# Patient Record
Sex: Male | Born: 1955 | Race: White | Hispanic: No | Marital: Single
Health system: Southern US, Academic
[De-identification: ages and names within clinical notes are randomized; demographics above are authoritative.]

## PROBLEM LIST (undated history)

## (undated) ENCOUNTER — Encounter
Attending: Student in an Organized Health Care Education/Training Program | Primary: Student in an Organized Health Care Education/Training Program

## (undated) ENCOUNTER — Telehealth

## (undated) ENCOUNTER — Ambulatory Visit: Payer: MEDICARE

## (undated) ENCOUNTER — Encounter: Attending: Hematology & Oncology | Primary: Hematology & Oncology

## (undated) ENCOUNTER — Ambulatory Visit

## (undated) ENCOUNTER — Encounter

## (undated) ENCOUNTER — Encounter: Attending: Adult Health | Primary: Adult Health

## (undated) ENCOUNTER — Ambulatory Visit: Payer: MEDICARE | Attending: Pharmacist | Primary: Pharmacist

## (undated) ENCOUNTER — Inpatient Hospital Stay

## (undated) ENCOUNTER — Telehealth: Attending: Dermatology | Primary: Dermatology

## (undated) ENCOUNTER — Telehealth: Attending: Internal Medicine | Primary: Internal Medicine

## (undated) ENCOUNTER — Telehealth: Attending: Registered" | Primary: Registered"

## (undated) ENCOUNTER — Telehealth: Attending: Pharmacist | Primary: Pharmacist

## (undated) ENCOUNTER — Ambulatory Visit
Attending: Student in an Organized Health Care Education/Training Program | Primary: Student in an Organized Health Care Education/Training Program

## (undated) ENCOUNTER — Encounter: Attending: Pharmacist | Primary: Pharmacist

## (undated) ENCOUNTER — Telehealth
Attending: Student in an Organized Health Care Education/Training Program | Primary: Student in an Organized Health Care Education/Training Program

## (undated) ENCOUNTER — Telehealth: Attending: Hematology & Oncology | Primary: Hematology & Oncology

## (undated) ENCOUNTER — Institutional Professional Consult (permissible substitution): Payer: MEDICARE | Attending: Pharmacist | Primary: Pharmacist

## (undated) ENCOUNTER — Ambulatory Visit: Payer: MEDICARE | Attending: Dermatology | Primary: Dermatology

## (undated) DIAGNOSIS — R0600 Dyspnea, unspecified: Secondary | ICD-10-CM

## (undated) DIAGNOSIS — F32A Depression, unspecified: Secondary | ICD-10-CM

## (undated) DIAGNOSIS — G4730 Sleep apnea, unspecified: Secondary | ICD-10-CM

## (undated) DIAGNOSIS — L409 Psoriasis, unspecified: Secondary | ICD-10-CM

## (undated) DIAGNOSIS — I10 Essential (primary) hypertension: Secondary | ICD-10-CM

## (undated) DIAGNOSIS — I519 Heart disease, unspecified: Secondary | ICD-10-CM

## (undated) DIAGNOSIS — F329 Major depressive disorder, single episode, unspecified: Secondary | ICD-10-CM

## (undated) DIAGNOSIS — M1990 Unspecified osteoarthritis, unspecified site: Secondary | ICD-10-CM

## (undated) DIAGNOSIS — J449 Chronic obstructive pulmonary disease, unspecified: Secondary | ICD-10-CM

## (undated) DIAGNOSIS — I219 Acute myocardial infarction, unspecified: Secondary | ICD-10-CM

## (undated) DIAGNOSIS — I2510 Atherosclerotic heart disease of native coronary artery without angina pectoris: Secondary | ICD-10-CM

## (undated) DIAGNOSIS — K219 Gastro-esophageal reflux disease without esophagitis: Secondary | ICD-10-CM

## (undated) DIAGNOSIS — G43909 Migraine, unspecified, not intractable, without status migrainosus: Secondary | ICD-10-CM

## (undated) DIAGNOSIS — J45909 Unspecified asthma, uncomplicated: Secondary | ICD-10-CM

## (undated) HISTORY — PX: KNEE SURGERY: SHX244

## (undated) HISTORY — PX: HEMORRHOID SURGERY: SHX153

## (undated) HISTORY — DX: Atherosclerotic heart disease of native coronary artery without angina pectoris: I25.10

## (undated) HISTORY — PX: OTHER SURGICAL HISTORY: SHX169

## (undated) HISTORY — PX: CHOLECYSTECTOMY: SHX55

## (undated) HISTORY — DX: Psoriasis, unspecified: L40.9

## (undated) HISTORY — DX: Depression, unspecified: F32.A

## (undated) HISTORY — PX: TONSILLECTOMY: SUR1361

## (undated) HISTORY — PX: STENT PLACEMENT VASCULAR (ARMC HX): HXRAD1737

## (undated) HISTORY — PX: EYE SURGERY: SHX253

## (undated) HISTORY — DX: Unspecified osteoarthritis, unspecified site: M19.90

## (undated) HISTORY — PX: TONSILLECTOMY AND ADENOIDECTOMY: SHX28

## (undated) HISTORY — PX: CORONARY ANGIOPLASTY WITH STENT PLACEMENT: SHX49

## (undated) HISTORY — DX: Chronic obstructive pulmonary disease, unspecified: J44.9

## (undated) HISTORY — DX: Major depressive disorder, single episode, unspecified: F32.9

## (undated) HISTORY — DX: Heart disease, unspecified: I51.9

## (undated) HISTORY — PX: APPENDECTOMY: SHX54

## (undated) HISTORY — PX: CATARACT EXTRACTION: SUR2

---
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
## 2006-06-27 ENCOUNTER — Emergency Department: Payer: Self-pay | Admitting: Emergency Medicine

## 2006-07-02 ENCOUNTER — Emergency Department: Payer: Self-pay | Admitting: Emergency Medicine

## 2006-07-16 ENCOUNTER — Emergency Department: Payer: Self-pay | Admitting: Emergency Medicine

## 2006-11-15 ENCOUNTER — Ambulatory Visit: Payer: Self-pay

## 2006-12-09 ENCOUNTER — Emergency Department: Payer: Self-pay

## 2006-12-09 ENCOUNTER — Other Ambulatory Visit: Payer: Self-pay

## 2007-02-11 ENCOUNTER — Emergency Department: Payer: Self-pay | Admitting: Emergency Medicine

## 2007-05-01 ENCOUNTER — Emergency Department: Payer: Self-pay | Admitting: Emergency Medicine

## 2007-08-11 ENCOUNTER — Emergency Department: Payer: Self-pay | Admitting: Emergency Medicine

## 2008-01-23 ENCOUNTER — Other Ambulatory Visit: Payer: Self-pay

## 2008-01-23 ENCOUNTER — Emergency Department: Payer: Self-pay | Admitting: Emergency Medicine

## 2008-02-12 ENCOUNTER — Emergency Department: Payer: Self-pay | Admitting: Emergency Medicine

## 2008-06-26 ENCOUNTER — Emergency Department: Payer: Self-pay | Admitting: Emergency Medicine

## 2008-12-13 ENCOUNTER — Emergency Department: Payer: Self-pay | Admitting: Emergency Medicine

## 2010-04-09 ENCOUNTER — Emergency Department: Payer: Self-pay | Admitting: Emergency Medicine

## 2010-05-04 ENCOUNTER — Emergency Department: Payer: Self-pay | Admitting: Emergency Medicine

## 2010-06-09 DIAGNOSIS — M1990 Unspecified osteoarthritis, unspecified site: Secondary | ICD-10-CM | POA: Insufficient documentation

## 2010-06-09 DIAGNOSIS — R109 Unspecified abdominal pain: Secondary | ICD-10-CM | POA: Insufficient documentation

## 2010-06-09 DIAGNOSIS — E119 Type 2 diabetes mellitus without complications: Secondary | ICD-10-CM | POA: Insufficient documentation

## 2010-06-09 DIAGNOSIS — J45909 Unspecified asthma, uncomplicated: Secondary | ICD-10-CM | POA: Insufficient documentation

## 2010-06-09 DIAGNOSIS — G44309 Post-traumatic headache, unspecified, not intractable: Secondary | ICD-10-CM | POA: Insufficient documentation

## 2010-06-09 DIAGNOSIS — R197 Diarrhea, unspecified: Secondary | ICD-10-CM | POA: Insufficient documentation

## 2010-09-22 DIAGNOSIS — D126 Benign neoplasm of colon, unspecified: Secondary | ICD-10-CM | POA: Insufficient documentation

## 2010-10-24 ENCOUNTER — Emergency Department (HOSPITAL_COMMUNITY)
Admission: EM | Admit: 2010-10-24 | Discharge: 2010-10-24 | Disposition: A | Discharge status: Home / Self Care | Payer: Medicare Other | Attending: Emergency Medicine | Admitting: Emergency Medicine

## 2010-10-24 ENCOUNTER — Emergency Department (HOSPITAL_COMMUNITY): Payer: Medicare Other

## 2010-10-24 DIAGNOSIS — R109 Unspecified abdominal pain: Secondary | ICD-10-CM | POA: Insufficient documentation

## 2010-10-24 DIAGNOSIS — R143 Flatulence: Secondary | ICD-10-CM | POA: Insufficient documentation

## 2010-10-24 DIAGNOSIS — I10 Essential (primary) hypertension: Secondary | ICD-10-CM | POA: Insufficient documentation

## 2010-10-24 DIAGNOSIS — R142 Eructation: Secondary | ICD-10-CM | POA: Insufficient documentation

## 2010-10-24 DIAGNOSIS — Z79899 Other long term (current) drug therapy: Secondary | ICD-10-CM | POA: Insufficient documentation

## 2010-10-24 DIAGNOSIS — E119 Type 2 diabetes mellitus without complications: Secondary | ICD-10-CM | POA: Insufficient documentation

## 2010-10-24 DIAGNOSIS — R141 Gas pain: Secondary | ICD-10-CM | POA: Insufficient documentation

## 2010-10-24 LAB — CBC
HCT: 45.2 % (ref 39.0–52.0)
MCH: 31.5 pg (ref 26.0–34.0)
MCHC: 33.4 g/dL (ref 30.0–36.0)
MCV: 94.2 fL (ref 78.0–100.0)
Platelets: 234 10*3/uL (ref 150–400)
RDW: 13.4 % (ref 11.5–15.5)

## 2010-10-24 LAB — COMPREHENSIVE METABOLIC PANEL
AST: 15 U/L (ref 0–37)
Albumin: 3.9 g/dL (ref 3.5–5.2)
BUN: 12 mg/dL (ref 6–23)
Calcium: 9.2 mg/dL (ref 8.4–10.5)
Creatinine, Ser: 0.79 mg/dL (ref 0.50–1.35)
Total Bilirubin: 0.3 mg/dL (ref 0.3–1.2)
Total Protein: 7.3 g/dL (ref 6.0–8.3)

## 2010-10-24 LAB — DIFFERENTIAL
Eosinophils Absolute: 0.2 10*3/uL (ref 0.0–0.7)
Eosinophils Relative: 2 % (ref 0–5)
Lymphocytes Relative: 25 % (ref 12–46)
Lymphs Abs: 2.3 10*3/uL (ref 0.7–4.0)
Monocytes Absolute: 0.6 10*3/uL (ref 0.1–1.0)

## 2010-10-24 LAB — URINALYSIS, ROUTINE W REFLEX MICROSCOPIC
Bilirubin Urine: NEGATIVE
Nitrite: NEGATIVE
Protein, ur: NEGATIVE mg/dL
Specific Gravity, Urine: 1.026 (ref 1.005–1.030)
Urobilinogen, UA: 0.2 mg/dL (ref 0.0–1.0)

## 2010-10-24 LAB — LIPASE, BLOOD: Lipase: 33 U/L (ref 11–59)

## 2010-10-24 LAB — URINE MICROSCOPIC-ADD ON

## 2011-06-25 ENCOUNTER — Emergency Department: Payer: Self-pay | Admitting: *Deleted

## 2011-06-25 LAB — URINALYSIS, COMPLETE
Glucose,UR: NEGATIVE mg/dL (ref 0–75)
Ketone: NEGATIVE
Nitrite: NEGATIVE
Ph: 6 (ref 4.5–8.0)
Protein: NEGATIVE
RBC,UR: 10 /HPF (ref 0–5)
WBC UR: 1 /HPF (ref 0–5)

## 2011-06-25 LAB — COMPREHENSIVE METABOLIC PANEL
BUN: 7 mg/dL (ref 7–18)
Bilirubin,Total: 0.4 mg/dL (ref 0.2–1.0)
Creatinine: 0.9 mg/dL (ref 0.60–1.30)
EGFR (African American): >60
EGFR (Non-African Amer.): >60
Osmolality: 275 (ref 275–301)
Total Protein: 7.6 g/dL (ref 6.4–8.2)

## 2011-06-25 LAB — CBC
HCT: 43.6 % (ref 40.0–52.0)
HGB: 14.3 g/dL (ref 13.0–18.0)
MCHC: 32.7 g/dL (ref 32.0–36.0)
MCV: 94 fL (ref 80–100)
Platelet: 197 10*3/uL (ref 150–440)
RDW: 15 % — ABNORMAL HIGH (ref 11.5–14.5)

## 2011-08-18 ENCOUNTER — Encounter (HOSPITAL_COMMUNITY): Payer: Self-pay | Admitting: *Deleted

## 2011-08-18 ENCOUNTER — Emergency Department (HOSPITAL_COMMUNITY)
Admission: EM | Admit: 2011-08-18 | Discharge: 2011-08-18 | Disposition: A | Discharge status: Home / Self Care | Payer: Medicare Other | Attending: Emergency Medicine | Admitting: Emergency Medicine

## 2011-08-18 DIAGNOSIS — E119 Type 2 diabetes mellitus without complications: Secondary | ICD-10-CM | POA: Insufficient documentation

## 2011-08-18 DIAGNOSIS — H9201 Otalgia, right ear: Secondary | ICD-10-CM

## 2011-08-18 DIAGNOSIS — H9209 Otalgia, unspecified ear: Secondary | ICD-10-CM | POA: Insufficient documentation

## 2011-08-18 MED ORDER — HYDROCODONE-ACETAMINOPHEN 5-500 MG PO TABS: 1.0000 | ORAL_TABLET | Freq: Four times a day (QID) | ORAL | Status: AC | PRN

## 2011-08-18 NOTE — ED Notes (Signed)
Reports right ear infection, which he has taken antibiotics x 2 for with no relief and its now causing dizziness. No acute distress noted at triage

## 2011-08-18 NOTE — ED Provider Notes (Signed)
History     CSN: [id number]  Arrival date & time 08/18/11  1314   First MD Initiated Contact with Patient 08/18/11 1528      Chief Complaint  Patient presents with  . Otalgia  . Dizziness    (Consider location/radiation/quality/duration/timing/severity/associated sxs/prior treatment) Patient is a 56 y.o. male presenting with ear pain. The history is provided by the patient.  Otalgia This is a new problem. The current episode started more than 1 week ago. There is pain in the right ear. The problem occurs constantly. The problem has not changed since onset.There has been no fever. The pain is at a severity of 8/10. The pain is moderate. Associated symptoms include headaches. Pertinent negatives include no ear discharge, no rhinorrhea, no sore throat, no cough and no rash.  Pt states he has had right ear pain for 3 wks now. Has finished a course of neopmycin ear drops, orla ceftin and amoxicillin, states no improvement. Pain continues. Denies fever, chills, drainage, states hearing affected but still can hear out of that ear. States started feeling dizzy as well. No injury to the ear. No pain on the outside of the ear.   Past Medical History  Diagnosis Date  . Diabetes mellitus     No past surgical history on file.  No family history on file.  History  Substance Use Topics  . Smoking status: Not on file  . Smokeless tobacco: Not on file  . Alcohol Use:       Review of Systems  Constitutional: Negative for fever and chills.  HENT: Positive for ear pain. Negative for sore throat, rhinorrhea and ear discharge.   Eyes: Positive for pain.  Respiratory: Negative for cough.   Cardiovascular: Negative.   Gastrointestinal: Negative.   Musculoskeletal: Negative.   Skin: Negative for rash.  Neurological: Positive for headaches.    Allergies  Review of patient's allergies indicates no known allergies.  Home Medications   Current Outpatient Rx  Name Route Sig Dispense Refill   . ALBUTEROL SULFATE HFA 108 (90 BASE) MCG/ACT IN AERS Inhalation Inhale 2 puffs into the lungs every 4 (four) hours as needed. For shortness of breath    . ASPIRIN 325 MG PO TABS Oral Take 325 mg by mouth daily.    . ATORVASTATIN CALCIUM 20 MG PO TABS Oral Take 20 mg by mouth daily.    Marland Kitchen BENAZEPRIL HCL 20 MG PO TABS Oral Take 20 mg by mouth daily.    . BUDESONIDE-FORMOTEROL FUMARATE 80-4.5 MCG/ACT IN AERO Inhalation Inhale 2 puffs into the lungs 2 (two) times daily.    Marland Kitchen CITALOPRAM HYDROBROMIDE 20 MG PO TABS Oral Take 20 mg by mouth daily.    Marland Kitchen CLOPIDOGREL BISULFATE 75 MG PO TABS Oral Take 75 mg by mouth daily.    . IBUPROFEN 600 MG PO TABS Oral Take 600 mg by mouth every 6 (six) hours as needed. For pain    . ISOSORBIDE MONONITRATE ER 30 MG PO TB24 Oral Take 30 mg by mouth daily.    Marland Kitchen METOPROLOL TARTRATE 25 MG PO TABS Oral Take 25 mg by mouth 2 (two) times daily.    Marland Kitchen NITROGLYCERIN 0.4 MG SL SUBL Sublingual Place 0.4 mg under the tongue every 5 (five) minutes as needed. For chest pain    . TRIAMCINOLONE ACETONIDE 0.1 % EX CREA Topical Apply 1 application topically 2 (two) times daily.      BP 140/65  Pulse 92  Temp(Src) 98.1 F (36.7 C) (Oral)  Resp 16  SpO2 96%  Physical Exam  Nursing note and vitals reviewed. Constitutional: He is oriented to person, place, and time. He appears well-developed and well-nourished. No distress.  HENT:  Head: Normocephalic and atraumatic.  Right Ear: External ear and ear canal normal. Tympanic membrane is injected and erythematous.  Left Ear: Tympanic membrane, external ear and ear canal normal.  Nose: Nose normal.  Mouth/Throat: Uvula is midline.  Eyes: Conjunctivae are normal. Pupils are equal, round, and reactive to light.  Neck: Neck supple.  Cardiovascular: Normal rate, regular rhythm and normal heart sounds.   Pulmonary/Chest: Effort normal and breath sounds normal. No respiratory distress. He has no wheezes.  Musculoskeletal: Normal range of  motion.  Neurological: He is alert and oriented to person, place, and time. He displays normal reflexes. No cranial nerve deficit. Coordination normal.       Normal coordination. Negative romberg  Skin: Skin is warm and dry.  Psychiatric: He has a normal mood and affect.    ED Course  Procedures (including critical care time)  Pt with chronic right otitis media. Has finished topical and two rounds of antibiotics. Pt otherwise nontoxic. Normal neurological exam. I believe pt needs to be seen by an ENT for further evaluation. No signs of mastoiditis. Will d/c with ent referral   1. Right ear pain       MDM          Lottie Mussel, PA 08/18/11 1555

## 2011-08-18 NOTE — Discharge Instructions (Signed)
Ibuprofen for pain. Vicodin for severe pain as prescribed. Call number given for ENT appointment. Follow up with them. Tell them you were referred from Emergency Department.   Otalgia The most common reason for this in children is an infection of the middle ear. Pain from the middle ear is usually caused by a build-up of fluid and pressure behind the eardrum. Pain from an earache can be sharp, dull, or burning. The pain may be temporary or constant. The middle ear is connected to the nasal passages by a short narrow tube called the Eustachian tube. The Eustachian tube allows fluid to drain out of the middle ear, and helps keep the pressure in your ear equalized. CAUSES  A cold or allergy can block the Eustachian tube with inflammation and the build-up of secretions. This is especially likely in small children, because their Eustachian tube is shorter and more horizontal. When the Eustachian tube closes, the normal flow of fluid from the middle ear is stopped. Fluid can accumulate and cause stuffiness, pain, hearing loss, and an ear infection if germs start growing in this area. SYMPTOMS  The symptoms of an ear infection may include fever, ear pain, fussiness, increased crying, and irritability. Many children will have temporary and minor hearing loss during and right after an ear infection. Permanent hearing loss is rare, but the risk increases the more infections a child has. Other causes of ear pain include retained water in the outer ear canal from swimming and bathing. Ear pain in adults is less likely to be from an ear infection. Ear pain may be referred from other locations. Referred pain may be from the joint between your jaw and the skull. It may also come from a tooth problem or problems in the neck. Other causes of ear pain include:  A foreign body in the ear.   Outer ear infection.   Sinus infections.   Impacted ear wax.   Ear injury.   Arthritis of the jaw or TMJ problems.   Middle  ear infection.   Tooth infections.   Sore throat with pain to the ears.  DIAGNOSIS  Your caregiver can usually make the diagnosis by examining you. Sometimes other special studies, including x-rays and lab work may be necessary. TREATMENT   If antibiotics were prescribed, use them as directed and finish them even if you or your child's symptoms seem to be improved.   Sometimes PE tubes are needed in children. These are little plastic tubes which are put into the eardrum during a simple surgical procedure. They allow fluid to drain easier and allow the pressure in the middle ear to equalize. This helps relieve the ear pain caused by pressure changes.  HOME CARE INSTRUCTIONS   Only take over-the-counter or prescription medicines for pain, discomfort, or fever as directed by your caregiver. DO NOT GIVE CHILDREN ASPIRIN because of the association of Reye's Syndrome in children taking aspirin.   Use a cold pack applied to the outer ear for 15 to 20 minutes, 3 to 4 times per day or as needed may reduce pain. Do not apply ice directly to the skin. You may cause frost bite.   Over-the-counter ear drops used as directed may be effective. Your caregiver may sometimes prescribe ear drops.   Resting in an upright position may help reduce pressure in the middle ear and relieve pain.   Ear pain caused by rapidly descending from high altitudes can be relieved by swallowing or chewing gum. Allowing infants to suck  on a bottle during airplane travel can help.   Do not smoke in the house or near children. If you are unable to quit smoking, smoke outside.   Control allergies.  SEEK IMMEDIATE MEDICAL CARE IF:   You or your child are becoming sicker.   Pain or fever relief is not obtained with medicine.   You or your child's symptoms (pain, fever, or irritability) do not improve within 24 to 48 hours or as instructed.   Severe pain suddenly stops hurting. This may indicate a ruptured eardrum.   You  or your children develop new problems such as severe headaches, stiff neck, difficulty swallowing, or swelling of the face or around the ear.  Document Released: 12/10/2003 Document Revised: 04/13/2011 Document Reviewed: 04/15/2008 [facility] Patient Information 2012 [place], [place].

## 2011-08-18 NOTE — ED Provider Notes (Signed)
Medical screening examination/treatment/procedure(s) were performed by non-physician practitioner and as supervising physician I was immediately available for consultation/collaboration.   Benny Lennert, MD 08/18/11 [phone]

## 2011-09-06 ENCOUNTER — Emergency Department: Payer: Self-pay

## 2011-09-13 ENCOUNTER — Ambulatory Visit: Payer: Self-pay | Admitting: Otolaryngology

## 2011-12-23 ENCOUNTER — Emergency Department: Payer: Self-pay | Admitting: Unknown Physician Specialty

## 2011-12-23 LAB — CBC WITH DIFFERENTIAL/PLATELET
Basophil %: 0.8 %
HCT: 46.2 % (ref 40.0–52.0)
HGB: 15 g/dL (ref 13.0–18.0)
Lymphocyte %: 23.8 %
MCH: 30.3 pg (ref 26.0–34.0)
MCHC: 32.4 g/dL (ref 32.0–36.0)
Monocyte #: 0.8 x10 3/mm (ref 0.2–1.0)
Neutrophil #: 9.7 10*3/uL — ABNORMAL HIGH (ref 1.4–6.5)

## 2012-03-22 ENCOUNTER — Emergency Department: Payer: Self-pay | Admitting: Emergency Medicine

## 2012-07-05 DIAGNOSIS — L408 Other psoriasis: Secondary | ICD-10-CM | POA: Insufficient documentation

## 2012-07-05 DIAGNOSIS — J309 Allergic rhinitis, unspecified: Secondary | ICD-10-CM | POA: Insufficient documentation

## 2012-07-08 DIAGNOSIS — Z8249 Family history of ischemic heart disease and other diseases of the circulatory system: Secondary | ICD-10-CM | POA: Insufficient documentation

## 2012-08-02 DIAGNOSIS — I10 Essential (primary) hypertension: Secondary | ICD-10-CM | POA: Insufficient documentation

## 2012-08-02 DIAGNOSIS — J449 Chronic obstructive pulmonary disease, unspecified: Secondary | ICD-10-CM | POA: Insufficient documentation

## 2012-08-02 DIAGNOSIS — J4489 Other specified chronic obstructive pulmonary disease: Secondary | ICD-10-CM | POA: Insufficient documentation

## 2012-08-02 DIAGNOSIS — F329 Major depressive disorder, single episode, unspecified: Secondary | ICD-10-CM | POA: Insufficient documentation

## 2012-09-02 DIAGNOSIS — I2510 Atherosclerotic heart disease of native coronary artery without angina pectoris: Secondary | ICD-10-CM | POA: Insufficient documentation

## 2012-09-02 DIAGNOSIS — H9209 Otalgia, unspecified ear: Secondary | ICD-10-CM | POA: Insufficient documentation

## 2012-09-25 DIAGNOSIS — H60399 Other infective otitis externa, unspecified ear: Secondary | ICD-10-CM | POA: Insufficient documentation

## 2012-12-12 DIAGNOSIS — R079 Chest pain, unspecified: Secondary | ICD-10-CM | POA: Insufficient documentation

## 2012-12-12 DIAGNOSIS — R0600 Dyspnea, unspecified: Secondary | ICD-10-CM | POA: Insufficient documentation

## 2013-04-02 DIAGNOSIS — E782 Mixed hyperlipidemia: Secondary | ICD-10-CM | POA: Insufficient documentation

## 2013-04-02 DIAGNOSIS — I10 Essential (primary) hypertension: Secondary | ICD-10-CM | POA: Insufficient documentation

## 2014-02-09 DIAGNOSIS — R609 Edema, unspecified: Secondary | ICD-10-CM | POA: Insufficient documentation

## 2014-05-18 DIAGNOSIS — I10 Essential (primary) hypertension: Secondary | ICD-10-CM | POA: Insufficient documentation

## 2014-09-15 LAB — HM HIV SCREENING LAB: HM HIV SCREENING: NEGATIVE

## 2016-01-31 DIAGNOSIS — I208 Other forms of angina pectoris: Secondary | ICD-10-CM | POA: Insufficient documentation

## 2016-11-05 ENCOUNTER — Emergency Department
Admission: EM | Admit: 2016-11-05 | Discharge: 2016-11-05 | Disposition: A | Discharge status: Home / Self Care | Source: Intra-hospital

## 2016-11-05 MED ORDER — BUTALBITAL-ACETAMINOPHEN-CAFFEINE 50 MG-300 MG-40 MG CAPSULE
ORAL_CAPSULE | Freq: Four times a day (QID) | ORAL | 0 refills | 0 days | Status: CP | PRN
Start: 2016-11-05 — End: ?

## 2016-11-09 ENCOUNTER — Emergency Department
Admission: EM | Admit: 2016-11-09 | Discharge: 2016-11-09 | Disposition: A | Discharge status: Home / Self Care | Payer: MEDICARE | Source: Intra-hospital

## 2016-11-09 ENCOUNTER — Emergency Department
Admission: EM | Admit: 2016-11-09 | Discharge: 2016-11-09 | Disposition: A | Discharge status: Home / Self Care | Source: Intra-hospital

## 2016-11-09 DIAGNOSIS — R079 Chest pain, unspecified: Principal | ICD-10-CM

## 2016-11-09 MED ORDER — PREDNISONE 20 MG TABLET
ORAL_TABLET | Freq: Every day | ORAL | 0 refills | 0.00000 days | Status: CP
Start: 2016-11-09 — End: 2016-11-16

## 2016-11-09 MED ORDER — AZITHROMYCIN 250 MG TABLET
PACK | Freq: Every day | ORAL | 0 refills | 0.00000 days | Status: CP
Start: 2016-11-09 — End: 2016-11-14

## 2016-11-17 ENCOUNTER — Emergency Department
Admission: EM | Admit: 2016-11-17 | Discharge: 2016-11-17 | Disposition: A | Discharge status: Home / Self Care | Payer: MEDICARE | Source: Intra-hospital | Attending: Emergency Medicine | Admitting: Emergency Medicine

## 2016-11-17 MED ORDER — KETOROLAC 10 MG TABLET
ORAL_TABLET | Freq: Four times a day (QID) | ORAL | 0 refills | 0 days | Status: CP | PRN
Start: 2016-11-17 — End: 2016-11-22

## 2016-11-18 ENCOUNTER — Emergency Department
Admission: EM | Admit: 2016-11-18 | Discharge: 2016-11-18 | Disposition: A | Discharge status: Home / Self Care | Source: Intra-hospital

## 2016-11-18 ENCOUNTER — Emergency Department
Admission: EM | Admit: 2016-11-18 | Discharge: 2016-11-18 | Disposition: A | Discharge status: Home / Self Care | Payer: MEDICARE | Source: Intra-hospital

## 2016-11-18 DIAGNOSIS — R0789 Other chest pain: Principal | ICD-10-CM

## 2016-11-30 ENCOUNTER — Emergency Department
Admission: EM | Admit: 2016-11-30 | Discharge: 2016-12-01 | Disposition: A | Discharge status: Home / Self Care | Payer: MEDICARE | Source: Intra-hospital

## 2016-11-30 DIAGNOSIS — R109 Unspecified abdominal pain: Principal | ICD-10-CM

## 2016-12-01 MED ORDER — IBUPROFEN 600 MG TABLET
ORAL_TABLET | Freq: Four times a day (QID) | ORAL | 0 refills | 0.00000 days | Status: CP | PRN
Start: 2016-12-01 — End: ?

## 2016-12-12 ENCOUNTER — Encounter: Payer: Self-pay | Admitting: Internal Medicine

## 2016-12-13 ENCOUNTER — Emergency Department
Admission: EM | Admit: 2016-12-13 | Discharge: 2016-12-13 | Disposition: A | Discharge status: Home / Self Care | Source: Intra-hospital

## 2016-12-13 ENCOUNTER — Emergency Department
Admission: EM | Admit: 2016-12-13 | Discharge: 2016-12-13 | Disposition: A | Discharge status: Home / Self Care | Payer: MEDICARE | Source: Intra-hospital

## 2016-12-13 DIAGNOSIS — M25561 Pain in right knee: Principal | ICD-10-CM

## 2017-03-11 DIAGNOSIS — H2511 Age-related nuclear cataract, right eye: Principal | ICD-10-CM

## 2017-03-12 ENCOUNTER — Ambulatory Visit
Admission: RE | Admit: 2017-03-12 | Discharge: 2017-03-12 | Disposition: A | Discharge status: Home / Self Care | Payer: MEDICARE | Attending: Ophthalmology | Admitting: Ophthalmology

## 2017-03-12 ENCOUNTER — Ambulatory Visit
Admission: RE | Admit: 2017-03-12 | Discharge: 2017-03-12 | Disposition: A | Discharge status: Home / Self Care | Payer: MEDICARE | Attending: Anesthesiology | Admitting: Anesthesiology

## 2017-03-12 DIAGNOSIS — H2511 Age-related nuclear cataract, right eye: Principal | ICD-10-CM

## 2017-04-02 MED ORDER — SPIRONOLACTONE 25 MG TABLET
ORAL_TABLET | 1 refills | 0 days | Status: CP
Start: 2017-04-02 — End: 2017-04-28

## 2017-04-02 MED ORDER — FUROSEMIDE 40 MG TABLET
ORAL_TABLET | 2 refills | 0 days | Status: CP
Start: 2017-04-02 — End: 2017-08-13

## 2017-04-02 MED ORDER — AMLODIPINE 5 MG TABLET
ORAL_TABLET | Freq: Every day | ORAL | 2 refills | 0.00000 days | Status: CP
Start: 2017-04-02 — End: 2017-08-13

## 2017-04-07 ENCOUNTER — Emergency Department
Admission: EM | Admit: 2017-04-07 | Discharge: 2017-04-07 | Disposition: A | Discharge status: Home / Self Care | Source: Intra-hospital

## 2017-04-07 MED ORDER — CYCLOBENZAPRINE 5 MG TABLET
ORAL_TABLET | Freq: Every evening | ORAL | 0 refills | 0.00000 days | Status: CP | PRN
Start: 2017-04-07 — End: ?

## 2017-04-14 ENCOUNTER — Other Ambulatory Visit: Payer: Self-pay

## 2017-04-14 ENCOUNTER — Emergency Department
Admission: EM | Admit: 2017-04-14 | Discharge: 2017-04-14 | Disposition: A | Discharge status: Home / Self Care | Payer: Medicare Other | Attending: Emergency Medicine | Admitting: Emergency Medicine

## 2017-04-14 ENCOUNTER — Encounter: Payer: Self-pay | Admitting: Emergency Medicine

## 2017-04-14 ENCOUNTER — Emergency Department: Payer: Medicare Other

## 2017-04-14 DIAGNOSIS — Z79899 Other long term (current) drug therapy: Secondary | ICD-10-CM | POA: Diagnosis not present

## 2017-04-14 DIAGNOSIS — R51 Headache: Secondary | ICD-10-CM | POA: Diagnosis not present

## 2017-04-14 DIAGNOSIS — Z7982 Long term (current) use of aspirin: Secondary | ICD-10-CM | POA: Insufficient documentation

## 2017-04-14 DIAGNOSIS — E119 Type 2 diabetes mellitus without complications: Secondary | ICD-10-CM | POA: Insufficient documentation

## 2017-04-14 DIAGNOSIS — Z7902 Long term (current) use of antithrombotics/antiplatelets: Secondary | ICD-10-CM | POA: Diagnosis not present

## 2017-04-14 DIAGNOSIS — R519 Headache, unspecified: Secondary | ICD-10-CM

## 2017-04-14 DIAGNOSIS — Z87891 Personal history of nicotine dependence: Secondary | ICD-10-CM | POA: Insufficient documentation

## 2017-04-14 HISTORY — DX: Migraine, unspecified, not intractable, without status migrainosus: G43.909

## 2017-04-14 MED ORDER — MELOXICAM 15 MG PO TABS: 15.0000 mg | ORAL_TABLET | Freq: Every day | ORAL | 2 refills | Status: DC

## 2017-04-14 MED ORDER — ONDANSETRON HCL 4 MG/2ML IJ SOLN
4.0000 mg | Freq: Once | INTRAMUSCULAR | Status: AC
  Administered 2017-04-14: 4 mg via INTRAVENOUS
  Filled 2017-04-14: qty 2

## 2017-04-14 MED ORDER — KETOROLAC TROMETHAMINE 30 MG/ML IJ SOLN
30.0000 mg | Freq: Once | INTRAMUSCULAR | Status: AC
  Administered 2017-04-14: 30 mg via INTRAVENOUS
  Filled 2017-04-14: qty 1

## 2017-04-14 MED ORDER — SODIUM CHLORIDE 0.9 % IV BOLUS (SEPSIS)
1000.0000 mL | Freq: Once | INTRAVENOUS | Status: AC
  Administered 2017-04-14: 1000 mL via INTRAVENOUS

## 2017-04-14 NOTE — ED Provider Notes (Signed)
[facility] Emergency Department Provider Note  ____________________________________________   First MD Initiated Contact with Patient 04/14/17 1327     (approximate)  I have reviewed the triage vital signs and the nursing notes.   HISTORY  Chief Complaint Migraine    HPI Nicholas Reyes is a 61 y.o. male complains of a headache, states that his headache is the worst he has ever had, states he took Excedrin without any relief, denies nausea, vomiting, photophobia, or sensitivity to noise  Past Medical History:  Diagnosis Date  . Diabetes mellitus   . Migraines     There are no active problems to display for this patient.   History reviewed. No pertinent surgical history.  Prior to Admission medications   Medication Sig Start Date End Date Taking? Authorizing Provider  albuterol (PROVENTIL HFA;VENTOLIN HFA) 108 (90 BASE) MCG/ACT inhaler Inhale 2 puffs into the lungs every 4 (four) hours as needed. For shortness of breath    [provider]  aspirin 325 MG tablet Take 325 mg by mouth daily.    [provider]  atorvastatin (LIPITOR) 20 MG tablet Take 20 mg by mouth daily.    [provider]  benazepril (LOTENSIN) 20 MG tablet Take 20 mg by mouth daily.    [provider]  budesonide-formoterol (SYMBICORT) 80-4.5 MCG/ACT inhaler Inhale 2 puffs into the lungs 2 (two) times daily.    [provider]  citalopram (CELEXA) 20 MG tablet Take 20 mg by mouth daily.    [provider]  clopidogrel (PLAVIX) 75 MG tablet Take 75 mg by mouth daily.    [provider]  ibuprofen (ADVIL,MOTRIN) 600 MG tablet Take 600 mg by mouth every 6 (six) hours as needed. For pain    [provider]  isosorbide mononitrate (IMDUR) 30 MG 24 hr tablet Take 30 mg by mouth daily.    [provider]  meloxicam (MOBIC) 15 MG tablet Take 1 tablet (15 mg total) by mouth daily. 04/14/17 04/14/18  Versie Starks, PA  metoprolol tartrate (LOPRESSOR) 25 MG tablet Take 25 mg by mouth 2 (two) times daily.    [provider]  nitroGLYCERIN (NITROSTAT) 0.4 MG SL tablet Place 0.4 mg under the tongue every 5 (five) minutes as needed. For chest pain    [provider]  triamcinolone cream (KENALOG) 0.1 % Apply 1 application topically 2 (two) times daily.    [provider]    Allergies Patient has no known allergies.  No family history on file.  Social History Social History   Tobacco Use  . Smoking status: Former Smoker  Substance Use Topics  . Alcohol use: Not on file  . Drug use: Not on file    Review of Systems  Constitutional: No fever/chills positive for headache Eyes: No visual changes. ENT: No sore throat. Respiratory: Denies cough Genitourinary: Negative for dysuria. Musculoskeletal: Negative for back pain. Skin: Negative for rash.    ____________________________________________   PHYSICAL EXAM:  VITAL SIGNS: ED Triage Vitals [04/14/17 1311]  Enc Vitals Group     BP 128/64     Pulse Rate 81     Resp 20     Temp 97.9 F (36.6 C)     Temp Source Oral     SpO2 94 %     Weight      Height      Head Circumference      Peak Flow      Pain  Score 8     Pain Loc      Pain Edu?      Excl. in [place]?     Constitutional: Alert and oriented. Well appearing and in no acute distress.  Is able to answer all questions in a normal manner Eyes: Conjunctivae are normal. perrl Head: Atraumatic. Nose: No congestion/rhinnorhea. Mouth/Throat: Mucous membranes are moist.  Throat is normal Cardiovascular: Normal rate, regular rhythm.  Heart sounds are normal Respiratory: Normal respiratory effort.  No retractions lungs are clear to auscultation GU: deferred Musculoskeletal: FROM all extremities, warm and well perfused Neurologic:  Normal speech and language.  Cranial nerves II through XII are grossly intact CT of the head is negative Skin:  Skin is warm, dry  and intact. No rash noted. Psychiatric: Mood and affect are normal. Speech and behavior are normal.  ____________________________________________   LABS (all labs ordered are listed, but only abnormal results are displayed)  Labs Reviewed - No data to display ____________________________________________   ____________________________________________  RADIOLOGY  CT of the head is negative  ____________________________________________   PROCEDURES  Procedure(s) performed: IV normal saline bolus of 1000 ml, Zofran 4 mg, and Toradol 30 mg after the CT was normal,     ____________________________________________   INITIAL IMPRESSION / ASSESSMENT AND PLAN / ED COURSE  Pertinent labs & imaging results that were available during my care of the patient were reviewed by me and considered in my medical decision making (see chart for details).  Patient is a 61 year old male who does not appear in any distress, he is complaining of the worst headache he has ever had, however the patient is watching TV with the lights fully on and appears in no distress, CT of the head is negative, an IV of normal saline, Zofran, and Toradol 30 mg was given, the patient states he had some pain relief, he was discharged in stable condition with headache information, a prescription for meloxicam 15 mg daily, patient is to return if he is worsening, he is to follow-up with his regular doctor if any other problems, patient states he understands and will comply with treatment plan      ____________________________________________   FINAL CLINICAL IMPRESSION(S) / ED DIAGNOSES  Final diagnoses:  Bad headache      NEW MEDICATIONS STARTED DURING THIS VISIT:  This SmartLink is deprecated. Use AVSMEDLIST instead to display the medication list for a patient.   Note:  This document was prepared using Dragon voice recognition software and may include unintentional dictation errors.    Versie Starks,  PA 04/14/17 3220    Darel Hong, MD 04/15/17 [phone]

## 2017-04-14 NOTE — Discharge Instructions (Signed)
Follow-up with your regular doctor or the acute care if your headache returns, use medication as prescribed, you may also take Tylenol as needed for pain, drink plenty of fluids this will help prevent headaches, return to the emergency department if you are worsening, your CT of the head was negative

## 2017-04-14 NOTE — ED Triage Notes (Signed)
Migraine since 9p last night. States history of migraines. No head injury.

## 2017-04-14 NOTE — ED Notes (Signed)
Patient transported to CT 

## 2017-04-14 NOTE — ED Notes (Signed)
Iv in right ac d/c cath intact, no redness or infiltration noted.  No swelling.  Bleeding controlled at this time with bandage.  pt tolerated well.

## 2017-04-30 ENCOUNTER — Emergency Department
Admission: EM | Admit: 2017-04-30 | Discharge: 2017-04-30 | Disposition: A | Discharge status: Home / Self Care | Payer: Medicare Other | Attending: Emergency Medicine | Admitting: Emergency Medicine

## 2017-04-30 ENCOUNTER — Encounter: Payer: Self-pay | Admitting: Emergency Medicine

## 2017-04-30 ENCOUNTER — Other Ambulatory Visit: Payer: Self-pay

## 2017-04-30 DIAGNOSIS — Z87891 Personal history of nicotine dependence: Secondary | ICD-10-CM | POA: Diagnosis not present

## 2017-04-30 DIAGNOSIS — Z79899 Other long term (current) drug therapy: Secondary | ICD-10-CM | POA: Insufficient documentation

## 2017-04-30 DIAGNOSIS — Z7982 Long term (current) use of aspirin: Secondary | ICD-10-CM | POA: Insufficient documentation

## 2017-04-30 DIAGNOSIS — Z7902 Long term (current) use of antithrombotics/antiplatelets: Secondary | ICD-10-CM | POA: Diagnosis not present

## 2017-04-30 DIAGNOSIS — H9201 Otalgia, right ear: Secondary | ICD-10-CM | POA: Diagnosis present

## 2017-04-30 DIAGNOSIS — I10 Essential (primary) hypertension: Secondary | ICD-10-CM | POA: Diagnosis not present

## 2017-04-30 DIAGNOSIS — E119 Type 2 diabetes mellitus without complications: Secondary | ICD-10-CM | POA: Insufficient documentation

## 2017-04-30 DIAGNOSIS — H6121 Impacted cerumen, right ear: Secondary | ICD-10-CM | POA: Diagnosis not present

## 2017-04-30 HISTORY — DX: Essential (primary) hypertension: I10

## 2017-04-30 MED ORDER — ACETAMINOPHEN 500 MG PO TABS
1000.0000 mg | ORAL_TABLET | Freq: Once | ORAL | Status: AC
  Administered 2017-04-30: 1000 mg via ORAL
  Filled 2017-04-30: qty 2

## 2017-04-30 MED ORDER — DOCUSATE SODIUM 50 MG/5ML PO LIQD
50.0000 mg | Freq: Once | ORAL | Status: AC
  Administered 2017-04-30: 50 mg via ORAL
  Filled 2017-04-30: qty 10

## 2017-04-30 NOTE — ED Notes (Signed)
Right irrigated with 46mls warm water  Tolerated well  2 small amounts of wax removed

## 2017-04-30 NOTE — ED Triage Notes (Signed)
Pt reports right ear pain that began yesterday. Reports headache but denies any other symptoms. Ambulatory to triage. No apparent distress noted.

## 2017-04-30 NOTE — Discharge Instructions (Signed)
Follow-up with [facility]  clinic or your regular doctor if any continued problems.

## 2017-04-30 NOTE — ED Notes (Signed)
See triage note  Presents with pain to right ear  States pain started about 2 am

## 2017-04-30 NOTE — ED Provider Notes (Signed)
[facility] Emergency Department Provider Note  ____________________________________________   First MD Initiated Contact with Patient 04/30/17 1152     (approximate)  I have reviewed the triage vital signs and the nursing notes.   HISTORY  Chief Complaint Otalgia   HPI EMMERICH CRYER is a 61 y.o. male his complaint of right ear pain that began yesterday. Patient reports a headache with it but denies any other symptoms. He denies any fever or chills. There's been no reported upper respiratory symptoms. He also states that his hearing has decreased since his ear began hurting. He rates his pain as 10 over 10.   Past Medical History:  Diagnosis Date  . Diabetes mellitus   . Hypertension   . Migraines     There are no active problems to display for this patient.   No past surgical history on file.  Prior to Admission medications   Medication Sig Start Date End Date Taking? Authorizing Provider  albuterol (PROVENTIL HFA;VENTOLIN HFA) 108 (90 BASE) MCG/ACT inhaler Inhale 2 puffs into the lungs every 4 (four) hours as needed. For shortness of breath    [provider]  aspirin 325 MG tablet Take 325 mg by mouth daily.    [provider]  atorvastatin (LIPITOR) 20 MG tablet Take 20 mg by mouth daily.    [provider]  benazepril (LOTENSIN) 20 MG tablet Take 20 mg by mouth daily.    [provider]  budesonide-formoterol (SYMBICORT) 80-4.5 MCG/ACT inhaler Inhale 2 puffs into the lungs 2 (two) times daily.    [provider]  citalopram (CELEXA) 20 MG tablet Take 20 mg by mouth daily.    [provider]  clopidogrel (PLAVIX) 75 MG tablet Take 75 mg by mouth daily.    [provider]  ibuprofen (ADVIL,MOTRIN) 600 MG tablet Take 600 mg by mouth every 6 (six) hours as needed. For pain    [provider]  isosorbide mononitrate (IMDUR) 30 MG 24 hr tablet Take 30 mg by mouth daily.     [provider]  metoprolol tartrate (LOPRESSOR) 25 MG tablet Take 25 mg by mouth 2 (two) times daily.    [provider]  nitroGLYCERIN (NITROSTAT) 0.4 MG SL tablet Place 0.4 mg under the tongue every 5 (five) minutes as needed. For chest pain    [provider]  triamcinolone cream (KENALOG) 0.1 % Apply 1 application topically 2 (two) times daily.    [provider]    Allergies Patient has no known allergies.  No family history on file.  Social History Social History   Tobacco Use  . Smoking status: Former Smoker  Substance Use Topics  . Alcohol use: Not on file  . Drug use: Not on file    Review of Systems Constitutional: No fever/chills Eyes: No visual changes. ENT: No sore throat. Positive for right ear pain. Cardiovascular: Denies chest pain. Respiratory: Denies shortness of breath. ____________________________________________   PHYSICAL EXAM:  VITAL SIGNS: ED Triage Vitals  Enc Vitals Group     BP 04/30/17 1139 133/79     Pulse Rate 04/30/17 1139 80     Resp 04/30/17 1139 18     Temp 04/30/17 1139 97.6 F (36.4 C)     Temp Source 04/30/17 1139 Oral     SpO2 04/30/17 1139 93 %     Weight 04/30/17 1140 240 lb (108.9 kg)     Height 04/30/17 1140 5\' 7"  (1.702 m)  Head Circumference --      Peak Flow --      Pain Score 04/30/17 1139 10     Pain Loc --      Pain Edu? --      Excl. in [place]? --     Constitutional: Alert and oriented. Well appearing and in no acute distress. Eyes: Conjunctivae are normal.  Head: Atraumatic. Nose: No congestion/rhinnorhea.  Left EAC and TM are normal. Right EAC is occluded with cerumen. Mouth/Throat: Mucous membranes are moist.  Oropharynx non-erythematous. Neck: No stridor.   Hematological/Lymphatic/Immunilogical: No cervical lymphadenopathy. Cardiovascular: Normal rate, regular rhythm. Grossly normal heart sounds.  Good peripheral circulation. Respiratory: Normal respiratory effort.  No  retractions. Lungs CTAB. Neurologic:  Normal speech and language. No gross focal neurologic deficits are appreciated. No gait instability. Skin:  Skin is warm, dry and intact. No rash noted. Psychiatric: Mood and affect are normal. Speech and behavior are normal.  ____________________________________________   LABS (all labs ordered are listed, but only abnormal results are displayed)  Labs Reviewed - No data to display  PROCEDURES  Procedure(s) performed: None  Procedures  Critical Care performed: No  ____________________________________________   INITIAL IMPRESSION / ASSESSMENT AND PLAN / ED COURSE Ear canal was cleaned and patient states that he can't hear much better and the pain has improved. He was given Tylenol prior to his discharge. He'll follow up with his PCP if any continued problems.  ____________________________________________   FINAL CLINICAL IMPRESSION(S) / ED DIAGNOSES  Final diagnoses:  Impacted cerumen of right ear     ED Discharge Orders    None       Note:  This document was prepared using Dragon voice recognition software and may include unintentional dictation errors.    Johnn Hai, PA-C 04/30/17 Ewing, Randall An, MD 04/30/17 [phone]

## 2017-05-02 MED ORDER — CLOPIDOGREL 75 MG TABLET
ORAL_TABLET | Freq: Every day | ORAL | 3 refills | 0.00000 days | Status: CP
Start: 2017-05-02 — End: ?

## 2017-05-02 MED ORDER — SPIRONOLACTONE 25 MG TABLET
ORAL_TABLET | Freq: Every day | ORAL | 3 refills | 0 days | Status: CP
Start: 2017-05-02 — End: ?

## 2017-05-07 ENCOUNTER — Emergency Department
Admission: EM | Admit: 2017-05-07 | Discharge: 2017-05-07 | Disposition: A | Discharge status: Home / Self Care | Payer: Medicare Other | Attending: Emergency Medicine | Admitting: Emergency Medicine

## 2017-05-07 ENCOUNTER — Other Ambulatory Visit: Payer: Self-pay

## 2017-05-07 DIAGNOSIS — I10 Essential (primary) hypertension: Secondary | ICD-10-CM | POA: Diagnosis not present

## 2017-05-07 DIAGNOSIS — Z7902 Long term (current) use of antithrombotics/antiplatelets: Secondary | ICD-10-CM | POA: Insufficient documentation

## 2017-05-07 DIAGNOSIS — Z7982 Long term (current) use of aspirin: Secondary | ICD-10-CM | POA: Insufficient documentation

## 2017-05-07 DIAGNOSIS — H9201 Otalgia, right ear: Secondary | ICD-10-CM | POA: Diagnosis present

## 2017-05-07 DIAGNOSIS — Z87891 Personal history of nicotine dependence: Secondary | ICD-10-CM | POA: Insufficient documentation

## 2017-05-07 DIAGNOSIS — E119 Type 2 diabetes mellitus without complications: Secondary | ICD-10-CM | POA: Insufficient documentation

## 2017-05-07 DIAGNOSIS — Z79899 Other long term (current) drug therapy: Secondary | ICD-10-CM | POA: Diagnosis not present

## 2017-05-07 DIAGNOSIS — H60391 Other infective otitis externa, right ear: Secondary | ICD-10-CM

## 2017-05-07 MED ORDER — ACETAMINOPHEN 325 MG PO TABS
650.0000 mg | ORAL_TABLET | Freq: Once | ORAL | Status: AC
  Administered 2017-05-07: 650 mg via ORAL
  Filled 2017-05-07: qty 2

## 2017-05-07 MED ORDER — AMOXICILLIN-POT CLAVULANATE 875-125 MG PO TABS
1.0000 | ORAL_TABLET | Freq: Once | ORAL | Status: AC
  Administered 2017-05-07: 1 via ORAL
  Filled 2017-05-07: qty 1

## 2017-05-07 MED ORDER — CIPROFLOXACIN-DEXAMETHASONE 0.3-0.1 % OT SUSP
4.0000 [drp] | Freq: Once | OTIC | Status: AC
  Administered 2017-05-07: 4 [drp] via OTIC
  Filled 2017-05-07: qty 7.5

## 2017-05-07 MED ORDER — AMOXICILLIN-POT CLAVULANATE 875-125 MG PO TABS: 1.0000 | ORAL_TABLET | Freq: Two times a day (BID) | ORAL | 0 refills | Status: AC

## 2017-05-07 MED ORDER — IBUPROFEN 800 MG PO TABS
800.0000 mg | ORAL_TABLET | Freq: Once | ORAL | Status: AC
  Administered 2017-05-07: 800 mg via ORAL
  Filled 2017-05-07: qty 1

## 2017-05-07 NOTE — ED Notes (Signed)
Pt reports sharp right ear pain and decreased hearing since Saturday;

## 2017-05-07 NOTE — ED Triage Notes (Signed)
Pt states that he has been unable to hear out of his rt ear since Saturday, pt c/o pain

## 2017-05-07 NOTE — ED Provider Notes (Signed)
[facility] Emergency Department Provider Note __   First MD Initiated Contact with Patient 05/07/17 [phone]     (approximate)  I have reviewed the triage vital signs and the nursing notes.   HISTORY  Chief Complaint Otalgia    HPI Nicholas Reyes is a 61 y.o. male below list of chronic medical conditions presents to the emergency department with right ear pain and decreased hearing times 2 days.  Patient was seen in the emergency department on 04/30/2017 for similar complaint at which time cerumen impaction was noted requiring cleaning.  Patient denies any fever no nausea or vomiting.   Past Medical History:  Diagnosis Date  . Diabetes mellitus   . Hypertension   . Migraines     There are no active problems to display for this patient.   No past surgical history on file.  Prior to Admission medications   Medication Sig Start Date End Date Taking? Authorizing Provider  albuterol (PROVENTIL HFA;VENTOLIN HFA) 108 (90 BASE) MCG/ACT inhaler Inhale 2 puffs into the lungs every 4 (four) hours as needed. For shortness of breath    [provider]  aspirin 325 MG tablet Take 325 mg by mouth daily.    [provider]  atorvastatin (LIPITOR) 20 MG tablet Take 20 mg by mouth daily.    [provider]  benazepril (LOTENSIN) 20 MG tablet Take 20 mg by mouth daily.    [provider]  budesonide-formoterol (SYMBICORT) 80-4.5 MCG/ACT inhaler Inhale 2 puffs into the lungs 2 (two) times daily.    [provider]  citalopram (CELEXA) 20 MG tablet Take 20 mg by mouth daily.    [provider]  clopidogrel (PLAVIX) 75 MG tablet Take 75 mg by mouth daily.    [provider]  ibuprofen (ADVIL,MOTRIN) 600 MG tablet Take 600 mg by mouth every 6 (six) hours as needed. For pain    [provider]  isosorbide mononitrate (IMDUR) 30 MG 24 hr tablet Take 30 mg by mouth daily.    [provider]    metoprolol tartrate (LOPRESSOR) 25 MG tablet Take 25 mg by mouth 2 (two) times daily.    [provider]  nitroGLYCERIN (NITROSTAT) 0.4 MG SL tablet Place 0.4 mg under the tongue every 5 (five) minutes as needed. For chest pain    [provider]  triamcinolone cream (KENALOG) 0.1 % Apply 1 application topically 2 (two) times daily.    [provider]    Allergies Patient has no known allergies.  No family history on file.  Social History Social History   Tobacco Use  . Smoking status: Former Smoker  Substance Use Topics  . Alcohol use: Not on file  . Drug use: Not on file    Review of Systems Constitutional: No fever/chills Eyes: No visual changes. ENT: No sore throat. Cardiovascular: Denies chest pain. Respiratory: Denies shortness of breath. Gastrointestinal: No abdominal pain.  No nausea, no vomiting.  No diarrhea.  No constipation. Genitourinary: Negative for dysuria. Musculoskeletal: Negative for neck pain.  Negative for back pain. Integumentary: Negative for rash. Neurological: Negative for headaches, focal weakness or numbness.  ____________________________________________   PHYSICAL EXAM:  VITAL SIGNS: ED Triage Vitals  Enc Vitals Group     BP 05/07/17 0234 (!) 142/80     Pulse Rate 05/07/17 0234 (!) 105     Resp 05/07/17 0234 20     Temp 05/07/17 0234 98.4 F (36.9 C)  Temp Source 05/07/17 0234 Oral     SpO2 05/07/17 0234 95 %     Weight 05/07/17 0234 108.9 kg (240 lb)     Height 05/07/17 0234 1.702 m (5\' 7" )     Head Circumference --      Peak Flow --      Pain Score 05/07/17 0233 10     Pain Loc --      Pain Edu? --      Excl. in [place]? --     Constitutional: Alert and oriented. Well appearing and in no acute distress. Eyes: Conjunctivae are normal. Head: Atraumatic. Ears: Swelling noted right external auditory canal with exudate.  Visualized tympanic membrane erythema Nose: No congestion/rhinnorhea. Mouth/Throat:  Mucous membranes are moist.  Oropharynx non-erythematous. Neck: No stridor.   Cardiovascular: Normal rate, regular rhythm. Good peripheral circulation. Grossly normal heart sounds. Respiratory: Normal respiratory effort.  No retractions. Lungs CTAB. Neurologic:  Normal speech and language. No gross focal neurologic deficits are appreciated.  Skin:  Skin is warm, dry and intact. No rash noted. Psychiatric: Mood and affect are normal. Speech and behavior are normal.* _____________________________    Procedures   ____________________________________________   INITIAL IMPRESSION / ASSESSMENT AND PLAN / ED COURSE  As part of my medical decision making, I reviewed the following data within the electronic MEDICAL RECORD NUMBER9-year-old male present with history of physical exam consistent with right otitis externa.  Ciprodex was given in the emergency department as well as ibuprofen 800mg .  Patient will be prescribed both for home ____________________________________________  FINAL CLINICAL IMPRESSION(S) / ED DIAGNOSES  Final diagnoses:  Other infective acute otitis externa of right ear     MEDICATIONS GIVEN DURING THIS VISIT:  Medications  acetaminophen (TYLENOL) tablet 650 mg (650 mg Oral Given 05/07/17 0406)  ibuprofen (ADVIL,MOTRIN) tablet 800 mg (800 mg Oral Given 05/07/17 0603)  ciprofloxacin-dexamethasone (CIPRODEX) 0.3-0.1 % OTIC (EAR) suspension 4 drop (4 drops Right EAR Given 05/07/17 0625)  amoxicillin-clavulanate (AUGMENTIN) 875-125 MG per tablet 1 tablet (1 tablet Oral Given 05/07/17 0603)     ED Discharge Orders    None       Note:  This document was prepared using Dragon voice recognition software and may include unintentional dictation errors.    Gregor Hams, MD 05/07/17 [phone]

## 2017-05-14 ENCOUNTER — Encounter: Payer: Self-pay | Admitting: Emergency Medicine

## 2017-05-14 ENCOUNTER — Emergency Department
Admission: EM | Admit: 2017-05-14 | Discharge: 2017-05-14 | Disposition: A | Discharge status: Home / Self Care | Payer: Medicare Other | Attending: Emergency Medicine | Admitting: Emergency Medicine

## 2017-05-14 DIAGNOSIS — Y999 Unspecified external cause status: Secondary | ICD-10-CM | POA: Diagnosis not present

## 2017-05-14 DIAGNOSIS — Z87891 Personal history of nicotine dependence: Secondary | ICD-10-CM | POA: Diagnosis not present

## 2017-05-14 DIAGNOSIS — S161XXA Strain of muscle, fascia and tendon at neck level, initial encounter: Secondary | ICD-10-CM | POA: Insufficient documentation

## 2017-05-14 DIAGNOSIS — Y939 Activity, unspecified: Secondary | ICD-10-CM | POA: Insufficient documentation

## 2017-05-14 DIAGNOSIS — Z7982 Long term (current) use of aspirin: Secondary | ICD-10-CM | POA: Insufficient documentation

## 2017-05-14 DIAGNOSIS — I10 Essential (primary) hypertension: Secondary | ICD-10-CM | POA: Insufficient documentation

## 2017-05-14 DIAGNOSIS — E119 Type 2 diabetes mellitus without complications: Secondary | ICD-10-CM | POA: Diagnosis not present

## 2017-05-14 DIAGNOSIS — Z7902 Long term (current) use of antithrombotics/antiplatelets: Secondary | ICD-10-CM | POA: Diagnosis not present

## 2017-05-14 DIAGNOSIS — S199XXA Unspecified injury of neck, initial encounter: Secondary | ICD-10-CM | POA: Diagnosis present

## 2017-05-14 DIAGNOSIS — Z79899 Other long term (current) drug therapy: Secondary | ICD-10-CM | POA: Diagnosis not present

## 2017-05-14 DIAGNOSIS — Y92410 Unspecified street and highway as the place of occurrence of the external cause: Secondary | ICD-10-CM | POA: Diagnosis not present

## 2017-05-14 MED ORDER — KETOROLAC TROMETHAMINE 30 MG/ML IJ SOLN
INTRAMUSCULAR | Status: AC
  Administered 2017-05-14: 30 mg via INTRAMUSCULAR
  Filled 2017-05-14: qty 1

## 2017-05-14 MED ORDER — KETOROLAC TROMETHAMINE 30 MG/ML IJ SOLN
30.0000 mg | Freq: Once | INTRAMUSCULAR | Status: AC
  Administered 2017-05-14: 30 mg via INTRAMUSCULAR

## 2017-05-14 MED ORDER — NAPROXEN 500 MG PO TABS: 500.0000 mg | ORAL_TABLET | Freq: Two times a day (BID) | ORAL | 2 refills | Status: DC
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
## 2017-05-28 ENCOUNTER — Encounter: Admit: 2017-05-28 | Discharge: 2017-05-28 | Payer: MEDICARE | Attending: Anesthesiology | Primary: Anesthesiology

## 2017-05-28 ENCOUNTER — Ambulatory Visit: Admit: 2017-05-28 | Discharge: 2017-05-28 | Payer: MEDICARE

## 2017-05-28 DIAGNOSIS — H2512 Age-related nuclear cataract, left eye: Principal | ICD-10-CM

## 2017-06-02 ENCOUNTER — Other Ambulatory Visit: Payer: Self-pay

## 2017-06-02 ENCOUNTER — Emergency Department
Admission: EM | Admit: 2017-06-02 | Discharge: 2017-06-02 | Disposition: A | Discharge status: Home / Self Care | Payer: Medicare Other | Source: Home / Self Care | Attending: Emergency Medicine | Admitting: Emergency Medicine

## 2017-06-02 ENCOUNTER — Emergency Department
Admission: EM | Admit: 2017-06-02 | Discharge: 2017-06-02 | Disposition: A | Discharge status: Home / Self Care | Payer: Medicare Other | Attending: Emergency Medicine | Admitting: Emergency Medicine

## 2017-06-02 ENCOUNTER — Encounter: Payer: Self-pay | Admitting: Emergency Medicine

## 2017-06-02 DIAGNOSIS — E119 Type 2 diabetes mellitus without complications: Secondary | ICD-10-CM

## 2017-06-02 DIAGNOSIS — Z79899 Other long term (current) drug therapy: Secondary | ICD-10-CM

## 2017-06-02 DIAGNOSIS — Z87891 Personal history of nicotine dependence: Secondary | ICD-10-CM | POA: Insufficient documentation

## 2017-06-02 DIAGNOSIS — H5712 Ocular pain, left eye: Secondary | ICD-10-CM | POA: Insufficient documentation

## 2017-06-02 DIAGNOSIS — Z7982 Long term (current) use of aspirin: Secondary | ICD-10-CM | POA: Insufficient documentation

## 2017-06-02 DIAGNOSIS — R220 Localized swelling, mass and lump, head: Secondary | ICD-10-CM | POA: Diagnosis not present

## 2017-06-02 DIAGNOSIS — I10 Essential (primary) hypertension: Secondary | ICD-10-CM

## 2017-06-02 DIAGNOSIS — Z87720 Personal history of (corrected) congenital malformations of eye: Secondary | ICD-10-CM | POA: Insufficient documentation

## 2017-06-02 DIAGNOSIS — Z7902 Long term (current) use of antithrombotics/antiplatelets: Secondary | ICD-10-CM

## 2017-06-02 LAB — CBC WITH DIFFERENTIAL/PLATELET
Basophils Absolute: 0.1 10*3/uL (ref 0–0.1)
Basophils Relative: 1 %
EOS ABS: 0.3 10*3/uL (ref 0–0.7)
Eosinophils Relative: 4 %
HCT: 42.4 % (ref 40.0–52.0)
HEMOGLOBIN: 13.9 g/dL (ref 13.0–18.0)
LYMPHS ABS: 2.2 10*3/uL (ref 1.0–3.6)
Lymphocytes Relative: 30 %
MCH: 29.3 pg (ref 26.0–34.0)
MCHC: 32.8 g/dL (ref 32.0–36.0)
MCV: 89.2 fL (ref 80.0–100.0)
Monocytes Absolute: 0.5 10*3/uL (ref 0.2–1.0)
Monocytes Relative: 7 %
NEUTROS PCT: 58 %
Neutro Abs: 4.4 10*3/uL (ref 1.4–6.5)
Platelets: 244 10*3/uL (ref 150–440)
RBC: 4.76 MIL/uL (ref 4.40–5.90)
RDW: 15.5 % — ABNORMAL HIGH (ref 11.5–14.5)
WBC: 7.5 10*3/uL (ref 3.8–10.6)

## 2017-06-02 MED ORDER — OXYCODONE-ACETAMINOPHEN 5-325 MG PO TABS
1.0000 | ORAL_TABLET | Freq: Once | ORAL | Status: AC
  Administered 2017-06-02: 1 via ORAL
  Filled 2017-06-02: qty 1

## 2017-06-02 MED ORDER — OXYCODONE-ACETAMINOPHEN 7.5-325 MG PO TABS: 1.0000 | ORAL_TABLET | Freq: Four times a day (QID) | ORAL | 0 refills | Status: DC | PRN

## 2017-06-02 NOTE — Discharge Instructions (Signed)
Continue previous medication follow-up with ophthalmologist by call to follow appointment on Monday.

## 2017-06-02 NOTE — ED Notes (Signed)
FIRST NURSE NOTE:  Red eye and pain in head. S/p cataract surgery on Monday.

## 2017-06-02 NOTE — ED Triage Notes (Signed)
Pt to ED from home c/o left eye pain for about a week.  States had left eye cataract surgery this past Monday and has had pain to that eye and left forehead since.  States was told surgery went well.  Left eye red and states watery.

## 2017-06-02 NOTE — ED Triage Notes (Signed)
Pt states he had left eye cataract surgery on Monday. Pt states he began to have left eye pain this am and left sided facial swelling this am. Pt states he was seen in ed, but has returned tonight due to "more swelling". Pt with left sided lower facial swelling noted. Pt appears in no acute distress.

## 2017-06-02 NOTE — ED Provider Notes (Signed)
[facility] Emergency Department Provider Note   ____________________________________________    I have reviewed the triage vital signs and the nursing notes.   HISTORY  Chief Complaint Facial Swelling and Eye Pain     HPI Nicholas Reyes is a 62 y.o. male who presents for facial swelling in the left, he is concerned because he had cataract surgery 5 days ago.  He reports healing has seem to be well.  He was seen in the emergency department earlier today but decided to come back because he did not have blood work.  Denies headache.  No fevers.  No discharge from the eye   Past Medical History:  Diagnosis Date  . Diabetes mellitus   . Hypertension   . Migraines     There are no active problems to display for this patient.   Past Surgical History:  Procedure Laterality Date  . CATARACT EXTRACTION Left 05/28/2017    Prior to Admission medications   Medication Sig Start Date End Date Taking? Authorizing Provider  albuterol (PROVENTIL HFA;VENTOLIN HFA) 108 (90 BASE) MCG/ACT inhaler Inhale 2 puffs into the lungs every 4 (four) hours as needed. For shortness of breath    [provider]  aspirin 325 MG tablet Take 325 mg by mouth daily.    [provider]  atorvastatin (LIPITOR) 20 MG tablet Take 20 mg by mouth daily.    [provider]  benazepril (LOTENSIN) 20 MG tablet Take 20 mg by mouth daily.    [provider]  budesonide-formoterol (SYMBICORT) 80-4.5 MCG/ACT inhaler Inhale 2 puffs into the lungs 2 (two) times daily.    [provider]  citalopram (CELEXA) 20 MG tablet Take 20 mg by mouth daily.    [provider]  clopidogrel (PLAVIX) 75 MG tablet Take 75 mg by mouth daily.    [provider]  ibuprofen (ADVIL,MOTRIN) 600 MG tablet Take 600 mg by mouth every 6 (six) hours as needed. For pain    [provider]  isosorbide mononitrate (IMDUR) 30 MG 24 hr tablet Take 30 mg  by mouth daily.    [provider]  metoprolol tartrate (LOPRESSOR) 25 MG tablet Take 25 mg by mouth 2 (two) times daily.    [provider]  naproxen (NAPROSYN) 500 MG tablet Take 1 tablet (500 mg total) by mouth 2 (two) times daily with a meal. 05/14/17   Lavonia Drafts, MD  nitroGLYCERIN (NITROSTAT) 0.4 MG SL tablet Place 0.4 mg under the tongue every 5 (five) minutes as needed. For chest pain    [provider]  oxyCODONE-acetaminophen (PERCOCET) 7.5-325 MG tablet Take 1 tablet by mouth every 6 (six) hours as needed for severe pain. 06/02/17   Sable Feil, PA-C  triamcinolone cream (KENALOG) 0.1 % Apply 1 application topically 2 (two) times daily.    [provider]     Allergies Patient has no known allergies.  No family history on file.  Social History Social History   Tobacco Use  . Smoking status: Former Research scientist (life sciences)  . Smokeless tobacco: Never Used  Substance Use Topics  . Alcohol use: No    Frequency: Never  . Drug use: No    Review of Systems  Constitutional: No fever/chills Eyes: Healing from cataract surgery ENT: No sore throat.     Skin: Negative for rash. Neurological: Negative for headaches or weakness   ____________________________________________   PHYSICAL EXAM:  VITAL SIGNS: ED Triage Vitals [06/02/17 1955]  Enc  Vitals Group     BP (!) 164/69     Pulse Rate 76     Resp 16     Temp 98.2 F (36.8 C)     Temp Source Oral     SpO2 94 %     Weight 108.9 kg (240 lb)     Height 1.702 m (5\' 7" )     Head Circumference      Peak Flow      Pain Score 9     Pain Loc      Pain Edu?      Excl. in [place]?     Constitutional: Alert and oriented. No acute distress. Pleasant and interactive Eyes: Left eye: Status post cataract surgery, appears to be healing well, no swelling noted around the eye or on the face Head: Atraumatic.  No facial swelling noted no tenderness palpation no erythema no intraoral swelling Nose: No  congestion/rhinnorhea. Mouth/Throat: Mucous membranes are moist.    Cardiovascular: Normal rate, regular rhythm.  Good peripheral circulation. Respiratory: Normal respiratory effort.    Musculoskeletal:  Warm and well perfused Neurologic:  Normal speech and language. No gross focal neurologic deficits are appreciated.  Skin:  Skin is warm, dry and intact. No rash noted. Psychiatric: Mood and affect are normal. Speech and behavior are normal.  ____________________________________________   LABS (all labs ordered are listed, but only abnormal results are displayed)  Labs Reviewed  CBC WITH DIFFERENTIAL/PLATELET - Abnormal; Notable for the following components:      Result Value   RDW 15.5 (*)    All other components within normal limits   ____________________________________________  EKG  None ____________________________________________  RADIOLOGY  None ____________________________________________   PROCEDURES  Procedure(s) performed: No  Procedures   Critical Care performed:No ____________________________________________   INITIAL IMPRESSION / ASSESSMENT AND PLAN / ED COURSE  Pertinent labs & imaging results that were available during my care of the patient were reviewed by me and considered in my medical decision making (see chart for details).  Patient well-appearing, no abnormality on exam despite his insistence on facial swelling, if he does have any swelling is quite mild no evidence of infection or erythema or tenderness    ____________________________________________   FINAL CLINICAL IMPRESSION(S) / ED DIAGNOSES  Final diagnoses:  Facial swelling        Note:  This document was prepared using Dragon voice recognition software and may include unintentional dictation errors.    Lavonia Drafts, MD 06/02/17 [phone]

## 2017-06-02 NOTE — ED Provider Notes (Signed)
[facility] Emergency Department Provider Note   ____________________________________________   First MD Initiated Contact with Patient 06/02/17 1218     (approximate)  I have reviewed the triage vital signs and the nursing notes.   HISTORY  Chief Complaint Eye Pain    HPI Nicholas Reyes is a 62 y.o. male patient states left eye pain secondary to cataract surgery a week ago.  Patient state was told his surgery went well but has increased pain in the last 2 days.  Patient denies vision disturbance.  Patient also complained of watery eyes.  Patient rates the pain as a 9/10.  Patient described the pain as "aching".  No palliative measures except for his eyedrops.  Past Medical History:  Diagnosis Date  . Diabetes mellitus   . Hypertension   . Migraines     There are no active problems to display for this patient.   Past Surgical History:  Procedure Laterality Date  . CATARACT EXTRACTION Left 05/28/2017    Prior to Admission medications   Medication Sig Start Date End Date Taking? Authorizing Provider  albuterol (PROVENTIL HFA;VENTOLIN HFA) 108 (90 BASE) MCG/ACT inhaler Inhale 2 puffs into the lungs every 4 (four) hours as needed. For shortness of breath    [provider]  aspirin 325 MG tablet Take 325 mg by mouth daily.    [provider]  atorvastatin (LIPITOR) 20 MG tablet Take 20 mg by mouth daily.    [provider]  benazepril (LOTENSIN) 20 MG tablet Take 20 mg by mouth daily.    [provider]  budesonide-formoterol (SYMBICORT) 80-4.5 MCG/ACT inhaler Inhale 2 puffs into the lungs 2 (two) times daily.    [provider]  citalopram (CELEXA) 20 MG tablet Take 20 mg by mouth daily.    [provider]  clopidogrel (PLAVIX) 75 MG tablet Take 75 mg by mouth daily.    [provider]  ibuprofen (ADVIL,MOTRIN) 600 MG tablet Take 600 mg by mouth every 6 (six) hours as needed. For pain     [provider]  isosorbide mononitrate (IMDUR) 30 MG 24 hr tablet Take 30 mg by mouth daily.    [provider]  metoprolol tartrate (LOPRESSOR) 25 MG tablet Take 25 mg by mouth 2 (two) times daily.    [provider]  naproxen (NAPROSYN) 500 MG tablet Take 1 tablet (500 mg total) by mouth 2 (two) times daily with a meal. 05/14/17   Lavonia Drafts, MD  nitroGLYCERIN (NITROSTAT) 0.4 MG SL tablet Place 0.4 mg under the tongue every 5 (five) minutes as needed. For chest pain    [provider]  oxyCODONE-acetaminophen (PERCOCET) 7.5-325 MG tablet Take 1 tablet by mouth every 6 (six) hours as needed for severe pain. 06/02/17   Sable Feil, PA-C  triamcinolone cream (KENALOG) 0.1 % Apply 1 application topically 2 (two) times daily.    [provider]    Allergies Patient has no known allergies.  History reviewed. No pertinent family history.  Social History Social History   Tobacco Use  . Smoking status: Former Research scientist (life sciences)  . Smokeless tobacco: Never Used  Substance Use Topics  . Alcohol use: No    Frequency: Never  . Drug use: No    Review of Systems Constitutional: No fever/chills Eyes: No visual changes.  5 pain ENT: No sore throat. Cardiovascular: Denies chest pain. Respiratory: Denies shortness of breath. Gastrointestinal: No abdominal pain.  No nausea, no vomiting.  No  diarrhea.  No constipation. Genitourinary: Negative for dysuria. Musculoskeletal: Negative for back pain. Skin: Negative for rash. Neurological: Negative for headaches, focal weakness or numbness. Endocrine:Diabetes and hypertension   ____________________________________________   PHYSICAL EXAM:  VITAL SIGNS: ED Triage Vitals [06/02/17 1126]  Enc Vitals Group     BP 132/73     Pulse Rate 80     Resp 18     Temp 98 F (36.7 C)     Temp Source Oral     SpO2 94 %     Weight 240 lb (108.9 kg)     Height 5\' 7"  (1.702 m)     Head Circumference      Peak  Flow      Pain Score 9     Pain Loc      Pain Edu?      Excl. in [place]?     Constitutional: Alert and oriented. Well appearing and in no acute distress. Eyes: Conjunctivae are normal. PERRL. EOMI. Cardiovascular: Normal rate, regular rhythm. Grossly normal heart sounds.  Good peripheral circulation. Respiratory: Normal respiratory effort.  No retractions. Lungs CTAB. Neurologic:  Normal speech and language. No gross focal neurologic deficits are appreciated. No gait instability. Skin:  Skin is warm, dry and intact. No rash noted. Psychiatric: Mood and affect are normal. Speech and behavior are normal.  ____________________________________________   LABS (all labs ordered are listed, but only abnormal results are displayed)  Labs Reviewed - No data to display ____________________________________________  EKG   ____________________________________________  RADIOLOGY  No results found.  ____________________________________________   PROCEDURES  Procedure(s) performed: None  Procedures  Critical Care performed: No  ____________________________________________   INITIAL IMPRESSION / ASSESSMENT AND PLAN / ED COURSE  As part of my medical decision making, I reviewed the following data within the electronic MEDICAL RECORD NUMBER    Left eye pain secondary to cataract surgery 1 week ago.  Patient advised to continue eyedrops and was given a prescription for Percocet for 3 days.  Patient advised to follow up telephonically with his ophthalmologist 2 days.  Turn back to ED if condition worsens.      ____________________________________________   FINAL CLINICAL IMPRESSION(S) / ED DIAGNOSES  Final diagnoses:  Left eye pain     ED Discharge Orders        Ordered    oxyCODONE-acetaminophen (PERCOCET) 7.5-325 MG tablet  Every 6 hours PRN     06/02/17 1223       Note:  This document was prepared using Dragon voice recognition software and may include unintentional  dictation errors.    Sable Feil, PA-C 06/02/17 1231    Nena Polio, MD 06/02/17 [phone]

## 2017-06-02 NOTE — Discharge Instructions (Signed)
Please follow up with your eye doctor as we discussed. Please use an ice pack on your face as needed

## 2017-06-11 ENCOUNTER — Ambulatory Visit: Admit: 2017-06-11 | Discharge: 2017-06-12 | Payer: MEDICARE

## 2017-06-11 DIAGNOSIS — I2510 Atherosclerotic heart disease of native coronary artery without angina pectoris: Principal | ICD-10-CM

## 2017-06-11 DIAGNOSIS — I10 Essential (primary) hypertension: Secondary | ICD-10-CM

## 2017-06-11 DIAGNOSIS — E782 Mixed hyperlipidemia: Secondary | ICD-10-CM

## 2017-06-14 ENCOUNTER — Other Ambulatory Visit: Payer: Self-pay

## 2017-06-14 ENCOUNTER — Encounter: Payer: Self-pay | Admitting: Emergency Medicine

## 2017-06-14 ENCOUNTER — Emergency Department
Admission: EM | Admit: 2017-06-14 | Discharge: 2017-06-14 | Disposition: A | Discharge status: Home / Self Care | Payer: Medicare Other | Attending: Emergency Medicine | Admitting: Emergency Medicine

## 2017-06-14 ENCOUNTER — Emergency Department: Payer: Medicare Other

## 2017-06-14 DIAGNOSIS — E119 Type 2 diabetes mellitus without complications: Secondary | ICD-10-CM | POA: Diagnosis not present

## 2017-06-14 DIAGNOSIS — J111 Influenza due to unidentified influenza virus with other respiratory manifestations: Secondary | ICD-10-CM | POA: Diagnosis not present

## 2017-06-14 DIAGNOSIS — Z7982 Long term (current) use of aspirin: Secondary | ICD-10-CM | POA: Diagnosis not present

## 2017-06-14 DIAGNOSIS — J029 Acute pharyngitis, unspecified: Secondary | ICD-10-CM | POA: Diagnosis present

## 2017-06-14 DIAGNOSIS — Z87891 Personal history of nicotine dependence: Secondary | ICD-10-CM | POA: Diagnosis not present

## 2017-06-14 DIAGNOSIS — I10 Essential (primary) hypertension: Secondary | ICD-10-CM | POA: Insufficient documentation

## 2017-06-14 DIAGNOSIS — Z79899 Other long term (current) drug therapy: Secondary | ICD-10-CM | POA: Insufficient documentation

## 2017-06-14 LAB — GLUCOSE, CAPILLARY: GLUCOSE-CAPILLARY: 83 mg/dL (ref 65–99)

## 2017-06-14 LAB — INFLUENZA PANEL BY PCR (TYPE A & B)
INFLBPCR: NEGATIVE
Influenza A By PCR: POSITIVE — AB

## 2017-06-14 MED ORDER — OSELTAMIVIR PHOSPHATE 75 MG PO CAPS: 75.0000 mg | ORAL_CAPSULE | Freq: Two times a day (BID) | ORAL | 0 refills | Status: AC

## 2017-06-14 MED ORDER — ONDANSETRON 4 MG PO TBDP
4.0000 mg | ORAL_TABLET | Freq: Once | ORAL | Status: AC
  Administered 2017-06-14: 4 mg via ORAL
  Filled 2017-06-14: qty 1

## 2017-06-14 MED ORDER — ONDANSETRON 4 MG PO TBDP: 4.0000 mg | ORAL_TABLET | Freq: Three times a day (TID) | ORAL | 0 refills | Status: DC | PRN

## 2017-06-14 MED ORDER — BENZONATATE 100 MG PO CAPS: ORAL_CAPSULE | ORAL | 0 refills | Status: DC

## 2017-06-14 MED ORDER — ACETAMINOPHEN-CODEINE #3 300-30 MG PO TABS: 1.0000 | ORAL_TABLET | Freq: Four times a day (QID) | ORAL | 0 refills | Status: DC | PRN

## 2017-06-14 MED ORDER — ACETAMINOPHEN-CODEINE #3 300-30 MG PO TABS
1.0000 | ORAL_TABLET | Freq: Once | ORAL | Status: AC
  Administered 2017-06-14: 1 via ORAL
  Filled 2017-06-14: qty 1
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
## 2017-06-14 NOTE — ED Notes (Signed)
See triage note  Presents with sore throat and body aches  Started feeling this way couple of days ago  low grade fever on arrival

## 2017-06-14 NOTE — ED Provider Notes (Signed)
[facility] Emergency Department Provider Note ____________________________________________  Time seen: 1206  I have reviewed the triage vital signs and the nursing notes.  HISTORY  Chief Complaint  Sore Throat; Fever; and Wound Check  HPI Nicholas Reyes is a 62 y.o. male presents himself to the ED for evaluation of sore throat, fevers, body aches with sudden onset last night.  Patient reports that his girlfriend had similar symptoms earlier in the week and was suspicious for influenza.  He did not receive the seasonal flu vaccine.  He is not been taking any medication since onset.  He denies any nausea, vomiting, dizziness.  He denies any shortness of breath above his baseline, given his history of COPD.  He did not take his diabetes medication this morning because he has had decreased appetite and did not eat.  Has any chest pain, shortness of breath, or abdominal pain.  Past Medical History:  Diagnosis Date  . Diabetes mellitus   . Hypertension   . Migraines     There are no active problems to display for this patient.   Past Surgical History:  Procedure Laterality Date  . CATARACT EXTRACTION Left 05/28/2017    Prior to Admission medications   Medication Sig Start Date End Date Taking? Authorizing Provider  acetaminophen-codeine (TYLENOL #3) 300-30 MG tablet Take 1 tablet by mouth every 6 (six) hours as needed for moderate pain. 06/14/17   Justice Aguirre, Dannielle Karvonen, PA-C  albuterol (PROVENTIL HFA;VENTOLIN HFA) 108 (90 BASE) MCG/ACT inhaler Inhale 2 puffs into the lungs every 4 (four) hours as needed. For shortness of breath    [provider]  aspirin 325 MG tablet Take 325 mg by mouth daily.    [provider]  atorvastatin (LIPITOR) 20 MG tablet Take 20 mg by mouth daily.    [provider]  benazepril (LOTENSIN) 20 MG tablet Take 20 mg by mouth daily.    [provider]  benzonatate (TESSALON PERLES) 100 MG capsule Take  1-2 tabs TID prn cough 06/14/17   Shivani Barrantes, Dannielle Karvonen, PA-C  budesonide-formoterol (SYMBICORT) 80-4.5 MCG/ACT inhaler Inhale 2 puffs into the lungs 2 (two) times daily.    [provider]  citalopram (CELEXA) 20 MG tablet Take 20 mg by mouth daily.    [provider]  clopidogrel (PLAVIX) 75 MG tablet Take 75 mg by mouth daily.    [provider]  ibuprofen (ADVIL,MOTRIN) 600 MG tablet Take 600 mg by mouth every 6 (six) hours as needed. For pain    [provider]  isosorbide mononitrate (IMDUR) 30 MG 24 hr tablet Take 30 mg by mouth daily.    [provider]  metoprolol tartrate (LOPRESSOR) 25 MG tablet Take 25 mg by mouth 2 (two) times daily.    [provider]  naproxen (NAPROSYN) 500 MG tablet Take 1 tablet (500 mg total) by mouth 2 (two) times daily with a meal. 05/14/17   Lavonia Drafts, MD  nitroGLYCERIN (NITROSTAT) 0.4 MG SL tablet Place 0.4 mg under the tongue every 5 (five) minutes as needed. For chest pain    [provider]  ondansetron (ZOFRAN ODT) 4 MG disintegrating tablet Take 1 tablet (4 mg total) by mouth every 8 (eight) hours as needed. 06/14/17   Jessy Cybulski, Dannielle Karvonen, PA-C  oseltamivir (TAMIFLU) 75 MG capsule Take 1 capsule (75 mg total) by mouth 2 (two) times daily for 5 days. 06/14/17 06/19/17  Tyliyah Mcmeekin, Dannielle Karvonen, PA-C  oxyCODONE-acetaminophen (PERCOCET)  7.5-325 MG tablet Take 1 tablet by mouth every 6 (six) hours as needed for severe pain. 06/02/17   Sable Feil, PA-C  triamcinolone cream (KENALOG) 0.1 % Apply 1 application topically 2 (two) times daily.    [provider]    Allergies Patient has no known allergies.  No family history on file.  Social History Social History   Tobacco Use  . Smoking status: Former Research scientist (life sciences)  . Smokeless tobacco: Never Used  Substance Use Topics  . Alcohol use: No    Frequency: Never  . Drug use: No    Review of Systems  Constitutional: Positive for  fever. Eyes: Negative for visual changes. ENT: Positive for sore throat. Cardiovascular: Negative for chest pain. Respiratory: Negative for shortness of breath. Gastrointestinal: Negative for abdominal pain, vomiting and diarrhea. Genitourinary: Negative for dysuria. Musculoskeletal: Negative for back pain.  Reports generalized body aches. Skin: Negative for rash. Neurological: Negative for headaches, focal weakness or numbness. ____________________________________________  PHYSICAL EXAM:  VITAL SIGNS: ED Triage Vitals  Enc Vitals Group     BP 06/14/17 1132 121/68     Pulse Rate 06/14/17 1132 93     Resp 06/14/17 1132 16     Temp 06/14/17 1132 99.7 F (37.6 C)     Temp Source 06/14/17 1132 Oral     SpO2 06/14/17 1132 94 %     Weight 06/14/17 1133 240 lb (108.9 kg)     Height 06/14/17 1133 5\' 7"  (1.702 m)     Head Circumference --      Peak Flow --      Pain Score 06/14/17 1132 10     Pain Loc --      Pain Edu? --      Excl. in [place]? --     Constitutional: Alert and oriented. Well appearing and in no distress. Head: Normocephalic and atraumatic. Eyes: Conjunctivae are normal. PERRL. Normal extraocular movements Ears: Canals clear. TMs intact bilaterally. Nose: No congestion/rhinorrhea/epistaxis. Mouth/Throat: Mucous membranes are moist.  Uvula is surgically absent and tonsils are flat.   Neck: Supple. No thyromegaly. Hematological/Lymphatic/Immunological: No cervical lymphadenopathy. Cardiovascular: Normal rate, regular rhythm. Normal distal pulses. Respiratory: Normal respiratory effort. No wheezes/rales/rhonchi. Gastrointestinal: Soft and nontender. No distention. Musculoskeletal: Nontender with normal range of motion in all extremities.  Neurologic:  Normal gait without ataxia. Normal speech and language. No gross focal neurologic deficits are appreciated. Skin:  Skin is warm, dry and intact. No rash noted. healed superficial abscesses to the abdomen and  buttock. ____________________________________________   LABS (pertinent positives/negatives)  Labs Reviewed  INFLUENZA PANEL BY PCR (TYPE A & B) - Abnormal; Notable for the following components:      Result Value   Influenza A By PCR POSITIVE (*)    All other components within normal limits  GLUCOSE, CAPILLARY  CBG MONITORING, ED  ____________________________________________   RADIOLOGY  CXR  IMPRESSION: No active cardiopulmonary disease. ____________________________________________  PROCEDURES  Procedures Zofran 4 mg ODT Tylenol #3 i PO ____________________________________________  INITIAL IMPRESSION / ASSESSMENT AND PLAN / ED COURSE  Patient with ED evaluation of sudden onset of cough, body aches, chills.  Patient's symptoms are consistent with a likely viral etiology.  This is confirmed to be influenza by PCR.  Patient is discharged with prescriptions for Tessalon Perles, Zofran, Tamiflu, Tylenol with codeine.  He is reassured by his negative chest x-ray which shows no infectious process.  Patient will follow up with his primary care provider or return to the ED  as needed. ____________________________________________  FINAL CLINICAL IMPRESSION(S) / ED DIAGNOSES  Final diagnoses:  Influenza      Melvenia Needles, PA-C 06/14/17 1552    Harvest Dark, MD 06/15/17 2205

## 2017-06-14 NOTE — Discharge Instructions (Signed)
You have been confirmed as having the flu. Take the prescription meds as directed. Drink fluids to prevent dehydration. Follow-up with [facility] for ongoing or worsening symptoms. Consider getting the flu shot once you feel better.

## 2017-06-16 ENCOUNTER — Emergency Department
Admission: EM | Admit: 2017-06-16 | Discharge: 2017-06-16 | Disposition: A | Discharge status: Home / Self Care | Payer: Medicare Other | Attending: Emergency Medicine | Admitting: Emergency Medicine

## 2017-06-16 ENCOUNTER — Encounter: Payer: Self-pay | Admitting: Emergency Medicine

## 2017-06-16 ENCOUNTER — Other Ambulatory Visit: Payer: Self-pay

## 2017-06-16 DIAGNOSIS — Z7982 Long term (current) use of aspirin: Secondary | ICD-10-CM | POA: Diagnosis not present

## 2017-06-16 DIAGNOSIS — Z87891 Personal history of nicotine dependence: Secondary | ICD-10-CM | POA: Insufficient documentation

## 2017-06-16 DIAGNOSIS — H9201 Otalgia, right ear: Secondary | ICD-10-CM | POA: Diagnosis present

## 2017-06-16 DIAGNOSIS — E119 Type 2 diabetes mellitus without complications: Secondary | ICD-10-CM | POA: Diagnosis not present

## 2017-06-16 DIAGNOSIS — I10 Essential (primary) hypertension: Secondary | ICD-10-CM | POA: Insufficient documentation

## 2017-06-16 DIAGNOSIS — Z7902 Long term (current) use of antithrombotics/antiplatelets: Secondary | ICD-10-CM | POA: Diagnosis not present

## 2017-06-16 DIAGNOSIS — H60501 Unspecified acute noninfective otitis externa, right ear: Secondary | ICD-10-CM | POA: Diagnosis not present

## 2017-06-16 DIAGNOSIS — Z79899 Other long term (current) drug therapy: Secondary | ICD-10-CM | POA: Diagnosis not present

## 2017-06-16 MED ORDER — CIPROFLOXACIN-DEXAMETHASONE 0.3-0.1 % OT SUSP
4.0000 [drp] | Freq: Once | OTIC | Status: AC
  Administered 2017-06-16: 4 [drp] via OTIC
  Filled 2017-06-16: qty 7.5

## 2017-06-16 NOTE — ED Notes (Signed)
Called pharmacy for ear drops.

## 2017-06-16 NOTE — ED Provider Notes (Signed)
[facility] Emergency Department Provider Note  ____________________________________________  Time seen: Approximately 4:18 PM  I have reviewed the triage vital signs and the nursing notes.   HISTORY  Chief Complaint Otalgia    HPI Nicholas Reyes is a 62 y.o. male presenting to the emergency department with 10 out of 10 right ear pain reproducible with palpation of the right tragus that started today. No radiation of pain.  No discharge or fever.  Patient has a history of diabetes. No alleviating measures have been attempted.    Past Medical History:  Diagnosis Date  . Diabetes mellitus   . Hypertension   . Migraines     There are no active problems to display for this patient.   Past Surgical History:  Procedure Laterality Date  . CATARACT EXTRACTION Left 05/28/2017    Prior to Admission medications   Medication Sig Start Date End Date Taking? Authorizing Provider  acetaminophen-codeine (TYLENOL #3) 300-30 MG tablet Take 1 tablet by mouth every 6 (six) hours as needed for moderate pain. 06/14/17   Menshew, Dannielle Karvonen, PA-C  albuterol (PROVENTIL HFA;VENTOLIN HFA) 108 (90 BASE) MCG/ACT inhaler Inhale 2 puffs into the lungs every 4 (four) hours as needed. For shortness of breath    [provider]  aspirin 325 MG tablet Take 325 mg by mouth daily.    [provider]  atorvastatin (LIPITOR) 20 MG tablet Take 20 mg by mouth daily.    [provider]  benazepril (LOTENSIN) 20 MG tablet Take 20 mg by mouth daily.    [provider]  benzonatate (TESSALON PERLES) 100 MG capsule Take 1-2 tabs TID prn cough 06/14/17   Menshew, Dannielle Karvonen, PA-C  budesonide-formoterol (SYMBICORT) 80-4.5 MCG/ACT inhaler Inhale 2 puffs into the lungs 2 (two) times daily.    [provider]  citalopram (CELEXA) 20 MG tablet Take 20 mg by mouth daily.    [provider]  clopidogrel (PLAVIX) 75 MG tablet Take 75 mg by mouth  daily.    [provider]  ibuprofen (ADVIL,MOTRIN) 600 MG tablet Take 600 mg by mouth every 6 (six) hours as needed. For pain    [provider]  isosorbide mononitrate (IMDUR) 30 MG 24 hr tablet Take 30 mg by mouth daily.    [provider]  metoprolol tartrate (LOPRESSOR) 25 MG tablet Take 25 mg by mouth 2 (two) times daily.    [provider]  naproxen (NAPROSYN) 500 MG tablet Take 1 tablet (500 mg total) by mouth 2 (two) times daily with a meal. 05/14/17   Lavonia Drafts, MD  nitroGLYCERIN (NITROSTAT) 0.4 MG SL tablet Place 0.4 mg under the tongue every 5 (five) minutes as needed. For chest pain    [provider]  ondansetron (ZOFRAN ODT) 4 MG disintegrating tablet Take 1 tablet (4 mg total) by mouth every 8 (eight) hours as needed. 06/14/17   Menshew, Dannielle Karvonen, PA-C  oseltamivir (TAMIFLU) 75 MG capsule Take 1 capsule (75 mg total) by mouth 2 (two) times daily for 5 days. 06/14/17 06/19/17  Menshew, Dannielle Karvonen, PA-C  oxyCODONE-acetaminophen (PERCOCET) 7.5-325 MG tablet Take 1 tablet by mouth every 6 (six) hours as needed for severe pain. 06/02/17   Sable Feil, PA-C  triamcinolone cream (KENALOG) 0.1 % Apply 1 application topically 2 (two) times daily.    [provider]    Allergies Patient has no known allergies.  No family history on file.  Social  History Social History   Tobacco Use  . Smoking status: Former Research scientist (life sciences)  . Smokeless tobacco: Never Used  Substance Use Topics  . Alcohol use: No    Frequency: Never  . Drug use: No     Review of Systems  Constitutional: No fever/chills Eyes: No visual changes. No discharge ENT: Patient has right ear pain.  Cardiovascular: no chest pain. Respiratory: no cough. No SOB. Gastrointestinal: No abdominal pain.  No nausea, no vomiting.  No diarrhea.  No constipation. Musculoskeletal: Negative for musculoskeletal pain. Skin: Negative for rash, abrasions, lacerations,  ecchymosis. Neurological: Negative for headaches, focal weakness or numbness.  ____________________________________________   PHYSICAL EXAM:  VITAL SIGNS: ED Triage Vitals  Enc Vitals Group     BP 06/16/17 1422 (!) 150/86     Pulse Rate 06/16/17 1422 90     Resp 06/16/17 1422 20     Temp 06/16/17 1422 98.6 F (37 C)     Temp Source 06/16/17 1422 Oral     SpO2 06/16/17 1422 92 %     Weight 06/16/17 1423 240 lb (108.9 kg)     Height 06/16/17 1423 5\' 7"  (1.702 m)     Head Circumference --      Peak Flow --      Pain Score 06/16/17 1422 10     Pain Loc --      Pain Edu? --      Excl. in [place]? --      Constitutional: Alert and oriented. Well appearing and in no acute distress. Eyes: Conjunctivae are normal. PERRL. EOMI. Head: Atraumatic. ENT:      Ears: Patient has reproducible pain to palpation of the right tragus.  Tympanic membrane is pearly, right.      Nose: No congestion/rhinnorhea.      Mouth/Throat: Mucous membranes are moist.  Neck: No stridor.  No cervical spine tenderness to palpation.  Cardiovascular: Normal rate, regular rhythm. Normal S1 and S2.  Good peripheral circulation. Respiratory: Normal respiratory effort without tachypnea or retractions. Lungs CTAB. Good air entry to the bases with no decreased or absent breath sounds. Skin:  Skin is warm, dry and intact. No rash noted.  ____________________________________________   LABS (all labs ordered are listed, but only abnormal results are displayed)  Labs Reviewed - No data to display ____________________________________________  EKG   ____________________________________________  RADIOLOGY   No results found.  ____________________________________________    PROCEDURES  Procedure(s) performed:    Procedures    Medications  ciprofloxacin-dexamethasone (CIPRODEX) 0.3-0.1 % OTIC (EAR) suspension 4 drop (4 drops Right EAR Given 06/16/17 1551)      ____________________________________________   INITIAL IMPRESSION / ASSESSMENT AND PLAN / ED COURSE  Pertinent labs & imaging results that were available during my care of the patient were reviewed by me and considered in my medical decision making (see chart for details).  Review of the [place] CSRS was performed in accordance of the [place] prior to dispensing any controlled drugs.     Assessment and plan Otitis externa Differential diagnosis includes otitis media, otitis externa and middle ear effusion.  History and physical exam findings are consistent with otitis externa.  Patient was given Ciprodex in the emergency department and discharged with Ciprodex.  Vital signs are reassuring prior to discharge.  All patient questions were answered.    ____________________________________________  FINAL CLINICAL IMPRESSION(S) / ED DIAGNOSES  Final diagnoses:  Acute otitis externa of right ear, unspecified type      NEW MEDICATIONS STARTED DURING  THIS VISIT:  ED Discharge Orders    None          This chart was dictated using voice recognition software/Dragon. Despite best efforts to proofread, errors can occur which can change the meaning. Any change was purely unintentional.    Lannie Fields, PA-C 06/16/17 1630    Harvest Dark, MD 06/18/17 1600

## 2017-06-16 NOTE — ED Triage Notes (Signed)
R ear pain since yesterday. Trouble hearing.

## 2017-06-16 NOTE — ED Notes (Signed)
Right ear pain, decreased hearing x 2 days. Pt states he recently had the flu

## 2017-06-16 NOTE — ED Notes (Signed)

## 2017-07-16 ENCOUNTER — Other Ambulatory Visit: Payer: Self-pay

## 2017-07-16 ENCOUNTER — Emergency Department
Admission: EM | Admit: 2017-07-16 | Discharge: 2017-07-16 | Disposition: A | Discharge status: Home / Self Care | Payer: Medicare Other | Attending: Emergency Medicine | Admitting: Emergency Medicine

## 2017-07-16 DIAGNOSIS — R519 Headache, unspecified: Secondary | ICD-10-CM

## 2017-07-16 DIAGNOSIS — R51 Headache: Secondary | ICD-10-CM | POA: Insufficient documentation

## 2017-07-16 DIAGNOSIS — I10 Essential (primary) hypertension: Secondary | ICD-10-CM | POA: Diagnosis not present

## 2017-07-16 DIAGNOSIS — Z7982 Long term (current) use of aspirin: Secondary | ICD-10-CM | POA: Insufficient documentation

## 2017-07-16 DIAGNOSIS — Z87891 Personal history of nicotine dependence: Secondary | ICD-10-CM | POA: Insufficient documentation

## 2017-07-16 DIAGNOSIS — Z79899 Other long term (current) drug therapy: Secondary | ICD-10-CM | POA: Diagnosis not present

## 2017-07-16 MED ORDER — ACETAMINOPHEN 325 MG PO TABS
650.0000 mg | ORAL_TABLET | Freq: Once | ORAL | Status: AC
  Administered 2017-07-16: 650 mg via ORAL
  Filled 2017-07-16: qty 2

## 2017-07-16 MED ORDER — KETOROLAC TROMETHAMINE 30 MG/ML IJ SOLN
30.0000 mg | Freq: Once | INTRAMUSCULAR | Status: AC
Start: 2017-07-16 — End: 2017-07-16
  Administered 2017-07-16: 30 mg via INTRAMUSCULAR
  Filled 2017-07-16: qty 1

## 2017-07-16 MED ORDER — MELOXICAM 15 MG PO TABS: 15.0000 mg | ORAL_TABLET | Freq: Every day | ORAL | 1 refills | Status: AC

## 2017-07-16 NOTE — ED Triage Notes (Signed)
First Nurse Note:  Arrives with c/o headache.  AAOx3.  Skin warm and dry. NAD.  MAE equally and strong.  NAD

## 2017-07-16 NOTE — ED Provider Notes (Signed)
[facility] Emergency Department Provider Note  ____________________________________________  Time seen: Approximately 9:02 PM  I have reviewed the triage vital signs and the nursing notes.   HISTORY  Chief Complaint Headache    HPI Nicholas Reyes is a 62 y.o. male presents to the emergency department with headache that started today.  Patient reports that his current headache feels like similar headaches in the past.  He has tried Excedrin but no other alleviating measures.  Patient denies blurry vision, pain with extraocular eye muscle movement, photophobia and weakness.  Patient has been seen at [facility] for similar symptoms.   Past Medical History:  Diagnosis Date  . Diabetes mellitus   . Hypertension   . Migraines     There are no active problems to display for this patient.   Past Surgical History:  Procedure Laterality Date  . CATARACT EXTRACTION Left 05/28/2017    Prior to Admission medications   Medication Sig Start Date End Date Taking? Authorizing Provider  acetaminophen-codeine (TYLENOL #3) 300-30 MG tablet Take 1 tablet by mouth every 6 (six) hours as needed for moderate pain. 06/14/17   Menshew, Dannielle Karvonen, PA-C  albuterol (PROVENTIL HFA;VENTOLIN HFA) 108 (90 BASE) MCG/ACT inhaler Inhale 2 puffs into the lungs every 4 (four) hours as needed. For shortness of breath    [provider]  aspirin 325 MG tablet Take 325 mg by mouth daily.    [provider]  atorvastatin (LIPITOR) 20 MG tablet Take 20 mg by mouth daily.    [provider]  benazepril (LOTENSIN) 20 MG tablet Take 20 mg by mouth daily.    [provider]  benzonatate (TESSALON PERLES) 100 MG capsule Take 1-2 tabs TID prn cough 06/14/17   Menshew, Dannielle Karvonen, PA-C  budesonide-formoterol (SYMBICORT) 80-4.5 MCG/ACT inhaler Inhale 2 puffs into the lungs 2 (two) times daily.    [provider]  citalopram (CELEXA) 20 MG tablet Take 20 mg  by mouth daily.    [provider]  clopidogrel (PLAVIX) 75 MG tablet Take 75 mg by mouth daily.    [provider]  ibuprofen (ADVIL,MOTRIN) 600 MG tablet Take 600 mg by mouth every 6 (six) hours as needed. For pain    [provider]  isosorbide mononitrate (IMDUR) 30 MG 24 hr tablet Take 30 mg by mouth daily.    [provider]  meloxicam (MOBIC) 15 MG tablet Take 1 tablet (15 mg total) by mouth daily for 7 days. 07/16/17 07/23/17  Lannie Fields, PA-C  metoprolol tartrate (LOPRESSOR) 25 MG tablet Take 25 mg by mouth 2 (two) times daily.    [provider]  naproxen (NAPROSYN) 500 MG tablet Take 1 tablet (500 mg total) by mouth 2 (two) times daily with a meal. 05/14/17   Lavonia Drafts, MD  nitroGLYCERIN (NITROSTAT) 0.4 MG SL tablet Place 0.4 mg under the tongue every 5 (five) minutes as needed. For chest pain    [provider]  ondansetron (ZOFRAN ODT) 4 MG disintegrating tablet Take 1 tablet (4 mg total) by mouth every 8 (eight) hours as needed. 06/14/17   Menshew, Dannielle Karvonen, PA-C  oxyCODONE-acetaminophen (PERCOCET) 7.5-325 MG tablet Take 1 tablet by mouth every 6 (six) hours as needed for severe pain. 06/02/17   Sable Feil, PA-C  triamcinolone cream (KENALOG) 0.1 % Apply 1 application topically 2 (two) times daily.    [provider]    Allergies Patient has no known allergies.  No family history on file.  Social History Social History   Tobacco Use  . Smoking status: Former Research scientist (life sciences)  . Smokeless tobacco: Never Used  Substance Use Topics  . Alcohol use: No    Frequency: Never  . Drug use: No     Review of Systems  Constitutional: No fever/chills Eyes: No visual changes. No discharge ENT: No upper respiratory complaints. Cardiovascular: no chest pain. Respiratory: no cough. No SOB. Gastrointestinal: No abdominal pain.  No nausea, no vomiting.  No diarrhea.  No constipation. Genitourinary: Negative for  dysuria. No hematuria Musculoskeletal: Negative for musculoskeletal pain. Skin: Negative for rash, abrasions, lacerations, ecchymosis. Neurological: Patient has headache, no focal weakness or numbness.   ____________________________________________   PHYSICAL EXAM:  VITAL SIGNS: ED Triage Vitals  Enc Vitals Group     BP 07/16/17 1909 126/69     Pulse Rate 07/16/17 1909 79     Resp 07/16/17 1909 18     Temp 07/16/17 1909 98.5 F (36.9 C)     Temp Source 07/16/17 1909 Oral     SpO2 07/16/17 1909 93 %     Weight 07/16/17 1911 230 lb (104.3 kg)     Height 07/16/17 1911 5\' 7"  (1.702 m)     Head Circumference --      Peak Flow --      Pain Score 07/16/17 1909 9     Pain Loc --      Pain Edu? --      Excl. in [employer]? --      Constitutional: Alert and oriented. Well appearing and in no acute distress. Eyes: Conjunctivae are normal. PERRL. EOMI. Head: Atraumatic. ENT:      Ears: TMs are pearly.      Nose: No congestion/rhinnorhea.      Mouth/Throat: Mucous membranes are moist.  Neck: No stridor.  No cervical spine tenderness to palpation. Hematological/Lymphatic/Immunilogical: No cervical lymphadenopathy. Cardiovascular: Normal rate, regular rhythm. Normal S1 and S2.  Good peripheral circulation. Respiratory: Normal respiratory effort without tachypnea or retractions. Lungs CTAB. Good air entry to the bases with no decreased or absent breath sounds. Gastrointestinal: Bowel sounds 4 quadrants. Soft and nontender to palpation. No guarding or rigidity. No palpable masses. No distention. No CVA tenderness. Musculoskeletal: Full range of motion to all extremities. No gross deformities appreciated. Neurologic:  Normal speech and language. No gross focal neurologic deficits are appreciated.  Skin:  Skin is warm, dry and intact. No rash noted. Psychiatric: Mood and affect are normal. Speech and behavior are normal. Patient exhibits appropriate insight and  judgement.   ____________________________________________   LABS (all labs ordered are listed, but only abnormal results are displayed)  Labs Reviewed - No data to display ____________________________________________  EKG   ____________________________________________  RADIOLOGY   No results found.  ____________________________________________    PROCEDURES  Procedure(s) performed:    Procedures    Medications  ketorolac (TORADOL) 30 MG/ML injection 30 mg (30 mg Intramuscular Given 07/16/17 2000)  acetaminophen (TYLENOL) tablet 650 mg (650 mg Oral Given 07/16/17 2000)     ____________________________________________   INITIAL IMPRESSION / ASSESSMENT AND PLAN / ED COURSE  Pertinent labs & imaging results that were available during my care of the patient were reviewed by me and considered in my medical decision making (see chart for details).  Review of the [place] CSRS was performed in accordance of the [place] prior to dispensing any controlled drugs.    Assessment and plan Headache Patient presents to the emergency department  with frontal headache that resolved after Toradol and Tylenol were administered.  Patient was discharged with meloxicam and advised to follow-up with primary care.  Vital signs are reassuring prior to discharge.  All patient questions were answered.    ____________________________________________  FINAL CLINICAL IMPRESSION(S) / ED DIAGNOSES  Final diagnoses:  Acute nonintractable headache, unspecified headache type      NEW MEDICATIONS STARTED DURING THIS VISIT:  ED Discharge Orders        Ordered    meloxicam (MOBIC) 15 MG tablet  Daily     07/16/17 2053          This chart was dictated using voice recognition software/Dragon. Despite best efforts to proofread, errors can occur which can change the meaning. Any change was purely unintentional.    Lannie Fields, PA-C 07/16/17 2108    Darel Hong, MD 07/17/17  0006

## 2017-07-16 NOTE — ED Notes (Signed)
See triage and provider note

## 2017-07-16 NOTE — ED Triage Notes (Addendum)
Pt arrives to ED via POV from home with c/o headache x2 hrs; pt denies use of OTC medications for headache PTA.  Pt denies c/o N/V/D, no fever, no SHOB, no ABD pain. Pt denies any c/o blurry vision; no facial droop, MAEW, grips are strong and equal bilaterally. Pt reports h/x of migraines and states presenting s/x's are consistent with previous migraines. Pt also with c/o arthritis pain in both knees.

## 2017-08-13 MED ORDER — AMLODIPINE 5 MG TABLET
ORAL_TABLET | 2 refills | 0 days | Status: CP
Start: 2017-08-13 — End: ?

## 2017-08-13 MED ORDER — FUROSEMIDE 40 MG TABLET
ORAL_TABLET | 2 refills | 0 days | Status: CP
Start: 2017-08-13 — End: ?

## 2017-08-15 ENCOUNTER — Telehealth: Payer: Self-pay | Admitting: Internal Medicine

## 2017-08-15 NOTE — Telephone Encounter (Unsigned)
Copied from [place]. Topic: Appointment Scheduling - New Patient >> Aug 15, 2017  2:01 PM Hewitt Shorts wrote: Pt is looking for a new PCP any one that will be willing take him on   Best number [phone]

## 2017-09-05 ENCOUNTER — Other Ambulatory Visit: Payer: Self-pay

## 2017-09-05 ENCOUNTER — Encounter: Payer: Self-pay | Admitting: *Deleted

## 2017-09-05 DIAGNOSIS — Z7982 Long term (current) use of aspirin: Secondary | ICD-10-CM | POA: Insufficient documentation

## 2017-09-05 DIAGNOSIS — E119 Type 2 diabetes mellitus without complications: Secondary | ICD-10-CM | POA: Insufficient documentation

## 2017-09-05 DIAGNOSIS — Z79899 Other long term (current) drug therapy: Secondary | ICD-10-CM | POA: Diagnosis not present

## 2017-09-05 DIAGNOSIS — R300 Dysuria: Secondary | ICD-10-CM | POA: Diagnosis not present

## 2017-09-05 DIAGNOSIS — R109 Unspecified abdominal pain: Secondary | ICD-10-CM | POA: Diagnosis not present

## 2017-09-05 DIAGNOSIS — Z87891 Personal history of nicotine dependence: Secondary | ICD-10-CM | POA: Diagnosis not present

## 2017-09-05 DIAGNOSIS — I10 Essential (primary) hypertension: Secondary | ICD-10-CM | POA: Insufficient documentation

## 2017-09-05 DIAGNOSIS — Z7902 Long term (current) use of antithrombotics/antiplatelets: Secondary | ICD-10-CM | POA: Insufficient documentation

## 2017-09-05 DIAGNOSIS — R0781 Pleurodynia: Secondary | ICD-10-CM | POA: Insufficient documentation

## 2017-09-05 LAB — URINALYSIS, COMPLETE (UACMP) WITH MICROSCOPIC
BILIRUBIN URINE: NEGATIVE
Glucose, UA: NEGATIVE mg/dL
Hgb urine dipstick: NEGATIVE
Ketones, ur: NEGATIVE mg/dL
LEUKOCYTES UA: NEGATIVE
Nitrite: NEGATIVE
Protein, ur: NEGATIVE mg/dL
SPECIFIC GRAVITY, URINE: 1.013 (ref 1.005–1.030)
SQUAMOUS EPITHELIAL / LPF: NONE SEEN (ref 0–5)
pH: 7 (ref 5.0–8.0)

## 2017-09-05 NOTE — ED Triage Notes (Signed)
Pt to ED reporting bilateral flank pain for the past couple days. Pain is intermittent and worse when lying down. No fevers reported. Pt reports dysuria, no blood noted. No NVD. No abd pain.

## 2017-09-06 ENCOUNTER — Emergency Department
Admission: EM | Admit: 2017-09-06 | Discharge: 2017-09-06 | Disposition: A | Discharge status: Home / Self Care | Payer: Medicare Other | Attending: Emergency Medicine | Admitting: Emergency Medicine

## 2017-09-06 ENCOUNTER — Emergency Department: Payer: Medicare Other

## 2017-09-06 DIAGNOSIS — R109 Unspecified abdominal pain: Secondary | ICD-10-CM

## 2017-09-06 DIAGNOSIS — R0781 Pleurodynia: Secondary | ICD-10-CM

## 2017-09-06 LAB — BASIC METABOLIC PANEL
ANION GAP: 7 (ref 5–15)
BUN: 11 mg/dL (ref 6–20)
CALCIUM: 9.2 mg/dL (ref 8.9–10.3)
CO2: 33 mmol/L — ABNORMAL HIGH (ref 22–32)
Chloride: 99 mmol/L — ABNORMAL LOW (ref 101–111)
Creatinine, Ser: 0.86 mg/dL (ref 0.61–1.24)
GFR calc Af Amer: >60 mL/min (ref >60)
Glucose, Bld: 122 mg/dL — ABNORMAL HIGH (ref 65–99)
POTASSIUM: 3.5 mmol/L (ref 3.5–5.1)
SODIUM: 139 mmol/L (ref 135–145)

## 2017-09-06 LAB — CBC WITH DIFFERENTIAL/PLATELET
BASOS ABS: 0.1 10*3/uL (ref 0–0.1)
BASOS PCT: 1 %
EOS ABS: 0.3 10*3/uL (ref 0–0.7)
EOS PCT: 3 %
HCT: 43.4 % (ref 40.0–52.0)
Hemoglobin: 14.1 g/dL (ref 13.0–18.0)
Lymphocytes Relative: 31 %
Lymphs Abs: 2.6 10*3/uL (ref 1.0–3.6)
MCH: 29.5 pg (ref 26.0–34.0)
MCHC: 32.5 g/dL (ref 32.0–36.0)
MCV: 90.6 fL (ref 80.0–100.0)
MONO ABS: 0.7 10*3/uL (ref 0.2–1.0)
Monocytes Relative: 9 %
Neutro Abs: 4.8 10*3/uL (ref 1.4–6.5)
Neutrophils Relative %: 56 %
PLATELETS: 184 10*3/uL (ref 150–440)
RBC: 4.79 MIL/uL (ref 4.40–5.90)
RDW: 18.2 % — AB (ref 11.5–14.5)
WBC: 8.6 10*3/uL (ref 3.8–10.6)

## 2017-09-06 MED ORDER — KETOROLAC TROMETHAMINE 30 MG/ML IJ SOLN
10.0000 mg | Freq: Once | INTRAMUSCULAR | Status: AC
  Administered 2017-09-06: 9.9 mg via INTRAVENOUS
  Filled 2017-09-06: qty 1

## 2017-09-06 MED ORDER — CYCLOBENZAPRINE HCL 5 MG PO TABS: ORAL_TABLET | ORAL | 0 refills | Status: DC

## 2017-09-06 MED ORDER — SODIUM CHLORIDE 0.9 % IV BOLUS
1000.0000 mL | Freq: Once | INTRAVENOUS | Status: AC
  Administered 2017-09-06: 1000 mL via INTRAVENOUS

## 2017-09-06 MED ORDER — IBUPROFEN 800 MG PO TABS: 800.0000 mg | ORAL_TABLET | Freq: Three times a day (TID) | ORAL | 0 refills | Status: DC | PRN

## 2017-09-06 NOTE — ED Notes (Signed)
Patient ambulatory to lobby with steady gait and NAD noted. Verbalized understanding of discharge instructions and follow-up care.  

## 2017-09-06 NOTE — Discharge Instructions (Addendum)
1.  You may take medicines as needed for pain and muscle spasms (Motrin/Flexeril #15). 2.  Apply moist heat to affected area several times daily. 3.  Return to the ER for worsening symptoms, persistent vomiting, difficulty breathing or other concerns.

## 2017-09-06 NOTE — ED Provider Notes (Signed)
[facility] Emergency Department Provider Note   ____________________________________________   First MD Initiated Contact with Patient 09/06/17 0111     (approximate)  I have reviewed the triage vital signs and the nursing notes.   HISTORY  Chief Complaint Flank Pain    HPI Nicholas Reyes is a 62 y.o. male who presents to the ED from home with a chief complaint of bilateral flank pain.  Patient reports nontraumatic bilateral flank pain for the past 2 days.  Complains of symmetrical, sharp pain bilaterally which is intermittent and worse when lying down.  Denies fall/injury/lifting/straining.  Denies recent fever, chills, cough, chest pain, shortness of breath, abdominal pain, nausea, vomiting, hematuria.  Patient does note some dysuria.  Denies recent travel.   Past Medical History:  Diagnosis Date  . Diabetes mellitus   . Hypertension   . Migraines     There are no active problems to display for this patient.   Past Surgical History:  Procedure Laterality Date  . CATARACT EXTRACTION Left 05/28/2017    Prior to Admission medications   Medication Sig Start Date End Date Taking? Authorizing Provider  acetaminophen-codeine (TYLENOL #3) 300-30 MG tablet Take 1 tablet by mouth every 6 (six) hours as needed for moderate pain. 06/14/17   Menshew, Dannielle Karvonen, PA-C  albuterol (PROVENTIL HFA;VENTOLIN HFA) 108 (90 BASE) MCG/ACT inhaler Inhale 2 puffs into the lungs every 4 (four) hours as needed. For shortness of breath    [provider]  aspirin 325 MG tablet Take 325 mg by mouth daily.    [provider]  atorvastatin (LIPITOR) 20 MG tablet Take 20 mg by mouth daily.    [provider]  benazepril (LOTENSIN) 20 MG tablet Take 20 mg by mouth daily.    [provider]  benzonatate (TESSALON PERLES) 100 MG capsule Take 1-2 tabs TID prn cough 06/14/17   Menshew, Dannielle Karvonen, PA-C  budesonide-formoterol (SYMBICORT)  80-4.5 MCG/ACT inhaler Inhale 2 puffs into the lungs 2 (two) times daily.    [provider]  citalopram (CELEXA) 20 MG tablet Take 20 mg by mouth daily.    [provider]  clopidogrel (PLAVIX) 75 MG tablet Take 75 mg by mouth daily.    [provider]  cyclobenzaprine (FLEXERIL) 5 MG tablet 1 tablet every 8 hours as needed for muscle spasms 09/06/17   Paulette Blanch, MD  ibuprofen (ADVIL,MOTRIN) 800 MG tablet Take 1 tablet (800 mg total) by mouth every 8 (eight) hours as needed for moderate pain. 09/06/17   Paulette Blanch, MD  isosorbide mononitrate (IMDUR) 30 MG 24 hr tablet Take 30 mg by mouth daily.    [provider]  metoprolol tartrate (LOPRESSOR) 25 MG tablet Take 25 mg by mouth 2 (two) times daily.    [provider]  naproxen (NAPROSYN) 500 MG tablet Take 1 tablet (500 mg total) by mouth 2 (two) times daily with a meal. 05/14/17   Lavonia Drafts, MD  nitroGLYCERIN (NITROSTAT) 0.4 MG SL tablet Place 0.4 mg under the tongue every 5 (five) minutes as needed. For chest pain    [provider]  ondansetron (ZOFRAN ODT) 4 MG disintegrating tablet Take 1 tablet (4 mg total) by mouth every 8 (eight) hours as needed. 06/14/17   Menshew, Dannielle Karvonen, PA-C  oxyCODONE-acetaminophen (PERCOCET) 7.5-325 MG tablet Take 1 tablet by mouth every 6 (six) hours as needed for severe pain. 06/02/17   Sable Feil, PA-C  triamcinolone cream (KENALOG) 0.1 % Apply 1 application topically 2 (two) times daily.    [provider]    Allergies Patient has no known allergies.  History reviewed. No pertinent family history.  Social History Social History   Tobacco Use  . Smoking status: Former Research scientist (life sciences)  . Smokeless tobacco: Never Used  Substance Use Topics  . Alcohol use: No    Frequency: Never  . Drug use: No    Review of Systems  Constitutional: No fever/chills. Eyes: No visual changes. ENT: No sore throat. Cardiovascular: Denies chest  pain. Respiratory: Denies shortness of breath. Gastrointestinal: Positive for bilateral flank pain.  No abdominal pain.  No nausea, no vomiting.  No diarrhea.  No constipation. Genitourinary: Positive for dysuria. Musculoskeletal: Negative for back pain. Skin: Negative for rash. Neurological: Negative for headaches, focal weakness or numbness.   ____________________________________________   PHYSICAL EXAM:  VITAL SIGNS: ED Triage Vitals  Enc Vitals Group     BP 09/05/17 2246 (!) 151/76     Pulse Rate 09/05/17 2246 87     Resp 09/05/17 2246 16     Temp 09/05/17 2246 98.1 F (36.7 C)     Temp Source 09/05/17 2246 Oral     SpO2 09/05/17 2246 96 %     Weight 09/05/17 2248 230 lb (104.3 kg)     Height --      Head Circumference --      Peak Flow --      Pain Score 09/05/17 2246 9     Pain Loc --      Pain Edu? --      Excl. in [place]? --     Constitutional: Alert and oriented. Well appearing and in no acute distress. Eyes: Conjunctivae are normal. PERRL. EOMI. Head: Atraumatic. Nose: No congestion/rhinnorhea. Mouth/Throat: Mucous membranes are moist.  Oropharynx non-erythematous. Neck: No stridor.  No cervical spine tenderness to palpation. Cardiovascular: Normal rate, regular rhythm. Grossly normal heart sounds.  Good peripheral circulation. Respiratory: Normal respiratory effort.  No retractions. Lungs CTAB.  Bilateral lower ribs tender to palpation.  No crepitus.  No splinting. Gastrointestinal: Soft and nontender. No distention. No abdominal bruits. No CVA tenderness. Musculoskeletal: No lower extremity tenderness nor edema.  No joint effusions. Neurologic:  Normal speech and language. No gross focal neurologic deficits are appreciated. No gait instability. Skin:  Skin is warm, dry and intact. No rash noted. Psychiatric: Mood and affect are normal. Speech and behavior are normal.  ____________________________________________   LABS (all labs ordered are listed, but only  abnormal results are displayed)  Labs Reviewed  URINALYSIS, COMPLETE (UACMP) WITH MICROSCOPIC - Abnormal; Notable for the following components:      Result Value   Color, Urine YELLOW (*)    APPearance CLEAR (*)    Bacteria, UA RARE (*)    All other components within normal limits  CBC WITH DIFFERENTIAL/PLATELET - Abnormal; Notable for the following components:   RDW 18.2 (*)    All other components within normal limits  BASIC METABOLIC PANEL - Abnormal; Notable for the following components:   Chloride 99 (*)    CO2 33 (*)    Glucose, Bld 122 (*)    All other components within normal limits   ____________________________________________  EKG  None ____________________________________________  RADIOLOGY  ED MD interpretation: No active cardiopulmonary disease, no kidney stones  Official radiology report(s): Dg Chest 2 View  Result Date: 09/06/2017 CLINICAL DATA:  Flank pain and rib pain. EXAM: CHEST - 2  VIEW COMPARISON:  Chest radiograph 06/14/2017 FINDINGS: The heart size and mediastinal contours are within normal limits. Both lungs are clear. The visualized skeletal structures are unremarkable. IMPRESSION: No active cardiopulmonary disease. Electronically Signed   By: Ulyses Jarred M.D.   On: 09/06/2017 01:45   Ct Renal Stone Study  Result Date: 09/06/2017 CLINICAL DATA:  Bilateral flank pain for several days, more so on the right. EXAM: CT ABDOMEN AND PELVIS WITHOUT CONTRAST TECHNIQUE: Multidetector CT imaging of the abdomen and pelvis was performed following the standard protocol without IV contrast. COMPARISON:  Radiographs from 10/24/2010 FINDINGS: Lower chest: Coronary arteriosclerosis within the included top-normal size heart. No pericardial effusion. No active pulmonary disease. Hepatobiliary: The unenhanced liver is unremarkable. Cholecystectomy clips are present. No biliary dilatation. Pancreas: The pancreas is unremarkable. There is no ductal dilatation. Spleen: No  splenomegaly or mass. Adrenals/Urinary Tract: Motion related artifacts limit assessment. Exophytic right upper pole simple renal cyst measuring 15 mm is identified. There is no hydronephrosis nor hydroureter. No ureteral calculus or nephrolithiasis. The urinary bladder is decompressed slightly thick-walled in appearance as a result. Stomach/Bowel: Surgical clips in the right lower quadrant presumably from prior appendectomy. The stomach is decompressed. Normal small bowel rotation without obstruction or inflammation. Moderate amount of fecal retention within the colon. Scattered sigmoid diverticulosis without acute diverticulitis. Vascular/Lymphatic: Mild aortoiliac atherosclerosis without aneurysm. No lymphadenopathy. Reproductive: Normal size prostate with central zone calcifications. Other: No free air nor free fluid. Musculoskeletal: No acute osseous abnormality. Mild physiologic wedging of T10, at T11 and to a lesser degree T12. Lower lumbar facet arthropathy. IMPRESSION: 1. Coronary arteriosclerosis. 2. Minimal aortoiliac atherosclerosis without aneurysm. 3. Sigmoid diverticulosis without acute diverticulitis. 4. No acute osseous appearing abnormality. Lower lumbar facet arthropathy. 5. No nephrolithiasis nor ureteral calculi. No hydroureteronephrosis. Electronically Signed   By: Ashley Royalty M.D.   On: 09/06/2017 02:00    ____________________________________________   PROCEDURES  Procedure(s) performed: None  Procedures  Critical Care performed: No  ____________________________________________   INITIAL IMPRESSION / ASSESSMENT AND PLAN / ED COURSE  As part of my medical decision making, I reviewed the following data within the [place] notes reviewed and incorporated, Labs reviewed, Old chart reviewed, Radiograph reviewed  and Notes from prior ED visits   62 year old male with diabetes and hypertension who presents with nontraumatic bilateral flank and lower  rib pain.  Differential diagnosis includes but is not limited to musculoskeletal strain, kidney stone, rib contusion, PE, etc.  Urinalysis is unremarkable.  Will check basic lab work, chest x-ray, CT renal colic protocol.  Will initiate IV fluid resuscitation; IV Toradol for pain.   Clinical Course as of Sep 06 221  Thu Sep 06, 2017  0220 Patient resting in no acute distress.  Updated him on all test results.  Feel patient's symptoms most likely musculoskeletal.  Will treat with NSAIDs, muscle relaxer and patient will follow up with his PCP next week.  Strict return precautions given.  Patient verbalizes understanding and agrees with plan of care.   [JS]    Clinical Course User Index [JS] Paulette Blanch, MD     ____________________________________________   FINAL CLINICAL IMPRESSION(S) / ED DIAGNOSES  Final diagnoses:  Flank pain  Rib pain     ED Discharge Orders        Ordered    cyclobenzaprine (FLEXERIL) 5 MG tablet     09/06/17 0222    ibuprofen (ADVIL,MOTRIN) 800 MG tablet  Every 8 hours PRN  09/06/17 0222       Note:  This document was prepared using Dragon voice recognition software and may include unintentional dictation errors.    Paulette Blanch, MD 09/06/17 [phone]

## 2017-09-17 ENCOUNTER — Other Ambulatory Visit: Payer: Self-pay

## 2017-09-17 ENCOUNTER — Encounter: Payer: Self-pay | Admitting: Emergency Medicine

## 2017-09-17 DIAGNOSIS — M79642 Pain in left hand: Secondary | ICD-10-CM | POA: Diagnosis present

## 2017-09-17 DIAGNOSIS — M19042 Primary osteoarthritis, left hand: Secondary | ICD-10-CM | POA: Diagnosis not present

## 2017-09-17 DIAGNOSIS — E119 Type 2 diabetes mellitus without complications: Secondary | ICD-10-CM | POA: Insufficient documentation

## 2017-09-17 DIAGNOSIS — Z7982 Long term (current) use of aspirin: Secondary | ICD-10-CM | POA: Diagnosis not present

## 2017-09-17 DIAGNOSIS — Z7902 Long term (current) use of antithrombotics/antiplatelets: Secondary | ICD-10-CM | POA: Insufficient documentation

## 2017-09-17 DIAGNOSIS — I10 Essential (primary) hypertension: Secondary | ICD-10-CM | POA: Insufficient documentation

## 2017-09-17 DIAGNOSIS — Z79899 Other long term (current) drug therapy: Secondary | ICD-10-CM | POA: Insufficient documentation

## 2017-09-17 DIAGNOSIS — Z87891 Personal history of nicotine dependence: Secondary | ICD-10-CM | POA: Diagnosis not present

## 2017-09-17 NOTE — ED Triage Notes (Signed)
Patient ambulatory to triage with steady gait, without difficulty or distress noted; pt reports left hand pain x week; st pain when trying to hold anything; denies any known injury

## 2017-09-18 ENCOUNTER — Emergency Department
Admission: EM | Admit: 2017-09-18 | Discharge: 2017-09-18 | Disposition: A | Discharge status: Home / Self Care | Payer: Medicare Other | Attending: Emergency Medicine | Admitting: Emergency Medicine

## 2017-09-18 ENCOUNTER — Emergency Department: Payer: Medicare Other

## 2017-09-18 DIAGNOSIS — M79642 Pain in left hand: Secondary | ICD-10-CM

## 2017-09-18 DIAGNOSIS — M1990 Unspecified osteoarthritis, unspecified site: Secondary | ICD-10-CM

## 2017-09-18 DIAGNOSIS — M19042 Primary osteoarthritis, left hand: Secondary | ICD-10-CM | POA: Diagnosis not present

## 2017-09-18 MED ORDER — KETOROLAC TROMETHAMINE 60 MG/2ML IM SOLN
60.0000 mg | Freq: Once | INTRAMUSCULAR | Status: AC
  Administered 2017-09-18: 60 mg via INTRAMUSCULAR
  Filled 2017-09-18: qty 2

## 2017-09-18 MED ORDER — TRAMADOL HCL 50 MG PO TABS: 50.0000 mg | ORAL_TABLET | Freq: Four times a day (QID) | ORAL | 0 refills | Status: DC | PRN

## 2017-09-18 MED ORDER — PREDNISONE 10 MG (21) PO TBPK: ORAL_TABLET | ORAL | 0 refills | Status: DC

## 2017-09-18 MED ORDER — PREDNISONE 20 MG PO TABS
60.0000 mg | ORAL_TABLET | Freq: Once | ORAL | Status: AC
  Administered 2017-09-18: 60 mg via ORAL
  Filled 2017-09-18: qty 3

## 2017-09-18 NOTE — ED Notes (Signed)
Patient ambulatory to lobby with steady gait and NAD noted. Verbalized understanding of discharge instructions, follow-up care, and prescriptions.

## 2017-09-18 NOTE — Discharge Instructions (Addendum)
Please follow up with your primary care physician for further evaluation of your symptoms.  °

## 2017-09-18 NOTE — ED Provider Notes (Signed)
[facility] Emergency Department Provider Note   ____________________________________________   First MD Initiated Contact with Patient 09/18/17 [phone]     (approximate)  I have reviewed the triage vital signs and the nursing notes.   HISTORY  Chief Complaint Hand Pain    HPI Nicholas Reyes is a 62 y.o. male who comes into the hospital today with some left hand pain.  The patient states that he can hardly bend it hardly move it.  The symptoms started about 2 to 3 days ago.  The patient has a history of gout and is unsure if this is his gout.  He reports that he is unsure if it swollen.  He found a tick on him the other night and is unsure how long its been there.  The patient denies any fevers any numbness or tingling.  He states that his pain is a 10 out of 10 in intensity currently.  The patient is here today for evaluation.   Past Medical History:  Diagnosis Date  . Diabetes mellitus   . Hypertension   . Migraines     There are no active problems to display for this patient.   Past Surgical History:  Procedure Laterality Date  . CATARACT EXTRACTION Left 05/28/2017    Prior to Admission medications   Medication Sig Start Date End Date Taking? Authorizing Provider  acetaminophen-codeine (TYLENOL #3) 300-30 MG tablet Take 1 tablet by mouth every 6 (six) hours as needed for moderate pain. 06/14/17   Menshew, Dannielle Karvonen, PA-C  albuterol (PROVENTIL HFA;VENTOLIN HFA) 108 (90 BASE) MCG/ACT inhaler Inhale 2 puffs into the lungs every 4 (four) hours as needed. For shortness of breath    [provider]  aspirin 325 MG tablet Take 325 mg by mouth daily.    [provider]  atorvastatin (LIPITOR) 20 MG tablet Take 20 mg by mouth daily.    [provider]  benazepril (LOTENSIN) 20 MG tablet Take 20 mg by mouth daily.    [provider]  benzonatate (TESSALON PERLES) 100 MG capsule Take 1-2 tabs TID prn cough 06/14/17    Menshew, Dannielle Karvonen, PA-C  budesonide-formoterol (SYMBICORT) 80-4.5 MCG/ACT inhaler Inhale 2 puffs into the lungs 2 (two) times daily.    [provider]  citalopram (CELEXA) 20 MG tablet Take 20 mg by mouth daily.    [provider]  clopidogrel (PLAVIX) 75 MG tablet Take 75 mg by mouth daily.    [provider]  cyclobenzaprine (FLEXERIL) 5 MG tablet 1 tablet every 8 hours as needed for muscle spasms 09/06/17   Paulette Blanch, MD  ibuprofen (ADVIL,MOTRIN) 800 MG tablet Take 1 tablet (800 mg total) by mouth every 8 (eight) hours as needed for moderate pain. 09/06/17   Paulette Blanch, MD  isosorbide mononitrate (IMDUR) 30 MG 24 hr tablet Take 30 mg by mouth daily.    [provider]  metoprolol tartrate (LOPRESSOR) 25 MG tablet Take 25 mg by mouth 2 (two) times daily.    [provider]  naproxen (NAPROSYN) 500 MG tablet Take 1 tablet (500 mg total) by mouth 2 (two) times daily with a meal. 05/14/17   Lavonia Drafts, MD  nitroGLYCERIN (NITROSTAT) 0.4 MG SL tablet Place 0.4 mg under the tongue every 5 (five) minutes as needed. For chest pain    [provider]  ondansetron (ZOFRAN ODT) 4 MG disintegrating tablet Take 1 tablet (4 mg total) by mouth every 8 (  eight) hours as needed. 06/14/17   Menshew, Dannielle Karvonen, PA-C  oxyCODONE-acetaminophen (PERCOCET) 7.5-325 MG tablet Take 1 tablet by mouth every 6 (six) hours as needed for severe pain. 06/02/17   Sable Feil, PA-C  predniSONE (STERAPRED UNI-PAK 21 TAB) 10 MG (21) TBPK tablet Take 6 tabs on day 1 Take 5 tabs on day 2 Take 4 tabs on day 3 Take 3 tabs on day 4 Take 2 tabs on day 5 Take 1 tab on day 6 09/18/17   Loney Hering, MD  traMADol (ULTRAM) 50 MG tablet Take 1 tablet (50 mg total) by mouth every 6 (six) hours as needed. 09/18/17   Loney Hering, MD  triamcinolone cream (KENALOG) 0.1 % Apply 1 application topically 2 (two) times daily.    [provider]     Allergies Patient has no known allergies.  No family history on file.  Social History Social History   Tobacco Use  . Smoking status: Former Research scientist (life sciences)  . Smokeless tobacco: Never Used  Substance Use Topics  . Alcohol use: No    Frequency: Never  . Drug use: No    Review of Systems  Constitutional: No fever/chills Eyes: No visual changes. ENT: No sore throat. Cardiovascular: Denies chest pain. Respiratory: Denies shortness of breath. Gastrointestinal: No abdominal pain.  No nausea, no vomiting.  No diarrhea.  No constipation. Genitourinary: Negative for dysuria. Musculoskeletal: Left hand pain Skin: Negative for rash. Neurological: Negative for headaches   ____________________________________________   PHYSICAL EXAM:  VITAL SIGNS: ED Triage Vitals [09/17/17 2328]  Enc Vitals Group     BP (!) 150/71     Pulse Rate 93     Resp 18     Temp 98.2 F (36.8 C)     Temp Source Oral     SpO2 95 %     Weight 240 lb (108.9 kg)     Height 5\' 7"  (1.702 m)     Head Circumference      Peak Flow      Pain Score 8     Pain Loc      Pain Edu?      Excl. in [place]?     Constitutional: Alert and oriented. Well appearing and in moderate distress. Eyes: Conjunctivae are normal. PERRL. EOMI. Head: Atraumatic. Nose: No congestion/rhinnorhea. Mouth/Throat: Mucous membranes are moist.  Oropharynx non-erythematous. Cardiovascular: Normal rate, regular rhythm. Grossly normal heart sounds.  Good peripheral circulation. Respiratory: Normal respiratory effort.  No retractions. Lungs CTAB. Gastrointestinal: Soft and nontender. No distention.  Positive bowel sounds Musculoskeletal: Pain with range of motion of left hand, no warmth no significant swelling no significant tenderness to palpation Neurologic:  Normal speech and language.  Skin:  Skin is warm, dry and intact.  Psychiatric: Mood and affect are normal.   ____________________________________________   LABS (all labs  ordered are listed, but only abnormal results are displayed)  Labs Reviewed - No data to display ____________________________________________  EKG  none ____________________________________________  RADIOLOGY  ED MD interpretation: Left hand x-ray: Negative  Official radiology report(s): Dg Hand Complete Left  Result Date: 09/18/2017 CLINICAL DATA:  Left hand pain EXAM: LEFT HAND - COMPLETE 3+ VIEW COMPARISON:  None. FINDINGS: There is no evidence of fracture or dislocation. There is no evidence of arthropathy or other focal bone abnormality. Soft tissues are unremarkable. IMPRESSION: Negative. Electronically Signed   By: Donavan Foil M.D.   On: 09/18/2017 03:34    ____________________________________________   PROCEDURES  Procedure(s) performed: None  Procedures  Critical Care performed: No  ____________________________________________   INITIAL IMPRESSION / ASSESSMENT AND PLAN / ED COURSE  As part of my medical decision making, I reviewed the following data within the electronic MEDICAL RECORD NUMBER Notes from prior ED visits and [place] Controlled Substance Database   This is a 62 year old male who comes into the hospital today with some left hand pain.  He reports that he can barely close his hand because of pain.  I did send the patient for an x-ray and it was negative.  I gave the patient a shot of Toradol and some prednisone orally.  I went back to check on the patient and he was leaning on his hand and moving it without any difficulty.  The patient's pain is significantly improved.  He will be discharged home and encouraged to follow-up with his primary care physician or the acute care clinic.  Since he did not have any warmth or swelling I feel like he may be has some arthritis versus gout.  He will be discharged to follow-up.      ____________________________________________   FINAL CLINICAL IMPRESSION(S) / ED DIAGNOSES  Final diagnoses:  Left hand pain   Arthritis     ED Discharge Orders        Ordered    predniSONE (STERAPRED UNI-PAK 21 TAB) 10 MG (21) TBPK tablet     09/18/17 0429    traMADol (ULTRAM) 50 MG tablet  Every 6 hours PRN     09/18/17 0429       Note:  This document was prepared using Dragon voice recognition software and may include unintentional dictation errors.    Loney Hering, MD 09/18/17 [phone]

## 2017-09-24 ENCOUNTER — Other Ambulatory Visit: Payer: Self-pay

## 2017-09-24 DIAGNOSIS — R109 Unspecified abdominal pain: Secondary | ICD-10-CM | POA: Insufficient documentation

## 2017-09-24 DIAGNOSIS — E119 Type 2 diabetes mellitus without complications: Secondary | ICD-10-CM | POA: Diagnosis not present

## 2017-09-24 DIAGNOSIS — Z7982 Long term (current) use of aspirin: Secondary | ICD-10-CM | POA: Diagnosis not present

## 2017-09-24 DIAGNOSIS — M25561 Pain in right knee: Secondary | ICD-10-CM | POA: Insufficient documentation

## 2017-09-24 DIAGNOSIS — Z79899 Other long term (current) drug therapy: Secondary | ICD-10-CM | POA: Insufficient documentation

## 2017-09-24 DIAGNOSIS — I10 Essential (primary) hypertension: Secondary | ICD-10-CM | POA: Diagnosis not present

## 2017-09-24 DIAGNOSIS — Z87891 Personal history of nicotine dependence: Secondary | ICD-10-CM | POA: Insufficient documentation

## 2017-09-24 LAB — COMPREHENSIVE METABOLIC PANEL
ALBUMIN: 3.8 g/dL (ref 3.5–5.0)
ALK PHOS: 73 U/L (ref 38–126)
ALT: 16 U/L — AB (ref 17–63)
AST: 17 U/L (ref 15–41)
Anion gap: 7 (ref 5–15)
BILIRUBIN TOTAL: 0.5 mg/dL (ref 0.3–1.2)
BUN: 12 mg/dL (ref 6–20)
CALCIUM: 8.7 mg/dL — AB (ref 8.9–10.3)
CO2: 30 mmol/L (ref 22–32)
CREATININE: 0.85 mg/dL (ref 0.61–1.24)
Chloride: 104 mmol/L (ref 101–111)
GFR calc Af Amer: >60 mL/min (ref >60)
GFR calc non Af Amer: >60 mL/min (ref >60)
GLUCOSE: 118 mg/dL — AB (ref 65–99)
Potassium: 3.3 mmol/L — ABNORMAL LOW (ref 3.5–5.1)
Sodium: 141 mmol/L (ref 135–145)
Total Protein: 7.3 g/dL (ref 6.5–8.1)

## 2017-09-24 LAB — CBC
HEMATOCRIT: 40.6 % (ref 40.0–52.0)
HEMOGLOBIN: 13.6 g/dL (ref 13.0–18.0)
MCH: 30.5 pg (ref 26.0–34.0)
MCHC: 33.4 g/dL (ref 32.0–36.0)
MCV: 91.2 fL (ref 80.0–100.0)
Platelets: 186 10*3/uL (ref 150–440)
RBC: 4.45 MIL/uL (ref 4.40–5.90)
RDW: 17.5 % — ABNORMAL HIGH (ref 11.5–14.5)
WBC: 10.5 10*3/uL (ref 3.8–10.6)

## 2017-09-24 NOTE — ED Triage Notes (Signed)
Pt in with co right flank pain denies any dysuria. States has hx of cyst to right kidney.

## 2017-09-25 ENCOUNTER — Emergency Department
Admission: EM | Admit: 2017-09-25 | Discharge: 2017-09-25 | Disposition: A | Discharge status: Home / Self Care | Payer: Medicare Other | Attending: Emergency Medicine | Admitting: Emergency Medicine

## 2017-09-25 ENCOUNTER — Emergency Department: Payer: Medicare Other

## 2017-09-25 DIAGNOSIS — R109 Unspecified abdominal pain: Secondary | ICD-10-CM

## 2017-09-25 LAB — URINALYSIS, COMPLETE (UACMP) WITH MICROSCOPIC
BACTERIA UA: NONE SEEN
BILIRUBIN URINE: NEGATIVE
GLUCOSE, UA: NEGATIVE mg/dL
Hgb urine dipstick: NEGATIVE
Ketones, ur: 5 mg/dL — AB
LEUKOCYTES UA: NEGATIVE
NITRITE: NEGATIVE
PROTEIN: NEGATIVE mg/dL
SPECIFIC GRAVITY, URINE: 1.023 (ref 1.005–1.030)
pH: 6 (ref 5.0–8.0)

## 2017-09-25 MED ORDER — SODIUM CHLORIDE 0.9 % IV BOLUS
1000.0000 mL | Freq: Once | INTRAVENOUS | Status: AC
Start: 2017-09-25 — End: 2017-09-25
  Administered 2017-09-25: 1000 mL via INTRAVENOUS

## 2017-09-25 MED ORDER — KETOROLAC TROMETHAMINE 30 MG/ML IJ SOLN
30.0000 mg | Freq: Once | INTRAMUSCULAR | Status: AC
  Administered 2017-09-25: 30 mg via INTRAVENOUS
  Filled 2017-09-25: qty 1

## 2017-09-25 NOTE — ED Provider Notes (Signed)
[facility] Emergency Department Provider Note   ____________________________________________   First MD Initiated Contact with Patient 09/25/17 0309     (approximate)  I have reviewed the triage vital signs and the nursing notes.   HISTORY  Chief Complaint Flank Pain    HPI Nicholas Reyes is a 62 y.o. male who comes into the hospital today with some right-sided pain and right knee pain.  The patient states it started suddenly on Sunday.  He reports that whenever he tries to lay down it hurts.  He cannot get out of pain.  He has not taken anything for pain because he does not have anything at home to take.  The patient states that he is tired of hurting.  He rates his pain a 10 out of 10 in intensity and reports that sharp.  Sometimes he states that he gets pain with urination and he reports he is never had this flank pain before.  He is here for evaluation.  Past Medical History:  Diagnosis Date  . Diabetes mellitus   . Hypertension   . Migraines     There are no active problems to display for this patient.   Past Surgical History:  Procedure Laterality Date  . CATARACT EXTRACTION Left 05/28/2017    Prior to Admission medications   Medication Sig Start Date End Date Taking? Authorizing Provider  acetaminophen-codeine (TYLENOL #3) 300-30 MG tablet Take 1 tablet by mouth every 6 (six) hours as needed for moderate pain. 06/14/17   Menshew, Dannielle Karvonen, PA-C  albuterol (PROVENTIL HFA;VENTOLIN HFA) 108 (90 BASE) MCG/ACT inhaler Inhale 2 puffs into the lungs every 4 (four) hours as needed. For shortness of breath    [provider]  aspirin 325 MG tablet Take 325 mg by mouth daily.    [provider]  atorvastatin (LIPITOR) 20 MG tablet Take 20 mg by mouth daily.    [provider]  benazepril (LOTENSIN) 20 MG tablet Take 20 mg by mouth daily.    [provider]  benzonatate (TESSALON PERLES) 100 MG capsule Take 1-2  tabs TID prn cough 06/14/17   Menshew, Dannielle Karvonen, PA-C  budesonide-formoterol (SYMBICORT) 80-4.5 MCG/ACT inhaler Inhale 2 puffs into the lungs 2 (two) times daily.    [provider]  citalopram (CELEXA) 20 MG tablet Take 20 mg by mouth daily.    [provider]  clopidogrel (PLAVIX) 75 MG tablet Take 75 mg by mouth daily.    [provider]  cyclobenzaprine (FLEXERIL) 5 MG tablet 1 tablet every 8 hours as needed for muscle spasms 09/06/17   Paulette Blanch, MD  ibuprofen (ADVIL,MOTRIN) 800 MG tablet Take 1 tablet (800 mg total) by mouth every 8 (eight) hours as needed for moderate pain. 09/06/17   Paulette Blanch, MD  isosorbide mononitrate (IMDUR) 30 MG 24 hr tablet Take 30 mg by mouth daily.    [provider]  metoprolol tartrate (LOPRESSOR) 25 MG tablet Take 25 mg by mouth 2 (two) times daily.    [provider]  naproxen (NAPROSYN) 500 MG tablet Take 1 tablet (500 mg total) by mouth 2 (two) times daily with a meal. 05/14/17   Lavonia Drafts, MD  nitroGLYCERIN (NITROSTAT) 0.4 MG SL tablet Place 0.4 mg under the tongue every 5 (five) minutes as needed. For chest pain    [provider]  ondansetron (ZOFRAN ODT) 4 MG disintegrating tablet Take 1 tablet (4 mg total) by mouth every  8 (eight) hours as needed. 06/14/17   Menshew, Dannielle Karvonen, PA-C  oxyCODONE-acetaminophen (PERCOCET) 7.5-325 MG tablet Take 1 tablet by mouth every 6 (six) hours as needed for severe pain. 06/02/17   Sable Feil, PA-C  predniSONE (STERAPRED UNI-PAK 21 TAB) 10 MG (21) TBPK tablet Take 6 tabs on day 1 Take 5 tabs on day 2 Take 4 tabs on day 3 Take 3 tabs on day 4 Take 2 tabs on day 5 Take 1 tab on day 6 09/18/17   Loney Hering, MD  traMADol (ULTRAM) 50 MG tablet Take 1 tablet (50 mg total) by mouth every 6 (six) hours as needed. 09/18/17   Loney Hering, MD  triamcinolone cream (KENALOG) 0.1 % Apply 1 application topically 2 (two) times daily.    [provider]    Allergies Patient has no known allergies.  No family history on file.  Social History Social History   Tobacco Use  . Smoking status: Former Research scientist (life sciences)  . Smokeless tobacco: Never Used  Substance Use Topics  . Alcohol use: No    Frequency: Never  . Drug use: No    Review of Systems  Constitutional: No fever/chills Eyes: No visual changes. ENT: No sore throat. Cardiovascular: Denies chest pain. Respiratory: Denies shortness of breath. Gastrointestinal: Right side/flank pain. Genitourinary: Negative for dysuria. Musculoskeletal: Negative for back pain. Skin: Negative for rash. Neurological: Negative for headaches   ____________________________________________   PHYSICAL EXAM:  VITAL SIGNS: ED Triage Vitals [09/24/17 2302]  Enc Vitals Group     BP (!) 140/94     Pulse Rate 99     Resp 18     Temp 98.7 F (37.1 C)     Temp Source Oral     SpO2 97 %     Weight 240 lb (108.9 kg)     Height 5\' 7"  (1.702 m)     Head Circumference      Peak Flow      Pain Score 9     Pain Loc      Pain Edu?      Excl. in [place]?    Constitutional: Alert and oriented. Well appearing and in moderate distress. Eyes: Conjunctivae are normal. PERRL. EOMI. Head: Atraumatic. Nose: No congestion/rhinnorhea. Mouth/Throat: Mucous membranes are moist.  Oropharynx non-erythematous. Cardiovascular: Normal rate, regular rhythm. Grossly normal heart sounds.  Good peripheral circulation. Respiratory: Normal respiratory effort.  No retractions. Lungs CTAB. Gastrointestinal: Soft with some mild tenderness to palpation of the right side. No distention.  No distinct CVA tenderness to palpation Musculoskeletal: No lower extremity tenderness nor edema.   Neurologic:  Normal speech and language.  Skin:  Skin is warm, dry and intact.  Psychiatric: Mood and affect are normal.   ____________________________________________   LABS (all labs ordered are listed, but only abnormal results  are displayed)  Labs Reviewed  CBC - Abnormal; Notable for the following components:      Result Value   RDW 17.5 (*)    All other components within normal limits  COMPREHENSIVE METABOLIC PANEL - Abnormal; Notable for the following components:   Potassium 3.3 (*)    Glucose, Bld 118 (*)    Calcium 8.7 (*)    ALT 16 (*)    All other components within normal limits  URINALYSIS, COMPLETE (UACMP) WITH MICROSCOPIC - Abnormal; Notable for the following components:   Color, Urine YELLOW (*)    APPearance CLEAR (*)    Ketones, ur 5 (*)  All other components within normal limits   ____________________________________________  EKG  none ____________________________________________  RADIOLOGY  ED MD interpretation:  CT renal stone study: No renal stones or obstructive uropathy, no acute abnormality in the abdomen/pelvis, colonic diverticulosis without diverticulitis seen.  Official radiology report(s): Ct Renal Stone Study  Result Date: 09/25/2017 CLINICAL DATA:  Flank pain.  Right flank pain. EXAM: CT ABDOMEN AND PELVIS WITHOUT CONTRAST TECHNIQUE: Multidetector CT imaging of the abdomen and pelvis was performed following the standard protocol without IV contrast. COMPARISON:  CT 09/06/2017 FINDINGS: Lower chest: No consolidation or pleural fluid. Coronary artery calcifications are seen. Hepatobiliary: No focal lesion on noncontrast exam. Clips in the gallbladder fossa postcholecystectomy. No biliary dilatation. Pancreas: No ductal dilatation or inflammation. Spleen: Normal in size without focal abnormality. Adrenals/Urinary Tract: No adrenal nodule. No renal stones or hydronephrosis. No perinephric edema. Small cyst from the upper right kidney is unchanged. Both ureters are decompressed without stones along the course. Urinary bladder is nondistended. Stomach/Bowel: Colonic diverticulosis without diverticulitis. No bowel wall thickening, inflammatory change or obstruction. Probable prior  appendectomy with clips at the base of the cecum. Stomach is distended with ingested material, no gastric wall thickening. Vascular/Lymphatic: Aorta bi-iliac atherosclerosis. No aneurysm. No enlarged abdominal or pelvic lymph nodes. Reproductive: Prostate is unremarkable. Other: No free air, free fluid, or intra-abdominal fluid collection. Focal well-circumscribed fat density with calcifications in the left lower quadrant adjacent to the sigmoid colon may be sequela of remote prior fat necrosis or epiploic appendagitis. Musculoskeletal: There are no acute or suspicious osseous abnormalities. Stable degenerative change in the lumbar spine. Left L5 pars defect. IMPRESSION: 1. No renal stones or obstructive uropathy. No acute abnormality in the abdomen/pelvis. 2. Again seen colonic diverticulosis without diverticulitis and Aortic Atherosclerosis (ICD10-I70.0). Electronically Signed   By: Jeb Levering M.D.   On: 09/25/2017 04:10    ____________________________________________   PROCEDURES  Procedure(s) performed: None  Procedures  Critical Care performed: No  ____________________________________________   INITIAL IMPRESSION / ASSESSMENT AND PLAN / ED COURSE  As part of my medical decision making, I reviewed the following data within the electronic MEDICAL RECORD NUMBER Notes from prior ED visits and [place] Controlled Substance Database   This is a 62 year old male who comes into the hospital today with some right-sided flank pain.  On my evaluation the patient was crying and stating he was in so much pain.  My differential diagnosis includes kidney stones, urinary tract infection, musculoskeletal pain, choledocholithiasis.  We did check some blood work to include a CBC, CMP, urinalysis which were all unremarkable.  I did send the patient for a CT scan which was negative.  Looking back at the patient's notes he has been here multiple times and has had a recent CT scan.  He was in a significant  amount of pain and crying so I was concerned about a possible stone.  I gave the patient some fluids as well as a dose of Toradol as he drove here.  Once the fluids are finished the patient reports that he was better and he was ready to go.  The patient needs to follow-up with his primary care physician for further evaluation of his pains.  He will be discharged home.      ____________________________________________   FINAL CLINICAL IMPRESSION(S) / ED DIAGNOSES  Final diagnoses:  Flank pain     ED Discharge Orders    None       Note:  This document was prepared using Dragon voice  recognition software and may include unintentional dictation errors.    Loney Hering, MD 09/25/17 [phone]

## 2017-09-25 NOTE — Discharge Instructions (Addendum)
Please follow up with your primary care physician for further eval of your flank pain

## 2017-10-04 ENCOUNTER — Ambulatory Visit: Payer: Medicare Other | Admitting: Internal Medicine

## 2017-10-16 ENCOUNTER — Emergency Department
Admission: EM | Admit: 2017-10-16 | Discharge: 2017-10-16 | Disposition: A | Discharge status: Home / Self Care | Payer: Medicare Other | Attending: Emergency Medicine | Admitting: Emergency Medicine

## 2017-10-16 ENCOUNTER — Other Ambulatory Visit: Payer: Self-pay

## 2017-10-16 DIAGNOSIS — Z87891 Personal history of nicotine dependence: Secondary | ICD-10-CM | POA: Diagnosis not present

## 2017-10-16 DIAGNOSIS — Z7902 Long term (current) use of antithrombotics/antiplatelets: Secondary | ICD-10-CM | POA: Diagnosis not present

## 2017-10-16 DIAGNOSIS — I10 Essential (primary) hypertension: Secondary | ICD-10-CM | POA: Diagnosis not present

## 2017-10-16 DIAGNOSIS — L03211 Cellulitis of face: Secondary | ICD-10-CM | POA: Insufficient documentation

## 2017-10-16 DIAGNOSIS — L0291 Cutaneous abscess, unspecified: Secondary | ICD-10-CM | POA: Diagnosis present

## 2017-10-16 DIAGNOSIS — Z7982 Long term (current) use of aspirin: Secondary | ICD-10-CM | POA: Diagnosis not present

## 2017-10-16 DIAGNOSIS — Z79899 Other long term (current) drug therapy: Secondary | ICD-10-CM | POA: Diagnosis not present

## 2017-10-16 DIAGNOSIS — E119 Type 2 diabetes mellitus without complications: Secondary | ICD-10-CM | POA: Insufficient documentation

## 2017-10-16 MED ORDER — SULFAMETHOXAZOLE-TRIMETHOPRIM 800-160 MG PO TABS: 1.0000 | ORAL_TABLET | Freq: Two times a day (BID) | ORAL | 0 refills | Status: AC

## 2017-10-16 NOTE — ED Provider Notes (Signed)
[facility] Emergency Department Provider Note  ____________________________________________  Time seen: Approximately 8:30 PM  I have reviewed the triage vital signs and the nursing notes.   HISTORY  Chief Complaint Abscess    HPI Nicholas Reyes is a 62 y.o. male presents to the emergency department with a 2 cm region of circumferential cellulitis along the midline left cheek that has been present for the past 2 to 3 days.  Patient reports that he has been "picking at" the area.  Patient reports that over the last day, and has been more tender to the touch.  He denies fever chills.  Patient reports that affected region has been draining fluid.  No alleviating measures of been attempted.    Past Medical History:  Diagnosis Date  . Diabetes mellitus   . Hypertension   . Migraines     There are no active problems to display for this patient.   Past Surgical History:  Procedure Laterality Date  . CATARACT EXTRACTION Left 05/28/2017    Prior to Admission medications   Medication Sig Start Date End Date Taking? Authorizing Provider  acetaminophen-codeine (TYLENOL #3) 300-30 MG tablet Take 1 tablet by mouth every 6 (six) hours as needed for moderate pain. 06/14/17   Menshew, Dannielle Karvonen, PA-C  albuterol (PROVENTIL HFA;VENTOLIN HFA) 108 (90 BASE) MCG/ACT inhaler Inhale 2 puffs into the lungs every 4 (four) hours as needed. For shortness of breath    [provider]  aspirin 325 MG tablet Take 325 mg by mouth daily.    [provider]  atorvastatin (LIPITOR) 20 MG tablet Take 20 mg by mouth daily.    [provider]  benazepril (LOTENSIN) 20 MG tablet Take 20 mg by mouth daily.    [provider]  benzonatate (TESSALON PERLES) 100 MG capsule Take 1-2 tabs TID prn cough 06/14/17   Menshew, Dannielle Karvonen, PA-C  budesonide-formoterol (SYMBICORT) 80-4.5 MCG/ACT inhaler Inhale 2 puffs into the lungs 2 (two) times daily.     [provider]  citalopram (CELEXA) 20 MG tablet Take 20 mg by mouth daily.    [provider]  clopidogrel (PLAVIX) 75 MG tablet Take 75 mg by mouth daily.    [provider]  cyclobenzaprine (FLEXERIL) 5 MG tablet 1 tablet every 8 hours as needed for muscle spasms 09/06/17   Paulette Blanch, MD  ibuprofen (ADVIL,MOTRIN) 800 MG tablet Take 1 tablet (800 mg total) by mouth every 8 (eight) hours as needed for moderate pain. 09/06/17   Paulette Blanch, MD  isosorbide mononitrate (IMDUR) 30 MG 24 hr tablet Take 30 mg by mouth daily.    [provider]  metoprolol tartrate (LOPRESSOR) 25 MG tablet Take 25 mg by mouth 2 (two) times daily.    [provider]  naproxen (NAPROSYN) 500 MG tablet Take 1 tablet (500 mg total) by mouth 2 (two) times daily with a meal. 05/14/17   Lavonia Drafts, MD  nitroGLYCERIN (NITROSTAT) 0.4 MG SL tablet Place 0.4 mg under the tongue every 5 (five) minutes as needed. For chest pain    [provider]  ondansetron (ZOFRAN ODT) 4 MG disintegrating tablet Take 1 tablet (4 mg total) by mouth every 8 (eight) hours as needed. 06/14/17   Menshew, Dannielle Karvonen, PA-C  oxyCODONE-acetaminophen (PERCOCET) 7.5-325 MG tablet Take 1 tablet by mouth every 6 (six) hours as needed for severe pain. 06/02/17   Sable Feil, PA-C  predniSONE (STERAPRED UNI-PAK 21  TAB) 10 MG (21) TBPK tablet Take 6 tabs on day 1 Take 5 tabs on day 2 Take 4 tabs on day 3 Take 3 tabs on day 4 Take 2 tabs on day 5 Take 1 tab on day 6 09/18/17   Loney Hering, MD  sulfamethoxazole-trimethoprim (BACTRIM DS,SEPTRA DS) 800-160 MG tablet Take 1 tablet by mouth 2 (two) times daily for 7 days. 10/16/17 10/23/17  Lannie Fields, PA-C  traMADol (ULTRAM) 50 MG tablet Take 1 tablet (50 mg total) by mouth every 6 (six) hours as needed. 09/18/17   Loney Hering, MD  triamcinolone cream (KENALOG) 0.1 % Apply 1 application topically 2 (two) times daily.    [provider]    Allergies Patient has no known allergies.  No family history on file.  Social History Social History   Tobacco Use  . Smoking status: Former Research scientist (life sciences)  . Smokeless tobacco: Never Used  Substance Use Topics  . Alcohol use: No    Frequency: Never  . Drug use: No     Review of Systems  Constitutional: No fever/chills Eyes: No visual changes. No discharge ENT: No upper respiratory complaints. Cardiovascular: no chest pain. Respiratory: no cough. No SOB. Gastrointestinal: No abdominal pain.  No nausea, no vomiting.  No diarrhea.  No constipation. Musculoskeletal: Negative for musculoskeletal pain. Skin: Patient has facial cellulitis.  Neurological: Negative for headaches, focal weakness or numbness.   ____________________________________________   PHYSICAL EXAM:  VITAL SIGNS: ED Triage Vitals  Enc Vitals Group     BP 10/16/17 2007 (!) 145/71     Pulse Rate 10/16/17 2007 87     Resp 10/16/17 2007 16     Temp 10/16/17 2007 98.2 F (36.8 C)     Temp Source 10/16/17 2007 Oral     SpO2 10/16/17 2007 93 %     Weight 10/16/17 2007 240 lb (108.9 kg)     Height 10/16/17 2007 5\' 7"  (1.702 m)     Head Circumference --      Peak Flow --      Pain Score 10/16/17 2006 8     Pain Loc --      Pain Edu? --      Excl. in [place]? --      Constitutional: Alert and oriented. Well appearing and in no acute distress. Eyes: Conjunctivae are normal. PERRL. EOMI. Head: Atraumatic. Cardiovascular: Normal rate, regular rhythm. Normal S1 and S2.  Good peripheral circulation. Respiratory: Normal respiratory effort without tachypnea or retractions. Lungs CTAB. Good air entry to the bases with no decreased or absent breath sounds. Gastrointestinal: Bowel sounds 4 quadrants. Soft and nontender to palpation. No guarding or rigidity. No palpable masses. No distention. No CVA tenderness. Musculoskeletal: Full range of motion to all extremities. No gross deformities appreciated. Neurologic:   Normal speech and language. No gross focal neurologic deficits are appreciated.  Skin: Patient has a 2 cm region of circumferential left cheek cellulitis.  No induration or palpable fluctuance. Psychiatric: Mood and affect are normal. Speech and behavior are normal. Patient exhibits appropriate insight and judgement.   ____________________________________________   LABS (all labs ordered are listed, but only abnormal results are displayed)  Labs Reviewed - No data to display ____________________________________________  EKG   ____________________________________________    No results found.  ____________________________________________    PROCEDURES  Procedure(s) performed:    Procedures    Medications - No data to display   ____________________________________________   INITIAL IMPRESSION / ASSESSMENT AND  PLAN / ED COURSE  Pertinent labs & imaging results that were available during my care of the patient were reviewed by me and considered in my medical decision making (see chart for details).  Review of the [place] CSRS was performed in accordance of the [place] prior to dispensing any controlled drugs.      Assessment and plan Facial cellulitis Patient presents to the emergency department with 2 cm of circumferential cellulitis along the left cheek.  Patient was treated empirically with Bactrim given concern for MRSA.  Vital signs were reassuring prior to discharge.  All patient questions were answered.   ____________________________________________  FINAL CLINICAL IMPRESSION(S) / ED DIAGNOSES  Final diagnoses:  Facial cellulitis      NEW MEDICATIONS STARTED DURING THIS VISIT:  ED Discharge Orders        Ordered    sulfamethoxazole-trimethoprim (BACTRIM DS,SEPTRA DS) 800-160 MG tablet  2 times daily     10/16/17 2027          This chart was dictated using voice recognition software/Dragon. Despite best efforts to proofread, errors can occur which  can change the meaning. Any change was purely unintentional.    Karren Cobble 10/16/17 2033    Nance Pear, MD 10/16/17 2218

## 2017-10-16 NOTE — ED Triage Notes (Signed)
Pt reports that he has an abscess on the left side of his face and reports that it has been draining - the area is red/swollen

## 2017-11-15 ENCOUNTER — Ambulatory Visit (INDEPENDENT_AMBULATORY_CARE_PROVIDER_SITE_OTHER): Payer: Medicare HMO

## 2017-11-15 ENCOUNTER — Ambulatory Visit (INDEPENDENT_AMBULATORY_CARE_PROVIDER_SITE_OTHER): Payer: Medicare HMO | Admitting: Internal Medicine

## 2017-11-15 ENCOUNTER — Encounter: Payer: Self-pay | Admitting: Internal Medicine

## 2017-11-15 VITALS — BP 130/78 | HR 75 | Temp 97.7°F | Ht 67.0 in | Wt 231.0 lb

## 2017-11-15 DIAGNOSIS — J449 Chronic obstructive pulmonary disease, unspecified: Secondary | ICD-10-CM

## 2017-11-15 DIAGNOSIS — Z1159 Encounter for screening for other viral diseases: Secondary | ICD-10-CM | POA: Diagnosis not present

## 2017-11-15 DIAGNOSIS — M2550 Pain in unspecified joint: Secondary | ICD-10-CM | POA: Diagnosis not present

## 2017-11-15 DIAGNOSIS — M25561 Pain in right knee: Secondary | ICD-10-CM

## 2017-11-15 DIAGNOSIS — Z0184 Encounter for antibody response examination: Secondary | ICD-10-CM | POA: Diagnosis not present

## 2017-11-15 DIAGNOSIS — G8929 Other chronic pain: Secondary | ICD-10-CM | POA: Diagnosis not present

## 2017-11-15 DIAGNOSIS — M19042 Primary osteoarthritis, left hand: Secondary | ICD-10-CM | POA: Diagnosis not present

## 2017-11-15 DIAGNOSIS — Z125 Encounter for screening for malignant neoplasm of prostate: Secondary | ICD-10-CM

## 2017-11-15 DIAGNOSIS — Z1283 Encounter for screening for malignant neoplasm of skin: Secondary | ICD-10-CM | POA: Diagnosis not present

## 2017-11-15 DIAGNOSIS — L409 Psoriasis, unspecified: Secondary | ICD-10-CM | POA: Diagnosis not present

## 2017-11-15 DIAGNOSIS — M25562 Pain in left knee: Secondary | ICD-10-CM

## 2017-11-15 DIAGNOSIS — R739 Hyperglycemia, unspecified: Secondary | ICD-10-CM

## 2017-11-15 DIAGNOSIS — I10 Essential (primary) hypertension: Secondary | ICD-10-CM

## 2017-11-15 DIAGNOSIS — L299 Pruritus, unspecified: Secondary | ICD-10-CM

## 2017-11-15 DIAGNOSIS — J309 Allergic rhinitis, unspecified: Secondary | ICD-10-CM

## 2017-11-15 DIAGNOSIS — M79642 Pain in left hand: Secondary | ICD-10-CM | POA: Diagnosis not present

## 2017-11-15 DIAGNOSIS — Z1329 Encounter for screening for other suspected endocrine disorder: Secondary | ICD-10-CM | POA: Diagnosis not present

## 2017-11-15 DIAGNOSIS — M1711 Unilateral primary osteoarthritis, right knee: Secondary | ICD-10-CM | POA: Diagnosis not present

## 2017-11-15 DIAGNOSIS — E559 Vitamin D deficiency, unspecified: Secondary | ICD-10-CM | POA: Diagnosis not present

## 2017-11-15 DIAGNOSIS — Z13818 Encounter for screening for other digestive system disorders: Secondary | ICD-10-CM

## 2017-11-15 DIAGNOSIS — F32A Depression, unspecified: Secondary | ICD-10-CM

## 2017-11-15 DIAGNOSIS — F329 Major depressive disorder, single episode, unspecified: Secondary | ICD-10-CM

## 2017-11-15 DIAGNOSIS — Z113 Encounter for screening for infections with a predominantly sexual mode of transmission: Secondary | ICD-10-CM

## 2017-11-15 DIAGNOSIS — I2510 Atherosclerotic heart disease of native coronary artery without angina pectoris: Secondary | ICD-10-CM | POA: Diagnosis not present

## 2017-11-15 DIAGNOSIS — K219 Gastro-esophageal reflux disease without esophagitis: Secondary | ICD-10-CM

## 2017-11-15 DIAGNOSIS — E119 Type 2 diabetes mellitus without complications: Secondary | ICD-10-CM

## 2017-11-15 DIAGNOSIS — M79672 Pain in left foot: Secondary | ICD-10-CM

## 2017-11-15 DIAGNOSIS — M1712 Unilateral primary osteoarthritis, left knee: Secondary | ICD-10-CM | POA: Diagnosis not present

## 2017-11-15 DIAGNOSIS — M1990 Unspecified osteoarthritis, unspecified site: Secondary | ICD-10-CM

## 2017-11-15 DIAGNOSIS — L989 Disorder of the skin and subcutaneous tissue, unspecified: Secondary | ICD-10-CM

## 2017-11-15 MED ORDER — TRIAMCINOLONE ACETONIDE 0.1 % EX CREA: 1.0000 "application " | TOPICAL_CREAM | Freq: Two times a day (BID) | CUTANEOUS | 11 refills | Status: AC

## 2017-11-15 MED ORDER — METHYLPREDNISOLONE ACETATE 40 MG/ML IJ SUSP
40.0000 mg | Freq: Once | INTRAMUSCULAR | Status: AC
  Administered 2017-11-15: 40 mg via INTRAMUSCULAR

## 2017-11-15 MED ORDER — HYDROXYZINE HCL 25 MG PO TABS: 25.0000 mg | ORAL_TABLET | Freq: Three times a day (TID) | ORAL | 0 refills | Status: DC | PRN

## 2017-11-15 NOTE — Progress Notes (Addendum)
Chief Complaint  Patient presents with  . New Patient (Initial Visit)   New patient whom is illiterate former PCP Gibson Ramp in [place] at [place] he has moved from there to here again and used to live here  1. BP controlled called pharmacy [facility] in [place] [phone] to confirm all meds as pt did not know on norvasc 5 mg qd, lasix 40 mg qd, spironolactone 25 mg qd, Metoprolol 25 mg bid 2. H/o CAD with 3 stents per pt needs referral to cardiology w/o CP today  Will get records cardiology fax # [phone]  3. Psoriasis and skin sores he wants referral to dermatology on Cosentyx for psoriasis 300 mg q 4 weeks  4. C/o b/l knee pain and left hand pain prev had injections in knees which helped.   Review of Systems  Constitutional: Negative for weight loss.  HENT: Negative for hearing loss.   Eyes: Negative for blurred vision.  Respiratory: Negative for shortness of breath.   Cardiovascular: Negative for chest pain.  Gastrointestinal: Negative for abdominal pain.  Musculoskeletal: Positive for joint pain.  Skin: Positive for itching and rash.  Neurological: Negative for headaches.  Psychiatric/Behavioral: Negative for depression and memory loss.   Past Medical History:  Diagnosis Date  . Arthritis   . CAD (coronary artery disease)   . COPD (chronic obstructive pulmonary disease) ([place])   . Depression   . Diabetes mellitus   . Heart disease    cad with 3 stents   . Hypertension   . Migraines   . Psoriasis    joint pain in knees left hand    Past Surgical History:  Procedure Laterality Date  . APPENDECTOMY    . CATARACT EXTRACTION Left 05/28/2017  . CHOLECYSTECTOMY    . EYE SURGERY     cataract b/l   . hemorrhoid surgery      x 2   . right knee surgery     ? type of procedure   . STENT PLACEMENT VASCULAR ([place] HX)    . TONSILLECTOMY    . TONSILLECTOMY AND ADENOIDECTOMY     History reviewed. No pertinent family history. Social History    Socioeconomic History  . Marital status: Single    Spouse name: Not on file  . Number of children: 0  . Years of education: Not on file  . Highest education level: Not on file  Occupational History  . Not on file  Social Needs  . Financial resource strain: Not on file  . Food insecurity:    Worry: Not on file    Inability: Not on file  . Transportation needs:    Medical: Not on file    Non-medical: Not on file  Tobacco Use  . Smoking status: Former Research scientist (life sciences)  . Smokeless tobacco: Never Used  Substance and Sexual Activity  . Alcohol use: No    Frequency: Never  . Drug use: No  . Sexual activity: Not Currently  Lifestyle  . Physical activity:    Days per week: Not on file    Minutes per session: Not on file  . Stress: Not on file  Relationships  . Social connections:    Talks on phone: Not on file    Gets together: Not on file    Attends religious service: Not on file    Active member of club or organization: Not on file    Attends meetings of clubs or organizations: Not on file  Relationship status: Not on file  . Intimate partner violence:    Fear of current or ex partner: Not on file    Emotionally abused: Not on file    Physically abused: Not on file    Forced sexual activity: Not on file  Other Topics Concern  . Not on file  Social History Narrative   Former smoker quit 2013/2014 used to smoke<1 ppd x 2-3 years    No kids   Single not sexually active    Unable to read/write    Has dogs and cats at home    No guns   Wears seat belt    Current Meds  Medication Sig  . amLODipine (NORVASC) 5 MG tablet Take 5 mg by mouth daily.  . cetirizine (ZYRTEC) 10 MG tablet Take 10 mg by mouth daily.  . DULoxetine (CYMBALTA) 60 MG capsule Take 60 mg by mouth daily.  . furosemide (LASIX) 40 MG tablet Take 40 mg by mouth daily.  Marland Kitchen gabapentin (NEURONTIN) 300 MG capsule Take 300-600 mg by mouth 2 (two) times daily. 300 mg qd and 600 mg qhs  . isosorbide mononitrate  (IMDUR) 30 MG 24 hr tablet Take 30 mg by mouth daily. Per pharmacy taking 20 mg qd  . montelukast (SINGULAIR) 10 MG tablet Take 10 mg by mouth at bedtime.  . pantoprazole (PROTONIX) 40 MG tablet Take 40 mg by mouth daily.  . ranitidine (ZANTAC) 150 MG/10ML syrup 150 mg 2 (two) times daily. Taking 10 ml bid  . Secukinumab (COSENTYX 300 DOSE [place]) Inject into the skin every 30 (thirty) days.  Marland Kitchen spironolactone (ALDACTONE) 25 MG tablet Take 25 mg by mouth daily.   No Known Allergies Recent Results (from the past 2160 hour(s))  Urinalysis, Complete w Microscopic     Status: Abnormal   Collection Time: 09/05/17 10:50 PM  Result Value Ref Range   Color, Urine YELLOW (A) YELLOW   APPearance CLEAR (A) CLEAR   Specific Gravity, Urine 1.013 1.005 - 1.030   pH 7.0 5.0 - 8.0   Glucose, UA NEGATIVE NEGATIVE mg/dL   Hgb urine dipstick NEGATIVE NEGATIVE   Bilirubin Urine NEGATIVE NEGATIVE   Ketones, ur NEGATIVE NEGATIVE mg/dL   Protein, ur NEGATIVE NEGATIVE mg/dL   Nitrite NEGATIVE NEGATIVE   Leukocytes, UA NEGATIVE NEGATIVE   RBC / HPF 0-5 0 - 5 RBC/hpf   WBC, UA 0-5 0 - 5 WBC/hpf   Bacteria, UA RARE (A) NONE SEEN   Squamous Epithelial / LPF NONE SEEN 0 - 5    Comment: Please note change in reference range.   Mucus PRESENT     Comment: Performed at [facility], [address] [place], [place] [address]  CBC with Differential     Status: Abnormal   Collection Time: 09/06/17  1:35 AM  Result Value Ref Range   WBC 8.6 3.8 - 10.6 K/uL   RBC 4.79 4.40 - 5.90 MIL/uL   Hemoglobin 14.1 13.0 - 18.0 g/dL   HCT 43.4 40.0 - 52.0 %   MCV 90.6 80.0 - 100.0 fL   MCH 29.5 26.0 - 34.0 pg   MCHC 32.5 32.0 - 36.0 g/dL   RDW 18.2 (H) 11.5 - 14.5 %   Platelets 184 150 - 440 K/uL   Neutrophils Relative % 56 %   Neutro Abs 4.8 1.4 - 6.5 K/uL   Lymphocytes Relative 31 %   Lymphs Abs 2.6 1.0 - 3.6 K/uL   Monocytes Relative 9 %  Monocytes Absolute 0.7 0.2 - 1.0 K/uL   Eosinophils Relative 3 %    Eosinophils Absolute 0.3 0 - 0.7 K/uL   Basophils Relative 1 %   Basophils Absolute 0.1 0 - 0.1 K/uL    Comment: Performed at [facility], [address] [place], [place] [address]  Basic metabolic panel     Status: Abnormal   Collection Time: 09/06/17  1:35 AM  Result Value Ref Range   Sodium 139 135 - 145 mmol/L   Potassium 3.5 3.5 - 5.1 mmol/L   Chloride 99 (L) 101 - 111 mmol/L   CO2 33 (H) 22 - 32 mmol/L   Glucose, Bld 122 (H) 65 - 99 mg/dL   BUN 11 6 - 20 mg/dL   Creatinine, Ser 0.86 0.61 - 1.24 mg/dL   Calcium 9.2 8.9 - 10.3 mg/dL   GFR calc non Af Amer >60 >60 mL/min   GFR calc Af Amer >60 >60 mL/min    Comment: (NOTE) The eGFR has been calculated using the CKD EPI equation. This calculation has not been validated in all clinical situations. eGFR's persistently <60 mL/min signify possible Chronic Kidney Disease.    Anion gap 7 5 - 15    Comment: Performed at [facility], [address] [place], [place] [address]  CBC     Status: Abnormal   Collection Time: 09/24/17 11:07 PM  Result Value Ref Range   WBC 10.5 3.8 - 10.6 K/uL   RBC 4.45 4.40 - 5.90 MIL/uL   Hemoglobin 13.6 13.0 - 18.0 g/dL   HCT 40.6 40.0 - 52.0 %   MCV 91.2 80.0 - 100.0 fL   MCH 30.5 26.0 - 34.0 pg   MCHC 33.4 32.0 - 36.0 g/dL   RDW 17.5 (H) 11.5 - 14.5 %   Platelets 186 150 - 440 K/uL    Comment: Performed at [facility], [address] [place], [place] [address]  Comprehensive metabolic panel     Status: Abnormal   Collection Time: 09/24/17 11:07 PM  Result Value Ref Range   Sodium 141 135 - 145 mmol/L   Potassium 3.3 (L) 3.5 - 5.1 mmol/L   Chloride 104 101 - 111 mmol/L   CO2 30 22 - 32 mmol/L   Glucose, Bld 118 (H) 65 - 99 mg/dL   BUN 12 6 - 20 mg/dL   Creatinine, Ser 0.85 0.61 - 1.24 mg/dL   Calcium 8.7 (L) 8.9 - 10.3 mg/dL   Total Protein 7.3 6.5 - 8.1 g/dL   Albumin 3.8 3.5 - 5.0 g/dL   AST 17 15 - 41 U/L   ALT 16 (L) 17 - 63 U/L   Alkaline  Phosphatase 73 38 - 126 U/L   Total Bilirubin 0.5 0.3 - 1.2 mg/dL   GFR calc non Af Amer >60 >60 mL/min   GFR calc Af Amer >60 >60 mL/min    Comment: (NOTE) The eGFR has been calculated using the CKD EPI equation. This calculation has not been validated in all clinical situations. eGFR's persistently <60 mL/min signify possible Chronic Kidney Disease.    Anion gap 7 5 - 15    Comment: Performed at [facility], [address] [place], [place] [address]  Urinalysis, Complete w Microscopic     Status: Abnormal   Collection Time: 09/24/17 11:07 PM  Result Value Ref Range   Color, Urine YELLOW (A) YELLOW   APPearance CLEAR (A) CLEAR   Specific Gravity, Urine 1.023 1.005 - 1.030  pH 6.0 5.0 - 8.0   Glucose, UA NEGATIVE NEGATIVE mg/dL   Hgb urine dipstick NEGATIVE NEGATIVE   Bilirubin Urine NEGATIVE NEGATIVE   Ketones, ur 5 (A) NEGATIVE mg/dL   Protein, ur NEGATIVE NEGATIVE mg/dL   Nitrite NEGATIVE NEGATIVE   Leukocytes, UA NEGATIVE NEGATIVE   RBC / HPF 0-5 0 - 5 RBC/hpf   WBC, UA 0-5 0 - 5 WBC/hpf   Bacteria, UA NONE SEEN NONE SEEN   Squamous Epithelial / LPF 0-5 0 - 5   Mucus PRESENT    Ca Oxalate Crys, UA PRESENT     Comment: Performed at [facility], [address] [place], [place] [address]  Comprehensive metabolic panel     Status: None   Collection Time: 11/15/17  4:47 PM  Result Value Ref Range   Sodium 139 135 - 145 mEq/L   Potassium 3.7 3.5 - 5.1 mEq/L   Chloride 98 96 - 112 mEq/L   CO2 32 19 - 32 mEq/L   Glucose, Bld 80 70 - 99 mg/dL   BUN 14 6 - 23 mg/dL   Creatinine, Ser 0.95 0.40 - 1.50 mg/dL   Total Bilirubin 0.4 0.2 - 1.2 mg/dL   Alkaline Phosphatase 78 39 - 117 U/L   AST 13 0 - 37 U/L   ALT 13 0 - 53 U/L   Total Protein 7.2 6.0 - 8.3 g/dL   Albumin 4.2 3.5 - 5.2 g/dL   Calcium 9.1 8.4 - 10.5 mg/dL   GFR 85.68 >60.00 mL/min  CBC with Differential/Platelet     Status: None   Collection Time: 11/15/17  4:47 PM  Result Value Ref  Range   WBC 7.5 4.0 - 10.5 K/uL   RBC 4.29 4.22 - 5.81 Mil/uL   Hemoglobin 13.4 13.0 - 17.0 g/dL   HCT 40.2 39.0 - 52.0 %   MCV 93.6 78.0 - 100.0 fl   MCHC 33.4 30.0 - 36.0 g/dL   RDW 15.4 11.5 - 15.5 %   Platelets 173.0 150.0 - 400.0 K/uL   Neutrophils Relative % 57.7 43.0 - 77.0 %   Lymphocytes Relative 31.3 12.0 - 46.0 %   Monocytes Relative 7.8 3.0 - 12.0 %   Eosinophils Relative 2.0 0.0 - 5.0 %   Basophils Relative 1.2 0.0 - 3.0 %   Neutro Abs 4.3 1.4 - 7.7 K/uL   Lymphs Abs 2.4 0.7 - 4.0 K/uL   Monocytes Absolute 0.6 0.1 - 1.0 K/uL   Eosinophils Absolute 0.2 0.0 - 0.7 K/uL   Basophils Absolute 0.1 0.0 - 0.1 K/uL  Urinalysis, Routine w reflex microscopic     Status: None   Collection Time: 11/15/17  4:47 PM  Result Value Ref Range   Specific Gravity, UA CANCELED     Comment: Quantity was not sufficient for analysis.  Result canceled by the ancillary.    pH, UA CANCELED     Comment: Test not performed  Result canceled by the ancillary.    Protein, UA CANCELED     Comment: Test not performed  Result canceled by the ancillary.    Glucose, UA CANCELED     Comment: Test not performed  Result canceled by the ancillary.    Ketones, UA CANCELED     Comment: Test not performed  Result canceled by the ancillary.   TSH     Status: None   Collection Time: 11/15/17  4:47 PM  Result Value Ref Range   TSH 0.87 0.35 - 4.50 uIU/mL  Hemoglobin A1c     Status: Abnormal   Collection Time: 11/15/17  4:47 PM  Result Value Ref Range   Hgb A1c MFr Bld 6.6 (H) 4.6 - 6.5 %    Comment: Glycemic Control Guidelines for People with Diabetes:Non Diabetic:  <6%Goal of Therapy: <7%Additional Action Suggested:  >8%   PSA, Medicare     Status: None   Collection Time: 11/15/17  4:47 PM  Result Value Ref Range   PSA 0.26 0.10 - 4.00 ng/ml    Comment: Test performed using Access Hybritech PSA Assay, a parmagnetic partical, chemiluminecent immunoassay.  Rheumatoid Factor     Status: None    Collection Time: 11/15/17  4:47 PM  Result Value Ref Range   Rhuematoid fact SerPl-aCnc <14 <14 IU/mL  Sedimentation rate     Status: None   Collection Time: 11/15/17  4:47 PM  Result Value Ref Range   Sed Rate 12 0 - 20 mm/hr  C-reactive protein     Status: None   Collection Time: 11/15/17  4:47 PM  Result Value Ref Range   CRP 1.1 0.5 - 20.0 mg/dL  Antinuclear Antib (ANA)     Status: None   Collection Time: 11/15/17  4:47 PM  Result Value Ref Range   Anti Nuclear Antibody(ANA) NEGATIVE NEGATIVE    Comment: ANA IFA is a first line screen for detecting the presence of up to approximately 150 autoantibodies in various autoimmune diseases. A negative ANA IFA result suggests ANA-associated autoimmune diseases are not present at this time. . Visit Physician FAQs for interpretation of all antibodies in the [place], prevalence, and association with diseases at http://education.[facility].com/ RAX/ENM076 .   Cyclic citrul peptide antibody, IgG     Status: None   Collection Time: 11/15/17  4:47 PM  Result Value Ref Range   Cyclic Citrullin Peptide Ab <16 UNITS    Comment: Reference Range Negative:            <20 Weak Positive:       20-39 Moderate Positive:   40-59 Strong Positive:     >59 .   Hepatitis C antibody     Status: None   Collection Time: 11/15/17  4:47 PM  Result Value Ref Range   Hepatitis C Ab NON-REACTIVE NON-REACTI   SIGNAL TO CUT-OFF 0.02 <1.00    Comment: . HCV antibody was non-reactive. There is no laboratory  evidence of HCV infection. . In most cases, no further action is required. However, if recent HCV exposure is suspected, a test for HCV RNA (test code [phone]) is suggested. . For additional information please refer to http://education.[facility].com/faq/FAQ22v1 (This link is being provided for informational/ educational purposes only.) .   Measles/Mumps/Rubella Immunity     Status: None   Collection Time: 11/15/17  4:47 PM   Result Value Ref Range   [place] IgG 208.00 AU/mL    Comment: AU/mL            Interpretation -----            -------------- <25.00           Negative 25.00-29.99      Equivocal >29.99           Positive . A positive result indicates that the patient has antibody to measles virus. It does not differentiate  between an active or past infection. The clinical  diagnosis must be interpreted in conjunction with  clinical signs and symptoms of the patient.    Mumps IgG >300.00 AU/mL  Comment:  AU/mL           Interpretation -------         ---------------- <9.00             Negative 9.00-10.99        Equivocal >10.99            Positive A positive result indicates that the patient has  antibody to mumps virus. It does not differentiate between an  active or past infection. The clinical diagnosis must be interpreted in conjunction with clinical signs and symptoms of the patient. .    Rubella 14.80 index    Comment:     Index            Interpretation     -----            --------------       <0.90            Not consistent with Immunity     0.90-0.99        Equivocal     > or = 1.00      Consistent with Immunity  . The presence of rubella IgG antibody suggests  immunization or past or current infection with rubella virus.   Hepatitis B surface antigen     Status: None   Collection Time: 11/15/17  4:47 PM  Result Value Ref Range   Hepatitis B Surface Ag NON-REACTIVE NON-REACTI  Hepatitis B surface antibody     Status: Abnormal   Collection Time: 11/15/17  4:47 PM  Result Value Ref Range   Hepatitis B-Post <5 (L) > OR = 10 mIU/mL    Comment: Verified by repeat analysis. . . Patient does not have immunity to hepatitis B virus. . For additional information, please refer to http://education.[facility].com/faq/FAQ105 (This link is being provided for informational/ educational purposes only).   TEST AUTHORIZATION     Status: None   Collection Time: 11/15/17  4:47  PM  Result Value Ref Range   TEST NAME: HEPATITIS B SURFACE MMR (IGG)    TEST CODE: [id number]    CLIENT CONTACT: LATOYA WRIGHT    REPORT ALWAYS MESSAGE SIGNATURE      Comment: . The laboratory testing on this patient was verbally requested or confirmed by the ordering physician or his or her authorized representative after contact with an employee of [employer]. Federal regulations require that we maintain on file written authorization for all laboratory testing.  Accordingly we are asking that the ordering physician or his or her authorized representative sign a copy of this report and promptly return it to the client service representative. . . Signature:____________________________________________________ . Please fax this signed page to [phone] or return it via your [employer] courier.   Vitamin D (25 hydroxy)     Status: Abnormal   Collection Time: 11/16/17  8:22 AM  Result Value Ref Range   VITD 11.36 (L) 30.00 - 100.00 ng/mL   Objective  Body mass index is 36.18 kg/m. Wt Readings from Last 3 Encounters:  11/18/17 231 lb (104.8 kg)  11/15/17 231 lb (104.8 kg)  10/16/17 240 lb (108.9 kg)   Temp Readings from Last 3 Encounters:  11/18/17 98.8 F (37.1 C) (Oral)  11/15/17 97.7 F (36.5 C) (Oral)  10/16/17 98.2 F (36.8 C) (Oral)   BP Readings from Last 3 Encounters:  11/18/17 136/74  11/15/17 130/78  10/16/17 (!) 145/71   Pulse Readings from Last 3 Encounters:  11/18/17 67  11/15/17 75  10/16/17 87    Physical Exam  Constitutional: He is oriented to person, place, and time. Vital signs are normal. He appears well-developed and well-nourished. He is cooperative.  HENT:  Head: Normocephalic and atraumatic.  Mouth/Throat: Oropharynx is clear and moist and mucous membranes are normal.  Eyes: Pupils are equal, round, and reactive to light. Conjunctivae are normal.  Cardiovascular: Normal rate, regular rhythm and normal heart  sounds.  Pulmonary/Chest: Effort normal and breath sounds normal.  Abdominal: There is no tenderness.  Distended with sores on abdomen   Neurological: He is alert and oriented to person, place, and time. Gait normal.  Skin: Skin is warm and dry. Lesion noted.  Sores to left cheek, trunk, extremities    Psychiatric: He has a normal mood and affect. His speech is normal and behavior is normal. Judgment and thought content normal. Cognition and memory are normal.  Nursing note and vitals reviewed.   Assessment   1. HTN/HLD  2. DM 2 history A1C 6.6  3. Sores to skin and psoriasis ? Psoriatic arthritis with joint pain in left hand and knees b/l  4. CAD s/p 3 stents  -reviewed cardiology notes [facility] Dr. Doran Clay  Last seen 06/11/17 -h/o LAD stenting, LCX-OM 02/2016 -echo 02/2016 EF 55%, grade 2 DD -note mentions h/o OSA 5. H/o COPD by history former smoker  6. HM Plan   1. Cont meds  Reviewed list with pharmacy  Check labs  Will need to check lipid in future when fasting  2. Check labs A1C Will need eye exam and foot exam in future if DM + 3. Refer to dermatology Dr. Kellie Moor  Currently on Cosentyx  Trial of TMC not face, axilla or groin Prn Atarax 25 tid  Check RA labs Xray b/l knees and L hand Given Depo medrol 40 inj x 1 today to see if will help with pain   4. Refer to cardiology Dr. Rockey Situ Get records prev cardiologist fax # [phone]  5. Congratulated on quitting cigarettes in 2013/2014  6.  Had flu shot per pt  Tdap had 06/20/15 pna 23 had 09/19/12  Check with PCP other vaccines I.e shingrix and colonoscopy, labs,notes I.e cardiology notes if had   Check labs CMET, CBC, TSH, A1C, RA labs, PSA, hep C (negative), try to add on hep B labs (will rec vaccine), MMR, and vitamin D (low) Will need to do lipid and urine protein in the future  Former smoker quit 2013/2014 smoked <1 ppd x 2-3 years  Urine had 09/24/17 normal    Reviewed MRI 09/13/11 consider repeat MRI brain  if clinically indicated in future   Of note reviewed [place] visit 11/13/17 c/o dizziness sitting BP 124/66 HR 77 and O2 91 room air and standing BP 100/56 HR 82 O2 88%. BP on 10/09/17 at this clinic was 146/60.   Provider: Dr. Olivia Mackie McLean-[facility]-Internal Medicine

## 2017-11-15 NOTE — Patient Instructions (Addendum)
F/u in 3 weeks  We referred to Dr. Kellie Moor dermatology (skin) [place] [phone]. Rockey Situ cardiology (heart) [facility] [phone] Use cream all over body not face/private area  Arthritis Arthritis means joint pain. It can also mean joint disease. A joint is a place where bones come together. People who have arthritis may have:  Red joints.  Swollen joints.  Stiff joints.  Warm joints.  A fever.  A feeling of being sick.  Follow these instructions at home: Pay attention to any changes in your symptoms. Take these actions to help with your pain and swelling. Medicines  Take over-the-counter and prescription medicines only as told by your doctor.  Do not take aspirin for pain if your doctor says that you may have gout. Activity  Rest your joint if your doctor tells you to.  Avoid activities that make the pain worse.  Exercise your joint regularly as told by your doctor. Try doing exercises like: ? Swimming. ? Water aerobics. ? Biking. ? Walking. Joint Care   If your joint is swollen, keep it raised (elevated) if told by your doctor.  If your joint feels stiff in the morning, try taking a warm shower.  If you have diabetes, do not apply heat without asking your doctor.  If told, apply heat to the joint: ? Put a towel between the joint and the hot pack or heating pad. ? Leave the heat on the area for 20-30 minutes.  If told, apply ice to the joint: ? Put ice in a plastic bag. ? Place a towel between your skin and the bag. ? Leave the ice on for 20 minutes, 2-3 times per day.  Keep all follow-up visits as told by your doctor. Contact a doctor if:  The pain gets worse.  You have a fever. Get help right away if:  You have very bad pain in your joint.  You have swelling in your joint.  Your joint is red.  Many joints become painful and swollen.  You have very bad back pain.  Your leg is very weak.  You cannot control  your pee (urine) or poop (stool). This information is not intended to replace advice given to you by your health care provider. Make sure you discuss any questions you have with your health care provider. Document Released: 07/19/2009 Document Revised: 09/30/2015 Document Reviewed: 07/20/2014 Elsevier Interactive Patient Education  [employer].

## 2017-11-15 NOTE — Progress Notes (Signed)
Pre visit review using our clinic review tool, if applicable. No additional management support is needed unless otherwise documented below in the visit note. 

## 2017-11-16 LAB — TSH: TSH: 0.87 u[IU]/mL (ref 0.35–4.50)

## 2017-11-16 LAB — CBC WITH DIFFERENTIAL/PLATELET
BASOS PCT: 1.2 % (ref 0.0–3.0)
Basophils Absolute: 0.1 10*3/uL (ref 0.0–0.1)
EOS ABS: 0.2 10*3/uL (ref 0.0–0.7)
EOS PCT: 2 % (ref 0.0–5.0)
HCT: 40.2 % (ref 39.0–52.0)
HEMOGLOBIN: 13.4 g/dL (ref 13.0–17.0)
LYMPHS ABS: 2.4 10*3/uL (ref 0.7–4.0)
Lymphocytes Relative: 31.3 % (ref 12.0–46.0)
MCHC: 33.4 g/dL (ref 30.0–36.0)
MCV: 93.6 fl (ref 78.0–100.0)
MONO ABS: 0.6 10*3/uL (ref 0.1–1.0)
Monocytes Relative: 7.8 % (ref 3.0–12.0)
NEUTROS ABS: 4.3 10*3/uL (ref 1.4–7.7)
Neutrophils Relative %: 57.7 % (ref 43.0–77.0)
PLATELETS: 173 10*3/uL (ref 150.0–400.0)
RBC: 4.29 Mil/uL (ref 4.22–5.81)
RDW: 15.4 % (ref 11.5–15.5)
WBC: 7.5 10*3/uL (ref 4.0–10.5)

## 2017-11-16 LAB — COMPREHENSIVE METABOLIC PANEL
ALT: 13 U/L (ref 0–53)
AST: 13 U/L (ref 0–37)
Albumin: 4.2 g/dL (ref 3.5–5.2)
Alkaline Phosphatase: 78 U/L (ref 39–117)
BUN: 14 mg/dL (ref 6–23)
CHLORIDE: 98 meq/L (ref 96–112)
CO2: 32 meq/L (ref 19–32)
CREATININE: 0.95 mg/dL (ref 0.40–1.50)
Calcium: 9.1 mg/dL (ref 8.4–10.5)
GFR: 85.68 mL/min (ref >60.00)
GLUCOSE: 80 mg/dL (ref 70–99)
POTASSIUM: 3.7 meq/L (ref 3.5–5.1)
Sodium: 139 mEq/L (ref 135–145)
Total Bilirubin: 0.4 mg/dL (ref 0.2–1.2)
Total Protein: 7.2 g/dL (ref 6.0–8.3)

## 2017-11-16 LAB — SEDIMENTATION RATE: SED RATE: 12 mm/h (ref 0–20)

## 2017-11-16 LAB — C-REACTIVE PROTEIN: CRP: 1.1 mg/dL (ref 0.5–20.0)

## 2017-11-16 LAB — HEMOGLOBIN A1C: Hgb A1c MFr Bld: 6.6 % — ABNORMAL HIGH (ref 4.6–6.5)

## 2017-11-16 LAB — VITAMIN D 25 HYDROXY (VIT D DEFICIENCY, FRACTURES): VITD: 11.36 ng/mL — AB (ref 30.00–100.00)

## 2017-11-16 LAB — PSA, MEDICARE: PSA: 0.26 ng/ml (ref 0.10–4.00)

## 2017-11-17 LAB — URINALYSIS, ROUTINE W REFLEX MICROSCOPIC

## 2017-11-18 ENCOUNTER — Emergency Department
Admission: EM | Admit: 2017-11-18 | Discharge: 2017-11-18 | Disposition: A | Discharge status: Home / Self Care | Payer: Medicare HMO | Attending: Emergency Medicine | Admitting: Emergency Medicine

## 2017-11-18 ENCOUNTER — Encounter: Payer: Self-pay | Admitting: Emergency Medicine

## 2017-11-18 ENCOUNTER — Other Ambulatory Visit: Payer: Self-pay

## 2017-11-18 DIAGNOSIS — L03211 Cellulitis of face: Secondary | ICD-10-CM

## 2017-11-18 DIAGNOSIS — S40861A Insect bite (nonvenomous) of right upper arm, initial encounter: Secondary | ICD-10-CM | POA: Insufficient documentation

## 2017-11-18 DIAGNOSIS — E119 Type 2 diabetes mellitus without complications: Secondary | ICD-10-CM | POA: Diagnosis not present

## 2017-11-18 DIAGNOSIS — W57XXXA Bitten or stung by nonvenomous insect and other nonvenomous arthropods, initial encounter: Secondary | ICD-10-CM | POA: Insufficient documentation

## 2017-11-18 DIAGNOSIS — L539 Erythematous condition, unspecified: Secondary | ICD-10-CM | POA: Diagnosis present

## 2017-11-18 DIAGNOSIS — I10 Essential (primary) hypertension: Secondary | ICD-10-CM | POA: Insufficient documentation

## 2017-11-18 DIAGNOSIS — Y939 Activity, unspecified: Secondary | ICD-10-CM | POA: Insufficient documentation

## 2017-11-18 DIAGNOSIS — Z87891 Personal history of nicotine dependence: Secondary | ICD-10-CM | POA: Insufficient documentation

## 2017-11-18 DIAGNOSIS — Z7902 Long term (current) use of antithrombotics/antiplatelets: Secondary | ICD-10-CM | POA: Diagnosis not present

## 2017-11-18 DIAGNOSIS — Z79899 Other long term (current) drug therapy: Secondary | ICD-10-CM | POA: Diagnosis not present

## 2017-11-18 DIAGNOSIS — Y999 Unspecified external cause status: Secondary | ICD-10-CM | POA: Diagnosis not present

## 2017-11-18 DIAGNOSIS — Y929 Unspecified place or not applicable: Secondary | ICD-10-CM | POA: Insufficient documentation

## 2017-11-18 DIAGNOSIS — Z7982 Long term (current) use of aspirin: Secondary | ICD-10-CM | POA: Insufficient documentation

## 2017-11-18 DIAGNOSIS — S0086XA Insect bite (nonvenomous) of other part of head, initial encounter: Secondary | ICD-10-CM | POA: Diagnosis not present

## 2017-11-18 MED ORDER — METHYLPREDNISOLONE 4 MG PO TBPK: ORAL_TABLET | ORAL | 0 refills | Status: DC

## 2017-11-18 MED ORDER — MUPIROCIN 2 % EX OINT: TOPICAL_OINTMENT | CUTANEOUS | 0 refills | Status: DC

## 2017-11-18 MED ORDER — DEXAMETHASONE SODIUM PHOSPHATE 10 MG/ML IJ SOLN
10.0000 mg | Freq: Once | INTRAMUSCULAR | Status: AC
  Administered 2017-11-18: 10 mg via INTRAMUSCULAR
  Filled 2017-11-18: qty 1

## 2017-11-18 MED ORDER — HYDROXYZINE HCL 50 MG PO TABS
50.0000 mg | ORAL_TABLET | Freq: Once | ORAL | Status: AC
Start: 2017-11-18 — End: 2017-11-18
  Administered 2017-11-18: 50 mg via ORAL
  Filled 2017-11-18: qty 1

## 2017-11-18 MED ORDER — HYDROXYZINE HCL 50 MG PO TABS: 50.0000 mg | ORAL_TABLET | Freq: Three times a day (TID) | ORAL | 0 refills | Status: DC | PRN

## 2017-11-18 NOTE — ED Triage Notes (Signed)
Pt has noticeable insect bites to the under side of right arm. Pt reports that bites are only located in that arm at this time. Pt is ambulatory to triage with NAD.

## 2017-11-18 NOTE — ED Provider Notes (Signed)
[facility] Emergency Department Provider Note   ____________________________________________   None    (approximate)  I have reviewed the triage vital signs and the nursing notes.   HISTORY  Chief Complaint Rash    HPI Nicholas Reyes is a 62 y.o. male patient complained of bug bites on the right arm for 2 days.  Patient state intense itching associated bug bites.  Patient also concerned secondary to  erythematous lesion left cheek.  Patient was seen approximate 1 month ago diagnosed with facial cellulitis and given Bactrim DS.  Patient stated no resolution for this complaint.  Patient rates his pain discomfort as a 9/10.  Patient states itchy pain to the right arm and achy pain left cheek.  Past Medical History:  Diagnosis Date  . Diabetes mellitus   . Hypertension   . Migraines     There are no active problems to display for this patient.   Past Surgical History:  Procedure Laterality Date  . addenoids    . APPENDECTOMY    . CATARACT EXTRACTION Left 05/28/2017  . CHOLECYSTECTOMY    . STENT PLACEMENT VASCULAR ([place] HX)    . TONSILLECTOMY      Prior to Admission medications   Medication Sig Start Date End Date Taking? Authorizing Provider  acetaminophen-codeine (TYLENOL #3) 300-30 MG tablet Take 1 tablet by mouth every 6 (six) hours as needed for moderate pain. 06/14/17   Menshew, Dannielle Karvonen, PA-C  albuterol (PROVENTIL HFA;VENTOLIN HFA) 108 (90 BASE) MCG/ACT inhaler Inhale 2 puffs into the lungs every 4 (four) hours as needed. For shortness of breath    [provider]  aspirin 325 MG tablet Take 325 mg by mouth daily.    [provider]  atorvastatin (LIPITOR) 20 MG tablet Take 20 mg by mouth daily.    [provider]  benazepril (LOTENSIN) 20 MG tablet Take 20 mg by mouth daily.    [provider]  budesonide-formoterol (SYMBICORT) 80-4.5 MCG/ACT inhaler Inhale 2 puffs into the lungs 2 (two) times daily.     [provider]  citalopram (CELEXA) 20 MG tablet Take 20 mg by mouth daily.    [provider]  clopidogrel (PLAVIX) 75 MG tablet Take 75 mg by mouth daily.    [provider]  cyclobenzaprine (FLEXERIL) 5 MG tablet 1 tablet every 8 hours as needed for muscle spasms 09/06/17   Paulette Blanch, MD  hydrOXYzine (ATARAX/VISTARIL) 25 MG tablet Take 1 tablet (25 mg total) by mouth 3 (three) times daily as needed. May make you sleepy 11/15/17   McLean-Scocuzza, Nino Glow, MD  hydrOXYzine (ATARAX/VISTARIL) 50 MG tablet Take 1 tablet (50 mg total) by mouth 3 (three) times daily as needed for itching. 11/18/17   Sable Feil, PA-C  ibuprofen (ADVIL,MOTRIN) 800 MG tablet Take 1 tablet (800 mg total) by mouth every 8 (eight) hours as needed for moderate pain. 09/06/17   Paulette Blanch, MD  isosorbide mononitrate (IMDUR) 30 MG 24 hr tablet Take 30 mg by mouth daily.    [provider]  methylPREDNISolone (MEDROL DOSEPAK) 4 MG TBPK tablet Take Tapered dose as directed 11/18/17   Sable Feil, PA-C  metoprolol tartrate (LOPRESSOR) 25 MG tablet Take 25 mg by mouth 2 (two) times daily.    [provider]  mupirocin ointment (BACTROBAN) 2 % Apply to affected area 3 times daily 11/18/17 11/18/18  Sable Feil, PA-C  naproxen (NAPROSYN) 500 MG tablet Take 1  tablet (500 mg total) by mouth 2 (two) times daily with a meal. 05/14/17   Lavonia Drafts, MD  nitroGLYCERIN (NITROSTAT) 0.4 MG SL tablet Place 0.4 mg under the tongue every 5 (five) minutes as needed. For chest pain    [provider]  ondansetron (ZOFRAN ODT) 4 MG disintegrating tablet Take 1 tablet (4 mg total) by mouth every 8 (eight) hours as needed. 06/14/17   Menshew, Dannielle Karvonen, PA-C  oxyCODONE-acetaminophen (PERCOCET) 7.5-325 MG tablet Take 1 tablet by mouth every 6 (six) hours as needed for severe pain. 06/02/17   Sable Feil, PA-C  predniSONE (STERAPRED UNI-PAK 21 TAB) 10 MG (21) TBPK tablet Take 6 tabs  on day 1 Take 5 tabs on day 2 Take 4 tabs on day 3 Take 3 tabs on day 4 Take 2 tabs on day 5 Take 1 tab on day 6 09/18/17   Loney Hering, MD  traMADol (ULTRAM) 50 MG tablet Take 1 tablet (50 mg total) by mouth every 6 (six) hours as needed. 09/18/17   Loney Hering, MD  triamcinolone cream (KENALOG) 0.1 % Apply 1 application topically 2 (two) times daily.    [provider]  triamcinolone cream (KENALOG) 0.1 % Apply 1 application topically 2 (two) times daily. Not face, groin, underarms 11/15/17   McLean-Scocuzza, Nino Glow, MD    Allergies Patient has no known allergies.  No family history on file.  Social History Social History   Tobacco Use  . Smoking status: Former Research scientist (life sciences)  . Smokeless tobacco: Never Used  Substance Use Topics  . Alcohol use: No    Frequency: Never  . Drug use: No    Review of Systems Constitutional: No fever/chills Eyes: No visual changes. ENT: No sore throat. Cardiovascular: Denies chest pain. Respiratory: Denies shortness of breath. Gastrointestinal: No abdominal pain.  No nausea, no vomiting.  No diarrhea.  No constipation. Genitourinary: Negative for dysuria. Musculoskeletal: Negative for back pain. Skin: Negative for rash. Neurological: Negative for headaches, focal weakness or numbness. Endocrine:Diabetes and hypertension ____________________________________________   PHYSICAL EXAM:  VITAL SIGNS: ED Triage Vitals  Enc Vitals Group     BP 11/18/17 2215 120/66     Pulse Rate 11/18/17 2215 80     Resp 11/18/17 2215 18     Temp 11/18/17 2215 98.8 F (37.1 C)     Temp Source 11/18/17 2215 Oral     SpO2 11/18/17 2215 94 %     Weight 11/18/17 2213 231 lb (104.8 kg)     Height 11/18/17 2213 5\' 7"  (1.702 m)     Head Circumference --      Peak Flow --      Pain Score 11/18/17 2212 9     Pain Loc --      Pain Edu? --      Excl. in [place]? --    Constitutional: Alert and oriented. Well appearing and in no acute  distress. Cardiovascular: Normal rate, regular rhythm. Grossly normal heart sounds.  Good peripheral circulation. Respiratory: Normal respiratory effort.  No retractions. Lungs CTAB. Musculoskeletal: No lower extremity tenderness nor edema.  No joint effusions. Neurologic:  Normal speech and language. No gross focal neurologic deficits are appreciated. No gait instability. Skin: 2 cm erythematous lesion of the left cheek.  Area is nonfluctuant.  Moderate guarding palpation.  Patient has multiple insect bite to the right upper arm.  Psychiatric: Mood and affect are normal. Speech and behavior are normal.  ____________________________________________   LABS (  all labs ordered are listed, but only abnormal results are displayed)  Labs Reviewed - No data to display ____________________________________________  EKG   ____________________________________________  RADIOLOGY  ED MD interpretation:    Official radiology report(s): No results found.  ____________________________________________   PROCEDURES  Procedure(s) performed: None  Procedures  Critical Care performed: No  ____________________________________________   INITIAL IMPRESSION / ASSESSMENT AND PLAN / ED COURSE  As part of my medical decision making, I reviewed the following data within the electronic MEDICAL RECORD NUMBER    Facial cellulitis and multiple insect bites to the right upper arm.  Patient given discharge care instructions.  Patient was given IM injection of Decadron and p.o. Atarax prior to departure.  Patient given a prescription for Bactroban for facial cellulitis.  Patient also given a prescription for the Medrol Dosepak and Atarax.  Patient advised follow-up PCP for continued care.      ____________________________________________   FINAL CLINICAL IMPRESSION(S) / ED DIAGNOSES  Final diagnoses:  Facial cellulitis  Bug bite, initial encounter     ED Discharge Orders        Ordered     mupirocin ointment (BACTROBAN) 2 %     11/18/17 2325    methylPREDNISolone (MEDROL DOSEPAK) 4 MG TBPK tablet     11/18/17 2325    hydrOXYzine (ATARAX/VISTARIL) 50 MG tablet  3 times daily PRN     11/18/17 2325       Note:  This document was prepared using Dragon voice recognition software and may include unintentional dictation errors.    Sable Feil, PA-C 11/18/17 [id number]    Hinda Kehr, MD 11/18/17 [phone]

## 2017-11-19 ENCOUNTER — Telehealth: Payer: Self-pay | Admitting: Internal Medicine

## 2017-11-19 ENCOUNTER — Other Ambulatory Visit: Payer: Self-pay | Admitting: Internal Medicine

## 2017-11-19 ENCOUNTER — Encounter: Payer: Self-pay | Admitting: Internal Medicine

## 2017-11-19 DIAGNOSIS — E559 Vitamin D deficiency, unspecified: Secondary | ICD-10-CM

## 2017-11-19 MED ORDER — CHOLECALCIFEROL 1.25 MG (50000 UT) PO CAPS: 50000.0000 [IU] | ORAL_CAPSULE | ORAL | 1 refills | Status: DC

## 2017-11-19 NOTE — Telephone Encounter (Signed)
See imaging results in result notes also

## 2017-11-19 NOTE — Telephone Encounter (Signed)
Labs liver kidneys normal  Blood cts normal  Vit D low sent prescription to [facility] A1C 6.6 diabetic  -does he want to start medication for diabetes? -would he like nutrition referral?   Thyroid labs normal  Autoimmune labs so far normal and PSA normal  rec hepatitis B vaccine in future   [employer]

## 2017-11-20 LAB — HEPATITIS B SURFACE ANTIGEN: Hepatitis B Surface Ag: NONREACTIVE

## 2017-11-20 LAB — TEST AUTHORIZATION

## 2017-11-20 LAB — MEASLES/MUMPS/RUBELLA IMMUNITY
Mumps IgG: >300 AU/mL
Rubella: 14.8 index
Rubeola IgG: 208 AU/mL

## 2017-11-20 LAB — HEPATITIS C ANTIBODY
HEP C AB: NONREACTIVE
SIGNAL TO CUT-OFF: 0.02 (ref <1.00)

## 2017-11-20 LAB — HEPATITIS B SURFACE ANTIBODY, QUANTITATIVE: Hepatitis B-Post: <5 m[IU]/mL — ABNORMAL LOW (ref >=10)

## 2017-11-20 LAB — CYCLIC CITRUL PEPTIDE ANTIBODY, IGG: Cyclic Citrullin Peptide Ab: <16 UNITS

## 2017-11-20 LAB — RHEUMATOID FACTOR: Rhuematoid fact SerPl-aCnc: <14 IU/mL (ref <14)

## 2017-11-20 LAB — ANA: Anti Nuclear Antibody(ANA): NEGATIVE

## 2017-11-21 NOTE — Telephone Encounter (Signed)
Please see lab results.

## 2017-11-22 ENCOUNTER — Telehealth: Payer: Self-pay | Admitting: Internal Medicine

## 2017-11-22 NOTE — Telephone Encounter (Signed)
Patient calling again requesting to know something today

## 2017-11-22 NOTE — Telephone Encounter (Signed)
Copied from [place] [id number]. Topic: Quick Communication - See Telephone Encounter >> Nov 22, 2017 10:24 AM Bea Graff, NT wrote: CRM for notification. See Telephone encounter for: 11/22/17. Pt calling and states he was given lab results yesterday and it was mentioned that medication will be called in for his diabetes. Pt calling to check status on that medication being called in. He would like a call back if possible before he eats at 11:00am.

## 2017-11-22 NOTE — Telephone Encounter (Signed)
Please advise 

## 2017-11-23 ENCOUNTER — Other Ambulatory Visit: Payer: Self-pay | Admitting: Internal Medicine

## 2017-11-23 DIAGNOSIS — Z794 Long term (current) use of insulin: Principal | ICD-10-CM

## 2017-11-23 DIAGNOSIS — E119 Type 2 diabetes mellitus without complications: Secondary | ICD-10-CM

## 2017-11-23 MED ORDER — METFORMIN HCL 500 MG PO TABS: 500.0000 mg | ORAL_TABLET | Freq: Every day | ORAL | 3 refills | Status: AC

## 2017-11-23 MED ORDER — METFORMIN HCL 500 MG PO TABS: 500.0000 mg | ORAL_TABLET | Freq: Every day | ORAL | 0 refills | Status: DC

## 2017-11-23 NOTE — Telephone Encounter (Signed)
Patient called and says "I was given my labs on Wednesday and was told I was going to get something called in for diabetes. I have been checking with my pharmacy and nothing has been called in yet. Will you find out about it?" I advised I will send this to Dr. Aundra Dubin and someone from the office will call back with her recommendation. He says to use the pharmacy below:  [facility] [address] ([address]), [place] [phone] (Phone) [phone] (Fax)

## 2017-11-23 NOTE — Telephone Encounter (Signed)
Patient called again about a medication . He is unsure of what type of medicine this is . Please advise Thank you

## 2017-11-23 NOTE — Telephone Encounter (Signed)
Sent to [facility] and [facility] temp supply metformin 1x per day with food   [place]

## 2017-11-23 NOTE — Telephone Encounter (Signed)
I don't see which medication he was supposed to start?

## 2017-11-25 ENCOUNTER — Encounter: Payer: Self-pay | Admitting: Internal Medicine

## 2017-11-25 ENCOUNTER — Other Ambulatory Visit: Payer: Self-pay | Admitting: Internal Medicine

## 2017-11-25 DIAGNOSIS — M170 Bilateral primary osteoarthritis of knee: Secondary | ICD-10-CM

## 2017-11-26 ENCOUNTER — Telehealth: Payer: Self-pay | Admitting: *Deleted

## 2017-11-26 NOTE — Telephone Encounter (Signed)
Copied from [place] [id number]. Topic: Quick Communication - See Telephone Encounter >> Nov 22, 2017 10:24 AM Bea Graff, NT wrote: CRM for notification. See Telephone encounter for: 11/22/17. Pt calling and states he was given lab results yesterday and it was mentioned that medication will be called in for his diabetes. Pt calling to check status on that medication being called in. He would like a call back if possible before he eats at 11:00am.  >> Nov 23, 2017  3:47 PM Percell Belt A wrote: Pt called in and is checking to see what med was called in for his diabetes?  He was just checking the status  Please advise

## 2017-11-26 NOTE — Telephone Encounter (Signed)
Left message for patient to return call back. [facility] may give information.  metformin is the medication it has been sent to [facility] like he asked it to be. It should be there this week in the mail.

## 2017-11-27 ENCOUNTER — Ambulatory Visit: Payer: Medicare HMO

## 2017-11-28 ENCOUNTER — Emergency Department
Admission: EM | Admit: 2017-11-28 | Discharge: 2017-11-28 | Disposition: A | Discharge status: Home / Self Care | Payer: Medicare HMO | Attending: Emergency Medicine | Admitting: Emergency Medicine

## 2017-11-28 ENCOUNTER — Encounter: Payer: Self-pay | Admitting: Emergency Medicine

## 2017-11-28 ENCOUNTER — Other Ambulatory Visit: Payer: Self-pay

## 2017-11-28 DIAGNOSIS — Z87891 Personal history of nicotine dependence: Secondary | ICD-10-CM | POA: Insufficient documentation

## 2017-11-28 DIAGNOSIS — J449 Chronic obstructive pulmonary disease, unspecified: Secondary | ICD-10-CM | POA: Insufficient documentation

## 2017-11-28 DIAGNOSIS — Y929 Unspecified place or not applicable: Secondary | ICD-10-CM | POA: Diagnosis not present

## 2017-11-28 DIAGNOSIS — I10 Essential (primary) hypertension: Secondary | ICD-10-CM | POA: Insufficient documentation

## 2017-11-28 DIAGNOSIS — W57XXXA Bitten or stung by nonvenomous insect and other nonvenomous arthropods, initial encounter: Secondary | ICD-10-CM | POA: Diagnosis not present

## 2017-11-28 DIAGNOSIS — Z7902 Long term (current) use of antithrombotics/antiplatelets: Secondary | ICD-10-CM | POA: Diagnosis not present

## 2017-11-28 DIAGNOSIS — S30860A Insect bite (nonvenomous) of lower back and pelvis, initial encounter: Secondary | ICD-10-CM | POA: Diagnosis not present

## 2017-11-28 DIAGNOSIS — S30860D Insect bite (nonvenomous) of lower back and pelvis, subsequent encounter: Secondary | ICD-10-CM | POA: Diagnosis not present

## 2017-11-28 DIAGNOSIS — S70361A Insect bite (nonvenomous), right thigh, initial encounter: Secondary | ICD-10-CM | POA: Insufficient documentation

## 2017-11-28 DIAGNOSIS — Y939 Activity, unspecified: Secondary | ICD-10-CM | POA: Diagnosis not present

## 2017-11-28 DIAGNOSIS — S40861A Insect bite (nonvenomous) of right upper arm, initial encounter: Secondary | ICD-10-CM | POA: Diagnosis not present

## 2017-11-28 DIAGNOSIS — Z7984 Long term (current) use of oral hypoglycemic drugs: Secondary | ICD-10-CM | POA: Diagnosis not present

## 2017-11-28 DIAGNOSIS — Y999 Unspecified external cause status: Secondary | ICD-10-CM | POA: Diagnosis not present

## 2017-11-28 DIAGNOSIS — S40861D Insect bite (nonvenomous) of right upper arm, subsequent encounter: Secondary | ICD-10-CM | POA: Insufficient documentation

## 2017-11-28 DIAGNOSIS — I2510 Atherosclerotic heart disease of native coronary artery without angina pectoris: Secondary | ICD-10-CM | POA: Diagnosis not present

## 2017-11-28 DIAGNOSIS — L089 Local infection of the skin and subcutaneous tissue, unspecified: Secondary | ICD-10-CM | POA: Diagnosis not present

## 2017-11-28 DIAGNOSIS — E119 Type 2 diabetes mellitus without complications: Secondary | ICD-10-CM | POA: Diagnosis not present

## 2017-11-28 DIAGNOSIS — L989 Disorder of the skin and subcutaneous tissue, unspecified: Secondary | ICD-10-CM | POA: Diagnosis present

## 2017-11-28 DIAGNOSIS — Z7982 Long term (current) use of aspirin: Secondary | ICD-10-CM | POA: Insufficient documentation

## 2017-11-28 MED ORDER — CLINDAMYCIN HCL 300 MG PO CAPS: 300.0000 mg | ORAL_CAPSULE | Freq: Three times a day (TID) | ORAL | 0 refills | Status: DC

## 2017-11-28 MED ORDER — HYDROXYZINE HCL 25 MG PO TABS: 25.0000 mg | ORAL_TABLET | Freq: Three times a day (TID) | ORAL | 0 refills | Status: DC | PRN

## 2017-11-28 NOTE — ED Provider Notes (Signed)
[facility] Emergency Department Provider Note  ____________________________________________  Time seen: Approximately 8:17 PM  I have reviewed the triage vital signs and the nursing notes.   HISTORY  Chief Complaint Insect Bite   HPI Nicholas Reyes is a 62 y.o. male who presents to the emergency department for treatment and evaluation of lesions on his right upper extremity, right thigh, and left buttock.  He was evaluated here about 10 days ago for the same and given antibiotic ointment and "itching pills."  He states that his symptoms have not improved.  He has noticed a new lesion on the right thigh that was not here on his initial visit.  He denies fever.  Past Medical History:  Diagnosis Date  . Arthritis   . CAD (coronary artery disease)   . COPD (chronic obstructive pulmonary disease) ([place])   . Depression   . Diabetes mellitus   . Heart disease    cad with 3 stents   . Hypertension   . Migraines   . Psoriasis    joint pain in knees left hand     Patient Active Problem List   Diagnosis Date Noted  . HTN (hypertension) 11/15/2017  . CAD (coronary artery disease) 11/15/2017  . Psoriasis 11/15/2017  . Skin sore 11/15/2017  . COPD (chronic obstructive pulmonary disease) ([place]) 11/15/2017  . Diabetes mellitus without complication ([place]) 22/29/7989  . Vitamin D deficiency 11/15/2017  . Osteoarthritis 11/15/2017    Past Surgical History:  Procedure Laterality Date  . APPENDECTOMY    . CATARACT EXTRACTION Bilateral    inserted lens b/l eyes right 03/12/17, left 05/28/17   . CHOLECYSTECTOMY    . CORONARY ANGIOPLASTY WITH STENT PLACEMENT     LAD, LCX-OM, ? 3rd location  . EYE SURGERY     cataract b/l   . HEMORRHOID SURGERY    . hemorrhoid surgery      x 2   . KNEE SURGERY     right knee arthroscopy, partial synovectecomy 04/21/15 and meniscectomy/chondroplasty   . right knee surgery     ? type of procedure   . STENT PLACEMENT VASCULAR ([place]  HX)    . TONSILLECTOMY    . TONSILLECTOMY AND ADENOIDECTOMY      Prior to Admission medications   Medication Sig Start Date End Date Taking? Authorizing Provider  albuterol (PROVENTIL HFA;VENTOLIN HFA) 108 (90 BASE) MCG/ACT inhaler Inhale 2 puffs into the lungs every 4 (four) hours as needed. For shortness of breath    [provider]  amLODipine (NORVASC) 5 MG tablet Take 5 mg by mouth daily.    [provider]  aspirin 81 MG EC tablet Take 81 mg by mouth daily.     [provider]  atorvastatin (LIPITOR) 20 MG tablet Take 20 mg by mouth daily.    [provider]  budesonide-formoterol (SYMBICORT) 80-4.5 MCG/ACT inhaler Inhale 2 puffs into the lungs 2 (two) times daily.    [provider]  cetirizine (ZYRTEC) 10 MG tablet Take 10 mg by mouth daily.    [provider]  Cholecalciferol 50000 units capsule Take 1 capsule (50,000 Units total) by mouth once a week. 11/19/17   McLean-Scocuzza, Nino Glow, MD  clindamycin (CLEOCIN) 300 MG capsule Take 1 capsule (300 mg total) by mouth 3 (three) times daily for 10 days. 11/28/17 12/08/17  Jaman Aro, Johnette Abraham B, FNP  clopidogrel (PLAVIX) 75 MG tablet Take 75 mg by mouth daily.    [provider]  DULoxetine (  CYMBALTA) 60 MG capsule Take 60 mg by mouth daily.    [provider]  furosemide (LASIX) 40 MG tablet Take 40 mg by mouth daily.    [provider]  gabapentin (NEURONTIN) 300 MG capsule Take 300-600 mg by mouth 2 (two) times daily. 300 mg qd and 600 mg qhs    [provider]  hydrOXYzine (ATARAX/VISTARIL) 25 MG tablet Take 1 tablet (25 mg total) by mouth 3 (three) times daily as needed. 11/28/17   Dhrithi Riche, Johnette Abraham B, FNP  isosorbide mononitrate (IMDUR) 30 MG 24 hr tablet Take 30 mg by mouth daily. Per pharmacy taking 20 mg qd    [provider]  metFORMIN (GLUCOPHAGE) 500 MG tablet Take 1 tablet (500 mg total) by mouth daily with breakfast. 11/23/17    McLean-Scocuzza, Nino Glow, MD  metFORMIN (GLUCOPHAGE) 500 MG tablet Take 1 tablet (500 mg total) by mouth daily with breakfast. 11/23/17   McLean-Scocuzza, Nino Glow, MD  methylPREDNISolone (MEDROL DOSEPAK) 4 MG TBPK tablet Take Tapered dose as directed 11/18/17   Sable Feil, PA-C  metoprolol tartrate (LOPRESSOR) 25 MG tablet Take 25 mg by mouth 2 (two) times daily.    [provider]  montelukast (SINGULAIR) 10 MG tablet Take 10 mg by mouth at bedtime.    [provider]  mupirocin ointment (BACTROBAN) 2 % Apply to affected area 3 times daily 11/18/17 11/18/18  Sable Feil, PA-C  nitroGLYCERIN (NITROSTAT) 0.4 MG SL tablet Place 0.4 mg under the tongue every 5 (five) minutes as needed. For chest pain    [provider]  pantoprazole (PROTONIX) 40 MG tablet Take 40 mg by mouth daily.    [provider]  ranitidine (ZANTAC) 150 MG/10ML syrup 150 mg 2 (two) times daily. Taking 10 ml bid    [provider]  Secukinumab (COSENTYX 300 DOSE [place]) Inject into the skin every 30 (thirty) days.    [provider]  spironolactone (ALDACTONE) 25 MG tablet Take 25 mg by mouth daily.    [provider]  triamcinolone cream (KENALOG) 0.1 % Apply 1 application topically 2 (two) times daily. Not face, groin, underarms 11/15/17   McLean-Scocuzza, Nino Glow, MD    Allergies Patient has no known allergies.  Family History  Problem Relation Age of Onset  . Heart disease Father     Social History Social History   Tobacco Use  . Smoking status: Former Research scientist (life sciences)  . Smokeless tobacco: Never Used  Substance Use Topics  . Alcohol use: No    Frequency: Never  . Drug use: No    Review of Systems  Constitutional: Negative for fever. Respiratory: Negative for cough or shortness of breath.  Musculoskeletal: Negative for myalgias Skin: Positive for scattered lesions over the extremities Neurological: Negative for numbness or  paresthesias. ____________________________________________   PHYSICAL EXAM:  VITAL SIGNS: ED Triage Vitals  Enc Vitals Group     BP 11/28/17 1913 (!) 144/73     Pulse Rate 11/28/17 1913 89     Resp 11/28/17 1913 16     Temp 11/28/17 1913 98.6 F (37 C)     Temp Source 11/28/17 1913 Oral     SpO2 11/28/17 1913 95 %     Weight 11/28/17 1914 231 lb (104.8 kg)     Height 11/28/17 1914 5\' 7"  (1.702 m)     Head Circumference --      Peak Flow --      Pain Score 11/28/17 1914 10  Pain Loc --      Pain Edu? --      Excl. in [place]? --      Constitutional: Chronically ill appearing. Eyes: Conjunctivae are clear without discharge or drainage. Nose: No rhinorrhea noted. Mouth/Throat: Airway is patent.  Neck: No stridor. Unrestricted range of motion observed. Cardiovascular: Capillary refill is <3 seconds.  Respiratory: Respirations are even and unlabored.. Musculoskeletal: Unrestricted range of motion observed. Neurologic: Awake, alert, and oriented x 4.  Skin: Well-circumscribed, erythematous, scabbed lesions over the posterior aspect of the right upper arm, right thigh, and left buttock.  Mild surrounding erythema without fluctuance or induration.  ____________________________________________   LABS (all labs ordered are listed, but only abnormal results are displayed)  Labs Reviewed - No data to display ____________________________________________  EKG  Not indicated. ____________________________________________  RADIOLOGY  Not indicated ____________________________________________   PROCEDURES  Procedures ____________________________________________   INITIAL IMPRESSION / ASSESSMENT AND PLAN / ED COURSE  EDGEL DEGNAN is a 62 y.o. male who presents to the emergency department for treatment and evaluation of lesions to his extremities and left side but.  He will be treated with clindamycin and given another prescription for hydroxyzine.  He is to follow-up  with his primary care provider for symptoms that are not improving over the next couple of days.  He was encouraged to return to the emergency department for symptoms of change or worsen if he is unable to schedule an appointment.   Medications - No data to display   Pertinent labs & imaging results that were available during my care of the patient were reviewed by me and considered in my medical decision making (see chart for details).  ____________________________________________   FINAL CLINICAL IMPRESSION(S) / ED DIAGNOSES  Final diagnoses:  Bug bite with infection, initial encounter    ED Discharge Orders        Ordered    clindamycin (CLEOCIN) 300 MG capsule  3 times daily     11/28/17 1959    hydrOXYzine (ATARAX/VISTARIL) 25 MG tablet  3 times daily PRN     11/28/17 1959       Note:  This document was prepared using Dragon voice recognition software and may include unintentional dictation errors.    Victorino Dike, FNP 11/28/17 2022    Arta Silence, MD 11/29/17 0004

## 2017-11-28 NOTE — ED Triage Notes (Addendum)
Pt presents to ED with multiple insect bites that are not healing well since last visit on 7/14 for the same complaint. Pt states he has been putting his cream on it every night with no relief. Denies fever. several scabs noted to underside of right arm. Pt scratching affected area during triage.

## 2017-11-28 NOTE — ED Notes (Signed)
Pt has red rash on right forearm, right thigh, and left buttock.  Pt was seen here recently for similar sx.  States meds did not help.

## 2017-11-29 ENCOUNTER — Telehealth: Payer: Self-pay | Admitting: Internal Medicine

## 2017-11-29 NOTE — Telephone Encounter (Unsigned)
Copied from [place] [id number]. Topic: Quick Communication - See Telephone Encounter >> Nov 29, 2017 12:36 PM Burchel, Abbi R wrote: CRM for notification. See Telephone encounter for: 11/29/17.  Pt requesting Rx for patches (flector)  he uses for knee pain, as well as pain meds for his knee.  Pt: [phone]

## 2017-11-30 ENCOUNTER — Other Ambulatory Visit: Payer: Self-pay | Admitting: Internal Medicine

## 2017-11-30 ENCOUNTER — Ambulatory Visit: Payer: Self-pay

## 2017-11-30 DIAGNOSIS — G8929 Other chronic pain: Secondary | ICD-10-CM

## 2017-11-30 DIAGNOSIS — M25561 Pain in right knee: Principal | ICD-10-CM

## 2017-11-30 DIAGNOSIS — M25562 Pain in left knee: Principal | ICD-10-CM

## 2017-11-30 MED ORDER — DICLOFENAC EPOLAMINE 1.3 % TD PTCH: 1.0000 | MEDICATED_PATCH | Freq: Two times a day (BID) | TRANSDERMAL | 2 refills | Status: AC

## 2017-11-30 NOTE — Telephone Encounter (Signed)
Incoming call from patient  With complaint of "severe headache Assessed patient per protocol. Told patient according to protocol  I recommended he go to ED for further Assessment.  Patient really requested to been seen in office.  Assessed if any "same day"  Appointments were available.  None available. Patient disconnected phone call abruptly. Will notify office.    Reason for Disposition . [1] SEVERE headache (e.g., excruciating) AND [2] "worst headache" of life  Answer Assessment - Initial Assessment Questions 1. LOCATION: "Where does it hurt?"      **[place] 2. ONSET: "When did the headache start?" (Minutes, hours or days)      30 minutes ago 3. PATTERN: "Does the pain come and go, or has it been constant since it started?"     constant 4. SEVERITY: "How bad is the pain?" and "What does it keep you from doing?"  (e.g., Scale 1-10; mild, moderate, or severe)   - MILD (1-3): doesn't interfere with normal activities    - MODERATE (4-7): interferes with normal activities or awakens from sleep    - SEVERE (8-10): excruciating pain, unable to do any normal activities        severe 5. RECURRENT SYMPTOM: "Have you ever had headaches before?" If so, ask: "When was the last time?" and "What happened that time?"      no 6. CAUSE: "What do you think is causing the headache?"     no 7. MIGRAINE: "Have you been diagnosed with migraine headaches?" If so, ask: "Is this headache similar?"      no 8. HEAD INJURY: "Has there been any recent injury to the head?"      no 9. OTHER SYMPTOMS: "Do you have any other symptoms?" (fever, stiff neck, eye pain, sore throat, cold symptoms)     no 10. PREGNANCY: "Is there any chance you are pregnant?" "When was your last menstrual period?"       na  Protocols used: HEADACHE-A-AH

## 2017-11-30 NOTE — Telephone Encounter (Signed)
FYI

## 2017-11-30 NOTE — Telephone Encounter (Signed)
Sent patches he can try Tylenol 500 mg up to 6 pills in 1 day and f/u with ortho  Is appt made yet?  [place]

## 2017-11-30 NOTE — Telephone Encounter (Signed)
rec ED for further evaluation

## 2017-11-30 NOTE — Telephone Encounter (Signed)
Please advise 

## 2017-11-30 NOTE — Telephone Encounter (Signed)
This encounter was created in error - please disregard.

## 2017-12-03 ENCOUNTER — Telehealth: Payer: Self-pay | Admitting: Internal Medicine

## 2017-12-03 NOTE — Telephone Encounter (Signed)
ROI received on 12/03/17 from [place].

## 2017-12-04 ENCOUNTER — Encounter: Payer: Self-pay | Admitting: Internal Medicine

## 2017-12-04 ENCOUNTER — Telehealth: Payer: Self-pay | Admitting: Internal Medicine

## 2017-12-04 ENCOUNTER — Ambulatory Visit (INDEPENDENT_AMBULATORY_CARE_PROVIDER_SITE_OTHER): Payer: Medicare HMO | Admitting: Internal Medicine

## 2017-12-04 VITALS — BP 134/70 | HR 88 | Temp 98.0°F | Ht 67.0 in | Wt 225.4 lb

## 2017-12-04 DIAGNOSIS — E119 Type 2 diabetes mellitus without complications: Secondary | ICD-10-CM | POA: Diagnosis not present

## 2017-12-04 DIAGNOSIS — J449 Chronic obstructive pulmonary disease, unspecified: Secondary | ICD-10-CM | POA: Diagnosis not present

## 2017-12-04 DIAGNOSIS — L989 Disorder of the skin and subcutaneous tissue, unspecified: Secondary | ICD-10-CM

## 2017-12-04 DIAGNOSIS — E559 Vitamin D deficiency, unspecified: Secondary | ICD-10-CM

## 2017-12-04 DIAGNOSIS — L0390 Cellulitis, unspecified: Secondary | ICD-10-CM | POA: Diagnosis not present

## 2017-12-04 DIAGNOSIS — M1990 Unspecified osteoarthritis, unspecified site: Secondary | ICD-10-CM | POA: Diagnosis not present

## 2017-12-04 DIAGNOSIS — Z23 Encounter for immunization: Secondary | ICD-10-CM | POA: Diagnosis not present

## 2017-12-04 DIAGNOSIS — I10 Essential (primary) hypertension: Secondary | ICD-10-CM | POA: Diagnosis not present

## 2017-12-04 DIAGNOSIS — L720 Epidermal cyst: Secondary | ICD-10-CM

## 2017-12-04 DIAGNOSIS — R4189 Other symptoms and signs involving cognitive functions and awareness: Secondary | ICD-10-CM

## 2017-12-04 LAB — LIPID PANEL
CHOLESTEROL: 178 mg/dL (ref 0–200)
HDL: 43.4 mg/dL (ref >39.00)
NONHDL: 134.43
TRIGLYCERIDES: 245 mg/dL — AB (ref 0.0–149.0)
Total CHOL/HDL Ratio: 4
VLDL: 49 mg/dL — ABNORMAL HIGH (ref 0.0–40.0)

## 2017-12-04 LAB — MICROALBUMIN / CREATININE URINE RATIO
CREATININE, U: 334.8 mg/dL
MICROALB UR: 3.2 mg/dL — AB (ref 0.0–1.9)
MICROALB/CREAT RATIO: 1 mg/g (ref 0.0–30.0)

## 2017-12-04 LAB — LDL CHOLESTEROL, DIRECT: LDL DIRECT: 114 mg/dL

## 2017-12-04 MED ORDER — DOXYCYCLINE HYCLATE 100 MG PO TABS
100.0000 mg | ORAL_TABLET | Freq: Two times a day (BID) | ORAL | 0 refills | Status: DC
Start: 2017-12-04 — End: 2018-05-14

## 2017-12-04 NOTE — Telephone Encounter (Signed)
He is scheduled for Tuesday 8/6 at 130. They said that they tried calling him but his vm was full. Thanks, [employer]

## 2017-12-04 NOTE — Telephone Encounter (Signed)
Patient has not answered phone call but is scheduled to be here for appointment today.

## 2017-12-04 NOTE — Patient Instructions (Addendum)
Always take antibiotics with food and yogurt the best  Stop clindamycin and take Doxycycline and take it with food as well as metformin  Call dermatology as well to move appt up [phone]  F/u 1-2 months move appt back   Pneumococcal Polysaccharide Vaccine: What You Need to Know 1. Why get vaccinated? Vaccination can protect older adults (and some children and younger adults) from pneumococcal disease. Pneumococcal disease is caused by bacteria that can spread from person to person through close contact. It can cause ear infections, and it can also lead to more serious infections of the:  Lungs (pneumonia),  Blood (bacteremia), and  Covering of the brain and spinal cord (meningitis). Meningitis can cause deafness and brain damage, and it can be fatal.  Anyone can get pneumococcal disease, but children under 49 years of age, people with certain medical conditions, adults over 66 years of age, and cigarette smokers are at the highest risk. About 18,000 older adults die each year from pneumococcal disease in the [place]. Treatment of pneumococcal infections with penicillin and other drugs used to be more effective. But some strains of the disease have become resistant to these drugs. This makes prevention of the disease, through vaccination, even more important. 2. Pneumococcal polysaccharide vaccine (PPSV23) Pneumococcal polysaccharide vaccine (PPSV23) protects against 23 types of pneumococcal bacteria. It will not prevent all pneumococcal disease. PPSV23 is recommended for:  All adults 65 years of age and older,  Anyone 2 through 61 years of age with certain long-term health problems,  Anyone 2 through 61 years of age with a weakened immune system,  Adults 26 through 61 years of age who smoke cigarettes or have asthma.  Most people need only one dose of PPSV. A second dose is recommended for certain high-risk groups. People 69 and older should get a dose even if they have gotten  one or more doses of the vaccine before they turned 65. Your healthcare provider can give you more information about these recommendations. Most healthy adults develop protection within 2 to 3 weeks of getting the shot. 3. Some people should not get this vaccine  Anyone who has had a life-threatening allergic reaction to PPSV should not get another dose.  Anyone who has a severe allergy to any component of PPSV should not receive it. Tell your provider if you have any severe allergies.  Anyone who is moderately or severely ill when the shot is scheduled may be asked to wait until they recover before getting the vaccine. Someone with a mild illness can usually be vaccinated.  Children less than 36 years of age should not receive this vaccine.  There is no evidence that PPSV is harmful to either a pregnant woman or to her fetus. However, as a precaution, women who need the vaccine should be vaccinated before becoming pregnant, if possible. 4. Risks of a vaccine reaction With any medicine, including vaccines, there is a chance of side effects. These are usually mild and go away on their own, but serious reactions are also possible. About half of people who get PPSV have mild side effects, such as redness or pain where the shot is given, which go away within about two days. Less than 1 out of 100 people develop a fever, muscle aches, or more severe local reactions. Problems that could happen after any vaccine:  People sometimes faint after a medical procedure, including vaccination. Sitting or lying down for about 15 minutes can help prevent fainting, and injuries caused  by a fall. Tell your doctor if you feel dizzy, or have vision changes or ringing in the ears.  Some people get severe pain in the shoulder and have difficulty moving the arm where a shot was given. This happens very rarely.  Any medication can cause a severe allergic reaction. Such reactions from a vaccine are very rare, estimated  at about 1 in a million doses, and would happen within a few minutes to a few hours after the vaccination. As with any medicine, there is a very remote chance of a vaccine causing a serious injury or death. The safety of vaccines is always being monitored. For more information, visit: http://www.aguilar.org/ 5. What if there is a serious reaction? What should I look for? Look for anything that concerns you, such as signs of a severe allergic reaction, very high fever, or unusual behavior. Signs of a severe allergic reaction can include hives, swelling of the face and throat, difficulty breathing, a fast heartbeat, dizziness, and weakness. These would usually start a few minutes to a few hours after the vaccination. What should I do? If you think it is a severe allergic reaction or other emergency that can't wait, call 9-1-1 or get to the nearest hospital. Otherwise, call your doctor. Afterward, the reaction should be reported to the Vaccine Adverse Event Reporting System (VAERS). Your doctor might file this report, or you can do it yourself through the VAERS web site at www.vaers.SamedayNews.es, or by calling 914-586-7866. VAERS does not give medical advice. 6. How can I learn more?  Ask your doctor. He or she can give you the vaccine package insert or suggest other sources of information.  Call your local or state health department.  Contact the Centers for Disease Control and Prevention (CDC): ? Call 701 501 3470 (1-800-CDC-INFO) or ? Visit CDC's website at http://hunter.com/ CDC Pneumococcal Polysaccharide Vaccine VIS (08/29/13) This information is not intended to replace advice given to you by your health care provider. Make sure you discuss any questions you have with your health care provider. Document Released: 02/19/2006 Document Revised: 01/13/2016 Document Reviewed: 01/13/2016 Elsevier Interactive Patient Education  2017 [place].    Diabetes Mellitus and Nutrition When  you have diabetes (diabetes mellitus), it is very important to have healthy eating habits because your blood sugar (glucose) levels are greatly affected by what you eat and drink. Eating healthy foods in the appropriate amounts, at about the same times every day, can help you:  Control your blood glucose.  Lower your risk of heart disease.  Improve your blood pressure.  Reach or maintain a healthy weight.  Every person with diabetes is different, and each person has different needs for a meal plan. Your health care provider may recommend that you work with a diet and nutrition specialist (dietitian) to make a meal plan that is best for you. Your meal plan may vary depending on factors such as:  The calories you need.  The medicines you take.  Your weight.  Your blood glucose, blood pressure, and cholesterol levels.  Your activity level.  Other health conditions you have, such as heart or kidney disease.  How do carbohydrates affect me? Carbohydrates affect your blood glucose level more than any other type of food. Eating carbohydrates naturally increases the amount of glucose in your blood. Carbohydrate counting is a method for keeping track of how many carbohydrates you eat. Counting carbohydrates is important to keep your blood glucose at a healthy level, especially if you use insulin or take  certain oral diabetes medicines. It is important to know how many carbohydrates you can safely have in each meal. This is different for every person. Your dietitian can help you calculate how many carbohydrates you should have at each meal and for snack. Foods that contain carbohydrates include:  Bread, cereal, rice, pasta, and crackers.  Potatoes and corn.  Peas, beans, and lentils.  Milk and yogurt.  Fruit and juice.  Desserts, such as cakes, cookies, ice cream, and candy.  How does alcohol affect me? Alcohol can cause a sudden decrease in blood glucose (hypoglycemia), especially if  you use insulin or take certain oral diabetes medicines. Hypoglycemia can be a life-threatening condition. Symptoms of hypoglycemia (sleepiness, dizziness, and confusion) are similar to symptoms of having too much alcohol. If your health care provider says that alcohol is safe for you, follow these guidelines:  Limit alcohol intake to no more than 1 drink per day for nonpregnant women and 2 drinks per day for men. One drink equals 12 oz of beer, 5 oz of wine, or 1 oz of hard liquor.  Do not drink on an empty stomach.  Keep yourself hydrated with water, diet soda, or unsweetened iced tea.  Keep in mind that regular soda, juice, and other mixers may contain a lot of sugar and must be counted as carbohydrates.  What are tips for following this plan? Reading food labels  Start by checking the serving size on the label. The amount of calories, carbohydrates, fats, and other nutrients listed on the label are based on one serving of the food. Many foods contain more than one serving per package.  Check the total grams (g) of carbohydrates in one serving. You can calculate the number of servings of carbohydrates in one serving by dividing the total carbohydrates by 15. For example, if a food has 30 g of total carbohydrates, it would be equal to 2 servings of carbohydrates.  Check the number of grams (g) of saturated and trans fats in one serving. Choose foods that have low or no amount of these fats.  Check the number of milligrams (mg) of sodium in one serving. Most people should limit total sodium intake to less than 2,300 mg per day.  Always check the nutrition information of foods labeled as "low-fat" or "nonfat". These foods may be higher in added sugar or refined carbohydrates and should be avoided.  Talk to your dietitian to identify your daily goals for nutrients listed on the label. Shopping  Avoid buying canned, premade, or processed foods. These foods tend to be high in fat, sodium,  and added sugar.  Shop around the outside edge of the grocery store. This includes fresh fruits and vegetables, bulk grains, fresh meats, and fresh dairy. Cooking  Use low-heat cooking methods, such as baking, instead of high-heat cooking methods like deep frying.  Cook using healthy oils, such as olive, canola, or sunflower oil.  Avoid cooking with butter, cream, or high-fat meats. Meal planning  Eat meals and snacks regularly, preferably at the same times every day. Avoid going long periods of time without eating.  Eat foods high in fiber, such as fresh fruits, vegetables, beans, and whole grains. Talk to your dietitian about how many servings of carbohydrates you can eat at each meal.  Eat 4-6 ounces of lean protein each day, such as lean meat, chicken, fish, eggs, or tofu. 1 ounce is equal to 1 ounce of meat, chicken, or fish, 1 egg, or 1/4 cup of tofu.  Eat some foods each day that contain healthy fats, such as avocado, nuts, seeds, and fish. Lifestyle   Check your blood glucose regularly.  Exercise at least 30 minutes 5 or more days each week, or as told by your health care provider.  Take medicines as told by your health care provider.  Do not use any products that contain nicotine or tobacco, such as cigarettes and e-cigarettes. If you need help quitting, ask your health care provider.  Work with a Social worker or diabetes educator to identify strategies to manage stress and any emotional and social challenges. What are some questions to ask my health care provider?  Do I need to meet with a diabetes educator?  Do I need to meet with a dietitian?  What number can I call if I have questions?  When are the best times to check my blood glucose? Where to find more information:  American Diabetes Association: diabetes.org/food-and-fitness/food  Academy of Nutrition and Dietetics: PokerClues.dk  [employer] of  Diabetes and Digestive and Kidney Diseases (NIH): ContactWire.be Summary  A healthy meal plan will help you control your blood glucose and maintain a healthy lifestyle.  Working with a diet and nutrition specialist (dietitian) can help you make a meal plan that is best for you.  Keep in mind that carbohydrates and alcohol have immediate effects on your blood glucose levels. It is important to count carbohydrates and to use alcohol carefully. This information is not intended to replace advice given to you by your health care provider. Make sure you discuss any questions you have with your health care provider. Document Released: 01/19/2005 Document Revised: 05/29/2016 Document Reviewed: 05/29/2016 Elsevier Interactive Patient Education  [employer].

## 2017-12-04 NOTE — Telephone Encounter (Unsigned)
Copied from [place] [phone]. Topic: Quick Communication - See Telephone Encounter >> Dec 04, 2017  4:36 PM Neva Seat wrote: Pt went of [facility] to pick up the vitamins Dr. Aundra Dubin had prescribed for him.  However, they were not called in. Pt wants to know if Dr. Aundra Dubin is going to have those called in for him.

## 2017-12-04 NOTE — Progress Notes (Addendum)
Chief Complaint  Patient presents with  . Follow-up   F/u  1. Left cyst vs skin lesion/face cellulitis left cheek on clindamycin 300 tid 2x per day since ED visit wants to move dermatology appt up  2. Knee OA b/l R>L pending ortho appt  3. HTN controlled on meds   Review of Systems  Constitutional: Negative for weight loss.  HENT: Negative for hearing loss.   Eyes: Negative for blurred vision.  Respiratory: Negative for shortness of breath.   Cardiovascular: Negative for chest pain.  Gastrointestinal: Positive for diarrhea.       2x per day   Skin: Positive for rash.  Psychiatric/Behavioral: Negative for depression.   Past Medical History:  Diagnosis Date  . Arthritis   . CAD (coronary artery disease)   . COPD (chronic obstructive pulmonary disease) ([place])   . Depression   . Diabetes mellitus   . Heart disease    cad with 3 stents   . Hypertension   . Migraines   . Psoriasis    joint pain in knees left hand    Past Surgical History:  Procedure Laterality Date  . APPENDECTOMY    . CATARACT EXTRACTION Bilateral    inserted lens b/l eyes right 03/12/17, left 05/28/17   . CHOLECYSTECTOMY    . CORONARY ANGIOPLASTY WITH STENT PLACEMENT     LAD, LCX-OM, ? 3rd location  . EYE SURGERY     cataract b/l   . HEMORRHOID SURGERY    . hemorrhoid surgery      x 2   . KNEE SURGERY     right knee arthroscopy, partial synovectecomy 04/21/15 and meniscectomy/chondroplasty   . right knee surgery     ? type of procedure   . STENT PLACEMENT VASCULAR ([facility])    . TONSILLECTOMY    . TONSILLECTOMY AND ADENOIDECTOMY     Family History  Problem Relation Age of Onset  . Heart disease Father    Social History   Socioeconomic History  . Marital status: Single    Spouse name: Not on file  . Number of children: 0  . Years of education: Not on file  . Highest education level: Not on file  Occupational History  . Not on file  Social Needs  . Financial resource strain: Not on file   . Food insecurity:    Worry: Not on file    Inability: Not on file  . Transportation needs:    Medical: Not on file    Non-medical: Not on file  Tobacco Use  . Smoking status: Former Research scientist (life sciences)  . Smokeless tobacco: Never Used  Substance and Sexual Activity  . Alcohol use: No    Frequency: Never  . Drug use: No  . Sexual activity: Not Currently  Lifestyle  . Physical activity:    Days per week: Not on file    Minutes per session: Not on file  . Stress: Not on file  Relationships  . Social connections:    Talks on phone: Not on file    Gets together: Not on file    Attends religious service: Not on file    Active member of club or organization: Not on file    Attends meetings of clubs or organizations: Not on file    Relationship status: Not on file  . Intimate partner violence:    Fear of current or ex partner: Not on file    Emotionally abused: Not on file    Physically abused: Not  on file    Forced sexual activity: Not on file  Other Topics Concern  . Not on file  Social History Narrative   Former smoker quit 2013/2014 used to smoke<1 ppd x 2-3 years    No kids   Single not sexually active    Unable to read/write    Has dogs and cats at home    No guns   Wears seat belt    Current Meds  Medication Sig  . albuterol (PROVENTIL HFA;VENTOLIN HFA) 108 (90 BASE) MCG/ACT inhaler Inhale 2 puffs into the lungs every 4 (four) hours as needed. For shortness of breath  . amLODipine (NORVASC) 5 MG tablet Take 5 mg by mouth daily.  Marland Kitchen aspirin 81 MG EC tablet Take 81 mg by mouth daily.   Marland Kitchen atorvastatin (LIPITOR) 20 MG tablet Take 20 mg by mouth daily.  . budesonide-formoterol (SYMBICORT) 80-4.5 MCG/ACT inhaler Inhale 2 puffs into the lungs 2 (two) times daily.  . cetirizine (ZYRTEC) 10 MG tablet Take 10 mg by mouth daily.  . Cholecalciferol 50000 units capsule Take 1 capsule (50,000 Units total) by mouth once a week.  . clopidogrel (PLAVIX) 75 MG tablet Take 75 mg by mouth daily.   . diclofenac (FLECTOR) 1.3 % PTCH Place 1 patch onto the skin 2 (two) times daily.  . DULoxetine (CYMBALTA) 60 MG capsule Take 60 mg by mouth daily.  . furosemide (LASIX) 40 MG tablet Take 40 mg by mouth daily.  Marland Kitchen gabapentin (NEURONTIN) 300 MG capsule Take 300-600 mg by mouth 2 (two) times daily. 300 mg qd and 600 mg qhs  . hydrOXYzine (ATARAX/VISTARIL) 25 MG tablet Take 1 tablet (25 mg total) by mouth 3 (three) times daily as needed.  . isosorbide mononitrate (IMDUR) 30 MG 24 hr tablet Take 30 mg by mouth daily. Per pharmacy taking 20 mg qd  . ketorolac (ACULAR) 0.5 % ophthalmic solution   . metFORMIN (GLUCOPHAGE) 500 MG tablet Take 1 tablet (500 mg total) by mouth daily with breakfast.  . metFORMIN (GLUCOPHAGE) 500 MG tablet Take 1 tablet (500 mg total) by mouth daily with breakfast.  . methylPREDNISolone (MEDROL DOSEPAK) 4 MG TBPK tablet Take Tapered dose as directed  . metoprolol tartrate (LOPRESSOR) 25 MG tablet Take 25 mg by mouth 2 (two) times daily.  . montelukast (SINGULAIR) 10 MG tablet Take 10 mg by mouth at bedtime.  . mupirocin ointment (BACTROBAN) 2 % Apply to affected area 3 times daily  . nitroGLYCERIN (NITROSTAT) 0.4 MG SL tablet Place 0.4 mg under the tongue every 5 (five) minutes as needed. For chest pain  . pantoprazole (PROTONIX) 40 MG tablet Take 40 mg by mouth daily.  . prednisoLONE acetate (PRED FORTE) 1 % ophthalmic suspension   . ranitidine (ZANTAC) 150 MG/10ML syrup 150 mg 2 (two) times daily. Taking 10 ml bid  . Secukinumab (COSENTYX 300 DOSE [place]) Inject into the skin every 30 (thirty) days.  Marland Kitchen spironolactone (ALDACTONE) 25 MG tablet Take 25 mg by mouth daily.  Marland Kitchen triamcinolone cream (KENALOG) 0.1 % Apply 1 application topically 2 (two) times daily. Not face, groin, underarms  . [DISCONTINUED] clindamycin (CLEOCIN) 300 MG capsule Take 1 capsule (300 mg total) by mouth 3 (three) times daily for 10 days.   No Known Allergies Recent Results (from the past 2160 hour(s))   Urinalysis, Complete w Microscopic     Status: Abnormal   Collection Time: 09/05/17 10:50 PM  Result Value Ref Range   Color, Urine YELLOW (A) YELLOW  APPearance CLEAR (A) CLEAR   Specific Gravity, Urine 1.013 1.005 - 1.030   pH 7.0 5.0 - 8.0   Glucose, UA NEGATIVE NEGATIVE mg/dL   Hgb urine dipstick NEGATIVE NEGATIVE   Bilirubin Urine NEGATIVE NEGATIVE   Ketones, ur NEGATIVE NEGATIVE mg/dL   Protein, ur NEGATIVE NEGATIVE mg/dL   Nitrite NEGATIVE NEGATIVE   Leukocytes, UA NEGATIVE NEGATIVE   RBC / HPF 0-5 0 - 5 RBC/hpf   WBC, UA 0-5 0 - 5 WBC/hpf   Bacteria, UA RARE (A) NONE SEEN   Squamous Epithelial / LPF NONE SEEN 0 - 5    Comment: Please note change in reference range.   Mucus PRESENT     Comment: Performed at [facility], [address] [place], [place] [address]  CBC with Differential     Status: Abnormal   Collection Time: 09/06/17  1:35 AM  Result Value Ref Range   WBC 8.6 3.8 - 10.6 K/uL   RBC 4.79 4.40 - 5.90 MIL/uL   Hemoglobin 14.1 13.0 - 18.0 g/dL   HCT 43.4 40.0 - 52.0 %   MCV 90.6 80.0 - 100.0 fL   MCH 29.5 26.0 - 34.0 pg   MCHC 32.5 32.0 - 36.0 g/dL   RDW 18.2 (H) 11.5 - 14.5 %   Platelets 184 150 - 440 K/uL   Neutrophils Relative % 56 %   Neutro Abs 4.8 1.4 - 6.5 K/uL   Lymphocytes Relative 31 %   Lymphs Abs 2.6 1.0 - 3.6 K/uL   Monocytes Relative 9 %   Monocytes Absolute 0.7 0.2 - 1.0 K/uL   Eosinophils Relative 3 %   Eosinophils Absolute 0.3 0 - 0.7 K/uL   Basophils Relative 1 %   Basophils Absolute 0.1 0 - 0.1 K/uL    Comment: Performed at [facility], [address] [place], [place] [address]  Basic metabolic panel     Status: Abnormal   Collection Time: 09/06/17  1:35 AM  Result Value Ref Range   Sodium 139 135 - 145 mmol/L   Potassium 3.5 3.5 - 5.1 mmol/L   Chloride 99 (L) 101 - 111 mmol/L   CO2 33 (H) 22 - 32 mmol/L   Glucose, Bld 122 (H) 65 - 99 mg/dL   BUN 11 6 - 20 mg/dL   Creatinine, Ser 0.86 0.61 - 1.24 mg/dL    Calcium 9.2 8.9 - 10.3 mg/dL   GFR calc non Af Amer >60 >60 mL/min   GFR calc Af Amer >60 >60 mL/min    Comment: (NOTE) The eGFR has been calculated using the CKD EPI equation. This calculation has not been validated in all clinical situations. eGFR's persistently <60 mL/min signify possible Chronic Kidney Disease.    Anion gap 7 5 - 15    Comment: Performed at [facility], [address] [place], [place] [address]  CBC     Status: Abnormal   Collection Time: 09/24/17 11:07 PM  Result Value Ref Range   WBC 10.5 3.8 - 10.6 K/uL   RBC 4.45 4.40 - 5.90 MIL/uL   Hemoglobin 13.6 13.0 - 18.0 g/dL   HCT 40.6 40.0 - 52.0 %   MCV 91.2 80.0 - 100.0 fL   MCH 30.5 26.0 - 34.0 pg   MCHC 33.4 32.0 - 36.0 g/dL   RDW 17.5 (H) 11.5 - 14.5 %   Platelets 186 150 - 440 K/uL    Comment: Performed at [facility], [address] [place],  [place] [address]  Comprehensive metabolic panel     Status: Abnormal   Collection Time: 09/24/17 11:07 PM  Result Value Ref Range   Sodium 141 135 - 145 mmol/L   Potassium 3.3 (L) 3.5 - 5.1 mmol/L   Chloride 104 101 - 111 mmol/L   CO2 30 22 - 32 mmol/L   Glucose, Bld 118 (H) 65 - 99 mg/dL   BUN 12 6 - 20 mg/dL   Creatinine, Ser 0.85 0.61 - 1.24 mg/dL   Calcium 8.7 (L) 8.9 - 10.3 mg/dL   Total Protein 7.3 6.5 - 8.1 g/dL   Albumin 3.8 3.5 - 5.0 g/dL   AST 17 15 - 41 U/L   ALT 16 (L) 17 - 63 U/L   Alkaline Phosphatase 73 38 - 126 U/L   Total Bilirubin 0.5 0.3 - 1.2 mg/dL   GFR calc non Af Amer >60 >60 mL/min   GFR calc Af Amer >60 >60 mL/min    Comment: (NOTE) The eGFR has been calculated using the CKD EPI equation. This calculation has not been validated in all clinical situations. eGFR's persistently <60 mL/min signify possible Chronic Kidney Disease.    Anion gap 7 5 - 15    Comment: Performed at [facility], [address] [place], [place] [address]  Urinalysis, Complete w Microscopic     Status: Abnormal    Collection Time: 09/24/17 11:07 PM  Result Value Ref Range   Color, Urine YELLOW (A) YELLOW   APPearance CLEAR (A) CLEAR   Specific Gravity, Urine 1.023 1.005 - 1.030   pH 6.0 5.0 - 8.0   Glucose, UA NEGATIVE NEGATIVE mg/dL   Hgb urine dipstick NEGATIVE NEGATIVE   Bilirubin Urine NEGATIVE NEGATIVE   Ketones, ur 5 (A) NEGATIVE mg/dL   Protein, ur NEGATIVE NEGATIVE mg/dL   Nitrite NEGATIVE NEGATIVE   Leukocytes, UA NEGATIVE NEGATIVE   RBC / HPF 0-5 0 - 5 RBC/hpf   WBC, UA 0-5 0 - 5 WBC/hpf   Bacteria, UA NONE SEEN NONE SEEN   Squamous Epithelial / LPF 0-5 0 - 5   Mucus PRESENT    Ca Oxalate Crys, UA PRESENT     Comment: Performed at [facility], [address] [place], [place] [address]  Comprehensive metabolic panel     Status: None   Collection Time: 11/15/17  4:47 PM  Result Value Ref Range   Sodium 139 135 - 145 mEq/L   Potassium 3.7 3.5 - 5.1 mEq/L   Chloride 98 96 - 112 mEq/L   CO2 32 19 - 32 mEq/L   Glucose, Bld 80 70 - 99 mg/dL   BUN 14 6 - 23 mg/dL   Creatinine, Ser 0.95 0.40 - 1.50 mg/dL   Total Bilirubin 0.4 0.2 - 1.2 mg/dL   Alkaline Phosphatase 78 39 - 117 U/L   AST 13 0 - 37 U/L   ALT 13 0 - 53 U/L   Total Protein 7.2 6.0 - 8.3 g/dL   Albumin 4.2 3.5 - 5.2 g/dL   Calcium 9.1 8.4 - 10.5 mg/dL   GFR 85.68 >60.00 mL/min  CBC with Differential/Platelet     Status: None   Collection Time: 11/15/17  4:47 PM  Result Value Ref Range   WBC 7.5 4.0 - 10.5 K/uL   RBC 4.29 4.22 - 5.81 Mil/uL   Hemoglobin 13.4 13.0 - 17.0 g/dL   HCT 40.2 39.0 - 52.0 %   MCV 93.6 78.0 - 100.0 fl   MCHC  33.4 30.0 - 36.0 g/dL   RDW 15.4 11.5 - 15.5 %   Platelets 173.0 150.0 - 400.0 K/uL   Neutrophils Relative % 57.7 43.0 - 77.0 %   Lymphocytes Relative 31.3 12.0 - 46.0 %   Monocytes Relative 7.8 3.0 - 12.0 %   Eosinophils Relative 2.0 0.0 - 5.0 %   Basophils Relative 1.2 0.0 - 3.0 %   Neutro Abs 4.3 1.4 - 7.7 K/uL   Lymphs Abs 2.4 0.7 - 4.0 K/uL   Monocytes Absolute 0.6  0.1 - 1.0 K/uL   Eosinophils Absolute 0.2 0.0 - 0.7 K/uL   Basophils Absolute 0.1 0.0 - 0.1 K/uL  Urinalysis, Routine w reflex microscopic     Status: None   Collection Time: 11/15/17  4:47 PM  Result Value Ref Range   Specific Gravity, UA CANCELED     Comment: Quantity was not sufficient for analysis.  Result canceled by the ancillary.    pH, UA CANCELED     Comment: Test not performed  Result canceled by the ancillary.    Protein, UA CANCELED     Comment: Test not performed  Result canceled by the ancillary.    Glucose, UA CANCELED     Comment: Test not performed  Result canceled by the ancillary.    Ketones, UA CANCELED     Comment: Test not performed  Result canceled by the ancillary.   TSH     Status: None   Collection Time: 11/15/17  4:47 PM  Result Value Ref Range   TSH 0.87 0.35 - 4.50 uIU/mL  Hemoglobin A1c     Status: Abnormal   Collection Time: 11/15/17  4:47 PM  Result Value Ref Range   Hgb A1c MFr Bld 6.6 (H) 4.6 - 6.5 %    Comment: Glycemic Control Guidelines for People with Diabetes:Non Diabetic:  <6%Goal of Therapy: <7%Additional Action Suggested:  >8%   PSA, Medicare     Status: None   Collection Time: 11/15/17  4:47 PM  Result Value Ref Range   PSA 0.26 0.10 - 4.00 ng/ml    Comment: Test performed using Access Hybritech PSA Assay, a parmagnetic partical, chemiluminecent immunoassay.  Rheumatoid Factor     Status: None   Collection Time: 11/15/17  4:47 PM  Result Value Ref Range   Rhuematoid fact SerPl-aCnc <14 <14 IU/mL  Sedimentation rate     Status: None   Collection Time: 11/15/17  4:47 PM  Result Value Ref Range   Sed Rate 12 0 - 20 mm/hr  C-reactive protein     Status: None   Collection Time: 11/15/17  4:47 PM  Result Value Ref Range   CRP 1.1 0.5 - 20.0 mg/dL  Antinuclear Antib (ANA)     Status: None   Collection Time: 11/15/17  4:47 PM  Result Value Ref Range   Anti Nuclear Antibody(ANA) NEGATIVE NEGATIVE    Comment: ANA IFA is a  first line screen for detecting the presence of up to approximately 150 autoantibodies in various autoimmune diseases. A negative ANA IFA result suggests ANA-associated autoimmune diseases are not present at this time. . Visit Physician FAQs for interpretation of all antibodies in the [place], prevalence, and association with diseases at http://education.[facility].com/ GGE/ZMO294 .   Cyclic citrul peptide antibody, IgG     Status: None   Collection Time: 11/15/17  4:47 PM  Result Value Ref Range   Cyclic Citrullin Peptide Ab <16 UNITS    Comment: Reference Range Negative:            <  20 Weak Positive:       20-39 Moderate Positive:   40-59 Strong Positive:     >59 .   Hepatitis C antibody     Status: None   Collection Time: 11/15/17  4:47 PM  Result Value Ref Range   Hepatitis C Ab NON-REACTIVE NON-REACTI   SIGNAL TO CUT-OFF 0.02 <1.00    Comment: . HCV antibody was non-reactive. There is no laboratory  evidence of HCV infection. . In most cases, no further action is required. However, if recent HCV exposure is suspected, a test for HCV RNA (test code [phone]) is suggested. . For additional information please refer to http://education.[facility].com/faq/FAQ22v1 (This link is being provided for informational/ educational purposes only.) .   Measles/Mumps/Rubella Immunity     Status: None   Collection Time: 11/15/17  4:47 PM  Result Value Ref Range   [place] IgG 208.00 AU/mL    Comment: AU/mL            Interpretation -----            -------------- <25.00           Negative 25.00-29.99      Equivocal >29.99           Positive . A positive result indicates that the patient has antibody to measles virus. It does not differentiate  between an active or past infection. The clinical  diagnosis must be interpreted in conjunction with  clinical signs and symptoms of the patient.    Mumps IgG >300.00 AU/mL    Comment:  AU/mL            Interpretation -------         ---------------- <9.00             Negative 9.00-10.99        Equivocal >10.99            Positive A positive result indicates that the patient has  antibody to mumps virus. It does not differentiate between an  active or past infection. The clinical diagnosis must be interpreted in conjunction with clinical signs and symptoms of the patient. .    Rubella 14.80 index    Comment:     Index            Interpretation     -----            --------------       <0.90            Not consistent with Immunity     0.90-0.99        Equivocal     > or = 1.00      Consistent with Immunity  . The presence of rubella IgG antibody suggests  immunization or past or current infection with rubella virus.   Hepatitis B surface antigen     Status: None   Collection Time: 11/15/17  4:47 PM  Result Value Ref Range   Hepatitis B Surface Ag NON-REACTIVE NON-REACTI  Hepatitis B surface antibody     Status: Abnormal   Collection Time: 11/15/17  4:47 PM  Result Value Ref Range   Hepatitis B-Post <5 (L) > OR = 10 mIU/mL    Comment: Verified by repeat analysis. . . Patient does not have immunity to hepatitis B virus. . For additional information, please refer to http://education.[facility].com/faq/FAQ105 (This link is being provided for informational/ educational purposes only).   TEST AUTHORIZATION     Status: None   Collection  Time: 11/15/17  4:47 PM  Result Value Ref Range   TEST NAME: HEPATITIS B SURFACE MMR (IGG)    TEST CODE: 498XLL3 [id number]    CLIENT CONTACT: LATOYA WRIGHT    REPORT ALWAYS MESSAGE SIGNATURE      Comment: . The laboratory testing on this patient was verbally requested or confirmed by the ordering physician or his or her authorized representative after contact with an employee of [employer]. Federal regulations require that we maintain on file written authorization for all laboratory testing.  Accordingly we are  asking that the ordering physician or his or her authorized representative sign a copy of this report and promptly return it to the client service representative. . . Signature:____________________________________________________ . Please fax this signed page to [phone] or return it via your [employer] courier.   Vitamin D (25 hydroxy)     Status: Abnormal   Collection Time: 11/16/17  8:22 AM  Result Value Ref Range   VITD 11.36 (L) 30.00 - 100.00 ng/mL   Objective  Body mass index is 35.3 kg/m. Wt Readings from Last 3 Encounters:  12/04/17 225 lb 6.4 oz (102.2 kg)  11/28/17 231 lb (104.8 kg)  11/18/17 231 lb (104.8 kg)   Temp Readings from Last 3 Encounters:  12/04/17 98 F (36.7 C) (Oral)  11/28/17 98.6 F (37 C) (Oral)  11/18/17 98.8 F (37.1 C) (Oral)   BP Readings from Last 3 Encounters:  12/04/17 134/70  11/28/17 (!) 144/73  11/18/17 136/74   Pulse Readings from Last 3 Encounters:  12/04/17 88  11/28/17 89  11/18/17 67    Physical Exam  Constitutional: He is oriented to person, place, and time. Vital signs are normal. He appears well-developed and well-nourished. He is cooperative.  HENT:  Head: Normocephalic and atraumatic.  Mouth/Throat: Oropharynx is clear and moist and mucous membranes are normal.  Eyes: Pupils are equal, round, and reactive to light. Conjunctivae are normal.  Cardiovascular: Normal rate, regular rhythm and normal heart sounds.  Pulmonary/Chest: Effort normal and breath sounds normal.  Neurological: He is alert and oriented to person, place, and time. Gait normal.  Open wounds to trunk extremities   Skin: Skin is warm, dry and intact.  L check lesion ? Cyst vs other   Psychiatric: He has a normal mood and affect. His speech is normal and behavior is normal. Judgment and thought content normal. Cognition and memory are normal.  Nursing note and vitals reviewed.   Assessment   1. Left cyst lesion/cellulitis, open wounds  need for tbse  2. B/l knee OA R>L 3. HTN  4. DM 2 A1C 6.6  5. HM  Plan  1.  Try to move derm appt up from 01/2018  2. Refer Dr. Marry Guan  3. Cont meds  4. Metformin 500 mg qd  Urine check and lipid today  pna 23 x 1 today  5.  Had flu shot per pt  Tdap had 06/20/15 or 12/07/15 per prior PCP notes Adacel also had 08/18/14, 05/13/15, 12/07/15 pna 23 had 09/19/12  Check with PCP other vaccines I.e shingrix and colonoscopy, labs,notes I.e cardiology notes if had  rec hep B vaccine at f/u  hep C (negative), Immune MMR, and vitamin D (low) Will need to do lipid and urine protein today  Former smoker quit 2013/2014 smoked <1 ppd x  2-3 years  Urine had 09/24/17 normal  H/o CT scan 10/2015 with 10 mm area on right kidney and [place] rec in 3 months  Reviewed records [facility] in [place] -had colonoscopy 2017 or 2018 per chart with Dr. Martinique with pre pyloric ulcer  -was on norvasc 5 mg, lasix 40, metoprolol ER 25 mg qd, singulair 10  -eye exam had 04/05/17 Dr. Thomasenia Bottoms significant cataract OD affecting ADL had right eye cataract surgery  -h/o elevated lfts in 7 or 12/2016  -hep A Ab igM neg, HBsAg neg, Hep b core Ab igM neg and hcv ab neg 11/20/16  HIV neg 09/15/14  Uric acid 8.1 06/11/14, ANA neg 06/11/14, RA 8.4 nl, CRP 26.4 H ESR 20.0 H 10/08/13  Vitamin B12 323 10/08/13  -chronic knee pain s/p multiple joint injections I.[place]  prior PCP [facility]      Provider: Dr. Olivia Mackie McLean-Scocuzza-Internal Medicine

## 2017-12-04 NOTE — Telephone Encounter (Signed)
He has not been scheduled yet with [facility]

## 2017-12-04 NOTE — Progress Notes (Signed)
Pre visit review using our clinic review tool, if applicable. No additional management support is needed unless otherwise documented below in the visit note. 

## 2017-12-04 NOTE — Telephone Encounter (Signed)
Ok pls sch  [place] Part

## 2017-12-05 ENCOUNTER — Telehealth: Payer: Self-pay | Admitting: Internal Medicine

## 2017-12-05 NOTE — Telephone Encounter (Signed)
Pt called to check on vit D RX. I advised that it was sent to Mail order and pt states that is fine.

## 2017-12-05 NOTE — Telephone Encounter (Signed)
I sent to mail order  Please call pt to advise unless he wants it to go locally?   Let me know   [place]

## 2017-12-05 NOTE — Telephone Encounter (Signed)
I tried to find this in the [place] note to handle but I could not find vitamins except D  Which appears it gets through mail order are there OTC vitamins you wanted patient to take?

## 2017-12-05 NOTE — Telephone Encounter (Unsigned)
Copied from [place] [id number]. Topic: General - Other >> Dec 05, 2017  4:41 PM Mcneil, Ja-Kwan wrote: Reason for CRM: Pt returned call for lab results. Pt requests call back. Cb# [phone]

## 2017-12-06 ENCOUNTER — Telehealth: Payer: Self-pay | Admitting: Internal Medicine

## 2017-12-06 NOTE — Telephone Encounter (Signed)
Copied from [place] [id number]. Topic: General - Other >> Dec 06, 2017  2:33 PM Carolyn Stare wrote:  Pt said she below RX was suppose to be called in and [facility] and they dont have it , he is asking for it to be called in to [address]  Cholecalciferol 50000 units capsule        he also said there was suppose to be a increased in one of his meds but he dont know which one and he would also like something for pain      [place]

## 2017-12-06 NOTE — Telephone Encounter (Signed)
Pt returned call, he has already received his results. Pt inquired about his medication being sent in to pharmacy.    Please assist.

## 2017-12-06 NOTE — Telephone Encounter (Signed)
Attempted to call patient mailbox full

## 2017-12-07 ENCOUNTER — Other Ambulatory Visit: Payer: Self-pay

## 2017-12-07 DIAGNOSIS — E559 Vitamin D deficiency, unspecified: Secondary | ICD-10-CM

## 2017-12-07 MED ORDER — CHOLECALCIFEROL 1.25 MG (50000 UT) PO CAPS: 50000.0000 [IU] | ORAL_CAPSULE | ORAL | 1 refills | Status: DC

## 2017-12-10 ENCOUNTER — Other Ambulatory Visit: Payer: Self-pay | Admitting: Internal Medicine

## 2017-12-10 ENCOUNTER — Ambulatory Visit: Payer: Medicare Other | Admitting: Internal Medicine

## 2017-12-10 DIAGNOSIS — E559 Vitamin D deficiency, unspecified: Secondary | ICD-10-CM

## 2017-12-10 MED ORDER — CHOLECALCIFEROL 1.25 MG (50000 UT) PO CAPS: 50000.0000 [IU] | ORAL_CAPSULE | ORAL | 1 refills | Status: AC

## 2017-12-11 ENCOUNTER — Encounter: Payer: Self-pay | Admitting: Internal Medicine

## 2017-12-11 DIAGNOSIS — R4189 Other symptoms and signs involving cognitive functions and awareness: Secondary | ICD-10-CM | POA: Insufficient documentation

## 2017-12-11 DIAGNOSIS — M25562 Pain in left knee: Secondary | ICD-10-CM | POA: Diagnosis not present

## 2017-12-11 DIAGNOSIS — M25561 Pain in right knee: Secondary | ICD-10-CM | POA: Diagnosis not present

## 2017-12-11 DIAGNOSIS — M1711 Unilateral primary osteoarthritis, right knee: Secondary | ICD-10-CM | POA: Diagnosis not present

## 2017-12-11 DIAGNOSIS — G8929 Other chronic pain: Secondary | ICD-10-CM | POA: Diagnosis not present

## 2017-12-11 DIAGNOSIS — M1712 Unilateral primary osteoarthritis, left knee: Secondary | ICD-10-CM | POA: Diagnosis not present

## 2017-12-12 NOTE — Telephone Encounter (Signed)
Not sure need more details and vitamin D is once a week we sent to his pharmacy  As far as pain referred to orthopedics  He can take tylenol 500 mg up to 6 pills in 1 day otc  If needs long term pain meds refer to pain clinic in future   [place]

## 2017-12-12 NOTE — Telephone Encounter (Signed)
Pt called to check into the medicine that was suppose to be increased in dosage as well as a pain medicine; pt does not know any names of the medications; contact to advise

## 2017-12-13 ENCOUNTER — Other Ambulatory Visit: Payer: Self-pay | Admitting: Internal Medicine

## 2017-12-13 DIAGNOSIS — M25561 Pain in right knee: Principal | ICD-10-CM

## 2017-12-13 DIAGNOSIS — M25562 Pain in left knee: Principal | ICD-10-CM

## 2017-12-13 DIAGNOSIS — G8929 Other chronic pain: Secondary | ICD-10-CM

## 2017-12-13 DIAGNOSIS — M2550 Pain in unspecified joint: Secondary | ICD-10-CM

## 2017-12-13 NOTE — Telephone Encounter (Signed)
Patient is calling and states he would like to be referred to a pain clinic. Please advise. [phone]

## 2017-12-16 ENCOUNTER — Encounter: Payer: Self-pay | Admitting: Internal Medicine

## 2017-12-17 ENCOUNTER — Encounter: Payer: Self-pay | Admitting: *Deleted

## 2017-12-17 ENCOUNTER — Other Ambulatory Visit: Payer: Self-pay

## 2017-12-17 ENCOUNTER — Emergency Department
Admission: EM | Admit: 2017-12-17 | Discharge: 2017-12-17 | Disposition: A | Discharge status: Home / Self Care | Payer: Medicare HMO | Attending: Emergency Medicine | Admitting: Emergency Medicine

## 2017-12-17 DIAGNOSIS — Y999 Unspecified external cause status: Secondary | ICD-10-CM | POA: Diagnosis not present

## 2017-12-17 DIAGNOSIS — Z87891 Personal history of nicotine dependence: Secondary | ICD-10-CM | POA: Diagnosis not present

## 2017-12-17 DIAGNOSIS — S60459A Superficial foreign body of unspecified finger, initial encounter: Secondary | ICD-10-CM

## 2017-12-17 DIAGNOSIS — S60456A Superficial foreign body of right little finger, initial encounter: Secondary | ICD-10-CM | POA: Insufficient documentation

## 2017-12-17 DIAGNOSIS — W25XXXA Contact with sharp glass, initial encounter: Secondary | ICD-10-CM | POA: Insufficient documentation

## 2017-12-17 DIAGNOSIS — S60453A Superficial foreign body of left middle finger, initial encounter: Secondary | ICD-10-CM | POA: Diagnosis not present

## 2017-12-17 DIAGNOSIS — Z7982 Long term (current) use of aspirin: Secondary | ICD-10-CM | POA: Insufficient documentation

## 2017-12-17 DIAGNOSIS — Z955 Presence of coronary angioplasty implant and graft: Secondary | ICD-10-CM | POA: Insufficient documentation

## 2017-12-17 DIAGNOSIS — I10 Essential (primary) hypertension: Secondary | ICD-10-CM | POA: Diagnosis not present

## 2017-12-17 DIAGNOSIS — J449 Chronic obstructive pulmonary disease, unspecified: Secondary | ICD-10-CM | POA: Diagnosis not present

## 2017-12-17 DIAGNOSIS — Z7984 Long term (current) use of oral hypoglycemic drugs: Secondary | ICD-10-CM | POA: Insufficient documentation

## 2017-12-17 DIAGNOSIS — I2510 Atherosclerotic heart disease of native coronary artery without angina pectoris: Secondary | ICD-10-CM | POA: Diagnosis not present

## 2017-12-17 DIAGNOSIS — Y9389 Activity, other specified: Secondary | ICD-10-CM | POA: Diagnosis not present

## 2017-12-17 DIAGNOSIS — Y929 Unspecified place or not applicable: Secondary | ICD-10-CM | POA: Diagnosis not present

## 2017-12-17 DIAGNOSIS — E119 Type 2 diabetes mellitus without complications: Secondary | ICD-10-CM | POA: Insufficient documentation

## 2017-12-17 DIAGNOSIS — Z79899 Other long term (current) drug therapy: Secondary | ICD-10-CM | POA: Insufficient documentation

## 2017-12-17 NOTE — ED Notes (Signed)
Pt states he was getting cans out of the dumpster when he got a piece of glass stuck in his left middle finger. He states that its' painful.

## 2017-12-17 NOTE — ED Provider Notes (Signed)
[facility] Emergency Department Provider Note ____________________________________________  Time seen: 2019  I have reviewed the triage vital signs and the nursing notes.  HISTORY  Chief Complaint  Foreign Body  HPI Nicholas Reyes is a 62 y.o. male presents himself to the ED for evaluation of a glass entered to the middle finger.  Patient reports he was climbing & looking into dumpsters acting alone cans, when he excellently got a small piece of glass to the tip of his middle finger.  He presents now with acute pain to the fingertip with a foreign body sensation.  He has not attempted to remove the foreign body.  Past Medical History:  Diagnosis Date  . Arthritis   . CAD (coronary artery disease)   . COPD (chronic obstructive pulmonary disease) ([place])   . Depression   . Diabetes mellitus   . Heart disease    cad with 3 stents   . Hypertension   . Migraines   . Psoriasis    joint pain in knees left hand     Patient Active Problem List   Diagnosis Date Noted  . Cognitive impairment 12/11/2017  . DM2 (diabetes mellitus, type 2) ([place]) 12/04/2017  . HTN (hypertension) 11/15/2017  . CAD (coronary artery disease) 11/15/2017  . Psoriasis 11/15/2017  . Skin sore 11/15/2017  . COPD (chronic obstructive pulmonary disease) ([place]) 11/15/2017  . Diabetes mellitus without complication ([place]) 96/28/3662  . Vitamin D deficiency 11/15/2017  . Osteoarthritis 11/15/2017    Past Surgical History:  Procedure Laterality Date  . APPENDECTOMY    . CATARACT EXTRACTION Bilateral    inserted lens b/l eyes right 03/12/17, left 05/28/17   . CHOLECYSTECTOMY    . CORONARY ANGIOPLASTY WITH STENT PLACEMENT     LAD, LCX-OM, ? 3rd location  . EYE SURGERY     cataract b/l   . HEMORRHOID SURGERY    . hemorrhoid surgery      x 2   . KNEE SURGERY     right knee arthroscopy, partial synovectecomy 04/21/15 and meniscectomy/chondroplasty   . right knee surgery     ? type of  procedure   . STENT PLACEMENT VASCULAR ([place] HX)    . TONSILLECTOMY    . TONSILLECTOMY AND ADENOIDECTOMY      Prior to Admission medications   Medication Sig Start Date End Date Taking? Authorizing Provider  albuterol (PROVENTIL HFA;VENTOLIN HFA) 108 (90 BASE) MCG/ACT inhaler Inhale 2 puffs into the lungs every 4 (four) hours as needed. For shortness of breath    [provider]  amLODipine (NORVASC) 5 MG tablet Take 5 mg by mouth daily.    [provider]  aspirin 81 MG EC tablet Take 81 mg by mouth daily.     [provider]  atorvastatin (LIPITOR) 20 MG tablet Take 20 mg by mouth daily.    [provider]  budesonide-formoterol (SYMBICORT) 80-4.5 MCG/ACT inhaler Inhale 2 puffs into the lungs 2 (two) times daily.    [provider]  cetirizine (ZYRTEC) 10 MG tablet Take 10 mg by mouth daily.    [provider]  Cholecalciferol 50000 units capsule Take 1 capsule (50,000 Units total) by mouth once a week. 12/10/17   McLean-Scocuzza, Nino Glow, MD  clopidogrel (PLAVIX) 75 MG tablet Take 75 mg by mouth daily.    [provider]  diclofenac (FLECTOR) 1.3 % PTCH Place 1 patch onto the skin 2 (two) times daily. 11/30/17   McLean-Scocuzza, Nino Glow, MD  doxycycline (VIBRA-TABS) 100 MG tablet Take 1 tablet (100 mg total) by mouth 2 (two) times daily. With food 12/04/17   McLean-Scocuzza, Nino Glow, MD  DULoxetine (CYMBALTA) 60 MG capsule Take 60 mg by mouth daily.    [provider]  furosemide (LASIX) 40 MG tablet Take 40 mg by mouth daily.    [provider]  gabapentin (NEURONTIN) 300 MG capsule Take 300-600 mg by mouth 2 (two) times daily. 300 mg qd and 600 mg qhs    [provider]  hydrOXYzine (ATARAX/VISTARIL) 25 MG tablet Take 1 tablet (25 mg total) by mouth 3 (three) times daily as needed. 11/28/17   Triplett, Johnette Abraham B, FNP  isosorbide mononitrate (IMDUR) 30 MG 24 hr tablet Take 30 mg by mouth daily. Per pharmacy  taking 20 mg qd    [provider]  ketorolac (ACULAR) 0.5 % ophthalmic solution  12/03/17   [provider]  metFORMIN (GLUCOPHAGE) 500 MG tablet Take 1 tablet (500 mg total) by mouth daily with breakfast. 11/23/17   McLean-Scocuzza, Nino Glow, MD  metFORMIN (GLUCOPHAGE) 500 MG tablet Take 1 tablet (500 mg total) by mouth daily with breakfast. 11/23/17   McLean-Scocuzza, Nino Glow, MD  methylPREDNISolone (MEDROL DOSEPAK) 4 MG TBPK tablet Take Tapered dose as directed 11/18/17   Sable Feil, PA-C  metoprolol tartrate (LOPRESSOR) 25 MG tablet Take 25 mg by mouth 2 (two) times daily.    [provider]  montelukast (SINGULAIR) 10 MG tablet Take 10 mg by mouth at bedtime.    [provider]  mupirocin ointment (BACTROBAN) 2 % Apply to affected area 3 times daily 11/18/17 11/18/18  Sable Feil, PA-C  nitroGLYCERIN (NITROSTAT) 0.4 MG SL tablet Place 0.4 mg under the tongue every 5 (five) minutes as needed. For chest pain    [provider]  pantoprazole (PROTONIX) 40 MG tablet Take 40 mg by mouth daily.    [provider]  prednisoLONE acetate (PRED FORTE) 1 % ophthalmic suspension  11/30/17   [provider]  ranitidine (ZANTAC) 150 MG/10ML syrup 150 mg 2 (two) times daily. Taking 10 ml bid    [provider]  Secukinumab (COSENTYX 300 DOSE [place]) Inject into the skin every 30 (thirty) days.    [provider]  spironolactone (ALDACTONE) 25 MG tablet Take 25 mg by mouth daily.    [provider]  triamcinolone cream (KENALOG) 0.1 % Apply 1 application topically 2 (two) times daily. Not face, groin, underarms 11/15/17   McLean-Scocuzza, Nino Glow, MD    Allergies Patient has no known allergies.  Family History  Problem Relation Age of Onset  . Cancer Mother   . Heart disease Father   . Stroke Father     Social History Social History   Tobacco Use  . Smoking status: Former Research scientist (life sciences)  . Smokeless tobacco: Never  Used  Substance Use Topics  . Alcohol use: No    Frequency: Never  . Drug use: No    Review of Systems  Constitutional: Negative for fever. Cardiovascular: Negative for chest pain. Respiratory: Negative for shortness of breath. Musculoskeletal: Negative for back pain. Skin: Negative for rash.  Left middle finger foreign body sensation as above. Neurological: Negative for headaches, focal weakness or numbness. ____________________________________________  PHYSICAL EXAM:  VITAL SIGNS: ED Triage Vitals  Enc Vitals Group     BP 12/17/17 1955 123/85     Pulse Rate 12/17/17 1955 80     Resp 12/17/17 1955 18  Temp 12/17/17 1955 98.4 F (36.9 C)     Temp Source 12/17/17 1955 Oral     SpO2 12/17/17 1955 95 %     Weight 12/17/17 1956 225 lb (102.1 kg)     Height 12/17/17 1956 5\' 7"  (1.702 m)     Head Circumference --      Peak Flow --      Pain Score 12/17/17 1956 8     Pain Loc --      Pain Edu? --      Excl. in [place]? --     Constitutional: Alert and oriented. Well appearing and in no distress. Head: Normocephalic and atraumatic. Cardiovascular: Normal rate, regular rhythm. Normal distal pulses. Respiratory: Normal respiratory effort.  Musculoskeletal: Nontender with normal range of motion in all extremities.  Neurologic:  Normal gait without ataxia. Normal speech and language. No gross focal neurologic deficits are appreciated. Skin:  Skin is warm, dry and intact. No rash noted.  Middle finger with a small superficial laceration noted.  Patient exquisitely tender light touch of the same area. Psychiatric: Mood and affect are normal. Patient exhibits appropriate insight and judgment. ____________________________________________  PROCEDURES  .Foreign Body Removal Date/Time: 12/17/2017 10:53 PM Performed by: Melvenia Needles, PA-C Authorized by: Melvenia Needles, PA-C  Consent: Verbal consent obtained. Written consent not obtained. Consent given by:  patient Patient understanding: patient states understanding of the procedure being performed Patient identity confirmed: verbally with patient Body area: skin General location: upper extremity Location details: left long finger  Sedation: Patient sedated: no  Patient restrained: no Patient cooperative: yes Complexity: simple 1 objects recovered. Objects recovered: small glass shard Post-procedure assessment: foreign body removed (using an 18G needle after alcohol swab to site. ) Patient tolerance: Patient tolerated the procedure well with no immediate complications  ____________________________________________  INITIAL IMPRESSION / ASSESSMENT AND PLAN / ED COURSE  Patient with ED evaluation of an accidental foreign body to the left middle finger.  Patient with a small glass splinter to the middle finger superficially.  Foreign bodies removed without difficulty.  Patient's wound is dressed with a disposable bandage.  He is advised to keep the wound clean, dry, and covered. ____________________________________________  FINAL CLINICAL IMPRESSION(S) / ED DIAGNOSES  Final diagnoses:  Injury from broken glass, initial encounter  Foreign body in skin of finger, initial encounter      Melvenia Needles, PA-C 12/17/17 2255    Earleen Newport, MD 12/17/17 [phone]

## 2017-12-17 NOTE — Discharge Instructions (Addendum)
Keep the wound clean, dry, and covered with a Band-aid

## 2017-12-17 NOTE — ED Triage Notes (Signed)
Pt reports glass in left middle finger today.  Pt was looking in a dumpster today.

## 2017-12-18 ENCOUNTER — Telehealth: Payer: Self-pay | Admitting: *Deleted

## 2017-12-19 ENCOUNTER — Ambulatory Visit (INDEPENDENT_AMBULATORY_CARE_PROVIDER_SITE_OTHER): Payer: Medicare HMO | Admitting: Family Medicine

## 2017-12-19 ENCOUNTER — Encounter: Payer: Self-pay | Admitting: Family Medicine

## 2017-12-19 VITALS — BP 128/80 | HR 88 | Temp 98.2°F | Ht 67.0 in | Wt 233.0 lb

## 2017-12-19 DIAGNOSIS — R197 Diarrhea, unspecified: Secondary | ICD-10-CM | POA: Diagnosis not present

## 2017-12-19 DIAGNOSIS — R110 Nausea: Secondary | ICD-10-CM | POA: Diagnosis not present

## 2017-12-19 MED ORDER — ONDANSETRON HCL 4 MG PO TABS: 4.0000 mg | ORAL_TABLET | Freq: Three times a day (TID) | ORAL | 0 refills | Status: AC | PRN

## 2017-12-19 MED ORDER — LOPERAMIDE HCL 2 MG PO TABS
2.0000 mg | ORAL_TABLET | Freq: Four times a day (QID) | ORAL | 0 refills | Status: DC | PRN
Start: 2017-12-19 — End: 2018-01-02

## 2017-12-19 NOTE — Patient Instructions (Signed)
Use imodium as needed for diarrhea. Use zofran as needed for nausea  Over next 2-3 days eat bland foods, drink clear liquids -- water, gatorade, chicken or beef broth, plain toast, white rice, ramen noodles.  May slowly advance back to normal diet as tolerated.

## 2017-12-19 NOTE — Progress Notes (Signed)
Subjective:    Patient ID: Nicholas Reyes, male    DOB: 1956/04/27, 62 y.o.   MRN: [id number]  HPI   Patient presents to clinic for follow-up after emergency room visit.  He went to ER due to getting piece of glass accidentally stuck in the middle finger of left hand.  Patient glass was successfully removed in the ER.  Patient has had no issues since. Last tetanus 2017.   Patient also reports diarrhea for the past 2 days.  States he ate some chicken nuggets yesterday and had episode of diarrhea.  He has not taken anything over-the-counter to see if this helps diarrhea symptoms denies fever or chills.  Reports mild nausea, but no vomiting.  Denies any issues with urination.   CMP Latest Ref Rng & Units 11/15/2017 09/24/2017 09/06/2017  Glucose 70 - 99 mg/dL 80 118(H) 122(H)  BUN 6 - 23 mg/dL 14 12 11   Creatinine 0.40 - 1.50 mg/dL 0.95 0.85 0.86  Sodium 135 - 145 mEq/L 139 141 139  Potassium 3.5 - 5.1 mEq/L 3.7 3.3(L) 3.5  Chloride 96 - 112 mEq/L 98 104 99(L)  CO2 19 - 32 mEq/L 32 30 33(H)  Calcium 8.4 - 10.5 mg/dL 9.1 8.7(L) 9.2  Total Protein 6.0 - 8.3 g/dL 7.2 7.3 -  Total Bilirubin 0.2 - 1.2 mg/dL 0.4 0.5 -  Alkaline Phos 39 - 117 U/L 78 73 -  AST 0 - 37 U/L 13 17 -  ALT 0 - 53 U/L 13 16(L) -    Patient Active Problem List   Diagnosis Date Noted  . Cognitive impairment 12/11/2017  . DM2 (diabetes mellitus, type 2) ([place]) 12/04/2017  . HTN (hypertension) 11/15/2017  . CAD (coronary artery disease) 11/15/2017  . Psoriasis 11/15/2017  . Skin sore 11/15/2017  . COPD (chronic obstructive pulmonary disease) ([place]) 11/15/2017  . Diabetes mellitus without complication ([place]) 46/96/2952  . Vitamin D deficiency 11/15/2017  . Osteoarthritis 11/15/2017   Social History   Tobacco Use  . Smoking status: Former Research scientist (life sciences)  . Smokeless tobacco: Never Used  Substance Use Topics  . Alcohol use: No    Frequency: Never   Review of Systems   Constitutional: Negative for chills, fatigue and  fever.  HENT: Negative.   Eyes: Negative.   Respiratory: Negative for cough, shortness of breath and wheezing.   Cardiovascular: Negative for chest pain, palpitations and leg swelling.  Gastrointestinal: Positve for mild abdominal pain, diarrhea, nausea. No vomiting.  Genitourinary: Negative for dysuria, frequency and urgency.  Musculoskeletal: Negative for arthralgias and myalgias.  Skin: Negative for color change, pallor and rash. Small lac on left middle finger from glass. Neurological: Negative for syncope, light-headedness and headaches.  Psychiatric/Behavioral: The patient is not nervous/anxious.    Objective:   Physical Exam   Constitutional: He is oriented to person, place, and time. He appears well-developed and well-nourished. No distress.  HENT:  Head: Normocephalic and atraumatic.  Eyes: EOM are normal. No scleral icterus.  Cardiovascular: Normal rate, regular rhythm and normal heart sounds.  Pulmonary/Chest: Effort normal and breath sounds normal. No respiratory distress. He has no wheezes. He has no rales.  Abdominal: Obese ABD. Soft. Bowel sounds are normal. There mild diffuse tenderness, no guarding or rebound.  Neurological: He is alert and oriented to person, place, and time.  Gait normal  Skin: Skin is warm and dry. He is not diaphoretic. No pallor.  Psychiatric: He has a normal mood and affect. His behavior is normal.  Nursing note and vitals reviewed.   Vitals:   12/19/17 1019  BP: 128/80  Pulse: 88  Temp: 98.2 F (36.8 C)  SpO2: 92%      Assessment & Plan:   Glass in hand- glass was removed successfully from hand ER.  Still has a small cut in the skin, but area that is scabbed over and healing well.  No signs of infection.  Diarrhea- patient will use Imodium as needed.  Advised to clear liquids including water, diet ginger ale, diet Gatorade, chicken broth and bland fluids such as toast, white rice, crackers and then slowly advance diet as tolerated  screening.  Nausea- patient given a prescription of Zofran to use as needed if nausea occurs.  Currently is not feeling nauseous.  Follow-up if symptoms persist or worsen.  Keep regularly scheduled follow-up with cardiology that is upcoming on August 22.

## 2017-12-24 ENCOUNTER — Ambulatory Visit: Payer: Self-pay | Admitting: *Deleted

## 2017-12-24 NOTE — Telephone Encounter (Signed)
Called patient to see if he was still having chest pain, he states that he is but he can't go to the hospital because he has no one to feed his dog or cat and he doesn't know how he will get home if he goes. I informed him that it is important that he goes to the ED to be seen. Patient states he will go.

## 2017-12-24 NOTE — Telephone Encounter (Signed)
Pt initially calling to request pain medication for knees. During call to agent pt reported police came to his house for a wellness check; pt's sister was concerned as she could not reach him. Pt states police called EMS when he c/o chest pain. EMS arrived, pt refused to go to ED due to "I have no one to feed my cat and dog."   Pt triaged... Denies any further chest pain however reports severe pain both knees and hands. States he has tried   "A lot of things, nothing helps."  Pt is difficult to understand verbally. Pt sees Dr. Avon Gully ; suggested pt call him and I would make PCP aware. Instructed pt to call 911 if chest pain reoccurs AND go to ED as EMS recommended.   Reason for Disposition . [1] SEVERE pain (e.g., excruciating, unable to walk) AND [2] not improved after 2 hours of pain medicine    Chronic  Answer Assessment - Initial Assessment Questions 1. LOCATION and RADIATION: "Where is the pain located?"      Both knees and hands 2. QUALITY: "What does the pain feel like?"  (e.g., sharp, dull, aching, burning)      3. SEVERITY: "How bad is the pain?" "What does it keep you from doing?"   (Scale 1-10; or mild, moderate, severe)   -  MILD (1-3): doesn't interfere with normal activities    -  MODERATE (4-7): interferes with normal activities (e.g., work or school) or awakens from sleep, limping    -  SEVERE (8-10): excruciating pain, unable to do any normal activities, unable to walk     severe 4. ONSET: "When did the pain start?" "Does it come and go, or is it there all the time?"      5. RECURRENT: "Have you had this pain before?" If so, ask: "When, and what happened then?"      6. SETTING: "Has there been any recent work, exercise or other activity that involved that part of the body?"       7. AGGRAVATING FACTORS: "What makes the knee pain worse?" (e.g., walking, climbing stairs, running)      8. ASSOCIATED SYMPTOMS: "Is there any swelling or redness of the knee?"      9. OTHER  SYMPTOMS: "Do you have any other symptoms?" (e.g., chest pain, difficulty breathing, fever, calf pain)   Chest pain this am  Protocols used: KNEE PAIN-A-AH

## 2017-12-26 NOTE — Progress Notes (Deleted)
Cardiology Office Note  Date:  12/26/2017   ID:  Nicholas Reyes, Nicholas Reyes 11-Oct-1955, MRN [id number]  PCP:  McLean-Scocuzza, Nino Glow, MD   No chief complaint on file.   HPI:  Nicholas Reyes is a 62 yo male with PMH of  MI/CAD s/p stent x 3 Osteoarthritis DM II HTN Former smoker, Quit date: 04/11/2014  [facility] Who presents by referral from dr. Olivia Mackie Mclean-Scocuzza for  consultation of his CAD   Hx of LAD stenting  - Stenting of the LCX-OM (02/2016) Cardiac Cath (02/25/16): LVEDP 12 mmHg, LVEF 50%, left main is 0, LAD is 20 - prior stents patent, LCX is subtotal occlusion, stenting of the LCX OM.   Echo (02/2016): LVEF 96%, grade 2 diastolic dysfunction.   Total chol 178 LDL 114 on 12/04/2017  PMH:   has a past medical history of Arthritis, CAD (coronary artery disease), COPD (chronic obstructive pulmonary disease) ([place]), Depression, Diabetes mellitus, Heart disease, Hypertension, Migraines, and Psoriasis.  PSH:    Past Surgical History:  Procedure Laterality Date  . APPENDECTOMY    . CATARACT EXTRACTION Bilateral    inserted lens b/l eyes right 03/12/17, left 05/28/17   . CHOLECYSTECTOMY    . CORONARY ANGIOPLASTY WITH STENT PLACEMENT     LAD, LCX-OM, ? 3rd location  . EYE SURGERY     cataract b/l   . HEMORRHOID SURGERY    . hemorrhoid surgery      x 2   . KNEE SURGERY     right knee arthroscopy, partial synovectecomy 04/21/15 and meniscectomy/chondroplasty   . right knee surgery     ? type of procedure   . STENT PLACEMENT VASCULAR ([place] HX)    . TONSILLECTOMY    . TONSILLECTOMY AND ADENOIDECTOMY      Current Outpatient Medications  Medication Sig Dispense Refill  . albuterol (PROVENTIL HFA;VENTOLIN HFA) 108 (90 BASE) MCG/ACT inhaler Inhale 2 puffs into the lungs every 4 (four) hours as needed. For shortness of breath    . amLODipine (NORVASC) 5 MG tablet Take 5 mg by mouth daily.    Marland Kitchen aspirin 81 MG EC tablet Take 81 mg by mouth daily.     Marland Kitchen atorvastatin (LIPITOR) 20 MG  tablet Take 20 mg by mouth daily.    . budesonide-formoterol (SYMBICORT) 80-4.5 MCG/ACT inhaler Inhale 2 puffs into the lungs 2 (two) times daily.    . cetirizine (ZYRTEC) 10 MG tablet Take 10 mg by mouth daily.    . Cholecalciferol 50000 units capsule Take 1 capsule (50,000 Units total) by mouth once a week. 13 capsule 1  . clopidogrel (PLAVIX) 75 MG tablet Take 75 mg by mouth daily.    . diclofenac (FLECTOR) 1.3 % PTCH Place 1 patch onto the skin 2 (two) times daily. 60 patch 2  . doxycycline (VIBRA-TABS) 100 MG tablet Take 1 tablet (100 mg total) by mouth 2 (two) times daily. With food 14 tablet 0  . DULoxetine (CYMBALTA) 60 MG capsule Take 60 mg by mouth daily.    . furosemide (LASIX) 40 MG tablet Take 40 mg by mouth daily.    Marland Kitchen gabapentin (NEURONTIN) 300 MG capsule Take 300-600 mg by mouth 2 (two) times daily. 300 mg qd and 600 mg qhs    . hydrOXYzine (ATARAX/VISTARIL) 25 MG tablet Take 1 tablet (25 mg total) by mouth 3 (three) times daily as needed. 30 tablet 0  . isosorbide mononitrate (IMDUR) 30 MG 24 hr tablet Take 30 mg by mouth daily. Per  pharmacy taking 20 mg qd    . ketorolac (ACULAR) 0.5 % ophthalmic solution     . loperamide (IMODIUM A-D) 2 MG tablet Take 1 tablet (2 mg total) by mouth 4 (four) times daily as needed for diarrhea or loose stools. 30 tablet 0  . metFORMIN (GLUCOPHAGE) 500 MG tablet Take 1 tablet (500 mg total) by mouth daily with breakfast. 90 tablet 3  . metFORMIN (GLUCOPHAGE) 500 MG tablet Take 1 tablet (500 mg total) by mouth daily with breakfast. 30 tablet 0  . methylPREDNISolone (MEDROL DOSEPAK) 4 MG TBPK tablet Take Tapered dose as directed 21 tablet 0  . metoprolol tartrate (LOPRESSOR) 25 MG tablet Take 25 mg by mouth 2 (two) times daily.    . montelukast (SINGULAIR) 10 MG tablet Take 10 mg by mouth at bedtime.    . mupirocin ointment (BACTROBAN) 2 % Apply to affected area 3 times daily 15 g 0  . nitroGLYCERIN (NITROSTAT) 0.4 MG SL tablet Place 0.4 mg under  the tongue every 5 (five) minutes as needed. For chest pain    . ondansetron (ZOFRAN) 4 MG tablet Take 1 tablet (4 mg total) by mouth every 8 (eight) hours as needed for nausea or vomiting. 20 tablet 0  . pantoprazole (PROTONIX) 40 MG tablet Take 40 mg by mouth daily.    . prednisoLONE acetate (PRED FORTE) 1 % ophthalmic suspension     . ranitidine (ZANTAC) 150 MG/10ML syrup 150 mg 2 (two) times daily. Taking 10 ml bid    . Secukinumab (COSENTYX 300 DOSE [place]) Inject into the skin every 30 (thirty) days.    Marland Kitchen spironolactone (ALDACTONE) 25 MG tablet Take 25 mg by mouth daily.    Marland Kitchen triamcinolone cream (KENALOG) 0.1 % Apply 1 application topically 2 (two) times daily. Not face, groin, underarms 454 g 11   No current facility-administered medications for this visit.      Allergies:   Patient has no known allergies.   Social History:  The patient  reports that he has quit smoking. He has never used smokeless tobacco. He reports that he does not drink alcohol or use drugs.   Family History:   family history includes Cancer in his mother; Heart disease in his father; Stroke in his father.    Review of Systems: ROS   PHYSICAL EXAM: VS:  There were no vitals taken for this visit. , BMI There is no height or weight on file to calculate BMI. GEN: Well nourished, well developed, in no acute distress  HEENT: normal  Neck: no JVD, carotid bruits, or masses Cardiac: RRR; no murmurs, rubs, or gallops,no edema  Respiratory:  clear to auscultation bilaterally, normal work of breathing GI: soft, nontender, nondistended, + BS MS: no deformity or atrophy  Skin: warm and dry, no rash Neuro:  Strength and sensation are intact Psych: euthymic mood, full affect    Recent Labs: 11/15/2017: ALT 13; BUN 14; Creatinine, Ser 0.95; Hemoglobin 13.4; Platelets 173.0; Potassium 3.7; Sodium 139; TSH 0.87    Lipid Panel Lab Results  Component Value Date   CHOL 178 12/04/2017   HDL 43.40 12/04/2017   TRIG  245.0 (H) 12/04/2017      Wt Readings from Last 3 Encounters:  12/19/17 233 lb (105.7 kg)  12/17/17 225 lb (102.1 kg)  12/04/17 225 lb 6.4 oz (102.2 kg)       ASSESSMENT AND PLAN:  No diagnosis found.   Disposition:   F/U  6 months  No orders of  the defined types were placed in this encounter.    Signed, Esmond Plants, M.D., Ph.D. 12/26/2017  [place], [place]

## 2017-12-27 ENCOUNTER — Ambulatory Visit: Payer: Medicare HMO | Admitting: Cardiovascular Disease

## 2017-12-27 ENCOUNTER — Ambulatory Visit: Payer: Medicare HMO | Admitting: Nurse Practitioner

## 2017-12-30 ENCOUNTER — Other Ambulatory Visit: Payer: Self-pay

## 2017-12-30 ENCOUNTER — Encounter: Payer: Self-pay | Admitting: Emergency Medicine

## 2017-12-30 ENCOUNTER — Emergency Department
Admission: EM | Admit: 2017-12-30 | Discharge: 2017-12-30 | Disposition: A | Discharge status: Home / Self Care | Payer: Medicare HMO | Attending: Emergency Medicine | Admitting: Emergency Medicine

## 2017-12-30 ENCOUNTER — Emergency Department: Payer: Medicare HMO

## 2017-12-30 DIAGNOSIS — Y929 Unspecified place or not applicable: Secondary | ICD-10-CM | POA: Diagnosis not present

## 2017-12-30 DIAGNOSIS — M1990 Unspecified osteoarthritis, unspecified site: Secondary | ICD-10-CM | POA: Diagnosis not present

## 2017-12-30 DIAGNOSIS — Y998 Other external cause status: Secondary | ICD-10-CM | POA: Insufficient documentation

## 2017-12-30 DIAGNOSIS — I2510 Atherosclerotic heart disease of native coronary artery without angina pectoris: Secondary | ICD-10-CM | POA: Diagnosis not present

## 2017-12-30 DIAGNOSIS — Z7982 Long term (current) use of aspirin: Secondary | ICD-10-CM | POA: Insufficient documentation

## 2017-12-30 DIAGNOSIS — M79641 Pain in right hand: Secondary | ICD-10-CM | POA: Diagnosis not present

## 2017-12-30 DIAGNOSIS — I10 Essential (primary) hypertension: Secondary | ICD-10-CM | POA: Diagnosis not present

## 2017-12-30 DIAGNOSIS — W57XXXA Bitten or stung by nonvenomous insect and other nonvenomous arthropods, initial encounter: Secondary | ICD-10-CM | POA: Insufficient documentation

## 2017-12-30 DIAGNOSIS — Z79899 Other long term (current) drug therapy: Secondary | ICD-10-CM | POA: Insufficient documentation

## 2017-12-30 DIAGNOSIS — S40862A Insect bite (nonvenomous) of left upper arm, initial encounter: Secondary | ICD-10-CM | POA: Insufficient documentation

## 2017-12-30 DIAGNOSIS — Z7902 Long term (current) use of antithrombotics/antiplatelets: Secondary | ICD-10-CM | POA: Diagnosis not present

## 2017-12-30 DIAGNOSIS — Z955 Presence of coronary angioplasty implant and graft: Secondary | ICD-10-CM | POA: Diagnosis not present

## 2017-12-30 DIAGNOSIS — Z87891 Personal history of nicotine dependence: Secondary | ICD-10-CM | POA: Diagnosis not present

## 2017-12-30 DIAGNOSIS — Y9389 Activity, other specified: Secondary | ICD-10-CM | POA: Insufficient documentation

## 2017-12-30 DIAGNOSIS — J449 Chronic obstructive pulmonary disease, unspecified: Secondary | ICD-10-CM | POA: Diagnosis not present

## 2017-12-30 DIAGNOSIS — R000 Tachycardia, unspecified: Secondary | ICD-10-CM | POA: Diagnosis not present

## 2017-12-30 DIAGNOSIS — E119 Type 2 diabetes mellitus without complications: Secondary | ICD-10-CM | POA: Diagnosis not present

## 2017-12-30 DIAGNOSIS — Z7984 Long term (current) use of oral hypoglycemic drugs: Secondary | ICD-10-CM | POA: Diagnosis not present

## 2017-12-30 LAB — BASIC METABOLIC PANEL
ANION GAP: 9 (ref 5–15)
BUN: 11 mg/dL (ref 8–23)
CALCIUM: 9 mg/dL (ref 8.9–10.3)
CO2: 30 mmol/L (ref 22–32)
Chloride: 99 mmol/L (ref 98–111)
Creatinine, Ser: 0.92 mg/dL (ref 0.61–1.24)
Glucose, Bld: 105 mg/dL — ABNORMAL HIGH (ref 70–99)
POTASSIUM: 3.7 mmol/L (ref 3.5–5.1)
SODIUM: 138 mmol/L (ref 135–145)

## 2017-12-30 LAB — CBC
HEMATOCRIT: 40.5 % (ref 40.0–52.0)
HEMOGLOBIN: 13.4 g/dL (ref 13.0–18.0)
MCH: 31 pg (ref 26.0–34.0)
MCHC: 33.1 g/dL (ref 32.0–36.0)
MCV: 93.6 fL (ref 80.0–100.0)
Platelets: 169 10*3/uL (ref 150–440)
RBC: 4.33 MIL/uL — ABNORMAL LOW (ref 4.40–5.90)
RDW: 15.8 % — ABNORMAL HIGH (ref 11.5–14.5)
WBC: 6.3 10*3/uL (ref 3.8–10.6)

## 2017-12-30 LAB — URINALYSIS, COMPLETE (UACMP) WITH MICROSCOPIC
BACTERIA UA: NONE SEEN
BILIRUBIN URINE: NEGATIVE
GLUCOSE, UA: NEGATIVE mg/dL
HGB URINE DIPSTICK: NEGATIVE
Ketones, ur: NEGATIVE mg/dL
LEUKOCYTES UA: NEGATIVE
NITRITE: NEGATIVE
Protein, ur: NEGATIVE mg/dL
Specific Gravity, Urine: 1.016 (ref 1.005–1.030)
Squamous Epithelial / HPF: NONE SEEN (ref 0–5)
pH: 6 (ref 5.0–8.0)

## 2017-12-30 LAB — LACTIC ACID, PLASMA: LACTIC ACID, VENOUS: 1.3 mmol/L (ref 0.5–1.9)

## 2017-12-30 MED ORDER — ACETAMINOPHEN 500 MG PO TABS
1000.0000 mg | ORAL_TABLET | Freq: Once | ORAL | Status: AC
  Administered 2017-12-30: 1000 mg via ORAL
  Filled 2017-12-30: qty 2

## 2017-12-30 NOTE — ED Notes (Signed)
This NT has applied an ace bandage on this pt by request of his RN, Gus Puma

## 2017-12-30 NOTE — Discharge Instructions (Addendum)
Take tylenol 1000mg  (2 extra strength) every 8 hours for pain.

## 2017-12-30 NOTE — ED Notes (Signed)
E signature pad not working 

## 2017-12-30 NOTE — ED Provider Notes (Signed)
[facility] Emergency Department Provider Note  ____________________________________________  Time seen: Approximately 2:18 PM  I have reviewed the triage vital signs and the nursing notes.   HISTORY  Chief Complaint Weakness   HPI Nicholas Reyes is a 62 y.o. male with a history as listed below who presents for evaluation of right hand pain.  Patient reports that he woke up this morning with a sharp shooting pain located in the dorsum of his right hand that is present when he tries to open and close his fist.  The pain goes away at rest.  The pain is mild to moderate.  He denies any trauma to his hand.  He denies any weakness or numbness to his hand or arm.  No chest pain or shortness of breath.  No facial droop, slurred speech, difficulty finding words, unilateral weakness or numbness, or headache.  Patient is also complaining of an insect bite that is located on his left arm.  That is itchy.  Past Medical History:  Diagnosis Date  . Arthritis   . CAD (coronary artery disease)   . COPD (chronic obstructive pulmonary disease) ([place])   . Depression   . Diabetes mellitus   . Heart disease    cad with 3 stents   . Hypertension   . Migraines   . Psoriasis    joint pain in knees left hand     Patient Active Problem List   Diagnosis Date Noted  . Hyperlipidemia 12/26/2017  . Cognitive impairment 12/11/2017  . DM2 (diabetes mellitus, type 2) ([place]) 12/04/2017  . HTN (hypertension) 11/15/2017  . CAD (coronary artery disease) 11/15/2017  . Psoriasis 11/15/2017  . Skin sore 11/15/2017  . COPD (chronic obstructive pulmonary disease) ([place]) 11/15/2017  . Diabetes mellitus without complication ([place]) 11/94/1740  . Vitamin D deficiency 11/15/2017  . Osteoarthritis 11/15/2017    Past Surgical History:  Procedure Laterality Date  . APPENDECTOMY    . CATARACT EXTRACTION Bilateral    inserted lens b/l eyes right 03/12/17, left 05/28/17   . CHOLECYSTECTOMY    .  CORONARY ANGIOPLASTY WITH STENT PLACEMENT     LAD, LCX-OM, ? 3rd location  . EYE SURGERY     cataract b/l   . HEMORRHOID SURGERY    . hemorrhoid surgery      x 2   . KNEE SURGERY     right knee arthroscopy, partial synovectecomy 04/21/15 and meniscectomy/chondroplasty   . right knee surgery     ? type of procedure   . STENT PLACEMENT VASCULAR ([place] [facility])    . TONSILLECTOMY    . TONSILLECTOMY AND ADENOIDECTOMY      Prior to Admission medications   Medication Sig Start Date End Date Taking? Authorizing Provider  albuterol (PROVENTIL HFA;VENTOLIN HFA) 108 (90 BASE) MCG/ACT inhaler Inhale 2 puffs into the lungs every 4 (four) hours as needed. For shortness of breath    [provider]  amLODipine (NORVASC) 5 MG tablet Take 5 mg by mouth daily.    [provider]  aspirin 81 MG EC tablet Take 81 mg by mouth daily.     [provider]  atorvastatin (LIPITOR) 20 MG tablet Take 20 mg by mouth daily.    [provider]  budesonide-formoterol (SYMBICORT) 80-4.5 MCG/ACT inhaler Inhale 2 puffs into the lungs 2 (two) times daily.    [provider]  cetirizine (ZYRTEC) 10 MG tablet Take 10 mg by mouth daily.    [provider]  Cholecalciferol 50000 units capsule Take 1 capsule (50,000 Units total) by mouth once a week. 12/10/17   McLean-Scocuzza, Nino Glow, MD  clopidogrel (PLAVIX) 75 MG tablet Take 75 mg by mouth daily.    [provider]  diclofenac (FLECTOR) 1.3 % PTCH Place 1 patch onto the skin 2 (two) times daily. 11/30/17   McLean-Scocuzza, Nino Glow, MD  doxycycline (VIBRA-TABS) 100 MG tablet Take 1 tablet (100 mg total) by mouth 2 (two) times daily. With food 12/04/17   McLean-Scocuzza, Nino Glow, MD  DULoxetine (CYMBALTA) 60 MG capsule Take 60 mg by mouth daily.    [provider]  furosemide (LASIX) 40 MG tablet Take 40 mg by mouth daily.    [provider]  gabapentin (NEURONTIN) 300 MG capsule Take 300-600 mg by mouth  2 (two) times daily. 300 mg qd and 600 mg qhs    [provider]  hydrOXYzine (ATARAX/VISTARIL) 25 MG tablet Take 1 tablet (25 mg total) by mouth 3 (three) times daily as needed. 11/28/17   Triplett, Johnette Abraham B, FNP  isosorbide mononitrate (IMDUR) 30 MG 24 hr tablet Take 30 mg by mouth daily. Per pharmacy taking 20 mg qd    [provider]  ketorolac (ACULAR) 0.5 % ophthalmic solution  12/03/17   [provider]  loperamide (IMODIUM A-D) 2 MG tablet Take 1 tablet (2 mg total) by mouth 4 (four) times daily as needed for diarrhea or loose stools. 12/19/17   Jodelle Green, FNP  metFORMIN (GLUCOPHAGE) 500 MG tablet Take 1 tablet (500 mg total) by mouth daily with breakfast. 11/23/17   McLean-Scocuzza, Nino Glow, MD  metFORMIN (GLUCOPHAGE) 500 MG tablet Take 1 tablet (500 mg total) by mouth daily with breakfast. 11/23/17   McLean-Scocuzza, Nino Glow, MD  methylPREDNISolone (MEDROL DOSEPAK) 4 MG TBPK tablet Take Tapered dose as directed 11/18/17   Sable Feil, PA-C  metoprolol tartrate (LOPRESSOR) 25 MG tablet Take 25 mg by mouth 2 (two) times daily.    [provider]  montelukast (SINGULAIR) 10 MG tablet Take 10 mg by mouth at bedtime.    [provider]  mupirocin ointment (BACTROBAN) 2 % Apply to affected area 3 times daily 11/18/17 11/18/18  Sable Feil, PA-C  nitroGLYCERIN (NITROSTAT) 0.4 MG SL tablet Place 0.4 mg under the tongue every 5 (five) minutes as needed. For chest pain    [provider]  ondansetron (ZOFRAN) 4 MG tablet Take 1 tablet (4 mg total) by mouth every 8 (eight) hours as needed for nausea or vomiting. 12/19/17   Guse, Jacquelynn Cree, FNP  pantoprazole (PROTONIX) 40 MG tablet Take 40 mg by mouth daily.    [provider]  prednisoLONE acetate (PRED FORTE) 1 % ophthalmic suspension  11/30/17   [provider]  ranitidine (ZANTAC) 150 MG/10ML syrup 150 mg 2 (two) times daily. Taking 10 ml bid    [provider]    Secukinumab (COSENTYX 300 DOSE [place]) Inject into the skin every 30 (thirty) days.    [provider]  spironolactone (ALDACTONE) 25 MG tablet Take 25 mg by mouth daily.    [provider]  triamcinolone cream (KENALOG) 0.1 % Apply 1 application topically 2 (two) times daily. Not face, groin, underarms 11/15/17   McLean-Scocuzza, Nino Glow, MD    Allergies Patient has no known allergies.  Family History  Problem Relation Age of Onset  . Cancer Mother   . Heart disease Father   . Stroke Father  Social History Social History   Tobacco Use  . Smoking status: Former Research scientist (life sciences)  . Smokeless tobacco: Never Used  Substance Use Topics  . Alcohol use: No    Frequency: Never  . Drug use: No    Review of Systems  Constitutional: Negative for fever. Eyes: Negative for visual changes. ENT: Negative for sore throat. Neck: No neck pain  Cardiovascular: Negative for chest pain. Respiratory: Negative for shortness of breath. Gastrointestinal: Negative for abdominal pain, vomiting or diarrhea. Genitourinary: Negative for dysuria. Musculoskeletal: Negative for back pain. + R hand pain Skin: Negative for rash. + insect bite Neurological: Negative for headaches, weakness or numbness. Psych: No SI or HI  ____________________________________________   PHYSICAL EXAM:  VITAL SIGNS: ED Triage Vitals  Enc Vitals Group     BP 12/30/17 1312 128/78     Pulse Rate 12/30/17 1312 (!) 42     Resp 12/30/17 1312 18     Temp 12/30/17 1312 98.7 F (37.1 C)     Temp Source 12/30/17 1312 Oral     SpO2 12/30/17 1311 94 %     Weight 12/30/17 1313 233 lb 0.4 oz (105.7 kg)     Height 12/30/17 1313 5\' 7"  (1.702 m)     Head Circumference --      Peak Flow --      Pain Score 12/30/17 1312 9     Pain Loc --      Pain Edu? --      Excl. in [place]? --     Constitutional: Alert and oriented. Well appearing and in no apparent distress. HEENT:      Head: Normocephalic and atraumatic.          Eyes: Conjunctivae are normal. Sclera is non-icteric.       Mouth/Throat: Mucous membranes are moist.       Neck: Supple with no signs of meningismus. Cardiovascular: Regular rate and rhythm. No murmurs, gallops, or rubs. 2+ symmetrical distal pulses are present in all extremities. No JVD. Respiratory: Normal respiratory effort. Lungs are clear to auscultation bilaterally. No wheezes, crackles, or rhonchi.  Gastrointestinal: Soft, non tender, and non distended with positive bowel sounds. No rebound or guarding. Musculoskeletal: Diffuse tenderness to palpation over the R 2nd-4th metacarpals with no erythema, laceration, deformity, or crepitus Neurologic: Normal speech and language. Face is symmetric.Intact strength and sensation x 4.  Skin: Skin is warm, dry and intact. No rash noted. There is an insect bite located on the L forearm with no evidence of abscess or overlying cellulitis. There are several insect bites on the R buttock with excoriations Psychiatric: Mood and affect are normal. Speech and behavior are normal.  ____________________________________________   LABS (all labs ordered are listed, but only abnormal results are displayed)  Labs Reviewed  BASIC METABOLIC PANEL - Abnormal; Notable for the following components:      Result Value   Glucose, Bld 105 (*)    All other components within normal limits  CBC - Abnormal; Notable for the following components:   RBC 4.33 (*)    RDW 15.8 (*)    All other components within normal limits  URINALYSIS, COMPLETE (UACMP) WITH MICROSCOPIC - Abnormal; Notable for the following components:   Color, Urine YELLOW (*)    APPearance CLEAR (*)    All other components within normal limits  LACTIC ACID, PLASMA  LACTIC ACID, PLASMA  CBG MONITORING, ED   ____________________________________________  EKG  ED ECG REPORT I, Rudene Re, the attending physician,  personally viewed and interpreted this ECG.  Sinus tachycardia, rate of  108, normal intervals, normal axis, no ST elevations or depressions.  Unchanged from prior from 2013 ____________________________________________  RADIOLOGY  I have personally reviewed the images performed during this visit and I agree with the Radiologist's read.   Interpretation by Radiologist:  Dg Hand Complete Right  Result Date: 12/30/2017 CLINICAL DATA:  Pain with flexion of the right hand. EXAM: RIGHT HAND - COMPLETE 3+ VIEW COMPARISON:  None. FINDINGS: Spurring of the thumb metacarpal head. Interphalangeal joints appear well maintained. Minimal spurring at the MCP joints. No appreciable erosions. IMPRESSION: 1. Mild degenerative spurring primarily the MCP joints, with preserved articular spaces and no significant erosions. Electronically Signed   By: Van Clines M.D.   On: 12/30/2017 14:37     ____________________________________________   PROCEDURES  Procedure(s) performed: None Procedures Critical Care performed:  None ____________________________________________   INITIAL IMPRESSION / ASSESSMENT AND PLAN / ED COURSE   62 y.o. male with a history as listed below who presents for evaluation of right hand pain.  Patient woke up with pain in his right hand that is present with opening closing of his fist, he has diffuse tenderness over the second third and fourth metacarpals with no obvious deformities, bruising, or evidence of cellulitis.  Strong and intact pulses in all extremities.  Has also several insect bites with excoriations overlying them, no evidence of abscess or cellulitis. XR of the hand showed arthritis and spurring of MCP joints. Labs and EKG were done in triage as patient told triage nurse that he had open wounds on his buttock and also that he felt weak on his R arm. Patient does not have open wounds just several bites with overlying excoriations and patient denies having any weakness or numbness of his hand or arm, he says he has pain when he closes his  fist which makes him open the hand and drop things due to the pain. He is completely neurologically intact with no weakness, no sensory deficits, no dysmetria or pronator drift. Will dc home on tylenol for pain and follow up with his PCP.    _________________________     As part of my medical decision making, I reviewed the following data within the [facility] notes reviewed and incorporated, Labs reviewed , EKG interpreted , Old EKG reviewed, Old chart reviewed, Radiograph reviewed , Notes from prior ED visits and [place] Controlled Substance Database    Pertinent labs & imaging results that were available during my care of the patient were reviewed by me and considered in my medical decision making (see chart for details).    ____________________________________________   FINAL CLINICAL IMPRESSION(S) / ED DIAGNOSES  Final diagnoses:  Pain of right hand  Arthritis      NEW MEDICATIONS STARTED DURING THIS VISIT:  ED Discharge Orders    None       Note:  This document was prepared using Dragon voice recognition software and may include unintentional dictation errors.    Alfred Levins, [place], MD 12/30/17 [phone]

## 2017-12-30 NOTE — ED Triage Notes (Signed)
Pt presents with weakness/pain in his right arm upon awakening this morning at 0830. He went to bed at 2330 last night. Pt also states he has insect bites on his hand, buttocks. Pt alert & oriented. NAD noted.

## 2017-12-31 ENCOUNTER — Encounter: Payer: Self-pay | Admitting: Internal Medicine

## 2017-12-31 ENCOUNTER — Ambulatory Visit (INDEPENDENT_AMBULATORY_CARE_PROVIDER_SITE_OTHER): Payer: Medicare HMO | Admitting: Internal Medicine

## 2017-12-31 ENCOUNTER — Encounter: Payer: Self-pay | Admitting: Cardiovascular Disease

## 2017-12-31 VITALS — BP 136/64 | HR 78 | Temp 98.0°F | Ht 67.0 in | Wt 237.2 lb

## 2017-12-31 DIAGNOSIS — M1990 Unspecified osteoarthritis, unspecified site: Secondary | ICD-10-CM

## 2017-12-31 MED ORDER — PREDNISONE 20 MG PO TABS: 20.0000 mg | ORAL_TABLET | Freq: Every day | ORAL | 0 refills | Status: DC

## 2017-12-31 MED ORDER — MELOXICAM 7.5 MG PO TABS: 7.5000 mg | ORAL_TABLET | Freq: Two times a day (BID) | ORAL | 2 refills | Status: DC | PRN

## 2017-12-31 NOTE — Progress Notes (Signed)
Pre visit review using our clinic review tool, if applicable. No additional management support is needed unless otherwise documented below in the visit note. 

## 2017-12-31 NOTE — Patient Instructions (Addendum)
Use creams triamcinolone and bactroban/mupiricin cream to left arm  Take Mobic and prednisone (steroid) for hand pain. Take Mobic only as needed and take steroid only x 1 week  Tylenol 500 mg up to 6 pills total in 1 day  Call to reschedule pain clinic appt    Arthritis Arthritis is a term that is commonly used to refer to joint pain or joint disease. There are more than 100 types of arthritis. What are the causes? The most common cause of this condition is wear and tear of a joint. Other causes include:  Gout.  Inflammation of a joint.  An infection of a joint.  Sprains and other injuries near the joint.  A drug reaction or allergic reaction.  In some cases, the cause may not be known. What are the signs or symptoms? The main symptom of this condition is pain in the joint with movement. Other symptoms include:  Redness, swelling, or stiffness at a joint.  Warmth coming from the joint.  Fever.  Overall feeling of illness.  How is this diagnosed? This condition may be diagnosed with a physical exam and tests, including:  Blood tests.  Urine tests.  Imaging tests, such as MRI, X-rays, or a CT scan.  Sometimes, fluid is removed from a joint for testing. How is this treated? Treatment for this condition may involve:  Treatment of the cause, if it is known.  Rest.  Raising (elevating) the joint.  Applying cold or hot packs to the joint.  Medicines to improve symptoms and reduce inflammation.  Injections of a steroid such as cortisone into the joint to help reduce pain and inflammation.  Depending on the cause of your arthritis, you may need to make lifestyle changes to reduce stress on your joint. These changes may include exercising more and losing weight. Follow these instructions at home: Medicines  Take over-the-counter and prescription medicines only as told by your health care provider.  Do not take aspirin to relieve pain if gout is  suspected. Activity  Rest your joint if told by your health care provider. Rest is important when your disease is active and your joint feels painful, swollen, or stiff.  Avoid activities that make the pain worse. It is important to balance activity with rest.  Exercise your joint regularly with range-of-motion exercises as told by your health care provider. Try doing low-impact exercise, such as: ? Swimming. ? Water aerobics. ? Biking. ? Walking. Joint Care   If your joint is swollen, keep it elevated if told by your health care provider.  If your joint feels stiff in the morning, try taking a warm shower.  If directed, apply heat to the joint. If you have diabetes, do not apply heat without permission from your health care provider. ? Put a towel between the joint and the hot pack or heating pad. ? Leave the heat on the area for 20-30 minutes.  If directed, apply ice to the joint: ? Put ice in a plastic bag. ? Place a towel between your skin and the bag. ? Leave the ice on for 20 minutes, 2-3 times per day.  Keep all follow-up visits as told by your health care provider. This is important. Contact a health care provider if:  The pain gets worse.  You have a fever. Get help right away if:  You develop severe joint pain, swelling, or redness.  Many joints become painful and swollen.  You develop severe back pain.  You develop severe weakness  in your leg.  You cannot control your bladder or bowels. This information is not intended to replace advice given to you by your health care provider. Make sure you discuss any questions you have with your health care provider. Document Released: 06/01/2004 Document Revised: 09/30/2015 Document Reviewed: 07/20/2014 Elsevier Interactive Patient Education  [employer].

## 2017-12-31 NOTE — Progress Notes (Signed)
Chief Complaint  Patient presents with  . Hand Pain   F/u  1. C/o right hand pain Xray in ED with mild MCP spurring/arthrtis in thumb as well  2. C/o left forearm skin lesion ? Bite nothing tried area is itching  3. Chronic pain no showed for pain clinic appt and will have pt call to resch  4. Arthritis knees steroid injection right knee helped   Review of Systems  Constitutional: Negative for weight loss.  HENT: Negative for hearing loss.   Eyes: Negative for blurred vision.  Respiratory: Negative for shortness of breath.   Cardiovascular: Negative for chest pain.  Musculoskeletal: Positive for joint pain.  Skin: Negative for rash.       +skin lesions   Past Medical History:  Diagnosis Date  . Arthritis   . CAD (coronary artery disease)   . COPD (chronic obstructive pulmonary disease) ([place])   . Depression   . Diabetes mellitus   . Heart disease    cad with 3 stents   . Hypertension   . Migraines   . Psoriasis    joint pain in knees left hand    Past Surgical History:  Procedure Laterality Date  . APPENDECTOMY    . CATARACT EXTRACTION Bilateral    inserted lens b/l eyes right 03/12/17, left 05/28/17   . CHOLECYSTECTOMY    . CORONARY ANGIOPLASTY WITH STENT PLACEMENT     LAD, LCX-OM, ? 3rd location  . EYE SURGERY     cataract b/l   . HEMORRHOID SURGERY    . hemorrhoid surgery      x 2   . KNEE SURGERY     right knee arthroscopy, partial synovectecomy 04/21/15 and meniscectomy/chondroplasty   . right knee surgery     ? type of procedure   . STENT PLACEMENT VASCULAR ([place] [facility])    . TONSILLECTOMY    . TONSILLECTOMY AND ADENOIDECTOMY     Family History  Problem Relation Age of Onset  . Cancer Mother   . Heart disease Father   . Stroke Father    Social History   Socioeconomic History  . Marital status: Single    Spouse name: Not on file  . Number of children: 0  . Years of education: Not on file  . Highest education level: Not on file  Occupational History    . Not on file  Social Needs  . Financial resource strain: Not on file  . Food insecurity:    Worry: Not on file    Inability: Not on file  . Transportation needs:    Medical: Not on file    Non-medical: Not on file  Tobacco Use  . Smoking status: Former Research scientist (life sciences)  . Smokeless tobacco: Never Used  Substance and Sexual Activity  . Alcohol use: No    Frequency: Never  . Drug use: No  . Sexual activity: Not Currently  Lifestyle  . Physical activity:    Days per week: Not on file    Minutes per session: Not on file  . Stress: Not on file  Relationships  . Social connections:    Talks on phone: Not on file    Gets together: Not on file    Attends religious service: Not on file    Active member of club or organization: Not on file    Attends meetings of clubs or organizations: Not on file    Relationship status: Not on file  . Intimate partner violence:    Fear  of current or ex partner: Not on file    Emotionally abused: Not on file    Physically abused: Not on file    Forced sexual activity: Not on file  Other Topics Concern  . Not on file  Social History Narrative   Former smoker quit 2013/2014 used to smoke<1 ppd x 2-3 years    No kids   Single not sexually active    Unable to read/write    Has dogs and cats at home    No guns   Wears seat belt    Current Meds  Medication Sig  . albuterol (PROVENTIL HFA;VENTOLIN HFA) 108 (90 BASE) MCG/ACT inhaler Inhale 2 puffs into the lungs every 4 (four) hours as needed. For shortness of breath  . amLODipine (NORVASC) 5 MG tablet Take 5 mg by mouth daily.  Marland Kitchen aspirin 81 MG EC tablet Take 81 mg by mouth daily.   Marland Kitchen atorvastatin (LIPITOR) 20 MG tablet Take 20 mg by mouth daily.  . budesonide-formoterol (SYMBICORT) 80-4.5 MCG/ACT inhaler Inhale 2 puffs into the lungs 2 (two) times daily.  . cetirizine (ZYRTEC) 10 MG tablet Take 10 mg by mouth daily.  . Cholecalciferol 50000 units capsule Take 1 capsule (50,000 Units total) by mouth once  a week.  . clopidogrel (PLAVIX) 75 MG tablet Take 75 mg by mouth daily.  . diclofenac (FLECTOR) 1.3 % PTCH Place 1 patch onto the skin 2 (two) times daily.  Marland Kitchen doxycycline (VIBRA-TABS) 100 MG tablet Take 1 tablet (100 mg total) by mouth 2 (two) times daily. With food  . DULoxetine (CYMBALTA) 60 MG capsule Take 60 mg by mouth daily.  . furosemide (LASIX) 40 MG tablet Take 40 mg by mouth daily.  Marland Kitchen gabapentin (NEURONTIN) 300 MG capsule Take 300-600 mg by mouth 2 (two) times daily. 300 mg qd and 600 mg qhs  . hydrOXYzine (ATARAX/VISTARIL) 25 MG tablet Take 1 tablet (25 mg total) by mouth 3 (three) times daily as needed.  . isosorbide mononitrate (IMDUR) 30 MG 24 hr tablet Take 30 mg by mouth daily. Per pharmacy taking 20 mg qd  . ketorolac (ACULAR) 0.5 % ophthalmic solution   . loperamide (IMODIUM A-D) 2 MG tablet Take 1 tablet (2 mg total) by mouth 4 (four) times daily as needed for diarrhea or loose stools.  . metFORMIN (GLUCOPHAGE) 500 MG tablet Take 1 tablet (500 mg total) by mouth daily with breakfast.  . metFORMIN (GLUCOPHAGE) 500 MG tablet Take 1 tablet (500 mg total) by mouth daily with breakfast.  . metoprolol tartrate (LOPRESSOR) 25 MG tablet Take 25 mg by mouth 2 (two) times daily.  . montelukast (SINGULAIR) 10 MG tablet Take 10 mg by mouth at bedtime.  . mupirocin ointment (BACTROBAN) 2 % Apply to affected area 3 times daily  . nitroGLYCERIN (NITROSTAT) 0.4 MG SL tablet Place 0.4 mg under the tongue every 5 (five) minutes as needed. For chest pain  . ondansetron (ZOFRAN) 4 MG tablet Take 1 tablet (4 mg total) by mouth every 8 (eight) hours as needed for nausea or vomiting.  . pantoprazole (PROTONIX) 40 MG tablet Take 40 mg by mouth daily.  . prednisoLONE acetate (PRED FORTE) 1 % ophthalmic suspension   . ranitidine (ZANTAC) 150 MG/10ML syrup 150 mg 2 (two) times daily. Taking 10 ml bid  . Secukinumab (COSENTYX 300 DOSE [place]) Inject into the skin every 30 (thirty) days.  Marland Kitchen spironolactone  (ALDACTONE) 25 MG tablet Take 25 mg by mouth daily.  Marland Kitchen triamcinolone cream (  KENALOG) 0.1 % Apply 1 application topically 2 (two) times daily. Not face, groin, underarms  . [DISCONTINUED] methylPREDNISolone (MEDROL DOSEPAK) 4 MG TBPK tablet Take Tapered dose as directed   No Known Allergies Recent Results (from the past 2160 hour(s))  Comprehensive metabolic panel     Status: None   Collection Time: 11/15/17  4:47 PM  Result Value Ref Range   Sodium 139 135 - 145 mEq/L   Potassium 3.7 3.5 - 5.1 mEq/L   Chloride 98 96 - 112 mEq/L   CO2 32 19 - 32 mEq/L   Glucose, Bld 80 70 - 99 mg/dL   BUN 14 6 - 23 mg/dL   Creatinine, Ser 0.95 0.40 - 1.50 mg/dL   Total Bilirubin 0.4 0.2 - 1.2 mg/dL   Alkaline Phosphatase 78 39 - 117 U/L   AST 13 0 - 37 U/L   ALT 13 0 - 53 U/L   Total Protein 7.2 6.0 - 8.3 g/dL   Albumin 4.2 3.5 - 5.2 g/dL   Calcium 9.1 8.4 - 10.5 mg/dL   GFR 85.68 >60.00 mL/min  CBC with Differential/Platelet     Status: None   Collection Time: 11/15/17  4:47 PM  Result Value Ref Range   WBC 7.5 4.0 - 10.5 K/uL   RBC 4.29 4.22 - 5.81 Mil/uL   Hemoglobin 13.4 13.0 - 17.0 g/dL   HCT 40.2 39.0 - 52.0 %   MCV 93.6 78.0 - 100.0 fl   MCHC 33.4 30.0 - 36.0 g/dL   RDW 15.4 11.5 - 15.5 %   Platelets 173.0 150.0 - 400.0 K/uL   Neutrophils Relative % 57.7 43.0 - 77.0 %   Lymphocytes Relative 31.3 12.0 - 46.0 %   Monocytes Relative 7.8 3.0 - 12.0 %   Eosinophils Relative 2.0 0.0 - 5.0 %   Basophils Relative 1.2 0.0 - 3.0 %   Neutro Abs 4.3 1.4 - 7.7 K/uL   Lymphs Abs 2.4 0.7 - 4.0 K/uL   Monocytes Absolute 0.6 0.1 - 1.0 K/uL   Eosinophils Absolute 0.2 0.0 - 0.7 K/uL   Basophils Absolute 0.1 0.0 - 0.1 K/uL  Urinalysis, Routine w reflex microscopic     Status: None   Collection Time: 11/15/17  4:47 PM  Result Value Ref Range   Specific Gravity, UA CANCELED     Comment: Quantity was not sufficient for analysis.  Result canceled by the ancillary.    pH, UA CANCELED     Comment:  Test not performed  Result canceled by the ancillary.    Protein, UA CANCELED     Comment: Test not performed  Result canceled by the ancillary.    Glucose, UA CANCELED     Comment: Test not performed  Result canceled by the ancillary.    Ketones, UA CANCELED     Comment: Test not performed  Result canceled by the ancillary.   TSH     Status: None   Collection Time: 11/15/17  4:47 PM  Result Value Ref Range   TSH 0.87 0.35 - 4.50 uIU/mL  Hemoglobin A1c     Status: Abnormal   Collection Time: 11/15/17  4:47 PM  Result Value Ref Range   Hgb A1c MFr Bld 6.6 (H) 4.6 - 6.5 %    Comment: Glycemic Control Guidelines for People with Diabetes:Non Diabetic:  <6%Goal of Therapy: <7%Additional Action Suggested:  >8%   PSA, Medicare     Status: None   Collection Time: 11/15/17  4:47 PM  Result  Value Ref Range   PSA 0.26 0.10 - 4.00 ng/ml    Comment: Test performed using Access Hybritech PSA Assay, a parmagnetic partical, chemiluminecent immunoassay.  Rheumatoid Factor     Status: None   Collection Time: 11/15/17  4:47 PM  Result Value Ref Range   Rhuematoid fact SerPl-aCnc <14 <14 IU/mL  Sedimentation rate     Status: None   Collection Time: 11/15/17  4:47 PM  Result Value Ref Range   Sed Rate 12 0 - 20 mm/hr  C-reactive protein     Status: None   Collection Time: 11/15/17  4:47 PM  Result Value Ref Range   CRP 1.1 0.5 - 20.0 mg/dL  Antinuclear Antib (ANA)     Status: None   Collection Time: 11/15/17  4:47 PM  Result Value Ref Range   Anti Nuclear Antibody(ANA) NEGATIVE NEGATIVE    Comment: ANA IFA is a first line screen for detecting the presence of up to approximately 150 autoantibodies in various autoimmune diseases. A negative ANA IFA result suggests ANA-associated autoimmune diseases are not present at this time. . Visit Physician FAQs for interpretation of all antibodies in the [facility], prevalence, and association with diseases at  http://education.[facility].com/ TZG/YFV494 .   Cyclic citrul peptide antibody, IgG     Status: None   Collection Time: 11/15/17  4:47 PM  Result Value Ref Range   Cyclic Citrullin Peptide Ab <16 UNITS    Comment: Reference Range Negative:            <20 Weak Positive:       20-39 Moderate Positive:   40-59 Strong Positive:     >59 .   Hepatitis C antibody     Status: None   Collection Time: 11/15/17  4:47 PM  Result Value Ref Range   Hepatitis C Ab NON-REACTIVE NON-REACTI   SIGNAL TO CUT-OFF 0.02 <1.00    Comment: . HCV antibody was non-reactive. There is no laboratory  evidence of HCV infection. . In most cases, no further action is required. However, if recent HCV exposure is suspected, a test for HCV RNA (test code [phone]) is suggested. . For additional information please refer to http://education.[facility].com/faq/FAQ22v1 (This link is being provided for informational/ educational purposes only.) .   Measles/Mumps/Rubella Immunity     Status: None   Collection Time: 11/15/17  4:47 PM  Result Value Ref Range   [place] IgG 208.00 AU/mL    Comment: AU/mL            Interpretation -----            -------------- <25.00           Negative 25.00-29.99      Equivocal >29.99           Positive . A positive result indicates that the patient has antibody to measles virus. It does not differentiate  between an active or past infection. The clinical  diagnosis must be interpreted in conjunction with  clinical signs and symptoms of the patient.    Mumps IgG >300.00 AU/mL    Comment:  AU/mL           Interpretation -------         ---------------- <9.00             Negative 9.00-10.99        Equivocal >10.99            Positive A positive result indicates that the patient has  antibody to mumps  virus. It does not differentiate between an  active or past infection. The clinical diagnosis must be interpreted in conjunction with clinical signs and symptoms  of the patient. .    Rubella 14.80 index    Comment:     Index            Interpretation     -----            --------------       <0.90            Not consistent with Immunity     0.90-0.99        Equivocal     > or = 1.00      Consistent with Immunity  . The presence of rubella IgG antibody suggests  immunization or past or current infection with rubella virus.   Hepatitis B surface antigen     Status: None   Collection Time: 11/15/17  4:47 PM  Result Value Ref Range   Hepatitis B Surface Ag NON-REACTIVE NON-REACTI  Hepatitis B surface antibody     Status: Abnormal   Collection Time: 11/15/17  4:47 PM  Result Value Ref Range   Hepatitis B-Post <5 (L) > OR = 10 mIU/mL    Comment: Verified by repeat analysis. . . Patient does not have immunity to hepatitis B virus. . For additional information, please refer to http://education.[facility].com/faq/FAQ105 (This link is being provided for informational/ educational purposes only).   TEST AUTHORIZATION     Status: None   Collection Time: 11/15/17  4:47 PM  Result Value Ref Range   TEST NAME: HEPATITIS B SURFACE MMR (IGG)    TEST CODE: [id number]    CLIENT CONTACT: LATOYA WRIGHT    REPORT ALWAYS MESSAGE SIGNATURE      Comment: . The laboratory testing on this patient was verbally requested or confirmed by the ordering physician or his or her authorized representative after contact with an employee of [employer]. Federal regulations require that we maintain on file written authorization for all laboratory testing.  Accordingly we are asking that the ordering physician or his or her authorized representative sign a copy of this report and promptly return it to the client service representative. . . Signature:____________________________________________________ . Please fax this signed page to [phone] or return it via your [employer] courier.   Vitamin D (25 hydroxy)     Status:  Abnormal   Collection Time: 11/16/17  8:22 AM  Result Value Ref Range   VITD 11.36 (L) 30.00 - 100.00 ng/mL  Lipid panel     Status: Abnormal   Collection Time: 12/04/17  2:30 PM  Result Value Ref Range   Cholesterol 178 0 - 200 mg/dL    Comment: ATP III Classification       Desirable:  < 200 mg/dL               Borderline High:  200 - 239 mg/dL          High:  > = 240 mg/dL   Triglycerides 245.0 (H) 0.0 - 149.0 mg/dL    Comment: Normal:  <150 mg/dLBorderline High:  150 - 199 mg/dL   HDL 43.40 >39.00 mg/dL   VLDL 49.0 (H) 0.0 - 40.0 mg/dL   Total CHOL/HDL Ratio 4     Comment:                Men          Women1/2 Average Risk  3.4          3.3Average Risk          5.0          4.42X Average Risk          9.6          7.13X Average Risk          15.0          11.0                       NonHDL 134.43     Comment: NOTE:  Non-HDL goal should be 30 mg/dL higher than patient's LDL goal (i.e. LDL goal of < 70 mg/dL, would have non-HDL goal of < 100 mg/dL)  Urine Microalbumin w/creat. ratio     Status: Abnormal   Collection Time: 12/04/17  2:30 PM  Result Value Ref Range   Microalb, Ur 3.2 (H) 0.0 - 1.9 mg/dL   Creatinine,U 334.8 mg/dL   Microalb Creat Ratio 1.0 0.0 - 30.0 mg/g  LDL cholesterol, direct     Status: None   Collection Time: 12/04/17  2:30 PM  Result Value Ref Range   Direct LDL 114.0 mg/dL    Comment: Optimal:  <100 mg/dLNear or Above Optimal:  100-129 mg/dLBorderline High:  130-159 mg/dLHigh:  160-189 mg/dLVery High:  >190 mg/dL  Basic metabolic panel     Status: Abnormal   Collection Time: 12/30/17  1:16 PM  Result Value Ref Range   Sodium 138 135 - 145 mmol/L   Potassium 3.7 3.5 - 5.1 mmol/L   Chloride 99 98 - 111 mmol/L   CO2 30 22 - 32 mmol/L   Glucose, Bld 105 (H) 70 - 99 mg/dL   BUN 11 8 - 23 mg/dL   Creatinine, Ser 0.92 0.61 - 1.24 mg/dL   Calcium 9.0 8.9 - 10.3 mg/dL   GFR calc non Af Amer >60 >60 mL/min   GFR calc Af Amer >60 >60 mL/min    Comment:  (NOTE) The eGFR has been calculated using the CKD EPI equation. This calculation has not been validated in all clinical situations. eGFR's persistently <60 mL/min signify possible Chronic Kidney Disease.    Anion gap 9 5 - 15    Comment: Performed at [facility], [address] [place], [place] [address]  CBC     Status: Abnormal   Collection Time: 12/30/17  1:16 PM  Result Value Ref Range   WBC 6.3 3.8 - 10.6 K/uL   RBC 4.33 (L) 4.40 - 5.90 MIL/uL   Hemoglobin 13.4 13.0 - 18.0 g/dL   HCT 40.5 40.0 - 52.0 %   MCV 93.6 80.0 - 100.0 fL   MCH 31.0 26.0 - 34.0 pg   MCHC 33.1 32.0 - 36.0 g/dL   RDW 15.8 (H) 11.5 - 14.5 %   Platelets 169 150 - 440 K/uL    Comment: Performed at [facility], [address] [place], [place] [address]  Urinalysis, Complete w Microscopic     Status: Abnormal   Collection Time: 12/30/17  1:16 PM  Result Value Ref Range   Color, Urine YELLOW (A) YELLOW   APPearance CLEAR (A) CLEAR   Specific Gravity, Urine 1.016 1.005 - 1.030   pH 6.0 5.0 - 8.0   Glucose, UA NEGATIVE NEGATIVE mg/dL   Hgb urine dipstick NEGATIVE NEGATIVE   Bilirubin Urine NEGATIVE NEGATIVE   Ketones, ur NEGATIVE NEGATIVE mg/dL   Protein, ur  NEGATIVE NEGATIVE mg/dL   Nitrite NEGATIVE NEGATIVE   Leukocytes, UA NEGATIVE NEGATIVE   RBC / HPF 0-5 0 - 5 RBC/hpf   WBC, UA 0-5 0 - 5 WBC/hpf   Bacteria, UA NONE SEEN NONE SEEN   Squamous Epithelial / LPF NONE SEEN 0 - 5   Mucus PRESENT     Comment: Performed at [facility], [address] [place], [place] [address]  Lactic acid, plasma     Status: None   Collection Time: 12/30/17  1:16 PM  Result Value Ref Range   Lactic Acid, Venous 1.3 0.5 - 1.9 mmol/L    Comment: Performed at [facility], [address] [place], [place] [address]   Objective  Body mass index is 37.15 kg/m. Wt Readings from Last 3 Encounters:  12/31/17 237 lb 3.2 oz (107.6 kg)  12/30/17 233 lb 0.4 oz (105.7 kg)  12/19/17  233 lb (105.7 kg)   Temp Readings from Last 3 Encounters:  12/31/17 98 F (36.7 C) (Oral)  12/30/17 98.7 F (37.1 C) (Oral)  12/19/17 98.2 F (36.8 C) (Oral)   BP Readings from Last 3 Encounters:  12/31/17 136/64  12/30/17 136/76  12/19/17 128/80   Pulse Readings from Last 3 Encounters:  12/31/17 78  12/30/17 88  12/19/17 88    Physical Exam  Constitutional: He is oriented to person, place, and time. Vital signs are normal. He appears well-developed and well-nourished. He is cooperative.  HENT:  Head: Normocephalic and atraumatic.  Mouth/Throat: Oropharynx is clear and moist and mucous membranes are normal.  Eyes: Pupils are equal, round, and reactive to light. Conjunctivae are normal.  Cardiovascular: Normal rate, regular rhythm and normal heart sounds.  Pulmonary/Chest: Effort normal and breath sounds normal.  Musculoskeletal:       Right wrist: He exhibits tenderness.  ttp MCP/PIP joints right hand   Neurological: He is alert and oriented to person, place, and time. Gait normal.  Skin: Skin is warm and dry.     Psychiatric: He has a normal mood and affect. His speech is normal and behavior is normal. Judgment and thought content normal. Cognition and memory are normal.  Nursing note and vitals reviewed.   Assessment   1. Arthritis right hand noted on Xray 12/30/17, arthritis knees and chronic pain 2/2 arthritis  2. Left forearm skin ? Bite Plan  1. Prednisone, mobic, tylenol  Pt to call and resch appt with pain clinic for chronic pain  2.bactroban [facility] f/u dermatology  Provider: Dr. Olivia Mackie McLean-Scocuzza-Internal Medicine

## 2018-01-02 ENCOUNTER — Other Ambulatory Visit: Payer: Self-pay

## 2018-01-02 ENCOUNTER — Encounter: Payer: Self-pay | Admitting: Nurse Practitioner

## 2018-01-02 ENCOUNTER — Ambulatory Visit: Payer: Medicare HMO | Attending: Nurse Practitioner | Admitting: Nurse Practitioner

## 2018-01-02 DIAGNOSIS — Z7984 Long term (current) use of oral hypoglycemic drugs: Secondary | ICD-10-CM | POA: Diagnosis not present

## 2018-01-02 DIAGNOSIS — Z79891 Long term (current) use of opiate analgesic: Secondary | ICD-10-CM

## 2018-01-02 DIAGNOSIS — M899 Disorder of bone, unspecified: Secondary | ICD-10-CM | POA: Diagnosis not present

## 2018-01-02 DIAGNOSIS — Z7951 Long term (current) use of inhaled steroids: Secondary | ICD-10-CM | POA: Diagnosis not present

## 2018-01-02 DIAGNOSIS — Z7902 Long term (current) use of antithrombotics/antiplatelets: Secondary | ICD-10-CM | POA: Insufficient documentation

## 2018-01-02 DIAGNOSIS — E782 Mixed hyperlipidemia: Secondary | ICD-10-CM | POA: Insufficient documentation

## 2018-01-02 DIAGNOSIS — F329 Major depressive disorder, single episode, unspecified: Secondary | ICD-10-CM | POA: Diagnosis not present

## 2018-01-02 DIAGNOSIS — Z8249 Family history of ischemic heart disease and other diseases of the circulatory system: Secondary | ICD-10-CM | POA: Diagnosis not present

## 2018-01-02 DIAGNOSIS — I110 Hypertensive heart disease with heart failure: Secondary | ICD-10-CM | POA: Diagnosis not present

## 2018-01-02 DIAGNOSIS — Z87891 Personal history of nicotine dependence: Secondary | ICD-10-CM | POA: Insufficient documentation

## 2018-01-02 DIAGNOSIS — I2510 Atherosclerotic heart disease of native coronary artery without angina pectoris: Secondary | ICD-10-CM | POA: Insufficient documentation

## 2018-01-02 DIAGNOSIS — M79642 Pain in left hand: Secondary | ICD-10-CM

## 2018-01-02 DIAGNOSIS — Z79899 Other long term (current) drug therapy: Secondary | ICD-10-CM

## 2018-01-02 DIAGNOSIS — L408 Other psoriasis: Secondary | ICD-10-CM | POA: Diagnosis not present

## 2018-01-02 DIAGNOSIS — Z789 Other specified health status: Secondary | ICD-10-CM | POA: Diagnosis not present

## 2018-01-02 DIAGNOSIS — Z7952 Long term (current) use of systemic steroids: Secondary | ICD-10-CM | POA: Insufficient documentation

## 2018-01-02 DIAGNOSIS — Z791 Long term (current) use of non-steroidal anti-inflammatories (NSAID): Secondary | ICD-10-CM | POA: Diagnosis not present

## 2018-01-02 DIAGNOSIS — M25562 Pain in left knee: Secondary | ICD-10-CM | POA: Diagnosis not present

## 2018-01-02 DIAGNOSIS — E559 Vitamin D deficiency, unspecified: Secondary | ICD-10-CM | POA: Insufficient documentation

## 2018-01-02 DIAGNOSIS — G3184 Mild cognitive impairment, so stated: Secondary | ICD-10-CM | POA: Diagnosis not present

## 2018-01-02 DIAGNOSIS — Z7982 Long term (current) use of aspirin: Secondary | ICD-10-CM | POA: Insufficient documentation

## 2018-01-02 DIAGNOSIS — G894 Chronic pain syndrome: Secondary | ICD-10-CM | POA: Diagnosis not present

## 2018-01-02 DIAGNOSIS — E119 Type 2 diabetes mellitus without complications: Secondary | ICD-10-CM | POA: Insufficient documentation

## 2018-01-02 DIAGNOSIS — M25561 Pain in right knee: Secondary | ICD-10-CM

## 2018-01-02 DIAGNOSIS — Z5181 Encounter for therapeutic drug level monitoring: Secondary | ICD-10-CM | POA: Diagnosis not present

## 2018-01-02 DIAGNOSIS — M79641 Pain in right hand: Secondary | ICD-10-CM | POA: Insufficient documentation

## 2018-01-02 DIAGNOSIS — G8929 Other chronic pain: Secondary | ICD-10-CM | POA: Insufficient documentation

## 2018-01-02 DIAGNOSIS — I509 Heart failure, unspecified: Secondary | ICD-10-CM | POA: Insufficient documentation

## 2018-01-02 DIAGNOSIS — J449 Chronic obstructive pulmonary disease, unspecified: Secondary | ICD-10-CM | POA: Diagnosis not present

## 2018-01-02 NOTE — Progress Notes (Signed)
Safety precautions to be maintained throughout the outpatient stay will include: orient to surroundings, keep bed in low position, maintain call bell within reach at all times, provide assistance with transfer out of bed and ambulation.  

## 2018-01-02 NOTE — Progress Notes (Signed)
Patient's Name: Nicholas Reyes  MRN: [id number]  Referring Provider: Orland Mustard *  DOB: [birth year]  PCP: McLean-Scocuzza, Nino Glow, MD  DOS: 01/02/2018  Note by: Dionisio David NP  Service setting: Ambulatory outpatient  Specialty: Interventional Pain Management  Location: [facility] (AMB) Pain Management Facility    Patient type: New Patient    Primary Reason(s) for Visit: Initial Patient Evaluation CC: Knee Pain (bilateral) and Hand Pain (bilateral)  HPI  Mr. Tagg is a 62 y.o. year old, male patient, who comes today for an initial evaluation. He has Benign essential hypertension; Coronary atherosclerosis; Psoriasis; Skin sore; Chronic airway obstruction, not elsewhere classified; Diabetes mellitus without complication ([place]); Vitamin D deficiency; Arthritis; DM2 (diabetes mellitus, type 2) ([place]); Cognitive impairment; Mixed hyperlipidemia; Allergic rhinitis; Asthma; Benign neoplasm of colon; Chest pain; Depressive disorder, not elsewhere classified; Diarrhea; Dyspnea; Edema; Exercise-induced angina ([place]); Family history of ischemic heart disease (IHD); Headaches due to old head injury; Infective otitis externa; Otogenic otalgia; Type 2 diabetes mellitus without complications ([place]); HTN (hypertension); Essential hypertension; Hypertension; Other psoriasis; Chronic pain of both knees (Primary Area of Pain) (R>L); Pain in both hands (Secondary Area of Pain) (L>R); Chronic pain syndrome; Long term current use of opiate analgesic; Pharmacologic therapy; Disorder of skeletal system; and Problems influencing health status on their problem list.. His primarily concern today is the Knee Pain (bilateral) and Hand Pain (bilateral)  Pain Assessment: Location: Right, Left Knee Radiating: denies Onset: More than a month ago Duration: Chronic pain Quality: Sharp, Dull Severity: 7 /10 (subjective, self-reported pain score)  Note: Reported level is compatible with observation. Clinically the patient looks like  a 2/10 A 2/10 is viewed as "Mild to Moderate" and described as noticeable and distracting. Impossible to hide from other people. More frequent flare-ups. Still possible to adapt and function close to normal. It can be very annoying and may have occasional stronger flare-ups. With discipline, patients may get used to it and adapt. Information on the proper use of the pain scale provided to the patient today. When using our objective Pain Scale, levels between 6 and 10/10 are said to belong in an emergency room, as it progressively worsens from a 6/10, described as severely limiting, requiring emergency care not usually available at an outpatient pain management facility. At a 6/10 level, communication becomes difficult and requires great effort. Assistance to reach the emergency department may be required. Facial flushing and profuse sweating along with potentially dangerous increases in heart rate and blood pressure will be evident. Effect on ADL: difficulty doing things at home Timing: Intermittent Modifying factors: nothing BP: 132/74  HR: 81  Onset and Duration: Present longer than 3 months Cause of pain: arthritis Severity: NAS-11 at its worse: 10/10 Timing: Night Aggravating Factors: Bending, Kneeling, Lifiting, Prolonged standing, Squatting, Stooping , Surgery made it worse, Twisting, Walking, Walking uphill, Walking downhill and Working Alleviating Factors: did not address this Associated Problems: Swelling, Pain that wakes patient up and Pain that does not allow patient to sleep Quality of Pain: Agonizing, Burning, Cramping, Nagging, Sharp, Superficial, Throbbing and Toothache-like Previous Examinations or Tests: X-rays and Orthopedic evaluation Previous Treatments: Chiropractic manipulations and Physical Therapy  The patient comes into the clinics today for the first time for a chronic pain management evaluation.  Cording to the patient his primary area of pain is in his knees.  He admits  that the right is greater than the left.  He denies any precipitating factors.  He admits that he does have swelling and  tingling in his knees.  He has slight weakness in his right knee.  He admits that he did have arthroscopic surgery while living in [place].  He admits that he has had interventional steroid injections which were effective for 1 to 2 days.  He admits that he has completed physical therapy on 2 different occasions approximately 2 years ago.  He admits that it was effective for his pain management.  He continues to do home exercises.  He admits that he has had some recent x-rays.  Second area of pain is in his hands he admits the right is greater than the left.  He admits the left hand pain has been going on for greater than a month.  The right hand pain started on Sunday.  He woke up his whole hand was swollen.  He admits he was seen in the ER x-rays were completed.  He admits that they were negative.  He was sent home with an Ace wrap which helped the swelling.  He denies any previous interventional therapy for the hand, surgery or physical therapy.  Today I took the time to provide the patient with information regarding this pain practice. The patient was informed that the practice is divided into two sections: an interventional pain management section, as well as a completely separate and distinct medication management section. I explained that there are procedure days for interventional therapies, and evaluation days for follow-ups and medication management. Because of the amount of documentation required during both, they are kept separated. This means that there is the possibility that he may be scheduled for a procedure on one day, and medication management the next. I have also informed him that because of staffing and facility limitations, this practice will no longer take patients for medication management only. To illustrate the reasons for this, I gave the patient the example of  surgeons, and how inappropriate it would be to refer a patient to his/her care, just to write for the post-surgical antibiotics on a surgery done by a different surgeon.   Because interventional pain management is part of the board-certified specialty for the doctors, the patient was informed that joining this practice means that they are open to any and all interventional therapies. I made it clear that this does not mean that they will be forced to have any procedures done. What this means is that I believe interventional therapies to be essential part of the diagnosis and proper management of chronic pain conditions. Therefore, patients not interested in these interventional alternatives will be better served under the care of a different practitioner.  The patient was also made aware of my Comprehensive Pain Management Safety Guidelines where by joining this practice, they limit all of their nerve blocks and joint injections to those done by our practice, for as long as we are retained to manage their care. Historic Controlled Substance Pharmacotherapy Review  PMP and historical list of controlled substances: Tramadol 50 mg, acetaminophen with codeine No. 3, oxycodone/acetaminophen 7.5/325 mg, Lyrica 75 mg, hydrocodone/acetaminophen 5/325 mg, Tylenol/codeine 120/12 mg per 5 mL's, oxycodone-acetaminophen 10/325 mg, diphenoxylate/atropine 2.5/0.025 mg, Highest opioid analgesic regimen found: Oxycodone/acetaminophen 7.5/325 mg 1 tablet 4 times daily (fill date 06/02/2017) oxycodone 80 mg/day Most recent opioid analgesic: Tramadol 50 mg 1 tablet 4 times daily (fill date 09/18/2017) tramadol 200 mg/day Current opioid analgesics: None Highest recorded MME/day: 45 mg/day MME/day: 0 mg/day Medications: The patient did not bring the medication(s) to the appointment, as requested in our "New Patient Package"  Pharmacodynamics: Desired effects: Analgesia: The patient reports >50% benefit. Reported improvement  in function: The patient reports medication allows him to accomplish basic ADLs. Clinically meaningful improvement in function (CMIF): Sustained CMIF goals met Perceived effectiveness: Described as relatively effective, allowing for increase in activities of daily living (ADL) Undesirable effects: Side-effects or Adverse reactions: None reported Historical Monitoring: The patient  reports that he does not use drugs. List of all UDS Test(s): No results found for: MDMA, COCAINSCRNUR, PCPSCRNUR, PCPQUANT, CANNABQUANT, THCU, [place] List of all Serum Drug Screening Test(s):  No results found for: AMPHSCRSER, BARBSCRSER, BENZOSCRSER, COCAINSCRSER, PCPSCRSER, PCPQUANT, THCSCRSER, CANNABQUANT, OPIATESCRSER, OXYSCRSER, PROPOXSCRSER Historical Background Evaluation: [place] PDMP: Six (6) year initial data search conducted.             [place] Department of public safety, offender search: Editor, commissioning Information) Non-contributory Risk Assessment Profile: Aberrant behavior: None observed or detected today Risk factors for fatal opioid overdose: caucasian and did not finish high school Fatal overdose hazard ratio (HR): Calculation deferred Non-fatal overdose hazard ratio (HR): Calculation deferred Risk of opioid abuse or dependence: 0.7-3.0% with doses ? 36 MME/day and 6.1-26% with doses ? 120 MME/day. Substance use disorder (SUD) risk level: Pending results of Medical Psychology Evaluation for SUD Opioid risk tool (ORT) (Total Score): 1  ORT Scoring interpretation table:  Score <3 = Low Risk for SUD  Score between 4-7 = Moderate Risk for SUD  Score >8 = High Risk for Opioid Abuse   PHQ-2 Depression Scale:  Total score: 0  PHQ-2 Scoring interpretation table: (Score and probability of major depressive disorder)  Score 0 = No depression  Score 1 = 15.4% Probability  Score 2 = 21.1% Probability  Score 3 = 38.4% Probability  Score 4 = 45.5% Probability  Score 5 = 56.4% Probability  Score 6 = 78.6% Probability    PHQ-9 Depression Scale:  Total score: 0  PHQ-9 Scoring interpretation table:  Score 0-4 = No depression  Score 5-9 = Mild depression  Score 10-14 = Moderate depression  Score 15-19 = Moderately severe depression  Score 20-27 = Severe depression (2.4 times higher risk of SUD and 2.89 times higher risk of overuse)   Pharmacologic Plan: Pending ordered tests and/or consults  Meds  The patient has a current medication list which includes the following prescription(s): albuterol, amlodipine, aspirin, atorvastatin, budesonide-formoterol, cetirizine, cholecalciferol, clopidogrel, diclofenac, doxycycline, duloxetine, furosemide, gabapentin, hydroxyzine, isosorbide mononitrate, ketorolac, meloxicam, metformin, metoprolol tartrate, montelukast, mupirocin ointment, nitroglycerin, ondansetron, pantoprazole, prednisolone acetate, prednisone, ranitidine, secukinumab, spironolactone, triamcinolone cream, loperamide, and metformin.  Current Outpatient Medications on File Prior to Visit  Medication Sig  . albuterol (PROVENTIL HFA;VENTOLIN HFA) 108 (90 BASE) MCG/ACT inhaler Inhale 2 puffs into the lungs every 4 (four) hours as needed. For shortness of breath  . amLODipine (NORVASC) 5 MG tablet Take 5 mg by mouth daily.  Marland Kitchen aspirin 81 MG EC tablet Take 81 mg by mouth daily.   Marland Kitchen atorvastatin (LIPITOR) 20 MG tablet Take 20 mg by mouth daily.  . budesonide-formoterol (SYMBICORT) 80-4.5 MCG/ACT inhaler Inhale 2 puffs into the lungs 2 (two) times daily.  . cetirizine (ZYRTEC) 10 MG tablet Take 10 mg by mouth daily.  . Cholecalciferol 50000 units capsule Take 1 capsule (50,000 Units total) by mouth once a week.  . clopidogrel (PLAVIX) 75 MG tablet Take 75 mg by mouth daily.  . diclofenac (FLECTOR) 1.3 % PTCH Place 1 patch onto the skin 2 (two) times daily.  Marland Kitchen doxycycline (VIBRA-TABS) 100 MG tablet Take 1 tablet (  100 mg total) by mouth 2 (two) times daily. With food  . DULoxetine (CYMBALTA) 60 MG capsule Take 60  mg by mouth daily.  . furosemide (LASIX) 40 MG tablet Take 40 mg by mouth daily.  Marland Kitchen gabapentin (NEURONTIN) 300 MG capsule Take 300-600 mg by mouth 2 (two) times daily. 300 mg qd and 600 mg qhs  . hydrOXYzine (ATARAX/VISTARIL) 25 MG tablet Take 1 tablet (25 mg total) by mouth 3 (three) times daily as needed.  . isosorbide mononitrate (IMDUR) 30 MG 24 hr tablet Take 30 mg by mouth daily. Per pharmacy taking 20 mg qd  . ketorolac (ACULAR) 0.5 % ophthalmic solution   . meloxicam (MOBIC) 7.5 MG tablet Take 1 tablet (7.5 mg total) by mouth 2 (two) times daily as needed for pain.  . metFORMIN (GLUCOPHAGE) 500 MG tablet Take 1 tablet (500 mg total) by mouth daily with breakfast.  . metoprolol tartrate (LOPRESSOR) 25 MG tablet Take 25 mg by mouth 2 (two) times daily.  . montelukast (SINGULAIR) 10 MG tablet Take 10 mg by mouth at bedtime.  . mupirocin ointment (BACTROBAN) 2 % Apply to affected area 3 times daily  . nitroGLYCERIN (NITROSTAT) 0.4 MG SL tablet Place 0.4 mg under the tongue every 5 (five) minutes as needed. For chest pain  . ondansetron (ZOFRAN) 4 MG tablet Take 1 tablet (4 mg total) by mouth every 8 (eight) hours as needed for nausea or vomiting.  . pantoprazole (PROTONIX) 40 MG tablet Take 40 mg by mouth daily.  . prednisoLONE acetate (PRED FORTE) 1 % ophthalmic suspension   . predniSONE (DELTASONE) 20 MG tablet Take 1 tablet (20 mg total) by mouth daily with breakfast.  . ranitidine (ZANTAC) 150 MG/10ML syrup 150 mg 2 (two) times daily. Taking 10 ml bid  . Secukinumab (COSENTYX 300 DOSE [place]) Inject into the skin every 30 (thirty) days.  Marland Kitchen spironolactone (ALDACTONE) 25 MG tablet Take 25 mg by mouth daily.  Marland Kitchen triamcinolone cream (KENALOG) 0.1 % Apply 1 application topically 2 (two) times daily. Not face, groin, underarms  . loperamide (IMODIUM A-D) 2 MG tablet Take 1 tablet (2 mg total) by mouth 4 (four) times daily as needed for diarrhea or loose stools. (Patient not taking: Reported on  01/02/2018)  . metFORMIN (GLUCOPHAGE) 500 MG tablet Take 1 tablet (500 mg total) by mouth daily with breakfast. (Patient not taking: Reported on 01/02/2018)   No current facility-administered medications on file prior to visit.    Imaging Review  Knee Imaging:  Knee-R DG 4 views:  Results for orders placed in visit on 11/15/17  DG Knee Complete 4 Views Right   Narrative CLINICAL DATA:  62 year old male with bilateral chronic knee pain, worse on the right than the left.  EXAM: RIGHT KNEE - COMPLETE 4+ VIEW  COMPARISON:  Concurrently obtained radiographs of the left knee; prior radiographs of the right knee 11/15/2006  FINDINGS: Mild degenerative changes present in the patellofemoral compartment with narrowing of the joint space and early patellar osteophyte formation. At least moderate degenerative changes are present in the medial compartment with more significant narrowing of the joint space, subchondral sclerosis and osteophyte formation. Changes have progressed compared to 11/15/2006. Minimal changes present in the lateral compartment. No evidence of joint effusion, fracture or malalignment. The soft tissues are unremarkable.  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 There has been progression of the degenerative changes compared to the prior radiograph from 07/10/208.   Electronically Signed   By: Myrle Sheng  Laurence Ferrari M.D.   On: 11/16/2017 08:25    Knee-L DG 4 views:  Results for orders placed in visit on 11/15/17  DG Knee Complete 4 Views Left   Narrative CLINICAL DATA:  61 year old male with chronic bilateral knee pain, worse on the right than the left.  EXAM: LEFT KNEE - COMPLETE 4+ VIEW  COMPARISON:  Concurrently obtained radiographs of the right knee  FINDINGS: No evidence of acute fracture or malalignment. Mild degenerative changes in the patellofemoral and medial compartments with narrowing of the joint space  and subtle osteophyte formation. Bony mineralization appears to be normal. No lytic or blastic osseous lesions. No evidence of knee joint effusion. The soft tissues are unremarkable.  IMPRESSION: 1. Mild degenerative osteoarthritis in the patellofemoral and medial compartments. 2. No evidence of fracture, joint effusion or bony lesion.   Electronically Signed   By: Jacqulynn Cadet M.D.   On: 11/16/2017 08:23    Note: Available results from prior imaging studies were reviewed.        ROS  Cardiovascular History: Heart trouble, Daily Aspirin intake, High blood pressure, Heart attack ( Date: unknown) and Heart surgery Pulmonary or Respiratory History: Lung problems, Difficulty blowing air out (Emphysema), Shortness of breath and Temporary stoppage of breathing during sleep Neurological History: No reported neurological signs or symptoms such as seizures, abnormal skin sensations, urinary and/or fecal incontinence, being born with an abnormal open spine and/or a tethered spinal cord Review of Past Neurological Studies:  Results for orders placed or performed during the hospital encounter of 04/14/17  CT Head Wo Contrast   Narrative   CLINICAL DATA:  Migraine since last night, worse headache of life, history diabetes mellitus  EXAM: CT HEAD WITHOUT CONTRAST  TECHNIQUE: Contiguous axial images were obtained from the base of the skull through the vertex without intravenous contrast. Sagittal and coronal MPR images reconstructed from axial data set.  COMPARISON:  MR brain 09/13/2011  FINDINGS: Brain: Mild generalized atrophy. Normal ventricular morphology. No midline shift or mass effect. Otherwise normal appearance of brain parenchyma. No intracranial hemorrhage, mass lesion or evidence of acute infarction. No extra-axial fluid collections.  Vascular: Unremarkable  Skull: Intact  Sinuses/Orbits: Clear  Other: N/A  IMPRESSION: Mild generalized atrophy.  No acute  intracranial abnormalities.   Electronically Signed   By: Lavonia Dana M.D.   On: 04/14/2017 13:51    Psychological-Psychiatric History: Depressed Gastrointestinal History: No reported gastrointestinal signs or symptoms such as vomiting or evacuating blood, reflux, heartburn, alternating episodes of diarrhea and constipation, inflamed or scarred liver, or pancreas or irrregular and/or infrequent bowel movements Genitourinary History: No reported renal or genitourinary signs or symptoms such as difficulty voiding or producing urine, peeing blood, non-functioning kidney, kidney stones, difficulty emptying the bladder, difficulty controlling the flow of urine, or chronic kidney disease Hematological History: Bleeding easily Endocrine History: High blood sugar controlled without the use of insulin (NIDDM) Rheumatologic History: No reported rheumatological signs and symptoms such as fatigue, joint pain, tenderness, swelling, redness, heat, stiffness, decreased range of motion, with or without associated rash Musculoskeletal History: Negative for myasthenia gravis, muscular dystrophy, multiple sclerosis or malignant hyperthermia Work History: Disabled  Allergies  Mr. Halpin has No Known Allergies.  Laboratory Chemistry  Inflammation Markers Lab Results  Component Value Date   CRP 1.1 11/15/2017   ESRSEDRATE 12 11/15/2017   (CRP: Acute Phase) (ESR: Chronic Phase) Renal Function Markers Lab Results  Component Value Date   BUN 11 12/30/2017   CREATININE 0.92 12/30/2017  GFRAA >60 12/30/2017   GFRNONAA >60 12/30/2017   Hepatic Function Markers Lab Results  Component Value Date   AST 13 11/15/2017   ALT 13 11/15/2017   ALBUMIN 4.2 11/15/2017   ALKPHOS 78 11/15/2017   Electrolytes Lab Results  Component Value Date   NA 138 12/30/2017   K 3.7 12/30/2017   CL 99 12/30/2017   CALCIUM 9.0 12/30/2017   Neuropathy Markers No results found for: [id number] Bone Pathology  Markers Lab Results  Component Value Date   ALKPHOS 78 11/15/2017   VD25OH 11.36 (L) 11/16/2017   CALCIUM 9.0 12/30/2017   Coagulation Parameters Lab Results  Component Value Date   PLT 169 12/30/2017   Cardiovascular Markers Lab Results  Component Value Date   HGB 13.4 12/30/2017   HCT 40.5 12/30/2017   Note: Lab results reviewed.  PFSH  Drug: Mr. Seltzer  reports that he does not use drugs. Alcohol:  reports that he does not drink alcohol. Tobacco:  reports that he has quit smoking. He has never used smokeless tobacco. Medical:  has a past medical history of Arthritis, CAD (coronary artery disease), COPD (chronic obstructive pulmonary disease) ([place]), Depression, Diabetes mellitus, Heart disease, Hypertension, Migraines, and Psoriasis. Family: family history includes Cancer in his mother; Heart disease in his father; Stroke in his father.  Past Surgical History:  Procedure Laterality Date  . APPENDECTOMY    . CATARACT EXTRACTION Bilateral    inserted lens b/l eyes right 03/12/17, left 05/28/17   . CHOLECYSTECTOMY    . CORONARY ANGIOPLASTY WITH STENT PLACEMENT     LAD, LCX-OM, ? 3rd location  . EYE SURGERY     cataract b/l   . HEMORRHOID SURGERY    . hemorrhoid surgery      x 2   . KNEE SURGERY     right knee arthroscopy, partial synovectecomy 04/21/15 and meniscectomy/chondroplasty   . right knee surgery     ? type of procedure   . STENT PLACEMENT VASCULAR ([facility])    . TONSILLECTOMY    . TONSILLECTOMY AND ADENOIDECTOMY     Active Ambulatory Problems    Diagnosis Date Noted  . Benign essential hypertension 08/02/2012  . Coronary atherosclerosis 09/02/2012  . Psoriasis 11/15/2017  . Skin sore 11/15/2017  . Chronic airway obstruction, not elsewhere classified 08/02/2012  . Diabetes mellitus without complication ([place]) 25/49/8264  . Vitamin D deficiency 11/15/2017  . Arthritis 06/09/2010  . DM2 (diabetes mellitus, type 2) ([place]) 12/04/2017  . Cognitive impairment  12/11/2017  . Mixed hyperlipidemia 04/02/2013  . Allergic rhinitis 07/05/2012  . Asthma 06/09/2010  . Benign neoplasm of colon 09/22/2010  . Chest pain 12/12/2012  . Depressive disorder, not elsewhere classified 08/02/2012  . Diarrhea 06/09/2010  . Dyspnea 12/12/2012  . Edema 02/09/2014  . Exercise-induced angina ([place]) 01/31/2016  . Family history of ischemic heart disease (IHD) 07/08/2012  . Headaches due to old head injury 06/09/2010  . Infective otitis externa 09/25/2012  . Otogenic otalgia 09/02/2012  . Type 2 diabetes mellitus without complications ([place]) 15/83/0940  . HTN (hypertension) 04/02/2013  . Essential hypertension 06/11/2017  . Hypertension 05/18/2014  . Other psoriasis 07/05/2012  . Chronic pain of both knees (Primary Area of Pain) (R>L) 01/02/2018  . Pain in both hands (Secondary Area of Pain) (L>R) 01/02/2018  . Chronic pain syndrome 01/02/2018  . Long term current use of opiate analgesic 01/02/2018  . Pharmacologic therapy 01/02/2018  . Disorder of skeletal system 01/02/2018  . Problems influencing health  status 01/02/2018   Resolved Ambulatory Problems    Diagnosis Date Noted  . No Resolved Ambulatory Problems   Past Medical History:  Diagnosis Date  . CAD (coronary artery disease)   . COPD (chronic obstructive pulmonary disease) ([place])   . Depression   . Diabetes mellitus   . Heart disease   . Migraines    Constitutional Exam  General appearance: Well nourished, well developed, and well hydrated. In no apparent acute distress Vitals:   01/02/18 1330  BP: 132/74  Pulse: 81  Resp: 16  Temp: 98.2 F (36.8 C)  TempSrc: Oral  SpO2: 98%  Weight: 240 lb (108.9 kg)  Height: 5' 7"  (1.702 m)   BMI Assessment: Estimated body mass index is 37.59 kg/m as calculated from the following:   Height as of this encounter: 5' 7"  (1.702 m).   Weight as of this encounter: 240 lb (108.9 kg).  BMI interpretation table: BMI level Category Range association with  higher incidence of chronic pain  <18 kg/m2 Underweight   18.5-24.9 kg/m2 Ideal body weight   25-29.9 kg/m2 Overweight Increased incidence by 20%  30-34.9 kg/m2 Obese (Class I) Increased incidence by 68%  35-39.9 kg/m2 Severe obesity (Class II) Increased incidence by 136%  >40 kg/m2 Extreme obesity (Class III) Increased incidence by 254%   BMI Readings from Last 4 Encounters:  01/02/18 37.59 kg/m  12/31/17 37.15 kg/m  12/30/17 36.50 kg/m  12/19/17 36.49 kg/m   Wt Readings from Last 4 Encounters:  01/02/18 240 lb (108.9 kg)  12/31/17 237 lb 3.2 oz (107.6 kg)  12/30/17 233 lb 0.4 oz (105.7 kg)  12/19/17 233 lb (105.7 kg)  Psych/Mental status: Alert, oriented x 3 (person, place, & time)       Eyes: PERLA Respiratory: No evidence of acute respiratory distress  Cervical Spine Exam  Inspection: No masses, redness, or swelling Alignment: Symmetrical Functional ROM: Unrestricted ROM      Stability: No instability detected Muscle strength & Tone: Functionally intact Sensory: Unimpaired Palpation: No palpable anomalies              Upper Extremity (UE) Exam    Side: Right upper extremity  Side: Left upper extremity  Inspection: No gross anomalies detected  Inspection: No gross anomalies detected  Functional ROM: Unrestricted ROM          Functional ROM: Unrestricted ROM          Muscle strength & Tone: Functionally intact  Muscle strength & Tone: Movement possible against some resistance (4/5)  Sensory: Unimpaired  Sensory: Unimpaired  Palpation: No palpable anomalies              Palpation: No palpable anomalies              Specialized Test(s): Deferred         Specialized Test(s): Deferred          Thoracic Spine Exam  Inspection: No masses, redness, or swelling Alignment: Symmetrical Functional ROM: Unrestricted ROM Stability: No instability detected Sensory: Unimpaired Muscle strength & Tone: No palpable anomalies  Lumbar Spine Exam  Inspection: No masses, redness, or  swelling Alignment: Symmetrical Functional ROM: Unrestricted ROM      Stability: No instability detected Muscle strength & Tone: Functionally intact Sensory: Unimpaired Palpation: No palpable anomalies       Provocative Tests: Lumbar Hyperextension and rotation test: evaluation deferred today       Patrick's Maneuver: evaluation deferred today  Gait & Posture Assessment  Ambulation: Unassisted Gait: Relatively normal for age and body habitus Posture: WNL   Lower Extremity Exam    Side: Right lower extremity  Side: Left lower extremity  Inspection: Edema plus pitting edema in lower extremity  Inspection: Pitting edema plus  Functional ROM: Adequate ROM          Functional ROM: Adequate ROM          Muscle strength & Tone: Functionally intact  Muscle strength & Tone: Functionally intact  Sensory: Unimpaired  Sensory: Unimpaired  Palpation: Complains of area being tender to palpation  Palpation: No palpable anomalies   Assessment  Primary Diagnosis & Pertinent Problem List: Diagnoses of Chronic pain of both knees (Primary Area of Pain) (R>L), Pain in both hands (Secondary Area of Pain) (L>R), Chronic pain syndrome, Long term current use of opiate analgesic, Pharmacologic therapy, Disorder of skeletal system, and Problems influencing health status were pertinent to this visit.  Visit Diagnosis: 1. Chronic pain of both knees (Primary Area of Pain) (R>L)   2. Pain in both hands (Secondary Area of Pain) (L>R)   3. Chronic pain syndrome   4. Long term current use of opiate analgesic   5. Pharmacologic therapy   6. Disorder of skeletal system   7. Problems influencing health status    Plan of Care  Initial treatment plan:  Please be advised that as per protocol, today's visit has been an evaluation only. We have not taken over the patient's controlled substance management.  Problem-specific plan: No problem-specific Assessment & Plan notes found for this  encounter.  Ordered Lab-work, Procedure(s), Referral(s), & Consult(s): Orders Placed This Encounter  Procedures  . Magnesium  . Vitamin B12  . Compliance Drug Analysis, Ur   Pharmacotherapy: Medications ordered:  No orders of the defined types were placed in this encounter.  Medications administered during this visit: Haskell Riling had no medications administered during this visit.   Pharmacotherapy under consideration:  Opioid Analgesics: The patient was informed that there is no guarantee that he would be a candidate for opioid analgesics. The decision will be made following CDC guidelines. This decision will be based on the results of diagnostic studies, as well as Mr. Weiand risk profile.  Membrane stabilizer: To be determined at a later time Muscle relaxant: To be determined at a later time NSAID: To be determined at a later time Other analgesic(s): To be determined at a later time   Interventional therapies under consideration: Mr. Westrup was informed that there is no guarantee that he would be a candidate for interventional therapies. The decision will be based on the results of diagnostic studies, as well as Mr. Trull risk profile.  Possible procedure(s): Diagnostic bilateral intra-articular knee injections Diagnostic Hyalgan series Diagnostic bilateral intra-articular hand injection   Provider-requested follow-up: Return for 2nd Visit, w/ Dr. Dossie Arbour.  Future Appointments  Date Time Provider [place]  01/28/2018  9:30 AM Milinda Pointer, MD [facility] None  02/27/2018  2:00 PM McLean-Scocuzza, Nino Glow, MD [facility]    Primary Care Physician: McLean-Scocuzza, Nino Glow, MD Location: [facility] Note by:  Date: 01/02/2018; Time: 4:45 PM  Pain Score Disclaimer: We use the NRS-11 scale. This is a self-reported, subjective measurement of pain severity with only modest accuracy. It is used primarily to identify changes  within a particular patient. It must be understood that outpatient pain scales are significantly less accurate that those used for research, where they can be  applied under ideal controlled circumstances with minimal exposure to variables. In reality, the score is likely to be a combination of pain intensity and pain affect, where pain affect describes the degree of emotional arousal or changes in action readiness caused by the sensory experience of pain. Factors such as social and work situation, setting, emotional state, anxiety levels, expectation, and prior pain experience may influence pain perception and show large inter-individual differences that may also be affected by time variables.  Patient instructions provided during this appointment: Patient Instructions   ____________________________________________________________________________________________  Appointment Policy Summary  It is our goal and responsibility to provide the medical community with assistance in the evaluation and management of patients with chronic pain. Unfortunately our resources are limited. Because we do not have an unlimited amount of time, or available appointments, we are required to closely monitor and manage their use. The following rules exist to maximize their use:  Patient's responsibilities: 1. Punctuality:  At what time should I arrive? You should be physically present in our office 30 minutes before your scheduled appointment. Your scheduled appointment is with your assigned healthcare provider. However, it takes 5-10 minutes to be "checked-in", and another 15 minutes for the nurses to do the admission. If you arrive to our office at the time you were given for your appointment, you will end up being at least 20-25 minutes late to your appointment with the provider. 2. Tardiness:  What happens if I arrive only a few minutes after my scheduled appointment time? You will need to reschedule your appointment.  The cutoff is your appointment time. This is why it is so important that you arrive at least 30 minutes before that appointment. If you have an appointment scheduled for 10:00 AM and you arrive at 10:01, you will be required to reschedule your appointment.  3. Plan ahead:  Always assume that you will encounter traffic on your way in. Plan for it. If you are dependent on a driver, make sure they understand these rules and the need to arrive early. 4. Other appointments and responsibilities:  Avoid scheduling any other appointments before or after your pain clinic appointments.  5. Be prepared:  Write down everything that you need to discuss with your healthcare provider and give this information to the admitting nurse. Write down the medications that you will need refilled. Bring your pills and bottles (even the empty ones), to all of your appointments, except for those where a procedure is scheduled. 6. No children or pets:  Find someone to take care of them. It is not appropriate to bring them in. 7. Scheduling changes:  We request "advanced notification" of any changes or cancellations. 8. Advanced notification:  Defined as a time period of more than 24 hours prior to the originally scheduled appointment. This allows for the appointment to be offered to other patients. 9. Rescheduling:  When a visit is rescheduled, it will require the cancellation of the original appointment. For this reason they both fall within the category of "Cancellations".  10. Cancellations:  They require advanced notification. Any cancellation less than 24 hours before the  appointment will be recorded as a "No Show". 11. No Show:  Defined as an unkept appointment where the patient failed to notify or declare to the practice their intention or inability to keep the appointment.  Corrective process for repeat offenders:  1. Tardiness: Three (3) episodes of rescheduling due to late arrivals will be recorded as one (1)  "No Show". 2. Cancellation or  reschedule: Three (3) cancellations or rescheduling will be recorded as one (1) "No Show". 3. "No Shows": Three (3) "No Shows" within a 12 month period will result in discharge from the practice. ____________________________________________________________________________________________  ____________________________________________________________________________________________  Pain Scale  Introduction: The pain score used by this practice is the Verbal Numerical Rating Scale (VNRS-11). This is an 11-point scale. It is for adults and children 10 years or older. There are significant differences in how the pain score is reported, used, and applied. Forget everything you learned in the past and learn this scoring system.  General Information: The scale should reflect your current level of pain. Unless you are specifically asked for the level of your worst pain, or your average pain. If you are asked for one of these two, then it should be understood that it is over the past 24 hours.  Basic Activities of Daily Living (ADL): Personal hygiene, dressing, eating, transferring, and using restroom.  Instructions: Most patients tend to report their level of pain as a combination of two factors, their physical pain and their psychosocial pain. This last one is also known as "suffering" and it is reflection of how physical pain affects you socially and psychologically. From now on, report them separately. From this point on, when asked to report your pain level, report only your physical pain. Use the following table for reference.  Pain Clinic Pain Levels (0-5/10)  Pain Level Score  Description  No Pain 0   Mild pain 1 Nagging, annoying, but does not interfere with basic activities of daily living (ADL). Patients are able to eat, bathe, get dressed, toileting (being able to get on and off the toilet and perform personal hygiene functions), transfer (move in and out of bed  or a chair without assistance), and maintain continence (able to control bladder and bowel functions). Blood pressure and heart rate are unaffected. A normal heart rate for a healthy adult ranges from 60 to 100 bpm (beats per minute).   Mild to moderate pain 2 Noticeable and distracting. Impossible to hide from other people. More frequent flare-ups. Still possible to adapt and function close to normal. It can be very annoying and may have occasional stronger flare-ups. With discipline, patients may get used to it and adapt.   Moderate pain 3 Interferes significantly with activities of daily living (ADL). It becomes difficult to feed, bathe, get dressed, get on and off the toilet or to perform personal hygiene functions. Difficult to get in and out of bed or a chair without assistance. Very distracting. With effort, it can be ignored when deeply involved in activities.   Moderately severe pain 4 Impossible to ignore for more than a few minutes. With effort, patients may still be able to manage work or participate in some social activities. Very difficult to concentrate. Signs of autonomic nervous system discharge are evident: dilated pupils (mydriasis); mild sweating (diaphoresis); sleep interference. Heart rate becomes elevated (>115 bpm). Diastolic blood pressure (lower number) rises above 100 mmHg. Patients find relief in laying down and not moving.   Severe pain 5 Intense and extremely unpleasant. Associated with frowning face and frequent crying. Pain overwhelms the senses.  Ability to do any activity or maintain social relationships becomes significantly limited. Conversation becomes difficult. Pacing back and forth is common, as getting into a comfortable position is nearly impossible. Pain wakes you up from deep sleep. Physical signs will be obvious: pupillary dilation; increased sweating; goosebumps; brisk reflexes; cold, clammy hands and feet; nausea, vomiting or dry  heaves; loss of appetite;  significant sleep disturbance with inability to fall asleep or to remain asleep. When persistent, significant weight loss is observed due to the complete loss of appetite and sleep deprivation.  Blood pressure and heart rate becomes significantly elevated. Caution: If elevated blood pressure triggers a pounding headache associated with blurred vision, then the patient should immediately seek attention at an urgent or emergency care unit, as these may be signs of an impending stroke.    Emergency Department Pain Levels (6-10/10)  Emergency Room Pain 6 Severely limiting. Requires emergency care and should not be seen or managed at an outpatient pain management facility. Communication becomes difficult and requires great effort. Assistance to reach the emergency department may be required. Facial flushing and profuse sweating along with potentially dangerous increases in heart rate and blood pressure will be evident.   Distressing pain 7 Self-care is very difficult. Assistance is required to transport, or use restroom. Assistance to reach the emergency department will be required. Tasks requiring coordination, such as bathing and getting dressed become very difficult.   Disabling pain 8 Self-care is no longer possible. At this level, pain is disabling. The individual is unable to do even the most "basic" activities such as walking, eating, bathing, dressing, transferring to a bed, or toileting. Fine motor skills are lost. It is difficult to think clearly.   Incapacitating pain 9 Pain becomes incapacitating. Thought processing is no longer possible. Difficult to remember your own name. Control of movement and coordination are lost.   The worst pain imaginable 10 At this level, most patients pass out from pain. When this level is reached, collapse of the autonomic nervous system occurs, leading to a sudden drop in blood pressure and heart rate. This in turn results in a temporary and dramatic drop in blood  flow to the brain, leading to a loss of consciousness. Fainting is one of the body's self defense mechanisms. Passing out puts the brain in a calmed state and causes it to shut down for a while, in order to begin the healing process.    Summary: 1. Refer to this scale when providing [place] with your pain level. 2. Be accurate and careful when reporting your pain level. This will help with your care. 3. Over-reporting your pain level will lead to loss of credibility. 4. Even a level of 1/10 means that there is pain and will be treated at our facility. 5. High, inaccurate reporting will be documented as "Symptom Exaggeration", leading to loss of credibility and suspicions of possible secondary gains such as obtaining more narcotics, or wanting to appear disabled, for fraudulent reasons. 6. Only pain levels of 5 or below will be seen at our facility. 7. Pain levels of 6 and above will be sent to the Emergency Department and the appointment cancelled. ____________________________________________________________________________________________    BMI Assessment: Estimated body mass index is 37.59 kg/m as calculated from the following:   Height as of this encounter: 5' 7"  (1.702 m).   Weight as of this encounter: 240 lb (108.9 kg).  BMI interpretation table: BMI level Category Range association with higher incidence of chronic pain  <18 kg/m2 Underweight   18.5-24.9 kg/m2 Ideal body weight   25-29.9 kg/m2 Overweight Increased incidence by 20%  30-34.9 kg/m2 Obese (Class I) Increased incidence by 68%  35-39.9 kg/m2 Severe obesity (Class II) Increased incidence by 136%  >40 kg/m2 Extreme obesity (Class III) Increased incidence by 254%   BMI Readings from Last 4 Encounters:  01/02/18  37.59 kg/m  12/31/17 37.15 kg/m  12/30/17 36.50 kg/m  12/19/17 36.49 kg/m   Wt Readings from Last 4 Encounters:  01/02/18 240 lb (108.9 kg)  12/31/17 237 lb 3.2 oz (107.6 kg)  12/30/17 233 lb 0.4 oz (105.7  kg)  12/19/17 233 lb (105.7 kg)

## 2018-01-02 NOTE — Patient Instructions (Signed)
____________________________________________________________________________________________  Appointment Policy Summary  It is our goal and responsibility to provide the medical community with assistance in the evaluation and management of patients with chronic pain. Unfortunately our resources are limited. Because we do not have an unlimited amount of time, or available appointments, we are required to closely monitor and manage their use. The following rules exist to maximize their use:  Patient's responsibilities: 1. Punctuality:  At what time should I arrive? You should be physically present in our office 30 minutes before your scheduled appointment. Your scheduled appointment is with your assigned healthcare provider. However, it takes 5-10 minutes to be "checked-in", and another 15 minutes for the nurses to do the admission. If you arrive to our office at the time you were given for your appointment, you will end up being at least 20-25 minutes late to your appointment with the provider. 2. Tardiness:  What happens if I arrive only a few minutes after my scheduled appointment time? You will need to reschedule your appointment. The cutoff is your appointment time. This is why it is so important that you arrive at least 30 minutes before that appointment. If you have an appointment scheduled for 10:00 AM and you arrive at 10:01, you will be required to reschedule your appointment.  3. Plan ahead:  Always assume that you will encounter traffic on your way in. Plan for it. If you are dependent on a driver, make sure they understand these rules and the need to arrive early. 4. Other appointments and responsibilities:  Avoid scheduling any other appointments before or after your pain clinic appointments.  5. Be prepared:  Write down everything that you need to discuss with your healthcare provider and give this information to the admitting nurse. Write down the medications that you will need  refilled. Bring your pills and bottles (even the empty ones), to all of your appointments, except for those where a procedure is scheduled. 6. No children or pets:  Find someone to take care of them. It is not appropriate to bring them in. 7. Scheduling changes:  We request "advanced notification" of any changes or cancellations. 8. Advanced notification:  Defined as a time period of more than 24 hours prior to the originally scheduled appointment. This allows for the appointment to be offered to other patients. 9. Rescheduling:  When a visit is rescheduled, it will require the cancellation of the original appointment. For this reason they both fall within the category of "Cancellations".  10. Cancellations:  They require advanced notification. Any cancellation less than 24 hours before the  appointment will be recorded as a "No Show". 11. No Show:  Defined as an unkept appointment where the patient failed to notify or declare to the practice their intention or inability to keep the appointment.  Corrective process for repeat offenders:  1. Tardiness: Three (3) episodes of rescheduling due to late arrivals will be recorded as one (1) "No Show". 2. Cancellation or reschedule: Three (3) cancellations or rescheduling will be recorded as one (1) "No Show". 3. "No Shows": Three (3) "No Shows" within a 12 month period will result in discharge from the practice. ____________________________________________________________________________________________  ____________________________________________________________________________________________  Pain Scale  Introduction: The pain score used by this practice is the Verbal Numerical Rating Scale (VNRS-11). This is an 11-point scale. It is for adults and children 10 years or older. There are significant differences in how the pain score is reported, used, and applied. Forget everything you learned in the past and learn  this scoring system.  General  Information: The scale should reflect your current level of pain. Unless you are specifically asked for the level of your worst pain, or your average pain. If you are asked for one of these two, then it should be understood that it is over the past 24 hours.  Basic Activities of Daily Living (ADL): Personal hygiene, dressing, eating, transferring, and using restroom.  Instructions: Most patients tend to report their level of pain as a combination of two factors, their physical pain and their psychosocial pain. This last one is also known as "suffering" and it is reflection of how physical pain affects you socially and psychologically. From now on, report them separately. From this point on, when asked to report your pain level, report only your physical pain. Use the following table for reference.  Pain Clinic Pain Levels (0-5/10)  Pain Level Score  Description  No Pain 0   Mild pain 1 Nagging, annoying, but does not interfere with basic activities of daily living (ADL). Patients are able to eat, bathe, get dressed, toileting (being able to get on and off the toilet and perform personal hygiene functions), transfer (move in and out of bed or a chair without assistance), and maintain continence (able to control bladder and bowel functions). Blood pressure and heart rate are unaffected. A normal heart rate for a healthy adult ranges from 60 to 100 bpm (beats per minute).   Mild to moderate pain 2 Noticeable and distracting. Impossible to hide from other people. More frequent flare-ups. Still possible to adapt and function close to normal. It can be very annoying and may have occasional stronger flare-ups. With discipline, patients may get used to it and adapt.   Moderate pain 3 Interferes significantly with activities of daily living (ADL). It becomes difficult to feed, bathe, get dressed, get on and off the toilet or to perform personal hygiene functions. Difficult to get in and out of bed or a chair  without assistance. Very distracting. With effort, it can be ignored when deeply involved in activities.   Moderately severe pain 4 Impossible to ignore for more than a few minutes. With effort, patients may still be able to manage work or participate in some social activities. Very difficult to concentrate. Signs of autonomic nervous system discharge are evident: dilated pupils (mydriasis); mild sweating (diaphoresis); sleep interference. Heart rate becomes elevated (>115 bpm). Diastolic blood pressure (lower number) rises above 100 mmHg. Patients find relief in laying down and not moving.   Severe pain 5 Intense and extremely unpleasant. Associated with frowning face and frequent crying. Pain overwhelms the senses.  Ability to do any activity or maintain social relationships becomes significantly limited. Conversation becomes difficult. Pacing back and forth is common, as getting into a comfortable position is nearly impossible. Pain wakes you up from deep sleep. Physical signs will be obvious: pupillary dilation; increased sweating; goosebumps; brisk reflexes; cold, clammy hands and feet; nausea, vomiting or dry heaves; loss of appetite; significant sleep disturbance with inability to fall asleep or to remain asleep. When persistent, significant weight loss is observed due to the complete loss of appetite and sleep deprivation.  Blood pressure and heart rate becomes significantly elevated. Caution: If elevated blood pressure triggers a pounding headache associated with blurred vision, then the patient should immediately seek attention at an urgent or emergency care unit, as these may be signs of an impending stroke.    Emergency Department Pain Levels (6-10/10)  Emergency Room Pain 6 Severely   limiting. Requires emergency care and should not be seen or managed at an outpatient pain management facility. Communication becomes difficult and requires great effort. Assistance to reach the emergency department  may be required. Facial flushing and profuse sweating along with potentially dangerous increases in heart rate and blood pressure will be evident.   Distressing pain 7 Self-care is very difficult. Assistance is required to transport, or use restroom. Assistance to reach the emergency department will be required. Tasks requiring coordination, such as bathing and getting dressed become very difficult.   Disabling pain 8 Self-care is no longer possible. At this level, pain is disabling. The individual is unable to do even the most "basic" activities such as walking, eating, bathing, dressing, transferring to a bed, or toileting. Fine motor skills are lost. It is difficult to think clearly.   Incapacitating pain 9 Pain becomes incapacitating. Thought processing is no longer possible. Difficult to remember your own name. Control of movement and coordination are lost.   The worst pain imaginable 10 At this level, most patients pass out from pain. When this level is reached, collapse of the autonomic nervous system occurs, leading to a sudden drop in blood pressure and heart rate. This in turn results in a temporary and dramatic drop in blood flow to the brain, leading to a loss of consciousness. Fainting is one of the body's self defense mechanisms. Passing out puts the brain in a calmed state and causes it to shut down for a while, in order to begin the healing process.    Summary: 1. Refer to this scale when providing [place] with your pain level. 2. Be accurate and careful when reporting your pain level. This will help with your care. 3. Over-reporting your pain level will lead to loss of credibility. 4. Even a level of 1/10 means that there is pain and will be treated at our facility. 5. High, inaccurate reporting will be documented as "Symptom Exaggeration", leading to loss of credibility and suspicions of possible secondary gains such as obtaining more narcotics, or wanting to appear disabled, for  fraudulent reasons. 6. Only pain levels of 5 or below will be seen at our facility. 7. Pain levels of 6 and above will be sent to the Emergency Department and the appointment cancelled. ____________________________________________________________________________________________    BMI Assessment: Estimated body mass index is 37.59 kg/m as calculated from the following:   Height as of this encounter: 5\' 7"  (1.702 m).   Weight as of this encounter: 240 lb (108.9 kg).  BMI interpretation table: BMI level Category Range association with higher incidence of chronic pain  <18 kg/m2 Underweight   18.5-24.9 kg/m2 Ideal body weight   25-29.9 kg/m2 Overweight Increased incidence by 20%  30-34.9 kg/m2 Obese (Class I) Increased incidence by 68%  35-39.9 kg/m2 Severe obesity (Class II) Increased incidence by 136%  >40 kg/m2 Extreme obesity (Class III) Increased incidence by 254%   BMI Readings from Last 4 Encounters:  01/02/18 37.59 kg/m  12/31/17 37.15 kg/m  12/30/17 36.50 kg/m  12/19/17 36.49 kg/m   Wt Readings from Last 4 Encounters:  01/02/18 240 lb (108.9 kg)  12/31/17 237 lb 3.2 oz (107.6 kg)  12/30/17 233 lb 0.4 oz (105.7 kg)  12/19/17 233 lb (105.7 kg)

## 2018-01-03 LAB — VITAMIN B12: Vitamin B-12: 407 pg/mL (ref 232–1245)

## 2018-01-03 LAB — MAGNESIUM: MAGNESIUM: 1.9 mg/dL (ref 1.6–2.3)

## 2018-01-06 LAB — COMPLIANCE DRUG ANALYSIS, UR

## 2018-01-10 ENCOUNTER — Telehealth: Payer: Self-pay | Admitting: Internal Medicine

## 2018-01-10 NOTE — Telephone Encounter (Signed)
Copied from [place] [id number]. Topic: Quick Communication - See Telephone Encounter >> Jan 10, 2018  3:13 PM Synthia Innocent wrote: CRM for notification. See Telephone encounter for: 01/10/18. Patient checking status of depression med, states he called the pharmacy and pharmacy is waiting on approval. Does not know name of medication. Please advise

## 2018-01-10 NOTE — Telephone Encounter (Signed)
Left message for patient to return call back. [facility] may obtain information about the medication.

## 2018-01-10 NOTE — Telephone Encounter (Signed)
rx request 

## 2018-01-11 ENCOUNTER — Other Ambulatory Visit: Payer: Self-pay | Admitting: Internal Medicine

## 2018-01-11 DIAGNOSIS — M1712 Unilateral primary osteoarthritis, left knee: Secondary | ICD-10-CM | POA: Diagnosis not present

## 2018-01-11 DIAGNOSIS — M1711 Unilateral primary osteoarthritis, right knee: Secondary | ICD-10-CM | POA: Diagnosis not present

## 2018-01-11 DIAGNOSIS — M25561 Pain in right knee: Secondary | ICD-10-CM | POA: Diagnosis not present

## 2018-01-11 DIAGNOSIS — I2510 Atherosclerotic heart disease of native coronary artery without angina pectoris: Secondary | ICD-10-CM

## 2018-01-11 DIAGNOSIS — M25562 Pain in left knee: Secondary | ICD-10-CM | POA: Diagnosis not present

## 2018-01-11 DIAGNOSIS — G8929 Other chronic pain: Secondary | ICD-10-CM | POA: Diagnosis not present

## 2018-01-11 DIAGNOSIS — F329 Major depressive disorder, single episode, unspecified: Secondary | ICD-10-CM

## 2018-01-11 DIAGNOSIS — F32A Depression, unspecified: Secondary | ICD-10-CM

## 2018-01-11 MED ORDER — ISOSORBIDE MONONITRATE ER 30 MG PO TB24: 30.0000 mg | ORAL_TABLET | Freq: Every day | ORAL | 3 refills | Status: AC

## 2018-01-11 MED ORDER — DULOXETINE HCL 60 MG PO CPEP: 60.0000 mg | ORAL_CAPSULE | Freq: Every day | ORAL | 3 refills | Status: AC

## 2018-01-11 NOTE — Telephone Encounter (Signed)
Patient came into office with bottles of empty medications he is requesting refill on Duloxetine DR 60 mg, Patient also requesting refill on isosorbide mononitrate 30 mg bottle patient came in with dated 09/11/16., patient states he has not had these medications in at least 3- 4 months.

## 2018-01-16 ENCOUNTER — Ambulatory Visit: Payer: Self-pay | Admitting: Internal Medicine

## 2018-01-16 NOTE — Telephone Encounter (Signed)
Pt called to report that he is having shortness of breath with exertion. Pt stated that his difficulty breathing comes and goes. Pt stated he had an episode of chest pain and after 5 minutes his nitroglycerin eased the pain. Pt asking if he needs oxygen due to his shortness of breath. Pt stated that he is not short of breath when resting. Pt also c/o runny nose, 3 days of diarrhea, cough, chest pain with walking. Pt stated he has chest pain occasionally at night. Care advice given and pt verbalized understanding. Appt made for tomorrow am at 8:00 am with Philis Nettle FNP.   Reason for Disposition . [1] Chest pain lasting <= 5 minutes AND [2] completely relieved by nitroglycerin . [1] MILD longstanding difficulty breathing AND [2]  SAME as normal  Answer Assessment - Initial Assessment Questions 1. RESPIRATORY STATUS: "Describe your breathing?" (e.g., wheezing, shortness of breath, unable to speak, severe coughing)      yesterday shortness of breath 2. ONSET: "When did this breathing problem begin?"      Yesterday and with exertion 3. PATTERN "Does the difficult breathing come and go, or has it been constant since it started?"      Comes and goes 4. SEVERITY: "How bad is your breathing?" (e.g., mild, moderate, severe)    - MILD: No SOB at rest, mild SOB with walking, speaks normally in sentences, can lay down, no retractions, pulse < 100.    - MODERATE: SOB at rest, SOB with minimal exertion and prefers to sit, cannot lie down flat, speaks in phrases, mild retractions, audible wheezing, pulse 100-120.    - SEVERE: Very SOB at rest, speaks in single words, struggling to breathe, sitting hunched forward, retractions, pulse > 120      mild 5. RECURRENT SYMPTOM: "Have you had difficulty breathing before?" If so, ask: "When was the last time?" and "What happened that time?"      Yes when he walks  And feels like he is having a heart attack when he walks  6. CARDIAC HISTORY: "Do you have any history of  heart disease?" (e.g., heart attack, angina, bypass surgery, angioplasty)      2 heart attacks 3 stents in heart 7. LUNG HISTORY: "Do you have any history of lung disease?"  (e.g., pulmonary embolus, asthma, emphysema)     Cough uses 2 inhalers and nebulizer 8. CAUSE: "What do you think is causing the breathing problem?"     Pt does not know 9. OTHER SYMPTOMS: "Do you have any other symptoms? (e.g., dizziness, runny nose, cough, chest pain, fever)     Runny nose, h/o dizziness, cough, chest pain with walking 10. PREGNANCY: "Is there any chance you are pregnant?" "When was your last menstrual period?"       n/a 11. TRAVEL: "Have you traveled out of the country in the last month?" (e.g., travel history, exposures)       no  Answer Assessment - Initial Assessment Questions 1. LOCATION: "Where does it hurt?"       Yesterday- left side of chest 2. RADIATION: "Does the pain go anywhere else?" (e.g., into neck, jaw, arms, back)     no 3. ONSET: "When did the chest pain begin?" (Minutes, hours or days)      Yesterday for 5 minutes and has occasionally at night 4. PATTERN "Does the pain come and go, or has it been constant since it started?"  "Does it get worse with exertion?"      Comes  and goes-gets worse with exertion 5. DURATION: "How long does it last" (e.g., seconds, minutes, hours)     5 minutes 6. SEVERITY: "How bad is the pain?"  (e.g., Scale 1-10; mild, moderate, or severe)    - MILD (1-3): doesn't interfere with normal activities     - MODERATE (4-7): interferes with normal activities or awakens from sleep    - SEVERE (8-10): excruciating pain, unable to do any normal activities       mild 7. CARDIAC RISK FACTORS: "Do you have any history of heart problems or risk factors for heart disease?" (e.g., prior heart attack, angina; high blood pressure, diabetes, being overweight, high cholesterol, smoking, or strong family history of heart disease)     3 stents, 2 heart attacks,high blood  pressure, diabetes,  8. PULMONARY RISK FACTORS: "Do you have any history of lung disease?"  (e.g., blood clots in lung, asthma, emphysema, birth control pills)    Pt stated "I dont know" 9. CAUSE: "What do you think is causing the chest pain?"     Walking 10. OTHER SYMPTOMS: "Do you have any other symptoms?" (e.g., dizziness, nausea, vomiting, sweating, fever, difficulty breathing, cough)       Diarrhea 2-3 days, shortness of breath when pt walks, cough, runny nose 11. PREGNANCY: "Is there any chance you are pregnant?" "When was your last menstrual period?"       n/a  Protocols used: CHEST PAIN-A-AH, BREATHING DIFFICULTY-A-AH

## 2018-01-17 ENCOUNTER — Ambulatory Visit: Payer: Medicare HMO | Admitting: Family Medicine

## 2018-01-17 ENCOUNTER — Telehealth: Payer: Self-pay | Admitting: Internal Medicine

## 2018-01-17 NOTE — Telephone Encounter (Signed)
Pt arrived for his 3:30 appointment today at 2:34pm. Pt was told that he was about hour too early. Pt wanted to be checked in. After waiting for about 20 minutes pt started to ask how soon he would be seen. Pt was told that his appt was not until 3:30. About 10 minutes later pt got up went to the rest room and left the office.

## 2018-01-18 ENCOUNTER — Ambulatory Visit: Payer: Medicare HMO | Admitting: Family Medicine

## 2018-01-21 ENCOUNTER — Ambulatory Visit: Payer: Self-pay

## 2018-01-21 DIAGNOSIS — M1711 Unilateral primary osteoarthritis, right knee: Secondary | ICD-10-CM | POA: Diagnosis not present

## 2018-01-21 DIAGNOSIS — M25562 Pain in left knee: Secondary | ICD-10-CM | POA: Diagnosis not present

## 2018-01-21 DIAGNOSIS — G8929 Other chronic pain: Secondary | ICD-10-CM | POA: Diagnosis not present

## 2018-01-21 DIAGNOSIS — M25561 Pain in right knee: Secondary | ICD-10-CM | POA: Diagnosis not present

## 2018-01-21 DIAGNOSIS — M1712 Unilateral primary osteoarthritis, left knee: Secondary | ICD-10-CM | POA: Diagnosis not present

## 2018-01-21 NOTE — Telephone Encounter (Signed)
Patient called in and says "I would like to make an appointment with Dr. Aundra Dubin for tomorrow, because I was having chest pain last night and took 3 nitroglycerin. I'm not having any pain now. I didn't go to the hospital because I didn't have gas to go." I asked the patient is he experiencing anything going on in his chest, SOB, tingling, discomfort, he denies. No availability with PCP, he agrees to see another provider, appointment scheduled for tomorrow, 01/22/18 at 1530 with Philis Nettle, FNP, care advice given, advised to call 911 if chest pain returns and no relief with nitroglycerin, patient verbalized understanding.  Reason for Disposition . Requesting regular office appointment  Answer Assessment - Initial Assessment Questions 1. REASON FOR CALL or QUESTION: "What is your reason for calling today?" or "How can I best help you?" or "What question do you have that I can help answer?"     I wanted to make an appointment to see Dr. Aundra Dubin because I was having chest pain last night and took 3 nitroglycerin.  Protocols used: INFORMATION ONLY CALL-A-AH

## 2018-01-22 ENCOUNTER — Ambulatory Visit (INDEPENDENT_AMBULATORY_CARE_PROVIDER_SITE_OTHER): Payer: Medicare HMO

## 2018-01-22 ENCOUNTER — Encounter: Payer: Self-pay | Admitting: Family Medicine

## 2018-01-22 ENCOUNTER — Ambulatory Visit (INDEPENDENT_AMBULATORY_CARE_PROVIDER_SITE_OTHER): Payer: Medicare HMO | Admitting: Family Medicine

## 2018-01-22 VITALS — BP 140/76 | HR 70 | Temp 98.6°F | Ht 67.0 in | Wt 237.0 lb

## 2018-01-22 DIAGNOSIS — I10 Essential (primary) hypertension: Secondary | ICD-10-CM | POA: Diagnosis not present

## 2018-01-22 DIAGNOSIS — R0609 Other forms of dyspnea: Secondary | ICD-10-CM

## 2018-01-22 DIAGNOSIS — R079 Chest pain, unspecified: Secondary | ICD-10-CM

## 2018-01-22 DIAGNOSIS — R0600 Dyspnea, unspecified: Secondary | ICD-10-CM | POA: Diagnosis not present

## 2018-01-22 DIAGNOSIS — I2510 Atherosclerotic heart disease of native coronary artery without angina pectoris: Secondary | ICD-10-CM | POA: Diagnosis not present

## 2018-01-22 MED ORDER — ALBUTEROL SULFATE (2.5 MG/3ML) 0.083% IN NEBU: 2.5000 mg | INHALATION_SOLUTION | Freq: Once | RESPIRATORY_TRACT | Status: DC

## 2018-01-22 NOTE — Progress Notes (Signed)
Subjective:    Patient ID: Nicholas Reyes, male    DOB: 23-Aug-1955, 62 y.o.   MRN: [id number]  HPI  Presents to clinic with CP and SOB for 2 months. States he has been taking a NTG tablet 1-3 times a day almost every day for 2 months. He has had MI in the past, states it was years ago but unsure of exact date.   He has hx of HTN, CAD, former smoker, Hyperlipidemia.   Patient Active Problem List   Diagnosis Date Noted  . Coronary artery disease involving native coronary artery of native heart without angina pectoris 01/22/2018  . Chronic pain of both knees (Primary Area of Pain) (R>L) 01/02/2018  . Pain in both hands (Secondary Area of Pain) (L>R) 01/02/2018  . Chronic pain syndrome 01/02/2018  . Long term current use of opiate analgesic 01/02/2018  . Pharmacologic therapy 01/02/2018  . Disorder of skeletal system 01/02/2018  . Problems influencing health status 01/02/2018  . Cognitive impairment 12/11/2017  . DM2 (diabetes mellitus, type 2) ([place]) 12/04/2017  . Psoriasis 11/15/2017  . Skin sore 11/15/2017  . Diabetes mellitus without complication ([place]) 92/03/9416  . Vitamin D deficiency 11/15/2017  . Essential hypertension 06/11/2017  . Exercise-induced angina ([place]) 01/31/2016  . Hypertension 05/18/2014  . Edema 02/09/2014  . Mixed hyperlipidemia 04/02/2013  . HTN (hypertension) 04/02/2013  . Chest pain in adult 12/12/2012  . Dyspnea 12/12/2012  . Infective otitis externa 09/25/2012  . Coronary atherosclerosis 09/02/2012  . Otogenic otalgia 09/02/2012  . Benign essential hypertension 08/02/2012  . Chronic airway obstruction, not elsewhere classified 08/02/2012  . Depressive disorder, not elsewhere classified 08/02/2012  . Family history of ischemic heart disease (IHD) 07/08/2012  . Allergic rhinitis 07/05/2012  . Other psoriasis 07/05/2012  . Benign neoplasm of colon 09/22/2010  . Arthritis 06/09/2010  . Asthma 06/09/2010  . Diarrhea 06/09/2010  . Headaches due to old  head injury 06/09/2010  . Type 2 diabetes mellitus without complications ([place]) 40/81/4481   Social History   Tobacco Use  . Smoking status: Former Research scientist (life sciences)  . Smokeless tobacco: Never Used  Substance Use Topics  . Alcohol use: No    Frequency: Never   Review of Systems  Constitutional: Negative for chills, fatigue and fever.  HENT: Negative for congestion, ear pain, sinus pain and sore throat.   Eyes: Negative.   Respiratory: +SOB. Negative wheeze or productive cough.    Cardiovascular: +chest pain  Gastrointestinal: Negative for abdominal pain, diarrhea, nausea and vomiting.  Genitourinary: Negative for dysuria, frequency and urgency.  Musculoskeletal: Negative for arthralgias and myalgias.  Skin: Negative for color change, pallor and rash.  Neurological: Negative for syncope, light-headedness and headaches.  Psychiatric/Behavioral: The patient is not nervous/anxious.       Objective:   Physical Exam  Constitutional: He is oriented to person, place, and time. No distress.  HENT:  Head: Normocephalic and atraumatic.  Eyes: No scleral icterus.  Neck: No JVD present. No tracheal deviation present.  Cardiovascular: Normal rate and regular rhythm.  Pulmonary/Chest: Effort normal. No stridor. No respiratory distress. He has no wheezes. He has no rales.  Musculoskeletal: He exhibits no edema.  Neurological: He is alert and oriented to person, place, and time.  Skin: Skin is warm and dry. He is not diaphoretic. No pallor.  Psychiatric: He has a normal mood and affect. His behavior is normal.  Nursing note and vitals reviewed.   Vitals:   01/22/18 1502  BP: 140/76  Pulse:  70  Temp: 98.6 F (37 C)  SpO2: 90%      Assessment & Plan:   Chest pain/CAD/HTN -- I personally reviewed EKG, it is abnormal with inverted T waves in aVR, aVL, V1, V2. No acute T wave abnormality seen. I also had Dr Mclean-Scocuzza view EKG and she does not see any acute changes. Our referral coordinator  was able to get patient urgent appt with Cardiology at [facility] clinic tomorrow 01/23/18  Dyspnea on Exertion -- Albuterol nebulizer given in clinic. CXR performed in clinic. Breathing improved after neb treatment.   Patient will see cardiology tomorrow. Advised if he has chest pain, worsening SOB to call EMS to go to ER right away.

## 2018-01-23 DIAGNOSIS — E782 Mixed hyperlipidemia: Secondary | ICD-10-CM | POA: Diagnosis not present

## 2018-01-23 DIAGNOSIS — E119 Type 2 diabetes mellitus without complications: Secondary | ICD-10-CM | POA: Diagnosis not present

## 2018-01-23 DIAGNOSIS — R079 Chest pain, unspecified: Secondary | ICD-10-CM | POA: Diagnosis not present

## 2018-01-27 NOTE — Progress Notes (Addendum)
Patient's Name: Nicholas Reyes  MRN: [id number]  Referring Provider: Orland Mustard *  DOB: 1955-06-18  PCP: McLean-Scocuzza, Nino Glow, MD  DOS: 01/28/2018  Note by: Gaspar Cola, MD  Service setting: Ambulatory outpatient  Specialty: Interventional Pain Management  Location: [facility] (AMB) Pain Management Facility    Patient type: Established   Primary Reason(s) for Visit: Encounter for evaluation before starting new chronic pain management plan of care (Level of risk: moderate) CC: Knee Pain (right)  HPI  Nicholas Reyes is a 62 y.o. year old, male patient, who comes today for a follow-up evaluation to review the test results and decide on a treatment plan. He has Benign essential hypertension; Coronary atherosclerosis; Psoriasis; Skin sore; Chronic airway obstruction, not elsewhere classified; Diabetes mellitus without complication ([place]); Vitamin D deficiency; Arthritis; DM2 (diabetes mellitus, type 2) ([place]); Cognitive impairment; Mixed hyperlipidemia; Allergic rhinitis; Asthma; Benign neoplasm of colon; Chest pain with high risk for cardiac etiology; Depressive disorder, not elsewhere classified; Diarrhea; Dyspnea; Edema; Exercise-induced angina ([place]); Family history of ischemic heart disease (IHD); Headaches due to old head injury; Infective otitis externa; Otalgia; Type 2 diabetes mellitus without complications ([place]); HTN (hypertension); Essential hypertension; Hypertension; Other psoriasis; Chronic knee pain (Primary Area of Pain) (Bilateral) (R>L); Chronic hand pain (Secondary Area of Pain) (Bilateral) (L>R); Chronic pain syndrome; Long term current use of opiate analgesic; Pharmacologic therapy; Disorder of skeletal system; Problems influencing health status; Coronary artery disease involving native coronary artery of native heart without angina pectoris; Osteoarthritis of knee (Bilateral) (R>L); Tricompartment osteoarthritis of knee (Right); Osteoarthritis of hands (Bilateral); Family history  of ischemic heart disease and other diseases of the circulatory system; Abdominal pain; and Chronic anticoagulation (PLAVIX) on their problem list. His primarily concern today is the Knee Pain (right)  Pain Assessment: Location: Right Knee Radiating: denies Onset: More than a month ago Duration: Chronic pain Quality: Larence Penning Severity: 8 /10 (subjective, self-reported pain score)  Note: Reported level is inconsistent with clinical observations. Clinically the patient looks like a 2/10 A 2/10 is viewed as "Mild to Moderate" and described as noticeable and distracting. Impossible to hide from other people. More frequent flare-ups. Still possible to adapt and function close to normal. It can be very annoying and may have occasional stronger flare-ups. With discipline, patients may get used to it and adapt. Information on the proper use of the pain scale provided to the patient today. When using our objective Pain Scale, levels between 6 and 10/10 are said to belong in an emergency room, as it progressively worsens from a 6/10, described as severely limiting, requiring emergency care not usually available at an outpatient pain management facility. At a 6/10 level, communication becomes difficult and requires great effort. Assistance to reach the emergency department may be required. Facial flushing and profuse sweating along with potentially dangerous increases in heart rate and blood pressure will be evident. Effect on ADL: Limits activities Timing: Constant Modifying factors: denies BP: 126/67  HR: 75  Nicholas Reyes comes in today for a follow-up visit after his initial evaluation on 01/02/2018. Today we went over the results of his tests. These were explained in "Layman's terms". During today's appointment we went over my diagnostic impression, as well as the proposed treatment plan.  According to the patient his primary area of pain is in his knees (B) (R>L).  He admits that the right is greater  than the left.  He denies any precipitating factors.  He admits that he does have swelling and tingling  in his knees.  He has slight weakness in his right knee.  He admits that he did have arthroscopic surgery on the right while living in [place], more than 10 yrs ago.  He admits that he has had interventional steroid injections which were effective for 1 to 2 days (had the last one 1-2 weeks ago) (Dr. Camillia Herter w/ [facility]).  He admits that he has completed physical therapy on 2 different occasions approximately 2 years ago.  He admits that it was effective for his pain management.  He continues to do home exercises.  He admits that he has had some recent x-rays. He was told that he would be having knee surgery on right knee (does not know when).  Second area of pain is in his hands (B) (R>L) he admits the right is greater than the left.  He admits the left hand pain has been going on for greater than a month (at the time of his initial evaluation).  The right hand pain started more recently.  He woke up his whole hand was swollen.  He admits he was seen in the ER x-rays were completed.  He admits that they were negative.  He was sent home with an Ace wrap which helped the swelling.  He denies any previous interventional therapy for the hand, surgery or physical therapy.  In considering the treatment plan options, Nicholas Reyes was reminded that I no longer take patients for medication management only. I asked him to let me know if he had no intention of taking advantage of the interventional therapies, so that we could make arrangements to provide this space to someone interested. I also made it clear that undergoing interventional therapies for the purpose of getting pain medications is very inappropriate on the part of a patient, and it will not be tolerated in this practice. This type of behavior would suggest true addiction and therefore it requires referral to an addiction specialist.   Further  details on both, my assessment(s), as well as the proposed treatment plan, please see below.  Controlled Substance Pharmacotherapy Assessment REMS (Risk Evaluation and Mitigation Strategy)  Analgesic: None Highest recorded MME/day: 45 mg/day MME/day: 0 mg/day Pill Count: None expected due to no prior prescriptions written by our practice. Dewayne Shorter, RN  01/28/2018  9:21 AM  Signed Safety precautions to be maintained throughout the outpatient stay will include: orient to surroundings, keep bed in low position, maintain call bell within reach at all times, provide assistance with transfer out of bed and ambulation.   Monitoring: [place] PMP: Online review of the past 45-monthperiod previously conducted. Not applicable at this point since we have not taken over the patient's medication management yet. List of other Serum/Urine Drug Screening Test(s):  No results found. List of all UDS test(s) done:  Lab Results  Component Value Date   SUMMARY FINAL 01/02/2018   Last UDS on record: Summary  Date Value Ref Range Status  01/02/2018 FINAL  Final    Comment:    ==================================================================== TOXASSURE COMP DRUG ANALYSIS,UR ==================================================================== Test                             Result       Flag       Units Drug Present and Declared for Prescription Verification   Hydroxyzine                    PRESENT  EXPECTED   Metoprolol                     PRESENT      EXPECTED Drug Present not Declared for Prescription Verification   Cyclobenzaprine                PRESENT      UNEXPECTED   Desmethylcyclobenzaprine       PRESENT      UNEXPECTED    Desmethylcyclobenzaprine is an expected metabolite of    cyclobenzaprine.   Acetaminophen                  PRESENT      UNEXPECTED Drug Absent but Declared for Prescription Verification   Gabapentin                     Not Detected UNEXPECTED   Duloxetine                      Not Detected UNEXPECTED   Diclofenac                     Not Detected UNEXPECTED    Diclofenac, as indicated in the declared medication list, is not    always detected even when used as directed.   Salicylate                     Not Detected UNEXPECTED    Aspirin, as indicated in the declared medication list, is not    always detected even when used as directed. ==================================================================== Test                      Result    Flag   Units      Ref Range   Creatinine              197              mg/dL      >=20 ==================================================================== Declared Medications:  The flagging and interpretation on this report are based on the  following declared medications.  Unexpected results may arise from  inaccuracies in the declared medications.  **Note: The testing scope of this panel includes these medications:  Duloxetine  Gabapentin  Hydroxyzine  Metoprolol  **Note: The testing scope of this panel does not include small to  moderate amounts of these reported medications:  Aspirin (Aspirin 81)  Diclofenac  **Note: The testing scope of this panel does not include following  reported medications:  Albuterol  Amlodipine Besylate  Atorvastatin  Budesonide (Symbicort)  Cetirizine  Cholecalciferol  Clopidogrel  Doxycycline  Formoterol (Symbicort)  Furosemide  Isosorbide Mononitrate  Ketorolac  Loperamide  Meloxicam  Metformin  Montelukast  Mupirocin  Nitroglycerin  Ondansetron  Pantoprazole  Prednisolone  Prednisone  Ranitidine  Secukinumab  Spironolactone  Triamcinolone acetonide ==================================================================== For clinical consultation, please call [phone]. ====================================================================    UDS interpretation: Unexpected findings not considered significantly abnormal.          Medication Assessment Form:  Not applicable. No opioids. Treatment compliance: Not applicable Risk Assessment Profile: Aberrant behavior: None observed or detected today Comorbid factors increasing risk of overdose: COPD or asthma Risk of substance use disorder (SUD): Low  ORT Scoring interpretation table:  Score <3 = Low Risk for SUD  Score between 4-7 = Moderate Risk for SUD  Score >8 = High Risk for Opioid Abuse  Risk Mitigation Strategies:  Patient opioid safety counseling: No controlled substances prescribed. Patient-Prescriber Agreement (PPA): No agreement signed.  Controlled substance notification to other providers: None required. No opioid therapy.  Pharmacologic Plan: No opioid analgesic prescribed.             Laboratory Chemistry  Inflammation Markers (CRP: Acute Phase) (ESR: Chronic Phase) Lab Results  Component Value Date   CRP 1.1 11/15/2017   ESRSEDRATE 12 11/15/2017   LATICACIDVEN 1.3 12/30/2017                         Rheumatology Markers Lab Results  Component Value Date   RF <14 11/15/2017   ANA NEGATIVE 11/15/2017                        Renal Function Markers Lab Results  Component Value Date   BUN 11 12/30/2017   CREATININE 0.92 12/30/2017   GFRAA >60 12/30/2017   GFRNONAA >60 12/30/2017                             Hepatic Function Markers Lab Results  Component Value Date   AST 13 11/15/2017   ALT 13 11/15/2017   ALBUMIN 4.2 11/15/2017   ALKPHOS 78 11/15/2017   LIPASE 33 10/24/2010                        Electrolytes Lab Results  Component Value Date   NA 138 12/30/2017   K 3.7 12/30/2017   CL 99 12/30/2017   CALCIUM 9.0 12/30/2017   MG 1.9 01/02/2018                        Neuropathy Markers Lab Results  Component Value Date   VITAMINB12 407 01/02/2018   HGBA1C 6.6 (H) 11/15/2017                        Bone Pathology Markers Lab Results  Component Value Date   VD25OH 11.36 (L) 11/16/2017                         Coagulation Parameters Lab  Results  Component Value Date   PLT 169 12/30/2017                        Cardiovascular Markers Lab Results  Component Value Date   TROPONINI < 0.02 06/25/2011   HGB 13.4 12/30/2017   HCT 40.5 12/30/2017                         Note: Lab results reviewed.  Recent Diagnostic Imaging Review  Knee Imaging: Knee-R DG 4 views:  Results for orders placed in visit on 11/15/17  DG Knee Complete 4 Views Right   Narrative CLINICAL DATA:  63 year old male with bilateral chronic knee pain, worse on the right than the left.  EXAM: RIGHT KNEE - COMPLETE 4+ VIEW  COMPARISON:  Concurrently obtained radiographs of the left knee; prior radiographs of the right knee 11/15/2006  FINDINGS: Mild degenerative changes present in the patellofemoral compartment with narrowing of the joint space and early patellar osteophyte formation. At least moderate degenerative changes are present in the medial compartment with more significant narrowing of the joint space, subchondral sclerosis  and osteophyte formation. Changes have progressed compared to 11/15/2006. Minimal changes present in the lateral compartment. No evidence of joint effusion, fracture or malalignment. The soft tissues are unremarkable.  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 There has been progression of the degenerative changes compared to the prior radiograph from 11/15/2006.   Electronically Signed   By: Jacqulynn Cadet M.D.   On: 11/16/2017 08:25    Knee-L DG 4 views:  Results for orders placed in visit on 11/15/17  DG Knee Complete 4 Views Left   Narrative CLINICAL DATA:  62 year old male with chronic bilateral knee pain, worse on the right than the left.  EXAM: LEFT KNEE - COMPLETE 4+ VIEW  COMPARISON:  Concurrently obtained radiographs of the right knee  FINDINGS: No evidence of acute fracture or malalignment. Mild degenerative changes in the patellofemoral and  medial compartments with narrowing of the joint space and subtle osteophyte formation. Bony mineralization appears to be normal. No lytic or blastic osseous lesions. No evidence of knee joint effusion. The soft tissues are unremarkable.  IMPRESSION: 1. Mild degenerative osteoarthritis in the patellofemoral and medial compartments. 2. No evidence of fracture, joint effusion or bony lesion.  Electronically Signed   By: Jacqulynn Cadet M.D.   On: 11/16/2017 08:23    Complexity Note: Imaging results reviewed. Results shared with Mr. Kutzer, using Layman's terms.                         Meds   Current Outpatient Medications:  .  albuterol (PROVENTIL HFA;VENTOLIN HFA) 108 (90 BASE) MCG/ACT inhaler, Inhale 2 puffs into the lungs every 4 (four) hours as needed. For shortness of breath, Disp: , Rfl:  .  amLODipine (NORVASC) 5 MG tablet, Take 5 mg by mouth daily., Disp: , Rfl:  .  aspirin 81 MG EC tablet, Take 81 mg by mouth daily. , Disp: , Rfl:  .  atorvastatin (LIPITOR) 20 MG tablet, Take 20 mg by mouth daily., Disp: , Rfl:  .  budesonide-formoterol (SYMBICORT) 80-4.5 MCG/ACT inhaler, Inhale 2 puffs into the lungs 2 (two) times daily., Disp: , Rfl:  .  cetirizine (ZYRTEC) 10 MG tablet, Take 10 mg by mouth daily., Disp: , Rfl:  .  Cholecalciferol 50000 units capsule, Take 1 capsule (50,000 Units total) by mouth once a week., Disp: 13 capsule, Rfl: 1 .  clopidogrel (PLAVIX) 75 MG tablet, Take 75 mg by mouth daily., Disp: , Rfl:  .  diclofenac (FLECTOR) 1.3 % PTCH, Place 1 patch onto the skin 2 (two) times daily., Disp: 60 patch, Rfl: 2 .  doxycycline (VIBRA-TABS) 100 MG tablet, Take 1 tablet (100 mg total) by mouth 2 (two) times daily. With food, Disp: 14 tablet, Rfl: 0 .  DULoxetine (CYMBALTA) 60 MG capsule, Take 1 capsule (60 mg total) by mouth daily., Disp: 90 capsule, Rfl: 3 .  furosemide (LASIX) 40 MG tablet, Take 40 mg by mouth daily., Disp: , Rfl:  .  hydrOXYzine (ATARAX/VISTARIL)  25 MG tablet, Take 1 tablet (25 mg total) by mouth 3 (three) times daily as needed., Disp: 30 tablet, Rfl: 0 .  isosorbide mononitrate (IMDUR) 30 MG 24 hr tablet, Take 1 tablet (30 mg total) by mouth daily., Disp: 90 tablet, Rfl: 3 .  ketorolac (ACULAR) 0.5 % ophthalmic solution, , Disp: , Rfl:  .  meloxicam (MOBIC) 7.5 MG tablet, Take 1 tablet (7.5 mg total) by mouth 2 (two) times daily as needed for pain.,  Disp: 60 tablet, Rfl: 2 .  metFORMIN (GLUCOPHAGE) 500 MG tablet, Take 1 tablet (500 mg total) by mouth daily with breakfast., Disp: 90 tablet, Rfl: 3 .  metoprolol tartrate (LOPRESSOR) 25 MG tablet, Take 25 mg by mouth 2 (two) times daily., Disp: , Rfl:  .  montelukast (SINGULAIR) 10 MG tablet, Take 10 mg by mouth at bedtime., Disp: , Rfl:  .  mupirocin ointment (BACTROBAN) 2 %, Apply to affected area 3 times daily, Disp: 15 g, Rfl: 0 .  nitroGLYCERIN (NITROSTAT) 0.4 MG SL tablet, Place 0.4 mg under the tongue every 5 (five) minutes as needed. For chest pain, Disp: , Rfl:  .  ondansetron (ZOFRAN) 4 MG tablet, Take 1 tablet (4 mg total) by mouth every 8 (eight) hours as needed for nausea or vomiting., Disp: 20 tablet, Rfl: 0 .  pantoprazole (PROTONIX) 40 MG tablet, Take 40 mg by mouth daily., Disp: , Rfl:  .  prednisoLONE acetate (PRED FORTE) 1 % ophthalmic suspension, , Disp: , Rfl:  .  ranitidine (ZANTAC) 150 MG/10ML syrup, 150 mg 2 (two) times daily. Taking 10 ml bid, Disp: , Rfl:  .  Secukinumab (COSENTYX 300 DOSE [place]), Inject into the skin every 30 (thirty) days., Disp: , Rfl:  .  spironolactone (ALDACTONE) 25 MG tablet, Take 25 mg by mouth daily., Disp: , Rfl:  .  triamcinolone cream (KENALOG) 0.1 %, Apply 1 application topically 2 (two) times daily. Not face, groin, underarms, Disp: 454 g, Rfl: 11  ROS  Constitutional: Denies any fever or chills Gastrointestinal: No reported hemesis, hematochezia, vomiting, or acute GI distress Musculoskeletal: Denies any acute onset joint swelling,  redness, loss of ROM, or weakness Neurological: No reported episodes of acute onset apraxia, aphasia, dysarthria, agnosia, amnesia, paralysis, loss of coordination, or loss of consciousness  Allergies  Mr. Mciver has No Known Allergies.  PFSH  Drug: Mr. Desantis  reports that he does not use drugs. Alcohol:  reports that he does not drink alcohol. Tobacco:  reports that he has quit smoking. He has never used smokeless tobacco. Medical:  has a past medical history of Arthritis, CAD (coronary artery disease), COPD (chronic obstructive pulmonary disease) ([place]), Depression, Diabetes mellitus, Heart disease, Hypertension, Migraines, and Psoriasis. Surgical: Mr. Eriksson  has a past surgical history that includes Cataract extraction (Bilateral); Cholecystectomy; Appendectomy; Tonsillectomy; STENT PLACEMENT VASCULAR ([place] [facility]); right knee surgery; Tonsillectomy and adenoidectomy; hemorrhoid surgery ; Eye surgery; Coronary angioplasty with stent; Hemorrhoid surgery; and Knee surgery. Family: family history includes Cancer in his mother; Heart disease in his father; Stroke in his father.  Constitutional Exam  General appearance: Well nourished, well developed, and well hydrated. In no apparent acute distress Vitals:   01/28/18 0914 01/28/18 0916  BP:  126/67  Pulse: 75   Resp: 18   Temp: 98.3 F (36.8 C)   SpO2: 95%   Weight: 240 lb (108.9 kg)   Height: _0  (1.702 m)    BMI Assessment: Estimated body mass index is 37.59 kg/m as calculated from the following:   Height as of this encounter: _1  (1.702 m).   Weight as of this encounter: 240 lb (108.9 kg).  BMI interpretation table: BMI level Category Range association with higher incidence of chronic pain  <18 kg/m2 Underweight   18.5-24.9 kg/m2 Ideal body weight   25-29.9 kg/m2 Overweight Increased incidence by 20%  30-34.9 kg/m2 Obese (Class I) Increased incidence by 68%  35-39.9 kg/m2 Severe obesity (Class II) Increased incidence by 136%   >  40 kg/m2 Extreme obesity (Class III) Increased incidence by 254%   Patient's current BMI Ideal Body weight  Body mass index is 37.59 kg/m. Ideal body weight: 66.1 kg (145 lb 11.6 oz) Adjusted ideal body weight: 83.2 kg (183 lb 6.9 oz)   BMI Readings from Last 4 Encounters:  01/28/18 37.59 kg/m  01/22/18 37.12 kg/m  01/02/18 37.59 kg/m  12/31/17 37.15 kg/m   Wt Readings from Last 4 Encounters:  01/28/18 240 lb (108.9 kg)  01/22/18 237 lb (107.5 kg)  01/02/18 240 lb (108.9 kg)  12/31/17 237 lb 3.2 oz (107.6 kg)  Psych/Mental status: Alert, oriented x 3 (person, place, & time)       Eyes: PERLA Respiratory: No evidence of acute respiratory distress  Cervical Spine Area Exam  Skin & Axial Inspection: No masses, redness, edema, swelling, or associated skin lesions Alignment: Symmetrical Functional ROM: Unrestricted ROM      Stability: No instability detected Muscle Tone/Strength: Functionally intact. No obvious neuro-muscular anomalies detected. Sensory (Neurological): Unimpaired Palpation: No palpable anomalies              Upper Extremity (UE) Exam    Side: Right upper extremity  Side: Left upper extremity  Skin & Extremity Inspection: Skin color, temperature, and hair growth are WNL. No peripheral edema or cyanosis. No masses, redness, swelling, asymmetry, or associated skin lesions. No contractures.  Skin & Extremity Inspection: Skin color, temperature, and hair growth are WNL. No peripheral edema or cyanosis. No masses, redness, swelling, asymmetry, or associated skin lesions. No contractures.  Functional ROM: Unrestricted ROM          Functional ROM: Unrestricted ROM          Muscle Tone/Strength: Functionally intact. No obvious neuro-muscular anomalies detected.  Muscle Tone/Strength: Functionally intact. No obvious neuro-muscular anomalies detected.  Sensory (Neurological): Unimpaired          Sensory (Neurological): Unimpaired          Palpation: No palpable anomalies               Palpation: No palpable anomalies              Provocative Test(s):  Phalen's test: deferred Tinel's test: deferred Apley's scratch test (touch opposite shoulder):  Action 1 (Across chest): deferred Action 2 (Overhead): deferred Action 3 (LB reach): deferred   Provocative Test(s):  Phalen's test: deferred Tinel's test: deferred Apley's scratch test (touch opposite shoulder):  Action 1 (Across chest): deferred Action 2 (Overhead): deferred Action 3 (LB reach): deferred    Thoracic Spine Area Exam  Skin & Axial Inspection: No masses, redness, or swelling Alignment: Symmetrical Functional ROM: Unrestricted ROM Stability: No instability detected Muscle Tone/Strength: Functionally intact. No obvious neuro-muscular anomalies detected. Sensory (Neurological): Unimpaired Muscle strength & Tone: No palpable anomalies  Lumbar Spine Area Exam  Skin & Axial Inspection: No masses, redness, or swelling Alignment: Symmetrical Functional ROM: Unrestricted ROM       Stability: No instability detected Muscle Tone/Strength: Functionally intact. No obvious neuro-muscular anomalies detected. Sensory (Neurological): Unimpaired Palpation: No palpable anomalies       Provocative Tests: Hyperextension/rotation test: deferred today       Lumbar quadrant test (Kemp's test): deferred today       Lateral bending test: deferred today       Patrick's Maneuver: deferred today                   FABER test: deferred today  S-I anterior distraction/compression test: deferred today         S-I lateral compression test: deferred today         S-I Thigh-thrust test: deferred today         S-I Gaenslen's test: deferred today          Gait & Posture Assessment  Ambulation: Unassisted Gait: Relatively normal for age and body habitus Posture: WNL   Lower Extremity Exam    Side: Right lower extremity  Side: Left lower extremity  Stability: No instability observed           Stability: No instability observed          Skin & Extremity Inspection: Skin color, temperature, and hair growth are WNL. No peripheral edema or cyanosis. No masses, redness, swelling, asymmetry, or associated skin lesions. No contractures.  Skin & Extremity Inspection: Skin color, temperature, and hair growth are WNL. No peripheral edema or cyanosis. No masses, redness, swelling, asymmetry, or associated skin lesions. No contractures.  Functional ROM: Decreased ROM for knee joint          Functional ROM: Unrestricted ROM                  Muscle Tone/Strength: Functionally intact. No obvious neuro-muscular anomalies detected.  Muscle Tone/Strength: Functionally intact. No obvious neuro-muscular anomalies detected.  Sensory (Neurological): Articular pain pattern  Sensory (Neurological): Articular pain pattern  Palpation: No palpable anomalies  Palpation: No palpable anomalies   Assessment & Plan  Primary Diagnosis & Pertinent Problem List: The primary encounter diagnosis was Chronic pain syndrome. Diagnoses of Chronic knee pain (Primary Area of Pain) (Bilateral) (R>L), Osteoarthritis of knee (Bilateral) (R>L), Tricompartment osteoarthritis of knee (Right), Chronic hand pain (Secondary Area of Pain) (Bilateral) (L>R), Osteoarthritis of hands (Bilateral), Chronic anticoagulation (PLAVIX), and Vitamin D deficiency were also pertinent to this visit.  Visit Diagnosis: 1. Chronic pain syndrome   2. Chronic knee pain (Primary Area of Pain) (Bilateral) (R>L)   3. Osteoarthritis of knee (Bilateral) (R>L)   4. Tricompartment osteoarthritis of knee (Right)   5. Chronic hand pain (Secondary Area of Pain) (Bilateral) (L>R)   6. Osteoarthritis of hands (Bilateral)   7. Chronic anticoagulation (PLAVIX)   8. Vitamin D deficiency    Problems updated and reviewed during this visit: Problem  Osteoarthritis of knee (Bilateral) (R>L)   Right knee: Tricompartmental osteoarthritis most significant in the medial  compartment. There has been progression of the degenerative changes compared to the prior radiograph from 11/15/2006. Left knee: Mild degenerative osteoarthritis in the patellofemoral and medial compartments.   Tricompartment osteoarthritis of knee (Right)  Osteoarthritis of hands (Bilateral)   Right hand: Spurring of the thumb metacarpal head. Minimal spurring at the MCP joints. Left hand: Mild changes of degenerative osteoarthritis in the thumb interphalangeal joint and the index finger distal interphalangeal joint. There is narrowing of the joint space and subtle osteophyte formation.   Chronic knee pain (Primary Area of Pain) (Bilateral) (R>L)  Chronic hand pain (Secondary Area of Pain) (Bilateral) (L>R)  Chronic Pain Syndrome  Chest Pain With High Risk for Cardiac Etiology   Known coronary disease with intermittent anginal symptoms that appear at his baseline EKG is normal today however given his increased dyspnea dyspnea I have sent him down to the ED for more urgent evaluation and treatment to include rule out of active ischemic disease   Otalgia   07/05/12- possible fungal - will have him use the acetic acid gtts and water precautions-  rtc in 2 weeks for re-examination  4/28: no idea why ear hurts, refferral to ENT Last Assessment & Plan:  Cash powder applied and is ears bilaterally.  The patient was started on Cortisporin 3 times a day for 2 weeks.   He has difficulty paying for medications.  He will follow up as needed.  07/05/12- possible fungal - will have him use the acetic acid gtts and water  precautions- rtc in 2 weeks for re-examination  4/28: no idea why ear hurts,  refferral to ENT  Last Assessment & Plan:  Cash powder applied and is ears bilaterally.  The patient was started on Cortisporin 3 times a day for 2 weeks.   He has difficulty paying for medications.  He will follow up as needed. 07/05/12- possible fungal - will have him use the acetic acid gtts and water   precautions- rtc in 2 weeks for re-examination  4/28: no idea why ear hurts,  refferral to ENT  Last Assessment & Plan:  Cash powder applied and is ears bilaterally.  The patient was started on Cortisporin 3 times a day for 2 weeks.   He has difficulty paying for medications.  He will follow up as needed. 07/05/12- possible fungal - will have him use the acetic acid gtts and water  precautions- rtc in 2 weeks for re-examination  4/28: no idea why ear hurts,  refferral to ENT  Last Assessment & Plan:  Cash powder applied and is ears bilaterally.  The patient was started on Cortisporin 3 times a day for 2 weeks.   He has difficulty paying for medications.  He will follow up as needed. Overview:  Overview:  07/05/12- possible fungal - will have him use the acetic acid gtts and water  precautions- rtc in 2 weeks for re-examination  4/28: no idea why ear hurts,  refferral to ENT  Last Assessment & Plan:  Cash powder applied and is ears bilaterally.  The patient was started on Cortisporin 3 times a day for 2 weeks.   He has difficulty paying for medications.  He will follow up as needed.   Arthritis  Headaches Due to Old Head Injury  Long Term Current Use of Opiate Analgesic  Pharmacologic Therapy  Disorder of Skeletal System  Problems Influencing Health Status  Cognitive Impairment  Vitamin D Deficiency  Dyspnea   Increased work of breathing with saturations ranging from 93-90% differential includes pneumonia viral process or given that his abdomen is increasing in girth there might be some fluid compression upon the lungs I have sent him down to the ED for more urgent evaluation given his history of coronary disease  Last Assessment & Plan:  Increased work of breathing with saturations ranging from 93-90% differential includes pneumonia viral process or given that his abdomen is increasing in girth there might be some fluid compression upon the lungs I have sent him down to the ED for  more urgent evaluation given his history of coronary disease Last Assessment & Plan:  Increased work of breathing with saturations ranging from 93-90% differential includes pneumonia viral process or given that his abdomen is increasing in girth there might be some fluid compression upon the lungs I have sent him down to the ED for more urgent evaluation given his history of coronary disease Last Assessment & Plan:  Increased work of breathing with saturations ranging from 93-90% differential includes pneumonia viral process or given that his abdomen is increasing in girth there might be some fluid  compression upon the lungs I have sent him down to the ED for more urgent evaluation given his history of coronary disease Overview:  Last Assessment & Plan:  Increased work of breathing with saturations ranging from 93-90% differential includes pneumonia viral process or given that his abdomen is increasing in girth there might be some fluid compression upon the lungs I have sent him down to the ED for more urgent evaluation given his history of coronary disease   Dm2 (Diabetes Mellitus, Type 2) (Hcc)  Psoriasis  Skin Sore  Diabetes Mellitus Without Complication (Hcc)  Essential Hypertension  Coronary Artery 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 Buena Irish   Edema  Mixed Hyperlipidemia  Htn (Hypertension)  Infective Otitis Externa   The patient's ears were cleaned bilaterally in Cash powder applied.  He will complete his course of Cipro oral and Floxin drops.  The patient will follow up in 1 month.  Last Assessment & Plan:  The patient's ears were cleaned bilaterally in Cash powder applied.  He will complete his course of Cipro oral and Floxin drops.  The patient will follow up in 1 month. Last Assessment & Plan:   The patient's ears were cleaned bilaterally in Cash powder applied.  He will complete his course of Cipro oral and Floxin drops.  The patient will follow up in 1 month. Last Assessment & Plan:  The patient's ears were cleaned bilaterally in Cash powder applied.  He will complete his course of Cipro oral and Floxin drops.  The patient will follow up in 1 month. Overview:  Last Assessment & Plan:  The patient's ears were cleaned bilaterally in Cash powder applied.  He will complete his course of Cipro oral and Floxin drops.  The patient will follow up in 1 month.   Coronary Atherosclerosis   S/p 3 stents  08/02/12-He has had 2 episodes of angina since he last saw me - use Nitro w/ cessation of sx's. He LDL is 110 - increased his Atorvastatin to 20 mg, He has scheduled to see Dr. Buena Irish but was not able to be reached by that office- will facilitate that apt  4/28: no ischemia on EKG, no current sxs, card referral  IMO update 2017  S/p 3 stents  Overview:  Overview:  08/02/12-He has had 2 episodes of angina since he last saw me - use Nitro w/  cessation of sx's. He LDL is 110 - increased his Atorvastatin to 20 mg, He has  scheduled to see Dr. Buena Irish but was not able to be reached by that office-  will facilitate that apt  4/28: no ischemia on EKG, no current sxs, card  referral  IMO update 2017 Overview:  08/02/12-He has had 2 episodes of angina since he last saw me - use Nitro w/  cessation of sx's. He LDL is 110 - increased his Atorvastatin to 20 mg, He has  scheduled to see Dr. Buena Irish but was not able to be reached by that office-  will facilitate that apt  4/28: no ischemia on EKG, no current sxs, card  referral  IMO update 2017 08/02/12-He has had 2 episodes of angina since he last saw me - use Nitro w/  cessation of sx's. He LDL is 110 - increased his Atorvastatin to 20 mg, He has  scheduled to see Dr.  Buena Irish but was not able to be reached by that office-  will facilitate  that apt  4/28: no ischemia on EKG, no current sxs, card  referral  IMO update 2017 08/02/12-He has had 2 episodes of angina since he last saw me - use Nitro w/  cessation of sx's. He LDL is 110 - increased his Atorvastatin to 20 mg, He has  scheduled to see Dr. Buena Irish but was not able to be reached by that office-  will facilitate that apt  4/28: no ischemia on EKG, no current sxs, card  referral  IMO update 2017   Benign Essential Hypertension   07/05/12- take smeds daily - rtc for re--evaluation in 2-3 weeks, request labs/ notes  from Dr. Buena Irish  07/05/12- take smeds daily - rtc for re--evaluation in 2-3 weeks, request labs/  notes  from Dr. Buena Irish   Chronic Airway Obstruction, Not Elsewhere Classified   07/05/12- est today -- off symbicort- no smoking hx- will refill this  and check spriometry in near future - request old records if possible  08/02/12--- unknown hx of dx, still smokes- on symbicort- PFT today shows very severe obstruction and severe restriction - I think this restriction is from his girth -- will increase his symbicort to 160 - 4.5 and advise him to lose weight  07/05/12- est today -- off symbicort- no smoking hx- will refill this  and  check spriometry in near future - request old records if possible  08/02/12---  unknown hx of dx, still smokes- on symbicort- PFT today shows very severe  obstruction and severe restriction - I think this restriction is from his  girth -- will increase his symbicort to 160 - 4.5 and advise him to lose  weight 07/05/12- est today -- off symbicort- no smoking hx- will refill this  and  check spriometry in near future - request old records if possible  08/02/12---  unknown hx of dx, still smokes- on symbicort- PFT today shows very severe  obstruction and severe restriction - I think this restriction is from his  girth -- will increase his symbicort to 160 - 4.5 and advise him to lose  weight 07/05/12- est today -- off symbicort- no  smoking hx- will refill this  and  check spriometry in near future - request old records if possible  08/02/12---  unknown hx of dx, still smokes- on symbicort- PFT today shows very severe  obstruction and severe restriction - I think this restriction is from his  girth -- will increase his symbicort to 160 - 4.5 and advise him to lose  weight Overview:  Overview:  07/05/12- est today -- off symbicort- no smoking hx- will refill this  and  check spriometry in near future - request old records if possible  08/02/12---  unknown hx of dx, still smokes- on symbicort- PFT today shows very severe  obstruction and severe restriction - I think this restriction is from his  girth -- will increase his symbicort to 160 - 4.5 and advise him to lose  weight   Depressive Disorder, Not Elsewhere Classified  Family History of Ischemic Heart Disease (Ihd)  Family History of Ischemic Heart Disease and Other Diseases of The Circulatory System  Allergic Rhinitis  Other Psoriasis   07/05/12- request notes from his dermatologist  07/05/12- request notes from his dermatologist 07/05/12- request notes from his dermatologist 07/05/12- request notes from his dermatologist Overview:  Overview:  07/05/12- request notes from his dermatologist   Benign Neoplasm  of Colon  Asthma  Diarrhea  Type 2 Diabetes Mellitus Without Complications (Hcc)   6/33/35 - deit controlled - A1c 5.8- hold on adding metformin for now- will have him focus on weight loss and continued dietary choices- continue to check 000 rtc in 3 mo  08/02/12 - deit controlled - A1c 5.8- hold on adding metformin for now- will  have him focus on weight loss and continued dietary choices- continue to check  000 rtc in 3 mo 08/02/12 - deit controlled - A1c 5.8- hold on adding metformin for now- will  have him focus on weight loss and continued dietary choices- continue to check  000 rtc in 3 mo 08/02/12 - deit controlled - A1c 5.8- hold on adding metformin  for now- will  have him focus on weight loss and continued dietary choices- continue to check  000 rtc in 3 mo Overview:  Overview:  08/02/12 - deit controlled - A1c 5.8- hold on adding metformin for now- will  have him focus on weight loss and continued dietary choices- continue to check  000 rtc in 3 mo   Abdominal Pain   Last Assessment & Plan:  Known coronary disease with intermittent anginal symptoms that appear at his baseline EKG is normal today however given his increased dyspnea dyspnea I have sent him down to the ED for more urgent evaluation and treatment to include rule out of active ischemic disease Last Assessment & Plan:  Known coronary disease with intermittent anginal symptoms that appear at his baseline EKG is normal today however given his increased dyspnea dyspnea I have sent him down to the ED for more urgent evaluation and treatment to include rule out of active ischemic disease Last Assessment & Plan:  Known coronary disease with intermittent anginal symptoms that appear at his baseline EKG is normal today however given his increased dyspnea dyspnea I have sent him down to the ED for more urgent evaluation and treatment to include rule out of active ischemic disease Overview:  Last Assessment & Plan:  Known coronary disease with intermittent anginal symptoms that appear at his baseline EKG is normal today however given his increased dyspnea dyspnea I have sent him down to the ED for more urgent evaluation and treatment to include rule out of active ischemic disease   Chronic anticoagulation (PLAVIX)   (Stop: 7-10 days  Restart: 2 hrs)    Time Note: Greater than 50% of the 40 minute(s) of face-to-face time spent with Mr. Harrel, was spent in counseling/coordination of care regarding: the appropriate use of the pain scale, Mr. Starnes primary cause of pain, the results of his recent test(s), the significance of each one oth the test(s) anomalies and it's corresponding  characteristic pain pattern(s), the treatment plan, treatment alternatives, the risks and possible complications of proposed treatment, realistic expectations, the goals of pain management (increased in functionality) and the need to collect and read the AVS material. Plan of Care  Pharmacotherapy (Medications Ordered): No orders of the defined types were placed in this encounter.   Procedure Orders     KNEE INJECTION Lab Orders  No laboratory test(s) ordered today   Imaging Orders  No imaging studies ordered today   Referral Orders  No referral(s) requested today    Pharmacological management options:  Opioid Analgesics: I will not be prescribing any opioids at this time Membrane stabilizer: We have discussed the possibility of optimizing this mode of therapy, if tolerated Muscle relaxant: We have discussed the possibility of a  trial NSAID: We have discussed the possibility of a trial Other analgesic(s): To be determined at a later time   Interventional management options: Planned, scheduled, and/or pending:    NOTE: Stop PLAVIX 7-10 days before procedures. Therapeutic bilateral, series of 5, intra-articular Hyalgan knee injections #1    Considering:   Diagnostic bilateral intra-articular knee injections with local anesthetic and steroid  Therapeutic bilateral, series of 5, intra-articular Hyalgan knee injections  Diagnostic bilateral genicular nerve block  Possible bilateral genicular nerve RFA  Diagnostic bilateral intra-articular hand injection    PRN Procedures:   None at this time   Provider-requested follow-up: Return for Procedure (no sedation): (B) Hyalgan Knee inj #1, (Blood-thinner Protocol).  Future Appointments  Date Time Provider [place]  02/05/2018 12:30 PM Milinda Pointer, MD [facility] None  02/27/2018  2:00 PM McLean-Scocuzza, Nino Glow, MD [facility]  03/11/2018  9:00 AM Rockey Situ, Kathlene November, MD [id number]    Primary Care  Physician: McLean-Scocuzza, Nino Glow, MD Location: [facility] Note by: Gaspar Cola, MD Date: 01/28/2018; Time: 4:19 PM

## 2018-01-28 ENCOUNTER — Other Ambulatory Visit: Payer: Self-pay

## 2018-01-28 ENCOUNTER — Encounter: Payer: Self-pay | Admitting: Pain Medicine

## 2018-01-28 ENCOUNTER — Ambulatory Visit: Payer: Medicare HMO | Attending: Pain Medicine | Admitting: Pain Medicine

## 2018-01-28 VITALS — BP 126/67 | HR 75 | Temp 98.3°F | Resp 18 | Ht 67.0 in | Wt 240.0 lb

## 2018-01-28 DIAGNOSIS — E782 Mixed hyperlipidemia: Secondary | ICD-10-CM | POA: Insufficient documentation

## 2018-01-28 DIAGNOSIS — Z955 Presence of coronary angioplasty implant and graft: Secondary | ICD-10-CM | POA: Insufficient documentation

## 2018-01-28 DIAGNOSIS — M25562 Pain in left knee: Secondary | ICD-10-CM | POA: Diagnosis not present

## 2018-01-28 DIAGNOSIS — M79641 Pain in right hand: Secondary | ICD-10-CM | POA: Diagnosis not present

## 2018-01-28 DIAGNOSIS — G43909 Migraine, unspecified, not intractable, without status migrainosus: Secondary | ICD-10-CM | POA: Insufficient documentation

## 2018-01-28 DIAGNOSIS — Z87891 Personal history of nicotine dependence: Secondary | ICD-10-CM | POA: Diagnosis not present

## 2018-01-28 DIAGNOSIS — Z7984 Long term (current) use of oral hypoglycemic drugs: Secondary | ICD-10-CM | POA: Insufficient documentation

## 2018-01-28 DIAGNOSIS — Z79891 Long term (current) use of opiate analgesic: Secondary | ICD-10-CM | POA: Insufficient documentation

## 2018-01-28 DIAGNOSIS — J45909 Unspecified asthma, uncomplicated: Secondary | ICD-10-CM | POA: Diagnosis not present

## 2018-01-28 DIAGNOSIS — M25561 Pain in right knee: Secondary | ICD-10-CM | POA: Diagnosis not present

## 2018-01-28 DIAGNOSIS — E119 Type 2 diabetes mellitus without complications: Secondary | ICD-10-CM | POA: Insufficient documentation

## 2018-01-28 DIAGNOSIS — G8929 Other chronic pain: Secondary | ICD-10-CM

## 2018-01-28 DIAGNOSIS — M1990 Unspecified osteoarthritis, unspecified site: Secondary | ICD-10-CM | POA: Insufficient documentation

## 2018-01-28 DIAGNOSIS — M79642 Pain in left hand: Secondary | ICD-10-CM

## 2018-01-28 DIAGNOSIS — M170 Bilateral primary osteoarthritis of knee: Secondary | ICD-10-CM | POA: Diagnosis not present

## 2018-01-28 DIAGNOSIS — L409 Psoriasis, unspecified: Secondary | ICD-10-CM | POA: Insufficient documentation

## 2018-01-28 DIAGNOSIS — E559 Vitamin D deficiency, unspecified: Secondary | ICD-10-CM | POA: Insufficient documentation

## 2018-01-28 DIAGNOSIS — M19042 Primary osteoarthritis, left hand: Secondary | ICD-10-CM | POA: Diagnosis not present

## 2018-01-28 DIAGNOSIS — Z7982 Long term (current) use of aspirin: Secondary | ICD-10-CM | POA: Insufficient documentation

## 2018-01-28 DIAGNOSIS — M19041 Primary osteoarthritis, right hand: Secondary | ICD-10-CM | POA: Insufficient documentation

## 2018-01-28 DIAGNOSIS — G894 Chronic pain syndrome: Secondary | ICD-10-CM | POA: Diagnosis not present

## 2018-01-28 DIAGNOSIS — J449 Chronic obstructive pulmonary disease, unspecified: Secondary | ICD-10-CM | POA: Diagnosis not present

## 2018-01-28 DIAGNOSIS — Z79899 Other long term (current) drug therapy: Secondary | ICD-10-CM | POA: Insufficient documentation

## 2018-01-28 DIAGNOSIS — I2510 Atherosclerotic heart disease of native coronary artery without angina pectoris: Secondary | ICD-10-CM | POA: Diagnosis not present

## 2018-01-28 DIAGNOSIS — F329 Major depressive disorder, single episode, unspecified: Secondary | ICD-10-CM | POA: Diagnosis not present

## 2018-01-28 DIAGNOSIS — I10 Essential (primary) hypertension: Secondary | ICD-10-CM | POA: Insufficient documentation

## 2018-01-28 DIAGNOSIS — M1711 Unilateral primary osteoarthritis, right knee: Secondary | ICD-10-CM | POA: Diagnosis not present

## 2018-01-28 DIAGNOSIS — Z7901 Long term (current) use of anticoagulants: Secondary | ICD-10-CM | POA: Diagnosis not present

## 2018-01-28 NOTE — Patient Instructions (Addendum)
____________________________________________________________________________________________  Preparing for your procedure (without sedation)  Instructions: . Oral Intake: Do not eat or drink anything for at least 3 hours prior to your procedure. . Transportation: Unless otherwise stated by your physician, you may drive yourself after the procedure. . Blood Pressure Medicine: Take your blood pressure medicine with a sip of water the morning of the procedure. . Blood thinners: Notify our staff if you are taking any blood thinners. Depending on which one you take, there will be specific instructions on how and when to stop it. . Diabetics on insulin: Notify the staff so that you can be scheduled 1st case in the morning. If your diabetes requires high dose insulin, take only  of your normal insulin dose the morning of the procedure and notify the staff that you have done so. . Preventing infections: Shower with an antibacterial soap the morning of your procedure.  . Build-up your immune system: Take 1000 mg of Vitamin C with every meal (3 times a day) the day prior to your procedure. Marland Kitchen Antibiotics: Inform the staff if you have a condition or reason that requires you to take antibiotics before dental procedures. . Pregnancy: If you are pregnant, call and cancel the procedure. . Sickness: If you have a cold, fever, or any active infections, call and cancel the procedure. . Arrival: You must be in the facility at least 30 minutes prior to your scheduled procedure. . Children: Do not bring any children with you. . Dress appropriately: Bring dark clothing that you would not mind if they get stained. . Valuables: Do not bring any jewelry or valuables.  Procedure appointments are reserved for interventional treatments only. Marland Kitchen No Prescription Refills. . No medication changes will be discussed during procedure appointments. . No disability issues will be discussed.  Reasons to call and reschedule or  cancel your procedure: (Following these recommendations will minimize the risk of a serious complication.) . Surgeries: Avoid having procedures within 2 weeks of any surgery. (Avoid for 2 weeks before or after any surgery). . Flu Shots: Avoid having procedures within 2 weeks of a flu shots or . (Avoid for 2 weeks before or after immunizations). . Barium: Avoid having a procedure within 7-10 days after having had a radiological study involving the use of radiological contrast. (Myelograms, Barium swallow or enema study). . Heart attacks: Avoid any elective procedures or surgeries for the initial 6 months after a "Myocardial Infarction" (Heart Attack). . Blood thinners: It is imperative that you stop these medications before procedures. Let us know if you if you take any blood thinner.  . Infection: Avoid procedures during or within two weeks of an infection (including chest colds or gastrointestinal problems). Symptoms associated with infections include: Localized redness, fever, chills, night sweats or profuse sweating, burning sensation when voiding, cough, congestion, stuffiness, runny nose, sore throat, diarrhea, nausea, vomiting, cold or Flu symptoms, recent or current infections. It is specially important if the infection is over the area that we intend to treat. Marland Kitchen Heart and lung problems: Symptoms that may suggest an active cardiopulmonary problem include: cough, chest pain, breathing difficulties or shortness of breath, dizziness, ankle swelling, uncontrolled high or unusually low blood pressure, and/or palpitations. If you are experiencing any of these symptoms, cancel your procedure and contact your primary care physician for an evaluation.  Remember:  Regular Business hours are:  Monday to Thursday 8:00 AM to 4:00 PM  Provider's Schedule: Milinda Pointer, MD:  Procedure days: Tuesday and Thursday 7:30 AM to  4:00 PM  Gillis Santa, MD:  Procedure days: Monday and Wednesday 7:30 AM to 4:00  PM ____________________________________________________________________________________________   ____________________________________________________________________________________________  Pain Scale  Introduction: The pain score used by this practice is the Verbal Numerical Rating Scale (VNRS-11). This is an 11-point scale. It is for adults and children 10 years or older. There are significant differences in how the pain score is reported, used, and applied. Forget everything you learned in the past and learn this scoring system.  General Information: The scale should reflect your current level of pain. Unless you are specifically asked for the level of your worst pain, or your average pain. If you are asked for one of these two, then it should be understood that it is over the past 24 hours.  Basic Activities of Daily Living (ADL): Personal hygiene, dressing, eating, transferring, and using restroom.  Instructions: Most patients tend to report their level of pain as a combination of two factors, their physical pain and their psychosocial pain. This last one is also known as "suffering" and it is reflection of how physical pain affects you socially and psychologically. From now on, report them separately. From this point on, when asked to report your pain level, report only your physical pain. Use the following table for reference.  Pain Clinic Pain Levels (0-5/10)  Pain Level Score  Description  No Pain 0   Mild pain 1 Nagging, annoying, but does not interfere with basic activities of daily living (ADL). Patients are able to eat, bathe, get dressed, toileting (being able to get on and off the toilet and perform personal hygiene functions), transfer (move in and out of bed or a chair without assistance), and maintain continence (able to control bladder and bowel functions). Blood pressure and heart rate are unaffected. A normal heart rate for a healthy adult ranges from 60 to 100 bpm (beats  per minute).   Mild to moderate pain 2 Noticeable and distracting. Impossible to hide from other people. More frequent flare-ups. Still possible to adapt and function close to normal. It can be very annoying and may have occasional stronger flare-ups. With discipline, patients may get used to it and adapt.   Moderate pain 3 Interferes significantly with activities of daily living (ADL). It becomes difficult to feed, bathe, get dressed, get on and off the toilet or to perform personal hygiene functions. Difficult to get in and out of bed or a chair without assistance. Very distracting. With effort, it can be ignored when deeply involved in activities.   Moderately severe pain 4 Impossible to ignore for more than a few minutes. With effort, patients may still be able to manage work or participate in some social activities. Very difficult to concentrate. Signs of autonomic nervous system discharge are evident: dilated pupils (mydriasis); mild sweating (diaphoresis); sleep interference. Heart rate becomes elevated (>115 bpm). Diastolic blood pressure (lower number) rises above 100 mmHg. Patients find relief in laying down and not moving.   Severe pain 5 Intense and extremely unpleasant. Associated with frowning face and frequent crying. Pain overwhelms the senses.  Ability to do any activity or maintain social relationships becomes significantly limited. Conversation becomes difficult. Pacing back and forth is common, as getting into a comfortable position is nearly impossible. Pain wakes you up from deep sleep. Physical signs will be obvious: pupillary dilation; increased sweating; goosebumps; brisk reflexes; cold, clammy hands and feet; nausea, vomiting or dry heaves; loss of appetite; significant sleep disturbance with inability to fall asleep or to  remain asleep. When persistent, significant weight loss is observed due to the complete loss of appetite and sleep deprivation.  Blood pressure and heart rate  becomes significantly elevated. Caution: If elevated blood pressure triggers a pounding headache associated with blurred vision, then the patient should immediately seek attention at an urgent or emergency care unit, as these may be signs of an impending stroke.    Emergency Department Pain Levels (6-10/10)  Emergency Room Pain 6 Severely limiting. Requires emergency care and should not be seen or managed at an outpatient pain management facility. Communication becomes difficult and requires great effort. Assistance to reach the emergency department may be required. Facial flushing and profuse sweating along with potentially dangerous increases in heart rate and blood pressure will be evident.   Distressing pain 7 Self-care is very difficult. Assistance is required to transport, or use restroom. Assistance to reach the emergency department will be required. Tasks requiring coordination, such as bathing and getting dressed become very difficult.   Disabling pain 8 Self-care is no longer possible. At this level, pain is disabling. The individual is unable to do even the most "basic" activities such as walking, eating, bathing, dressing, transferring to a bed, or toileting. Fine motor skills are lost. It is difficult to think clearly.   Incapacitating pain 9 Pain becomes incapacitating. Thought processing is no longer possible. Difficult to remember your own name. Control of movement and coordination are lost.   The worst pain imaginable 10 At this level, most patients pass out from pain. When this level is reached, collapse of the autonomic nervous system occurs, leading to a sudden drop in blood pressure and heart rate. This in turn results in a temporary and dramatic drop in blood flow to the brain, leading to a loss of consciousness. Fainting is one of the body's self defense mechanisms. Passing out puts the brain in a calmed state and causes it to shut down for a while, in order to begin the healing  process.    Summary: 1. Refer to this scale when providing [place] with your pain level. 2. Be accurate and careful when reporting your pain level. This will help with your care. 3. Over-reporting your pain level will lead to loss of credibility. 4. Even a level of 1/10 means that there is pain and will be treated at our facility. 5. High, inaccurate reporting will be documented as "Symptom Exaggeration", leading to loss of credibility and suspicions of possible secondary gains such as obtaining more narcotics, or wanting to appear disabled, for fraudulent reasons. 6. Only pain levels of 5 or below will be seen at our facility. 7. Pain levels of 6 and above will be sent to the Emergency Department and the appointment cancelled. ____________________________________________________________________________________________  ____________________________________________________________________________________________  Blood Thinners  Recommended Time Interval Before and After Neuraxial Block or Catheter Removal  Drug (Generic) Brand Name Time Before Time After Comments  Abciximab Reopro 15 days 2 hours   Alteplase Activase 10 days 10 days   Apixaban Eliquis 3 days 6 hours   Aspirin > 325 mg Goody Powders/Excedrin 11 days  (Usually not stopped)  Aspirin ? 81 mg  7 days  (Usually not stopped)  Cholesterol Medication Lipitor 4 days    Cilostazol Pletal 3 days 5 hours   Clopidogrel Plavix 7-10 days 2 hours   Dabigatran Pradaxa 5 days 6 hours   Delteparin Fragmin 24 hours 4 hours   Dipyridamole + ASA Aggrenox 11days 2 hours   Enoxaparin  Lovenox 24  hours 4 hours   Eptifibatide Integrillin 8 hours 2 hours   Fish oil  4 days    Fondaparinux  Arixtra 72 hours 12 hours   Garlic supplements  7 days    Ginkgo biloba  36 hours    Ginseng  24 hours    Heparin (IV)  4 hours 2 hours   Heparin ([place])  12 hours 2 hours   Hydroxychloroquine Plaquenil 11 days    LMW Heparin  24 hours    LMWH  24 hours     NSAIDs  3 days  (Usually not stopped)  Prasugrel Effient 7-10 days 6 hours   Reteplase Retavase 10 days 10 days   Rivaroxaban Xarelto 3 days 6 hours   Streptokinase Streptase 10 days 10 days   Tenecteplase TNKase 10 days 10 days   Thrombolytics  10 days  10 days Avoid x 10 days after inj.  Ticagrelor Brilinta 5-7 days 6 hours   Ticlodipine Ticlid 10-14 days 2 hours   Tinzaparin Innohep 24 hours 4 hours   Tirofiban Aggrastat 8 hours 2 hours   Vitamin E  4 days    Warfarin Coumadin 5 days 2 hours   ____________________________________________________________________________________________

## 2018-01-28 NOTE — Progress Notes (Signed)
Safety precautions to be maintained throughout the outpatient stay will include: orient to surroundings, keep bed in low position, maintain call bell within reach at all times, provide assistance with transfer out of bed and ambulation.  

## 2018-01-30 ENCOUNTER — Encounter
Admission: RE | Disposition: A | Discharge status: Home / Self Care | Payer: Self-pay | Source: Ambulatory Visit | Attending: Cardiology

## 2018-01-30 ENCOUNTER — Ambulatory Visit
Admission: RE | Admit: 2018-01-30 | Discharge: 2018-01-30 | Disposition: A | Discharge status: Home / Self Care | Payer: Medicare HMO | Source: Ambulatory Visit | Attending: Cardiology | Admitting: Cardiology

## 2018-01-30 ENCOUNTER — Encounter: Payer: Self-pay | Admitting: Cardiology

## 2018-01-30 DIAGNOSIS — R9431 Abnormal electrocardiogram [ECG] [EKG]: Secondary | ICD-10-CM | POA: Insufficient documentation

## 2018-01-30 DIAGNOSIS — Z955 Presence of coronary angioplasty implant and graft: Secondary | ICD-10-CM | POA: Diagnosis not present

## 2018-01-30 DIAGNOSIS — Z7902 Long term (current) use of antithrombotics/antiplatelets: Secondary | ICD-10-CM | POA: Diagnosis not present

## 2018-01-30 DIAGNOSIS — R079 Chest pain, unspecified: Secondary | ICD-10-CM | POA: Diagnosis present

## 2018-01-30 DIAGNOSIS — Z87891 Personal history of nicotine dependence: Secondary | ICD-10-CM | POA: Diagnosis not present

## 2018-01-30 DIAGNOSIS — I200 Unstable angina: Secondary | ICD-10-CM | POA: Diagnosis not present

## 2018-01-30 DIAGNOSIS — Z79899 Other long term (current) drug therapy: Secondary | ICD-10-CM | POA: Diagnosis not present

## 2018-01-30 DIAGNOSIS — Z7982 Long term (current) use of aspirin: Secondary | ICD-10-CM | POA: Insufficient documentation

## 2018-01-30 DIAGNOSIS — M810 Age-related osteoporosis without current pathological fracture: Secondary | ICD-10-CM | POA: Insufficient documentation

## 2018-01-30 DIAGNOSIS — E1165 Type 2 diabetes mellitus with hyperglycemia: Secondary | ICD-10-CM | POA: Diagnosis not present

## 2018-01-30 DIAGNOSIS — E7800 Pure hypercholesterolemia, unspecified: Secondary | ICD-10-CM | POA: Diagnosis not present

## 2018-01-30 DIAGNOSIS — J449 Chronic obstructive pulmonary disease, unspecified: Secondary | ICD-10-CM | POA: Diagnosis not present

## 2018-01-30 DIAGNOSIS — Z9889 Other specified postprocedural states: Secondary | ICD-10-CM | POA: Diagnosis not present

## 2018-01-30 DIAGNOSIS — I2510 Atherosclerotic heart disease of native coronary artery without angina pectoris: Secondary | ICD-10-CM | POA: Diagnosis not present

## 2018-01-30 DIAGNOSIS — Z9849 Cataract extraction status, unspecified eye: Secondary | ICD-10-CM | POA: Diagnosis not present

## 2018-01-30 DIAGNOSIS — Z7951 Long term (current) use of inhaled steroids: Secondary | ICD-10-CM | POA: Insufficient documentation

## 2018-01-30 DIAGNOSIS — K219 Gastro-esophageal reflux disease without esophagitis: Secondary | ICD-10-CM | POA: Insufficient documentation

## 2018-01-30 DIAGNOSIS — I10 Essential (primary) hypertension: Secondary | ICD-10-CM | POA: Insufficient documentation

## 2018-01-30 DIAGNOSIS — I25110 Atherosclerotic heart disease of native coronary artery with unstable angina pectoris: Secondary | ICD-10-CM | POA: Diagnosis not present

## 2018-01-30 HISTORY — PX: LEFT HEART CATH AND CORONARY ANGIOGRAPHY: CATH118249

## 2018-01-30 LAB — GLUCOSE, CAPILLARY: Glucose-Capillary: 102 mg/dL — ABNORMAL HIGH (ref 70–99)

## 2018-01-30 SURGERY — LEFT HEART CATH AND CORONARY ANGIOGRAPHY
Anesthesia: Moderate Sedation | Laterality: Left

## 2018-01-30 MED ORDER — SODIUM CHLORIDE 0.9 % IV SOLN: 250.0000 mL | INTRAVENOUS | Status: DC | PRN

## 2018-01-30 MED ORDER — SODIUM CHLORIDE 0.9% FLUSH: 3.0000 mL | INTRAVENOUS | Status: DC | PRN

## 2018-01-30 MED ORDER — HEPARIN (PORCINE) IN NACL 1000-0.9 UT/500ML-% IV SOLN
INTRAVENOUS | Status: AC
  Filled 2018-01-30: qty 1000

## 2018-01-30 MED ORDER — MIDAZOLAM HCL 2 MG/2ML IJ SOLN
INTRAMUSCULAR | Status: DC | PRN
  Administered 2018-01-30: 1 mg via INTRAVENOUS

## 2018-01-30 MED ORDER — VERAPAMIL HCL 2.5 MG/ML IV SOLN
INTRAVENOUS | Status: AC
  Filled 2018-01-30: qty 2

## 2018-01-30 MED ORDER — SODIUM CHLORIDE 0.9 % WEIGHT BASED INFUSION: 1.0000 mL/kg/h | INTRAVENOUS | Status: DC

## 2018-01-30 MED ORDER — ASPIRIN 81 MG PO CHEW
81.0000 mg | CHEWABLE_TABLET | ORAL | Status: AC
  Administered 2018-01-30: 81 mg via ORAL

## 2018-01-30 MED ORDER — MIDAZOLAM HCL 2 MG/2ML IJ SOLN
INTRAMUSCULAR | Status: AC
  Filled 2018-01-30: qty 2

## 2018-01-30 MED ORDER — FENTANYL CITRATE (PF) 100 MCG/2ML IJ SOLN
INTRAMUSCULAR | Status: DC | PRN
  Administered 2018-01-30: 50 ug via INTRAVENOUS

## 2018-01-30 MED ORDER — HEPARIN SODIUM (PORCINE) 1000 UNIT/ML IJ SOLN
INTRAMUSCULAR | Status: AC
  Filled 2018-01-30: qty 1

## 2018-01-30 MED ORDER — FENTANYL CITRATE (PF) 100 MCG/2ML IJ SOLN
INTRAMUSCULAR | Status: AC
  Filled 2018-01-30: qty 2

## 2018-01-30 MED ORDER — HEPARIN SODIUM (PORCINE) 1000 UNIT/ML IJ SOLN
INTRAMUSCULAR | Status: DC | PRN
  Administered 2018-01-30: 5500 [IU] via INTRAVENOUS

## 2018-01-30 MED ORDER — VERAPAMIL HCL 2.5 MG/ML IV SOLN
INTRAVENOUS | Status: DC | PRN
  Administered 2018-01-30: 2.5 mg via INTRA_ARTERIAL

## 2018-01-30 MED ORDER — SODIUM CHLORIDE 0.9% FLUSH: 3.0000 mL | Freq: Two times a day (BID) | INTRAVENOUS | Status: DC

## 2018-01-30 MED ORDER — SODIUM CHLORIDE 0.9 % WEIGHT BASED INFUSION
3.0000 mL/kg/h | INTRAVENOUS | Status: AC
  Administered 2018-01-30: 3 mL/kg/h via INTRAVENOUS

## 2018-01-30 MED ORDER — ONDANSETRON HCL 4 MG/2ML IJ SOLN: 4.0000 mg | Freq: Four times a day (QID) | INTRAMUSCULAR | Status: DC | PRN

## 2018-01-30 MED ORDER — ASPIRIN 81 MG PO CHEW
CHEWABLE_TABLET | ORAL | Status: AC
  Filled 2018-01-30: qty 1

## 2018-01-30 MED ORDER — ACETAMINOPHEN 325 MG PO TABS: 650.0000 mg | ORAL_TABLET | ORAL | Status: DC | PRN

## 2018-01-30 SURGICAL SUPPLY — 13 items
CATH INFINITI 5 FR JL3.5 (CATHETERS) ×2 IMPLANT
CATH INFINITI 5FR ANG PIGTAIL (CATHETERS) ×2 IMPLANT
CATH INFINITI 5FR JL4 (CATHETERS) IMPLANT
CATH INFINITI JR4 5F (CATHETERS) ×2 IMPLANT
DEVICE RAD TR BAND REGULAR (VASCULAR PRODUCTS) ×2 IMPLANT
GLIDESHEATH SLEND SS 6F .021 (SHEATH) ×2 IMPLANT
KIT MANI 3VAL PERCEP (MISCELLANEOUS) ×3 IMPLANT
NDL PERC 18GX7CM (NEEDLE) IMPLANT
NEEDLE PERC 18GX7CM (NEEDLE) IMPLANT
PACK CARDIAC CATH (CUSTOM PROCEDURE TRAY) ×3 IMPLANT
SHEATH AVANTI 5FR X 11CM (SHEATH) IMPLANT
WIRE GUIDERIGHT .035X150 (WIRE) IMPLANT
WIRE ROSEN-J .035X260CM (WIRE) ×2 IMPLANT

## 2018-02-01 ENCOUNTER — Telehealth: Payer: Self-pay

## 2018-02-01 NOTE — Telephone Encounter (Signed)
Refer to a different dermatology office urgently   [place]

## 2018-02-01 NOTE — Telephone Encounter (Signed)
Please advise 

## 2018-02-01 NOTE — Telephone Encounter (Signed)
Copied from [place] [id number]. Topic: General - Other >> Feb 01, 2018 12:39 PM Yvette Rack wrote: Reason for CRM: pt calling stating that the Dermatology that was referred for him called him and told him that he had an appt today   he went to office they turned him away because he has humana and that he need to be seen he states that his face broke out last night and need a shot for it >> Feb 01, 2018  1:36 PM Selinda Flavin B, NT wrote: Patient calling and states that he needs something today because his face is broke out. States that he can not wait until Monday. Please advise.

## 2018-02-04 NOTE — Telephone Encounter (Signed)
Referral sent to [facility] in [place]

## 2018-02-05 ENCOUNTER — Ambulatory Visit (HOSPITAL_BASED_OUTPATIENT_CLINIC_OR_DEPARTMENT_OTHER): Payer: Medicare HMO | Admitting: Pain Medicine

## 2018-02-05 ENCOUNTER — Ambulatory Visit: Payer: Self-pay

## 2018-02-05 DIAGNOSIS — Z7901 Long term (current) use of anticoagulants: Secondary | ICD-10-CM

## 2018-02-05 DIAGNOSIS — M25561 Pain in right knee: Secondary | ICD-10-CM

## 2018-02-05 DIAGNOSIS — M25562 Pain in left knee: Secondary | ICD-10-CM

## 2018-02-05 DIAGNOSIS — G8929 Other chronic pain: Secondary | ICD-10-CM

## 2018-02-05 DIAGNOSIS — M1711 Unilateral primary osteoarthritis, right knee: Secondary | ICD-10-CM

## 2018-02-05 DIAGNOSIS — M170 Bilateral primary osteoarthritis of knee: Secondary | ICD-10-CM

## 2018-02-05 NOTE — Progress Notes (Signed)
NO SHOW. His appointment was at 12:30 pm and he called at 1:23 pm to see if he could still come in. He was rescheduled.

## 2018-02-05 NOTE — Telephone Encounter (Signed)
Pt call returned. Pt had concerns of possibly falling because of pain in both knees. Pt states that it is worse in the right knee. Pt stated he missed his appointment today with the pain clinic but rescheduled today for  pain management clinic at [facility] 02/07/18. Pt was instructed to keep off his feet as much as possible. Pain and inflammation can be reduced with elevation heat or Ice application. Caution when ambulating. Could consider a walker to steady his gait.  Pt was reminded of place and time of pain clinic appointment. Pt encouraged to call back with further concerns. Care advice given to patient. Patient verbalized understanding of all instructions.  Reason for Disposition . Leg pain or muscle cramp is a chronic symptom (recurrent or ongoing AND present > 4 weeks)  Answer Assessment - Initial Assessment Questions 1. ONSET: "When did the pain start?"      A wile back 2. LOCATION: "Where is the pain located?"      Knee  Both  Rt is worse 3. PAIN: "How bad is the pain?"    (Scale 1-10; or mild, moderate, severe)   -  MILD (1-3): doesn't interfere with normal activities    -  MODERATE (4-7): interferes with normal activities (e.g., work or school) or awakens from sleep, limping    -  SEVERE (8-10): excruciating pain, unable to do any normal activities, unable to walk     moderate 4. WORK OR EXERCISE: "Has there been any recent work or exercise that involved this part of the body?"      no 5. CAUSE: "What do you think is causing the leg pain?"     Bone on bone 6. OTHER SYMPTOMS: "Do you have any other symptoms?" (e.g., chest pain, back pain, breathing difficulty, swelling, rash, fever, numbness, weakness)     Warm to touch rt knee 7. PREGNANCY: "Is there any chance you are pregnant?" "When was your last menstrual period?"     N/A  Protocols used: LEG PAIN-A-AH

## 2018-02-06 DIAGNOSIS — I10 Essential (primary) hypertension: Secondary | ICD-10-CM | POA: Diagnosis not present

## 2018-02-06 DIAGNOSIS — E119 Type 2 diabetes mellitus without complications: Secondary | ICD-10-CM | POA: Diagnosis not present

## 2018-02-06 DIAGNOSIS — I2510 Atherosclerotic heart disease of native coronary artery without angina pectoris: Secondary | ICD-10-CM | POA: Diagnosis not present

## 2018-02-07 ENCOUNTER — Other Ambulatory Visit: Payer: Self-pay

## 2018-02-07 ENCOUNTER — Ambulatory Visit: Payer: Self-pay | Admitting: *Deleted

## 2018-02-07 ENCOUNTER — Ambulatory Visit: Payer: Medicare HMO | Attending: Pain Medicine | Admitting: Pain Medicine

## 2018-02-07 ENCOUNTER — Telehealth: Payer: Self-pay | Admitting: Internal Medicine

## 2018-02-07 VITALS — BP 123/79 | HR 86 | Temp 98.1°F | Resp 18 | Ht 67.0 in | Wt 240.0 lb

## 2018-02-07 DIAGNOSIS — M25562 Pain in left knee: Secondary | ICD-10-CM | POA: Insufficient documentation

## 2018-02-07 DIAGNOSIS — Z7901 Long term (current) use of anticoagulants: Secondary | ICD-10-CM

## 2018-02-07 DIAGNOSIS — Z7982 Long term (current) use of aspirin: Secondary | ICD-10-CM | POA: Diagnosis not present

## 2018-02-07 DIAGNOSIS — M170 Bilateral primary osteoarthritis of knee: Secondary | ICD-10-CM | POA: Diagnosis not present

## 2018-02-07 DIAGNOSIS — Z955 Presence of coronary angioplasty implant and graft: Secondary | ICD-10-CM | POA: Insufficient documentation

## 2018-02-07 DIAGNOSIS — Z7902 Long term (current) use of antithrombotics/antiplatelets: Secondary | ICD-10-CM | POA: Insufficient documentation

## 2018-02-07 DIAGNOSIS — G8929 Other chronic pain: Secondary | ICD-10-CM | POA: Diagnosis not present

## 2018-02-07 DIAGNOSIS — Z7984 Long term (current) use of oral hypoglycemic drugs: Secondary | ICD-10-CM | POA: Diagnosis not present

## 2018-02-07 DIAGNOSIS — M25561 Pain in right knee: Secondary | ICD-10-CM | POA: Insufficient documentation

## 2018-02-07 DIAGNOSIS — Z9049 Acquired absence of other specified parts of digestive tract: Secondary | ICD-10-CM | POA: Diagnosis not present

## 2018-02-07 DIAGNOSIS — M1711 Unilateral primary osteoarthritis, right knee: Secondary | ICD-10-CM

## 2018-02-07 MED ORDER — LIDOCAINE HCL (PF) 1 % IJ SOLN
INTRAMUSCULAR | Status: AC
  Filled 2018-02-07: qty 5

## 2018-02-07 MED ORDER — SODIUM HYALURONATE (VISCOSUP) 20 MG/2ML IX SOSY
2.0000 mL | PREFILLED_SYRINGE | Freq: Once | INTRA_ARTICULAR | Status: AC
  Administered 2018-02-07: 2 mL via INTRA_ARTICULAR

## 2018-02-07 MED ORDER — LIDOCAINE HCL (PF) 1 % IJ SOLN
4.0000 mL | Freq: Once | INTRAMUSCULAR | Status: AC
  Administered 2018-02-07: 5 mL

## 2018-02-07 MED ORDER — ROPIVACAINE HCL 2 MG/ML IJ SOLN
INTRAMUSCULAR | Status: AC
  Filled 2018-02-07: qty 10

## 2018-02-07 MED ORDER — ROPIVACAINE HCL 2 MG/ML IJ SOLN
4.0000 mL | Freq: Once | INTRAMUSCULAR | Status: AC
  Administered 2018-02-07: 10 mL via INTRA_ARTICULAR

## 2018-02-07 NOTE — Telephone Encounter (Signed)
Please advise. Patient was triaged by [facility] Nurse and given recommendations.

## 2018-02-07 NOTE — Telephone Encounter (Signed)
Noted  TMS 

## 2018-02-07 NOTE — Telephone Encounter (Signed)
Paperwork has been placed on providers desk for signature.

## 2018-02-07 NOTE — Patient Instructions (Addendum)
____________________________________________________________________________________________  Post-Procedure Discharge Instructions  Instructions:  Apply ice: Fill a plastic sandwich bag with crushed ice. Cover it with a small towel and apply to injection site. Apply for 15 minutes then remove x 15 minutes. Repeat sequence on day of procedure, until you go to bed. The purpose is to minimize swelling and discomfort after procedure.  Apply heat: Apply heat to procedure site starting the day following the procedure. The purpose is to treat any soreness and discomfort from the procedure.  Food intake: Start with clear liquids (like water) and advance to regular food, as tolerated.   Physical activities: Keep activities to a minimum for the first 8 hours after the procedure.   Driving: If you have received any sedation, you are not allowed to drive for 24 hours after your procedure.  Blood thinner: Restart your blood thinner 6 hours after your procedure. (Only for those taking blood thinners)  Insulin: As soon as you can eat, you may resume your normal dosing schedule. (Only for those taking insulin)  Infection prevention: Keep procedure site clean and dry.  Post-procedure Pain Diary: Extremely important that this be done correctly and accurately. Recorded information will be used to determine the next step in treatment.  Pain evaluated is that of treated area only. Do not include pain from an untreated area.  Complete every hour, on the hour, for the initial 8 hours. Set an alarm to help you do this part accurately.  Do not go to sleep and have it completed later. It will not be accurate.  Follow-up appointment: Keep your follow-up appointment after the procedure. Usually 2 weeks for most procedures. (6 weeks in the case of radiofrequency.) Bring you pain diary.   Expect:  From numbing medicine (AKA: Local Anesthetics): Numbness or decrease in pain.  Onset: Full effect within 15  minutes of injected.  Duration: It will depend on the type of local anesthetic used. On the average, 1 to 8 hours.   From steroids: Decrease in swelling or inflammation. Once inflammation is improved, relief of the pain will follow.  Onset of benefits: Depends on the amount of swelling present. The more swelling, the longer it will take for the benefits to be seen. In some cases, up to 10 days.  Duration: Steroids will stay in the system x 2 weeks. Duration of benefits will depend on multiple posibilities including persistent irritating factors.  Occasional side-effects: Facial flushing, cramps (if present, drink Gatorade and take over-the-counter Magnesium 450-500 mg once to twice a day).  From procedure: Some discomfort is to be expected once the numbing medicine wears off. This should be minimal if ice and heat are applied as instructed.  Call if:  You experience numbness and weakness that gets worse with time, as opposed to wearing off.  New onset bowel or bladder incontinence. (This applies to Spinal procedures only)  Emergency Numbers:  Durning business hours (Monday - Thursday, 8:00 AM - 4:00 PM) (Friday, 9:00 AM - 12:00 Noon): [phone]  After hours: [phone] ____________________________________________________________________________________________   ____________________________________________________________________________________________  Preparing for your procedure (without sedation)  Instructions: . Oral Intake: Do not eat or drink anything for at least 3 hours prior to your procedure. . Transportation: Unless otherwise stated by your physician, you may drive yourself after the procedure. . Blood Pressure Medicine: Take your blood pressure medicine with a sip of water the morning of the procedure. . Blood thinners: Notify our staff if you are taking any blood thinners. Depending on which   one you take, there will be specific instructions on how and when  to stop it. . Diabetics on insulin: Notify the staff so that you can be scheduled 1st case in the morning. If your diabetes requires high dose insulin, take only  of your normal insulin dose the morning of the procedure and notify the staff that you have done so. . Preventing infections: Shower with an antibacterial soap the morning of your procedure.  . Build-up your immune system: Take 1000 mg of Vitamin C with every meal (3 times a day) the day prior to your procedure. Marland Kitchen Antibiotics: Inform the staff if you have a condition or reason that requires you to take antibiotics before dental procedures. . Pregnancy: If you are pregnant, call and cancel the procedure. . Sickness: If you have a cold, fever, or any active infections, call and cancel the procedure. . Arrival: You must be in the facility at least 30 minutes prior to your scheduled procedure. . Children: Do not bring any children with you. . Dress appropriately: Bring dark clothing that you would not mind if they get stained. . Valuables: Do not bring any jewelry or valuables.  Procedure appointments are reserved for interventional treatments only. Marland Kitchen No Prescription Refills. . No medication changes will be discussed during procedure appointments. . No disability issues will be discussed.  Reasons to call and reschedule or cancel your procedure: (Following these recommendations will minimize the risk of a serious complication.) . Surgeries: Avoid having procedures within 2 weeks of any surgery. (Avoid for 2 weeks before or after any surgery). . Flu Shots: Avoid having procedures within 2 weeks of a flu shots or . (Avoid for 2 weeks before or after immunizations). . Barium: Avoid having a procedure within 7-10 days after having had a radiological study involving the use of radiological contrast. (Myelograms, Barium swallow or enema study). . Heart attacks: Avoid any elective procedures or surgeries for the initial 6 months after a  "Myocardial Infarction" (Heart Attack). . Blood thinners: It is imperative that you stop these medications before procedures. Let us know if you if you take any blood thinner.  . Infection: Avoid procedures during or within two weeks of an infection (including chest colds or gastrointestinal problems). Symptoms associated with infections include: Localized redness, fever, chills, night sweats or profuse sweating, burning sensation when voiding, cough, congestion, stuffiness, runny nose, sore throat, diarrhea, nausea, vomiting, cold or Flu symptoms, recent or current infections. It is specially important if the infection is over the area that we intend to treat. Marland Kitchen Heart and lung problems: Symptoms that may suggest an active cardiopulmonary problem include: cough, chest pain, breathing difficulties or shortness of breath, dizziness, ankle swelling, uncontrolled high or unusually low blood pressure, and/or palpitations. If you are experiencing any of these symptoms, cancel your procedure and contact your primary care physician for an evaluation.  Remember:  Regular Business hours are:  Monday to Thursday 8:00 AM to 4:00 PM  Provider's Schedule: Milinda Pointer, MD:  Procedure days: Tuesday and Thursday 7:30 AM to 4:00 PM  Gillis Santa, MD:  Procedure days: Monday and Wednesday 7:30 AM to 4:00 PM ____________________________________________________________________________________________ Nicholas Reyes may restart your Plavix tomorrow. Stop Plavix 7 days before next knee injection.

## 2018-02-07 NOTE — Telephone Encounter (Signed)
Patient with known extensive coronary heart disease. Patient calling with increased bilateral below the knee and ankle/foot swelling. Stated he took a fluid pill last night, had good output but the swelling has not gone down today. He reported he had CP with some SOB last night prior to taking the lasix so he took NTG SL and then one more NTG in order for the pain to subside.He saw Cardiologist yesterday for the chest pain. Echocardiogram done, diet education and continue current medication regimen was documented as the after visit plan. He stated he did told the Dr. About his leg edema. His is able to void today and had not taken lasix as of yet. Stated he had not called Cards today and asked if we could call them for him.  Instructed him to take lasix now. Routing to PCP for advice.  Answer Assessment - Initial Assessment Questions 1. ONSET: "When did the swelling start?" (e.g., minutes, hours, days)     Both legs below the knee with increased edema 2. LOCATION: "What part of the leg is swollen?"  "Are both legs swollen or just one leg?"     Below both knees 3. SEVERITY: "How bad is the swelling?" (e.g., localized; mild, moderate, severe)  - Localized - small area of swelling localized to one leg  - MILD pedal edema - swelling limited to foot and ankle, pitting edema < 1/4 inch (6 mm) deep, rest and elevation eliminate most or all swelling  - MODERATE edema - swelling of lower leg to knee, pitting edema > 1/4 inch (6 mm) deep, rest and elevation only partially reduce swelling  - SEVERE edema - swelling extends above knee, facial or hand swelling present      moderate 4. REDNESS: "Does the swelling look red or infected?"     no 5. PAIN: "Is the swelling painful to touch?" If so, ask: "How painful is it?"   (Scale 1-10; mild, moderate or severe)     Slightly painful to touch 6. FEVER: "Do you have a fever?" If so, ask: "What is it, how was it measured, and when did it start?"      No fever but  legs and feet feel warm to touch 7. CAUSE: "What do you think is causing the leg swelling?"    Does not know 8. MEDICAL HISTORY: "Do you have a history of heart failure, kidney disease, liver failure, or cancer?"     Heart disease and kidney disease.  9. RECURRENT SYMPTOM: "Have you had leg swelling before?" If so, ask: "When was the last time?" "What happened that time?"     This happens then he takes a fluid pill and the swelling goes down. This time, the swelling is not going down. 10. OTHER SYMPTOMS: "Do you have any other symptoms?" (e.g., chest pain, difficulty breathing)       Stated he had cp and shortness of breath last night then took ntg sl two times to relieve the pain. 11. PREGNANCY: "Is there any chance you are pregnant?" "When was your last menstrual period?"       na  Protocols used: LEG SWELLING AND EDEMA-A-AH

## 2018-02-07 NOTE — Telephone Encounter (Signed)
Pt dropped off disability parking placard form to be filled out. He would like this mailed to hom when completed  Placed in Dr. Audrie Gallus colored folder upfront

## 2018-02-07 NOTE — Progress Notes (Addendum)
Patient's Name: Nicholas Reyes  MRN: [id number]  Referring Provider: Orland Reyes *  DOB: [birth year]  PCP: McLean-Scocuzza, Nicholas Glow, MD  DOS: 02/07/2018  Note by: Gaspar Cola, MD  Service setting: Ambulatory outpatient  Specialty: Interventional Pain Management  Patient type: Established  Location: [facility] (AMB) Pain Management Facility  Visit type: Interventional Procedure   Primary Reason for Visit: Interventional Pain Management Treatment. CC: Knee Pain (bilateral)  Procedure:          Anesthesia, Analgesia, Anxiolysis:  Type: Therapeutic Intra-Articular Hyalgan Knee Injection #1  Region: Lateral infrapatellar Knee Region Level: Knee Joint Laterality: Bilateral  Type: Local Anesthesia Indication(s): Analgesia         Local Anesthetic: Lidocaine 1-2% Route: Infiltration ([place]/IM) IV Access: Declined Sedation: Declined   Position: Sitting   Indications: 1. 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 Chronic knee pain (Primary Area of Pain) (Bilateral) (R>L)   3. Tricompartment osteoarthritis of knee (Right)   4. Chronic anticoagulation (PLAVIX)    Pain Score: Pre-procedure: 8 /10 Post-procedure: 0-No pain/10  Pre-op Assessment:  [name] is a 62 y.o. (year old), male patient, seen today for interventional treatment. He  has a past surgical history that includes Cataract extraction (Bilateral); Cholecystectomy; Appendectomy; Tonsillectomy; STENT PLACEMENT VASCULAR ([place] HX); right knee surgery; Tonsillectomy and adenoidectomy; hemorrhoid surgery ; Eye surgery; Coronary angioplasty with stent; Hemorrhoid surgery; Knee surgery; and LEFT HEART CATH AND CORONARY ANGIOGRAPHY (Left, 01/30/2018). [name] has a current medication list which includes the following prescription(s): albuterol, amlodipine, aspirin, atorvastatin, budesonide-formoterol, cetirizine, cholecalciferol, clopidogrel, diclofenac, doxycycline, duloxetine, furosemide, hydroxyzine, isosorbide mononitrate,  ketorolac, meloxicam, metformin, metoprolol tartrate, montelukast, mupirocin ointment, nitroglycerin, ondansetron, pantoprazole, prednisolone acetate, ranitidine, secukinumab, spironolactone, and triamcinolone cream. His primarily concern today is the Knee Pain (bilateral)  Initial Vital Signs:  Pulse/HCG Rate: 80  Temp: 98.1 F (36.7 C) Resp: 18 BP: 140/69 SpO2: 91 %  BMI: Estimated body mass index is 37.59 kg/m as calculated from the following:   Height as of this encounter: 5\' 7"  (1.702 m).   Weight as of this encounter: 240 lb (108.9 kg).  Risk Assessment: Allergies: Reviewed. He has No Known Allergies.  Allergy Precautions: None required Coagulopathies: Reviewed. None identified.  Blood-thinner therapy: None at this time Active Infection(s): Reviewed. None identified. [name] is afebrile  Site Confirmation: [name] was asked to confirm the procedure and laterality before marking the site Procedure checklist: Completed Consent: Before the procedure and under the influence of no sedative(s), amnesic(s), or anxiolytics, the patient was informed of the treatment options, risks and possible complications. To fulfill our ethical and legal obligations, as recommended by the American Medical Association's Code of Ethics, I have informed the patient of my clinical impression; the nature and purpose of the treatment or procedure; the risks, benefits, and possible complications of the intervention; the alternatives, including doing nothing; the risk(s) and benefit(s) of the alternative treatment(s) or procedure(s); and the risk(s) and benefit(s) of doing nothing. The patient was provided information about the general risks and possible complications associated with the procedure. These may include, but are not limited to: failure to achieve desired goals, infection, bleeding, organ or nerve damage, allergic reactions, paralysis, and death. In addition, the patient was informed of those  risks and complications associated to the procedure, such as failure to decrease pain; infection; bleeding; organ or nerve damage with subsequent damage to sensory, motor, and/or autonomic systems, resulting in permanent pain, numbness, and/or weakness of one or several areas of  the body; allergic reactions; (i.e.: anaphylactic reaction); and/or death. Furthermore, the patient was informed of those risks and complications associated with the medications. These include, but are not limited to: allergic reactions (i.e.: anaphylactic or anaphylactoid reaction(s)); adrenal axis suppression; blood sugar elevation that in diabetics may result in ketoacidosis or comma; water retention that in patients with history of congestive heart failure may result in shortness of breath, pulmonary edema, and decompensation with resultant heart failure; weight gain; swelling or edema; medication-induced neural toxicity; particulate matter embolism and blood vessel occlusion with resultant organ, and/or nervous system infarction; and/or aseptic necrosis of one or more joints. Finally, the patient was informed that Medicine is not an exact science; therefore, there is also the possibility of unforeseen or unpredictable risks and/or possible complications that may result in a catastrophic outcome. The patient indicated having understood very clearly. We have given the patient no guarantees and we have made no promises. Enough time was given to the patient to ask questions, all of which were answered to the patient's satisfaction. [name] has indicated that he wanted to continue with the procedure. Attestation: I, the ordering provider, attest that I have discussed with the patient the benefits, risks, side-effects, alternatives, likelihood of achieving goals, and potential problems during recovery for the procedure that I have provided informed consent. Date  Time: 02/07/2018 10:29 AM  Pre-Procedure Preparation:  Monitoring: As  per clinic protocol. Respiration, ETCO2, SpO2, BP, heart rate and rhythm monitor placed and checked for adequate function Safety Precautions: Patient was assessed for positional comfort and pressure points before starting the procedure. Time-out: I initiated and conducted the "Time-out" before starting the procedure, as per protocol. The patient was asked to participate by confirming the accuracy of the "Time Out" information. Verification of the correct person, site, and procedure were performed and confirmed by me, the nursing staff, and the patient. "Time-out" conducted as per Joint Commission's Universal Protocol (UP.01.01.01). Time: 1054  Description of Procedure:          Target Area: Knee Joint Approach: Just above the Lateral tibial plateau, lateral to the infrapatellar tendon. Area Prepped: Entire knee area, from the mid-thigh to the mid-shin. Prepping solution: ChloraPrep (2% chlorhexidine gluconate and 70% isopropyl alcohol) Safety Precautions: Aspiration looking for blood return was conducted prior to all injections. At no point did we inject any substances, as a needle was being advanced. No attempts were made at seeking any paresthesias. Safe injection practices and needle disposal techniques used. Medications properly checked for expiration dates. SDV (single dose vial) medications used. Description of the Procedure: Protocol guidelines were followed. The patient was placed in position over the fluoroscopy table. The target area was identified and the area prepped in the usual manner. Skin & deeper tissues infiltrated with local anesthetic. Appropriate amount of time allowed to pass for local anesthetics to take effect. The procedure needles were then advanced to the target area. Proper needle placement secured. Negative aspiration confirmed. Solution injected in intermittent fashion, asking for systemic symptoms every 0.5cc of injectate. The needles were then removed and the area cleansed,  making sure to leave some of the prepping solution back to take advantage of its long term bactericidal properties. Vitals:   02/07/18 1023 02/07/18 1058  BP: 140/69 123/79  Pulse: 80 86  Resp: 18 18  Temp: 98.1 F (36.7 C)   TempSrc: Oral   SpO2: 91% 90%  Weight: 240 lb (108.9 kg)   Height: 5\' 7"  (1.702 m)  Start Time: 1054 hrs. End Time: 1056 hrs. Materials:  Needle(s) Type: Regular needle Gauge: 25G Length: 1.5-in Medication(s): Please see orders for medications and dosing details. Imaging Guidance:          Type of Imaging Technique: None used Indication(s): N/A Exposure Time: No patient exposure Contrast: None used. Fluoroscopic Guidance: N/A Ultrasound Guidance: N/A Interpretation: N/A  Antibiotic Prophylaxis:   Anti-infectives (From admission, onward)   None     Indication(s): None identified  Post-operative Assessment:  Post-procedure Vital Signs:  Pulse/HCG Rate: 86  Temp: 98.1 F (36.7 C) Resp: 18 BP: 123/79 SpO2: 90 %  EBL: None  Complications: No immediate post-treatment complications observed by team, or reported by patient.  Note: The patient tolerated the entire procedure well. A repeat set of vitals were taken after the procedure and the patient was kept under observation following institutional policy, for this type of procedure. Post-procedural neurological assessment was performed, showing return to baseline, prior to discharge. The patient was provided with post-procedure discharge instructions, including a section on how to identify potential problems. Should any problems arise concerning this procedure, the patient was given instructions to immediately contact us, at any time, without hesitation. In any case, we plan to contact the patient by telephone for a follow-up status report regarding this interventional procedure.  Comments:  No additional relevant information.  Plan of Care   Imaging Orders  No imaging studies ordered today     Procedure Orders     KNEE INJECTION     KNEE INJECTION  Medications ordered for procedure: Meds ordered this encounter  Medications  . lidocaine (PF) (XYLOCAINE) 1 % injection 4 mL  . ropivacaine (PF) 2 mg/mL (0.2%) (NAROPIN) injection 4 mL  . Sodium Hyaluronate SOSY 2 mL  . Sodium Hyaluronate SOSY 2 mL   Medications administered: We administered lidocaine (PF), ropivacaine (PF) 2 mg/mL (0.2%), Sodium Hyaluronate, and Sodium Hyaluronate.  See the medical record for exact dosing, route, and time of administration.  New Prescriptions   No medications on file   Disposition: Discharge home  Discharge Date & Time: 02/07/2018; 1100 hrs.   Physician-requested Follow-up: Return for PPE (2 wks) + Procedure (no sedation): (B) Hyalgan #3.  Future Appointments  Date Time Provider [place]  02/27/2018  2:00 PM McLean-Scocuzza, Nicholas Glow, MD [facility]  03/11/2018  9:00 AM Rockey Situ, Kathlene November, MD [facility]   Primary Care Physician: McLean-Scocuzza, Nicholas Glow, MD Location: [facility] Outpatient Pain Management Facility Note by: Gaspar Cola, MD Date: 02/07/2018; Time: 1:16 PM  Disclaimer:  Medicine is not an exact science. The only guarantee in medicine is that nothing is guaranteed. It is important to note that the decision to proceed with this intervention was based on the information collected from the patient. The Data and conclusions were drawn from the patient's questionnaire, the interview, and the physical examination. Because the information was provided in large part by the patient, it cannot be guaranteed that it has not been purposely or unconsciously manipulated. Every effort has been made to obtain as much relevant data as possible for this evaluation. It is important to note that the conclusions that lead to this procedure are derived in large part from the available data. Always take into account that the treatment will also be dependent on availability of  resources and existing treatment guidelines, considered by other Pain Management Practitioners as being common knowledge and practice, at the time of the intervention. For Medico-Legal purposes, it is also important  to point out that variation in procedural techniques and pharmacological choices are the acceptable norm. The indications, contraindications, technique, and results of the above procedure should only be interpreted and judged by a Board-Certified Interventional Pain Specialist with extensive familiarity and expertise in the same exact procedure and technique.

## 2018-02-07 NOTE — Telephone Encounter (Signed)
Patient called back and stated that he just woke up from nap and he still has bilateral leg swelling  from knee down and legs are  still swollen.  He can  hardly walk due to swelling in legs and feet.  He denies chest pain or shortness of breath.  I inquired with patient if he did take lasix as instructed by triage RN earlier today and he stated he did and has not noticed any difference.   He reports he is voiding.  I instructed patient he would need to go to ER due to his extensive cardiac history.  Patient agreed.

## 2018-02-08 ENCOUNTER — Telehealth: Payer: Self-pay | Admitting: *Deleted

## 2018-02-08 NOTE — Telephone Encounter (Signed)
Callback re; procedure.  Voicemail left to call our office if there are questions or concerns.

## 2018-02-11 ENCOUNTER — Telehealth: Payer: Self-pay

## 2018-02-11 NOTE — Telephone Encounter (Signed)
Copied from [place] [id number]. 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  Please call pt when paperwork is completed.  [phone]

## 2018-02-12 ENCOUNTER — Ambulatory Visit: Payer: Self-pay

## 2018-02-12 NOTE — Telephone Encounter (Signed)
Pt. Reports abdominal pain x 3 days. Right upper quadrant of abdomen near his ribs. Does not radiate. Constant. Last BM today. Only other symptom is dizziness when he lays down - this goes away after laying down a few minutes. Appointment made for tomorrow morning. Instructed if pain worsens before appointment, go to ED for evaluation.Verbalizes understanding.  Reason for Disposition . [1] MODERATE pain (e.g., interferes with normal activities) AND [2] pain comes and goes (cramps) AND [3] present > 24 hours  (Exception: pain with Vomiting or Diarrhea - see that Guideline)  Answer Assessment - Initial Assessment Questions 1. LOCATION: "Where does it hurt?"      Right side of abdomen near his ribs 2. RADIATION: "Does the pain shoot anywhere else?" (e.g., chest, back)     No 3. ONSET: "When did the pain begin?" (Minutes, hours or days ago)      3 days ago 4. SUDDEN: "Gradual or sudden onset?"     Gradual 5. PATTERN "Does the pain come and go, or is it constant?"    - If constant: "Is it getting better, staying the same, or worsening?"      (Note: Constant means the pain never goes away completely; most serious pain is constant and it progresses)     - If intermittent: "How long does it last?" "Do you have pain now?"     (Note: Intermittent means the pain goes away completely between bouts)     Constant 6. SEVERITY: "How bad is the pain?"  (e.g., Scale 1-10; mild, moderate, or severe)    - MILD (1-3): doesn't interfere with normal activities, abdomen soft and not tender to touch     - MODERATE (4-7): interferes with normal activities or awakens from sleep, tender to touch     - SEVERE (8-10): excruciating pain, doubled over, unable to do any normal activities       8 7. RECURRENT SYMPTOM: "Have you ever had this type of abdominal pain before?" If so, ask: "When was the last time?" and "What happened that time?"      No 8. CAUSE: "What do you think is causing the abdominal pain?"     No 9.  RELIEVING/AGGRAVATING FACTORS: "What makes it better or worse?" (e.g., movement, antacids, bowel movement)     No 10. OTHER SYMPTOMS: "Has there been any vomiting, diarrhea, constipation, or urine problems?"       No  Protocols used: ABDOMINAL PAIN - MALE-A-AH

## 2018-02-12 NOTE — Telephone Encounter (Signed)
Patient triaged by [facility] nurse and has been scheduled for an appointment.

## 2018-02-13 ENCOUNTER — Telehealth: Payer: Self-pay | Admitting: Internal Medicine

## 2018-02-13 ENCOUNTER — Ambulatory Visit
Admission: RE | Admit: 2018-02-13 | Discharge: 2018-02-13 | Disposition: A | Discharge status: Home / Self Care | Payer: Medicare HMO | Source: Ambulatory Visit | Attending: Family | Admitting: Family

## 2018-02-13 ENCOUNTER — Other Ambulatory Visit: Payer: Self-pay | Admitting: Family

## 2018-02-13 ENCOUNTER — Encounter: Payer: Self-pay | Admitting: Family

## 2018-02-13 ENCOUNTER — Ambulatory Visit (INDEPENDENT_AMBULATORY_CARE_PROVIDER_SITE_OTHER): Payer: Medicare HMO | Admitting: Family

## 2018-02-13 VITALS — Temp 97.8°F | Resp 18 | Ht 67.0 in | Wt 234.5 lb

## 2018-02-13 DIAGNOSIS — L989 Disorder of the skin and subcutaneous tissue, unspecified: Secondary | ICD-10-CM | POA: Diagnosis not present

## 2018-02-13 DIAGNOSIS — R1011 Right upper quadrant pain: Secondary | ICD-10-CM | POA: Diagnosis not present

## 2018-02-13 DIAGNOSIS — R109 Unspecified abdominal pain: Secondary | ICD-10-CM | POA: Diagnosis not present

## 2018-02-13 DIAGNOSIS — I10 Essential (primary) hypertension: Secondary | ICD-10-CM

## 2018-02-13 DIAGNOSIS — N281 Cyst of kidney, acquired: Secondary | ICD-10-CM

## 2018-02-13 DIAGNOSIS — I700 Atherosclerosis of aorta: Secondary | ICD-10-CM | POA: Insufficient documentation

## 2018-02-13 LAB — COMPREHENSIVE METABOLIC PANEL
ALBUMIN: 4.2 g/dL (ref 3.5–5.2)
ALT: 12 U/L (ref 0–53)
AST: 13 U/L (ref 0–37)
Alkaline Phosphatase: 56 U/L (ref 39–117)
BILIRUBIN TOTAL: 0.3 mg/dL (ref 0.2–1.2)
BUN: 11 mg/dL (ref 6–23)
CO2: 35 mEq/L — ABNORMAL HIGH (ref 19–32)
Calcium: 9.6 mg/dL (ref 8.4–10.5)
Chloride: 99 mEq/L (ref 96–112)
Creatinine, Ser: 0.86 mg/dL (ref 0.40–1.50)
GFR: 96.03 mL/min (ref >60.00)
GLUCOSE: 104 mg/dL — AB (ref 70–99)
POTASSIUM: 4.1 meq/L (ref 3.5–5.1)
SODIUM: 139 meq/L (ref 135–145)
Total Protein: 7.6 g/dL (ref 6.0–8.3)

## 2018-02-13 LAB — CBC WITH DIFFERENTIAL/PLATELET
BASOS ABS: 0 10*3/uL (ref 0.0–0.1)
Basophils Relative: 0.9 % (ref 0.0–3.0)
EOS PCT: 2.1 % (ref 0.0–5.0)
Eosinophils Absolute: 0.1 10*3/uL (ref 0.0–0.7)
HCT: 41.9 % (ref 39.0–52.0)
HEMOGLOBIN: 13.7 g/dL (ref 13.0–17.0)
LYMPHS ABS: 1.9 10*3/uL (ref 0.7–4.0)
Lymphocytes Relative: 38.8 % (ref 12.0–46.0)
MCHC: 32.8 g/dL (ref 30.0–36.0)
MCV: 93.7 fl (ref 78.0–100.0)
MONOS PCT: 9.9 % (ref 3.0–12.0)
Monocytes Absolute: 0.5 10*3/uL (ref 0.1–1.0)
NEUTROS PCT: 48.3 % (ref 43.0–77.0)
Neutro Abs: 2.4 10*3/uL (ref 1.4–7.7)
Platelets: 152 10*3/uL (ref 150.0–400.0)
RBC: 4.47 Mil/uL (ref 4.22–5.81)
RDW: 15.8 % — ABNORMAL HIGH (ref 11.5–15.5)
WBC: 4.9 10*3/uL (ref 4.0–10.5)

## 2018-02-13 LAB — POCT I-STAT CREATININE: Creatinine, Ser: 0.8 mg/dL (ref 0.61–1.24)

## 2018-02-13 LAB — LIPASE: LIPASE: 34 U/L (ref 11.0–59.0)

## 2018-02-13 MED ORDER — IOPAMIDOL (ISOVUE-300) INJECTION 61%
100.0000 mL | Freq: Once | INTRAVENOUS | Status: AC | PRN
  Administered 2018-02-13: 100 mL via INTRAVENOUS

## 2018-02-13 NOTE — Telephone Encounter (Signed)
Copied from [place] [phone]. Topic: Quick Communication - See Telephone Encounter >> Feb 13, 2018  3:26 PM Rutherford Nail, [place] wrote: CRM for notification. See Telephone encounter for: 02/13/18. Patient calling to see about his CT results from 02/13/18. Please advise. Patient would like a call to discuss results.

## 2018-02-13 NOTE — Telephone Encounter (Signed)
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 I did advise patient someone will reach out to him when they are ready.

## 2018-02-13 NOTE — Patient Instructions (Signed)
Etiology of pain is nonspecific at this time  Pending ct abdomen and pelvis, labs to look acute pathology.  I am also considering reflux since worse at night.   Please stay very vigilant and let us  Know immediately of any new or worsening symptoms.   Please keep cell phone on you.

## 2018-02-13 NOTE — Assessment & Plan Note (Signed)
Etiology nonspecific at this time.  Patient is well-appearing in the exam room. He does exhibit some mild tenderness over the right upper quadrant.  History of cholecystectomy.  Also reviewed chart and noted that he was in the emergency room a couple times earlier this year for similar presentation, with no apparent etiology at discharge.    I have asked nurse to speak with patient  about colonoscopy, EGD which he appears due for both.  Differential at this point is nonspecific however considering gastritis, musculoskeletal etiology. He is on protonix.  At this time, I think the most reasonable step is a consult with GI for colonoscopy, EGD.  Advised patient let us know of any new or worsening symptoms.  Pending labs at this time.

## 2018-02-13 NOTE — Assessment & Plan Note (Addendum)
Well controlled today. No CP. Not orthostatic ( see flow sheet). Not dizzy today in office and when supine. Advised to let us know if recurs.

## 2018-02-13 NOTE — Telephone Encounter (Signed)
Charted in result notes.   Copied from [place] [phone]. Topic: Quick Communication - Lab Results >> Feb 13, 2018  4:34 PM Bare, Patriciaann Clan, CMA wrote: Called patient to inform them of  lab results. When patient returns call, triage nurse may disclose results.

## 2018-02-13 NOTE — Progress Notes (Signed)
Subjective:    Patient ID: Nicholas Reyes, male    DOB: [birth year], 62 y.o.   MRN: [id number]  CC: Nicholas Reyes is a 62 y.o. male who presents today for an acute visit.    HPI: CC: abdominal pain x 3-4 days, unchanged. Constant, hasnt moved. Pain is 'light  During the day' , worse at bedtime. Rates 7/10 today.  BM today. No diarrhea, vomiting, belching, epigastric burning, dysuria.  hasnt lifted anything heavy.  Not related to meal. NO etoh   Lays down and 3 episodes when laying down , has felt dizzy. Resolves with rest.  No syncope. No CP, palpitaitns, SOB.   DM- Started metformin 11/2017. Tolerating well.   Continues to follow Dr Josefa Half.   HTN- compliant with medication. Checks BP at home however doesn't recall numbers.    Chronic knee pain- follows with pain management  Sore on back and abdomen - see prior referral for psorsias per pcp; wasn't able to see dermatologist due to insurance   h/o appendectomy, cholesctectomy. No h/o diverticulitis  [facility]achos- cardiac cath 01/30/18- insignificant CAD. Normal left ventricular function HISTORY:  Past Medical History:  Diagnosis Date  . Arthritis   . CAD (coronary artery disease)   . COPD (chronic obstructive pulmonary disease) ([place])   . Depression   . Diabetes mellitus   . Heart disease    cad with 3 stents   . Hypertension   . Migraines   . Psoriasis    joint pain in knees left hand    Past Surgical History:  Procedure Laterality Date  . APPENDECTOMY    . CATARACT EXTRACTION Bilateral    inserted lens b/l eyes right 03/12/17, left 05/28/17   . CHOLECYSTECTOMY    . CORONARY ANGIOPLASTY WITH STENT PLACEMENT     LAD, LCX-OM, ? 3rd location  . EYE SURGERY     cataract b/l   . HEMORRHOID SURGERY    . hemorrhoid surgery      x 2   . KNEE SURGERY     right knee arthroscopy, partial synovectecomy 04/21/15 and meniscectomy/chondroplasty   . LEFT HEART CATH AND CORONARY ANGIOGRAPHY Left 01/30/2018   Procedure: LEFT HEART  CATH AND CORONARY ANGIOGRAPHY;  Surgeon: Isaias Cowman, MD;  Location: [facility];  Service: Cardiovascular;  Laterality: Left;  . right knee surgery     ? type of procedure   . STENT PLACEMENT VASCULAR ([place] [facility])    . TONSILLECTOMY    . TONSILLECTOMY AND ADENOIDECTOMY     Family History  Problem Relation Age of Onset  . Cancer Mother   . Heart disease Father   . Stroke Father     Allergies: Patient has no known allergies. Current Outpatient Medications on File Prior to Visit  Medication Sig Dispense Refill  . albuterol (PROVENTIL HFA;VENTOLIN HFA) 108 (90 BASE) MCG/ACT inhaler Inhale 2 puffs into the lungs every 4 (four) hours as needed. For shortness of breath    . amLODipine (NORVASC) 5 MG tablet Take 5 mg by mouth daily.    Marland Kitchen aspirin 81 MG EC tablet Take 81 mg by mouth daily.     Marland Kitchen atorvastatin (LIPITOR) 20 MG tablet Take 20 mg by mouth daily.    . budesonide-formoterol (SYMBICORT) 80-4.5 MCG/ACT inhaler Inhale 2 puffs into the lungs 2 (two) times daily.    . cetirizine (ZYRTEC) 10 MG tablet Take 10 mg by mouth daily.    . Cholecalciferol 50000 units capsule Take 1 capsule (50,000  Units total) by mouth once a week. 13 capsule 1  . clopidogrel (PLAVIX) 75 MG tablet Take 75 mg by mouth daily.    . diclofenac (FLECTOR) 1.3 % PTCH Place 1 patch onto the skin 2 (two) times daily. 60 patch 2  . doxycycline (VIBRA-TABS) 100 MG tablet Take 1 tablet (100 mg total) by mouth 2 (two) times daily. With food 14 tablet 0  . DULoxetine (CYMBALTA) 60 MG capsule Take 1 capsule (60 mg total) by mouth daily. 90 capsule 3  . furosemide (LASIX) 40 MG tablet Take 40 mg by mouth daily.    . hydrOXYzine (ATARAX/VISTARIL) 25 MG tablet Take 1 tablet (25 mg total) by mouth 3 (three) times daily as needed. 30 tablet 0  . isosorbide mononitrate (IMDUR) 30 MG 24 hr tablet Take 1 tablet (30 mg total) by mouth daily. 90 tablet 3  . ketorolac (ACULAR) 0.5 % ophthalmic solution     . meloxicam  (MOBIC) 7.5 MG tablet Take 1 tablet (7.5 mg total) by mouth 2 (two) times daily as needed for pain. 60 tablet 2  . metFORMIN (GLUCOPHAGE) 500 MG tablet Take 1 tablet (500 mg total) by mouth daily with breakfast. 90 tablet 3  . metoprolol tartrate (LOPRESSOR) 25 MG tablet Take 25 mg by mouth 2 (two) times daily.    . montelukast (SINGULAIR) 10 MG tablet Take 10 mg by mouth at bedtime.    . mupirocin ointment (BACTROBAN) 2 % Apply to affected area 3 times daily 15 g 0  . nitroGLYCERIN (NITROSTAT) 0.4 MG SL tablet Place 0.4 mg under the tongue every 5 (five) minutes as needed. For chest pain    . ondansetron (ZOFRAN) 4 MG tablet Take 1 tablet (4 mg total) by mouth every 8 (eight) hours as needed for nausea or vomiting. 20 tablet 0  . pantoprazole (PROTONIX) 40 MG tablet Take 40 mg by mouth daily.    . prednisoLONE acetate (PRED FORTE) 1 % ophthalmic suspension     . ranitidine (ZANTAC) 150 MG/10ML syrup 150 mg 2 (two) times daily. Taking 10 ml bid    . Secukinumab (COSENTYX 300 DOSE [place]) Inject into the skin every 30 (thirty) days.    Marland Kitchen spironolactone (ALDACTONE) 25 MG tablet Take 25 mg by mouth daily.    Marland Kitchen triamcinolone cream (KENALOG) 0.1 % Apply 1 application topically 2 (two) times daily. Not face, groin, underarms 454 g 11   No current facility-administered medications on file prior to visit.     Social History   Tobacco Use  . Smoking status: Former Research scientist (life sciences)  . Smokeless tobacco: Never Used  Substance Use Topics  . Alcohol use: No    Frequency: Never  . Drug use: No  Review of Systems  Constitutional: Negative for chills and fever.  Respiratory: Negative for cough.   Cardiovascular: Negative for chest pain and palpitations.  Gastrointestinal: Positive for abdominal pain. Negative for abdominal distention, constipation, diarrhea, nausea and vomiting.  Neurological: Negative for dizziness (resolved), numbness and headaches.      Objective:    Temp 97.8 F (36.6 C) (Oral)   Resp 18   Ht 5\' 7"  (1.702 m)   Wt 234 lb 8 oz (106.4 kg)   SpO2 94%   BMI 36.73 kg/m    Physical Exam  Constitutional: He appears well-developed and well-nourished.  Cardiovascular: Regular rhythm and normal heart sounds.  Pulmonary/Chest: Effort normal and breath sounds normal. No respiratory distress. He has no wheezes. He has no rhonchi. He has no rales.  Abdominal: Soft. Normal appearance and bowel sounds are normal. He exhibits no distension, no fluid wave, no ascites and no mass. There is no tenderness. There is no rigidity, no rebound, no guarding, no CVA tenderness, no tenderness at McBurney's point and negative Murphy's sign.  No suprapubic tenderness.  Obese  Some mild tenderness RUQ, not exquisite.  Grimacing.   Neurological: He is alert.  Skin: Skin is warm and dry.  Ulcer like lesions with scabs scattered over abdomen. No purulent discharge, increased  warmth appreciated  Psychiatric: He has a normal mood and affect. His speech is normal and behavior is normal.  Vitals reviewed.      Assessment & Plan:   Problem List Items Addressed This Visit      Cardiovascular and Mediastinum   Hypertension    Well controlled today. No CP. Not orthostatic ( see flow sheet). Not dizzy today in office and when supine. Advised to let us know if recurs.         Musculoskeletal and Integument   Skin sore    Never had dermatology consult. Replaced today. Appt made for this month.         Other   Abdominal pain - Primary   Relevant Orders   CT ABDOMEN PELVIS W CONTRAST (Completed)   CBC with Differential/Platelet   Comprehensive metabolic panel   Lipase   Right flank pain    Etiology nonspecific at this time.  Patient is well-appearing in the exam room. He does exhibit some mild tenderness over the right upper quadrant.  History of cholecystectomy.  Also reviewed chart and noted that he was in the emergency room a couple times earlier this year for similar presentation, with no apparent etiology at discharge.    I have asked nurse to speak with patient  about colonoscopy, EGD which he appears due for both.  Differential at this point is nonspecific however considering gastritis, musculoskeletal etiology. He is on protonix.  At this time, I think the most reasonable step is a consult with GI for colonoscopy, EGD.  Advised patient let us know of any new or worsening symptoms.  Pending labs at this time.       Other Visit Diagnoses    Skin lesions       Relevant Orders   Ambulatory referral to Dermatology       I am having Nicholas Reyes maintain his budesonide-formoterol, atorvastatin, albuterol, metoprolol tartrate, aspirin, clopidogrel, nitroGLYCERIN, triamcinolone cream, mupirocin ointment, cetirizine, amLODipine, furosemide, pantoprazole, montelukast, spironolactone, Secukinumab (COSENTYX 300 DOSE [place]), ranitidine, metFORMIN, hydrOXYzine,  diclofenac, ketorolac, prednisoLONE acetate, doxycycline, Cholecalciferol, ondansetron, meloxicam, DULoxetine, and isosorbide mononitrate.   No orders of the defined types were placed in this  encounter.   Return precautions given.   Risks, benefits, and alternatives of the medications and treatment plan prescribed today were discussed, and patient expressed understanding.   Education regarding symptom management and diagnosis given to patient on AVS.  Continue to follow with McLean-Scocuzza, Nino Glow, MD for routine health maintenance.   Nicholas Reyes and I agreed with plan.   Mable Paris, FNP

## 2018-02-13 NOTE — Progress Notes (Signed)
Yes he is scheduled at [facility] on 10/25. I gave him the appointment and the address. Thanks! Melissa

## 2018-02-13 NOTE — Assessment & Plan Note (Addendum)
Never had dermatology consult. Replaced today. Appt made for this month.

## 2018-02-14 ENCOUNTER — Other Ambulatory Visit: Payer: Self-pay

## 2018-02-14 DIAGNOSIS — K219 Gastro-esophageal reflux disease without esophagitis: Secondary | ICD-10-CM

## 2018-02-14 NOTE — Telephone Encounter (Signed)
Patient given lab results , needs script called in for protonix , looks like we have never filled before

## 2018-02-14 NOTE — Telephone Encounter (Signed)
Unable to leave message for patient to return call back due to [facility] being full. [facility] may give information.  Paperwork is ready for pick up here at the office

## 2018-02-18 MED ORDER — PANTOPRAZOLE SODIUM 20 MG PO TBEC: 20.0000 mg | DELAYED_RELEASE_TABLET | Freq: Every day | ORAL | 0 refills | Status: DC

## 2018-02-18 NOTE — Telephone Encounter (Signed)
Melissa,   Do you know when patient may have GI and urology appt?   Kristen,  tu Call pt  I sent in 3 month supply of protonix 20mg  qd Does he find this mediation helps with his flank pain?  I will not refill any further - this needs to come from GI or his pcp.   Does he have an appt with GI yet?  I also sent in referral for urology- does he have an appt?

## 2018-02-19 NOTE — Telephone Encounter (Signed)
Gastro-11/14 Urology 11/12  Thx! Lenna Sciara

## 2018-02-19 NOTE — Telephone Encounter (Signed)
Left voice mail for patient to call back ok for [facility] to speak to patient   Joycelyn Schmid ,NP sent in protonix 20mg   3 month supply , no refills will need to get from GI or PCP does this help flank pain?

## 2018-02-19 NOTE — Telephone Encounter (Signed)
Pt aware Rx sent to [facility] and to get from his GI dr next time.

## 2018-02-24 ENCOUNTER — Emergency Department: Payer: Medicare HMO

## 2018-02-24 ENCOUNTER — Encounter: Payer: Self-pay | Admitting: Emergency Medicine

## 2018-02-24 ENCOUNTER — Emergency Department
Admission: EM | Admit: 2018-02-24 | Discharge: 2018-02-24 | Disposition: A | Discharge status: Home / Self Care | Payer: Medicare HMO | Attending: Student in an Organized Health Care Education/Training Program | Admitting: Student in an Organized Health Care Education/Training Program

## 2018-02-24 DIAGNOSIS — E119 Type 2 diabetes mellitus without complications: Secondary | ICD-10-CM | POA: Insufficient documentation

## 2018-02-24 DIAGNOSIS — I10 Essential (primary) hypertension: Secondary | ICD-10-CM | POA: Diagnosis not present

## 2018-02-24 DIAGNOSIS — I2510 Atherosclerotic heart disease of native coronary artery without angina pectoris: Secondary | ICD-10-CM | POA: Diagnosis not present

## 2018-02-24 DIAGNOSIS — Z7982 Long term (current) use of aspirin: Secondary | ICD-10-CM | POA: Insufficient documentation

## 2018-02-24 DIAGNOSIS — Z955 Presence of coronary angioplasty implant and graft: Secondary | ICD-10-CM | POA: Diagnosis not present

## 2018-02-24 DIAGNOSIS — Z87891 Personal history of nicotine dependence: Secondary | ICD-10-CM | POA: Insufficient documentation

## 2018-02-24 DIAGNOSIS — Z7902 Long term (current) use of antithrombotics/antiplatelets: Secondary | ICD-10-CM | POA: Diagnosis not present

## 2018-02-24 DIAGNOSIS — R1013 Epigastric pain: Secondary | ICD-10-CM | POA: Insufficient documentation

## 2018-02-24 DIAGNOSIS — K5730 Diverticulosis of large intestine without perforation or abscess without bleeding: Secondary | ICD-10-CM | POA: Diagnosis not present

## 2018-02-24 DIAGNOSIS — J449 Chronic obstructive pulmonary disease, unspecified: Secondary | ICD-10-CM | POA: Insufficient documentation

## 2018-02-24 DIAGNOSIS — I252 Old myocardial infarction: Secondary | ICD-10-CM | POA: Insufficient documentation

## 2018-02-24 DIAGNOSIS — Z7984 Long term (current) use of oral hypoglycemic drugs: Secondary | ICD-10-CM | POA: Insufficient documentation

## 2018-02-24 HISTORY — DX: Acute myocardial infarction, unspecified: I21.9

## 2018-02-24 LAB — CBC
HEMATOCRIT: 44.4 % (ref 39.0–52.0)
Hemoglobin: 13.5 g/dL (ref 13.0–17.0)
MCH: 29.6 pg (ref 26.0–34.0)
MCHC: 30.4 g/dL (ref 30.0–36.0)
MCV: 97.4 fL (ref 80.0–100.0)
NRBC: 0 % (ref 0.0–0.2)
Platelets: 144 10*3/uL — ABNORMAL LOW (ref 150–400)
RBC: 4.56 MIL/uL (ref 4.22–5.81)
RDW: 14.6 % (ref 11.5–15.5)
WBC: 6.6 10*3/uL (ref 4.0–10.5)

## 2018-02-24 LAB — COMPREHENSIVE METABOLIC PANEL
ALT: 13 U/L (ref 0–44)
AST: 16 U/L (ref 15–41)
Albumin: 4.1 g/dL (ref 3.5–5.0)
Alkaline Phosphatase: 60 U/L (ref 38–126)
Anion gap: 6 (ref 5–15)
BUN: 19 mg/dL (ref 8–23)
CHLORIDE: 100 mmol/L (ref 98–111)
CO2: 34 mmol/L — AB (ref 22–32)
CREATININE: 0.84 mg/dL (ref 0.61–1.24)
Calcium: 8.9 mg/dL (ref 8.9–10.3)
Glucose, Bld: 131 mg/dL — ABNORMAL HIGH (ref 70–99)
Potassium: 3.8 mmol/L (ref 3.5–5.1)
SODIUM: 140 mmol/L (ref 135–145)
Total Bilirubin: 0.5 mg/dL (ref 0.3–1.2)
Total Protein: 7.5 g/dL (ref 6.5–8.1)

## 2018-02-24 LAB — URINALYSIS, COMPLETE (UACMP) WITH MICROSCOPIC
Bacteria, UA: NONE SEEN
Glucose, UA: NEGATIVE mg/dL
HGB URINE DIPSTICK: NEGATIVE
Ketones, ur: 5 mg/dL — AB
LEUKOCYTES UA: NEGATIVE
Nitrite: NEGATIVE
PROTEIN: 30 mg/dL — AB
SQUAMOUS EPITHELIAL / LPF: NONE SEEN (ref 0–5)
Specific Gravity, Urine: 1.041 — ABNORMAL HIGH (ref 1.005–1.030)
WBC UA: NONE SEEN WBC/hpf (ref 0–5)
pH: 5 (ref 5.0–8.0)

## 2018-02-24 LAB — LIPASE, BLOOD: LIPASE: 32 U/L (ref 11–51)

## 2018-02-24 MED ORDER — SUCRALFATE 1 G PO TABS: 1.0000 g | ORAL_TABLET | Freq: Three times a day (TID) | ORAL | 0 refills | Status: AC

## 2018-02-24 MED ORDER — SUCRALFATE 1 G PO TABS
1.0000 g | ORAL_TABLET | Freq: Once | ORAL | Status: AC
  Administered 2018-02-24: 1 g via ORAL
  Filled 2018-02-24: qty 1

## 2018-02-24 MED ORDER — IOPAMIDOL (ISOVUE-300) INJECTION 61%
100.0000 mL | Freq: Once | INTRAVENOUS | Status: AC | PRN
  Administered 2018-02-24: 100 mL via INTRAVENOUS

## 2018-02-24 MED ORDER — KETOROLAC TROMETHAMINE 30 MG/ML IJ SOLN
15.0000 mg | Freq: Once | INTRAMUSCULAR | Status: AC
  Administered 2018-02-24: 15 mg via INTRAVENOUS
  Filled 2018-02-24: qty 1

## 2018-02-24 MED ORDER — PROMETHAZINE HCL 25 MG/ML IJ SOLN
12.5000 mg | Freq: Four times a day (QID) | INTRAMUSCULAR | Status: DC | PRN
  Administered 2018-02-24: 12.5 mg via INTRAVENOUS
  Filled 2018-02-24: qty 1

## 2018-02-24 MED ORDER — SUCRALFATE 1 G PO TABS: 1.0000 g | ORAL_TABLET | Freq: Three times a day (TID) | ORAL | 0 refills | Status: DC

## 2018-02-24 MED ORDER — SODIUM CHLORIDE 0.9 % IV BOLUS
1000.0000 mL | Freq: Once | INTRAVENOUS | Status: AC
  Administered 2018-02-24: 1000 mL via INTRAVENOUS

## 2018-02-24 MED ORDER — MORPHINE SULFATE (PF) 4 MG/ML IV SOLN
4.0000 mg | INTRAVENOUS | Status: DC | PRN
  Administered 2018-02-24: 4 mg via INTRAVENOUS
  Filled 2018-02-24: qty 1

## 2018-02-24 NOTE — ED Notes (Signed)
Patient to stat desk in no acute distress. Patient given update on wait time. Patient verbalizes understanding.

## 2018-02-24 NOTE — ED Provider Notes (Signed)
[facility] Emergency Department Provider Note    First MD Initiated Contact with Patient 02/24/18 [phone]     (approximate)  I have reviewed the triage vital signs and the nursing notes.   HISTORY  Chief Complaint Abdominal Pain    HPI Nicholas Reyes is a 62 y.o. male presents the ER for evaluation of epigastric pain nausea decreased oral intake and abdominal pain.  This is primarily on the right side.  Denies any fevers.  No chest pain or shortness of breath.  He is status post cholecystectomy and appendectomy.  Denies any new medications.  States the pain is mild to moderate and constant in nature.  No diarrhea.  No blood in his stools.    Past Medical History:  Diagnosis Date  . Arthritis   . CAD (coronary artery disease)   . COPD (chronic obstructive pulmonary disease) ([place])   . Depression   . Diabetes mellitus   . Heart disease    cad with 3 stents   . Hypertension   . MI (myocardial infarction) ([place])   . Migraines   . Psoriasis    joint pain in knees left hand    Family History  Problem Relation Age of Onset  . Cancer Mother   . Heart disease Father   . Stroke Father    Past Surgical History:  Procedure Laterality Date  . APPENDECTOMY    . CATARACT EXTRACTION Bilateral    inserted lens b/l eyes right 03/12/17, left 05/28/17   . CHOLECYSTECTOMY    . CORONARY ANGIOPLASTY WITH STENT PLACEMENT     LAD, LCX-OM, ? 3rd location  . EYE SURGERY     cataract b/l   . HEMORRHOID SURGERY    . hemorrhoid surgery      x 2   . KNEE SURGERY     right knee arthroscopy, partial synovectecomy 04/21/15 and meniscectomy/chondroplasty   . LEFT HEART CATH AND CORONARY ANGIOGRAPHY Left 01/30/2018   Procedure: LEFT HEART CATH AND CORONARY ANGIOGRAPHY;  Surgeon: Isaias Cowman, MD;  Location: [facility];  Service: Cardiovascular;  Laterality: Left;  . right knee surgery     ? type of procedure   . STENT PLACEMENT VASCULAR ([place] HX)    .  TONSILLECTOMY    . TONSILLECTOMY AND ADENOIDECTOMY     Patient Active Problem List   Diagnosis Date Noted  . Right flank pain 02/13/2018  . Osteoarthritis of knee (Bilateral) (R>L) 01/28/2018  . Tricompartment osteoarthritis of knee (Right) 01/28/2018  . Osteoarthritis of hands (Bilateral) 01/28/2018  . Chronic anticoagulation (PLAVIX) 01/28/2018  . Chronic knee pain (Primary Area of Pain) (Bilateral) (R>L) 01/02/2018  . Chronic hand pain (Secondary Area of Pain) (Bilateral) (L>R) 01/02/2018  . Chronic pain syndrome 01/02/2018  . Long term current use of opiate analgesic 01/02/2018  . Pharmacologic therapy 01/02/2018  . Disorder of skeletal system 01/02/2018  . Problems influencing health status 01/02/2018  . Cognitive impairment 12/11/2017  . DM2 (diabetes mellitus, type 2) ([place]) 12/04/2017  . Psoriasis 11/15/2017  . Skin sore 11/15/2017  . Diabetes mellitus without complication ([place]) 54/56/2563  . Vitamin D deficiency 11/15/2017  . Essential hypertension 06/11/2017  . Coronary artery disease involving native coronary artery of native heart without angina pectoris 06/11/2017  . Exercise-induced angina ([place]) 01/31/2016  . Hypertension 05/18/2014  . Edema 02/09/2014  . Mixed hyperlipidemia 04/02/2013  . HTN (hypertension) 04/02/2013  . Chest pain with high risk for cardiac etiology 12/12/2012  .  Dyspnea 12/12/2012  . Infective otitis externa 09/25/2012  . Coronary atherosclerosis 09/02/2012  . Otalgia 09/02/2012  . Benign essential hypertension 08/02/2012  . Chronic airway obstruction, not elsewhere classified 08/02/2012  . Depressive disorder, not elsewhere classified 08/02/2012  . Family history of ischemic heart disease (IHD) 07/08/2012  . Family history of ischemic heart disease and other diseases of the circulatory system 07/08/2012  . Allergic rhinitis 07/05/2012  . Other psoriasis 07/05/2012  . Benign neoplasm of colon 09/22/2010  . Arthritis 06/09/2010  . Asthma  06/09/2010  . Diarrhea 06/09/2010  . Headaches due to old head injury 06/09/2010  . Type 2 diabetes mellitus without complications ([place]) 37/62/8315  . Abdominal pain 06/09/2010      Prior to Admission medications   Medication Sig Start Date End Date Taking? Authorizing Provider  albuterol (PROVENTIL HFA;VENTOLIN HFA) 108 (90 BASE) MCG/ACT inhaler Inhale 2 puffs into the lungs every 4 (four) hours as needed. For shortness of breath    [provider]  amLODipine (NORVASC) 5 MG tablet Take 5 mg by mouth daily.    [provider]  aspirin 81 MG EC tablet Take 81 mg by mouth daily.     [provider]  atorvastatin (LIPITOR) 20 MG tablet Take 20 mg by mouth daily.    [provider]  budesonide-formoterol (SYMBICORT) 80-4.5 MCG/ACT inhaler Inhale 2 puffs into the lungs 2 (two) times daily.    [provider]  cetirizine (ZYRTEC) 10 MG tablet Take 10 mg by mouth daily.    [provider]  Cholecalciferol 50000 units capsule Take 1 capsule (50,000 Units total) by mouth once a week. 12/10/17   McLean-Scocuzza, Nino Glow, MD  clopidogrel (PLAVIX) 75 MG tablet Take 75 mg by mouth daily.    [provider]  diclofenac (FLECTOR) 1.3 % PTCH Place 1 patch onto the skin 2 (two) times daily. 11/30/17   McLean-Scocuzza, Nino Glow, MD  doxycycline (VIBRA-TABS) 100 MG tablet Take 1 tablet (100 mg total) by mouth 2 (two) times daily. With food 12/04/17   McLean-Scocuzza, Nino Glow, MD  DULoxetine (CYMBALTA) 60 MG capsule Take 1 capsule (60 mg total) by mouth daily. 01/11/18   McLean-Scocuzza, Nino Glow, MD  furosemide (LASIX) 40 MG tablet Take 40 mg by mouth daily.    [provider]  hydrOXYzine (ATARAX/VISTARIL) 25 MG tablet Take 1 tablet (25 mg total) by mouth 3 (three) times daily as needed. 11/28/17   Triplett, Johnette Abraham B, FNP  isosorbide mononitrate (IMDUR) 30 MG 24 hr tablet Take 1 tablet (30 mg total) by mouth daily. 01/11/18   McLean-Scocuzza, Nino Glow,  MD  ketorolac (ACULAR) 0.5 % ophthalmic solution  12/03/17   [provider]  meloxicam (MOBIC) 7.5 MG tablet Take 1 tablet (7.5 mg total) by mouth 2 (two) times daily as needed for pain. 12/31/17   McLean-Scocuzza, Nino Glow, MD  metFORMIN (GLUCOPHAGE) 500 MG tablet Take 1 tablet (500 mg total) by mouth daily with breakfast. 11/23/17   McLean-Scocuzza, Nino Glow, MD  metoprolol tartrate (LOPRESSOR) 25 MG tablet Take 25 mg by mouth 2 (two) times daily.    [provider]  montelukast (SINGULAIR) 10 MG tablet Take 10 mg by mouth at bedtime.    [provider]  mupirocin ointment (BACTROBAN) 2 % Apply to affected area 3 times daily 11/18/17 11/18/18  Sable Feil, PA-C  nitroGLYCERIN (NITROSTAT) 0.4 MG SL tablet Place 0.4 mg under the tongue every 5 (five) minutes as needed. For chest  pain    [provider]  ondansetron (ZOFRAN) 4 MG tablet Take 1 tablet (4 mg total) by mouth every 8 (eight) hours as needed for nausea or vomiting. 12/19/17   Guse, Jacquelynn Cree, FNP  pantoprazole (PROTONIX) 20 MG tablet Take 1 tablet (20 mg total) by mouth daily. 02/18/18   Burnard Hawthorne, FNP  prednisoLONE acetate (PRED FORTE) 1 % ophthalmic suspension  11/30/17   [provider]  ranitidine (ZANTAC) 150 MG/10ML syrup 150 mg 2 (two) times daily. Taking 10 ml bid    [provider]  Secukinumab (COSENTYX 300 DOSE [place]) Inject into the skin every 30 (thirty) days.    [provider]  spironolactone (ALDACTONE) 25 MG tablet Take 25 mg by mouth daily.    [provider]  sucralfate (CARAFATE) 1 g tablet Take 1 tablet (1 g total) by mouth 4 (four) times daily -  with meals and at bedtime. 02/24/18 02/24/19  Merlyn Lot, MD  triamcinolone cream (KENALOG) 0.1 % Apply 1 application topically 2 (two) times daily. Not face, groin, underarms 11/15/17   McLean-Scocuzza, Nino Glow, MD    Allergies Patient has no known allergies.    Social History Social  History   Tobacco Use  . Smoking status: Former Research scientist (life sciences)  . Smokeless tobacco: Never Used  Substance Use Topics  . Alcohol use: No    Frequency: Never  . Drug use: No    Review of Systems Patient denies headaches, rhinorrhea, blurry vision, numbness, shortness of breath, chest pain, edema, cough, abdominal pain, nausea, vomiting, diarrhea, dysuria, fevers, rashes or hallucinations unless otherwise stated above in HPI. ____________________________________________   PHYSICAL EXAM:  VITAL SIGNS: Vitals:   02/24/18 0228  BP: (!) 158/71  Pulse: 86  Resp: 18  Temp: 97.9 F (36.6 C)  SpO2: 94%    Constitutional: Alert and oriented.  Eyes: Conjunctivae are normal.  Head: Atraumatic. Nose: No congestion/rhinnorhea. Mouth/Throat: Mucous membranes are moist.   Neck: No stridor. Painless ROM.  Cardiovascular: Normal rate, regular rhythm. Grossly normal heart sounds.  Good peripheral circulation. Respiratory: Normal respiratory effort.  No retractions. Lungs CTAB. Gastrointestinal: Soft with mild right upper quadrant and epigastric abdominal pain.. No distention. No abdominal bruits. No CVA tenderness. Genitourinary:  Musculoskeletal: No lower extremity tenderness nor edema.  No joint effusions. Neurologic:  Normal speech and language. No gross focal neurologic deficits are appreciated. No facial droop Skin:  Skin is warm, dry and intact. No rash noted. Psychiatric: Mood and affect are normal. Speech and behavior are normal.  ____________________________________________   LABS (all labs ordered are listed, but only abnormal results are displayed)  Results for orders placed or performed during the hospital encounter of 02/24/18 (from the past 24 hour(s))  Lipase, blood     Status: None   Collection Time: 02/24/18  2:33 AM  Result Value Ref Range   Lipase 32 11 - 51 U/L  Comprehensive metabolic panel     Status: Abnormal   Collection Time: 02/24/18  2:33 AM  Result Value Ref  Range   Sodium 140 135 - 145 mmol/L   Potassium 3.8 3.5 - 5.1 mmol/L   Chloride 100 98 - 111 mmol/L   CO2 34 (H) 22 - 32 mmol/L   Glucose, Bld 131 (H) 70 - 99 mg/dL   BUN 19 8 - 23 mg/dL   Creatinine, Ser 0.84 0.61 - 1.24 mg/dL   Calcium 8.9 8.9 - 10.3 mg/dL   Total Protein 7.5 6.5 - 8.1 g/dL  Albumin 4.1 3.5 - 5.0 g/dL   AST 16 15 - 41 U/L   ALT 13 0 - 44 U/L   Alkaline Phosphatase 60 38 - 126 U/L   Total Bilirubin 0.5 0.3 - 1.2 mg/dL   GFR calc non Af Amer >60 >60 mL/min   GFR calc Af Amer >60 >60 mL/min   Anion gap 6 5 - 15  CBC     Status: Abnormal   Collection Time: 02/24/18  2:33 AM  Result Value Ref Range   WBC 6.6 4.0 - 10.5 K/uL   RBC 4.56 4.22 - 5.81 MIL/uL   Hemoglobin 13.5 13.0 - 17.0 g/dL   HCT 44.4 39.0 - 52.0 %   MCV 97.4 80.0 - 100.0 fL   MCH 29.6 26.0 - 34.0 pg   MCHC 30.4 30.0 - 36.0 g/dL   RDW 14.6 11.5 - 15.5 %   Platelets 144 (L) 150 - 400 K/uL   nRBC 0.0 0.0 - 0.2 %  Urinalysis, Complete w Microscopic     Status: Abnormal   Collection Time: 02/24/18  2:33 AM  Result Value Ref Range   Color, Urine AMBER (A) YELLOW   APPearance CLOUDY (A) CLEAR   Specific Gravity, Urine 1.041 (H) 1.005 - 1.030   pH 5.0 5.0 - 8.0   Glucose, UA NEGATIVE NEGATIVE mg/dL   Hgb urine dipstick NEGATIVE NEGATIVE   Bilirubin Urine SMALL (A) NEGATIVE   Ketones, ur 5 (A) NEGATIVE mg/dL   Protein, ur 30 (A) NEGATIVE mg/dL   Nitrite NEGATIVE NEGATIVE   Leukocytes, UA NEGATIVE NEGATIVE   WBC, UA NONE SEEN 0 - 5 WBC/hpf   Bacteria, UA NONE SEEN NONE SEEN   Squamous Epithelial / LPF NONE SEEN 0 - 5   Mucus PRESENT    Hyaline Casts, UA PRESENT    Ca Oxalate Crys, UA PRESENT    ____________________________________________ ____________________________________________  RADIOLOGY  I personally reviewed all radiographic images ordered to evaluate for the above acute complaints and reviewed radiology reports and findings.  These findings were personally discussed with the  patient.  Please see medical record for radiology report.  ____________________________________________   PROCEDURES  Procedure(s) performed:  Procedures    Critical Care performed: no ____________________________________________   INITIAL IMPRESSION / ASSESSMENT AND PLAN / ED COURSE  Pertinent labs & imaging results that were available during my care of the patient were reviewed by me and considered in my medical decision making (see chart for details).   DDX: pancreatitis, gastritis, enteritis, diverticulitis, shingles, pyelo, uti, msk strain  Nicholas Reyes is a 62 y.o. who presents to the ED with symptoms as described above.  Patient afebrile Heema dynamically stable.  Does have some tenderness to palpation epigastric region.  Given his history history of diet reticulosis.  Repeat CT imaging will be ordered to exclude diverticulitis, perforation, abscess.  Urine does appear significantly contracted.  No evidence of infection.  CT imaging does not show any evidence of acute abnormality.  Pain resolved after IV pain medication and IV fluids.  Possible nonspecific enteritis.  Will p.o. challenge.  Clinical Course as of Feb 24 913  Sun Feb 24, 2018  3532 Patient tolerating oral hydration.  Pain resolved.  At this point patient stable and appropriate for outpatient follow-up.   [PR]    Clinical Course User Index [PR] Merlyn Lot, MD     As part of my medical decision making, I reviewed the following data within the [place] notes  reviewed and incorporated, Labs reviewed, notes from prior ED visits and [place] Controlled Substance Database   ____________________________________________   FINAL CLINICAL IMPRESSION(S) / ED DIAGNOSES  Final diagnoses:  Epigastric pain      NEW MEDICATIONS STARTED DURING THIS VISIT:  Current Discharge Medication List    START taking these medications   Details  sucralfate (CARAFATE) 1 g tablet Take 1 tablet  (1 g total) by mouth 4 (four) times daily -  with meals and at bedtime. Qty: 120 tablet, Refills: 0         Note:  This document was prepared using Dragon voice recognition software and may include unintentional dictation errors.    Merlyn Lot, MD 02/24/18 [phone]

## 2018-02-24 NOTE — ED Notes (Signed)
Patient

## 2018-02-24 NOTE — Discharge Instructions (Addendum)

## 2018-02-24 NOTE — ED Notes (Signed)
Pt waiting in lobby until 1115 per charge nurse. Pt steady gait, clear speech. Pt verbalizes understanding of wait time post narcotic admin.

## 2018-02-24 NOTE — ED Notes (Signed)
Patient ambulatory to stat desk in no acute distress. Patient given update on wait time. Patient verbalizes understanding.  

## 2018-02-24 NOTE — ED Notes (Signed)
Pt sister unable to provide ride. Pt informed he must wait 4 hours from narcotic admin. Pt agrees. Pt asks "what can you cook me for breakfast?" Explained nursing staff does not cook and provided crackers and peanut butter and will order tray.

## 2018-02-24 NOTE — ED Triage Notes (Signed)
Patient with complaint of generalized abdominal pain times 2-3 days. Patient denies nausea, vomiting, diarrhea and urinary symptoms.

## 2018-02-25 ENCOUNTER — Encounter: Payer: Self-pay | Admitting: *Deleted

## 2018-02-25 ENCOUNTER — Other Ambulatory Visit: Payer: Self-pay | Admitting: *Deleted

## 2018-02-25 NOTE — Addendum Note (Signed)
Addended by: Hubert Azure D on: 02/25/2018 03:01 PM   Modules accepted: Orders

## 2018-02-25 NOTE — Patient Outreach (Signed)
[facility]) Care Management  02/25/2018  Nicholas Reyes May 26, 1955 [id number]   Telephone Screen  Referral Date:  02/25/2018 Referral Source:  [facility] ED Census Reason for Referral:  6 or more ED visits in the past 6 months Insurance:  [employer] & Medicaid   Outreach Attempt:  Outreach attempt #1 to patient for telephone screening. No answer and unable to leave voicemail message due to voicemail box being full.  Plan:  RN Health Coach will send unsuccessful outreach letter to patient.  RN Health Coach will make another outreach attempt to patient within 3-4 business days if no return call back from patient.   [place] [phone] [email or web address]

## 2018-02-25 NOTE — Patient Outreach (Signed)
[facility] [facility]) Care Management  02/25/2018  Nicholas Reyes [birth year] [id number]   Telephone Screen  Referral Date:  02/25/2018 Referral Source:  [facility] ED Census Reason for Referral:  6 or more ED visits in the past 6 months Insurance:  [employer] & Medicaid   Outreach Attempt:  Received telephone call back from patient.  HIPAA verified with patient.  Patient completed initial telephone assessment.  Social:  Patient lives at home alone.  Reports being independent with his ADLs and IADLs.  Ambulates independently and denies any falls in the last year.  Drives himself to his medical appointments.  DME in the home include:  Blood pressure cuff, CBG meter, and CPAP.  Conditions:  Per chart review and discussion with patient, PMH include but not limited to:  Arthritis, coronary artery disease, COPD, depression, diabetes, hypertension, myocardial infarction, migraines, psoriasis, bilateral cataracts removed, and cholecystectomy.  Patient reporting his recent emergency room visits have been related to abdominal or side pain.  States pain is only relieved by IV medication he receives in the emergency room.  Patient reporting he has seen primary care and they have not determined cause of pain nor have they given him medication for treatment.  Encouraged patient to contact primary care provider for continued abdominal pain.  States he monitors his blood sugars once a day.  Has not monitored yet today.  Patient unable to state most recent Hgb A1C, it is documented as 6.6 on 11/15/2017.  Reviewed results with patient and explained what Hgb A1C means.  States he monitors his blood pressures almost daily with ranges of 150/70's.  Patient reporting pain in his knees and requesting assistance with initiating Meals on Wheels.  Discussed [place] [employer] consult and patient verbally agrees.  Medications:  Reports taking about 10 medications.  States he manages his medications himself without  difficulties.  Denies any issues affording medications at this time.  Appointments:  Patient saw primary care provider on 02/13/2018 and has scheduled follow up with Dr. Terese Door on 02/27/2018.  Advanced Directives:  Denies having an Advance Directive in place and does not wish to create one at this time.   Consent:  [facility] services reviewed and discussed.  Patient verbally agrees to Disease Management telephone outreaches and [place] [employer] referral for Meals on Wheels  Plan: [place] will send [facility] SW referral for possible assistance with community resources related to initiation of Meals on Wheels. RN Health Coach will make next outreach to complete initial telephone assessment within the month of November.  [place] [phone] [email or web address]

## 2018-02-27 ENCOUNTER — Other Ambulatory Visit: Payer: Self-pay | Admitting: *Deleted

## 2018-02-27 ENCOUNTER — Ambulatory Visit: Payer: Medicare HMO | Admitting: Internal Medicine

## 2018-02-27 DIAGNOSIS — Z0289 Encounter for other administrative examinations: Secondary | ICD-10-CM

## 2018-02-27 NOTE — Patient Outreach (Addendum)
[place] [facility]) Care Management  02/27/2018  Nicholas Reyes 11-25-1955 [id number]   Initial call attempt  Patient referred to this social worker to  assist with referral for Meals on Wheels. Patient lives alone and is independent with his ADL's and  IADL's. Per patient, he has a car and transports himself to medical appointments and to area food banks (allied churches) for meals.Patient also receives food stamps.  Patient is not home bound, however has multiple medical conditions that my impact his ability to obtain meals. Patient encouraged to continue to utilize the local food banks for meals.  This Education officer, museum contacted [place] at [employer]. Referral completed. Patient placed on the waiting list and will be contacted once a space opens up on their route for additional information to confirm patient's eligibility. Patient verbalized having no additional  [employer] needs. This Education officer, museum to sign off at this time.   [name] [facility] Care Management [phone]

## 2018-02-27 NOTE — Patient Outreach (Signed)
[place] [facility]) Care Management  02/27/2018  Nicholas Reyes August 23, 1955 [id number]   This social worker contacted [name] at [employer] on [employer]. Referral completed. Patient placed on the waiting list and will be contacted once a space opens up on their route for additional information to confirm patient's eligibility.  Patient informed of referral   [name] [facility] Care Management [phone]

## 2018-02-28 ENCOUNTER — Ambulatory Visit: Payer: Medicare HMO | Admitting: *Deleted

## 2018-03-10 NOTE — Progress Notes (Unsigned)
No show

## 2018-03-11 ENCOUNTER — Encounter

## 2018-03-11 ENCOUNTER — Encounter: Payer: Self-pay | Admitting: Cardiovascular Disease

## 2018-03-11 ENCOUNTER — Ambulatory Visit: Payer: Medicare HMO | Admitting: Cardiovascular Disease

## 2018-03-12 ENCOUNTER — Other Ambulatory Visit: Payer: Self-pay | Admitting: *Deleted

## 2018-03-12 ENCOUNTER — Encounter: Payer: Self-pay | Admitting: *Deleted

## 2018-03-12 ENCOUNTER — Ambulatory Visit: Payer: Medicare HMO

## 2018-03-12 NOTE — Patient Outreach (Signed)
[place] [facility]) Care Management  03/12/2018  ELIN SEATS 1955/09/01 [id number]   [place] Initial Assessment  Referral Date:02/25/2018 Referral Source:[facility] ED Census Reason for Referral:6 or more ED visits in the past 6 months Insurance:[employer] & Medicaid   Outreach Attempt:  Outreach attempt #1 to patient for initial telephone assessment. No answer. RN Health Coach left HIPAA compliant voicemail message along with contact information.  Plan:  RN Health Coach will make another outreach attempt within the month of November.   [place] [phone] [email or web address] [email or web address]

## 2018-03-12 NOTE — Patient Outreach (Signed)
[facility]) Care Management  [place]  03/12/2018   Nicholas Reyes 1955/05/11 [id number]   [place] Initial Assessment   Referral Date:  02/25/2018 Referral Source:  [facility] ED Census Reason for Referral:  6 or more ED visits in the past 6 months Insurance:  [employer] & Medicaid   Outreach Attempt:  Received telephone call back from patient.  HIPAA verified with patient.  Initial telephone assessment initiated and towards end of conversation, patient state "I am tired and need to hang up"; call ended.  Social:  Patient lives at home alone.  Reports being independent with his ADLs and IADLs.  Ambulates independently and denies any falls in the last year.  Drives himself to his medical appointments.  Reporting he was without a vehicle a few weeks ago but now has a running car.  DME in the home include:  Blood pressure cuff, CBG meter, and CPAP.  Conditions:  Per chart review and discussion with patient, PMH include but not limited to:  Arthritis, coronary artery disease, COPD, depression, diabetes, hypertension, myocardial infarction, migraines, psoriasis, bilateral cataracts removed, and cholecystectomy.  Patient reporting his recent emergency room visits have been related to abdominal or side pain.  States pain is only relieved by IV medication he receives in the emergency room.  Patient reporting he has previously seen his primary care and they have not determined cause of pain nor have they given him medication for treatment. Encouraged patient to contact primary care provider for continued abdominal pain. States he monitors his blood sugars twice a day.  Has not monitored yet today.  Patient unable to state most recent Hgb A1C, it is documented as 6.6 on 11/15/2017.  Reviewed results with patient and explained what Hgb A1C means.  States he monitors his blood pressures almost daily.  Patient reporting pain in his knees and managing pain without medication.   Encouraged patient to discuss pain with his physicians.  Discussed patient on waiting list for Meals on Wheels per [facility] Social Worker notes.  Medications:  Reports taking about 10 medications.  States he manages his medications himself without difficulties.  Denies any issues affording medications at this time.  Encounter Medications:  Outpatient Encounter Medications as of 03/12/2018  Medication Sig  . albuterol (PROVENTIL HFA;VENTOLIN HFA) 108 (90 BASE) MCG/ACT inhaler Inhale 2 puffs into the lungs every 4 (four) hours as needed. For shortness of breath  . amLODipine (NORVASC) 5 MG tablet Take 5 mg by mouth daily.  Marland Kitchen aspirin 81 MG EC tablet Take 81 mg by mouth daily.   Marland Kitchen atorvastatin (LIPITOR) 20 MG tablet Take 20 mg by mouth daily.  . budesonide-formoterol (SYMBICORT) 80-4.5 MCG/ACT inhaler Inhale 2 puffs into the lungs 2 (two) times daily.  . cetirizine (ZYRTEC) 10 MG tablet Take 10 mg by mouth daily.  . Cholecalciferol 50000 units capsule Take 1 capsule (50,000 Units total) by mouth once a week.  . clopidogrel (PLAVIX) 75 MG tablet Take 75 mg by mouth daily.  . diclofenac (FLECTOR) 1.3 % PTCH Place 1 patch onto the skin 2 (two) times daily.  . DULoxetine (CYMBALTA) 60 MG capsule Take 1 capsule (60 mg total) by mouth daily.  . furosemide (LASIX) 40 MG tablet Take 40 mg by mouth daily.  . hydrOXYzine (ATARAX/VISTARIL) 25 MG tablet Take 1 tablet (25 mg total) by mouth 3 (three) times daily as needed.  . isosorbide mononitrate (IMDUR) 30 MG 24 hr tablet Take 1 tablet (30 mg  total) by mouth daily.  Marland Kitchen ketorolac (ACULAR) 0.5 % ophthalmic solution   . meloxicam (MOBIC) 7.5 MG tablet Take 1 tablet (7.5 mg total) by mouth 2 (two) times daily as needed for pain.  . metFORMIN (GLUCOPHAGE) 500 MG tablet Take 1 tablet (500 mg total) by mouth daily with breakfast.  . metoprolol tartrate (LOPRESSOR) 25 MG tablet Take 25 mg by mouth 2 (two) times daily.  . montelukast (SINGULAIR) 10 MG tablet Take 10 mg by  mouth at bedtime.  . mupirocin ointment (BACTROBAN) 2 % Apply to affected area 3 times daily  . nitroGLYCERIN (NITROSTAT) 0.4 MG SL tablet Place 0.4 mg under the tongue every 5 (five) minutes as needed. For chest pain  . ondansetron (ZOFRAN) 4 MG tablet Take 1 tablet (4 mg total) by mouth every 8 (eight) hours as needed for nausea or vomiting.  . pantoprazole (PROTONIX) 20 MG tablet Take 1 tablet (20 mg total) by mouth daily.  . prednisoLONE acetate (PRED FORTE) 1 % ophthalmic suspension   . ranitidine (ZANTAC) 150 MG/10ML syrup 150 mg 2 (two) times daily. Taking 10 ml bid  . Secukinumab (COSENTYX 300 DOSE [place]) Inject into the skin every 30 (thirty) days.  Marland Kitchen spironolactone (ALDACTONE) 25 MG tablet Take 25 mg by mouth daily.  . sucralfate (CARAFATE) 1 g tablet Take 1 tablet (1 g total) by mouth 4 (four) times daily -  with meals and at bedtime.  . triamcinolone cream (KENALOG) 0.1 % Apply 1 application topically 2 (two) times daily. Not face, groin, underarms  . doxycycline (VIBRA-TABS) 100 MG tablet Take 1 tablet (100 mg total) by mouth 2 (two) times daily. With food (Patient not taking: Reported on 03/12/2018)   No facility-administered encounter medications on file as of 03/12/2018.     Functional Status:  In your present state of health, do you have any difficulty performing the following activities: 03/12/2018 01/30/2018  Hearing? Y N  Vision? Y N  Difficulty concentrating or making decisions? Y N  Walking or climbing stairs? N N  Dressing or bathing? N N  Doing errands, shopping? N -  Preparing Food and eating ? N -  Using the Toilet? N -  In the past six months, have you accidently leaked urine? Y -  Do you have problems with loss of bowel control? N -  Managing your Medications? N -  Managing your Finances? N -  Housekeeping or managing your Housekeeping? N -  Some recent data might be hidden    Fall/Depression Screening: Fall Risk  03/12/2018 02/25/2018 02/07/2018  Falls in the  past year? 0 No Yes  Number falls in past yr: - - 1  Follow up Falls prevention discussed;Education provided - -   PHQ 2/9 Scores 03/12/2018 02/25/2018 02/07/2018 01/28/2018 01/02/2018 12/04/2017 11/15/2017  PHQ - 2 Score 0 0 0 0 0 0 0    [facility] CM Care Plan Problem One     Most Recent Value  Care Plan Problem One  Knowledge deficiet related to diabetes.  Role Documenting the Problem One  [place] for Problem One  Active  [facility] Long Term Goal   Patient will report no hospitalizations within the next 90 days.  [facility] Long Term Goal Start Date  03/12/18  Interventions for Problem One Long Term Goal  Reviewed and discussed current care plan and goals, reviewed medications and indications and encouraged medication complaince, encoruaged patient to follow up with physicians concerning abdominal pains, sending Calendar Booklet to  assist with documentation of blood sugars and tracking appointments, confirmed patient knows he is on waiting list for meals on wheels placed by [facility] Social Worker  [facility] CM Short Term Goal #1   Patient will reschedule PCP appointment within the next 90 days.  [facility] CM Short Term Goal #1 Start Date  03/12/18  Interventions for Short Term Goal #1  Discussed with patient missed medical appointment on 02/27/2018, encouraged patient to call office to reschedule missed appointment, discussed importance of medical follow up, confirmed patient now has transportation to appointments  [facility] CM Short Term Goal #2   Patient will verbalize his mornings blood sugar at next conversation.  [facility] CM Short Term Goal #2 Start Date  03/12/18  Interventions for Short Term Goal #2  Sending Living Better with Diabetes Booklet, encoruaged patient to monitor blood sugars at least daily, discussed Hgb A1C     Appointments:  Reports last seeing his primary care provider on 02/13/2018.  Discussed with patient he has a no show appointment on 02/27/2018 with Dr. Terese Door.  States he did not remember the  appointment but also his car was not working at the time.  States he has a working vehicle now.  Encouraged patient to call office to reschedule missed appointment.  Advanced Directives:  Denies having an Advance Directive in place and does not wish to create one at this time   Consent:  [facility] services reviewed and discussed.  Patient verbally agrees to Disease Management telephone outreaches and appreciates assistance he received from [place].  Plan: RN Health Coach will send primary MD barriers letter. RN Health Coach will route initial telephone assessment note to primary MD. [place] will send patient [place]. RN Health Coach will send patient 2020 Calendar Booklet. RN Health Coach will send patient Living Well with Diabetes Booklet. RN Health Coach will send patient [place] [facility] Brochure. RN Health Coach will make next telephone outreach to patient in the month of February 2020.  [place] [phone] [email or web address]

## 2018-03-14 ENCOUNTER — Other Ambulatory Visit: Payer: Self-pay

## 2018-03-14 ENCOUNTER — Encounter: Payer: Self-pay | Admitting: Pain Medicine

## 2018-03-14 ENCOUNTER — Ambulatory Visit: Payer: Medicare HMO | Attending: Pain Medicine | Admitting: Pain Medicine

## 2018-03-14 VITALS — BP 142/78 | HR 86 | Temp 97.9°F | Resp 20

## 2018-03-14 DIAGNOSIS — Z7902 Long term (current) use of antithrombotics/antiplatelets: Secondary | ICD-10-CM | POA: Insufficient documentation

## 2018-03-14 DIAGNOSIS — M170 Bilateral primary osteoarthritis of knee: Secondary | ICD-10-CM | POA: Diagnosis not present

## 2018-03-14 DIAGNOSIS — M25561 Pain in right knee: Secondary | ICD-10-CM | POA: Diagnosis not present

## 2018-03-14 DIAGNOSIS — Z955 Presence of coronary angioplasty implant and graft: Secondary | ICD-10-CM | POA: Insufficient documentation

## 2018-03-14 DIAGNOSIS — M25562 Pain in left knee: Secondary | ICD-10-CM | POA: Insufficient documentation

## 2018-03-14 DIAGNOSIS — Z7901 Long term (current) use of anticoagulants: Secondary | ICD-10-CM

## 2018-03-14 DIAGNOSIS — G8929 Other chronic pain: Secondary | ICD-10-CM | POA: Diagnosis not present

## 2018-03-14 DIAGNOSIS — M1711 Unilateral primary osteoarthritis, right knee: Secondary | ICD-10-CM

## 2018-03-14 MED ORDER — SODIUM HYALURONATE (VISCOSUP) 20 MG/2ML IX SOSY: 2.0000 mL | PREFILLED_SYRINGE | Freq: Once | INTRA_ARTICULAR | Status: DC

## 2018-03-14 MED ORDER — LIDOCAINE HCL (PF) 1 % IJ SOLN
4.0000 mL | Freq: Once | INTRAMUSCULAR | Status: AC
  Administered 2018-03-14: 5 mL

## 2018-03-14 MED ORDER — ROPIVACAINE HCL 2 MG/ML IJ SOLN
4.0000 mL | Freq: Once | INTRAMUSCULAR | Status: AC
  Administered 2018-03-14: 10 mL via INTRA_ARTICULAR

## 2018-03-14 MED ORDER — LIDOCAINE HCL (PF) 1 % IJ SOLN
INTRAMUSCULAR | Status: AC
  Filled 2018-03-14: qty 5

## 2018-03-14 MED ORDER — ROPIVACAINE HCL 2 MG/ML IJ SOLN
INTRAMUSCULAR | Status: AC
  Filled 2018-03-14: qty 10

## 2018-03-14 NOTE — Progress Notes (Signed)
Safety precautions to be maintained throughout the outpatient stay will include: orient to surroundings, keep bed in low position, maintain call bell within reach at all times, provide assistance with transfer out of bed and ambulation.  

## 2018-03-14 NOTE — Progress Notes (Signed)
Patient's Name: Nicholas Reyes  MRN: [id number]  Referring Provider: Orland Mustard *  DOB: 1955-09-21  PCP: McLean-Scocuzza, Nino Glow, MD  DOS: 03/14/2018  Note by: Gaspar Cola, MD  Service setting: Ambulatory outpatient  Specialty: Interventional Pain Management  Patient type: Established  Location: [facility] (AMB) Pain Management Facility  Visit type: Interventional Procedure   Primary Reason for Visit: Interventional Pain Management Treatment. CC: No chief complaint on file.  Procedure:          Anesthesia, Analgesia, Anxiolysis:  Type: Therapeutic Intra-Articular Hyalgan Knee Injection #2  Region: Lateral infrapatellar Knee Region Level: Knee Joint Laterality: Bilateral  Type: Local Anesthesia Indication(s): Analgesia         Local Anesthetic: Lidocaine 1-2% Route: Infiltration ([place]/IM) IV Access: Declined Sedation: Declined   Position: Sitting   Indications: 1. Chronic knee pain (Primary Area of Pain) (Bilateral) (R>L)   2. Osteoarthritis of knee (Bilateral) (R>L)   3. Tricompartment osteoarthritis of knee (Right)   4. Chronic anticoagulation (PLAVIX)    Pain Score: Pre-procedure: 5 /10 Post-procedure: 0-No pain/10  Pre-op Assessment:  Nicholas Reyes is a 62 y.o. (year old), male patient, seen today for interventional treatment. He  has a past surgical history that includes Cataract extraction (Bilateral); Cholecystectomy; Appendectomy; Tonsillectomy; STENT PLACEMENT VASCULAR ([place] HX); right knee surgery; Tonsillectomy and adenoidectomy; hemorrhoid surgery ; Eye surgery; Coronary angioplasty with stent; Hemorrhoid surgery; Knee surgery; and LEFT HEART CATH AND CORONARY ANGIOGRAPHY (Left, 01/30/2018). Nicholas Reyes has a current medication list which includes the following prescription(s): albuterol, amlodipine, aspirin, atorvastatin, budesonide-formoterol, cetirizine, cholecalciferol, diclofenac, doxycycline, duloxetine, furosemide, hydroxyzine, isosorbide mononitrate,  ketorolac, meloxicam, metformin, metoprolol tartrate, montelukast, mupirocin ointment, nitroglycerin, ondansetron, pantoprazole, prednisolone acetate, ranitidine, secukinumab, spironolactone, sucralfate, triamcinolone cream, and clopidogrel, and the following Facility-Administered Medications: sodium hyaluronate and sodium hyaluronate. His primarily concern today is the No chief complaint on file.  Initial Vital Signs:  Pulse/HCG Rate: 88  Temp: 97.9 F (36.6 C) Resp: 18 BP: 135/89 SpO2: 97 %  BMI: Estimated body mass index is 37.59 kg/m as calculated from the following:   Height as of 02/24/18: 5\' 7"  (1.702 m).   Weight as of 02/24/18: 240 lb (108.9 kg).  Risk Assessment: Allergies: Reviewed. He has No Known Allergies.  Allergy Precautions: None required Coagulopathies: Reviewed. None identified.  Blood-thinner therapy: None at this time Active Infection(s): Reviewed. None identified. Nicholas Reyes is afebrile  Site Confirmation: Nicholas Reyes was asked to confirm the procedure and laterality before marking the site Procedure checklist: Completed Consent: Before the procedure and under the influence of no sedative(s), amnesic(s), or anxiolytics, the patient was informed of the treatment options, risks and possible complications. To fulfill our ethical and legal obligations, as recommended by the American Medical Association's Code of Ethics, I have informed the patient of my clinical impression; the nature and purpose of the treatment or procedure; the risks, benefits, and possible complications of the intervention; the alternatives, including doing nothing; the risk(s) and benefit(s) of the alternative treatment(s) or procedure(s); and the risk(s) and benefit(s) of doing nothing. The patient was provided information about the general risks and possible complications associated with the procedure. These may include, but are not limited to: failure to achieve desired goals, infection, bleeding,  organ or nerve damage, allergic reactions, paralysis, and death. In addition, the patient was informed of those risks and complications associated to the procedure, such as failure to decrease pain; infection; bleeding; organ or nerve damage with subsequent damage to sensory, motor, and/or autonomic systems,  resulting in permanent pain, numbness, and/or weakness of one or several areas of the body; allergic reactions; (i.e.: anaphylactic reaction); and/or death. Furthermore, the patient was informed of those risks and complications associated with the medications. These include, but are not limited to: allergic reactions (i.e.: anaphylactic or anaphylactoid reaction(s)); adrenal axis suppression; blood sugar elevation that in diabetics may result in ketoacidosis or comma; water retention that in patients with history of congestive heart failure may result in shortness of breath, pulmonary edema, and decompensation with resultant heart failure; weight gain; swelling or edema; medication-induced neural toxicity; particulate matter embolism and blood vessel occlusion with resultant organ, and/or nervous system infarction; and/or aseptic necrosis of one or more joints. Finally, the patient was informed that Medicine is not an exact science; therefore, there is also the possibility of unforeseen or unpredictable risks and/or possible complications that may result in a catastrophic outcome. The patient indicated having understood very clearly. We have given the patient no guarantees and we have made no promises. Enough time was given to the patient to ask questions, all of which were answered to the patient's satisfaction. Nicholas Reyes has indicated that he wanted to continue with the procedure. Attestation: I, the ordering provider, attest that I have discussed with the patient the benefits, risks, side-effects, alternatives, likelihood of achieving goals, and potential problems during recovery for the procedure that I  have provided informed consent. Date  Time: 03/14/2018 11:04 AM  Pre-Procedure Preparation:  Monitoring: As per clinic protocol. Respiration, ETCO2, SpO2, BP, heart rate and rhythm monitor placed and checked for adequate function Safety Precautions: Patient was assessed for positional comfort and pressure points before starting the procedure. Time-out: I initiated and conducted the "Time-out" before starting the procedure, as per protocol. The patient was asked to participate by confirming the accuracy of the "Time Out" information. Verification of the correct person, site, and procedure were performed and confirmed by me, the nursing staff, and the patient. "Time-out" conducted as per Joint Commission's Universal Protocol (UP.01.01.01). Time: 1120  Description of Procedure:          Target Area: Knee Joint Approach: Just above the Lateral tibial plateau, lateral to the infrapatellar tendon. Area Prepped: Entire knee area, from the mid-thigh to the mid-shin. Prepping solution: ChloraPrep (2% chlorhexidine gluconate and 70% isopropyl alcohol) Safety Precautions: Aspiration looking for blood return was conducted prior to all injections. At no point did we inject any substances, as a needle was being advanced. No attempts were made at seeking any paresthesias. Safe injection practices and needle disposal techniques used. Medications properly checked for expiration dates. SDV (single dose vial) medications used. Description of the Procedure: Protocol guidelines were followed. The patient was placed in position over the fluoroscopy table. The target area was identified and the area prepped in the usual manner. Skin & deeper tissues infiltrated with local anesthetic. Appropriate amount of time allowed to pass for local anesthetics to take effect. The procedure needles were then advanced to the target area. Proper needle placement secured. Negative aspiration confirmed. Solution injected in intermittent  fashion, asking for systemic symptoms every 0.5cc of injectate. The needles were then removed and the area cleansed, making sure to leave some of the prepping solution back to take advantage of its long term bactericidal properties. Vitals:   03/14/18 1103 03/14/18 1124  BP: 135/89 (!) 142/78  Pulse: 88 86  Resp: 18 20  Temp: 97.9 F (36.6 C)   SpO2: 97%     Start Time: 1120 hrs.  End Time: 1122 hrs. Materials:  Needle(s) Type: Regular needle Gauge: 25G Length: 1.5-in Medication(s): Please see orders for medications and dosing details.  Imaging Guidance:          Type of Imaging Technique: None used Indication(s): N/A Exposure Time: No patient exposure Contrast: None used. Fluoroscopic Guidance: N/A Ultrasound Guidance: N/A Interpretation: N/A  Antibiotic Prophylaxis:   Anti-infectives (From admission, onward)   None     Indication(s): None identified  Post-operative Assessment:  Post-procedure Vital Signs:  Pulse/HCG Rate: 86  Temp: 97.9 F (36.6 C) Resp: 20 BP: (!) 142/78 SpO2: 97 %  EBL: None  Complications: No immediate post-treatment complications observed by team, or reported by patient.  Note: The patient tolerated the entire procedure well. A repeat set of vitals were taken after the procedure and the patient was kept under observation following institutional policy, for this type of procedure. Post-procedural neurological assessment was performed, showing return to baseline, prior to discharge. The patient was provided with post-procedure discharge instructions, including a section on how to identify potential problems. Should any problems arise concerning this procedure, the patient was given instructions to immediately contact us, at any time, without hesitation. In any case, we plan to contact the patient by telephone for a follow-up status report regarding this interventional procedure.  Comments:  No additional relevant information.  Plan of Care    Imaging Orders  No imaging studies ordered today    Procedure Orders     KNEE INJECTION     KNEE INJECTION  Medications ordered for procedure: Meds ordered this encounter  Medications  . lidocaine (PF) (XYLOCAINE) 1 % injection 4 mL  . ropivacaine (PF) 2 mg/mL (0.2%) (NAROPIN) injection 4 mL  . Sodium Hyaluronate SOSY 2 mL  . Sodium Hyaluronate SOSY 2 mL   Medications administered: We administered lidocaine (PF) and ropivacaine (PF) 2 mg/mL (0.2%).  See the medical record for exact dosing, route, and time of administration.  Disposition: Discharge home  Discharge Date & Time: 03/14/2018; 1124 hrs.   Physician-requested Follow-up: Return for Procedure (no sedation): (B) Hyalgan # 3 (Stop Plavix x 7 days), (Blood-thinner Protocol).  Future Appointments  Date Time Provider [place]  03/14/2018 12:30 PM Milinda Pointer, MD [facility] None  03/19/2018  3:00 PM Billey Co, MD [facility] None  03/21/2018  1:30 PM Lin Landsman, MD [facility] None  04/01/2018 10:15 AM Milinda Pointer, MD [facility] None  06/11/2018  9:00 AM Leona Singleton, RN [facility] None   Primary Care Physician: McLean-Scocuzza, Nino Glow, MD Location: [facility] Outpatient Pain Management Facility Note by: Gaspar Cola, MD Date: 03/14/2018; Time: 11:31 AM  Disclaimer:  Medicine is not an exact science. The only guarantee in medicine is that nothing is guaranteed. It is important to note that the decision to proceed with this intervention was based on the information collected from the patient. The Data and conclusions were drawn from the patient's questionnaire, the interview, and the physical examination. Because the information was provided in large part by the patient, it cannot be guaranteed that it has not been purposely or unconsciously manipulated. Every effort has been made to obtain as much relevant data as possible for this evaluation. It is important to note that the conclusions that  lead to this procedure are derived in large part from the available data. Always take into account that the treatment will also be dependent on availability of resources and existing treatment guidelines, considered by other Pain Management Practitioners as being  common knowledge and practice, at the time of the intervention. For Medico-Legal purposes, it is also important to point out that variation in procedural techniques and pharmacological choices are the acceptable norm. The indications, contraindications, technique, and results of the above procedure should only be interpreted and judged by a Board-Certified Interventional Pain Specialist with extensive familiarity and expertise in the same exact procedure and technique.

## 2018-03-14 NOTE — Patient Instructions (Addendum)
STOP PLAVIX FOR 7 DAYS PRIOR TO KNEE INJECTION  ____________________________________________________________________________________________  Post-Procedure Discharge Instructions  Instructions:  Apply ice: Fill a plastic sandwich bag with crushed ice. Cover it with a small towel and apply to injection site. Apply for 15 minutes then remove x 15 minutes. Repeat sequence on day of procedure, until you go to bed. The purpose is to minimize swelling and discomfort after procedure.  Apply heat: Apply heat to procedure site starting the day following the procedure. The purpose is to treat any soreness and discomfort from the procedure.  Food intake: Start with clear liquids (like water) and advance to regular food, as tolerated.   Physical activities: Keep activities to a minimum for the first 8 hours after the procedure.   Driving: If you have received any sedation, you are not allowed to drive for 24 hours after your procedure.  Blood thinner: Restart your blood thinner 6 hours after your procedure. (Only for those taking blood thinners)  Insulin: As soon as you can eat, you may resume your normal dosing schedule. (Only for those taking insulin)  Infection prevention: Keep procedure site clean and dry.  Post-procedure Pain Diary: Extremely important that this be done correctly and accurately. Recorded information will be used to determine the next step in treatment.  Pain evaluated is that of treated area only. Do not include pain from an untreated area.  Complete every hour, on the hour, for the initial 8 hours. Set an alarm to help you do this part accurately.  Do not go to sleep and have it completed later. It will not be accurate.  Follow-up appointment: Keep your follow-up appointment after the procedure. Usually 2 weeks for most procedures. (6 weeks in the case of radiofrequency.) Bring you pain diary.   Expect:  From numbing medicine (AKA: Local Anesthetics): Numbness or  decrease in pain.  Onset: Full effect within 15 minutes of injected.  Duration: It will depend on the type of local anesthetic used. On the average, 1 to 8 hours.   From steroids: Decrease in swelling or inflammation. Once inflammation is improved, relief of the pain will follow.  Onset of benefits: Depends on the amount of swelling present. The more swelling, the longer it will take for the benefits to be seen. In some cases, up to 10 days.  Duration: Steroids will stay in the system x 2 weeks. Duration of benefits will depend on multiple posibilities including persistent irritating factors.  Occasional side-effects: Facial flushing, cramps (if present, drink Gatorade and take over-the-counter Magnesium 450-500 mg once to twice a day).  From procedure: Some discomfort is to be expected once the numbing medicine wears off. This should be minimal if ice and heat are applied as instructed.  Call if:  You experience numbness and weakness that gets worse with time, as opposed to wearing off.  New onset bowel or bladder incontinence. (This applies to Spinal procedures only)  Emergency Numbers:  Durning business hours (Monday - Thursday, 8:00 AM - 4:00 PM) (Friday, 9:00 AM - 12:00 Noon): [phone]  After hours: [phone] ____________________________________________________________________________________________  Post-procedure Information What to expect: Most procedures involve the use of a local anesthetic (numbing medicine), and a steroid (anti-inflammatory medicine).  The local anesthetics may cause temporary numbness and weakness of the legs or arms, depending on the location of the block. This numbness/weakness may last 4-6 hours, depending on the local anesthetic used. In rare instances, it can last up to 24 hours. While numb, you must  be very careful not to injure the extremity.  After any procedure, you could expect the pain to get better within 15-20 minutes. This  relief is temporary and may last 4-6 hours. Once the local anesthetics wears off, you could experience discomfort, possibly more than usual, for up to 10 (ten) days. In the case of radiofrequencies, it may last up to 6 weeks. Surgeries may take up to 8 weeks for the healing process. The discomfort is due to the irritation caused by needles going through skin and muscle. To minimize the discomfort, we recommend using ice the first day, and heat from then on. The ice should be applied for 15 minutes on, and 15 minutes off. Keep repeating this cycle until bedtime. Avoid applying the ice directly to the skin, to prevent frostbite. Heat should be used daily, until the pain improves (4-10 days). Be careful not to burn yourself.  Occasionally you may experience muscle spasms or cramps. These occur as a consequence of the irritation caused by the needle sticks to the muscle and the blood that will inevitably be lost into the surrounding muscle tissue. Blood tends to be very irritating to tissues, which tend to react by going into spasm. These spasms may start the same day of your procedure, but they may also take days to develop. This late onset type of spasm or cramp is usually caused by electrolyte imbalances triggered by the steroids, at the level of the kidney. Cramps and spasms tend to respond well to muscle relaxants, multivitamins (some are triggered by the procedure, but may have their origins in vitamin deficiencies), and "Gatorade", or any sports drinks that can replenish any electrolyte imbalances. (If you are a diabetic, ask your pharmacist to get you a sugar-free brand.) Warm showers or baths may also be helpful. Stretching exercises are highly recommended. General Instructions:  Be alert for signs of possible infection: redness, swelling, heat, red streaks, elevated temperature, and/or fever. These typically appear 4 to 6 days after the procedure. Immediately notify your doctor if you experience unusual  bleeding, difficulty breathing, or loss of bowel or bladder control. If you experience increased pain, do not increase your pain medicine intake, unless instructed by your pain physician. Post-Procedure Care:  Be careful in moving about. Muscle spasms in the area of the injection may occur. Applying ice or heat to the area is often helpful. The incidence of spinal headaches after epidural injections ranges between 1.4% and 6%. If you develop a headache that does not seem to respond to conservative therapy, please let your physician know. This can be treated with an epidural blood patch.   Post-procedure numbness or redness is to be expected, however it should average 4 to 6 hours. If numbness and weakness of your extremities begins to develop 4 to 6 hours after your procedure, and is felt to be progressing and worsening, immediately contact your physician.   Diet:  If you experience nausea, do not eat until this sensation goes away. If you had a "Stellate Ganglion Block" for upper extremity "Reflex Sympathetic Dystrophy", do not eat or drink until your hoarseness goes away. In any case, always start with liquids first and if you tolerate them well, then slowly progress to more solid foods. Activity:  For the first 4 to 6 hours after the procedure, use caution in moving about as you may experience numbness and/or weakness. Use caution in cooking, using household electrical appliances, and climbing steps. If you need to reach your Doctor call our  office: [phone] Monday-Thursday 8:00 am - 4:00 PM    Fridays: Closed     In case of an emergency: In case of emergency, call 911 or go to the nearest emergency room and have the physician there call us.  Interpretation of Procedure Every nerve block has two components: a diagnostic component, and a treatment component. Unrealistic expectations are the most common causes of "perceived failure".  In a perfect world, a single nerve block should be able to  completely and permanently eliminate the pain. Sadly, the world is not perfect.  Most pain management nerve blocks are performed using local anesthetics and steroids. Steroids are responsible for any long-term benefit that you may experience. Their purpose is to decrease any chronic swelling that may exist in the area. Steroids begin to work immediately after being injected. However, most patients will not experience any benefits until 5 to 10 days after the injection, when the swelling has come down to the point where they can tell a difference. Steroids will only help if there is swelling to be treated. As such, they can assist with the diagnosis. If effective, they suggest an inflammatory component to the pain, and if ineffective, they rule out inflammation as the main cause or component of the problem. If the problem is one of mechanical compression, you will get no benefit from those steroids.   In the case of local anesthetics, they have a crucial role in the diagnosis of your condition. Most will begin to work within15 to 20 minutes after injection. The duration will depend on the type used (short- vs. Long-acting). It is of outmost importance that patients keep tract of their pain, after the procedure. To assist with this matter, a "Post-procedure Pain Diary" is provided. Make sure to complete it and to bring it back to your follow-up appointment.  As long as the patient keeps accurate, detailed records of their symptoms after every procedure, and returns to have those interpreted, every procedure will provide [place] with invaluable information. Even a block that does not provide the patient with any relief, will always provide [place] with information about the mechanism and the origin of the pain. The only time a nerve block can be considered a waste of time is when patients do not keep track of the results, or do not keep their post-procedure appointment.  Reporting the results back to your physician The  Pain Score  Pain is a subjective complaint. It cannot be seen, touched, or measured. We depend entirely on the patient's report of the pain in order to assess your condition and treatment. To evaluate the pain, we use a pain scale, where "0" means "No Pain", and a "10" is "the worst possible pain that you can even imagine" (i.e. something like been eaten alive by a shark or being torn apart by a lion).   You will frequently be asked to rate your pain. Please be as accurate, remember that medical decisions will be based on your responses. Please do not rate your pain above a 10. Doing so is actually interpreted as "symptom magnification" (exaggeration), as well as lack of understanding with regards to the scale. To put this into perspective, when you tell us that your pain is at a 10 (ten), what you are saying is that there is nothing we can do to make this pain any worse. (Carefully think about that.)

## 2018-03-15 ENCOUNTER — Telehealth: Payer: Self-pay

## 2018-03-15 NOTE — Telephone Encounter (Signed)
Post procedure phone call.  lm 

## 2018-03-19 ENCOUNTER — Encounter: Payer: Self-pay | Admitting: Urology

## 2018-03-19 ENCOUNTER — Ambulatory Visit: Payer: Self-pay | Admitting: Urology

## 2018-03-21 ENCOUNTER — Encounter: Payer: Self-pay | Admitting: Gastroenterology

## 2018-03-21 ENCOUNTER — Ambulatory Visit: Payer: Medicare HMO | Admitting: Gastroenterology

## 2018-03-21 DIAGNOSIS — Z1211 Encounter for screening for malignant neoplasm of colon: Secondary | ICD-10-CM

## 2018-03-25 ENCOUNTER — Telehealth: Payer: Self-pay

## 2018-03-25 NOTE — Telephone Encounter (Signed)
Lm for patient to return call to reschd his missed app. He will need to go the ED for the stomach pains as we do not treat that per Judson Roch, but he can reschd his missed app. Next available Sharyn Lull

## 2018-03-26 NOTE — Progress Notes (Signed)
Patient's Name: Nicholas Reyes  MRN: [id number]  Referring Provider: Orland Mustard *  DOB: [birth year]  PCP: McLean-Scocuzza, Nino Glow, MD  DOS: 03/27/2018  Note by: Gaspar Cola, MD  Service setting: Ambulatory outpatient  Specialty: Interventional Pain Management  Location: [facility] (AMB) Pain Management Facility    Patient type: Established   Primary Reason(s) for Visit: Encounter for post-procedure evaluation of chronic illness with mild to moderate exacerbation CC: Hand Pain  Procedure:          Anesthesia, Analgesia, Anxiolysis:  Type: Therapeutic Intra-Articular Hyalgan Knee Injection #3  Region: Lateral infrapatellar Knee Region Level: Knee Joint Laterality: Bilateral  Type: Local Anesthesia Indication(s): Analgesia         Local Anesthetic: Lidocaine 1-2% Route: Infiltration ([place]/IM) IV Access: Declined Sedation: Declined   Position: Sitting   Indications: 1. 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 Chronic knee pain (Primary Area of Pain) (Bilateral) (R>L)   3. Tricompartment osteoarthritis of knee (Right)   4. Chronic anticoagulation (PLAVIX)   5. Chronic patellofemoral knee pain (Left)    Pain Score: Pre-procedure: 0-No pain/10 Post-procedure: 0-No pain/10  HPI  Nicholas Reyes is a 62 y.o. year old, male patient, who comes today for a post-procedure evaluation. He has Benign essential hypertension; Coronary atherosclerosis; Psoriasis; Skin sore; Chronic airway obstruction, not elsewhere classified; Diabetes mellitus without complication ([place]); Vitamin D deficiency; Arthritis; DM2 (diabetes mellitus, type 2) ([place]); Cognitive impairment; Mixed hyperlipidemia; Allergic rhinitis; Asthma; Benign neoplasm of colon; Chest pain with high risk for cardiac etiology; Depressive disorder, not elsewhere classified; Diarrhea; Dyspnea; Edema; Exercise-induced angina ([place]); Family history of ischemic heart disease (IHD); Headaches due to old head injury; Infective otitis  externa; Otalgia; Type 2 diabetes mellitus without complications ([place]); HTN (hypertension); Essential hypertension; Hypertension; Other psoriasis; Chronic knee pain (Primary Area of Pain) (Bilateral) (R>L); Chronic hand pain (Secondary Area of Pain) (Bilateral) (L>R); Chronic pain syndrome; Long term current use of opiate analgesic; Pharmacologic therapy; Disorder of skeletal system; Problems influencing health status; Coronary artery disease involving native coronary artery of native heart without angina pectoris; Osteoarthritis of knee (Bilateral) (R>L); Tricompartment osteoarthritis of knee (Right); Osteoarthritis of hands (Bilateral); Family history of ischemic heart disease and other diseases of the circulatory system; Abdominal pain; Chronic anticoagulation (PLAVIX); Right flank pain; Chronic L5-S1 lumbar pars defect (Left); Grade 1 Anterolisthesis of L5 over S1; Chronic knee pain (Right); Chronic knee pain (Left); Chronic patellofemoral knee pain (Left); and History of nephrolithiasis on their problem list. His primarily concern today is the Hand Pain  Pain Assessment: Location: Right, Left Knee Radiating: denies Onset: More than a month ago Quality: Discomfort Severity: 0-No pain/10 (subjective, self-reported pain score)  Note: Reported level is compatible with observation.                               Timing: Intermittent Modifying factors: procedures BP: 132/80  HR: 86  Nicholas Reyes comes in today for post-procedure evaluation.  However, we have learned that he did not restart his Plavix, despite the fact that we had instructed him to do so.  Because he has not taken that Plavix, we will take this opportunity to go ahead and proceed today with his third bilateral intra-articular Hyalgan knee injection since the results have shown to be effective in improving his bilateral knee pain.  In fact, at this point he is not experiencing any significant pain but we will complete the series so  that we  can obtain as much relief and for as long of a period as we can.  I have again reminded the patient to go back on his Plavix in approximately 2 hours.  I have also instructed him to stop it for only 5 days prior to his knee injections.  Further details on both, my assessment(s), as well as the proposed treatment plan, please see below.  Post-Procedure Assessment  03/14/2018 Procedure: Therapeutic bilateral intra-articular Hyalgan knee injection #2  Pre-procedure pain score:  5/10 Post-procedure pain score: 0/10 (100% relief) Influential Factors: BMI: 36.49 kg/m Intra-procedural challenges: None observed.         Assessment challenges: None detected.              Reported side-effects: None.        Post-procedural adverse reactions or complications: None reported         Sedation: No sedation used. When no sedatives are used, the analgesic levels obtained are directly associated to the effectiveness of the local anesthetics. However, when sedation is provided, the level of analgesia obtained during the initial 1 hour following the intervention, is believed to be the result of a combination of factors. These factors may include, but are not limited to: 1. The effectiveness of the local anesthetics used. 2. The effects of the analgesic(s) and/or anxiolytic(s) used. 3. The degree of discomfort experienced by the patient at the time of the procedure. 4. The patients ability and reliability in recalling and recording the events. 5. The presence and influence of possible secondary gains and/or psychosocial factors. Reported result: Relief experienced during the 1st hour after the procedure: 100 % (Ultra-Short Term Relief)            Interpretative annotation: Clinically appropriate result. Analgesia during this period is likely to be Local Anesthetic and/or IV Sedative (Analgesic/Anxiolytic) related.          Effects of local anesthetic: The analgesic effects attained during this period are directly  associated to the localized infiltration of local anesthetics and therefore cary significant diagnostic value as to the etiological location, or anatomical origin, of the pain. Expected duration of relief is directly dependent on the pharmacodynamics of the local anesthetic used. Long-acting (4-6 hours) anesthetics used.  Reported result: Relief during the next 4 to 6 hour after the procedure: 100 % (Short-Term Relief)            Interpretative annotation: Clinically appropriate result. Analgesia during this period is likely to be Local Anesthetic-related.          Long-term benefit: Defined as the period of time past the expected duration of local anesthetics (1 hour for short-acting and 4-6 hours for long-acting). With the possible exception of prolonged sympathetic blockade from the local anesthetics, benefits during this period are typically attributed to, or associated with, other factors such as analgesic sensory neuropraxia, antiinflammatory effects, or beneficial biochemical changes provided by agents other than the local anesthetics.  Reported result: Extended relief following procedure: 100 %(for 2 weeks) (Long-Term Relief)            Interpretative annotation: Clinically possible results. Good relief. No permanent benefit expected. Inflammation plays a part in the etiology to the pain.          Current benefits: Defined as reported results that persistent at this point in time.   Analgesia: 100 % Nicholas Reyes reports improvement of arthralgia. Function: Nicholas Reyes reports improvement in function ROM: Nicholas Reyes reports improvement in ROM Interpretative  annotation: Recurrence of symptoms. No permanent benefit expected. Effective diagnostic intervention.          Interpretation: Results would suggest a successful diagnostic intervention.                  Plan:  Proceed with treatment No.: 3          Laboratory Chemistry  Inflammation Markers (CRP: Acute Phase) (ESR: Chronic Phase) Lab  Results  Component Value Date   CRP 1.1 11/15/2017   ESRSEDRATE 12 11/15/2017   LATICACIDVEN 1.3 12/30/2017                         Rheumatology Markers Lab Results  Component Value Date   RF <14 11/15/2017   ANA NEGATIVE 11/15/2017                        Renal Markers Lab Results  Component Value Date   BUN 19 02/24/2018   CREATININE 0.84 02/24/2018   GFRAA >60 02/24/2018   GFRNONAA >60 02/24/2018                             Hepatic Markers Lab Results  Component Value Date   AST 16 02/24/2018   ALT 13 02/24/2018   ALBUMIN 4.1 02/24/2018                        Neuropathy Markers Lab Results  Component Value Date   VITAMINB12 407 01/02/2018   HGBA1C 6.6 (H) 11/15/2017                        Hematology Parameters Lab Results  Component Value Date   PLT 144 (L) 02/24/2018   HGB 13.5 02/24/2018   HCT 44.4 02/24/2018                        CV Markers Lab Results  Component Value Date   TROPONINI < 0.02 06/25/2011                         Note: Lab results reviewed.  Recent Imaging Results  No results found for this or any previous visit. Interpretation Report: Fluoroscopy was used during the procedure to assist with needle guidance. The images were interpreted intraoperatively by the requesting physician.  Meds   Current Outpatient Medications:  .  albuterol (PROVENTIL HFA;VENTOLIN HFA) 108 (90 BASE) MCG/ACT inhaler, Inhale 2 puffs into the lungs every 4 (four) hours as needed. For shortness of breath, Disp: , Rfl:  .  amLODipine (NORVASC) 5 MG tablet, Take 5 mg by mouth daily., Disp: , Rfl:  .  aspirin 81 MG EC tablet, Take 81 mg by mouth daily. , Disp: , Rfl:  .  atorvastatin (LIPITOR) 20 MG tablet, Take 20 mg by mouth daily., Disp: , Rfl:  .  budesonide-formoterol (SYMBICORT) 80-4.5 MCG/ACT inhaler, Inhale 2 puffs into the lungs 2 (two) times daily., Disp: , Rfl:  .  cetirizine (ZYRTEC) 10 MG tablet, Take 10 mg by mouth daily., Disp: , Rfl:  .   Cholecalciferol 50000 units capsule, Take 1 capsule (50,000 Units total) by mouth once a week., Disp: 13 capsule, Rfl: 1 .  clopidogrel (PLAVIX) 75 MG tablet, Take 75 mg by mouth daily., Disp: , Rfl:  .  diclofenac (  FLECTOR) 1.3 % PTCH, Place 1 patch onto the skin 2 (two) times daily., Disp: 60 patch, Rfl: 2 .  doxycycline (VIBRA-TABS) 100 MG tablet, Take 1 tablet (100 mg total) by mouth 2 (two) times daily. With food, Disp: 14 tablet, Rfl: 0 .  DULoxetine (CYMBALTA) 60 MG capsule, Take 1 capsule (60 mg total) by mouth daily., Disp: 90 capsule, Rfl: 3 .  furosemide (LASIX) 40 MG tablet, Take 40 mg by mouth daily., Disp: , Rfl:  .  hydrOXYzine (ATARAX/VISTARIL) 25 MG tablet, Take 1 tablet (25 mg total) by mouth 3 (three) times daily as needed., Disp: 30 tablet, Rfl: 0 .  isosorbide mononitrate (IMDUR) 30 MG 24 hr tablet, Take 1 tablet (30 mg total) by mouth daily., Disp: 90 tablet, Rfl: 3 .  ketorolac (ACULAR) 0.5 % ophthalmic solution, , Disp: , Rfl:  .  meloxicam (MOBIC) 7.5 MG tablet, Take 1 tablet (7.5 mg total) by mouth 2 (two) times daily as needed for pain., Disp: 60 tablet, Rfl: 2 .  metFORMIN (GLUCOPHAGE) 500 MG tablet, Take 1 tablet (500 mg total) by mouth daily with breakfast., Disp: 90 tablet, Rfl: 3 .  metoprolol tartrate (LOPRESSOR) 25 MG tablet, Take 25 mg by mouth 2 (two) times daily., Disp: , Rfl:  .  montelukast (SINGULAIR) 10 MG tablet, Take 10 mg by mouth at bedtime., Disp: , Rfl:  .  mupirocin ointment (BACTROBAN) 2 %, Apply to affected area 3 times daily, Disp: 15 g, Rfl: 0 .  nitroGLYCERIN (NITROSTAT) 0.4 MG SL tablet, Place 0.4 mg under the tongue every 5 (five) minutes as needed. For chest pain, Disp: , Rfl:  .  ondansetron (ZOFRAN) 4 MG tablet, Take 1 tablet (4 mg total) by mouth every 8 (eight) hours as needed for nausea or vomiting., Disp: 20 tablet, Rfl: 0 .  pantoprazole (PROTONIX) 20 MG tablet, Take 1 tablet (20 mg total) by mouth daily., Disp: 90 tablet, Rfl: 0 .   prednisoLONE acetate (PRED FORTE) 1 % ophthalmic suspension, , Disp: , Rfl:  .  ranitidine (ZANTAC) 150 MG/10ML syrup, 150 mg 2 (two) times daily. Taking 10 ml bid, Disp: , Rfl:  .  Secukinumab (COSENTYX 300 DOSE [place]), Inject into the skin every 30 (thirty) days., Disp: , Rfl:  .  spironolactone (ALDACTONE) 25 MG tablet, Take 25 mg by mouth daily., Disp: , Rfl:  .  sucralfate (CARAFATE) 1 g tablet, Take 1 tablet (1 g total) by mouth 4 (four) times daily -  with meals and at bedtime., Disp: 120 tablet, Rfl: 0 .  triamcinolone cream (KENALOG) 0.1 %, Apply 1 application topically 2 (two) times daily. Not face, groin, underarms, Disp: 454 g, Rfl: 11  Current Facility-Administered Medications:  .  lidocaine (PF) (XYLOCAINE) 1 % injection 4 mL, 4 mL, Infiltration, Once, Milinda Pointer, MD .  ropivacaine (PF) 2 mg/mL (0.2%) (NAROPIN) injection 4 mL, 4 mL, Intra-articular, Once, Milinda Pointer, MD .  Sodium Hyaluronate SOSY 2 mL, 2 mL, Intra-articular, Once, Milinda Pointer, MD .  Sodium Hyaluronate SOSY 2 mL, 2 mL, Intra-articular, Once, Milinda Pointer, MD  ROS  Constitutional: Denies any fever or chills Gastrointestinal: No reported hemesis, hematochezia, vomiting, or acute GI distress Musculoskeletal: Denies any acute onset joint swelling, redness, loss of ROM, or weakness Neurological: No reported episodes of acute onset apraxia, aphasia, dysarthria, agnosia, amnesia, paralysis, loss of coordination, or loss of consciousness  Allergies  Nicholas Reyes has No Known Allergies.  PFSH  Drug: Nicholas Reyes  reports that he  does not use drugs. Alcohol:  reports that he does not drink alcohol. Tobacco:  reports that he has quit smoking. He has never used smokeless tobacco. Medical:  has a past medical history of Arthritis, CAD (coronary artery disease), COPD (chronic obstructive pulmonary disease) ([place]), Depression, Diabetes mellitus, Heart disease, Hypertension, MI (myocardial infarction)  ([place]), Migraines, and Psoriasis. Surgical: Nicholas Reyes  has a past surgical history that includes Cataract extraction (Bilateral); Cholecystectomy; Appendectomy; Tonsillectomy; STENT PLACEMENT VASCULAR ([place] HX); right knee surgery; Tonsillectomy and adenoidectomy; hemorrhoid surgery ; Eye surgery; Coronary angioplasty with stent; Hemorrhoid surgery; Knee surgery; and LEFT HEART CATH AND CORONARY ANGIOGRAPHY (Left, 01/30/2018). Family: family history includes Cancer in his mother; Heart disease in his father; Stroke in his father.  Constitutional Exam  General appearance: Well nourished, well developed, and well hydrated. In no apparent acute distress Vitals:   03/27/18 0937  BP: 132/80  Pulse: 86  Temp: 98.5 F (36.9 C)  SpO2: 90%  Height: 5' 8"  (1.727 m)   BMI Assessment: Estimated body mass index is 36.49 kg/m as calculated from the following:   Height as of this encounter: 5' 8"  (1.727 m).   Weight as of 02/24/18: 240 lb (108.9 kg).  BMI interpretation table: BMI level Category Range association with higher incidence of chronic pain  <18 kg/m2 Underweight   18.5-24.9 kg/m2 Ideal body weight   25-29.9 kg/m2 Overweight Increased incidence by 20%  30-34.9 kg/m2 Obese (Class I) Increased incidence by 68%  35-39.9 kg/m2 Severe obesity (Class II) Increased incidence by 136%  >40 kg/m2 Extreme obesity (Class III) Increased incidence by 254%   Patient's current BMI Ideal Body weight  Body mass index is 36.49 kg/m. Patient weight not recorded   BMI Readings from Last 4 Encounters:  03/27/18 36.49 kg/m  02/24/18 37.59 kg/m  02/13/18 36.73 kg/m  02/07/18 37.59 kg/m   Wt Readings from Last 4 Encounters:  02/24/18 240 lb (108.9 kg)  02/13/18 234 lb 8 oz (106.4 kg)  02/07/18 240 lb (108.9 kg)  01/30/18 240 lb (108.9 kg)  Psych/Mental status: Alert, oriented x 3 (person, place, & time)       Eyes: PERLA Respiratory: No evidence of acute respiratory distress  Cervical Spine Area  Exam  Skin & Axial Inspection: No masses, redness, edema, swelling, or associated skin lesions Alignment: Symmetrical Functional ROM: Unrestricted ROM      Stability: No instability detected Muscle Tone/Strength: Functionally intact. No obvious neuro-muscular anomalies detected. Sensory (Neurological): Unimpaired Palpation: No palpable anomalies              Upper Extremity (UE) Exam    Side: Right upper extremity  Side: Left upper extremity  Skin & Extremity Inspection: Skin color, temperature, and hair growth are WNL. No peripheral edema or cyanosis. No masses, redness, swelling, asymmetry, or associated skin lesions. No contractures.  Skin & Extremity Inspection: Skin color, temperature, and hair growth are WNL. No peripheral edema or cyanosis. No masses, redness, swelling, asymmetry, or associated skin lesions. No contractures.  Functional ROM: Unrestricted ROM          Functional ROM: Unrestricted ROM          Muscle Tone/Strength: Functionally intact. No obvious neuro-muscular anomalies detected.  Muscle Tone/Strength: Functionally intact. No obvious neuro-muscular anomalies detected.  Sensory (Neurological): Unimpaired          Sensory (Neurological): Unimpaired          Palpation: No palpable anomalies  Palpation: No palpable anomalies              Provocative Test(s):  Phalen's test: deferred Tinel's test: deferred Apley's scratch test (touch opposite shoulder):  Action 1 (Across chest): deferred Action 2 (Overhead): deferred Action 3 (LB reach): deferred   Provocative Test(s):  Phalen's test: deferred Tinel's test: deferred Apley's scratch test (touch opposite shoulder):  Action 1 (Across chest): deferred Action 2 (Overhead): deferred Action 3 (LB reach): deferred    Thoracic Spine Area Exam  Skin & Axial Inspection: No masses, redness, or swelling Alignment: Symmetrical Functional ROM: Unrestricted ROM Stability: No instability detected Muscle  Tone/Strength: Functionally intact. No obvious neuro-muscular anomalies detected. Sensory (Neurological): Unimpaired Muscle strength & Tone: No palpable anomalies  Lumbar Spine Area Exam  Skin & Axial Inspection: No masses, redness, or swelling Alignment: Symmetrical Functional ROM: Unrestricted ROM       Stability: No instability detected Muscle Tone/Strength: Functionally intact. No obvious neuro-muscular anomalies detected. Sensory (Neurological): Unimpaired Palpation: No palpable anomalies       Provocative Tests: Hyperextension/rotation test: deferred today       Lumbar quadrant test (Kemp's test): deferred today       Lateral bending test: deferred today       Patrick's Maneuver: deferred today                   FABER test: deferred today                   S-I anterior distraction/compression test: deferred today         S-I lateral compression test: deferred today         S-I Thigh-thrust test: deferred today         S-I Gaenslen's test: deferred today          Gait & Posture Assessment  Ambulation: Unassisted Gait: Relatively normal for age and body habitus Posture: WNL   Lower Extremity Exam    Side: Right lower extremity  Side: Left lower extremity  Stability: No instability observed          Stability: No instability observed          Skin & Extremity Inspection: Skin color, temperature, and hair growth are WNL. No peripheral edema or cyanosis. No masses, redness, swelling, asymmetry, or associated skin lesions. No contractures.  Skin & Extremity Inspection: Skin color, temperature, and hair growth are WNL. No peripheral edema or cyanosis. No masses, redness, swelling, asymmetry, or associated skin lesions. No contractures.  Functional ROM: Improved after treatment for knee joint          Functional ROM: Improved after treatment for knee joint          Muscle Tone/Strength: Functionally intact. No obvious neuro-muscular anomalies detected.  Muscle Tone/Strength:  Functionally intact. No obvious neuro-muscular anomalies detected.  Sensory (Neurological): Improved   Sensory (Neurological): Improved   DTR: Patellar: deferred today Achilles: deferred today Plantar: deferred today  DTR: Patellar: deferred today Achilles: deferred today Plantar: deferred today  Palpation: No palpable anomalies  Palpation: No palpable anomalies   Assessment  Primary Diagnosis & Pertinent Problem List: The primary encounter diagnosis was Osteoarthritis of knee (Bilateral) (R>L). Diagnoses of Chronic knee pain (Primary Area of Pain) (Bilateral) (R>L), Tricompartment osteoarthritis of knee (Right), Chronic anticoagulation (PLAVIX), and Chronic patellofemoral knee pain (Left) were also pertinent to this visit.  Status Diagnosis  Stable Resolved Stable 1. 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 Chronic knee pain (Primary  Area of Pain) (Bilateral) (R>L)   3. Tricompartment osteoarthritis of knee (Right)   4. Chronic anticoagulation (PLAVIX)   5. Chronic patellofemoral knee pain (Left)     Problems updated and reviewed during this visit: Problem  Chronic L5-S1 lumbar pars defect (Left)   Unilateral L5 pars defect with associated slight anterolisthesis   Grade 1 Anterolisthesis of L5 over S1   Unilateral L5 pars defect with associated slight anterolisthesis.   Chronic knee pain (Right)   IMPRESSION: Tricompartmental osteoarthritis most significant in the medial compartment. There has been progression of the degenerative changes compared to the prior radiograph from 07/10/208.   Chronic knee pain (Left)   IMPRESSION: 1. Mild degenerative osteoarthritis in the patellofemoral and medial compartments. 2. No evidence of fracture, joint effusion or bony lesion.   Chronic patellofemoral knee pain (Left)   IMPRESSION: 1. Mild degenerative osteoarthritis in the patellofemoral and medial compartments. 2. No evidence of fracture, joint effusion or bony lesion.    History of Nephrolithiasis   Pre-op Assessment:  Nicholas Reyes is a 62 y.o. (year old), male patient, seen today for interventional treatment. He  has a past surgical history that includes Cataract extraction (Bilateral); Cholecystectomy; Appendectomy; Tonsillectomy; STENT PLACEMENT VASCULAR ([place] HX); right knee surgery; Tonsillectomy and adenoidectomy; hemorrhoid surgery ; Eye surgery; Coronary angioplasty with stent; Hemorrhoid surgery; Knee surgery; and LEFT HEART CATH AND CORONARY ANGIOGRAPHY (Left, 01/30/2018). Nicholas Reyes has a current medication list which includes the following prescription(s): albuterol, amlodipine, aspirin, atorvastatin, budesonide-formoterol, cetirizine, cholecalciferol, clopidogrel, diclofenac, doxycycline, duloxetine, furosemide, hydroxyzine, isosorbide mononitrate, ketorolac, meloxicam, metformin, metoprolol tartrate, montelukast, mupirocin ointment, nitroglycerin, ondansetron, pantoprazole, prednisolone acetate, ranitidine, secukinumab, spironolactone, sucralfate, and triamcinolone cream, and the following Facility-Administered Medications: lidocaine (pf), ropivacaine (pf) 2 mg/ml (0.2%), sodium hyaluronate, and sodium hyaluronate. His primarily concern today is the Hand Pain  Initial Vital Signs:  Pulse/HCG Rate: 86  Temp: 98.5 F (36.9 C) Resp:   BP: 132/80 SpO2: 90 %  BMI: Estimated body mass index is 36.49 kg/m as calculated from the following:   Height as of this encounter: 5' 8"  (1.727 m).   Weight as of 02/24/18: 240 lb (108.9 kg).  Risk Assessment: Allergies: Reviewed. He has No Known Allergies.  Allergy Precautions: None required Coagulopathies: Reviewed. None identified.  Blood-thinner therapy: None at this time Active Infection(s): Reviewed. None identified. Nicholas Reyes is afebrile  Site Confirmation: Nicholas Reyes was asked to confirm the procedure and laterality before marking the site Procedure checklist: Completed Consent: Before the procedure and  under the influence of no sedative(s), amnesic(s), or anxiolytics, the patient was informed of the treatment options, risks and possible complications. To fulfill our ethical and legal obligations, as recommended by the American Medical Association's Code of Ethics, I have informed the patient of my clinical impression; the nature and purpose of the treatment or procedure; the risks, benefits, and possible complications of the intervention; the alternatives, including doing nothing; the risk(s) and benefit(s) of the alternative treatment(s) or procedure(s); and the risk(s) and benefit(s) of doing nothing. The patient was provided information about the general risks and possible complications associated with the procedure. These may include, but are not limited to: failure to achieve desired goals, infection, bleeding, organ or nerve damage, allergic reactions, paralysis, and death. In addition, the patient was informed of those risks and complications associated to the procedure, such as failure to decrease pain; infection; bleeding; organ or nerve damage with subsequent damage to sensory, motor, and/or autonomic systems, resulting in permanent pain, numbness, and/or  weakness of one or several areas of the body; allergic reactions; (i.e.: anaphylactic reaction); and/or death. Furthermore, the patient was informed of those risks and complications associated with the medications. These include, but are not limited to: allergic reactions (i.e.: anaphylactic or anaphylactoid reaction(s)); adrenal axis suppression; blood sugar elevation that in diabetics may result in ketoacidosis or comma; water retention that in patients with history of congestive heart failure may result in shortness of breath, pulmonary edema, and decompensation with resultant heart failure; weight gain; swelling or edema; medication-induced neural toxicity; particulate matter embolism and blood vessel occlusion with resultant organ, and/or  nervous system infarction; and/or aseptic necrosis of one or more joints. Finally, the patient was informed that Medicine is not an exact science; therefore, there is also the possibility of unforeseen or unpredictable risks and/or possible complications that may result in a catastrophic outcome. The patient indicated having understood very clearly. We have given the patient no guarantees and we have made no promises. Enough time was given to the patient to ask questions, all of which were answered to the patient's satisfaction. Nicholas Reyes has indicated that he wanted to continue with the procedure. Attestation: I, the ordering provider, attest that I have discussed with the patient the benefits, risks, side-effects, alternatives, likelihood of achieving goals, and potential problems during recovery for the procedure that I have provided informed consent. Date  Time: 03/27/2018  9:41 AM  Pre-Procedure Preparation:  Monitoring: As per clinic protocol. Respiration, ETCO2, SpO2, BP, heart rate and rhythm monitor placed and checked for adequate function Safety Precautions: Patient was assessed for positional comfort and pressure points before starting the procedure. Time-out: I initiated and conducted the "Time-out" before starting the procedure, as per protocol. The patient was asked to participate by confirming the accuracy of the "Time Out" information. Verification of the correct person, site, and procedure were performed and confirmed by me, the nursing staff, and the patient. "Time-out" conducted as per Joint Commission's Universal Protocol (UP.01.01.01). Time: 1026  Description of Procedure:          Target Area: Knee Joint Approach: Just above the Lateral tibial plateau, lateral to the infrapatellar tendon. Area Prepped: Entire knee area, from the mid-thigh to the mid-shin. Prepping solution: ChloraPrep (2% chlorhexidine gluconate and 70% isopropyl alcohol) Safety Precautions: Aspiration looking  for blood return was conducted prior to all injections. At no point did we inject any substances, as a needle was being advanced. No attempts were made at seeking any paresthesias. Safe injection practices and needle disposal techniques used. Medications properly checked for expiration dates. SDV (single dose vial) medications used. Description of the Procedure: Protocol guidelines were followed. The patient was placed in position over the fluoroscopy table. The target area was identified and the area prepped in the usual manner. Skin & deeper tissues infiltrated with local anesthetic. Appropriate amount of time allowed to pass for local anesthetics to take effect. The procedure needles were then advanced to the target area. Proper needle placement secured. Negative aspiration confirmed. Solution injected in intermittent fashion, asking for systemic symptoms every 0.5cc of injectate. The needles were then removed and the area cleansed, making sure to leave some of the prepping solution back to take advantage of its long term bactericidal properties. Vitals:   03/27/18 0937  BP: 132/80  Pulse: 86  Temp: 98.5 F (36.9 C)  SpO2: 90%  Height: 5' 8"  (1.727 m)    Start Time: 1024 hrs. End Time: 1026 hrs. Materials:  Needle(s) Type: Regular needle  Gauge: 25G Length: 1.5-in Medication(s): Please see orders for medications and dosing details.  Imaging Guidance:          Type of Imaging Technique: None used Indication(s): N/A Exposure Time: No patient exposure Contrast: None used. Fluoroscopic Guidance: N/A Ultrasound Guidance: N/A Interpretation: N/A  Antibiotic Prophylaxis:   Anti-infectives (From admission, onward)   None     Indication(s): None identified  Post-operative Assessment:  Post-procedure Vital Signs:  Pulse/HCG Rate: 86  Temp: 98.5 F (36.9 C) Resp:   BP: 132/80 SpO2: 90 %  EBL: None  Complications: No immediate post-treatment complications observed by team, or  reported by patient.  Note: The patient tolerated the entire procedure well. A repeat set of vitals were taken after the procedure and the patient was kept under observation following institutional policy, for this type of procedure. Post-procedural neurological assessment was performed, showing return to baseline, prior to discharge. The patient was provided with post-procedure discharge instructions, including a section on how to identify potential problems. Should any problems arise concerning this procedure, the patient was given instructions to immediately contact us, at any time, without hesitation. In any case, we plan to contact the patient by telephone for a follow-up status report regarding this interventional procedure.  Comments:  No additional relevant information.  Plan of Care  Pharmacotherapy (Medications Ordered): Meds ordered this encounter  Medications  . lidocaine (PF) (XYLOCAINE) 1 % injection 4 mL  . ropivacaine (PF) 2 mg/mL (0.2%) (NAROPIN) injection 4 mL  . Sodium Hyaluronate SOSY 2 mL  . Sodium Hyaluronate SOSY 2 mL   Medications administered today: Haskell Riling had no medications administered during this visit.  Procedure Orders     KNEE INJECTION     KNEE INJECTION     KNEE INJECTION Lab Orders  No laboratory test(s) ordered today   Imaging Orders  No imaging studies ordered today   Referral Orders  No referral(s) requested today   Interventional management options: Planned, scheduled, and/or pending:   NOTE: Stop PLAVIX 7-10 days before procedures.  The patient was asked to stop the Plavix for only 5 days prior to knee injections. Since the patient had not started his Plavix after the procedure, today we will take the opportunity to do his third injection. Therapeutic bilateral, series of 5, intra-articular Hyalgan knee injections #4    Considering:   Diagnostic bilateral intra-articular knee injections with local anesthetic and steroid   Therapeutic bilateral, series of 5, intra-articular Hyalgan knee injections  Diagnostic bilateral genicular nerve block  Possible bilateral genicular nerve RFA  Diagnostic bilateral intra-articular hand injection    Palliative PRN treatment(s):   None at this time   Provider-requested follow-up: Return for PPE (2 wks) + Procedure (no sedation): (B) Hyalgan #4.  Future Appointments  Date Time Provider [place]  04/15/2018 10:15 AM Milinda Pointer, MD [facility] None  04/24/2018 10:45 AM Billey Co, MD [facility] None  06/11/2018  9:00 AM Leona Singleton, RN [facility] None   Primary Care Physician: McLean-Scocuzza, Nino Glow, MD Location: [facility] Note by: Gaspar Cola, MD Date: 03/27/2018; Time: 10:44 AM

## 2018-03-27 ENCOUNTER — Encounter: Payer: Self-pay | Admitting: Pain Medicine

## 2018-03-27 ENCOUNTER — Ambulatory Visit: Payer: Medicare HMO | Attending: Pain Medicine | Admitting: Pain Medicine

## 2018-03-27 ENCOUNTER — Other Ambulatory Visit: Payer: Self-pay

## 2018-03-27 VITALS — BP 132/83 | HR 86 | Temp 98.5°F | Ht 68.0 in

## 2018-03-27 DIAGNOSIS — E782 Mixed hyperlipidemia: Secondary | ICD-10-CM | POA: Insufficient documentation

## 2018-03-27 DIAGNOSIS — Z791 Long term (current) use of non-steroidal anti-inflammatories (NSAID): Secondary | ICD-10-CM | POA: Insufficient documentation

## 2018-03-27 DIAGNOSIS — Z79899 Other long term (current) drug therapy: Secondary | ICD-10-CM | POA: Diagnosis not present

## 2018-03-27 DIAGNOSIS — M25562 Pain in left knee: Secondary | ICD-10-CM | POA: Diagnosis not present

## 2018-03-27 DIAGNOSIS — I10 Essential (primary) hypertension: Secondary | ICD-10-CM | POA: Diagnosis not present

## 2018-03-27 DIAGNOSIS — Z7984 Long term (current) use of oral hypoglycemic drugs: Secondary | ICD-10-CM | POA: Insufficient documentation

## 2018-03-27 DIAGNOSIS — M4310 Spondylolisthesis, site unspecified: Secondary | ICD-10-CM | POA: Insufficient documentation

## 2018-03-27 DIAGNOSIS — Z7901 Long term (current) use of anticoagulants: Secondary | ICD-10-CM

## 2018-03-27 DIAGNOSIS — I2510 Atherosclerotic heart disease of native coronary artery without angina pectoris: Secondary | ICD-10-CM | POA: Insufficient documentation

## 2018-03-27 DIAGNOSIS — G8929 Other chronic pain: Secondary | ICD-10-CM | POA: Diagnosis not present

## 2018-03-27 DIAGNOSIS — M79643 Pain in unspecified hand: Secondary | ICD-10-CM | POA: Diagnosis present

## 2018-03-27 DIAGNOSIS — Z79891 Long term (current) use of opiate analgesic: Secondary | ICD-10-CM | POA: Diagnosis not present

## 2018-03-27 DIAGNOSIS — Z955 Presence of coronary angioplasty implant and graft: Secondary | ICD-10-CM | POA: Insufficient documentation

## 2018-03-27 DIAGNOSIS — Z7982 Long term (current) use of aspirin: Secondary | ICD-10-CM | POA: Insufficient documentation

## 2018-03-27 DIAGNOSIS — R4189 Other symptoms and signs involving cognitive functions and awareness: Secondary | ICD-10-CM | POA: Insufficient documentation

## 2018-03-27 DIAGNOSIS — E119 Type 2 diabetes mellitus without complications: Secondary | ICD-10-CM | POA: Insufficient documentation

## 2018-03-27 DIAGNOSIS — H9209 Otalgia, unspecified ear: Secondary | ICD-10-CM | POA: Diagnosis not present

## 2018-03-27 DIAGNOSIS — M4306 Spondylolysis, lumbar region: Secondary | ICD-10-CM | POA: Insufficient documentation

## 2018-03-27 DIAGNOSIS — R079 Chest pain, unspecified: Secondary | ICD-10-CM | POA: Insufficient documentation

## 2018-03-27 DIAGNOSIS — M1711 Unilateral primary osteoarthritis, right knee: Secondary | ICD-10-CM

## 2018-03-27 DIAGNOSIS — M170 Bilateral primary osteoarthritis of knee: Secondary | ICD-10-CM | POA: Diagnosis not present

## 2018-03-27 DIAGNOSIS — Z7902 Long term (current) use of antithrombotics/antiplatelets: Secondary | ICD-10-CM | POA: Diagnosis not present

## 2018-03-27 DIAGNOSIS — Z87442 Personal history of urinary calculi: Secondary | ICD-10-CM | POA: Insufficient documentation

## 2018-03-27 DIAGNOSIS — Z8249 Family history of ischemic heart disease and other diseases of the circulatory system: Secondary | ICD-10-CM | POA: Insufficient documentation

## 2018-03-27 DIAGNOSIS — Z9049 Acquired absence of other specified parts of digestive tract: Secondary | ICD-10-CM | POA: Diagnosis not present

## 2018-03-27 DIAGNOSIS — J449 Chronic obstructive pulmonary disease, unspecified: Secondary | ICD-10-CM | POA: Insufficient documentation

## 2018-03-27 DIAGNOSIS — Z87891 Personal history of nicotine dependence: Secondary | ICD-10-CM | POA: Insufficient documentation

## 2018-03-27 DIAGNOSIS — E559 Vitamin D deficiency, unspecified: Secondary | ICD-10-CM | POA: Diagnosis not present

## 2018-03-27 DIAGNOSIS — M25561 Pain in right knee: Secondary | ICD-10-CM

## 2018-03-27 MED ORDER — SODIUM HYALURONATE (VISCOSUP) 20 MG/2ML IX SOSY
2.0000 mL | PREFILLED_SYRINGE | Freq: Once | INTRA_ARTICULAR | Status: AC
  Administered 2018-03-27: 11:00:00 via INTRA_ARTICULAR

## 2018-03-27 MED ORDER — ROPIVACAINE HCL 2 MG/ML IJ SOLN
INTRAMUSCULAR | Status: AC
  Filled 2018-03-27: qty 10

## 2018-03-27 MED ORDER — LIDOCAINE HCL (PF) 1 % IJ SOLN
INTRAMUSCULAR | Status: AC
  Filled 2018-03-27: qty 5

## 2018-03-27 MED ORDER — ROPIVACAINE HCL 2 MG/ML IJ SOLN
4.0000 mL | Freq: Once | INTRAMUSCULAR | Status: AC
  Administered 2018-03-27: 10 mL via INTRA_ARTICULAR

## 2018-03-27 MED ORDER — LIDOCAINE HCL (PF) 1 % IJ SOLN
4.0000 mL | Freq: Once | INTRAMUSCULAR | Status: AC
  Administered 2018-03-27: 5 mL

## 2018-03-27 NOTE — Patient Instructions (Addendum)
____________________________________________________________________________________________  Blood Thinners  Recommended Time Interval Before and After Neuraxial Block or Catheter Removal  Drug (Generic) Brand Name Time Before Time After Comments  Abciximab Reopro 15 days 2 hours   Alteplase Activase 10 days 10 days   Apixaban Eliquis 3 days 6 hours   Aspirin > 325 mg Goody Powders/Excedrin 11 days  (Usually not stopped)  Aspirin ? 81 mg  7 days  (Usually not stopped)  Cholesterol Medication Lipitor 4 days    Cilostazol Pletal 3 days 5 hours   Clopidogrel Plavix 7-10 days 2 hours   Dabigatran Pradaxa 5 days 6 hours   Delteparin Fragmin 24 hours 4 hours   Dipyridamole + ASA Aggrenox 11days 2 hours   Enoxaparin  Lovenox 24 hours 4 hours   Eptifibatide Integrillin 8 hours 2 hours   Fish oil  4 days    Fondaparinux  Arixtra 72 hours 12 hours   Garlic supplements  7 days    Ginkgo biloba  36 hours    Ginseng  24 hours    Heparin (IV)  4 hours 2 hours   Heparin ([place])  12 hours 2 hours   Hydroxychloroquine Plaquenil 11 days    LMW Heparin  24 hours    LMWH  24 hours    NSAIDs  3 days  (Usually not stopped)  Prasugrel Effient 7-10 days 6 hours   Reteplase Retavase 10 days 10 days   Rivaroxaban Xarelto 3 days 6 hours   Streptokinase Streptase 10 days 10 days   Tenecteplase TNKase 10 days 10 days   Thrombolytics  10 days  10 days Avoid x 10 days after inj.  Ticagrelor Brilinta 5-7 days 6 hours   Ticlodipine Ticlid 10-14 days 2 hours   Tinzaparin Innohep 24 hours 4 hours   Tirofiban Aggrastat 8 hours 2 hours   Vitamin E  4 days    Warfarin Coumadin 5 days 2 hours   ____________________________________________________________________________________________  ____________________________________________________________________________________________  Preparing for your procedure (without sedation)  Instructions: . Oral Intake: Do not eat or drink anything for at least 3 hours  prior to your procedure. . Transportation: Unless otherwise stated by your physician, you may drive yourself after the procedure. . Blood Pressure Medicine: Take your blood pressure medicine with a sip of water the morning of the procedure. . Blood thinners: Notify our staff if you are taking any blood thinners. Depending on which one you take, there will be specific instructions on how and when to stop it. . Diabetics on insulin: Notify the staff so that you can be scheduled 1st case in the morning. If your diabetes requires high dose insulin, take only  of your normal insulin dose the morning of the procedure and notify the staff that you have done so. . Preventing infections: Shower with an antibacterial soap the morning of your procedure.  . Build-up your immune system: Take 1000 mg of Vitamin C with every meal (3 times a day) the day prior to your procedure. Marland Kitchen Antibiotics: Inform the staff if you have a condition or reason that requires you to take antibiotics before dental procedures. . Pregnancy: If you are pregnant, call and cancel the procedure. . Sickness: If you have a cold, fever, or any active infections, call and cancel the procedure. . Arrival: You must be in the facility at least 30 minutes prior to your scheduled procedure. . Children: Do not bring any children with you. . Dress appropriately: Bring dark clothing that you would not mind  if they get stained. . Valuables: Do not bring any jewelry or valuables.  Procedure appointments are reserved for interventional treatments only. Marland Kitchen No Prescription Refills. . No medication changes will be discussed during procedure appointments. . No disability issues will be discussed.  Reasons to call and reschedule or cancel your procedure: (Following these recommendations will minimize the risk of a serious complication.) . Surgeries: Avoid having procedures within 2 weeks of any surgery. (Avoid for 2 weeks before or after any  surgery). . Flu Shots: Avoid having procedures within 2 weeks of a flu shots or . (Avoid for 2 weeks before or after immunizations). . Barium: Avoid having a procedure within 7-10 days after having had a radiological study involving the use of radiological contrast. (Myelograms, Barium swallow or enema study). . Heart attacks: Avoid any elective procedures or surgeries for the initial 6 months after a "Myocardial Infarction" (Heart Attack). . Blood thinners: It is imperative that you stop these medications before procedures. Let us know if you if you take any blood thinner.  . Infection: Avoid procedures during or within two weeks of an infection (including chest colds or gastrointestinal problems). Symptoms associated with infections include: Localized redness, fever, chills, night sweats or profuse sweating, burning sensation when voiding, cough, congestion, stuffiness, runny nose, sore throat, diarrhea, nausea, vomiting, cold or Flu symptoms, recent or current infections. It is specially important if the infection is over the area that we intend to treat. Marland Kitchen Heart and lung problems: Symptoms that may suggest an active cardiopulmonary problem include: cough, chest pain, breathing difficulties or shortness of breath, dizziness, ankle swelling, uncontrolled high or unusually low blood pressure, and/or palpitations. If you are experiencing any of these symptoms, cancel your procedure and contact your primary care physician for an evaluation.  Remember:  Regular Business hours are:  Monday to Thursday 8:00 AM to 4:00 PM  Provider's Schedule: Milinda Pointer, MD:  Procedure days: Tuesday and Thursday 7:30 AM to 4:00 PM  Gillis Santa, MD:  Procedure days: Monday and Wednesday 7:30 AM to 4:00 PM ____________________________________________________________________________________________   ____________________________________________________________________________________________  Post-Procedure  Discharge Instructions  Instructions:  Apply ice: Fill a plastic sandwich bag with crushed ice. Cover it with a small towel and apply to injection site. Apply for 15 minutes then remove x 15 minutes. Repeat sequence on day of procedure, until you go to bed. The purpose is to minimize swelling and discomfort after procedure.  Apply heat: Apply heat to procedure site starting the day following the procedure. The purpose is to treat any soreness and discomfort from the procedure.  Food intake: Start with clear liquids (like water) and advance to regular food, as tolerated.   Physical activities: Keep activities to a minimum for the first 8 hours after the procedure.   Driving: If you have received any sedation, you are not allowed to drive for 24 hours after your procedure.  Blood thinner: Restart your blood thinner 6 hours after your procedure. (Only for those taking blood thinners)  Insulin: As soon as you can eat, you may resume your normal dosing schedule. (Only for those taking insulin)  Infection prevention: Keep procedure site clean and dry.  Post-procedure Pain Diary: Extremely important that this be done correctly and accurately. Recorded information will be used to determine the next step in treatment.  Pain evaluated is that of treated area only. Do not include pain from an untreated area.  Complete every hour, on the hour, for the initial 8 hours. Set an alarm  to help you do this part accurately.  Do not go to sleep and have it completed later. It will not be accurate.  Follow-up appointment: Keep your follow-up appointment after the procedure. Usually 2 weeks for most procedures. (6 weeks in the case of radiofrequency.) Bring you pain diary.   Expect:  From numbing medicine (AKA: Local Anesthetics): Numbness or decrease in pain.  Onset: Full effect within 15 minutes of injected.  Duration: It will depend on the type of local anesthetic used. On the average, 1 to 8 hours.    From steroids: Decrease in swelling or inflammation. Once inflammation is improved, relief of the pain will follow.  Onset of benefits: Depends on the amount of swelling present. The more swelling, the longer it will take for the benefits to be seen. In some cases, up to 10 days.  Duration: Steroids will stay in the system x 2 weeks. Duration of benefits will depend on multiple posibilities including persistent irritating factors.  Occasional side-effects: Facial flushing (red, warm cheeks) , cramps (if present, drink Gatorade and take over-the-counter Magnesium 450-500 mg once to twice a day).  From procedure: Some discomfort is to be expected once the numbing medicine wears off. This should be minimal if ice and heat are applied as instructed.  Call if:  You experience numbness and weakness that gets worse with time, as opposed to wearing off.  New onset bowel or bladder incontinence. (This applies to Spinal procedures only)  Emergency Numbers:  [facility] business hours (Monday - Thursday, 8:00 AM - 4:00 PM) (Friday, 9:00 AM - 12:00 Noon): [phone]  After hours: [phone] ____________________________________________________________________________________________

## 2018-03-28 NOTE — Progress Notes (Signed)
Call the patient and schedule him to return in 2 weeks for bilateral intra-articular Hyalgan No. 4. Remind him to stop the Plavix only 5 days prior to the procedure. Pt is Nicholas Reyes

## 2018-04-01 ENCOUNTER — Ambulatory Visit: Payer: Medicare HMO | Admitting: Pain Medicine

## 2018-04-01 ENCOUNTER — Encounter: Payer: Self-pay | Admitting: Family Medicine

## 2018-04-01 ENCOUNTER — Ambulatory Visit (INDEPENDENT_AMBULATORY_CARE_PROVIDER_SITE_OTHER): Payer: Medicare HMO | Admitting: Family Medicine

## 2018-04-01 VITALS — BP 132/72 | HR 90 | Temp 98.4°F | Ht 68.0 in | Wt 236.0 lb

## 2018-04-01 DIAGNOSIS — M1990 Unspecified osteoarthritis, unspecified site: Secondary | ICD-10-CM

## 2018-04-01 DIAGNOSIS — IMO0002 Reserved for concepts with insufficient information to code with codable children: Secondary | ICD-10-CM

## 2018-04-01 DIAGNOSIS — L089 Local infection of the skin and subcutaneous tissue, unspecified: Secondary | ICD-10-CM

## 2018-04-01 DIAGNOSIS — R229 Localized swelling, mass and lump, unspecified: Secondary | ICD-10-CM

## 2018-04-01 MED ORDER — MUPIROCIN 2 % EX OINT: 1.0000 "application " | TOPICAL_OINTMENT | Freq: Two times a day (BID) | CUTANEOUS | 0 refills | Status: DC

## 2018-04-01 MED ORDER — CEPHALEXIN 500 MG PO CAPS: 500.0000 mg | ORAL_CAPSULE | Freq: Four times a day (QID) | ORAL | 0 refills | Status: DC

## 2018-04-01 NOTE — Progress Notes (Signed)
Subjective:    Patient ID: Nicholas Reyes, male    DOB: 1956/03/15, 62 y.o.   MRN: [id number]  HPI  Presents to clinic c/o pain in left hand, sore behind right ear and a sore on right upper arm.  All 3 complaints of been happening for over a week.  Patient states he does have arthritis in multiple joints and his left hand has been aching most recently.  He does not take any sort of pain relieving medication at this time.  Patient states his left hand will feel stiff in the mornings and will slightly loosen up as day goes on.  Denies any known injury to left hand.  Patient states he noticed the sore area behind his right ear when he was taken shower, states skin feels cracked.  Patient is unsure of how long the area on right upper arm is been there, but states over the past week or so has become more itchy.  Denies fever or chills.  Denies any drainage from area behind ear or on right upper arm.   Patient Active Problem List   Diagnosis Date Noted  . Chronic L5-S1 lumbar pars defect (Left) 03/27/2018  . Grade 1 Anterolisthesis of L5 over S1 03/27/2018  . Chronic knee pain (Right) 03/27/2018  . Chronic knee pain (Left) 03/27/2018  . Chronic patellofemoral knee pain (Left) 03/27/2018  . History of nephrolithiasis 03/27/2018  . Right flank pain 02/13/2018  . Osteoarthritis of knee (Bilateral) (R>L) 01/28/2018  . Tricompartment osteoarthritis of knee (Right) 01/28/2018  . Osteoarthritis of hands (Bilateral) 01/28/2018  . Chronic anticoagulation (PLAVIX) 01/28/2018  . Chronic knee pain (Primary Area of Pain) (Bilateral) (R>L) 01/02/2018  . Chronic hand pain (Secondary Area of Pain) (Bilateral) (L>R) 01/02/2018  . Chronic pain syndrome 01/02/2018  . Long term current use of opiate analgesic 01/02/2018  . Pharmacologic therapy 01/02/2018  . Disorder of skeletal system 01/02/2018  . Problems influencing health status 01/02/2018  . Cognitive impairment 12/11/2017  . DM2 (diabetes  mellitus, type 2) ([place]) 12/04/2017  . Psoriasis 11/15/2017  . Skin sore 11/15/2017  . Diabetes mellitus without complication ([place]) 19/37/9024  . Vitamin D deficiency 11/15/2017  . Essential hypertension 06/11/2017  . Coronary artery disease involving native coronary artery of native heart without angina pectoris 06/11/2017  . Exercise-induced angina ([place]) 01/31/2016  . Hypertension 05/18/2014  . Edema 02/09/2014  . Mixed hyperlipidemia 04/02/2013  . HTN (hypertension) 04/02/2013  . Chest pain with high risk for cardiac etiology 12/12/2012  . Dyspnea 12/12/2012  . Infective otitis externa 09/25/2012  . Coronary atherosclerosis 09/02/2012  . Otalgia 09/02/2012  . Benign essential hypertension 08/02/2012  . Chronic airway obstruction, not elsewhere classified 08/02/2012  . Depressive disorder, not elsewhere classified 08/02/2012  . Family history of ischemic heart disease (IHD) 07/08/2012  . Family history of ischemic heart disease and other diseases of the circulatory system 07/08/2012  . Allergic rhinitis 07/05/2012  . Other psoriasis 07/05/2012  . Benign neoplasm of colon 09/22/2010  . Arthritis 06/09/2010  . Asthma 06/09/2010  . Diarrhea 06/09/2010  . Headaches due to old head injury 06/09/2010  . Type 2 diabetes mellitus without complications ([place]) 09/73/5329  . Abdominal pain 06/09/2010   Social History   Tobacco Use  . Smoking status: Former Research scientist (life sciences)  . Smokeless tobacco: Never Used  Substance Use Topics  . Alcohol use: No    Frequency: Never   Review of Systems   Constitutional: Negative for chills, fatigue and fever.  HENT: Negative for congestion, ear pain, sinus pain and sore throat.   Eyes: Negative.   Respiratory: Negative for cough, shortness of breath and wheezing.   Cardiovascular: Negative for chest pain, palpitations and leg swelling.  Gastrointestinal: Negative for abdominal pain, diarrhea, nausea and vomiting.  Genitourinary: Negative for dysuria,  frequency and urgency.  Musculoskeletal: Negative for arthralgias and myalgias.  Skin: Negative for color change, pallor and rash.  Neurological: Negative for syncope, light-headedness and headaches.  Psychiatric/Behavioral: The patient is not nervous/anxious.       Objective:   Physical Exam  Constitutional: He is oriented to person, place, and time. No distress.  HENT:  Head: Normocephalic and atraumatic.    Area of red, dry cracked skin behind right ear greater than the by red line on diagram.  Eyes: Conjunctivae and EOM are normal. No scleral icterus.  Neck: Neck supple. No tracheal deviation present.  Cardiovascular: Normal rate and regular rhythm.  Pulmonary/Chest: Effort normal. No respiratory distress.  Musculoskeletal: He exhibits tenderness (left hand, sore with opening and closing of hand. ROM of hand, fingers, wrist intact. No deformity.). He exhibits no edema.  Neurological: He is alert and oriented to person, place, and time.  Skin: Skin is warm and dry. No pallor.     Red raised area of skin on right upper extremity, with some scabbing on the top & surrounding skin redness.  It is approximately the size of a marble.   Psychiatric: He has a normal mood and affect. His behavior is normal.  Nursing note and vitals reviewed.     Vitals:   04/01/18 1037  BP: 132/72  Pulse: 90  Temp: 98.4 F (36.9 C)  SpO2: 90%   Assessment & Plan:   Skin infection behind right ear, lump on right upper extremity - patient will put mupirocin ointment behind the right ear and also on top of the scabbed area on right upper extremity.  Due to surrounding redness of skin patient will also take Keflex 4 times daily for 10 days.  Patient has upcoming appointment with dermatology next week already scheduled.  Osteoarthritis - pain in left hand is most likely arthritis.  Patient will take Tylenol as needed and also advised to try a BenGay or Aspercreme topical rub to reduce pain.  Keep  regularly scheduled follow-up with PCP as planned.  Return to clinic sooner if any issues arise.

## 2018-04-01 NOTE — Patient Instructions (Signed)
You can use tylenol 650 mg to 1000 mg 3 times per day for pain  Rub topical bengay or aspercreme on left hand to help pain

## 2018-04-03 ENCOUNTER — Encounter: Payer: Self-pay | Admitting: Internal Medicine

## 2018-04-03 ENCOUNTER — Ambulatory Visit: Admit: 2018-04-03 | Discharge: 2018-04-04 | Payer: MEDICARE

## 2018-04-03 DIAGNOSIS — L989 Disorder of the skin and subcutaneous tissue, unspecified: Principal | ICD-10-CM

## 2018-04-03 DIAGNOSIS — D485 Neoplasm of uncertain behavior of skin: Secondary | ICD-10-CM

## 2018-04-03 DIAGNOSIS — L409 Psoriasis, unspecified: Secondary | ICD-10-CM

## 2018-04-03 DIAGNOSIS — Z1283 Encounter for screening for malignant neoplasm of skin: Secondary | ICD-10-CM

## 2018-04-03 DIAGNOSIS — L281 Prurigo nodularis: Secondary | ICD-10-CM | POA: Insufficient documentation

## 2018-04-09 ENCOUNTER — Other Ambulatory Visit: Payer: Self-pay

## 2018-04-09 ENCOUNTER — Encounter: Payer: Self-pay | Admitting: Radiology

## 2018-04-09 ENCOUNTER — Emergency Department
Admission: EM | Admit: 2018-04-09 | Discharge: 2018-04-10 | Discharge status: Against Medical Advice | Payer: Medicare HMO | Attending: Emergency Medicine | Admitting: Emergency Medicine

## 2018-04-09 ENCOUNTER — Emergency Department: Payer: Medicare HMO

## 2018-04-09 DIAGNOSIS — Z7902 Long term (current) use of antithrombotics/antiplatelets: Secondary | ICD-10-CM | POA: Insufficient documentation

## 2018-04-09 DIAGNOSIS — E119 Type 2 diabetes mellitus without complications: Secondary | ICD-10-CM | POA: Diagnosis not present

## 2018-04-09 DIAGNOSIS — R109 Unspecified abdominal pain: Secondary | ICD-10-CM

## 2018-04-09 DIAGNOSIS — Z79899 Other long term (current) drug therapy: Secondary | ICD-10-CM | POA: Diagnosis not present

## 2018-04-09 DIAGNOSIS — Z87891 Personal history of nicotine dependence: Secondary | ICD-10-CM | POA: Diagnosis not present

## 2018-04-09 DIAGNOSIS — N281 Cyst of kidney, acquired: Secondary | ICD-10-CM | POA: Diagnosis not present

## 2018-04-09 DIAGNOSIS — I10 Essential (primary) hypertension: Secondary | ICD-10-CM | POA: Insufficient documentation

## 2018-04-09 DIAGNOSIS — Z7984 Long term (current) use of oral hypoglycemic drugs: Secondary | ICD-10-CM | POA: Insufficient documentation

## 2018-04-09 LAB — CBC WITH DIFFERENTIAL/PLATELET
Abs Immature Granulocytes: 0.01 10*3/uL (ref 0.00–0.07)
Basophils Absolute: 0 10*3/uL (ref 0.0–0.1)
Basophils Relative: 1 %
Eosinophils Absolute: 0.2 10*3/uL (ref 0.0–0.5)
Eosinophils Relative: 3 %
HCT: 46.1 % (ref 39.0–52.0)
Hemoglobin: 14.2 g/dL (ref 13.0–17.0)
Immature Granulocytes: 0 %
LYMPHS ABS: 1.9 10*3/uL (ref 0.7–4.0)
Lymphocytes Relative: 33 %
MCH: 29.6 pg (ref 26.0–34.0)
MCHC: 30.8 g/dL (ref 30.0–36.0)
MCV: 96.2 fL (ref 80.0–100.0)
Monocytes Absolute: 0.4 10*3/uL (ref 0.1–1.0)
Monocytes Relative: 8 %
Neutro Abs: 3.1 10*3/uL (ref 1.7–7.7)
Neutrophils Relative %: 55 %
Platelets: 142 10*3/uL — ABNORMAL LOW (ref 150–400)
RBC: 4.79 MIL/uL (ref 4.22–5.81)
RDW: 14.9 % (ref 11.5–15.5)
WBC: 5.6 10*3/uL (ref 4.0–10.5)
nRBC: 0 % (ref 0.0–0.2)

## 2018-04-09 LAB — COMPREHENSIVE METABOLIC PANEL
ALK PHOS: 67 U/L (ref 38–126)
ALT: 13 U/L (ref 0–44)
AST: 17 U/L (ref 15–41)
Albumin: 4.3 g/dL (ref 3.5–5.0)
Anion gap: 7 (ref 5–15)
BILIRUBIN TOTAL: 0.5 mg/dL (ref 0.3–1.2)
BUN: 10 mg/dL (ref 8–23)
CALCIUM: 9.1 mg/dL (ref 8.9–10.3)
CO2: 33 mmol/L — ABNORMAL HIGH (ref 22–32)
CREATININE: 0.73 mg/dL (ref 0.61–1.24)
Chloride: 100 mmol/L (ref 98–111)
Glucose, Bld: 93 mg/dL (ref 70–99)
Potassium: 3.9 mmol/L (ref 3.5–5.1)
Sodium: 140 mmol/L (ref 135–145)
TOTAL PROTEIN: 7.8 g/dL (ref 6.5–8.1)

## 2018-04-09 LAB — URINALYSIS, COMPLETE (UACMP) WITH MICROSCOPIC
Bacteria, UA: NONE SEEN
Bilirubin Urine: NEGATIVE
Glucose, UA: NEGATIVE mg/dL
Hgb urine dipstick: NEGATIVE
KETONES UR: NEGATIVE mg/dL
Leukocytes, UA: NEGATIVE
Nitrite: NEGATIVE
Protein, ur: NEGATIVE mg/dL
Specific Gravity, Urine: 1.021 (ref 1.005–1.030)
Squamous Epithelial / HPF: NONE SEEN (ref 0–5)
pH: 5 (ref 5.0–8.0)

## 2018-04-09 LAB — LIPASE, BLOOD: LIPASE: 34 U/L (ref 11–51)

## 2018-04-09 MED ORDER — MORPHINE SULFATE (PF) 4 MG/ML IV SOLN
4.0000 mg | Freq: Once | INTRAVENOUS | Status: AC
  Administered 2018-04-09: 4 mg via INTRAVENOUS
  Filled 2018-04-09: qty 1

## 2018-04-09 MED ORDER — OXYCODONE-ACETAMINOPHEN 5-325 MG PO TABS: 1.0000 | ORAL_TABLET | ORAL | 0 refills | Status: DC | PRN

## 2018-04-09 MED ORDER — IOPAMIDOL (ISOVUE-300) INJECTION 61%
30.0000 mL | Freq: Once | INTRAVENOUS | Status: AC
  Administered 2018-04-09: 30 mL via ORAL

## 2018-04-09 MED ORDER — IOHEXOL 300 MG/ML  SOLN
100.0000 mL | Freq: Once | INTRAMUSCULAR | Status: AC | PRN
  Administered 2018-04-09: 100 mL via INTRAVENOUS

## 2018-04-09 MED ORDER — ONDANSETRON HCL 4 MG/2ML IJ SOLN
4.0000 mg | Freq: Once | INTRAMUSCULAR | Status: AC
  Administered 2018-04-09: 4 mg via INTRAVENOUS
  Filled 2018-04-09: qty 2

## 2018-04-09 NOTE — ED Triage Notes (Signed)
Pt in with mid abd pain that started last week, no hx of the same. STates no n.v.d or dysuria.

## 2018-04-09 NOTE — ED Notes (Signed)
Patient back from CT.

## 2018-04-09 NOTE — ED Notes (Signed)
Pt is going to medical imaging.   

## 2018-04-09 NOTE — ED Provider Notes (Addendum)
[facility] Emergency Department Provider Note   ____________________________________________   First MD Initiated Contact with Patient 04/09/18 2145     (approximate)  I have reviewed the triage vital signs and the nursing notes.   HISTORY  Chief Complaint Abdominal Pain   HPI Nicholas Reyes is a 61 y.o. male patient complains of pain in the mid abdomen starting the last week.  He has no fever no chills no nausea vomiting diarrhea pain is worse when he eats worse when he moves worse with palpation or percussion to deep achy pain.  Moderately severe.  Constant  Past Medical History:  Diagnosis Date  . Arthritis   . CAD (coronary artery disease)   . COPD (chronic obstructive pulmonary disease) ([place])   . Depression   . Diabetes mellitus   . Heart disease    cad with 3 stents   . Hypertension   . MI (myocardial infarction) ([place])   . Migraines   . Psoriasis    joint pain in knees left hand     Patient Active Problem List   Diagnosis Date Noted  . 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 Chronic L5-S1 lumbar pars defect (Left) 03/27/2018  . Grade 1 Anterolisthesis of L5 over S1 03/27/2018  . Chronic knee pain (Right) 03/27/2018  . Chronic knee pain (Left) 03/27/2018  . Chronic patellofemoral knee pain (Left) 03/27/2018  . History of nephrolithiasis 03/27/2018  . Right flank pain 02/13/2018  . Osteoarthritis of knee (Bilateral) (R>L) 01/28/2018  . Tricompartment osteoarthritis of knee (Right) 01/28/2018  . Osteoarthritis of hands (Bilateral) 01/28/2018  . Chronic anticoagulation (PLAVIX) 01/28/2018  . Chronic knee pain (Primary Area of Pain) (Bilateral) (R>L) 01/02/2018  . Chronic hand pain (Secondary Area of Pain) (Bilateral) (L>R) 01/02/2018  . Chronic pain syndrome 01/02/2018  . Long term current use of opiate analgesic 01/02/2018  . Pharmacologic therapy 01/02/2018  . Disorder of skeletal system 01/02/2018  . Problems influencing health status  01/02/2018  . Cognitive impairment 12/11/2017  . DM2 (diabetes mellitus, type 2) ([place]) 12/04/2017  . Psoriasis 11/15/2017  . Skin sore 11/15/2017  . Diabetes mellitus without complication ([place]) 81/85/6314  . Vitamin D deficiency 11/15/2017  . Essential hypertension 06/11/2017  . Coronary artery disease involving native coronary artery of native heart without angina pectoris 06/11/2017  . Exercise-induced angina ([place]) 01/31/2016  . Hypertension 05/18/2014  . Edema 02/09/2014  . Mixed hyperlipidemia 04/02/2013  . HTN (hypertension) 04/02/2013  . Chest pain with high risk for cardiac etiology 12/12/2012  . Dyspnea 12/12/2012  . Infective otitis externa 09/25/2012  . Coronary atherosclerosis 09/02/2012  . Otalgia 09/02/2012  . Benign essential hypertension 08/02/2012  . Chronic airway obstruction, not elsewhere classified 08/02/2012  . Depressive disorder, not elsewhere classified 08/02/2012  . Family history of ischemic heart disease (IHD) 07/08/2012  . Family history of ischemic heart disease and other diseases of the circulatory system 07/08/2012  . Allergic rhinitis 07/05/2012  . Other psoriasis 07/05/2012  . Benign neoplasm of colon 09/22/2010  . Arthritis 06/09/2010  . Asthma 06/09/2010  . Diarrhea 06/09/2010  . Headaches due to old head injury 06/09/2010  . Type 2 diabetes mellitus without complications ([place]) 97/06/6376  . Abdominal pain 06/09/2010    Past Surgical History:  Procedure Laterality Date  . APPENDECTOMY    . CATARACT EXTRACTION Bilateral    inserted lens b/l eyes right 03/12/17, left 05/28/17   . CHOLECYSTECTOMY    . CORONARY ANGIOPLASTY WITH STENT PLACEMENT  LAD, LCX-OM, ? 3rd location  . EYE SURGERY     cataract b/l   . HEMORRHOID SURGERY    . hemorrhoid surgery      x 2   . KNEE SURGERY     right knee arthroscopy, partial synovectecomy 04/21/15 and meniscectomy/chondroplasty   . LEFT HEART CATH AND CORONARY ANGIOGRAPHY Left 01/30/2018   Procedure:  LEFT HEART CATH AND CORONARY ANGIOGRAPHY;  Surgeon: Isaias Cowman, MD;  Location: [facility];  Service: Cardiovascular;  Laterality: Left;  . right knee surgery     ? type of procedure   . STENT PLACEMENT VASCULAR ([place] HX)    . TONSILLECTOMY    . TONSILLECTOMY AND ADENOIDECTOMY      Prior to Admission medications   Medication Sig Start Date End Date Taking? Authorizing Provider  albuterol (PROVENTIL HFA;VENTOLIN HFA) 108 (90 BASE) MCG/ACT inhaler Inhale 2 puffs into the lungs every 4 (four) hours as needed. For shortness of breath    [provider]  amLODipine (NORVASC) 5 MG tablet Take 5 mg by mouth daily.    [provider]  aspirin 81 MG EC tablet Take 81 mg by mouth daily.     [provider]  atorvastatin (LIPITOR) 20 MG tablet Take 20 mg by mouth daily.    [provider]  budesonide-formoterol (SYMBICORT) 80-4.5 MCG/ACT inhaler Inhale 2 puffs into the lungs 2 (two) times daily.    [provider]  cephALEXin (KEFLEX) 500 MG capsule Take 1 capsule (500 mg total) by mouth 4 (four) times daily. 04/01/18   Jodelle Green, FNP  cetirizine (ZYRTEC) 10 MG tablet Take 10 mg by mouth daily.    [provider]  Cholecalciferol 50000 units capsule Take 1 capsule (50,000 Units total) by mouth once a week. 12/10/17   McLean-Scocuzza, Nino Glow, MD  clopidogrel (PLAVIX) 75 MG tablet Take 75 mg by mouth daily.    [provider]  diclofenac (FLECTOR) 1.3 % PTCH Place 1 patch onto the skin 2 (two) times daily. 11/30/17   McLean-Scocuzza, Nino Glow, MD  doxycycline (VIBRA-TABS) 100 MG tablet Take 1 tablet (100 mg total) by mouth 2 (two) times daily. With food 12/04/17   McLean-Scocuzza, Nino Glow, MD  DULoxetine (CYMBALTA) 60 MG capsule Take 1 capsule (60 mg total) by mouth daily. 01/11/18   McLean-Scocuzza, Nino Glow, MD  furosemide (LASIX) 40 MG tablet Take 40 mg by mouth daily.    [provider]  hydrOXYzine  (ATARAX/VISTARIL) 25 MG tablet Take 1 tablet (25 mg total) by mouth 3 (three) times daily as needed. 11/28/17   Triplett, Johnette Abraham B, FNP  isosorbide mononitrate (IMDUR) 30 MG 24 hr tablet Take 1 tablet (30 mg total) by mouth daily. 01/11/18   McLean-Scocuzza, Nino Glow, MD  ketorolac (ACULAR) 0.5 % ophthalmic solution  12/03/17   [provider]  meloxicam (MOBIC) 7.5 MG tablet Take 1 tablet (7.5 mg total) by mouth 2 (two) times daily as needed for pain. 12/31/17   McLean-Scocuzza, Nino Glow, MD  metFORMIN (GLUCOPHAGE) 500 MG tablet Take 1 tablet (500 mg total) by mouth daily with breakfast. 11/23/17   McLean-Scocuzza, Nino Glow, MD  metoprolol tartrate (LOPRESSOR) 25 MG tablet Take 25 mg by mouth 2 (two) times daily.    [provider]  montelukast (SINGULAIR) 10 MG tablet Take 10 mg by mouth at bedtime.    [provider]  mupirocin ointment (BACTROBAN) 2 % Apply to affected area 3 times daily 11/18/17 11/18/18  Sable Feil, PA-C  mupirocin ointment (BACTROBAN) 2 % Place 1 application into the nose 2 (two) times daily. Apply to area behind right ear and on right upper arm 04/01/18   Guse, Jacquelynn Cree, FNP  nitroGLYCERIN (NITROSTAT) 0.4 MG SL tablet Place 0.4 mg under the tongue every 5 (five) minutes as needed. For chest pain    [provider]  ondansetron (ZOFRAN) 4 MG tablet Take 1 tablet (4 mg total) by mouth every 8 (eight) hours as needed for nausea or vomiting. 12/19/17   Guse, Jacquelynn Cree, FNP  pantoprazole (PROTONIX) 20 MG tablet Take 1 tablet (20 mg total) by mouth daily. 02/18/18   Burnard Hawthorne, FNP  prednisoLONE acetate (PRED FORTE) 1 % ophthalmic suspension  11/30/17   [provider]  ranitidine (ZANTAC) 150 MG/10ML syrup 150 mg 2 (two) times daily. Taking 10 ml bid    [provider]  Secukinumab (COSENTYX 300 DOSE [place]) Inject into the skin every 30 (thirty) days.    [provider]  spironolactone (ALDACTONE) 25 MG tablet Take 25 mg by  mouth daily.    [provider]  sucralfate (CARAFATE) 1 g tablet Take 1 tablet (1 g total) by mouth 4 (four) times daily -  with meals and at bedtime. 02/24/18 02/24/19  Merlyn Lot, MD  triamcinolone cream (KENALOG) 0.1 % Apply 1 application topically 2 (two) times daily. Not face, groin, underarms 11/15/17   McLean-Scocuzza, Nino Glow, MD    Allergies Patient has no known allergies.  Family History  Problem Relation Age of Onset  . Cancer Mother   . Heart disease Father   . Stroke Father     Social History Social History   Tobacco Use  . Smoking status: Former Research scientist (life sciences)  . Smokeless tobacco: Never Used  Substance Use Topics  . Alcohol use: No    Frequency: Never  . Drug use: No    Review of Systems  Constitutional: No fever/chills Eyes: No visual changes. ENT: No sore throat. Cardiovascular: Denies chest pain. Respiratory: Denies shortness of breath. Gastrointestinal:  abdominal pain.  No nausea, no vomiting.  No diarrhea.  No constipation. Genitourinary: Negative for dysuria. Musculoskeletal: Negative for back pain. Skin: Negative for rash. Neurological: Negative for headaches, focal weakness   ____________________________________________   PHYSICAL EXAM:  VITAL SIGNS: ED Triage Vitals  Enc Vitals Group     BP 04/09/18 2050 (!) 141/84     Pulse Rate 04/09/18 2050 90     Resp 04/09/18 2050 20     Temp 04/09/18 2050 98.1 F (36.7 C)     Temp Source 04/09/18 2050 Oral     SpO2 04/09/18 2050 95 %     Weight 04/09/18 2051 240 lb (108.9 kg)     Height 04/09/18 2051 5\' 7"  (1.702 m)     Head Circumference --      Peak Flow --      Pain Score 04/09/18 2051 8     Pain Loc --      Pain Edu? --      Excl. in [place]? --    Constitutional: Alert and oriented.  Does not look horribly uncomfortable Eyes: Conjunctivae are normal.  Head: Atraumatic. Nose: No congestion/rhinnorhea. Mouth/Throat: Mucous membranes are moist.  Oropharynx non-erythematous. Neck:  No stridor. Cardiovascular: Normal rate, regular rhythm. Grossly normal heart sounds.  Good peripheral circulation. Respiratory: Normal respiratory effort.  No retractions. Lungs CTAB. Gastrointestinal: Soft tender to palpation percussion especially in the middle of  the abdomen above and below the umbilicus. No distention. No abdominal bruits. No CVA tenderness. Musculoskeletal: No lower extremity tenderness  edema.   Neurologic:  Normal speech and language. No gross focal neurologic deficits are appreciated.  Skin:  Skin is warm, dry and intact. No rash noted.   ____________________________________________   LABS (all labs ordered are listed, but only abnormal results are displayed)  Labs Reviewed  COMPREHENSIVE METABOLIC PANEL - Abnormal; Notable for the following components:      Result Value   CO2 33 (*)    All other components within normal limits  URINALYSIS, COMPLETE (UACMP) WITH MICROSCOPIC - Abnormal; Notable for the following components:   Color, Urine YELLOW (*)    APPearance CLEAR (*)    All other components within normal limits  CBC WITH DIFFERENTIAL/PLATELET - Abnormal; Notable for the following components:   Platelets 142 (*)    All other components within normal limits  LIPASE, BLOOD   ____________________________________________  EKG   ____________________________________________  RADIOLOGY  ED MD interpretation: CT read by radiology as possible small bowel obstruction enteritis ileus etc.  Patient does have fairly severe by belly pain.  Official radiology report(s): Ct Abdomen Pelvis W Contrast  Result Date: 04/09/2018 CLINICAL DATA:  62 y/o M; mid abdominal pain that started last week. EXAM: CT ABDOMEN AND PELVIS WITH CONTRAST TECHNIQUE: Multidetector CT imaging of the abdomen and pelvis was performed using the standard protocol following bolus administration of intravenous contrast. CONTRAST:  123mL OMNIPAQUE IOHEXOL 300 MG/ML  SOLN COMPARISON:   02/24/2018 CT abdomen and pelvis. FINDINGS: Lower chest: Coronary artery calcific atherosclerosis. Hepatobiliary: No focal liver abnormality is seen. Status post cholecystectomy. No biliary dilatation. Pancreas: Unremarkable. No pancreatic ductal dilatation or surrounding inflammatory changes. Spleen: Normal in size without focal abnormality. Adrenals/Urinary Tract: Normal adrenal glands. Multiple stable renal cysts bilaterally. Kidneys are otherwise unremarkable. No urinary stone disease or hydronephrosis. Normal bladder. Stomach/Bowel: Normal appearance of the stomach and colon. Appendectomy. Mildly dilated loop of jejunum with wall thickening. The small bowel downstream is decompressed and unremarkable. Sigmoid diverticulosis without findings of acute diverticulitis. Vascular/Lymphatic: Aortic atherosclerosis. No enlarged abdominal or pelvic lymph nodes. Reproductive: Central prostatic calcifications. Other: No abdominal wall hernia or abnormality. No abdominopelvic ascites. Musculoskeletal: No fracture is seen. IMPRESSION: 1. Mildly dilated loop of jejunum with wall thickening possibly representing enteritis, dysmotility, or partial obstruction. 2. Coronary artery and aortic calcific atherosclerosis. Electronically Signed   By: Kristine Garbe M.D.   On: 04/09/2018 22:33    ____________________________________________   PROCEDURES  Procedure(s) performed:   Procedures  Critical Care performed: Critical care time half an hour this includes examining the patient discussing the patient with hospitalist surgeon reviewing the films.  ____________________________________________   INITIAL IMPRESSION / ASSESSMENT AND PLAN / ED COURSE  We will consult with surgery and hospitalist.  Plan on getting the patient in the hospital for observation.    Clinical Course as of Apr 10 2247  Tue Apr 09, 2018  2247 RBC / HPF: 0-5 [PM]    Clinical Course User Index [PM] Nena Polio, MD    Medicine will admit surgery will consult surgery is aware  ____________________________________________   FINAL CLINICAL IMPRESSION(S) / ED DIAGNOSES  Final diagnoses:  Abdominal pain, unspecified abdominal location   Actual diagnosis is possible small bowel obstruction  ED Discharge Orders    None       Note:  This document was prepared using Dragon voice recognition software and may include unintentional dictation  errors.    Nena Polio, MD 04/09/18 2249    Nena Polio, MD 04/09/18 2255 Patient reports he cannot stay in the hospital as he has a dog and cat at home and there is no one to take care of them.  He promises to come back if he is worse.  He will come back again tomorrow for recheck.   Nena Polio, MD 04/09/18 2308

## 2018-04-09 NOTE — Plan of Care (Signed)
Contacted by ED to make Surgery aware that this patient is being admitted to the Hospitalist service; they do not have any acute surgical concerns requiring emergent evaluation, but requested that we follow up in the AM.

## 2018-04-09 NOTE — Discharge Instructions (Signed)
You do have a dilated loop of bowel in your stomach.  You may have a partial small bowel obstruction.  It would be much safer for you if you could stay in the hospital.  Since you can I will give you some pain medication.  I want you to come back here if you have worse pain fever vomiting or feel sicker.  I want you can come back here tomorrow for recheck no matter what.  If you can get someone to watch her dog and cat we will take another look at you and see if the possible obstruction is improved that would be great if not we will put you in the hospital as we plan to do today.

## 2018-04-10 ENCOUNTER — Encounter: Payer: Self-pay | Admitting: Internal Medicine

## 2018-04-10 ENCOUNTER — Ambulatory Visit (INDEPENDENT_AMBULATORY_CARE_PROVIDER_SITE_OTHER): Payer: Medicare HMO | Admitting: Internal Medicine

## 2018-04-10 VITALS — BP 144/76 | HR 91 | Temp 97.8°F | Ht 67.0 in | Wt 240.0 lb

## 2018-04-10 DIAGNOSIS — K529 Noninfective gastroenteritis and colitis, unspecified: Secondary | ICD-10-CM

## 2018-04-10 DIAGNOSIS — L409 Psoriasis, unspecified: Secondary | ICD-10-CM

## 2018-04-10 MED ORDER — CLOBETASOL PROPIONATE 0.05 % EX OINT: 1.0000 "application " | TOPICAL_OINTMENT | Freq: Two times a day (BID) | CUTANEOUS | 0 refills | Status: AC

## 2018-04-10 MED ORDER — MUPIROCIN 2 % EX OINT: 1.0000 "application " | TOPICAL_OINTMENT | Freq: Two times a day (BID) | CUTANEOUS | 2 refills | Status: DC

## 2018-04-10 NOTE — Patient Instructions (Addendum)
Bactroban and clobetasol cream behind right ear until better no more than 2 weeks   04/10/2018 Telephone [facility]  [place], [place] [address]  [phone]  Deneen Harts, LPN  biopsy results     06/04/2018 Office Visit Dermatology Jamelle Rushing, MD  [address]  [address]  [place], [place] [address]  [phone]  [phone] (Fax)       Psoriasis Psoriasis is a long-term (chronic) condition of skin inflammation. It occurs because your immune system causes skin cells to form too quickly. As a result, too many skin cells grow and create raised, red patches (plaques) that look silvery on your skin. Plaques may appear anywhere on your body. They can be any size or shape. Psoriasis can come and go. The condition varies from mild to very severe. It cannot be passed from one person to another (not contagious). What are the causes? The cause of psoriasis is not known, but certain factors can make the condition worse. These include:  Damage or trauma to the skin, such as cuts, scrapes, sunburn, and dryness.  Lack of sunlight.  Certain medicines.  Alcohol.  Tobacco use.  Stress.  Infections caused by bacteria or viruses.  What increases the risk? This condition is more likely to develop in:  People with a family history of psoriasis.  People who are Caucasian.  People who are between the ages of 15-73 and 65-38 years old.  What are the signs or symptoms? There are five different types of psoriasis. You can have more than one type of psoriasis during your life. Types are:  Plaque.  Guttate.  Inverse.  Pustular.  Erythrodermic.  Each type of psoriasis has different symptoms.  Plaque psoriasis symptoms include red, raised plaques with a silvery white coating (scale). These plaques may be itchy. Your nails may be pitted and crumbly or fall off.  Guttate psoriasis symptoms include small red spots that  often show up on your trunk, arms, and legs. These spots may develop after you have been sick, especially with strep throat.  Inverse psoriasis symptoms include plaques in your underarm area, under your breasts, or on your genitals, groin, or buttocks.  Pustular psoriasis symptoms include pus-filled bumps that are painful, red, and swollen on the palms of your hands or the soles of your feet. You also may feel exhausted, feverish, weak, or have no appetite.  Erythrodermic psoriasis symptoms include bright red skin that may look burned. You may have a fast heartbeat and a body temperature that is too high or too low. You may be itchy or in pain.  How is this diagnosed? Your health care provider may suspect psoriasis based on your symptoms and family history. Your health care provider will also do a physical exam. This may include a procedure to remove a tissue sample (biopsy) for testing. You may also be referred to a health care provider who specializes in skin diseases (dermatologist). How is this treated? There is no cure for this condition, but treatment can help manage it. Goals of treatment include:  Helping your skin heal.  Reducing itching and inflammation.  Slowing the growth of new skin cells.  Helping your immune system respond better to your skin.  Treatment varies, depending on the severity of your condition. Treatment may include:  Creams or ointments.  Ultraviolet ray exposure (light therapy). This may include natural sunlight or light therapy in a medical office.  Medicines (systemic therapy). These medicines  can help your body better manage skin cell turnover and inflammation. They may be used along with light therapy or ointments. You may also get antibiotic medicines if you have an infection.  Follow these instructions at home: [place] your skin as needed. Only use moisturizers that have been approved by your health care provider.  Apply cool  compresses to the affected areas.  Do not scratch your skin. Lifestyle   Do not use tobacco products. This includes cigarettes, chewing tobacco, and e-cigarettes. If you need help quitting, ask your health care provider.  Drink little or no alcohol.  Try techniques for stress reduction, such as meditation or yoga.  Get exposure to the sun as told by your health care provider. Do not get sunburned.  Consider joining a psoriasis support group. Medicines  Take or use over-the-counter and prescription medicines only as told by your health care provider.  If you were prescribed an antibiotic, take or use it as told by your health care provider. Do not stop taking the antibiotic even if your condition starts to improve. General instructions  Keep a journal to help track what triggers an outbreak. Try to avoid any triggers.  See a counselor or social worker if feelings of sadness, frustration, and hopelessness about your condition are interfering with your work and relationships.  Keep all follow-up visits as told by your health care provider. This is important. Contact a health care provider if:  Your pain gets worse.  You have increasing redness or warmth in the affected areas.  You have new or worsening pain or stiffness in your joints.  Your nails start to break easily or pull away from the nail bed.  You have a fever.  You feel depressed. This information is not intended to replace advice given to you by your health care provider. Make sure you discuss any questions you have with your health care provider. Document Released: 04/21/2000 Document Revised: 09/30/2015 Document Reviewed: 09/09/2014 Elsevier Interactive Patient Education  2018 [employer].

## 2018-04-10 NOTE — ED Notes (Signed)
Pt given a cup of soda and crackers

## 2018-04-10 NOTE — Progress Notes (Signed)
No chief complaint on file.  F/u  1. CT 04/09/18 +enteritis vs partial bowel obstruction vs dysmotility he was having abdominal pain, he is having bowel movements not diarrhea, able to pass gas, and reports fever at times. Denies nausea  2. C/o behind right ear pain and scale and keflex did not help. He has dermatology appt 06/04/2018 he had biopsies with them and they tried to call him today    Review of Systems  Constitutional: Negative for fever.  Respiratory: Negative for shortness of breath.   Cardiovascular: Negative for chest pain.  Gastrointestinal: Positive for abdominal pain. Negative for constipation, diarrhea, nausea and vomiting.  Skin:       +right ear lesion     Past Medical History:  Diagnosis Date  . Arthritis   . CAD (coronary artery disease)   . COPD (chronic obstructive pulmonary disease) ([place])   . Depression   . Diabetes mellitus   . Heart disease    cad with 3 stents   . Hypertension   . MI (myocardial infarction) ([place])   . Migraines   . Psoriasis    joint pain in knees left hand    Past Surgical History:  Procedure Laterality Date  . APPENDECTOMY    . CATARACT EXTRACTION Bilateral    inserted lens b/l eyes right 03/12/17, left 05/28/17   . CHOLECYSTECTOMY    . CORONARY ANGIOPLASTY WITH STENT PLACEMENT     LAD, LCX-OM, ? 3rd location  . EYE SURGERY     cataract b/l   . HEMORRHOID SURGERY    . hemorrhoid surgery      x 2   . KNEE SURGERY     right knee arthroscopy, partial synovectecomy 04/21/15 and meniscectomy/chondroplasty   . LEFT HEART CATH AND CORONARY ANGIOGRAPHY Left 01/30/2018   Procedure: LEFT HEART CATH AND CORONARY ANGIOGRAPHY;  Surgeon: Isaias Cowman, MD;  Location: [facility];  Service: Cardiovascular;  Laterality: Left;  . right knee surgery     ? type of procedure   . STENT PLACEMENT VASCULAR ([place] HX)    . TONSILLECTOMY    . TONSILLECTOMY AND ADENOIDECTOMY     Family History  Problem Relation Age of Onset  .  Cancer Mother   . Heart disease Father   . Stroke Father    Social History   Socioeconomic History  . Marital status: Single    Spouse name: Not on file  . Number of children: 0  . Years of education: Not on file  . Highest education level: Not on file  Occupational History  . Not on file  Social Needs  . Financial resource strain: Not on file  . Food insecurity:    Worry: Not on file    Inability: Not on file  . Transportation needs:    Medical: Not on file    Non-medical: Not on file  Tobacco Use  . Smoking status: Former Research scientist (life sciences)  . Smokeless tobacco: Never Used  Substance and Sexual Activity  . Alcohol use: No    Frequency: Never  . Drug use: No  . Sexual activity: Not on file  Lifestyle  . Physical activity:    Days per week: Not on file    Minutes per session: Not on file  . Stress: Not on file  Relationships  . Social connections:    Talks on phone: Not on file    Gets together: Not on file    Attends religious service: Not on file  Active member of club or organization: Not on file    Attends meetings of clubs or organizations: Not on file    Relationship status: Not on file  . Intimate partner violence:    Fear of current or ex partner: Not on file    Emotionally abused: Not on file    Physically abused: Not on file    Forced sexual activity: Not on file  Other Topics Concern  . Not on file  Social History Narrative   Former smoker quit 2013/2014 used to smoke<1 ppd x 2-3 years    No kids   Single not sexually active    Unable to read/write    Has dogs and cats at home    No guns   Wears seat belt    No outpatient medications have been marked as taking for the 04/10/18 encounter (Appointment) with McLean-Scocuzza, Nino Glow, MD.   No Known Allergies Recent Results (from the past 2160 hour(s))  Glucose, capillary     Status: Abnormal   Collection Time: 01/30/18  7:46 AM  Result Value Ref Range   Glucose-Capillary 102 (H) 70 - 99 mg/dL  CBC with  Differential/Platelet     Status: Abnormal   Collection Time: 02/13/18 10:42 AM  Result Value Ref Range   WBC 4.9 4.0 - 10.5 K/uL   RBC 4.47 4.22 - 5.81 Mil/uL   Hemoglobin 13.7 13.0 - 17.0 g/dL   HCT 41.9 39.0 - 52.0 %   MCV 93.7 78.0 - 100.0 fl   MCHC 32.8 30.0 - 36.0 g/dL   RDW 15.8 (H) 11.5 - 15.5 %   Platelets 152.0 150.0 - 400.0 K/uL   Neutrophils Relative % 48.3 43.0 - 77.0 %   Lymphocytes Relative 38.8 12.0 - 46.0 %   Monocytes Relative 9.9 3.0 - 12.0 %   Eosinophils Relative 2.1 0.0 - 5.0 %   Basophils Relative 0.9 0.0 - 3.0 %   Neutro Abs 2.4 1.4 - 7.7 K/uL   Lymphs Abs 1.9 0.7 - 4.0 K/uL   Monocytes Absolute 0.5 0.1 - 1.0 K/uL   Eosinophils Absolute 0.1 0.0 - 0.7 K/uL   Basophils Absolute 0.0 0.0 - 0.1 K/uL  Comprehensive metabolic panel     Status: Abnormal   Collection Time: 02/13/18 10:42 AM  Result Value Ref Range   Sodium 139 135 - 145 mEq/L   Potassium 4.1 3.5 - 5.1 mEq/L   Chloride 99 96 - 112 mEq/L   CO2 35 (H) 19 - 32 mEq/L   Glucose, Bld 104 (H) 70 - 99 mg/dL   BUN 11 6 - 23 mg/dL   Creatinine, Ser 0.86 0.40 - 1.50 mg/dL   Total Bilirubin 0.3 0.2 - 1.2 mg/dL   Alkaline Phosphatase 56 39 - 117 U/L   AST 13 0 - 37 U/L   ALT 12 0 - 53 U/L   Total Protein 7.6 6.0 - 8.3 g/dL   Albumin 4.2 3.5 - 5.2 g/dL   Calcium 9.6 8.4 - 10.5 mg/dL   GFR 96.03 >60.00 mL/min  Lipase     Status: None   Collection Time: 02/13/18 10:42 AM  Result Value Ref Range   Lipase 34.0 11.0 - 59.0 U/L  I-STAT creatinine     Status: None   Collection Time: 02/13/18 12:57 PM  Result Value Ref Range   Creatinine, Ser 0.80 0.61 - 1.24 mg/dL  Lipase, blood     Status: None   Collection Time: 02/24/18  2:33 AM  Result  Value Ref Range   Lipase 32 11 - 51 U/L    Comment: Performed at [facility], [address] [place], [place] [address]  Comprehensive metabolic panel     Status: Abnormal   Collection Time: 02/24/18  2:33 AM  Result Value Ref Range   Sodium 140 135 - 145  mmol/L   Potassium 3.8 3.5 - 5.1 mmol/L   Chloride 100 98 - 111 mmol/L   CO2 34 (H) 22 - 32 mmol/L   Glucose, Bld 131 (H) 70 - 99 mg/dL   BUN 19 8 - 23 mg/dL   Creatinine, Ser 0.84 0.61 - 1.24 mg/dL   Calcium 8.9 8.9 - 10.3 mg/dL   Total Protein 7.5 6.5 - 8.1 g/dL   Albumin 4.1 3.5 - 5.0 g/dL   AST 16 15 - 41 U/L   ALT 13 0 - 44 U/L   Alkaline Phosphatase 60 38 - 126 U/L   Total Bilirubin 0.5 0.3 - 1.2 mg/dL   GFR calc non Af Amer >60 >60 mL/min   GFR calc Af Amer >60 >60 mL/min    Comment: (NOTE) The eGFR has been calculated using the CKD EPI equation. This calculation has not been validated in all clinical situations. eGFR's persistently <60 mL/min signify possible Chronic Kidney Disease.    Anion gap 6 5 - 15    Comment: Performed at [facility], [address] [place], [place] [address]  CBC     Status: Abnormal   Collection Time: 02/24/18  2:33 AM  Result Value Ref Range   WBC 6.6 4.0 - 10.5 K/uL   RBC 4.56 4.22 - 5.81 MIL/uL   Hemoglobin 13.5 13.0 - 17.0 g/dL   HCT 44.4 39.0 - 52.0 %   MCV 97.4 80.0 - 100.0 fL   MCH 29.6 26.0 - 34.0 pg   MCHC 30.4 30.0 - 36.0 g/dL   RDW 14.6 11.5 - 15.5 %   Platelets 144 (L) 150 - 400 K/uL   nRBC 0.0 0.0 - 0.2 %    Comment: Performed at [facility], [address] [place], [place] [address]  Urinalysis, Complete w Microscopic     Status: Abnormal   Collection Time: 02/24/18  2:33 AM  Result Value Ref Range   Color, Urine AMBER (A) YELLOW    Comment: BIOCHEMICALS MAY BE AFFECTED BY COLOR   APPearance CLOUDY (A) CLEAR   Specific Gravity, Urine 1.041 (H) 1.005 - 1.030   pH 5.0 5.0 - 8.0   Glucose, UA NEGATIVE NEGATIVE mg/dL   Hgb urine dipstick NEGATIVE NEGATIVE   Bilirubin Urine SMALL (A) NEGATIVE   Ketones, ur 5 (A) NEGATIVE mg/dL   Protein, ur 30 (A) NEGATIVE mg/dL   Nitrite NEGATIVE NEGATIVE   Leukocytes, UA NEGATIVE NEGATIVE   WBC, UA NONE SEEN 0 - 5 WBC/hpf   Bacteria, UA NONE SEEN NONE SEEN    Squamous Epithelial / LPF NONE SEEN 0 - 5   Mucus PRESENT    Hyaline Casts, UA PRESENT    Ca Oxalate Crys, UA PRESENT     Comment: Performed at [facility], [address], [place], [place] [address]  Comprehensive metabolic panel     Status: Abnormal   Collection Time: 04/09/18  8:52 PM  Result Value Ref Range   Sodium 140 135 - 145 mmol/L   Potassium 3.9 3.5 - 5.1 mmol/L   Chloride 100 98 - 111 mmol/L   CO2 33 (H) 22 - 32 mmol/L  Glucose, Bld 93 70 - 99 mg/dL   BUN 10 8 - 23 mg/dL   Creatinine, Ser 0.73 0.61 - 1.24 mg/dL   Calcium 9.1 8.9 - 10.3 mg/dL   Total Protein 7.8 6.5 - 8.1 g/dL   Albumin 4.3 3.5 - 5.0 g/dL   AST 17 15 - 41 U/L   ALT 13 0 - 44 U/L   Alkaline Phosphatase 67 38 - 126 U/L   Total Bilirubin 0.5 0.3 - 1.2 mg/dL   GFR calc non Af Amer >60 >60 mL/min   GFR calc Af Amer >60 >60 mL/min   Anion gap 7 5 - 15    Comment: Performed at [facility], [address] [place], [place] [address]  Lipase, blood     Status: None   Collection Time: 04/09/18  8:52 PM  Result Value Ref Range   Lipase 34 11 - 51 U/L    Comment: Performed at [facility], [address] [place], [place] [address]  Urinalysis, Complete w Microscopic     Status: Abnormal   Collection Time: 04/09/18  9:05 PM  Result Value Ref Range   Color, Urine YELLOW (A) YELLOW   APPearance CLEAR (A) CLEAR   Specific Gravity, Urine 1.021 1.005 - 1.030   pH 5.0 5.0 - 8.0   Glucose, UA NEGATIVE NEGATIVE mg/dL   Hgb urine dipstick NEGATIVE NEGATIVE   Bilirubin Urine NEGATIVE NEGATIVE   Ketones, ur NEGATIVE NEGATIVE mg/dL   Protein, ur NEGATIVE NEGATIVE mg/dL   Nitrite NEGATIVE NEGATIVE   Leukocytes, UA NEGATIVE NEGATIVE   RBC / HPF 0-5 0 - 5 RBC/hpf   WBC, UA 0-5 0 - 5 WBC/hpf   Bacteria, UA NONE SEEN NONE SEEN   Squamous Epithelial / LPF NONE SEEN 0 - 5   Mucus PRESENT     Comment: Performed at [facility], [address] [place], [place] [address]  CBC  with Differential     Status: Abnormal   Collection Time: 04/09/18  9:07 PM  Result Value Ref Range   WBC 5.6 4.0 - 10.5 K/uL   RBC 4.79 4.22 - 5.81 MIL/uL   Hemoglobin 14.2 13.0 - 17.0 g/dL   HCT 46.1 39.0 - 52.0 %   MCV 96.2 80.0 - 100.0 fL   MCH 29.6 26.0 - 34.0 pg   MCHC 30.8 30.0 - 36.0 g/dL   RDW 14.9 11.5 - 15.5 %   Platelets 142 (L) 150 - 400 K/uL   nRBC 0.0 0.0 - 0.2 %   Neutrophils Relative % 55 %   Neutro Abs 3.1 1.7 - 7.7 K/uL   Lymphocytes Relative 33 %   Lymphs Abs 1.9 0.7 - 4.0 K/uL   Monocytes Relative 8 %   Monocytes Absolute 0.4 0.1 - 1.0 K/uL   Eosinophils Relative 3 %   Eosinophils Absolute 0.2 0.0 - 0.5 K/uL   Basophils Relative 1 %   Basophils Absolute 0.0 0.0 - 0.1 K/uL   Immature Granulocytes 0 %   Abs Immature Granulocytes 0.01 0.00 - 0.07 K/uL    Comment: Performed at [facility], [address] [place], [place] [address]   Objective  There is no height or weight on file to calculate BMI. Wt Readings from Last 3 Encounters:  04/09/18 240 lb (108.9 kg)  04/01/18 236 lb (107 kg)  02/24/18 240 lb (108.9 kg)   Temp Readings from Last 3 Encounters:  04/09/18 98.1 F (36.7 C) (Oral)  04/01/18 98.4 F (  36.9 C) (Oral)  03/27/18 98.5 F (36.9 C)   BP Readings from Last 3 Encounters:  04/09/18 (!) 141/76  04/01/18 132/72  03/27/18 132/83   Pulse Readings from Last 3 Encounters:  04/09/18 76  04/01/18 90  03/27/18 86    Physical Exam  Constitutional: He is oriented to person, place, and time. Vital signs are normal. He appears well-developed and well-nourished. He is cooperative.  HENT:  Head: Normocephalic and atraumatic.  Mouth/Throat: Oropharynx is clear and moist and mucous membranes are normal.  Eyes: Pupils are equal, round, and reactive to light. Conjunctivae are normal.  Cardiovascular: Normal rate, regular rhythm and normal heart sounds.  Pulmonary/Chest: Effort normal and breath sounds normal.  Abdominal: Soft. Bowel  sounds are normal. There is tenderness in the periumbilical area.  Neurological: He is alert and oriented to person, place, and time. Gait normal.  Skin: Skin is warm, dry and intact.  Psychiatric: He has a normal mood and affect. His speech is normal and behavior is normal. Judgment and thought content normal. Cognition and memory are normal.  Nursing note and vitals reviewed.   Assessment   1. Enteritis vs partial bowel obstruction vs dysmotility likely cause of abdominal pain  2. Psoriasis right ear posterior  3. HM Plan   1. GI path panel, stool culture and C diff  Given copy of CT appt Dr. Vicente Males 04/10/18 may need colonoscopy in future  2. Temovate and bactroban  Will ask [facility] dermatology if they are going to reconsider Cosentyx?   3.  Flu shot had 02/22/18  Tdap had 06/20/15 or 12/07/15 per prior PCP notes Adacel also had 08/18/14, 05/13/15, 12/07/15 pna 23 had 09/19/12   Check with PCP other vaccines I.e shingrix and colonoscopy, labs,notes I.e cardiology notes if had  rec hep B vaccine at f/u hep C (negative) 11/20/16  Immune MMR, and vitamin D (low) PSA had 11/15/17 normal  Former smoker quit 2013/2014 smoked <1 ppd x  2-3 years  Urine had 09/24/17 normal  HIV neg 09/15/14  Derm appt sch 06/04/2018 pt to call and get bx report of [facility] bxs today they attempted to call     Provider: Dr. Olivia Mackie McLean-Scocuzza-Internal Medicine

## 2018-04-10 NOTE — Progress Notes (Signed)
Note has been sent  

## 2018-04-10 NOTE — ED Notes (Signed)
Pt was informed that he had to wait additional two hours before he could leave, due to the fact he received morphine at 2203 and he drove himself to the ED. Pt agreed to stay.

## 2018-04-11 ENCOUNTER — Ambulatory Visit (INDEPENDENT_AMBULATORY_CARE_PROVIDER_SITE_OTHER): Payer: Medicare HMO | Admitting: Gastroenterology

## 2018-04-11 ENCOUNTER — Encounter: Payer: Self-pay | Admitting: Gastroenterology

## 2018-04-11 ENCOUNTER — Other Ambulatory Visit: Payer: Self-pay

## 2018-04-11 VITALS — BP 113/62 | HR 83 | Ht 67.0 in | Wt 236.8 lb

## 2018-04-11 DIAGNOSIS — R933 Abnormal findings on diagnostic imaging of other parts of digestive tract: Secondary | ICD-10-CM

## 2018-04-11 DIAGNOSIS — R1013 Epigastric pain: Secondary | ICD-10-CM | POA: Diagnosis not present

## 2018-04-11 MED ORDER — OMEPRAZOLE 40 MG PO CPDR: 40.0000 mg | DELAYED_RELEASE_CAPSULE | Freq: Every day | ORAL | 3 refills | Status: DC

## 2018-04-11 NOTE — Progress Notes (Signed)
Nicholas Bellows MD, MRCP(U.K) [address]  [place]  [place], [place] [address]  Main: [phone]  Fax: [phone]   Gastroenterology Consultation  Referring Provider:     Burnard Hawthorne, FNP Primary Care Physician:  McLean-Scocuzza, Nino Glow, MD Primary Gastroenterologist:  Dr. Jonathon Reyes  Reason for Consultation:     ER follow up         HPI:   Nicholas Reyes is a 62 y.o. y/o male referred for consultation & management  by Dr. Terese Door, Nino Glow, MD.  Summary of history :  He here today to see me for an emergency room follow-up.  He presented to the emergency room on 04/09/2018 with abdominal pain of one-week duration.  CT scan of the abdomen was performed which showed a mildly dilated loop of jejunum with wall thickening possibly representing enteritis/dysmotility/partial obstruction.  It is unclear what happened subsequently but I cannot see any other inpatient notes but it appears that he was discharged.  04/09/2018 hemoglobin 14.2 platelet count of 142.  Lipase was 34.  CMP normal.  Urine analysis negative.  Abdominal pain: Onset: ongoing for a few months , getting worse , every day , each episode 2-3 hours ,  Site :central abdominal  Radiation: localized  Severity :10/10  Nature of pain: sharp  Aggravating factors:  Nothing  Relieving factors :nothing  Weight loss: no  NSAID use: none  PPI use :no  Gall bladder surgery: taken out  Frequency of bowel movements: every day 1-2 times daily  Change in bowel movements: no - cant recall results of his last colonoscopy - he cant recall when he was told to return Relief with bowel movements: for a few minutes  Gas/Bloating/Abdominal distension: no     Past Medical History:  Diagnosis Date  . Arthritis   . CAD (coronary artery disease)   . COPD (chronic obstructive pulmonary disease) ([place])   . Depression   . Diabetes mellitus   . Heart disease    cad with 3 stents   . Hypertension   . MI (myocardial  infarction) ([place])   . Migraines   . Psoriasis    joint pain in knees left hand     Past Surgical History:  Procedure Laterality Date  . APPENDECTOMY    . CATARACT EXTRACTION Bilateral    inserted lens b/l eyes right 03/12/17, left 05/28/17   . CHOLECYSTECTOMY    . CORONARY ANGIOPLASTY WITH STENT PLACEMENT     LAD, LCX-OM, ? 3rd location  . EYE SURGERY     cataract b/l   . HEMORRHOID SURGERY    . hemorrhoid surgery      x 2   . KNEE SURGERY     right knee arthroscopy, partial synovectecomy 04/21/15 and meniscectomy/chondroplasty   . LEFT HEART CATH AND CORONARY ANGIOGRAPHY Left 01/30/2018   Procedure: LEFT HEART CATH AND CORONARY ANGIOGRAPHY;  Surgeon: Isaias Cowman, MD;  Location: [facility];  Service: Cardiovascular;  Laterality: Left;  . right knee surgery     ? type of procedure   . STENT PLACEMENT VASCULAR ([place] HX)    . TONSILLECTOMY    . TONSILLECTOMY AND ADENOIDECTOMY      Prior to Admission medications   Medication Sig Start Date End Date Taking? Authorizing Provider  albuterol (PROVENTIL HFA;VENTOLIN HFA) 108 (90 BASE) MCG/ACT inhaler Inhale 2 puffs into the lungs every 4 (four) hours as needed. For shortness of breath   Yes [provider]  amLODipine (NORVASC) 5 MG tablet Take 5 mg by mouth daily.   Yes [provider]  aspirin 81 MG EC tablet Take 81 mg by mouth daily.    Yes [provider]  atorvastatin (LIPITOR) 20 MG tablet Take 20 mg by mouth daily.   Yes [provider]  budesonide-formoterol (SYMBICORT) 80-4.5 MCG/ACT inhaler Inhale 2 puffs into the lungs 2 (two) times daily.   Yes [provider]  cephALEXin (KEFLEX) 500 MG capsule Take 1 capsule (500 mg total) by mouth 4 (four) times daily. 04/01/18  Yes Guse, Jacquelynn Cree, FNP  cetirizine (ZYRTEC) 10 MG tablet Take 10 mg by mouth daily.   Yes [provider]  Cholecalciferol 50000 units capsule Take 1 capsule (50,000 Units total) by mouth once  a week. 12/10/17  Yes McLean-Scocuzza, Nino Glow, MD  clobetasol ointment (TEMOVATE) 3.87 % Apply 1 application topically 2 (two) times daily. Right ear as needed not face or private 04/10/18  Yes McLean-Scocuzza, Nino Glow, MD  clopidogrel (PLAVIX) 75 MG tablet Take 75 mg by mouth daily.   Yes [provider]  diclofenac (FLECTOR) 1.3 % PTCH Place 1 patch onto the skin 2 (two) times daily. 11/30/17  Yes McLean-Scocuzza, Nino Glow, MD  doxycycline (VIBRA-TABS) 100 MG tablet Take 1 tablet (100 mg total) by mouth 2 (two) times daily. With food 12/04/17  Yes McLean-Scocuzza, Nino Glow, MD  DULoxetine (CYMBALTA) 60 MG capsule Take 1 capsule (60 mg total) by mouth daily. 01/11/18  Yes McLean-Scocuzza, Nino Glow, MD  furosemide (LASIX) 40 MG tablet Take 40 mg by mouth daily.   Yes [provider]  hydrOXYzine (ATARAX/VISTARIL) 25 MG tablet Take 1 tablet (25 mg total) by mouth 3 (three) times daily as needed. 11/28/17  Yes Triplett, Cari B, FNP  isosorbide mononitrate (IMDUR) 30 MG 24 hr tablet Take 1 tablet (30 mg total) by mouth daily. 01/11/18  Yes McLean-Scocuzza, Nino Glow, MD  ketorolac (ACULAR) 0.5 % ophthalmic solution  12/03/17  Yes [provider]  meloxicam (MOBIC) 7.5 MG tablet Take 1 tablet (7.5 mg total) by mouth 2 (two) times daily as needed for pain. 12/31/17  Yes McLean-Scocuzza, Nino Glow, MD  metFORMIN (GLUCOPHAGE) 500 MG tablet Take 1 tablet (500 mg total) by mouth daily with breakfast. 11/23/17  Yes McLean-Scocuzza, Nino Glow, MD  metoprolol tartrate (LOPRESSOR) 25 MG tablet Take 25 mg by mouth 2 (two) times daily.   Yes [provider]  montelukast (SINGULAIR) 10 MG tablet Take 10 mg by mouth at bedtime.   Yes [provider]  mupirocin ointment (BACTROBAN) 2 % Apply 1 application topically 2 (two) times daily. Right ear 04/10/18  Yes McLean-Scocuzza, Nino Glow, MD  nitroGLYCERIN (NITROSTAT) 0.4 MG SL tablet Place 0.4 mg under the tongue every 5 (five) minutes as needed.  For chest pain   Yes [provider]  ondansetron (ZOFRAN) 4 MG tablet Take 1 tablet (4 mg total) by mouth every 8 (eight) hours as needed for nausea or vomiting. 12/19/17  Yes Guse, Jacquelynn Cree, FNP  oxyCODONE-acetaminophen (PERCOCET) 5-325 MG tablet Take 1 tablet by mouth every 4 (four) hours as needed for severe pain. 04/09/18 04/09/19 Yes Nena Polio, MD  pantoprazole (PROTONIX) 20 MG tablet Take 1 tablet (20 mg total) by mouth daily. 02/18/18  Yes Burnard Hawthorne, FNP  prednisoLONE acetate (PRED FORTE) 1 % ophthalmic suspension  11/30/17  Yes [provider]  Secukinumab (COSENTYX 300 DOSE [place]) Inject into the skin every 30 (  thirty) days.   Yes [provider]  spironolactone (ALDACTONE) 25 MG tablet Take 25 mg by mouth daily.   Yes [provider]  triamcinolone cream (KENALOG) 0.1 % Apply 1 application topically 2 (two) times daily. Not face, groin, underarms 11/15/17  Yes McLean-Scocuzza, Nino Glow, MD  ranitidine (ZANTAC) 150 MG/10ML syrup 150 mg 2 (two) times daily. Taking 10 ml bid    [provider]  sucralfate (CARAFATE) 1 g tablet Take 1 tablet (1 g total) by mouth 4 (four) times daily -  with meals and at bedtime. Patient not taking: Reported on 04/11/2018 02/24/18 02/24/19  Merlyn Lot, MD    Family History  Problem Relation Age of Onset  . Cancer Mother   . Heart disease Father   . Stroke Father      Social History   Tobacco Use  . Smoking status: Former Research scientist (life sciences)  . Smokeless tobacco: Never Used  Substance Use Topics  . Alcohol use: No    Frequency: Never  . Drug use: No    Allergies as of 04/11/2018  . (No Known Allergies)    Review of Systems:    All systems reviewed and negative except where noted in HPI.   Physical Exam:  BP 113/62   Pulse 83   Ht 5\' 7"  (1.702 m)   Wt 236 lb 12.8 oz (107.4 kg)   BMI 37.09 kg/m  No LMP for male patient. Psych:  Alert and cooperative. Normal mood and affect. General:    Alert,  Well-developed, well-nourished, pleasant and cooperative in NAD Head:  Normocephalic and atraumatic. Eyes:  Sclera clear, no icterus.   Conjunctiva pink. Ears:  Normal auditory acuity. Nose:  No deformity, discharge, or lesions. Mouth:  No deformity or lesions,oropharynx pink & moist. Neck:  Supple; no masses or thyromegaly. Lungs:  Respirations even and unlabored.  Clear throughout to auscultation.   No wheezes, crackles, or rhonchi. No acute distress. Heart:  Regular rate and rhythm; no murmurs, clicks, rubs, or gallops. Abdomen:  Normal bowel sounds.  No bruits.  Soft, mild central abdominal pain ,  non-distended without masses, hepatosplenomegaly or hernias noted.  No guarding or rebound tenderness.    Neurologic:  Alert and oriented x3;  grossly normal neurologically. Skin:  Intact without significant lesions or rashes. No jaundice. Lymph Nodes:  No significant cervical adenopathy. Psych:  Alert and cooperative. Normal mood and affect.  Imaging Studies: Ct Abdomen Pelvis W Contrast  Result Date: 04/09/2018 CLINICAL DATA:  62 y/o M; mid abdominal pain that started last week. EXAM: CT ABDOMEN AND PELVIS WITH CONTRAST TECHNIQUE: Multidetector CT imaging of the abdomen and pelvis was performed using the standard protocol following bolus administration of intravenous contrast. CONTRAST:  156mL OMNIPAQUE IOHEXOL 300 MG/ML  SOLN COMPARISON:  02/24/2018 CT abdomen and pelvis. FINDINGS: Lower chest: Coronary artery calcific atherosclerosis. Hepatobiliary: No focal liver abnormality is seen. Status post cholecystectomy. No biliary dilatation. Pancreas: Unremarkable. No pancreatic ductal dilatation or surrounding inflammatory changes. Spleen: Normal in size without focal abnormality. Adrenals/Urinary Tract: Normal adrenal glands. Multiple stable renal cysts bilaterally. Kidneys are otherwise unremarkable. No urinary stone disease or hydronephrosis. Normal bladder. Stomach/Bowel: Normal appearance  of the stomach and colon. Appendectomy. Mildly dilated loop of jejunum with wall thickening. The small bowel downstream is decompressed and unremarkable. Sigmoid diverticulosis without findings of acute diverticulitis. Vascular/Lymphatic: Aortic atherosclerosis. No enlarged abdominal or pelvic lymph nodes. Reproductive: Central prostatic calcifications. Other: No abdominal wall hernia or abnormality. No abdominopelvic ascites. Musculoskeletal: No  fracture is seen. IMPRESSION: 1. Mildly dilated loop of jejunum with wall thickening possibly representing enteritis, dysmotility, or partial obstruction. 2. Coronary artery and aortic calcific atherosclerosis. Electronically Signed   By: Kristine Garbe M.D.   On: 04/09/2018 22:33    Assessment and Plan:   REMIEL CORTI is a 62 y.o. y/o male has been referred for abdominal pain. Recent ER visit and CT abdomen showed features of possible enteritis. I will obtain MR enterogram to rule out any signs of Crohns disease/persistent inflammation.    Plan  1. MR enterogram 2. Omeprazole 40 mg once daily  3. After MRE if negative  then will need EGD+colonoscopy , on plavix will need cardiac clearance  4. H pylori breath    Follow up in 6 weeks   Dr Nicholas Bellows MD,MRCP(U.K)

## 2018-04-14 NOTE — Progress Notes (Deleted)
Patient's Name: Nicholas Reyes  MRN: [id number]  Referring Provider: Orland Reyes *  DOB: June 16, 1955  PCP: McLean-Scocuzza, Nino Glow, MD  DOS: 04/15/2018  Note by: Gaspar Cola, MD  Service setting: Ambulatory outpatient  Specialty: Interventional Pain Management  Patient type: Established  Location: [facility] (AMB) Pain Management Facility  Visit type: Interventional Procedure   Primary Reason for Visit: Interventional Pain Management Treatment. CC: No chief complaint on file.  Procedure:          Anesthesia, Analgesia, Anxiolysis:  Type: Therapeutic Intra-Articular Hyalgan Knee Injection #4  Region: Lateral infrapatellar Knee Region Level: Knee Joint Laterality: Bilateral  Type: Local Anesthesia Indication(s): Analgesia         Local Anesthetic: Lidocaine 1-2% Route: Infiltration ([place]/IM) IV Access: Declined Sedation: Declined   Position: Sitting   Indications: 1. 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 Chronic knee pain (Primary Area of Pain) (Bilateral) (R>L)   3. Chronic knee pain (Right)   4. Chronic knee pain (Left)   5. Tricompartment osteoarthritis of knee (Right)    Pain Score: Pre-procedure:  /10 Post-procedure:  /10  Post-Procedure Assessment  03/27/2018 Procedure: *** Pre-procedure pain score:  0/10 Post-procedure pain score: 0/10 (100% relief) Influential Factors: BMI:   Intra-procedural challenges: None observed.         Assessment challenges: None detected.              Reported side-effects: None.        Post-procedural adverse reactions or complications: None reported         Sedation: No sedation used. When no sedatives are used, the analgesic levels obtained are directly associated to the effectiveness of the local anesthetics. However, when sedation is provided, the level of analgesia obtained during the initial 1 hour following the intervention, is believed to be the result of a combination of factors. These factors may include, but are  not limited to: 1. The effectiveness of the local anesthetics used. 2. The effects of the analgesic(s) and/or anxiolytic(s) used. 3. The degree of discomfort experienced by the patient at the time of the procedure. 4. The patients ability and reliability in recalling and recording the events. 5. The presence and influence of possible secondary gains and/or psychosocial factors. Reported result: Relief experienced during the 1st hour after the procedure:   (Ultra-Short Term Relief)            Interpretative annotation: Clinically appropriate result. Analgesia during this period is likely to be Local Anesthetic and/or IV Sedative (Analgesic/Anxiolytic) related.          Effects of local anesthetic: The analgesic effects attained during this period are directly associated to the localized infiltration of local anesthetics and therefore cary significant diagnostic value as to the etiological location, or anatomical origin, of the pain. Expected duration of relief is directly dependent on the pharmacodynamics of the local anesthetic used. Long-acting (4-6 hours) anesthetics used.  Reported result: Relief during the next 4 to 6 hour after the procedure:   (Short-Term Relief)            Interpretative annotation: Clinically appropriate result. Analgesia during this period is likely to be Local Anesthetic-related.          Long-term benefit: Defined as the period of time past the expected duration of local anesthetics (1 hour for short-acting and 4-6 hours for long-acting). With the possible exception of prolonged sympathetic blockade from the local anesthetics, benefits during this period are typically attributed to, or associated  with, other factors such as analgesic sensory neuropraxia, antiinflammatory effects, or beneficial biochemical changes provided by agents other than the local anesthetics.  Reported result: Extended relief following procedure:   (Long-Term Relief)            Interpretative  annotation: Clinically possible results. Good relief. No permanent benefit expected. Inflammation plays a part in the etiology to the pain.          Current benefits: Defined as reported results that persistent at this point in time.   Analgesia: *** %            Function: Somewhat improved ROM: Somewhat improved Interpretative annotation: Recurrence of symptoms. No permanent benefit expected. Effective diagnostic intervention.          Interpretation: Results would suggest a successful diagnostic intervention.                  Plan:  Please see "Plan of Care" for details.                Pre-op Assessment:  [name] is a 62 y.o. (year old), male patient, seen today for interventional treatment. He  has a past surgical history that includes Cataract extraction (Bilateral); Cholecystectomy; Appendectomy; Tonsillectomy; STENT PLACEMENT VASCULAR ([facility] HX); right knee surgery; Tonsillectomy and adenoidectomy; hemorrhoid surgery ; Eye surgery; Coronary angioplasty with stent; Hemorrhoid surgery; Knee surgery; and LEFT HEART CATH AND CORONARY ANGIOGRAPHY (Left, 01/30/2018). [name] has a current medication list which includes the following prescription(s): albuterol, amlodipine, aspirin, atorvastatin, budesonide-formoterol, cephalexin, cetirizine, cholecalciferol, clobetasol ointment, clopidogrel, diclofenac, doxycycline, duloxetine, furosemide, hydroxyzine, isosorbide mononitrate, ketorolac, meloxicam, metformin, metoprolol tartrate, montelukast, mupirocin ointment, nitroglycerin, omeprazole, ondansetron, oxycodone-acetaminophen, pantoprazole, prednisolone acetate, ranitidine, secukinumab, spironolactone, sucralfate, and triamcinolone cream. His primarily concern today is the No chief complaint on file.  Initial Vital Signs:  Pulse/HCG Rate:    Temp:   Resp:   BP:   SpO2:    BMI: Estimated body mass index is 37.09 kg/m as calculated from the following:   Height as of 04/11/18: 5\' 7"  (1.702 m).    Weight as of 04/11/18: 236 lb 12.8 oz (107.4 kg).  Risk Assessment: Allergies: Reviewed. He has No Known Allergies.  Allergy Precautions: None required Coagulopathies: Reviewed. None identified.  Blood-thinner therapy: None at this time Active Infection(s): Reviewed. None identified. Mr. Nham is afebrile  Site Confirmation: Mr. Raucci was asked to confirm the procedure and laterality before marking the site Procedure checklist: Completed Consent: Before the procedure and under the influence of no sedative(s), amnesic(s), or anxiolytics, the patient was informed of the treatment options, risks and possible complications. To fulfill our ethical and legal obligations, as recommended by the American Medical Association's Code of Ethics, I have informed the patient of my clinical impression; the nature and purpose of the treatment or procedure; the risks, benefits, and possible complications of the intervention; the alternatives, including doing nothing; the risk(s) and benefit(s) of the alternative treatment(s) or procedure(s); and the risk(s) and benefit(s) of doing nothing. The patient was provided information about the general risks and possible complications associated with the procedure. These may include, but are not limited to: failure to achieve desired goals, infection, bleeding, organ or nerve damage, allergic reactions, paralysis, and death. In addition, the patient was informed of those risks and complications associated to the procedure, such as failure to decrease pain; infection; bleeding; organ or nerve damage with subsequent damage to sensory, motor, and/or autonomic systems, resulting in permanent pain, numbness, and/or weakness of  one or several areas of the body; allergic reactions; (i.e.: anaphylactic reaction); and/or death. Furthermore, the patient was informed of those risks and complications associated with the medications. These include, but are not limited to: allergic  reactions (i.e.: anaphylactic or anaphylactoid reaction(s)); adrenal axis suppression; blood sugar elevation that in diabetics may result in ketoacidosis or comma; water retention that in patients with history of congestive heart failure may result in shortness of breath, pulmonary edema, and decompensation with resultant heart failure; weight gain; swelling or edema; medication-induced neural toxicity; particulate matter embolism and blood vessel occlusion with resultant organ, and/or nervous system infarction; and/or aseptic necrosis of one or more joints. Finally, the patient was informed that Medicine is not an exact science; therefore, there is also the possibility of unforeseen or unpredictable risks and/or possible complications that may result in a catastrophic outcome. The patient indicated having understood very clearly. We have given the patient no guarantees and we have made no promises. Enough time was given to the patient to ask questions, all of which were answered to the patient's satisfaction. Mr. Mcnair has indicated that he wanted to continue with the procedure. Attestation: I, the ordering provider, attest that I have discussed with the patient the benefits, risks, side-effects, alternatives, likelihood of achieving goals, and potential problems during recovery for the procedure that I have provided informed consent. Date  Time: {CHL [facility]-PAIN TIME CHOICES:[id number]}  Pre-Procedure Preparation:  Monitoring: As per clinic protocol. Respiration, ETCO2, SpO2, BP, heart rate and rhythm monitor placed and checked for adequate function Safety Precautions: Patient was assessed for positional comfort and pressure points before starting the procedure. Time-out: I initiated and conducted the "Time-out" before starting the procedure, as per protocol. The patient was asked to participate by confirming the accuracy of the "Time Out" information. Verification of the correct person, site, and procedure  were performed and confirmed by me, the nursing staff, and the patient. "Time-out" conducted as per Joint Commission's Universal Protocol (UP.01.01.01). Time:    Description of Procedure:          Target Area: Knee Joint Approach: Just above the Lateral tibial plateau, lateral to the infrapatellar tendon. Area Prepped: Entire knee area, from the mid-thigh to the mid-shin. Prepping solution: ChloraPrep (2% chlorhexidine gluconate and 70% isopropyl alcohol) Safety Precautions: Aspiration looking for blood return was conducted prior to all injections. At no point did we inject any substances, as a needle was being advanced. No attempts were made at seeking any paresthesias. Safe injection practices and needle disposal techniques used. Medications properly checked for expiration dates. SDV (single dose vial) medications used. Description of the Procedure: Protocol guidelines were followed. The patient was placed in position over the fluoroscopy table. The target area was identified and the area prepped in the usual manner. Skin & deeper tissues infiltrated with local anesthetic. Appropriate amount of time allowed to pass for local anesthetics to take effect. The procedure needles were then advanced to the target area. Proper needle placement secured. Negative aspiration confirmed. Solution injected in intermittent fashion, asking for systemic symptoms every 0.5cc of injectate. The needles were then removed and the area cleansed, making sure to leave some of the prepping solution back to take advantage of its long term bactericidal properties. There were no vitals filed for this visit.  Start Time:   hrs. End Time:   hrs. Materials:  Needle(s) Type: Regular needle Gauge: 25G Length: 1.5-in Medication(s): Please see orders for medications and dosing details.  Imaging Guidance:  Type of Imaging Technique: None used Indication(s): N/A Exposure Time: No patient exposure Contrast: None  used. Fluoroscopic Guidance: N/A Ultrasound Guidance: N/A Interpretation: N/A  Antibiotic Prophylaxis:   Anti-infectives (From admission, onward)   None     Indication(s): None identified  Post-operative Assessment:  Post-procedure Vital Signs:  Pulse/HCG Rate:    Temp:   Resp:   BP:   SpO2:    EBL: None  Complications: No immediate post-treatment complications observed by team, or reported by patient.  Note: The patient tolerated the entire procedure well. A repeat set of vitals were taken after the procedure and the patient was kept under observation following institutional policy, for this type of procedure. Post-procedural neurological assessment was performed, showing return to baseline, prior to discharge. The patient was provided with post-procedure discharge instructions, including a section on how to identify potential problems. Should any problems arise concerning this procedure, the patient was given instructions to immediately contact us, at any time, without hesitation. In any case, we plan to contact the patient by telephone for a follow-up status report regarding this interventional procedure.  Comments:  No additional relevant information.  Plan of Care   Imaging Orders  No imaging studies ordered today   Procedure Orders    No procedure(s) ordered today    Medications ordered for procedure: No orders of the defined types were placed in this encounter.  Medications administered: Haskell Riling had no medications administered during this visit.  See the medical record for exact dosing, route, and time of administration.  Disposition: Discharge home  Discharge Date & Time: 04/15/2018;   hrs.   Physician-requested Follow-up: No follow-ups on file.  Future Appointments  Date Time Provider [place]  04/15/2018 10:15 AM Milinda Pointer, MD [facility] None  04/24/2018 10:45 AM Billey Co, MD [facility] None  05/28/2018  2:15 PM Jonathon Bellows,  MD [facility] None  06/11/2018  9:00 AM Leona Singleton, RN [facility] None  07/10/2018 11:00 AM McLean-Scocuzza, Nino Glow, MD [facility]   Primary Care Physician: McLean-Scocuzza, Nino Glow, MD Location: [facility] Note by: Gaspar Cola, MD Date: 04/15/2018; Time: 5:39 PM  Disclaimer:  Medicine is not an exact science. The only guarantee in medicine is that nothing is guaranteed. It is important to note that the decision to proceed with this intervention was based on the information collected from the patient. The Data and conclusions were drawn from the patient's questionnaire, the interview, and the physical examination. Because the information was provided in large part by the patient, it cannot be guaranteed that it has not been purposely or unconsciously manipulated. Every effort has been made to obtain as much relevant data as possible for this evaluation. It is important to note that the conclusions that lead to this procedure are derived in large part from the available data. Always take into account that the treatment will also be dependent on availability of resources and existing treatment guidelines, considered by other Pain Management Practitioners as being common knowledge and practice, at the time of the intervention. For Medico-Legal purposes, it is also important to point out that variation in procedural techniques and pharmacological choices are the acceptable norm. The indications, contraindications, technique, and results of the above procedure should only be interpreted and judged by a Board-Certified Interventional Pain Specialist with extensive familiarity and expertise in the same exact procedure and technique.

## 2018-04-15 ENCOUNTER — Ambulatory Visit: Payer: Medicare HMO | Admitting: Pain Medicine

## 2018-04-15 ENCOUNTER — Telehealth: Payer: Self-pay

## 2018-04-15 LAB — H. PYLORI BREATH TEST

## 2018-04-15 NOTE — Telephone Encounter (Signed)
Spoke with pt and informed him of his MRI Enterogram appointment information. Pt is aware that his appointment is scheduled on 04-30-18 at 10:00 am (8:30 arrival) at [facility]. Pt is also aware of prior prep instructions. Pt understands and verbally called back all information.

## 2018-04-19 ENCOUNTER — Other Ambulatory Visit: Payer: Self-pay

## 2018-04-19 ENCOUNTER — Telehealth: Payer: Self-pay

## 2018-04-19 DIAGNOSIS — R1013 Epigastric pain: Secondary | ICD-10-CM

## 2018-04-19 NOTE — Telephone Encounter (Signed)
Spoke with pt and informed him that he will need to repeat the H pylori breath test due to insufficient specimen quantity. Pt plans to come to our office for a lab visit next week.

## 2018-04-19 NOTE — Telephone Encounter (Signed)
-----   Message from Jonathon Bellows, MD sent at 04/16/2018  7:38 AM EST ----- Needs to be repeated    ----- Message ----- From: Interface, [facility] Lab Results In Sent: 04/15/2018   4:36 PM EST To: Jonathon Bellows, MD

## 2018-04-21 NOTE — Patient Instructions (Addendum)
____________________________________________________________________________________________  Post-Procedure Discharge Instructions  Instructions:  Apply ice: Fill a plastic sandwich bag with crushed ice. Cover it with a small towel and apply to injection site. Apply for 15 minutes then remove x 15 minutes. Repeat sequence on day of procedure, until you go to bed. The purpose is to minimize swelling and discomfort after procedure.  Apply heat: Apply heat to procedure site starting the day following the procedure. The purpose is to treat any soreness and discomfort from the procedure.  Food intake: Start with clear liquids (like water) and advance to regular food, as tolerated.   Physical activities: Keep activities to a minimum for the first 8 hours after the procedure.   Driving: If you have received any sedation, you are not allowed to drive for 24 hours after your procedure.  Blood thinner: Restart your blood thinner 6 hours after your procedure. (Only for those taking blood thinners)  Insulin: As soon as you can eat, you may resume your normal dosing schedule. (Only for those taking insulin)  Infection prevention: Keep procedure site clean and dry.  Post-procedure Pain Diary: Extremely important that this be done correctly and accurately. Recorded information will be used to determine the next step in treatment.  Pain evaluated is that of treated area only. Do not include pain from an untreated area.  Complete every hour, on the hour, for the initial 8 hours. Set an alarm to help you do this part accurately.  Do not go to sleep and have it completed later. It will not be accurate.  Follow-up appointment: Keep your follow-up appointment after the procedure. Usually 2 weeks for most procedures. (6 weeks in the case of radiofrequency.) Bring you pain diary.   Expect:  From numbing medicine (AKA: Local Anesthetics): Numbness or decrease in pain.  Onset: Full effect within 15  minutes of injected.  Duration: It will depend on the type of local anesthetic used. On the average, 1 to 8 hours.   From steroids: Decrease in swelling or inflammation. Once inflammation is improved, relief of the pain will follow.  Onset of benefits: Depends on the amount of swelling present. The more swelling, the longer it will take for the benefits to be seen. In some cases, up to 10 days.  Duration: Steroids will stay in the system x 2 weeks. Duration of benefits will depend on multiple posibilities including persistent irritating factors.  Occasional side-effects: Facial flushing (red, warm cheeks) , cramps (if present, drink Gatorade and take over-the-counter Magnesium 450-500 mg once to twice a day).  From procedure: Some discomfort is to be expected once the numbing medicine wears off. This should be minimal if ice and heat are applied as instructed.  Call if:  You experience numbness and weakness that gets worse with time, as opposed to wearing off.  New onset bowel or bladder incontinence. (This applies to Spinal procedures only)  Emergency Numbers:  Durning business hours (Monday - Thursday, 8:00 AM - 4:00 PM) (Friday, 9:00 AM - 12:00 Noon): [phone]  After hours: [phone] ____________________________________________________________________________________________   ____________________________________________________________________________________________  Preparing for your procedure (without sedation)  Instructions: . Oral Intake: Do not eat or drink anything for at least 3 hours prior to your procedure. . Transportation: Unless otherwise stated by your physician, you may drive yourself after the procedure. . Blood Pressure Medicine: Take your blood pressure medicine with a sip of water the morning of the procedure. . Blood thinners: Notify our staff if you are taking any blood  thinners. Depending on which one you take, there will be specific  instructions on how and when to stop it. . Diabetics on insulin: Notify the staff so that you can be scheduled 1st case in the morning. If your diabetes requires high dose insulin, take only  of your normal insulin dose the morning of the procedure and notify the staff that you have done so. . Preventing infections: Shower with an antibacterial soap the morning of your procedure.  . Build-up your immune system: Take 1000 mg of Vitamin C with every meal (3 times a day) the day prior to your procedure. Marland Kitchen Antibiotics: Inform the staff if you have a condition or reason that requires you to take antibiotics before dental procedures. . Pregnancy: If you are pregnant, call and cancel the procedure. . Sickness: If you have a cold, fever, or any active infections, call and cancel the procedure. . Arrival: You must be in the facility at least 30 minutes prior to your scheduled procedure. . Children: Do not bring any children with you. . Dress appropriately: Bring dark clothing that you would not mind if they get stained. . Valuables: Do not bring any jewelry or valuables.  Procedure appointments are reserved for interventional treatments only. Marland Kitchen No Prescription Refills. . No medication changes will be discussed during procedure appointments. . No disability issues will be discussed.  Reasons to call and reschedule or cancel your procedure: (Following these recommendations will minimize the risk of a serious complication.) . Surgeries: Avoid having procedures within 2 weeks of any surgery. (Avoid for 2 weeks before or after any surgery). . Flu Shots: Avoid having procedures within 2 weeks of a flu shots or . (Avoid for 2 weeks before or after immunizations). . Barium: Avoid having a procedure within 7-10 days after having had a radiological study involving the use of radiological contrast. (Myelograms, Barium swallow or enema study). . Heart attacks: Avoid any elective procedures or surgeries for the  initial 6 months after a "Myocardial Infarction" (Heart Attack). . Blood thinners: It is imperative that you stop these medications before procedures. Let us know if you if you take any blood thinner.  . Infection: Avoid procedures during or within two weeks of an infection (including chest colds or gastrointestinal problems). Symptoms associated with infections include: Localized redness, fever, chills, night sweats or profuse sweating, burning sensation when voiding, cough, congestion, stuffiness, runny nose, sore throat, diarrhea, nausea, vomiting, cold or Flu symptoms, recent or current infections. It is specially important if the infection is over the area that we intend to treat. Marland Kitchen Heart and lung problems: Symptoms that may suggest an active cardiopulmonary problem include: cough, chest pain, breathing difficulties or shortness of breath, dizziness, ankle swelling, uncontrolled high or unusually low blood pressure, and/or palpitations. If you are experiencing any of these symptoms, cancel your procedure and contact your primary care physician for an evaluation.  Remember:  Regular Business hours are:  Monday to Thursday 8:00 AM to 4:00 PM  Provider's Schedule: Milinda Pointer, MD:  Procedure days: Tuesday and Thursday 7:30 AM to 4:00 PM  Gillis Santa, MD:  Procedure days: Monday and Wednesday 7:30 AM to 4:00 PM ____________________________________________________________________________________________    ______________________________________________________________________________________________  Specialty Pain Scale  Introduction:  There are significant differences in how pain is reported. The word pain usually refers to physical pain, but it is also a common synonym of suffering. The medical community uses a scale from 0 (zero) to 10 (ten) to report pain level. Zero (0)  is described as "no pain", while ten (10) is described as "the worse pain you can imagine". The problem with  this scale is that physical pain is reported along with suffering. Suffering refers to mental pain, or more often yet it refers to any unpleasant feeling, emotion or aversion associated with the perception of harm or threat of harm. It is the psychological component of pain.  Pain Specialists prefer to separate the two components. The pain scale used by this practice is the Verbal Numerical Rating Scale (VNRS-11). This scale is for the physical pain only. DO NOT INCLUDE how your pain psychologically affects you. This scale is for adults 2 years of age and older. It has 11 (eleven) levels. The 1st level is 0/10. This means: "right now, I have no pain". In the context of pain management, it also means: "right now, my physical pain is under control with the current therapy".  General Information:  The scale should reflect your current level of pain. Unless you are specifically asked for the level of your worst pain, or your average pain. If you are asked for one of these two, then it should be understood that it is over the past 24 hours.  Levels 1 (one) through 5 (five) are described below, and can be treated as an outpatient. Ambulatory pain management facilities such as ours are more than adequate to treat these levels. Levels 6 (six) through 10 (ten) are also described below, however, these must be treated as a hospitalized patient. While levels 6 (six) and 7 (seven) may be evaluated at an urgent care facility, levels 8 (eight) through 10 (ten) constitute medical emergencies and as such, they belong in a hospital's emergency department. When having these levels (as described below), do not come to our office. Our facility is not equipped to manage these levels. Go directly to an urgent care facility or an emergency department to be evaluated.  Definitions:  Activities of Daily Living (ADL): Activities of daily living (ADL or ADLs) is a term used in healthcare to refer to people's daily self-care  activities. Health professionals often use a person's ability or inability to perform ADLs as a measurement of their functional status, particularly in regard to people post injury, with disabilities and the elderly. There are two ADL levels: Basic and Instrumental. Basic Activities of Daily Living (BADL  or BADLs) consist of self-care tasks that include: Bathing and showering; personal hygiene and grooming (including brushing/combing/styling hair); dressing; Toilet hygiene (getting to the toilet, cleaning oneself, and getting back up); eating and self-feeding (not including cooking or chewing and swallowing); functional mobility, often referred to as "transferring", as measured by the ability to walk, get in and out of bed, and get into and out of a chair; the broader definition (moving from one place to another while performing activities) is useful for people with different physical abilities who are still able to get around independently. Basic ADLs include the things many people do when they get up in the morning and get ready to go out of the house: get out of bed, go to the toilet, bathe, dress, groom, and eat. On the average, loss of function typically follows a particular order. Hygiene is the first to go, followed by loss of toilet use and locomotion. The last to go is the ability to eat. When there is only one remaining area in which the person is independent, there is a 62.9% chance that it is eating and only a 3.5% chance that  it is hygiene. Instrumental Activities of Daily Living (IADL or IADLs) are not necessary for fundamental functioning, but they let an individual live independently in a community. IADL consist of tasks that include: cleaning and maintaining the house; home establishment and maintenance; care of others (including selecting and supervising caregivers); care of pets; child rearing; managing money; managing financials (investments, etc.); meal preparation and cleanup; shopping for  groceries and necessities; moving within the community; safety procedures and emergency responses; health management and maintenance (taking prescribed medications); and using the telephone or other form of communication.  Instructions:  Most patients tend to report their pain as a combination of two factors, their physical pain and their psychosocial pain. This last one is also known as "suffering" and it is reflection of how physical pain affects you socially and psychologically. From now on, report them separately.  From this point on, when asked to report your pain level, report only your physical pain. Use the following table for reference.  Pain Clinic Pain Levels (0-5/10)  Pain Level Score  Description  No Pain 0   Mild pain 1 Nagging, annoying, but does not interfere with basic activities of daily living (ADL). Patients are able to eat, bathe, get dressed, toileting (being able to get on and off the toilet and perform personal hygiene functions), transfer (move in and out of bed or a chair without assistance), and maintain continence (able to control bladder and bowel functions). Blood pressure and heart rate are unaffected. A normal heart rate for a healthy adult ranges from 60 to 100 bpm (beats per minute).   Mild to moderate pain 2 Noticeable and distracting. Impossible to hide from other people. More frequent flare-ups. Still possible to adapt and function close to normal. It can be very annoying and may have occasional stronger flare-ups. With discipline, patients may get used to it and adapt.   Moderate pain 3 Interferes significantly with activities of daily living (ADL). It becomes difficult to feed, bathe, get dressed, get on and off the toilet or to perform personal hygiene functions. Difficult to get in and out of bed or a chair without assistance. Very distracting. With effort, it can be ignored when deeply involved in activities.   Moderately severe pain 4 Impossible to ignore for  more than a few minutes. With effort, patients may still be able to manage work or participate in some social activities. Very difficult to concentrate. Signs of autonomic nervous system discharge are evident: dilated pupils (mydriasis); mild sweating (diaphoresis); sleep interference. Heart rate becomes elevated (>115 bpm). Diastolic blood pressure (lower number) rises above 100 mmHg. Patients find relief in laying down and not moving.   Severe pain 5 Intense and extremely unpleasant. Associated with frowning face and frequent crying. Pain overwhelms the senses.  Ability to do any activity or maintain social relationships becomes significantly limited. Conversation becomes difficult. Pacing back and forth is common, as getting into a comfortable position is nearly impossible. Pain wakes you up from deep sleep. Physical signs will be obvious: pupillary dilation; increased sweating; goosebumps; brisk reflexes; cold, clammy hands and feet; nausea, vomiting or dry heaves; loss of appetite; significant sleep disturbance with inability to fall asleep or to remain asleep. When persistent, significant weight loss is observed due to the complete loss of appetite and sleep deprivation.  Blood pressure and heart rate becomes significantly elevated. Caution: If elevated blood pressure triggers a pounding headache associated with blurred vision, then the patient should immediately seek attention at an urgent  or emergency care unit, as these may be signs of an impending stroke.    Emergency Department Pain Levels (6-10/10)  Emergency Room Pain 6 Severely limiting. Requires emergency care and should not be seen or managed at an outpatient pain management facility. Communication becomes difficult and requires great effort. Assistance to reach the emergency department may be required. Facial flushing and profuse sweating along with potentially dangerous increases in heart rate and blood pressure will be evident.    Distressing pain 7 Self-care is very difficult. Assistance is required to transport, or use restroom. Assistance to reach the emergency department will be required. Tasks requiring coordination, such as bathing and getting dressed become very difficult.   Disabling pain 8 Self-care is no longer possible. At this level, pain is disabling. The individual is unable to do even the most "basic" activities such as walking, eating, bathing, dressing, transferring to a bed, or toileting. Fine motor skills are lost. It is difficult to think clearly.   Incapacitating pain 9 Pain becomes incapacitating. Thought processing is no longer possible. Difficult to remember your own name. Control of movement and coordination are lost.   The worst pain imaginable 10 At this level, most patients pass out from pain. When this level is reached, collapse of the autonomic nervous system occurs, leading to a sudden drop in blood pressure and heart rate. This in turn results in a temporary and dramatic drop in blood flow to the brain, leading to a loss of consciousness. Fainting is one of the body's self defense mechanisms. Passing out puts the brain in a calmed state and causes it to shut down for a while, in order to begin the healing process.    Summary: 1. Refer to this scale when providing [place] with your pain level. 2. Be accurate and careful when reporting your pain level. This will help with your care. 3. Over-reporting your pain level will lead to loss of credibility. 4. Even a level of 1/10 means that there is pain and will be treated at our facility. 5. High, inaccurate reporting will be documented as "Symptom Exaggeration", leading to loss of credibility and suspicions of possible secondary gains such as obtaining more narcotics, or wanting to appear disabled, for fraudulent reasons. 6. Only pain levels of 5 or below will be seen at our facility. 7. Pain levels of 6 and above will be sent to the Emergency Department  and the appointment cancelled. ______________________________________________________________________________________________

## 2018-04-21 NOTE — Progress Notes (Signed)
Patient's Name: Nicholas Reyes  MRN: [id number]  Referring Provider: Orland Reyes *  DOB: 08-16-1955  PCP: McLean-Scocuzza, Nino Glow, MD  DOS: 04/22/2018  Note by: Nicholas Cola, MD  Service setting: Ambulatory outpatient  Specialty: Interventional Pain Management  Patient type: Established  Location: [facility] (AMB) Pain Management Facility  Visit type: Interventional Procedure   Primary Reason for Visit: Interventional Pain Management Treatment. CC: Knee Pain (bilateral) and Hand Pain (left)  Procedure:          Anesthesia, Analgesia, Anxiolysis:  Type: Therapeutic Intra-Articular Hyalgan Knee Injection #4  Region: Lateral infrapatellar Knee Region Level: Knee Joint Laterality: Bilateral  Type: Local Anesthesia Indication(s): Analgesia         Local Anesthetic: Lidocaine 1-2% Route: Infiltration (/IM) IV Access: Declined Sedation: Declined   Position: Sitting   Indications: 1. 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 Chronic knee pain (Primary Area of Pain) (Bilateral) (R>L)   3. Chronic anticoagulation (PLAVIX)    Pain Score: Pre-procedure: 10-Worst pain ever/10 Post-procedure: 10-Worst pain ever/10  Post-Procedure Assessment  03/27/2018 Procedure: Therapeutic bilateral intra-articular Hyalgan knee injection #3, no fluoroscopy or IV sedation. Pre-procedure pain score:  0/10 Post-procedure pain score: 0/10         Influential Factors: BMI: 37.59 kg/m Intra-procedural challenges: None observed.         Assessment challenges: None detected.              Reported side-effects: None.        Post-procedural adverse reactions or complications: None reported         Sedation: No sedation used. When no sedatives are used, the analgesic levels obtained are directly associated to the effectiveness of the local anesthetics. However, when sedation is provided, the level of analgesia obtained during the initial 1 hour following the intervention, is believed to be the  result of a combination of factors. These factors may include, but are not limited to: 1. The effectiveness of the local anesthetics used. 2. The effects of the analgesic(s) and/or anxiolytic(s) used. 3. The degree of discomfort experienced by the patient at the time of the procedure. 4. The patients ability and reliability in recalling and recording the events. 5. The presence and influence of possible secondary gains and/or psychosocial factors. Reported result: Relief experienced during the 1st hour after the procedure: 100 % (Ultra-Short Term Relief)            Interpretative annotation: Clinically appropriate result. No IV Analgesic or Anxiolytic given, therefore benefits are completely due to Local Anesthetic effects.          Effects of local anesthetic: The analgesic effects attained during this period are directly associated to the localized infiltration of local anesthetics and therefore cary significant diagnostic value as to the etiological location, or anatomical origin, of the pain. Expected duration of relief is directly dependent on the pharmacodynamics of the local anesthetic used. Long-acting (4-6 hours) anesthetics used.  Reported result: Relief during the next 4 to 6 hour after the procedure: 100 % (Short-Term Relief)            Interpretative annotation: Clinically appropriate result. Analgesia during this period is likely to be Local Anesthetic-related.          Long-term benefit: Defined as the period of time past the expected duration of local anesthetics (1 hour for short-acting and 4-6 hours for long-acting). With the possible exception of prolonged sympathetic blockade from the local anesthetics, benefits during this period are  typically attributed to, or associated with, other factors such as analgesic sensory neuropraxia, antiinflammatory effects, or beneficial biochemical changes provided by agents other than the local anesthetics.  Reported result: Extended relief  following procedure: 0 % (Long-Term Relief)            Interpretative annotation: Clinically possible results. Good relief. No permanent benefit expected. Inflammation plays a part in the etiology to the pain.          Current benefits: Defined as reported results that persistent at this point in time.   Analgesia: 0 %            Function: Back to baseline ROM: Back to baseline Interpretative annotation: Recurrence of symptoms. No permanent benefit expected. Effective diagnostic intervention.          Interpretation: Results would suggest a successful diagnostic intervention.                  Plan:  Proceed with treatment No.: 4          Pre-op Assessment:  Nicholas Reyes is a 62 y.o. (year old), male patient, seen today for interventional treatment. He  has a past surgical history that includes Cataract extraction (Bilateral); Cholecystectomy; Appendectomy; Tonsillectomy; STENT PLACEMENT VASCULAR ([place] HX); right knee surgery; Tonsillectomy and adenoidectomy; hemorrhoid surgery ; Eye surgery; Coronary angioplasty with stent; Hemorrhoid surgery; Knee surgery; and LEFT HEART CATH AND CORONARY ANGIOGRAPHY (Left, 01/30/2018). Nicholas Reyes has a current medication list which includes the following prescription(s): albuterol, amlodipine, aspirin, atorvastatin, budesonide-formoterol, cetirizine, cholecalciferol, clobetasol ointment, clopidogrel, diclofenac, duloxetine, furosemide, hydroxyzine, isosorbide mononitrate, ketorolac, meloxicam, metformin, metoprolol tartrate, montelukast, mupirocin ointment, nitroglycerin, omeprazole, ondansetron, prednisolone acetate, secukinumab, spironolactone, triamcinolone cream, cephalexin, doxycycline, oxycodone-acetaminophen, pantoprazole, ranitidine, and sucralfate. His primarily concern today is the Knee Pain (bilateral) and Hand Pain (left)  Today the patient indicates having a flareup of his knee pain and left hand pain.  He says that it is arthritis.  However, I am  concerned that this may be associated to some type of inflammatory arthropathy, perhaps gout.  Initial Vital Signs:  Pulse/HCG Rate: 87  Temp: 98.1 F (36.7 C) Resp: 16 BP: (!) 144/98 SpO2: 95 %  BMI: Estimated body mass index is 37.59 kg/m as calculated from the following:   Height as of this encounter: 5\' 7"  (1.702 m).   Weight as of this encounter: 240 lb (108.9 kg).  Risk Assessment: Allergies: Reviewed. He has No Known Allergies.  Allergy Precautions: None required Coagulopathies: Reviewed. None identified.  Blood-thinner therapy: None at this time Active Infection(s): Reviewed. None identified. Mr. Haughn is afebrile  Site Confirmation: Mr. Clutter was asked to confirm the procedure and laterality before marking the site Procedure checklist: Completed Consent: Before the procedure and under the influence of no sedative(s), amnesic(s), or anxiolytics, the patient was informed of the treatment options, risks and possible complications. To fulfill our ethical and legal obligations, as recommended by the American Medical Association's Code of Ethics, I have informed the patient of my clinical impression; the nature and purpose of the treatment or procedure; the risks, benefits, and possible complications of the intervention; the alternatives, including doing nothing; the risk(s) and benefit(s) of the alternative treatment(s) or procedure(s); and the risk(s) and benefit(s) of doing nothing. The patient was provided information about the general risks and possible complications associated with the procedure. These may include, but are not limited to: failure to achieve desired goals, infection, bleeding, organ or nerve damage, allergic reactions, paralysis, and death. In addition,  the patient was informed of those risks and complications associated to the procedure, such as failure to decrease pain; infection; bleeding; organ or nerve damage with subsequent damage to sensory, motor, and/or  autonomic systems, resulting in permanent pain, numbness, and/or weakness of one or several areas of the body; allergic reactions; (i.e.: anaphylactic reaction); and/or death. Furthermore, the patient was informed of those risks and complications associated with the medications. These include, but are not limited to: allergic reactions (i.e.: anaphylactic or anaphylactoid reaction(s)); adrenal axis suppression; blood sugar elevation that in diabetics may result in ketoacidosis or comma; water retention that in patients with history of congestive heart failure may result in shortness of breath, pulmonary edema, and decompensation with resultant heart failure; weight gain; swelling or edema; medication-induced neural toxicity; particulate matter embolism and blood vessel occlusion with resultant organ, and/or nervous system infarction; and/or aseptic necrosis of one or more joints. Finally, the patient was informed that Medicine is not an exact science; therefore, there is also the possibility of unforeseen or unpredictable risks and/or possible complications that may result in a catastrophic outcome. The patient indicated having understood very clearly. We have given the patient no guarantees and we have made no promises. Enough time was given to the patient to ask questions, all of which were answered to the patient's satisfaction. Mr. Dragoo has indicated that he wanted to continue with the procedure. Attestation: I, the ordering provider, attest that I have discussed with the patient the benefits, risks, side-effects, alternatives, likelihood of achieving goals, and potential problems during recovery for the procedure that I have provided informed consent. Date  Time: 04/22/2018  2:21 PM  Pre-Procedure Preparation:  Monitoring: As per clinic protocol. Respiration, ETCO2, SpO2, BP, heart rate and rhythm monitor placed and checked for adequate function Safety Precautions: Patient was assessed for positional  comfort and pressure points before starting the procedure. Time-out: I initiated and conducted the "Time-out" before starting the procedure, as per protocol. The patient was asked to participate by confirming the accuracy of the "Time Out" information. Verification of the correct person, site, and procedure were performed and confirmed by me, the nursing staff, and the patient. "Time-out" conducted as per Joint Commission's Universal Protocol (UP.01.01.01). Time: 1516  Description of Procedure:          Target Area: Knee Joint Approach: Just above the Lateral tibial plateau, lateral to the infrapatellar tendon. Area Prepped: Entire knee area, from the mid-thigh to the mid-shin. Prepping solution: ChloraPrep (2% chlorhexidine gluconate and 70% isopropyl alcohol) Safety Precautions: Aspiration looking for blood return was conducted prior to all injections. At no point did we inject any substances, as a needle was being advanced. No attempts were made at seeking any paresthesias. Safe injection practices and needle disposal techniques used. Medications properly checked for expiration dates. SDV (single dose vial) medications used. Description of the Procedure: Protocol guidelines were followed. The patient was placed in position over the fluoroscopy table. The target area was identified and the area prepped in the usual manner. Skin & deeper tissues infiltrated with local anesthetic. Appropriate amount of time allowed to pass for local anesthetics to take effect. The procedure needles were then advanced to the target area. Proper needle placement secured. Negative aspiration confirmed. Solution injected in intermittent fashion, asking for systemic symptoms every 0.5cc of injectate. The needles were then removed and the area cleansed, making sure to leave some of the prepping solution back to take advantage of its long term bactericidal properties. Vitals:  04/22/18 1420  BP: (!) 144/98  Pulse: 87  Resp:  16  Temp: 98.1 F (36.7 C)  TempSrc: Oral  SpO2: 95%  Weight: 240 lb (108.9 kg)  Height: 5\' 7"  (1.702 m)    Start Time: 1516 hrs. End Time: 1518 hrs. Materials:  Needle(s) Type: Regular needle Gauge: 25G Length: 1.5-in Medication(s): Please see orders for medications and dosing details.  Imaging Guidance:          Type of Imaging Technique: None used Indication(s): N/A Exposure Time: No patient exposure Contrast: None used. Fluoroscopic Guidance: N/A Ultrasound Guidance: N/A Interpretation: N/A  Antibiotic Prophylaxis:   Anti-infectives (From admission, onward)   None     Indication(s): None identified  Post-operative Assessment:  Post-procedure Vital Signs:  Pulse/HCG Rate: 87  Temp: 98.1 F (36.7 C) Resp: 16 BP: (!) 144/98 SpO2: 95 %  EBL: None  Complications: No immediate post-treatment complications observed by team, or reported by patient.  Note: The patient tolerated the entire procedure well. A repeat set of vitals were taken after the procedure and the patient was kept under observation following institutional policy, for this type of procedure. Post-procedural neurological assessment was performed, showing return to baseline, prior to discharge. The patient was provided with post-procedure discharge instructions, including a section on how to identify potential problems. Should any problems arise concerning this procedure, the patient was given instructions to immediately contact us, at any time, without hesitation. In any case, we plan to contact the patient by telephone for a follow-up status report regarding this interventional procedure.  Comments:  No additional relevant information.  Plan of Care   Imaging Orders  No imaging studies ordered today    Procedure Orders     KNEE INJECTION     KNEE INJECTION  Medications ordered for procedure: Meds ordered this encounter  Medications  . lidocaine (PF) (XYLOCAINE) 1 % injection 4 mL  .  ropivacaine (PF) 2 mg/mL (0.2%) (NAROPIN) injection 4 mL  . Sodium Hyaluronate SOSY 2 mL  . Sodium Hyaluronate SOSY 2 mL   Medications administered: We administered lidocaine (PF), ropivacaine (PF) 2 mg/mL (0.2%), Sodium Hyaluronate, and Sodium Hyaluronate.  See the medical record for exact dosing, route, and time of administration.  Disposition: Discharge home  Discharge Date & Time: 04/22/2018; 1525 hrs.   Physician-requested Follow-up: Return for PPE (2 wks) + Procedure (no sedation): Hyalgan #5.  Future Appointments  Date Time Provider [place]  04/24/2018 10:45 AM Billey Co, MD [facility] None  04/30/2018 10:00 AM [facility] [place]  04/30/2018 11:00 AM [facility] [place]  05/14/2018  2:00 PM Milinda Pointer, MD [facility] None  05/28/2018  2:15 PM Jonathon Bellows, MD [facility] None  06/11/2018  9:00 AM Leona Singleton, RN [facility] None  07/10/2018 11:00 AM McLean-Scocuzza, Nino Glow, MD [facility]   Primary Care Physician: McLean-Scocuzza, Nino Glow, MD Location: [facility] Note by: Nicholas Cola, MD Date: 04/22/2018; Time: 5:03 PM  Disclaimer:  Medicine is not an Chief Strategy Officer. The only guarantee in medicine is that nothing is guaranteed. It is important to note that the decision to proceed with this intervention was based on the information collected from the patient. The Data and conclusions were drawn from the patient's questionnaire, the interview, and the physical examination. Because the information was provided in large part by the patient, it cannot be guaranteed that it has not been purposely or unconsciously manipulated. Every effort has been made to obtain  as much relevant data as possible for this evaluation. It is important to note that the conclusions that lead to this procedure are derived in large part from the available data. Always take into account that the treatment will also be dependent on availability of  resources and existing treatment guidelines, considered by other Pain Management Practitioners as being common knowledge and practice, at the time of the intervention. For Medico-Legal purposes, it is also important to point out that variation in procedural techniques and pharmacological choices are the acceptable norm. The indications, contraindications, technique, and results of the above procedure should only be interpreted and judged by a Board-Certified Interventional Pain Specialist with extensive familiarity and expertise in the same exact procedure and technique.

## 2018-04-22 ENCOUNTER — Other Ambulatory Visit: Payer: Self-pay

## 2018-04-22 ENCOUNTER — Encounter: Payer: Self-pay | Admitting: Pain Medicine

## 2018-04-22 ENCOUNTER — Ambulatory Visit (HOSPITAL_BASED_OUTPATIENT_CLINIC_OR_DEPARTMENT_OTHER): Payer: Medicare HMO | Admitting: Pain Medicine

## 2018-04-22 ENCOUNTER — Other Ambulatory Visit
Admission: RE | Admit: 2018-04-22 | Discharge: 2018-04-22 | Disposition: A | Discharge status: Home / Self Care | Payer: Medicare HMO | Source: Ambulatory Visit | Attending: Internal Medicine | Admitting: Internal Medicine

## 2018-04-22 VITALS — BP 144/98 | HR 87 | Temp 98.1°F | Resp 16 | Ht 67.0 in | Wt 240.0 lb

## 2018-04-22 DIAGNOSIS — Z79899 Other long term (current) drug therapy: Secondary | ICD-10-CM | POA: Insufficient documentation

## 2018-04-22 DIAGNOSIS — Z791 Long term (current) use of non-steroidal anti-inflammatories (NSAID): Secondary | ICD-10-CM | POA: Insufficient documentation

## 2018-04-22 DIAGNOSIS — M79641 Pain in right hand: Secondary | ICD-10-CM | POA: Diagnosis not present

## 2018-04-22 DIAGNOSIS — M170 Bilateral primary osteoarthritis of knee: Secondary | ICD-10-CM

## 2018-04-22 DIAGNOSIS — Z7901 Long term (current) use of anticoagulants: Secondary | ICD-10-CM | POA: Diagnosis not present

## 2018-04-22 DIAGNOSIS — Z7902 Long term (current) use of antithrombotics/antiplatelets: Secondary | ICD-10-CM | POA: Diagnosis not present

## 2018-04-22 DIAGNOSIS — Z9049 Acquired absence of other specified parts of digestive tract: Secondary | ICD-10-CM | POA: Diagnosis not present

## 2018-04-22 DIAGNOSIS — M25561 Pain in right knee: Secondary | ICD-10-CM

## 2018-04-22 DIAGNOSIS — M79642 Pain in left hand: Secondary | ICD-10-CM | POA: Diagnosis not present

## 2018-04-22 DIAGNOSIS — Z7984 Long term (current) use of oral hypoglycemic drugs: Secondary | ICD-10-CM | POA: Insufficient documentation

## 2018-04-22 DIAGNOSIS — Z955 Presence of coronary angioplasty implant and graft: Secondary | ICD-10-CM | POA: Diagnosis not present

## 2018-04-22 DIAGNOSIS — G8929 Other chronic pain: Secondary | ICD-10-CM

## 2018-04-22 DIAGNOSIS — M25562 Pain in left knee: Secondary | ICD-10-CM

## 2018-04-22 DIAGNOSIS — Z9889 Other specified postprocedural states: Secondary | ICD-10-CM | POA: Insufficient documentation

## 2018-04-22 MED ORDER — SODIUM HYALURONATE (VISCOSUP) 20 MG/2ML IX SOSY
2.0000 mL | PREFILLED_SYRINGE | Freq: Once | INTRA_ARTICULAR | Status: AC
  Administered 2018-04-22: 2 mL via INTRA_ARTICULAR

## 2018-04-22 MED ORDER — ROPIVACAINE HCL 2 MG/ML IJ SOLN
INTRAMUSCULAR | Status: AC
  Filled 2018-04-22: qty 10

## 2018-04-22 MED ORDER — LIDOCAINE HCL (PF) 1 % IJ SOLN
4.0000 mL | Freq: Once | INTRAMUSCULAR | Status: AC
  Administered 2018-04-22: 5 mL

## 2018-04-22 MED ORDER — LIDOCAINE HCL (PF) 1 % IJ SOLN
INTRAMUSCULAR | Status: AC
  Filled 2018-04-22: qty 5

## 2018-04-22 MED ORDER — ROPIVACAINE HCL 2 MG/ML IJ SOLN
4.0000 mL | Freq: Once | INTRAMUSCULAR | Status: AC
  Administered 2018-04-22: 10 mL via INTRA_ARTICULAR

## 2018-04-24 ENCOUNTER — Ambulatory Visit: Payer: Medicare HMO | Admitting: Urology

## 2018-04-24 ENCOUNTER — Encounter: Payer: Self-pay | Admitting: Urology

## 2018-04-24 LAB — H. PYLORI BREATH TEST

## 2018-04-27 ENCOUNTER — Other Ambulatory Visit: Payer: Self-pay | Admitting: Family

## 2018-04-27 DIAGNOSIS — K219 Gastro-esophageal reflux disease without esophagitis: Secondary | ICD-10-CM

## 2018-04-30 ENCOUNTER — Telehealth: Payer: Self-pay | Admitting: Internal Medicine

## 2018-04-30 ENCOUNTER — Other Ambulatory Visit
Admission: RE | Admit: 2018-04-30 | Discharge: 2018-04-30 | Disposition: A | Discharge status: Home / Self Care | Payer: Medicare HMO | Source: Ambulatory Visit | Attending: Internal Medicine | Admitting: Internal Medicine

## 2018-04-30 ENCOUNTER — Ambulatory Visit
Admission: RE | Admit: 2018-04-30 | Discharge: 2018-04-30 | Disposition: A | Discharge status: Home / Self Care | Payer: Medicare HMO | Source: Ambulatory Visit | Attending: Gastroenterology | Admitting: Gastroenterology

## 2018-04-30 ENCOUNTER — Encounter: Payer: Self-pay | Admitting: Gastroenterology

## 2018-04-30 DIAGNOSIS — Z9049 Acquired absence of other specified parts of digestive tract: Secondary | ICD-10-CM | POA: Insufficient documentation

## 2018-04-30 DIAGNOSIS — R933 Abnormal findings on diagnostic imaging of other parts of digestive tract: Secondary | ICD-10-CM | POA: Diagnosis not present

## 2018-04-30 DIAGNOSIS — N281 Cyst of kidney, acquired: Secondary | ICD-10-CM | POA: Insufficient documentation

## 2018-04-30 DIAGNOSIS — K529 Noninfective gastroenteritis and colitis, unspecified: Secondary | ICD-10-CM

## 2018-04-30 DIAGNOSIS — R1013 Epigastric pain: Secondary | ICD-10-CM | POA: Insufficient documentation

## 2018-04-30 LAB — GASTROINTESTINAL PANEL BY PCR, STOOL (REPLACES STOOL CULTURE)
Adenovirus F40/41: NOT DETECTED
Astrovirus: NOT DETECTED
Campylobacter species: NOT DETECTED
Cryptosporidium: NOT DETECTED
Cyclospora cayetanensis: NOT DETECTED
Entamoeba histolytica: NOT DETECTED
Enteroaggregative E coli (EAEC): NOT DETECTED
Enteropathogenic E coli (EPEC): DETECTED — AB
Enterotoxigenic E coli (ETEC): NOT DETECTED
Giardia lamblia: NOT DETECTED
Norovirus GI/GII: NOT DETECTED
PLESIMONAS SHIGELLOIDES: NOT DETECTED
ROTAVIRUS A: NOT DETECTED
SHIGA LIKE TOXIN PRODUCING E COLI (STEC): NOT DETECTED
Salmonella species: NOT DETECTED
Sapovirus (I, II, IV, and V): NOT DETECTED
Shigella/Enteroinvasive E coli (EIEC): NOT DETECTED
Vibrio cholerae: NOT DETECTED
Vibrio species: NOT DETECTED
Yersinia enterocolitica: NOT DETECTED

## 2018-04-30 LAB — C DIFFICILE QUICK SCREEN W PCR REFLEX
C DIFFICILE (CDIFF) INTERP: NOT DETECTED
C Diff antigen: NEGATIVE
C Diff toxin: NEGATIVE

## 2018-04-30 MED ORDER — GADOBUTROL 1 MMOL/ML IV SOLN
10.0000 mL | Freq: Once | INTRAVENOUS | Status: AC | PRN
  Administered 2018-04-30: 10 mL via INTRAVENOUS

## 2018-04-30 NOTE — Telephone Encounter (Signed)
FYI

## 2018-04-30 NOTE — Telephone Encounter (Signed)
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  Report called to Sarah at office.  She will forward to On Call Physician.

## 2018-04-30 NOTE — Telephone Encounter (Signed)
Stool + Ecoli we cant treat this  Peptobismol, low sugar gatorade   [place]

## 2018-04-30 NOTE — Telephone Encounter (Signed)
Call placed to patient.  Mailbox full and unable to leave message

## 2018-04-30 NOTE — Telephone Encounter (Signed)
Left message for patient to return call back. [facility] may give results and obtain information.  

## 2018-04-30 NOTE — Telephone Encounter (Signed)
Pt given results per notes of Dr Terese Door on 04/30/18. Unable to document in result note due to result note not being routed to [facility].

## 2018-05-01 DIAGNOSIS — R079 Chest pain, unspecified: Secondary | ICD-10-CM | POA: Diagnosis not present

## 2018-05-01 DIAGNOSIS — J449 Chronic obstructive pulmonary disease, unspecified: Secondary | ICD-10-CM | POA: Diagnosis not present

## 2018-05-01 DIAGNOSIS — H5712 Ocular pain, left eye: Secondary | ICD-10-CM | POA: Diagnosis not present

## 2018-05-01 DIAGNOSIS — R0789 Other chest pain: Secondary | ICD-10-CM | POA: Diagnosis not present

## 2018-05-01 DIAGNOSIS — R55 Syncope and collapse: Secondary | ICD-10-CM | POA: Diagnosis not present

## 2018-05-01 DIAGNOSIS — F329 Major depressive disorder, single episode, unspecified: Secondary | ICD-10-CM | POA: Diagnosis not present

## 2018-05-01 DIAGNOSIS — R109 Unspecified abdominal pain: Secondary | ICD-10-CM | POA: Diagnosis not present

## 2018-05-01 DIAGNOSIS — I10 Essential (primary) hypertension: Secondary | ICD-10-CM | POA: Diagnosis not present

## 2018-05-01 DIAGNOSIS — R0602 Shortness of breath: Secondary | ICD-10-CM | POA: Diagnosis not present

## 2018-05-01 DIAGNOSIS — E785 Hyperlipidemia, unspecified: Secondary | ICD-10-CM | POA: Diagnosis not present

## 2018-05-01 DIAGNOSIS — R51 Headache: Secondary | ICD-10-CM | POA: Diagnosis not present

## 2018-05-01 DIAGNOSIS — E119 Type 2 diabetes mellitus without complications: Secondary | ICD-10-CM | POA: Diagnosis not present

## 2018-05-02 ENCOUNTER — Other Ambulatory Visit: Payer: Self-pay | Admitting: Internal Medicine

## 2018-05-02 NOTE — Telephone Encounter (Signed)
Patient called about refill request. I advised according to his chart, it hasn't been refilled since 2013. He says that he's been getting it refilled since then and he is out and need a refill. I asked is he currently having CP, he denies.

## 2018-05-02 NOTE — Telephone Encounter (Signed)
Requested medication (s) are due for refill today: Yes  Requested medication (s) are on the active medication list: Yes  Last refill:  08/2011 per chart  Future visit scheduled: Yes  Notes to clinic:  Unable to refill, expired Rx in chart     Requested Prescriptions  Pending Prescriptions Disp Refills   nitroGLYCERIN (NITROSTAT) 0.4 MG SL tablet      Sig: Place 1 tablet (0.4 mg total) under the tongue every 5 (five) minutes as needed. For chest pain     Cardiovascular:  Nitrates Failed - 05/02/2018  1:10 PM      Failed - Last BP in normal range    BP Readings from Last 1 Encounters:  04/22/18 (!) 144/98         Passed - Last Heart Rate in normal range    Pulse Readings from Last 1 Encounters:  04/22/18 87         Passed - Valid encounter within last 12 months    Recent Outpatient Visits          3 weeks ago [place] McLean-Scocuzza, Nino Glow, MD   1 month ago Skin infection   [place] Guse, Jacquelynn Cree, FNP   2 months ago Right upper quadrant abdominal pain   [place], FNP   3 months ago Chest pain in adult   [place] Guse, Jacquelynn Cree, FNP   4 months ago [place] McLean-Scocuzza, Nino Glow, MD      Future Appointments            In 3 weeks Jonathon Bellows, MD [place]   In 2 months McLean-Scocuzza, Nino Glow, MD [facility], [place] [facility]

## 2018-05-02 NOTE — Telephone Encounter (Signed)
Copied from [place] [id number]. Topic: Quick Communication - See Telephone Encounter >> May 02, 2018 12:19 PM Ivar Drape wrote: CRM for notification. See Telephone encounter for: 05/02/18. Patient would like a refill on his nitroGLYCERIN (NITROSTAT) 0.4 MG SL tablet medication and have it sent to his preferred pharmacy [facility] on [address]

## 2018-05-03 ENCOUNTER — Other Ambulatory Visit: Payer: Self-pay | Admitting: Internal Medicine

## 2018-05-03 ENCOUNTER — Telehealth: Payer: Self-pay

## 2018-05-03 DIAGNOSIS — I10 Essential (primary) hypertension: Secondary | ICD-10-CM

## 2018-05-03 MED ORDER — SPIRONOLACTONE 25 MG PO TABS: 25.0000 mg | ORAL_TABLET | Freq: Every day | ORAL | 3 refills | Status: AC
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
## 2018-05-03 NOTE — Telephone Encounter (Signed)
Pt calling about medicine refill

## 2018-05-03 NOTE — Telephone Encounter (Signed)
Fax refill request to Dr. Josefa Half for this medication NTG   [place]

## 2018-05-03 NOTE — Telephone Encounter (Signed)
This is a duplicate note. Provider has already been informed.

## 2018-05-06 ENCOUNTER — Emergency Department
Admission: EM | Admit: 2018-05-06 | Discharge: 2018-05-06 | Discharge status: Against Medical Advice | Payer: Medicare HMO | Attending: Emergency Medicine | Admitting: Emergency Medicine

## 2018-05-06 ENCOUNTER — Other Ambulatory Visit: Payer: Self-pay

## 2018-05-06 DIAGNOSIS — R109 Unspecified abdominal pain: Secondary | ICD-10-CM | POA: Diagnosis not present

## 2018-05-06 DIAGNOSIS — Z5321 Procedure and treatment not carried out due to patient leaving prior to being seen by health care provider: Secondary | ICD-10-CM | POA: Insufficient documentation

## 2018-05-06 LAB — COMPREHENSIVE METABOLIC PANEL
ALT: 56 U/L — ABNORMAL HIGH (ref 0–44)
AST: 30 U/L (ref 15–41)
Albumin: 3.6 g/dL (ref 3.5–5.0)
Alkaline Phosphatase: 56 U/L (ref 38–126)
Anion gap: 8 (ref 5–15)
BILIRUBIN TOTAL: 0.3 mg/dL (ref 0.3–1.2)
BUN: 16 mg/dL (ref 8–23)
CO2: 33 mmol/L — ABNORMAL HIGH (ref 22–32)
Calcium: 8.9 mg/dL (ref 8.9–10.3)
Chloride: 98 mmol/L (ref 98–111)
Creatinine, Ser: 0.71 mg/dL (ref 0.61–1.24)
Glucose, Bld: 167 mg/dL — ABNORMAL HIGH (ref 70–99)
Potassium: 3.3 mmol/L — ABNORMAL LOW (ref 3.5–5.1)
Sodium: 139 mmol/L (ref 135–145)
TOTAL PROTEIN: 6.7 g/dL (ref 6.5–8.1)

## 2018-05-06 LAB — URINALYSIS, COMPLETE (UACMP) WITH MICROSCOPIC
Bacteria, UA: NONE SEEN
Bilirubin Urine: NEGATIVE
Glucose, UA: NEGATIVE mg/dL
Hgb urine dipstick: NEGATIVE
Ketones, ur: NEGATIVE mg/dL
Leukocytes, UA: NEGATIVE
Nitrite: NEGATIVE
PH: 7 (ref 5.0–8.0)
Protein, ur: NEGATIVE mg/dL
Specific Gravity, Urine: 1.015 (ref 1.005–1.030)
Squamous Epithelial / HPF: NONE SEEN (ref 0–5)
WBC, UA: NONE SEEN WBC/hpf (ref 0–5)

## 2018-05-06 LAB — CBC
HCT: 42.6 % (ref 39.0–52.0)
Hemoglobin: 13 g/dL (ref 13.0–17.0)
MCH: 28.8 pg (ref 26.0–34.0)
MCHC: 30.5 g/dL (ref 30.0–36.0)
MCV: 94.2 fL (ref 80.0–100.0)
Platelets: 142 10*3/uL — ABNORMAL LOW (ref 150–400)
RBC: 4.52 MIL/uL (ref 4.22–5.81)
RDW: 14.8 % (ref 11.5–15.5)
WBC: 8.8 10*3/uL (ref 4.0–10.5)
nRBC: 0 % (ref 0.0–0.2)

## 2018-05-06 LAB — LIPASE, BLOOD: Lipase: 34 U/L (ref 11–51)

## 2018-05-06 NOTE — ED Notes (Signed)
Unable to locate patient to given an up date.

## 2018-05-06 NOTE — ED Triage Notes (Signed)
Patient reports right side pain.  Denies nausea, vomiting, diarrhea or urinary symptoms.

## 2018-05-08 ENCOUNTER — Encounter: Payer: Self-pay | Admitting: Emergency Medicine

## 2018-05-08 ENCOUNTER — Emergency Department: Payer: Medicare HMO

## 2018-05-08 ENCOUNTER — Other Ambulatory Visit: Payer: Self-pay

## 2018-05-08 ENCOUNTER — Emergency Department
Admission: EM | Admit: 2018-05-08 | Discharge: 2018-05-08 | Disposition: A | Discharge status: Home / Self Care | Payer: Medicare HMO | Attending: Emergency Medicine | Admitting: Emergency Medicine

## 2018-05-08 DIAGNOSIS — Z79899 Other long term (current) drug therapy: Secondary | ICD-10-CM | POA: Diagnosis not present

## 2018-05-08 DIAGNOSIS — Z9049 Acquired absence of other specified parts of digestive tract: Secondary | ICD-10-CM | POA: Insufficient documentation

## 2018-05-08 DIAGNOSIS — J45909 Unspecified asthma, uncomplicated: Secondary | ICD-10-CM | POA: Diagnosis not present

## 2018-05-08 DIAGNOSIS — J0100 Acute maxillary sinusitis, unspecified: Secondary | ICD-10-CM | POA: Diagnosis not present

## 2018-05-08 DIAGNOSIS — I252 Old myocardial infarction: Secondary | ICD-10-CM | POA: Diagnosis not present

## 2018-05-08 DIAGNOSIS — F329 Major depressive disorder, single episode, unspecified: Secondary | ICD-10-CM | POA: Diagnosis not present

## 2018-05-08 DIAGNOSIS — Z7984 Long term (current) use of oral hypoglycemic drugs: Secondary | ICD-10-CM | POA: Diagnosis not present

## 2018-05-08 DIAGNOSIS — I2510 Atherosclerotic heart disease of native coronary artery without angina pectoris: Secondary | ICD-10-CM | POA: Insufficient documentation

## 2018-05-08 DIAGNOSIS — Z7982 Long term (current) use of aspirin: Secondary | ICD-10-CM | POA: Insufficient documentation

## 2018-05-08 DIAGNOSIS — E119 Type 2 diabetes mellitus without complications: Secondary | ICD-10-CM | POA: Insufficient documentation

## 2018-05-08 DIAGNOSIS — Z955 Presence of coronary angioplasty implant and graft: Secondary | ICD-10-CM | POA: Diagnosis not present

## 2018-05-08 DIAGNOSIS — I10 Essential (primary) hypertension: Secondary | ICD-10-CM | POA: Diagnosis not present

## 2018-05-08 DIAGNOSIS — J3489 Other specified disorders of nose and nasal sinuses: Secondary | ICD-10-CM | POA: Diagnosis not present

## 2018-05-08 DIAGNOSIS — J449 Chronic obstructive pulmonary disease, unspecified: Secondary | ICD-10-CM | POA: Diagnosis not present

## 2018-05-08 DIAGNOSIS — J069 Acute upper respiratory infection, unspecified: Secondary | ICD-10-CM | POA: Diagnosis not present

## 2018-05-08 DIAGNOSIS — R0981 Nasal congestion: Secondary | ICD-10-CM | POA: Diagnosis not present

## 2018-05-08 DIAGNOSIS — Z87891 Personal history of nicotine dependence: Secondary | ICD-10-CM | POA: Insufficient documentation

## 2018-05-08 LAB — BASIC METABOLIC PANEL
Anion gap: 8 (ref 5–15)
BUN: 10 mg/dL (ref 8–23)
CO2: 31 mmol/L (ref 22–32)
Calcium: 8.4 mg/dL — ABNORMAL LOW (ref 8.9–10.3)
Chloride: 96 mmol/L — ABNORMAL LOW (ref 98–111)
Creatinine, Ser: 0.7 mg/dL (ref 0.61–1.24)
GFR calc Af Amer: >60 mL/min (ref >60)
GFR calc non Af Amer: >60 mL/min (ref >60)
Glucose, Bld: 124 mg/dL — ABNORMAL HIGH (ref 70–99)
POTASSIUM: 3.3 mmol/L — AB (ref 3.5–5.1)
Sodium: 135 mmol/L (ref 135–145)

## 2018-05-08 LAB — CBC
HCT: 43.8 % (ref 39.0–52.0)
HEMOGLOBIN: 13.3 g/dL (ref 13.0–17.0)
MCH: 28.7 pg (ref 26.0–34.0)
MCHC: 30.4 g/dL (ref 30.0–36.0)
MCV: 94.6 fL (ref 80.0–100.0)
Platelets: 125 10*3/uL — ABNORMAL LOW (ref 150–400)
RBC: 4.63 MIL/uL (ref 4.22–5.81)
RDW: 15.1 % (ref 11.5–15.5)
WBC: 7.2 10*3/uL (ref 4.0–10.5)
nRBC: 0 % (ref 0.0–0.2)

## 2018-05-08 LAB — INFLUENZA PANEL BY PCR (TYPE A & B)
Influenza A By PCR: NEGATIVE
Influenza B By PCR: NEGATIVE

## 2018-05-08 LAB — TROPONIN I: Troponin I: <0.03 ng/mL (ref <0.03)

## 2018-05-08 MED ORDER — AMOXICILLIN 500 MG PO CAPS: 500.0000 mg | ORAL_CAPSULE | Freq: Three times a day (TID) | ORAL | 0 refills | Status: DC

## 2018-05-08 MED ORDER — OXYCODONE-ACETAMINOPHEN 5-325 MG PO TABS
1.0000 | ORAL_TABLET | Freq: Once | ORAL | Status: AC
  Administered 2018-05-08: 1 via ORAL
  Filled 2018-05-08: qty 1

## 2018-05-08 MED ORDER — ALBUTEROL SULFATE (2.5 MG/3ML) 0.083% IN NEBU
5.0000 mg | INHALATION_SOLUTION | Freq: Once | RESPIRATORY_TRACT | Status: AC
  Administered 2018-05-08: 5 mg via RESPIRATORY_TRACT
  Filled 2018-05-08: qty 6

## 2018-05-08 MED ORDER — AMOXICILLIN 500 MG PO CAPS
500.0000 mg | ORAL_CAPSULE | Freq: Three times a day (TID) | ORAL | Status: DC
  Administered 2018-05-08: 500 mg via ORAL
  Filled 2018-05-08: qty 1

## 2018-05-08 MED ORDER — IPRATROPIUM-ALBUTEROL 0.5-2.5 (3) MG/3ML IN SOLN
3.0000 mL | Freq: Once | RESPIRATORY_TRACT | Status: AC
  Administered 2018-05-08: 3 mL via RESPIRATORY_TRACT
  Filled 2018-05-08: qty 3

## 2018-05-08 NOTE — ED Triage Notes (Signed)
Pt in via POV headache, sinus pressure, nasal congestion x a few days.  Reports using OTC medication without any relief.  Pt ambulatory to triage, vitals WDL, NAD noted at this time.

## 2018-05-08 NOTE — ED Provider Notes (Signed)
[facility] Emergency Department Provider Note  ____________________________________________   First MD Initiated Contact with Patient 05/08/18 2116     (approximate)  I have reviewed the triage vital signs and the nursing notes.   HISTORY  Chief Complaint URI    HPI Nicholas Reyes is a 63 y.o. male who complains of several days of facial pain nasal congestion some cough and generally feeling ill.  He is tried several over-the-counter cough and cold and congestion remedies without relief. He does not have a fever.  He is not coughing up phlegm.  Nasal drainage is yellow most of the time sometimes with a little clear  Past Medical History:  Diagnosis Date  . Arthritis   . CAD (coronary artery disease)   . COPD (chronic obstructive pulmonary disease) ([place])   . Depression   . Diabetes mellitus   . Heart disease    cad with 3 stents   . Hypertension   . MI (myocardial infarction) ([place])   . Migraines   . Psoriasis    joint pain in knees left hand     Patient Active Problem List   Diagnosis Date Noted  . Enteritis 04/10/2018  . 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 Chronic L5-S1 lumbar pars defect (Left) 03/27/2018  . Grade 1 Anterolisthesis of L5 over S1 03/27/2018  . Chronic knee pain (Right) 03/27/2018  . Chronic knee pain (Left) 03/27/2018  . Chronic patellofemoral knee pain (Left) 03/27/2018  . History of nephrolithiasis 03/27/2018  . Right flank pain 02/13/2018  . Osteoarthritis of knee (Bilateral) (R>L) 01/28/2018  . Tricompartment osteoarthritis of knee (Right) 01/28/2018  . Osteoarthritis of hands (Bilateral) 01/28/2018  . Chronic anticoagulation (PLAVIX) 01/28/2018  . Chronic knee pain (Primary Area of Pain) (Bilateral) (R>L) 01/02/2018  . Chronic hand pain (Secondary Area of Pain) (Bilateral) (L>R) 01/02/2018  . Chronic pain syndrome 01/02/2018  . Long term current use of opiate analgesic 01/02/2018  . Pharmacologic therapy 01/02/2018    . Disorder of skeletal system 01/02/2018  . Problems influencing health status 01/02/2018  . Cognitive impairment 12/11/2017  . DM2 (diabetes mellitus, type 2) ([place]) 12/04/2017  . Psoriasis 11/15/2017  . Skin sore 11/15/2017  . Diabetes mellitus without complication ([place]) 74/25/9563  . Vitamin D deficiency 11/15/2017  . Essential hypertension 06/11/2017  . Coronary artery disease involving native coronary artery of native heart without angina pectoris 06/11/2017  . Exercise-induced angina ([place]) 01/31/2016  . Hypertension 05/18/2014  . Edema 02/09/2014  . Mixed hyperlipidemia 04/02/2013  . HTN (hypertension) 04/02/2013  . Chest pain with high risk for cardiac etiology 12/12/2012  . Dyspnea 12/12/2012  . Infective otitis externa 09/25/2012  . Coronary atherosclerosis 09/02/2012  . Otalgia 09/02/2012  . Benign essential hypertension 08/02/2012  . Chronic airway obstruction, not elsewhere classified 08/02/2012  . Depressive disorder, not elsewhere classified 08/02/2012  . Family history of ischemic heart disease (IHD) 07/08/2012  . Family history of ischemic heart disease and other diseases of the circulatory system 07/08/2012  . Allergic rhinitis 07/05/2012  . Other psoriasis 07/05/2012  . Benign neoplasm of colon 09/22/2010  . Arthritis 06/09/2010  . Asthma 06/09/2010  . Diarrhea 06/09/2010  . Headaches due to old head injury 06/09/2010  . Type 2 diabetes mellitus without complications ([place]) 87/56/4332  . Abdominal pain 06/09/2010    Past Surgical History:  Procedure Laterality Date  . APPENDECTOMY    . CATARACT EXTRACTION Bilateral    inserted lens b/l eyes right 03/12/17, left 05/28/17   .  CHOLECYSTECTOMY    . CORONARY ANGIOPLASTY WITH STENT PLACEMENT     LAD, LCX-OM, ? 3rd location  . EYE SURGERY     cataract b/l   . HEMORRHOID SURGERY    . hemorrhoid surgery      x 2   . KNEE SURGERY     right knee arthroscopy, partial synovectecomy 04/21/15 and  meniscectomy/chondroplasty   . LEFT HEART CATH AND CORONARY ANGIOGRAPHY Left 01/30/2018   Procedure: LEFT HEART CATH AND CORONARY ANGIOGRAPHY;  Surgeon: Isaias Cowman, MD;  Location: [place];  Service: Cardiovascular;  Laterality: Left;  . right knee surgery     ? type of procedure   . STENT PLACEMENT VASCULAR ([place] HX)    . TONSILLECTOMY    . TONSILLECTOMY AND ADENOIDECTOMY      Prior to Admission medications   Medication Sig Start Date End Date Taking? Authorizing Provider  albuterol (PROVENTIL HFA;VENTOLIN HFA) 108 (90 BASE) MCG/ACT inhaler Inhale 2 puffs into the lungs every 4 (four) hours as needed. For shortness of breath    [provider]  amLODipine (NORVASC) 5 MG tablet Take 5 mg by mouth daily.    [provider]  aspirin 81 MG EC tablet Take 81 mg by mouth daily.     [provider]  atorvastatin (LIPITOR) 20 MG tablet Take 20 mg by mouth daily.    [provider]  budesonide-formoterol (SYMBICORT) 80-4.5 MCG/ACT inhaler Inhale 2 puffs into the lungs 2 (two) times daily.    [provider]  cephALEXin (KEFLEX) 500 MG capsule Take 1 capsule (500 mg total) by mouth 4 (four) times daily. Patient not taking: Reported on 04/22/2018 04/01/18   Jodelle Green, FNP  cetirizine (ZYRTEC) 10 MG tablet Take 10 mg by mouth daily.    [provider]  Cholecalciferol 50000 units capsule Take 1 capsule (50,000 Units total) by mouth once a week. 12/10/17   McLean-Scocuzza, Nino Glow, MD  clobetasol ointment (TEMOVATE) 4.23 % Apply 1 application topically 2 (two) times daily. Right ear as needed not face or private 04/10/18   McLean-Scocuzza, Nino Glow, MD  clopidogrel (PLAVIX) 75 MG tablet Take 75 mg by mouth daily.    [provider]  diclofenac (FLECTOR) 1.3 % PTCH Place 1 patch onto the skin 2 (two) times daily. 11/30/17   McLean-Scocuzza, Nino Glow, MD  doxycycline (VIBRA-TABS) 100 MG tablet Take 1 tablet (100 mg total) by  mouth 2 (two) times daily. With food Patient not taking: Reported on 04/22/2018 12/04/17   McLean-Scocuzza, Nino Glow, MD  DULoxetine (CYMBALTA) 60 MG capsule Take 1 capsule (60 mg total) by mouth daily. 01/11/18   McLean-Scocuzza, Nino Glow, MD  furosemide (LASIX) 40 MG tablet Take 40 mg by mouth daily.    [provider]  hydrOXYzine (ATARAX/VISTARIL) 25 MG tablet Take 1 tablet (25 mg total) by mouth 3 (three) times daily as needed. 11/28/17   Triplett, Johnette Abraham B, FNP  isosorbide mononitrate (IMDUR) 30 MG 24 hr tablet Take 1 tablet (30 mg total) by mouth daily. 01/11/18   McLean-Scocuzza, Nino Glow, MD  ketorolac (ACULAR) 0.5 % ophthalmic solution  12/03/17   [provider]  meloxicam (MOBIC) 7.5 MG tablet Take 1 tablet (7.5 mg total) by mouth 2 (two) times daily as needed for pain. 12/31/17   McLean-Scocuzza, Nino Glow, MD  metFORMIN (GLUCOPHAGE) 500 MG tablet Take 1 tablet (500 mg total) by mouth daily with breakfast. 11/23/17   McLean-Scocuzza, Nino Glow, MD  metoprolol tartrate (LOPRESSOR) 25 MG tablet Take 25 mg by mouth 2 (two) times daily.    [provider]  montelukast (SINGULAIR) 10 MG tablet Take 10 mg by mouth at bedtime.    [provider]  mupirocin ointment (BACTROBAN) 2 % Apply 1 application topically 2 (two) times daily. Right ear 04/10/18   McLean-Scocuzza, Nino Glow, MD  nitroGLYCERIN (NITROSTAT) 0.4 MG SL tablet Place 0.4 mg under the tongue every 5 (five) minutes as needed. For chest pain    [provider]  omeprazole (PRILOSEC) 40 MG capsule Take 1 capsule (40 mg total) by mouth daily. 04/11/18 06/12/18  Jonathon Bellows, MD  ondansetron (ZOFRAN) 4 MG tablet Take 1 tablet (4 mg total) by mouth every 8 (eight) hours as needed for nausea or vomiting. 12/19/17   Guse, Jacquelynn Cree, FNP  oxyCODONE-acetaminophen (PERCOCET) 5-325 MG tablet Take 1 tablet by mouth every 4 (four) hours as needed for severe pain. Patient not taking: Reported on 04/22/2018 04/09/18 04/09/19   Nena Polio, MD  pantoprazole (PROTONIX) 20 MG tablet TAKE 1 TABLET EVERY DAY 04/29/18   Burnard Hawthorne, FNP  prednisoLONE acetate (PRED FORTE) 1 % ophthalmic suspension  11/30/17   [provider]  ranitidine (ZANTAC) 150 MG/10ML syrup 150 mg 2 (two) times daily. Taking 10 ml bid    [provider]  Secukinumab (COSENTYX 300 DOSE [place]) Inject into the skin every 30 (thirty) days.    [provider]  spironolactone (ALDACTONE) 25 MG tablet Take 1 tablet (25 mg total) by mouth daily. In am for blood pressure 05/03/18   McLean-Scocuzza, Nino Glow, MD  sucralfate (CARAFATE) 1 g tablet Take 1 tablet (1 g total) by mouth 4 (four) times daily -  with meals and at bedtime. Patient not taking: Reported on 04/11/2018 02/24/18 02/24/19  Merlyn Lot, MD  triamcinolone cream (KENALOG) 0.1 % Apply 1 application topically 2 (two) times daily. Not face, groin, underarms 11/15/17   McLean-Scocuzza, Nino Glow, MD    Allergies Patient has no known allergies.  Family History  Problem Relation Age of Onset  . Cancer Mother   . Heart disease Father   . Stroke Father     Social History Social History   Tobacco Use  . Smoking status: Former Research scientist (life sciences)  . Smokeless tobacco: Never Used  Substance Use Topics  . Alcohol use: No    Frequency: Never  . Drug use: No    Review of Systems Constitutional: No fever/chills Eyes: No visual changes. ENT: No sore throat. Cardiovascular: Denies chest pain. Respiratory: Denies shortness of breath. Gastrointestinal: No abdominal pain.  No nausea, no vomiting.  No diarrhea.  No constipation. Genitourinary: Negative for dysuria. Musculoskeletal: Negative for back pain. Skin: Negative for rash. Neurological: Negative for headaches, focal weakness  ____________________________________________   PHYSICAL EXAM:  VITAL SIGNS: ED Triage Vitals  Enc Vitals Group     BP 05/08/18 2003 138/62     Pulse Rate 05/08/18 2003 88     Resp  05/08/18 2003 20     Temp 05/08/18 2003 98.2 F (36.8 C)     Temp Source 05/08/18 2003 Oral     SpO2 05/08/18 2003 96 %     Weight 05/08/18 2015 240 lb (108.9 kg)     Height 05/08/18 2015 5\' 7"  (1.702 m)     Head Circumference --      Peak Flow --      Pain Score 05/08/18 2015 10  Pain Loc --      Pain Edu? --      Excl. in [place]? --     Constitutional: Alert and oriented. Ill appearing but in no acute distress. Eyes: Conjunctivae are normal.  Head: Atraumatic.  Do over both maxillary sinuses Nose:  Congestion/no rhinnorhea. Mouth/Throat: Mucous membranes are moist.  Oropharynx non-erythematous. Neck: No stridor.  Cardiovascular: Normal rate, regular rhythm. Grossly normal heart sounds.  Good peripheral circulation. Respiratory: Normal respiratory effort.  No retractions. Lungs suggestion of a slight wheeze Gastrointestinal: Soft and nontender. No distention. No abdominal bruits. No CVA tenderness. Musculoskeletal: No lower extremity tenderness nor edema. . Neurologic:  Normal speech and language. No gross focal neurologic deficits are appreciated. Skin:  Skin is warm, dry and intact. No rash noted. Psychiatric: Mood and affect are normal. Speech and behavior are normal.  ____________________________________________   LABS (all labs ordered are listed, but only abnormal results are displayed)  Labs Reviewed  BASIC METABOLIC PANEL - Abnormal; Notable for the following components:      Result Value   Potassium 3.3 (*)    Chloride 96 (*)    Glucose, Bld 124 (*)    Calcium 8.4 (*)    All other components within normal limits  CBC - Abnormal; Notable for the following components:   Platelets 125 (*)    All other components within normal limits  TROPONIN I  INFLUENZA PANEL BY PCR (TYPE A & B)   ____________________________________________  EKG  EKG read and interpreted by me shows normal sinus rhythm rate of 89 normal axis no acute ST-T wave  changes ____________________________________________  RADIOLOGY  ED MD interpretation: X-ray read by radiology reviewed by me shows no acute disease  Official radiology report(s): Dg Chest 2 View  Result Date: 05/08/2018 CLINICAL DATA:  Sinus pressure and nasal congestion for few days. EXAM: CHEST - 2 VIEW COMPARISON:  January 22, 2018 FINDINGS: The heart size and mediastinal contours are stable. Prominent right pericardial fat pad is identified unchanged. There is no focal infiltrate, pulmonary edema, or pleural effusion. Degenerative joint changes of the spine are noted. IMPRESSION: No active cardiopulmonary disease. Electronically Signed   By: Abelardo Diesel M.D.   On: 05/08/2018 21:07    ____________________________________________   PROCEDURES  Procedure(s) performed:   Procedures  Critical Care performed:   ____________________________________________   INITIAL IMPRESSION / ASSESSMENT AND PLAN / ED COURSE  Patient appears to have sinusitis.  He looks fairly ill.  I will get him some amoxicillin 503 times a day.  Still waiting on a flu screen to come back. ----------------------------------------- 10:23 PM on 05/08/2018 -----------------------------------------  Flu is negative we will plan on discharging patient Please note the computers warning given interaction between amoxicillin and doxycycline but patient is not taking doxycycline anymore.       ____________________________________________   FINAL CLINICAL IMPRESSION(S) / ED DIAGNOSES  Final diagnoses:  None     ED Discharge Orders    None       Note:  This document was prepared using Dragon voice recognition software and may include unintentional dictation errors.    Nena Polio, MD 05/08/18 2225

## 2018-05-08 NOTE — Discharge Instructions (Addendum)
Get some salt water nasal spray and use that 5 or 6 times a day to help drain the sinuses.  Take the amoxicillin 1 pill 3 times a day.  Return for worse pain fever or feeling sicker or if you not any better in 3 or 4 more days.  I would not use the decongestants for more than another day or 2.  If you continue to use them sometimes when you stop you will have a rebound reaction and everything will get worse.

## 2018-05-09 ENCOUNTER — Encounter: Payer: Self-pay | Admitting: *Deleted

## 2018-05-09 ENCOUNTER — Other Ambulatory Visit: Payer: Self-pay | Admitting: *Deleted

## 2018-05-09 NOTE — Patient Outreach (Signed)
[facility]) Care Management  05/09/2018  SIDDARTH HSIUNG 1955/12/27 [id number]   [place] ED Visits  Referral Date:02/25/2018 Referral Source:[facility] ED Census Reason for Referral:6 or more ED visits in the past 6 months Insurance:[employer] Medicare & Medicaid   Outreach Attempt:  Received call back from patient.  HIPAA verified with patient.  Patient reporting recent emergency room visit yesterday for shortness of breath.  Stated he was prescribed an antibiotic and since taking the first dose of antibiotic he has started to feel better, is less short of breath.  Encouraged to complete antibiotics as prescribed.  Discussed with patient, trying to contact primary care provider with concerns, prior to going to emergency rooms.  States he understanding and states he has been trying to do so.  Reports the loss of his brother this past Sunday and patient is requesting something for his anxiety.  Encouraged patient to contact provider with request and to discuss at upcoming appointment next week.  Appointments:  Patient has scheduled medical appointment on 05/15/2047 with primary care provider.  Encouraged patient to keep and attend medical appointment.  Plan:  RN Health Coach will make next telephone outreach to patient with in the month of February.  [place] [phone] [email or web address] [email or web address]

## 2018-05-09 NOTE — Patient Outreach (Signed)
[facility]) Care Management  05/09/2018  AMORY SIMONETTI [birth year] [id number]   [place] ED Visits  Referral Date:02/25/2018 Referral Source:[facility] ED Census Reason for Referral:6 or more ED visits in the past 6 months Insurance:[employer] Medicare & Medicaid   Outreach Attempt:  Outreach attempt #1 to patient for post emergency room visits follow up. No answer and unable to leave voicemail message due to voice mailbox being full.  Plan:  RN Health Coach will attempt another outreach within the month of January.  [place] [phone] [email or web address] [email or web address]

## 2018-05-12 ENCOUNTER — Encounter: Payer: Self-pay | Admitting: Gastroenterology

## 2018-05-14 ENCOUNTER — Ambulatory Visit (INDEPENDENT_AMBULATORY_CARE_PROVIDER_SITE_OTHER): Payer: Medicare HMO | Admitting: Internal Medicine

## 2018-05-14 ENCOUNTER — Encounter: Payer: Self-pay | Admitting: Internal Medicine

## 2018-05-14 ENCOUNTER — Ambulatory Visit (INDEPENDENT_AMBULATORY_CARE_PROVIDER_SITE_OTHER): Payer: Medicare HMO

## 2018-05-14 ENCOUNTER — Ambulatory Visit: Payer: Medicare HMO | Admitting: Pain Medicine

## 2018-05-14 VITALS — BP 128/62 | HR 76 | Temp 97.5°F | Ht 67.0 in

## 2018-05-14 DIAGNOSIS — J449 Chronic obstructive pulmonary disease, unspecified: Secondary | ICD-10-CM | POA: Diagnosis not present

## 2018-05-14 DIAGNOSIS — R1011 Right upper quadrant pain: Secondary | ICD-10-CM

## 2018-05-14 DIAGNOSIS — L299 Pruritus, unspecified: Secondary | ICD-10-CM

## 2018-05-14 DIAGNOSIS — J309 Allergic rhinitis, unspecified: Secondary | ICD-10-CM

## 2018-05-14 DIAGNOSIS — I2510 Atherosclerotic heart disease of native coronary artery without angina pectoris: Secondary | ICD-10-CM

## 2018-05-14 DIAGNOSIS — R0789 Other chest pain: Secondary | ICD-10-CM

## 2018-05-14 DIAGNOSIS — J069 Acute upper respiratory infection, unspecified: Secondary | ICD-10-CM

## 2018-05-14 DIAGNOSIS — F4321 Adjustment disorder with depressed mood: Secondary | ICD-10-CM | POA: Diagnosis not present

## 2018-05-14 DIAGNOSIS — R55 Syncope and collapse: Secondary | ICD-10-CM

## 2018-05-14 DIAGNOSIS — R0781 Pleurodynia: Secondary | ICD-10-CM | POA: Diagnosis not present

## 2018-05-14 MED ORDER — ALBUTEROL SULFATE HFA 108 (90 BASE) MCG/ACT IN AERS: 2.0000 | INHALATION_SPRAY | RESPIRATORY_TRACT | 4 refills | Status: DC | PRN

## 2018-05-14 MED ORDER — FLUTICASONE PROPIONATE 50 MCG/ACT NA SUSP: 2.0000 | Freq: Every day | NASAL | 6 refills | Status: AC

## 2018-05-14 MED ORDER — BUDESONIDE-FORMOTEROL FUMARATE 160-4.5 MCG/ACT IN AERO: 2.0000 | INHALATION_SPRAY | Freq: Two times a day (BID) | RESPIRATORY_TRACT | 12 refills | Status: AC

## 2018-05-14 MED ORDER — OXYCODONE-ACETAMINOPHEN 5-325 MG PO TABS: 1.0000 | ORAL_TABLET | Freq: Two times a day (BID) | ORAL | 0 refills | Status: DC | PRN

## 2018-05-14 MED ORDER — HYDROXYZINE HCL 25 MG PO TABS: 25.0000 mg | ORAL_TABLET | Freq: Three times a day (TID) | ORAL | 0 refills | Status: AC | PRN

## 2018-05-14 NOTE — Patient Instructions (Addendum)
Stop Amoxicillin antibiotic   We referred to cardiology Dr. Josefa Half you need clearance before stomach doctors can do the procedure (look down your throat and in your gut/intestines    You can try zyrtec please take at night because it can make you sleepy but do not mix with hydroxyzine or atarax. You can take one of the other     Upper Respiratory Infection, Adult An upper respiratory infection (URI) affects the nose, throat, and upper air passages. URIs are caused by germs (viruses). The most common type of URI is often called "the common cold." Medicines cannot cure URIs, but you can do things at home to relieve your symptoms. URIs usually get better within 7-10 days. Follow these instructions at home: Activity  Rest as needed.  If you have a fever, stay home from work or school until your fever is gone, or until your doctor says you may return to work or school. ? You should stay home until you cannot spread the infection anymore (you are not contagious). ? Your doctor may have you wear a face mask so you have less risk of spreading the infection. Relieving symptoms  Gargle with a salt-water mixture 3-4 times a day or as needed. To make a salt-water mixture, completely dissolve -1 tsp of salt in 1 cup of warm water.  Use a cool-mist humidifier to add moisture to the air. This can help you breathe more easily. Eating and drinking   Drink enough fluid to keep your pee (urine) pale yellow.  Eat soups and other clear broths. General instructions   Take over-the-counter and prescription medicines only as told by your doctor. These include cold medicines, fever reducers, and cough suppressants.  Do not use any products that contain nicotine or tobacco. These include cigarettes and e-cigarettes. If you need help quitting, ask your doctor.  Avoid being where people are smoking (avoid secondhand smoke).  Make sure you get regular shots and get the flu shot every year.  Keep all  follow-up visits as told by your doctor. This is important. How to avoid spreading infection to others   Wash your hands often with soap and water. If you do not have soap and water, use hand sanitizer.  Avoid touching your mouth, face, eyes, or nose.  Cough or sneeze into a tissue or your sleeve or elbow. Do not cough or sneeze into your hand or into the air. Contact a doctor if:  You are getting worse, not better.  You have any of these: ? A fever. ? Chills. ? Brown or red mucus in your nose. ? Yellow or brown fluid (discharge)coming from your nose. ? Pain in your face, especially when you bend forward. ? Swollen neck glands. ? Pain with swallowing. ? White areas in the back of your throat. Get help right away if:  You have shortness of breath that gets worse.  You have very bad or constant: ? Headache. ? Ear pain. ? Pain in your forehead, behind your eyes, and over your cheekbones (sinus pain). ? Chest pain.  You have long-lasting (chronic) lung disease along with any of these: ? Wheezing. ? Long-lasting cough. ? Coughing up blood. ? A change in your usual mucus.  You have a stiff neck.  You have changes in your: ? Vision. ? Hearing. ? Thinking. ? Mood. Summary  An upper respiratory infection (URI) is caused by a germ called a virus. The most common type of URI is often called "the common cold."  URIs usually get better within 7-10 days.  Take over-the-counter and prescription medicines only as told by your doctor. This information is not intended to replace advice given to you by your health care provider. Make sure you discuss any questions you have with your health care provider. Document Released: 10/11/2007 Document Revised: 12/15/2016 Document Reviewed: 12/15/2016 Elsevier Interactive Patient Education  2019 [employer].

## 2018-05-14 NOTE — Progress Notes (Signed)
Pre visit review using our clinic review tool, if applicable. No additional management support is needed unless otherwise documented below in the visit note. 

## 2018-05-15 ENCOUNTER — Encounter: Payer: Self-pay | Admitting: Internal Medicine
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
## 2018-05-17 ENCOUNTER — Telehealth: Payer: Self-pay

## 2018-05-17 NOTE — Telephone Encounter (Signed)
Called pt to inform him of MRI results and Dr. Georgeann Oppenheim suggestions. Unable to contact, LVM to return call

## 2018-05-17 NOTE — Telephone Encounter (Signed)
-----   Message from Jonathon Bellows, MD sent at 05/12/2018  5:30 PM EST ----- Sherald Hess inform MR enterogram shows now crohns disease. IF still has symptoms suggest EGD and colonoscopy as per last office note- can schedule.   C/c McLean-Scocuzza, Nino Glow, MD   Dr Jonathon Bellows MD,MRCP [facility]) Gastroenterology/Hepatology Pager: [phone]

## 2018-05-20 ENCOUNTER — Ambulatory Visit (INDEPENDENT_AMBULATORY_CARE_PROVIDER_SITE_OTHER): Payer: Medicare HMO | Admitting: Urology

## 2018-05-20 ENCOUNTER — Ambulatory Visit: Payer: Self-pay

## 2018-05-20 ENCOUNTER — Encounter: Payer: Self-pay | Admitting: Urology

## 2018-05-20 VITALS — BP 105/65 | HR 87 | Ht 68.0 in | Wt 233.0 lb

## 2018-05-20 DIAGNOSIS — N281 Cyst of kidney, acquired: Secondary | ICD-10-CM | POA: Diagnosis not present

## 2018-05-20 NOTE — Telephone Encounter (Signed)
Tried to call patient mailbox is full cannot leave message could not leave message.

## 2018-05-20 NOTE — Telephone Encounter (Signed)
Pt. Called to report severe constant pain in right side, just below his rib cage.  Denied radiation of the pain.  Denied ant. Chest pain or shortness of breath.  Denied fever/ chills, or nausea/ vomiting.   Stated the pain has been worse since last night.  Pt. Very irritable during assessment questions.  Stated "It hurts all the time!"  Advised, due to severity of pain, he should go to ER.  Stated he has no transportation. Refused to call 911, stating "I don't have any way to get home from the hospital." Requested nurse contact his sister, Blanch Media, in [place].  Attempted to contact pt's sister @ [phone]; left voice message to call office to discuss her brother, and need to be taken to hospital.  Advised pt. That an attempt was made to contact his sister.  (Noted pt. had an appt. at [facility] today, at 2:00 PM, for eval. Of bilateral renal cysts.  Per the progress note at [facility], the pt. denied any flank pain.)    Will send note to PCP re: pt. Declining going to ER due to no transportation.      Reason for Disposition . [1] SEVERE pain (e.g., excruciating, scale 8-10) AND [2] present > 1 hour  Answer Assessment - Initial Assessment Questions 1. LOCATION: "Where does it hurt?"       Right side just below the rib cage  2. RADIATION: "Does the pain go anywhere else?" (e.g., into neck, jaw, arms, back)     Denied  3. ONSET: "When did the chest pain begin?" (Minutes, hours or days)      It got worse last night  4. PATTERN "Does the pain come and go, or has it been constant since it started?"  "Does it get worse with exertion?"      constant 5. DURATION: "How long does it last" (e.g., seconds, minutes, hours)     constant 6. SEVERITY: "How bad is the pain?"  (e.g., Scale 1-10; mild, moderate, or severe)    - MILD (1-3): doesn't interfere with normal activities     - MODERATE (4-7): interferes with normal activities or awakens from sleep    - SEVERE (8-10): excruciating pain, unable to  do any normal activities       8-9/10 7. CARDIAC RISK FACTORS: "Do you have any history of heart problems or risk factors for heart disease?" (e.g., prior heart attack, angina; high blood pressure, diabetes, being overweight, high cholesterol, smoking, or strong family history of heart disease)     *No Answer* 8. PULMONARY RISK FACTORS: "Do you have any history of lung disease?"  (e.g., blood clots in lung, asthma, emphysema, birth control pills)     It hurts all the time.  9. CAUSE: "What do you think is causing the chest pain?"    Unknown 10. OTHER SYMPTOMS: "Do you have any other symptoms?" (e.g., dizziness, nausea, vomiting, sweating, fever, difficulty breathing, cough)       Denied fever/ chills; denied nausea or vomiting, no sweating, denied shortness of breath,  11. PREGNANCY: "Is there any chance you are pregnant?" "When was your last menstrual period?"       N/a  Answer Assessment - Initial Assessment Questions 1. LOCATION: "Where does it hurt?" (e.g., left, right)     Right side below rib cage 2. ONSET: "When did the pain start?"     It got worse last night 3. SEVERITY: "How bad is the pain?" (e.g., Scale 1-10; mild, moderate, or severe)   -  MILD (1-3): doesn't interfere with normal activities    - MODERATE (4-7): interferes with normal activities or awakens from sleep    - SEVERE (8-10): excruciating pain and patient unable to do normal activities (stays in bed)       8-9/10 4. PATTERN: "Does the pain come and go, or is it constant?"      constant 5. CAUSE: "What do you think is causing the pain?"     unknown 6. OTHER SYMPTOMS:  "Do you have any other symptoms?" (e.g., fever, abdominal pain, vomiting, leg weakness, burning with urination, blood in urine)     Denied nausea/ vomiting, fever/ chills, denied any urinary symptoms 7. PREGNANCY:  "Is there any chance you are pregnant?" "When was your last menstrual period?"     N/a  Protocols used: FLANK PAIN-A-AH, CHEST  PAIN-A-AH

## 2018-05-20 NOTE — Progress Notes (Signed)
05/20/2018 2:32 PM   Nicholas Reyes 02/26/1956 [id number]  Referring provider: McLean-Scocuzza, Nino Glow, MD [place], [place] [id number]  CC: Renal cysts  HPI: I saw Nicholas Reyes in urology clinic in consultation for bilateral simple renal cysts from Dr. Terese Door.  He is a 63 year old male with numerous comorbidities including CAD on anticoagulation, COPD, depression, and diabetes.  On recent CT abdomen pelvis with contrast 04/09/2018 for mild abdominal pain he was found to have multiple small, stable, simple renal cysts bilaterally.  There was no hydronephrosis or urolithiasis.  He denies any gross hematuria or flank pain.  There is no history of nephrolithiasis.  He is voiding well and denies any urinary complaints.  There are no aggravating or alleviating factors.  There is no history of microscopic hematuria.  Severity is mild.  He is a smoker.   PMH: Past Medical History:  Diagnosis Date  . Arthritis   . CAD (coronary artery disease)   . COPD (chronic obstructive pulmonary disease) ([facility])   . Depression   . Diabetes mellitus   . Heart disease    cad with 3 stents   . Hypertension   . MI (myocardial infarction) ([facility])   . Migraines   . Psoriasis    joint pain in knees left hand     Surgical History: Past Surgical History:  Procedure Laterality Date  . APPENDECTOMY    . CATARACT EXTRACTION Bilateral    inserted lens b/l eyes right 03/12/17, left 05/28/17   . CHOLECYSTECTOMY    . CORONARY ANGIOPLASTY WITH STENT PLACEMENT     LAD, [facility]X-OM, ? 3rd location  . EYE SURGERY     cataract b/l   . HEMORRHOID SURGERY    . hemorrhoid surgery      x 2   . KNEE SURGERY     right knee arthroscopy, partial synovectecomy 04/21/15 and meniscectomy/chondroplasty   . LEFT HEART CATH AND CORONARY ANGIOGRAPHY Left 01/30/2018   Procedure: LEFT HEART CATH AND CORONARY ANGIOGRAPHY;  Surgeon: Isaias Cowman, MD;  Location: [facility];  Service: Cardiovascular;   Laterality: Left;  . right knee surgery     ? type of procedure   . STENT PLACEMENT VASCULAR ([place] HX)    . TONSILLECTOMY    . TONSILLECTOMY AND ADENOIDECTOMY      Allergies: No Known Allergies  Family History: Family History  Problem Relation Age of Onset  . Cancer Mother   . Heart disease Father   . Stroke Father   . Heart disease Brother     Social History:  reports that he has quit smoking. He has never used smokeless tobacco. He reports that he does not drink alcohol or use drugs.  ROS: Please see flowsheet from today's date for complete review of systems.  Physical Exam: BP 105/65   Pulse 87   Ht 5' 8"  (1.727 m)   Wt 233 lb (105.7 kg)   BMI 35.43 kg/m    Constitutional:  Disheveled Cardiovascular: No clubbing, cyanosis, or edema. Respiratory: Normal respiratory effort, no increased work of breathing. GI: Abdomen is soft, nontender, nondistended, no abdominal masses GU: No CVA tenderness Lymph: No cervical or inguinal lymphadenopathy. Skin: No rashes, bruises or suspicious lesions. Neurologic: Grossly intact, no focal deficits, moving all 4 extremities. Psychiatric: Normal mood and affect.  Laboratory Data: Creatinine 0.7, EGFR greater than 60 No history of microscopic hematuria   Pertinent Imaging: I have personally reviewed the CT abdomen pelvis with contrast dated 04/09/2018.  Very small, stable, simple renal cyst bilaterally.  No hydronephrosis, urolithiasis, or renal masses.  Assessment & Plan:   In summary, Nicholas Reyes is a co-morbid 63 year old male incidentally found to have bilateral small, simple, stable renal cysts.  These do not require further work-up.  I provided reassurance that greater than 50% of patients over 50 will have simple renal cyst.  These do not need surveillance with follow-up imaging.  Renal cysts very rarely cause patients any symptoms or pain, and typically this is only if they enlarge greater than 10 cm.  He has no hematuria or  urinary symptoms that would warrant any further work-up.  Follow-up as needed  Billey Co, MD  [facility] [address] [place], [place] [address] [phone]

## 2018-05-21 ENCOUNTER — Ambulatory Visit: Payer: Medicare HMO | Admitting: Pain Medicine

## 2018-05-21 NOTE — Telephone Encounter (Signed)
I think he needs appt with GI who he is established for abdominal pain  Please schedule  TMS

## 2018-05-21 NOTE — Progress Notes (Deleted)
Patient's Name: Nicholas Reyes  MRN: [id number]  Referring Provider: Milinda Pointer, MD  DOB: 08/02/1955  PCP: McLean-Scocuzza, Nino Glow, MD  DOS: 05/21/2018  Note by: Gaspar Cola, MD  Service setting: Ambulatory outpatient  Specialty: Interventional Pain Management  Patient type: Established  Location: [facility] (AMB) Pain Management Facility  Visit type: Interventional Procedure   Primary Reason for Visit: Interventional Pain Management Treatment. CC: No chief complaint on file.  Procedure:          Anesthesia, Analgesia, Anxiolysis:  Type: Therapeutic Intra-Articular Hyalgan Knee Injection #5  Region: Lateral infrapatellar Knee Region Level: Knee Joint Laterality: Bilateral  Type: Local Anesthesia Indication(s): Analgesia         Local Anesthetic: Lidocaine 1-2% Route: Infiltration ([place]/IM) IV Access: Declined Sedation: Declined   Position: Sitting   Indications: 1. 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 Chronic knee pain (Primary Area of Pain) (Bilateral) (R>L)    Pain Score: Pre-procedure:  /10 Post-procedure:  /10  Pre-op Assessment:  Nicholas Reyes is a 63 y.o. (year old), male patient, seen today for interventional treatment. He  has a past surgical history that includes Cataract extraction (Bilateral); Cholecystectomy; Appendectomy; Tonsillectomy; STENT PLACEMENT VASCULAR ([place] HX); right knee surgery; Tonsillectomy and adenoidectomy; hemorrhoid surgery ; Eye surgery; Coronary angioplasty with stent; Hemorrhoid surgery; Knee surgery; and LEFT HEART CATH AND CORONARY ANGIOGRAPHY (Left, 01/30/2018). Nicholas Reyes has a current medication list which includes the following prescription(s): albuterol, amlodipine, aspirin, atorvastatin, budesonide-formoterol, cetirizine, cholecalciferol, clobetasol ointment, clopidogrel, diclofenac, duloxetine, fluticasone, furosemide, hydroxyzine, isosorbide mononitrate, ketorolac, meloxicam, metformin, metoprolol tartrate, montelukast, mupirocin  ointment, nitroglycerin, omeprazole, ondansetron, oxycodone-acetaminophen, pantoprazole, prednisolone acetate, secukinumab, spironolactone, sucralfate, and triamcinolone cream. His primarily concern today is the No chief complaint on file.  Initial Vital Signs:  Pulse/HCG Rate:    Temp:   Resp:   BP:   SpO2:    BMI: Estimated body mass index is 35.43 kg/m as calculated from the following:   Height as of 05/20/18: 5\' 8"  (1.727 m).   Weight as of 05/20/18: 233 lb (105.7 kg).  Risk Assessment: Allergies: Reviewed. He has No Known Allergies.  Allergy Precautions: None required Coagulopathies: Reviewed. None identified.  Blood-thinner therapy: None at this time Active Infection(s): Reviewed. None identified. Nicholas Reyes is afebrile  Site Confirmation: Nicholas Reyes was asked to confirm the procedure and laterality before marking the site Procedure checklist: Completed Consent: Before the procedure and under the influence of no sedative(s), amnesic(s), or anxiolytics, the patient was informed of the treatment options, risks and possible complications. To fulfill our ethical and legal obligations, as recommended by the American Medical Association's Code of Ethics, I have informed the patient of my clinical impression; the nature and purpose of the treatment or procedure; the risks, benefits, and possible complications of the intervention; the alternatives, including doing nothing; the risk(s) and benefit(s) of the alternative treatment(s) or procedure(s); and the risk(s) and benefit(s) of doing nothing. The patient was provided information about the general risks and possible complications associated with the procedure. These may include, but are not limited to: failure to achieve desired goals, infection, bleeding, organ or nerve damage, allergic reactions, paralysis, and death. In addition, the patient was informed of those risks and complications associated to the procedure, such as failure to  decrease pain; infection; bleeding; organ or nerve damage with subsequent damage to sensory, motor, and/or autonomic systems, resulting in permanent pain, numbness, and/or weakness of one or several areas of the body; allergic reactions; (i.e.: anaphylactic reaction);  and/or death. Furthermore, the patient was informed of those risks and complications associated with the medications. These include, but are not limited to: allergic reactions (i.e.: anaphylactic or anaphylactoid reaction(s)); adrenal axis suppression; blood sugar elevation that in diabetics may result in ketoacidosis or comma; water retention that in patients with history of congestive heart failure may result in shortness of breath, pulmonary edema, and decompensation with resultant heart failure; weight gain; swelling or edema; medication-induced neural toxicity; particulate matter embolism and blood vessel occlusion with resultant organ, and/or nervous system infarction; and/or aseptic necrosis of one or more joints. Finally, the patient was informed that Medicine is not an exact science; therefore, there is also the possibility of unforeseen or unpredictable risks and/or possible complications that may result in a catastrophic outcome. The patient indicated having understood very clearly. We have given the patient no guarantees and we have made no promises. Enough time was given to the patient to ask questions, all of which were answered to the patient's satisfaction. Nicholas Reyes has indicated that he wanted to continue with the procedure. Attestation: I, the ordering provider, attest that I have discussed with the patient the benefits, risks, side-effects, alternatives, likelihood of achieving goals, and potential problems during recovery for the procedure that I have provided informed consent. Date  Time: {CHL [facility] TIME CHOICES:21018001}  Pre-Procedure Preparation:  Monitoring: As per clinic protocol. Respiration, ETCO2, SpO2, BP,  heart rate and rhythm monitor placed and checked for adequate function Safety Precautions: Patient was assessed for positional comfort and pressure points before starting the procedure. Time-out: I initiated and conducted the "Time-out" before starting the procedure, as per protocol. The patient was asked to participate by confirming the accuracy of the "Time Out" information. Verification of the correct person, site, and procedure were performed and confirmed by me, the nursing staff, and the patient. "Time-out" conducted as per Joint Commission's Universal Protocol (UP.01.01.01). Time:    Description of Procedure:          Target Area: Knee Joint Approach: Just above the Lateral tibial plateau, lateral to the infrapatellar tendon. Area Prepped: Entire knee area, from the mid-thigh to the mid-shin. Prepping solution: ChloraPrep (2% chlorhexidine gluconate and 70% isopropyl alcohol) Safety Precautions: Aspiration looking for blood return was conducted prior to all injections. At no point did we inject any substances, as a needle was being advanced. No attempts were made at seeking any paresthesias. Safe injection practices and needle disposal techniques used. Medications properly checked for expiration dates. SDV (single dose vial) medications used. Description of the Procedure: Protocol guidelines were followed. The patient was placed in position over the fluoroscopy table. The target area was identified and the area prepped in the usual manner. Skin & deeper tissues infiltrated with local anesthetic. Appropriate amount of time allowed to pass for local anesthetics to take effect. The procedure needles were then advanced to the target area. Proper needle placement secured. Negative aspiration confirmed. Solution injected in intermittent fashion, asking for systemic symptoms every 0.5cc of injectate. The needles were then removed and the area cleansed, making sure to leave some of the prepping solution  back to take advantage of its long term bactericidal properties. There were no vitals filed for this visit.  Start Time:   hrs. End Time:   hrs. Materials:  Needle(s) Type: Regular needle Gauge: 25G Length: 1.5-in Medication(s): Please see orders for medications and dosing details.  Imaging Guidance:          Type of Imaging Technique: None used  Indication(s): N/A Exposure Time: No patient exposure Contrast: None used. Fluoroscopic Guidance: N/A Ultrasound Guidance: N/A Interpretation: N/A  Antibiotic Prophylaxis:   Anti-infectives (From admission, onward)   None     Indication(s): None identified  Post-operative Assessment:  Post-procedure Vital Signs:  Pulse/HCG Rate:    Temp:   Resp:   BP:   SpO2:    EBL: None  Complications: No immediate post-treatment complications observed by team, or reported by patient.  Note: The patient tolerated the entire procedure well. A repeat set of vitals were taken after the procedure and the patient was kept under observation following institutional policy, for this type of procedure. Post-procedural neurological assessment was performed, showing return to baseline, prior to discharge. The patient was provided with post-procedure discharge instructions, including a section on how to identify potential problems. Should any problems arise concerning this procedure, the patient was given instructions to immediately contact us, at any time, without hesitation. In any case, we plan to contact the patient by telephone for a follow-up status report regarding this interventional procedure.  Comments:  No additional relevant information.  Plan of Care   Imaging Orders  No imaging studies ordered today   Procedure Orders    No procedure(s) ordered today    Medications ordered for procedure: No orders of the defined types were placed in this encounter.  Medications administered: Haskell Riling had no medications administered during  this visit.  See the medical record for exact dosing, route, and time of administration.  Disposition: Discharge home  Discharge Date & Time: 05/21/2018;   hrs.   Physician-requested Follow-up: No follow-ups on file.  Future Appointments  Date Time Provider [place]  05/21/2018  1:15 PM Milinda Pointer, MD [facility] None  05/28/2018  2:15 PM Jonathon Bellows, MD [facility] None  06/11/2018  9:00 AM Leona Singleton, RN [facility] None  07/10/2018 11:00 AM McLean-Scocuzza, Nino Glow, MD [facility]   Primary Care Physician: McLean-Scocuzza, Nino Glow, MD Location: [facility] Note by: Gaspar Cola, MD Date: 05/21/2018; Time: 8:07 AM  Disclaimer:  Medicine is not an Chief Strategy Officer. The only guarantee in medicine is that nothing is guaranteed. It is important to note that the decision to proceed with this intervention was based on the information collected from the patient. The Data and conclusions were drawn from the patient's questionnaire, the interview, and the physical examination. Because the information was provided in large part by the patient, it cannot be guaranteed that it has not been purposely or unconsciously manipulated. Every effort has been made to obtain as much relevant data as possible for this evaluation. It is important to note that the conclusions that lead to this procedure are derived in large part from the available data. Always take into account that the treatment will also be dependent on availability of resources and existing treatment guidelines, considered by other Pain Management Practitioners as being common knowledge and practice, at the time of the intervention. For Medico-Legal purposes, it is also important to point out that variation in procedural techniques and pharmacological choices are the acceptable norm. The indications, contraindications, technique, and results of the above procedure should only be interpreted and judged by a  Board-Certified Interventional Pain Specialist with extensive familiarity and expertise in the same exact procedure and technique.

## 2018-05-21 NOTE — Telephone Encounter (Signed)
Tried to call patient back no answer or voicemail.

## 2018-05-28 ENCOUNTER — Ambulatory Visit: Payer: Medicare HMO | Admitting: Gastroenterology

## 2018-05-28 ENCOUNTER — Encounter: Payer: Self-pay | Admitting: Gastroenterology

## 2018-05-30 ENCOUNTER — Ambulatory Visit: Payer: Medicare HMO | Attending: Pain Medicine | Admitting: Pain Medicine

## 2018-05-30 ENCOUNTER — Other Ambulatory Visit: Payer: Self-pay

## 2018-05-30 ENCOUNTER — Encounter: Payer: Self-pay | Admitting: Pain Medicine

## 2018-05-30 VITALS — BP 126/80 | HR 86 | Temp 97.5°F | Resp 18 | Ht 67.0 in | Wt 240.0 lb

## 2018-05-30 DIAGNOSIS — M25561 Pain in right knee: Secondary | ICD-10-CM | POA: Diagnosis not present

## 2018-05-30 DIAGNOSIS — M25562 Pain in left knee: Secondary | ICD-10-CM | POA: Diagnosis not present

## 2018-05-30 DIAGNOSIS — Z7901 Long term (current) use of anticoagulants: Secondary | ICD-10-CM | POA: Insufficient documentation

## 2018-05-30 DIAGNOSIS — M1711 Unilateral primary osteoarthritis, right knee: Secondary | ICD-10-CM | POA: Diagnosis not present

## 2018-05-30 DIAGNOSIS — M170 Bilateral primary osteoarthritis of knee: Secondary | ICD-10-CM | POA: Diagnosis not present

## 2018-05-30 DIAGNOSIS — G8929 Other chronic pain: Secondary | ICD-10-CM | POA: Diagnosis not present

## 2018-05-30 MED ORDER — ROPIVACAINE HCL 2 MG/ML IJ SOLN
4.0000 mL | Freq: Once | INTRAMUSCULAR | Status: AC
  Administered 2018-05-30: 10 mL via INTRA_ARTICULAR

## 2018-05-30 MED ORDER — SODIUM HYALURONATE (VISCOSUP) 20 MG/2ML IX SOSY
2.0000 mL | PREFILLED_SYRINGE | Freq: Once | INTRA_ARTICULAR | Status: AC
  Administered 2018-05-30: 13:00:00 via INTRA_ARTICULAR

## 2018-05-30 MED ORDER — LIDOCAINE HCL (PF) 1 % IJ SOLN
4.0000 mL | Freq: Once | INTRAMUSCULAR | Status: AC
  Administered 2018-05-30: 5 mL

## 2018-05-30 MED ORDER — LIDOCAINE HCL (PF) 1 % IJ SOLN
INTRAMUSCULAR | Status: AC
  Filled 2018-05-30: qty 5

## 2018-05-30 MED ORDER — ROPIVACAINE HCL 2 MG/ML IJ SOLN
INTRAMUSCULAR | Status: AC
  Filled 2018-05-30: qty 10

## 2018-05-30 NOTE — Patient Instructions (Signed)

## 2018-05-30 NOTE — Progress Notes (Signed)
Safety precautions to be maintained throughout the outpatient stay will include: orient to surroundings, keep bed in low position, maintain call bell within reach at all times, provide assistance with transfer out of bed and ambulation.  

## 2018-05-30 NOTE — Progress Notes (Signed)
Patient's Name: Nicholas Reyes  MRN: [id number]  Referring Provider: Orland Mustard *  DOB: April 14, 1956  PCP: McLean-Scocuzza, Nino Glow, MD  DOS: 05/30/2018  Note by: Gaspar Cola, MD  Service setting: Ambulatory outpatient  Specialty: Interventional Pain Management  Patient type: Established  Location: [facility] (AMB) Pain Management Facility  Visit type: Interventional Procedure   Primary Reason for Visit: Interventional Pain Management Treatment. CC: Knee Pain (bilateral)  Procedure:          Anesthesia, Analgesia, Anxiolysis:  Type: Therapeutic Intra-Articular Hyalgan Knee Injection #5  Region: Lateral infrapatellar Knee Region Level: Knee Joint Laterality: Bilateral  Type: Local Anesthesia Indication(s): Analgesia         Local Anesthetic: Lidocaine 1-2% Route: Infiltration ([place]/IM) IV Access: Declined Sedation: Declined   Position: Sitting   Indications: 1. 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 Tricompartment osteoarthritis of knee (Right)   3. Chronic knee pain (Primary Area of Pain) (Bilateral) (R>L)    Pain Score: Pre-procedure: 4 /10 Post-procedure: 0-No pain/10  Pre-op Assessment:  Nicholas Reyes is a 63 y.o. (year old), male patient, seen today for interventional treatment. He  has a past surgical history that includes Cataract extraction (Bilateral); Cholecystectomy; Appendectomy; Tonsillectomy; STENT PLACEMENT VASCULAR ([place] HX); right knee surgery; Tonsillectomy and adenoidectomy; hemorrhoid surgery ; Eye surgery; Coronary angioplasty with stent; Hemorrhoid surgery; Knee surgery; and LEFT HEART CATH AND CORONARY ANGIOGRAPHY (Left, 01/30/2018). Nicholas Reyes has a current medication list which includes the following prescription(s): albuterol, amlodipine, aspirin, atorvastatin, budesonide-formoterol, cetirizine, cholecalciferol, clobetasol ointment, clopidogrel, diclofenac, duloxetine, fluticasone, furosemide, hydroxyzine, isosorbide mononitrate, ketorolac,  meloxicam, metformin, metoprolol tartrate, montelukast, mupirocin ointment, nitroglycerin, omeprazole, ondansetron, oxycodone-acetaminophen, pantoprazole, prednisolone acetate, secukinumab, spironolactone, sucralfate, and triamcinolone cream. His primarily concern today is the Knee Pain (bilateral)  Initial Vital Signs:  Pulse/HCG Rate: 90  Temp: (!) 97.5 F (36.4 C) Resp: 18 BP: 121/82 SpO2: 98 %  BMI: Estimated body mass index is 37.59 kg/m as calculated from the following:   Height as of this encounter: 5\' 7"  (1.702 m).   Weight as of this encounter: 240 lb (108.9 kg).  Risk Assessment: Allergies: Reviewed. He has No Known Allergies.  Allergy Precautions: None required Coagulopathies: Reviewed. None identified.  Blood-thinner therapy: None at this time Active Infection(s): Reviewed. None identified. Nicholas Reyes is afebrile  Site Confirmation: Nicholas Reyes was asked to confirm the procedure and laterality before marking the site Procedure checklist: Completed Consent: Before the procedure and under the influence of no sedative(s), amnesic(s), or anxiolytics, the patient was informed of the treatment options, risks and possible complications. To fulfill our ethical and legal obligations, as recommended by the American Medical Association's Code of Ethics, I have informed the patient of my clinical impression; the nature and purpose of the treatment or procedure; the risks, benefits, and possible complications of the intervention; the alternatives, including doing nothing; the risk(s) and benefit(s) of the alternative treatment(s) or procedure(s); and the risk(s) and benefit(s) of doing nothing. The patient was provided information about the general risks and possible complications associated with the procedure. These may include, but are not limited to: failure to achieve desired goals, infection, bleeding, organ or nerve damage, allergic reactions, paralysis, and death. In addition, the  patient was informed of those risks and complications associated to the procedure, such as failure to decrease pain; infection; bleeding; organ or nerve damage with subsequent damage to sensory, motor, and/or autonomic systems, resulting in permanent pain, numbness, and/or weakness of one or several areas of the  body; allergic reactions; (i.e.: anaphylactic reaction); and/or death. Furthermore, the patient was informed of those risks and complications associated with the medications. These include, but are not limited to: allergic reactions (i.e.: anaphylactic or anaphylactoid reaction(s)); adrenal axis suppression; blood sugar elevation that in diabetics may result in ketoacidosis or comma; water retention that in patients with history of congestive heart failure may result in shortness of breath, pulmonary edema, and decompensation with resultant heart failure; weight gain; swelling or edema; medication-induced neural toxicity; particulate matter embolism and blood vessel occlusion with resultant organ, and/or nervous system infarction; and/or aseptic necrosis of one or more joints. Finally, the patient was informed that Medicine is not an exact science; therefore, there is also the possibility of unforeseen or unpredictable risks and/or possible complications that may result in a catastrophic outcome. The patient indicated having understood very clearly. We have given the patient no guarantees and we have made no promises. Enough time was given to the patient to ask questions, all of which were answered to the patient's satisfaction. Nicholas Reyes has indicated that he wanted to continue with the procedure. Attestation: I, the ordering provider, attest that I have discussed with the patient the benefits, risks, side-effects, alternatives, likelihood of achieving goals, and potential problems during recovery for the procedure that I have provided informed consent. Date  Time: 05/30/2018 12:48 PM  Pre-Procedure  Preparation:  Monitoring: As per clinic protocol. Respiration, ETCO2, SpO2, BP, heart rate and rhythm monitor placed and checked for adequate function Safety Precautions: Patient was assessed for positional comfort and pressure points before starting the procedure. Time-out: I initiated and conducted the "Time-out" before starting the procedure, as per protocol. The patient was asked to participate by confirming the accuracy of the "Time Out" information. Verification of the correct person, site, and procedure were performed and confirmed by me, the nursing staff, and the patient. "Time-out" conducted as per Joint Commission's Universal Protocol (UP.01.01.01). Time: 1319  Description of Procedure:          Target Area: Knee Joint Approach: Just above the Lateral tibial plateau, lateral to the infrapatellar tendon. Area Prepped: Entire knee area, from the mid-thigh to the mid-shin. Prepping solution: ChloraPrep (2% chlorhexidine gluconate and 70% isopropyl alcohol) Safety Precautions: Aspiration looking for blood return was conducted prior to all injections. At no point did we inject any substances, as a needle was being advanced. No attempts were made at seeking any paresthesias. Safe injection practices and needle disposal techniques used. Medications properly checked for expiration dates. SDV (single dose vial) medications used. Description of the Procedure: Protocol guidelines were followed. The patient was placed in position over the fluoroscopy table. The target area was identified and the area prepped in the usual manner. Skin & deeper tissues infiltrated with local anesthetic. Appropriate amount of time allowed to pass for local anesthetics to take effect. The procedure needles were then advanced to the target area. Proper needle placement secured. Negative aspiration confirmed. Solution injected in intermittent fashion, asking for systemic symptoms every 0.5cc of injectate. The needles were then  removed and the area cleansed, making sure to leave some of the prepping solution back to take advantage of its long term bactericidal properties. Vitals:   05/30/18 1248 05/30/18 1249 05/30/18 1321  BP:  121/82 126/80  Pulse:  90 86  Resp:  18 18  Temp:  (!) 97.5 F (36.4 C)   SpO2:  98% 98%  Weight: 240 lb (108.9 kg)    Height: 5\' 7"  (1.702 m)  Start Time: 1319 hrs. End Time: 1320 hrs. Materials:  Needle(s) Type: Regular needle Gauge: 25G Length: 1.5-in Medication(s): Please see orders for medications and dosing details.  Imaging Guidance:          Type of Imaging Technique: None used Indication(s): N/A Exposure Time: No patient exposure Contrast: None used. Fluoroscopic Guidance: N/A Ultrasound Guidance: N/A Interpretation: N/A  Antibiotic Prophylaxis:   Anti-infectives (From admission, onward)   None     Indication(s): None identified  Post-operative Assessment:  Post-procedure Vital Signs:  Pulse/HCG Rate: 86  Temp: (!) 97.5 F (36.4 C) Resp: 18 BP: 126/80 SpO2: 98 %  EBL: None  Complications: No immediate post-treatment complications observed by team, or reported by patient.  Note: The patient tolerated the entire procedure well. A repeat set of vitals were taken after the procedure and the patient was kept under observation following institutional policy, for this type of procedure. Post-procedural neurological assessment was performed, showing return to baseline, prior to discharge. The patient was provided with post-procedure discharge instructions, including a section on how to identify potential problems. Should any problems arise concerning this procedure, the patient was given instructions to immediately contact us, at any time, without hesitation. In any case, we plan to contact the patient by telephone for a follow-up status report regarding this interventional procedure.  Comments:  No additional relevant information.  Plan of Care   Imaging  Orders  No imaging studies ordered today    Procedure Orders     KNEE INJECTION  Medications ordered for procedure: Meds ordered this encounter  Medications  . lidocaine (PF) (XYLOCAINE) 1 % injection 4 mL  . ropivacaine (PF) 2 mg/mL (0.2%) (NAROPIN) injection 4 mL  . Sodium Hyaluronate SOSY 2 mL  . Sodium Hyaluronate SOSY 2 mL   Medications administered: We administered lidocaine (PF), ropivacaine (PF) 2 mg/mL (0.2%), Sodium Hyaluronate, and Sodium Hyaluronate.  See the medical record for exact dosing, route, and time of administration.  Disposition: Discharge home  Discharge Date & Time: 05/30/2018; 1322 hrs.   Physician-requested Follow-up: Return for post-procedure eval (2 wks), w/ Dionisio David, NP.  Future Appointments  Date Time Provider [place]  05/30/2018  1:45 PM Milinda Pointer, MD [facility] None  06/11/2018  9:00 AM Leona Singleton, RN [facility] None  06/13/2018 11:30 AM Vevelyn Francois, NP [facility] None  06/26/2018 10:45 AM Jonathon Bellows, MD [facility] None  07/10/2018 11:00 AM McLean-Scocuzza, Nino Glow, MD [facility]   Primary Care Physician: McLean-Scocuzza, Nino Glow, MD Location: [facility] Outpatient Pain Management Facility Note by: Gaspar Cola, MD Date: 05/30/2018; Time: 1:24 PM  Disclaimer:  Medicine is not an Chief Strategy Officer. The only guarantee in medicine is that nothing is guaranteed. It is important to note that the decision to proceed with this intervention was based on the information collected from the patient. The Data and conclusions were drawn from the patient's questionnaire, the interview, and the physical examination. Because the information was provided in large part by the patient, it cannot be guaranteed that it has not been purposely or unconsciously manipulated. Every effort has been made to obtain as much relevant data as possible for this evaluation. It is important to note that the conclusions that lead to this procedure are derived in  large part from the available data. Always take into account that the treatment will also be dependent on availability of resources and existing treatment guidelines, considered by other Pain Management Practitioners as being common knowledge and practice, at  the time of the intervention. For Medico-Legal purposes, it is also important to point out that variation in procedural techniques and pharmacological choices are the acceptable norm. The indications, contraindications, technique, and results of the above procedure should only be interpreted and judged by a Board-Certified Interventional Pain Specialist with extensive familiarity and expertise in the same exact procedure and technique.

## 2018-05-31 ENCOUNTER — Telehealth: Payer: Self-pay

## 2018-05-31 NOTE — Telephone Encounter (Signed)
Pt was called and no problems was reported. 

## 2018-06-04 ENCOUNTER — Ambulatory Visit: Admit: 2018-06-04 | Discharge: 2018-06-05 | Payer: MEDICARE

## 2018-06-04 DIAGNOSIS — L281 Prurigo nodularis: Principal | ICD-10-CM

## 2018-06-04 MED ORDER — CLOBETASOL 0.05 % TOPICAL OINTMENT
2 refills | 0 days | Status: CP
Start: 2018-06-04 — End: ?

## 2018-06-11 ENCOUNTER — Other Ambulatory Visit: Payer: Self-pay | Admitting: *Deleted

## 2018-06-11 NOTE — Patient Outreach (Signed)
[facility]) Care Management  06/11/2018  Nicholas Reyes 04-09-1956 [id number]   RN Health Coach Quarterly Outreach  Referral Date:02/25/2018 Referral Source:[facility] ED Census Reason for Referral:6 or more ED visits in the past 6 months Insurance:[employer] & Medicaid   Outreach Attempt:  Outreach attempt #1 to patient for quarterly follow up. No answer and unable to leave voicemail message due to voicemail box being full.  Plan:  RN Health Coach will make another outreach attempt within the month of March if no return call back from patient.   [place] [phone] [email or web address]

## 2018-06-12 ENCOUNTER — Telehealth: Payer: Self-pay | Admitting: *Deleted

## 2018-06-13 ENCOUNTER — Encounter: Payer: Self-pay | Admitting: Pain Medicine

## 2018-06-13 ENCOUNTER — Ambulatory Visit: Payer: Medicare HMO | Admitting: Nurse Practitioner

## 2018-06-26 ENCOUNTER — Encounter

## 2018-06-26 ENCOUNTER — Encounter: Payer: Self-pay | Admitting: Gastroenterology

## 2018-06-26 ENCOUNTER — Other Ambulatory Visit: Payer: Self-pay

## 2018-06-26 ENCOUNTER — Ambulatory Visit (INDEPENDENT_AMBULATORY_CARE_PROVIDER_SITE_OTHER): Payer: Medicare HMO | Admitting: Gastroenterology

## 2018-06-26 VITALS — BP 99/65 | HR 90 | Ht 68.0 in | Wt 232.4 lb

## 2018-06-26 DIAGNOSIS — R1013 Epigastric pain: Secondary | ICD-10-CM

## 2018-06-26 MED ORDER — DICYCLOMINE HCL 10 MG PO CAPS: 10.0000 mg | ORAL_CAPSULE | Freq: Three times a day (TID) | ORAL | 0 refills | Status: DC

## 2018-06-26 MED ORDER — OMEPRAZOLE 40 MG PO CPDR: 40.0000 mg | DELAYED_RELEASE_CAPSULE | Freq: Every day | ORAL | 3 refills | Status: AC

## 2018-06-26 NOTE — Progress Notes (Signed)
Jonathon Bellows MD, MRCP(U.K) [address]  [place]  [place],  [address]  Main: [phone]  Fax: [phone]   Primary Care Physician: McLean-Scocuzza, Nino Glow, MD  Primary Gastroenterologist:  Dr. Jonathon Bellows   No chief complaint on file.   HPI: Nicholas Reyes is a 63 y.o. male    Summary of history :  He is  here today to see me for a follow-up for abdominal pain .  He presented to the emergency room on 04/09/2018 with abdominal pain of one-week duration.  CT scan of the abdomen was performed which showed a mildly dilated loop of jejunum with wall thickening possibly representing enteritis/dysmotility/partial obstruction.  It is unclear what happened subsequently but I cannot see any other inpatient notes but it appears that he was discharged.  04/09/2018 hemoglobin 14.2 platelet count of 142.  Lipase was 34.  CMP normal.  Urine analysis negative.  Abdominal pain: ongoing for a few months , getting worse , every day , each episode 2-3 hours ,  Site :central abdominal ,Radiation: localized ,10/10 ,sharp ,No aggrevating or relieving factors. , no wt loss .NSAID use: none ,PPI use :no  Gall bladder surgery: taken out  Frequency of bowel movements: every day 1-2 times daily  Change in bowel movements: no - cant recall results of his last colonoscopy - he cant recall when he was told to return,Relief with bowel movements: for a few minutes  Gas/Bloating/Abdominal distension: no     Interval history   04/11/2018- 06/26/2018  04/30/2018 : MR enterogram : normal. Small b/l renal cysts.  Still has abdominal pain , all over his abdomen , all day long. Two bowel movements a day , soft , sometimes relived with the bowel movement Denies any smoking .Sister who smokes around him. Denies any NSAID use. On PPI which helped him a lot but stopped taking it. Last colonoscopy was many years back.    Current Outpatient Medications  Medication Sig Dispense Refill  . albuterol  (PROVENTIL HFA;VENTOLIN HFA) 108 (90 Base) MCG/ACT inhaler Inhale 2 puffs into the lungs every 4 (four) hours as needed. For shortness of breath 3 Inhaler 4  . amLODipine (NORVASC) 5 MG tablet Take 5 mg by mouth daily.    Marland Kitchen aspirin 81 MG EC tablet Take 81 mg by mouth daily.     Marland Kitchen atorvastatin (LIPITOR) 20 MG tablet Take 20 mg by mouth daily.    . budesonide-formoterol (SYMBICORT) 160-4.5 MCG/ACT inhaler Inhale 2 puffs into the lungs 2 (two) times daily. Rinse mouth 1 Inhaler 12  . cetirizine (ZYRTEC) 10 MG tablet Take 10 mg by mouth daily.    . Cholecalciferol 50000 units capsule Take 1 capsule (50,000 Units total) by mouth once a week. 13 capsule 1  . clobetasol ointment (TEMOVATE) 6.71 % Apply 1 application topically 2 (two) times daily. Right ear as needed not face or private 45 g 0  . clopidogrel (PLAVIX) 75 MG tablet Take 75 mg by mouth daily.    . diclofenac (FLECTOR) 1.3 % PTCH Place 1 patch onto the skin 2 (two) times daily. 60 patch 2  . DULoxetine (CYMBALTA) 60 MG capsule Take 1 capsule (60 mg total) by mouth daily. 90 capsule 3  . fluticasone (FLONASE) 50 MCG/ACT nasal spray Place 2 sprays into both nostrils daily. 16 g 6  . furosemide (LASIX) 40 MG tablet Take 40 mg by mouth daily.    . hydrOXYzine (ATARAX/VISTARIL) 25 MG tablet Take 1 tablet (25 mg  total) by mouth 3 (three) times daily as needed. 30 tablet 0  . isosorbide mononitrate (IMDUR) 30 MG 24 hr tablet Take 1 tablet (30 mg total) by mouth daily. 90 tablet 3  . ketorolac (ACULAR) 0.5 % ophthalmic solution     . meloxicam (MOBIC) 7.5 MG tablet Take 1 tablet (7.5 mg total) by mouth 2 (two) times daily as needed for pain. 60 tablet 2  . metFORMIN (GLUCOPHAGE) 500 MG tablet Take 1 tablet (500 mg total) by mouth daily with breakfast. 90 tablet 3  . metoprolol tartrate (LOPRESSOR) 25 MG tablet Take 25 mg by mouth 2 (two) times daily.    . montelukast (SINGULAIR) 10 MG tablet Take 10 mg by mouth at bedtime.    . mupirocin ointment  (BACTROBAN) 2 % Apply 1 application topically 2 (two) times daily. Right ear 30 g 2  . nitroGLYCERIN (NITROSTAT) 0.4 MG SL tablet Place 0.4 mg under the tongue every 5 (five) minutes as needed. For chest pain    . omeprazole (PRILOSEC) 40 MG capsule Take 1 capsule (40 mg total) by mouth daily. 90 capsule 3  . ondansetron (ZOFRAN) 4 MG tablet Take 1 tablet (4 mg total) by mouth every 8 (eight) hours as needed for nausea or vomiting. 20 tablet 0  . oxyCODONE-acetaminophen (PERCOCET) 5-325 MG tablet Take 1 tablet by mouth 2 (two) times daily as needed for severe pain. 10 tablet 0  . pantoprazole (PROTONIX) 20 MG tablet TAKE 1 TABLET EVERY DAY 90 tablet 0  . prednisoLONE acetate (PRED FORTE) 1 % ophthalmic suspension     . Secukinumab (COSENTYX 300 DOSE [place]) Inject into the skin every 30 (thirty) days.    Marland Kitchen spironolactone (ALDACTONE) 25 MG tablet Take 1 tablet (25 mg total) by mouth daily. In am for blood pressure 90 tablet 3  . sucralfate (CARAFATE) 1 g tablet Take 1 tablet (1 g total) by mouth 4 (four) times daily -  with meals and at bedtime. 120 tablet 0  . triamcinolone cream (KENALOG) 0.1 % Apply 1 application topically 2 (two) times daily. Not face, groin, underarms 454 g 11   No current facility-administered medications for this visit.     Allergies as of 06/26/2018  . (No Known Allergies)    ROS:  General: Negative for anorexia, weight loss, fever, chills, fatigue, weakness. ENT: Negative for hoarseness, difficulty swallowing , nasal congestion. CV: Negative for chest pain, angina, palpitations, dyspnea on exertion, peripheral edema.  Respiratory: Negative for dyspnea at rest, dyspnea on exertion, cough, sputum, wheezing.  GI: See history of present illness. GU:  Negative for dysuria, hematuria, urinary incontinence, urinary frequency, nocturnal urination.  Endo: Negative for unusual weight change.    Physical Examination:   BP 99/65   Pulse 90   Ht 5\' 8"  (1.727 m)   Wt 232 lb  6.4 oz (105.4 kg)   BMI 35.34 kg/m   General: Well-nourished, well-developed in no acute distress.  Eyes: No icterus. Conjunctivae pink. Mouth: Oropharyngeal mucosa moist and pink , no lesions erythema or exudate. Lungs: Clear to auscultation bilaterally. Non-labored. Heart: Regular rate and rhythm, no murmurs rubs or gallops.  Abdomen: Bowel sounds are normal, nontender, nondistended, no hepatosplenomegaly or masses, no abdominal bruits or hernia , no rebound or guarding.   Extremities: No lower extremity edema. No clubbing or deformities. Neuro: Alert and oriented x 3.  Grossly intact. Skin: Warm and dry, no jaundice.   Psych: Alert and cooperative, normal mood and affect.   Imaging  Studies: No results found.  Assessment and Plan:   Nicholas Reyes is a 63 y.o. y/o male here to follow up  for abdominal pain. Recent ER visit and CT abdomen showed features of possible enteritis. MRE showed resolution. Pain resolved on PPI, returned when he went off it.    Plan  1.Omeprazole 40 mg once daily ,Bentyl PRN for pain  2. H pylori breath 3. EGD+ colonoscopy if no better in 6 weeks despite PPI use,    Dr Jonathon Bellows  MD,MRCP [facility]) Follow up in 6-8 weeks

## 2018-07-07 ENCOUNTER — Other Ambulatory Visit: Payer: Self-pay | Admitting: Internal Medicine

## 2018-07-07 DIAGNOSIS — M1990 Unspecified osteoarthritis, unspecified site: Secondary | ICD-10-CM

## 2018-07-07 MED ORDER — MELOXICAM 7.5 MG PO TABS: 7.5000 mg | ORAL_TABLET | Freq: Two times a day (BID) | ORAL | 2 refills | Status: DC | PRN

## 2018-07-10 ENCOUNTER — Encounter: Payer: Self-pay | Admitting: Internal Medicine

## 2018-07-10 ENCOUNTER — Ambulatory Visit (INDEPENDENT_AMBULATORY_CARE_PROVIDER_SITE_OTHER): Payer: Medicare HMO | Admitting: Internal Medicine

## 2018-07-10 VITALS — BP 130/70 | HR 85 | Temp 98.2°F | Ht 68.0 in | Wt 234.2 lb

## 2018-07-10 DIAGNOSIS — R739 Hyperglycemia, unspecified: Secondary | ICD-10-CM | POA: Diagnosis not present

## 2018-07-10 DIAGNOSIS — E559 Vitamin D deficiency, unspecified: Secondary | ICD-10-CM

## 2018-07-10 DIAGNOSIS — L409 Psoriasis, unspecified: Secondary | ICD-10-CM

## 2018-07-10 DIAGNOSIS — L281 Prurigo nodularis: Secondary | ICD-10-CM

## 2018-07-10 DIAGNOSIS — E785 Hyperlipidemia, unspecified: Secondary | ICD-10-CM

## 2018-07-10 DIAGNOSIS — R0602 Shortness of breath: Secondary | ICD-10-CM

## 2018-07-10 DIAGNOSIS — D696 Thrombocytopenia, unspecified: Secondary | ICD-10-CM

## 2018-07-10 DIAGNOSIS — I10 Essential (primary) hypertension: Secondary | ICD-10-CM | POA: Diagnosis not present

## 2018-07-10 DIAGNOSIS — J449 Chronic obstructive pulmonary disease, unspecified: Secondary | ICD-10-CM | POA: Diagnosis not present

## 2018-07-10 DIAGNOSIS — R600 Localized edema: Secondary | ICD-10-CM

## 2018-07-10 DIAGNOSIS — R079 Chest pain, unspecified: Secondary | ICD-10-CM

## 2018-07-10 DIAGNOSIS — L03115 Cellulitis of right lower limb: Secondary | ICD-10-CM

## 2018-07-10 DIAGNOSIS — T148XXA Other injury of unspecified body region, initial encounter: Secondary | ICD-10-CM

## 2018-07-10 DIAGNOSIS — E876 Hypokalemia: Secondary | ICD-10-CM

## 2018-07-10 LAB — COMPREHENSIVE METABOLIC PANEL
ALT: 13 U/L (ref 0–53)
AST: 12 U/L (ref 0–37)
Albumin: 3.9 g/dL (ref 3.5–5.2)
Alkaline Phosphatase: 61 U/L (ref 39–117)
BUN: 13 mg/dL (ref 6–23)
CO2: 34 mEq/L — ABNORMAL HIGH (ref 19–32)
Calcium: 8.7 mg/dL (ref 8.4–10.5)
Chloride: 101 mEq/L (ref 96–112)
Creatinine, Ser: 0.82 mg/dL (ref 0.40–1.50)
GFR: 95.33 mL/min (ref >60.00)
Glucose, Bld: 86 mg/dL (ref 70–99)
Potassium: 4.2 mEq/L (ref 3.5–5.1)
Sodium: 140 mEq/L (ref 135–145)
Total Bilirubin: 0.4 mg/dL (ref 0.2–1.2)
Total Protein: 6.2 g/dL (ref 6.0–8.3)

## 2018-07-10 LAB — LIPID PANEL
Cholesterol: 143 mg/dL (ref 0–200)
HDL: 44.1 mg/dL (ref >39.00)
LDL Cholesterol: 79 mg/dL (ref 0–99)
NonHDL: 99.33
TRIGLYCERIDES: 103 mg/dL (ref 0.0–149.0)
Total CHOL/HDL Ratio: 3
VLDL: 20.6 mg/dL (ref 0.0–40.0)

## 2018-07-10 LAB — CBC WITH DIFFERENTIAL/PLATELET
Basophils Absolute: 0 10*3/uL (ref 0.0–0.1)
Basophils Relative: 0.6 % (ref 0.0–3.0)
EOS ABS: 0.2 10*3/uL (ref 0.0–0.7)
Eosinophils Relative: 3.3 % (ref 0.0–5.0)
HCT: 45 % (ref 39.0–52.0)
Hemoglobin: 14.6 g/dL (ref 13.0–17.0)
Lymphocytes Relative: 29.6 % (ref 12.0–46.0)
Lymphs Abs: 1.7 10*3/uL (ref 0.7–4.0)
MCHC: 32.5 g/dL (ref 30.0–36.0)
MCV: 94.5 fl (ref 78.0–100.0)
Monocytes Absolute: 0.4 10*3/uL (ref 0.1–1.0)
Monocytes Relative: 7 % (ref 3.0–12.0)
Neutro Abs: 3.5 10*3/uL (ref 1.4–7.7)
Neutrophils Relative %: 59.5 % (ref 43.0–77.0)
Platelets: 124 10*3/uL — ABNORMAL LOW (ref 150.0–400.0)
RBC: 4.76 Mil/uL (ref 4.22–5.81)
RDW: 17.7 % — ABNORMAL HIGH (ref 11.5–15.5)
WBC: 5.9 10*3/uL (ref 4.0–10.5)

## 2018-07-10 LAB — VITAMIN D 25 HYDROXY (VIT D DEFICIENCY, FRACTURES): VITD: 39.46 ng/mL (ref 30.00–100.00)

## 2018-07-10 LAB — TROPONIN I: Troponin I: <0.01 ng/mL (ref <=0.0)

## 2018-07-10 LAB — MAGNESIUM: Magnesium: 1.7 mg/dL (ref 1.5–2.5)

## 2018-07-10 LAB — HEMOGLOBIN A1C: Hgb A1c MFr Bld: 6.5 % (ref 4.6–6.5)

## 2018-07-10 MED ORDER — FUROSEMIDE 40 MG PO TABS: 40.0000 mg | ORAL_TABLET | Freq: Two times a day (BID) | ORAL | 5 refills | Status: AC

## 2018-07-10 MED ORDER — POTASSIUM CHLORIDE CRYS ER 20 MEQ PO TBCR: 20.0000 meq | EXTENDED_RELEASE_TABLET | Freq: Every day | ORAL | 1 refills | Status: AC

## 2018-07-10 MED ORDER — HYDROCORTISONE 2.5 % EX CREA: TOPICAL_CREAM | Freq: Two times a day (BID) | CUTANEOUS | 0 refills | Status: AC

## 2018-07-10 MED ORDER — DOXYCYCLINE HYCLATE 100 MG PO TABS: 100.0000 mg | ORAL_TABLET | Freq: Two times a day (BID) | ORAL | 0 refills | Status: DC

## 2018-07-10 MED ORDER — MUPIROCIN 2 % EX OINT: 1.0000 "application " | TOPICAL_OINTMENT | Freq: Two times a day (BID) | CUTANEOUS | 2 refills | Status: AC

## 2018-07-10 NOTE — Progress Notes (Signed)
Chief Complaint  Patient presents with  . Follow-up    sore on butt/face SOB   F/u  1. C/o chest pain and sob chronically but worse since last visit sob at rest and with exertion nothing tried but chronic meds  2. C/o b/l leg swelling and and right leg red and warm R leg more swollen than left  3. 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 Vitamin D def will repeat vitamin d today    Review of Systems  Constitutional: Negative for weight loss.  HENT: Negative for hearing loss.   Eyes: Negative for blurred vision.  Respiratory: Positive for shortness of breath.   Cardiovascular: Positive for chest pain and leg swelling.  Gastrointestinal: Negative for abdominal pain.  Musculoskeletal: Negative for falls.  Skin: Positive for itching and rash.  Neurological: Negative for headaches.  Psychiatric/Behavioral: Negative for depression.   Past Medical History:  Diagnosis Date  . Arthritis   . CAD (coronary artery disease)   . COPD (chronic obstructive pulmonary disease) ([place])   . Depression   . Diabetes mellitus   . Heart disease    cad with 3 stents   . Hypertension   . MI (myocardial infarction) ([place])   . Migraines   . Psoriasis    joint pain in knees left hand    Past Surgical History:  Procedure Laterality Date  . APPENDECTOMY    . CATARACT EXTRACTION Bilateral    inserted lens b/l eyes right 03/12/17, left 05/28/17   . CHOLECYSTECTOMY    . CORONARY ANGIOPLASTY WITH STENT PLACEMENT     LAD, LCX-OM, ? 3rd location  . EYE SURGERY     cataract b/l   . HEMORRHOID SURGERY    . hemorrhoid surgery      x 2   . KNEE SURGERY     right knee arthroscopy, partial synovectecomy 04/21/15 and meniscectomy/chondroplasty   . LEFT HEART CATH AND CORONARY ANGIOGRAPHY Left 01/30/2018   Procedure: LEFT HEART CATH AND CORONARY ANGIOGRAPHY;  Surgeon: Isaias Cowman, MD;  Location: [facility];  Service: Cardiovascular;  Laterality: Left;  . right knee surgery     ? type of procedure   . STENT PLACEMENT VASCULAR ([place] [facility])    . TONSILLECTOMY    . TONSILLECTOMY AND ADENOIDECTOMY     Family History  Problem Relation Age of Onset  . Cancer Mother   . Heart disease Father   . Stroke Father   . Heart disease Brother    Social History   Socioeconomic History  . Marital status: Single    Spouse name: Not on file  . Number of children: 0  . Years of education: Not on file  . Highest education level: Not on file  Occupational History  . Not on file  Social Needs  . Financial resource strain: Not on file  . Food insecurity:    Worry: Not on file    Inability: Not on file  . Transportation needs:    Medical: Not on file    Non-medical: Not on file  Tobacco Use  . Smoking status: Former Research scientist (life sciences)  . Smokeless tobacco: Never Used  Substance and Sexual Activity  . Alcohol use: No    Frequency: Never  . Drug use: No  . Sexual activity: Not on file  Lifestyle  . Physical activity:    Days per week:  Not on file    Minutes per session: Not on file  . Stress: Not on file  Relationships  . Social connections:    Talks on phone: Not on file    Gets together: Not on file    Attends religious service: Not on file    Active member of club or organization: Not on file    Attends meetings of clubs or organizations: Not on file    Relationship status: Not on file  . Intimate partner violence:    Fear of current or ex partner: Not on file    Emotionally abused: Not on file    Physically abused: Not on file    Forced sexual activity: Not on file  Other Topics Concern  . Not on file  Social History Narrative   Former smoker quit 2013/2014 used to smoke<1 ppd x 2-3 years    No kids   Single not sexually active    Unable to read/write    Has dogs and cats at home    No guns   Wears seat belt    Current Meds  Medication Sig  . albuterol (PROVENTIL HFA;VENTOLIN HFA)  108 (90 Base) MCG/ACT inhaler Inhale 2 puffs into the lungs every 4 (four) hours as needed. For shortness of breath  . amLODipine (NORVASC) 5 MG tablet Take 5 mg by mouth daily.  Marland Kitchen aspirin 81 MG EC tablet Take 81 mg by mouth daily.   Marland Kitchen atorvastatin (LIPITOR) 20 MG tablet Take 20 mg by mouth daily.  . budesonide-formoterol (SYMBICORT) 160-4.5 MCG/ACT inhaler Inhale 2 puffs into the lungs 2 (two) times daily. Rinse mouth  . cetirizine (ZYRTEC) 10 MG tablet Take 10 mg by mouth daily.  . Cholecalciferol 50000 units capsule Take 1 capsule (50,000 Units total) by mouth once a week.  . clobetasol ointment (TEMOVATE) 3.23 % Apply 1 application topically 2 (two) times daily. Right ear as needed not face or private  . clopidogrel (PLAVIX) 75 MG tablet Take 75 mg by mouth daily.  . diclofenac (FLECTOR) 1.3 % PTCH Place 1 patch onto the skin 2 (two) times daily.  Marland Kitchen dicyclomine (BENTYL) 10 MG capsule Take 1 capsule (10 mg total) by mouth 4 (four) times daily -  before meals and at bedtime.  . DULoxetine (CYMBALTA) 60 MG capsule Take 1 capsule (60 mg total) by mouth daily.  . fluticasone (FLONASE) 50 MCG/ACT nasal spray Place 2 sprays into both nostrils daily.  . furosemide (LASIX) 40 MG tablet Take 1 tablet (40 mg total) by mouth 2 (two) times daily. In am and at lunch  . hydrOXYzine (ATARAX/VISTARIL) 25 MG tablet Take 1 tablet (25 mg total) by mouth 3 (three) times daily as needed.  . isosorbide mononitrate (IMDUR) 30 MG 24 hr tablet Take 1 tablet (30 mg total) by mouth daily.  Marland Kitchen ketorolac (ACULAR) 0.5 % ophthalmic solution   . meloxicam (MOBIC) 7.5 MG tablet Take 1 tablet (7.5 mg total) by mouth 2 (two) times daily as needed for pain.  . metFORMIN (GLUCOPHAGE) 500 MG tablet Take 1 tablet (500 mg total) by mouth daily with breakfast.  . metoprolol tartrate (LOPRESSOR) 25 MG tablet Take 25 mg by mouth 2 (two) times daily.  . montelukast (SINGULAIR) 10 MG tablet Take 10 mg by mouth at bedtime.  . mupirocin  ointment (BACTROBAN) 2 % Apply 1 application topically 2 (two) times daily. Any open sores  . nitroGLYCERIN (NITROSTAT) 0.4 MG SL tablet Place 0.4 mg under the tongue every  5 (five) minutes as needed. For chest pain  . omeprazole (PRILOSEC) 40 MG capsule Take 1 capsule (40 mg total) by mouth daily.  . ondansetron (ZOFRAN) 4 MG tablet Take 1 tablet (4 mg total) by mouth every 8 (eight) hours as needed for nausea or vomiting.  Marland Kitchen oxyCODONE-acetaminophen (PERCOCET) 5-325 MG tablet Take 1 tablet by mouth 2 (two) times daily as needed for severe pain.  . pantoprazole (PROTONIX) 20 MG tablet TAKE 1 TABLET EVERY DAY  . prednisoLONE acetate (PRED FORTE) 1 % ophthalmic suspension   . Secukinumab (COSENTYX 300 DOSE [place]) Inject into the skin every 30 (thirty) days.  Marland Kitchen spironolactone (ALDACTONE) 25 MG tablet Take 1 tablet (25 mg total) by mouth daily. In am for blood pressure  . sucralfate (CARAFATE) 1 g tablet Take 1 tablet (1 g total) by mouth 4 (four) times daily -  with meals and at bedtime.  . triamcinolone cream (KENALOG) 0.1 % Apply 1 application topically 2 (two) times daily. Not face, groin, underarms  . [DISCONTINUED] furosemide (LASIX) 40 MG tablet Take 40 mg by mouth daily.  . [DISCONTINUED] mupirocin ointment (BACTROBAN) 2 % Apply 1 application topically 2 (two) times daily. Right ear   No Known Allergies Recent Results (from the past 2160 hour(s))  H. pylori breath test     Status: None   Collection Time: 04/12/18  1:35 PM  Result Value Ref Range   H pylori Breath Test CANCELED     Comment: Specimen quantity insufficient for verification by repeat analysis.  Result canceled by the ancillary.   H. pylori breath test     Status: None   Collection Time: 04/22/18  1:29 PM  Result Value Ref Range   H. pylori UBiT QUANTITY NOT SUFFICIENT TO REPEAT TEST     Comment: (NOTE) Specimen quantity insufficient for verification by repeat analysis.      Adele Dan notified 04/24/2018 Performed At: [facility] [place], [place] [id number] Rush Farmer MD [id number]   Gastrointestinal Panel by PCR , Stool     Status: Abnormal   Collection Time: 04/30/18  2:00 AM  Result Value Ref Range   Campylobacter species NOT DETECTED NOT DETECTED   Plesimonas shigelloides NOT DETECTED NOT DETECTED   Salmonella species NOT DETECTED NOT DETECTED   Yersinia enterocolitica NOT DETECTED NOT DETECTED   Vibrio species NOT DETECTED NOT DETECTED   Vibrio cholerae NOT DETECTED NOT DETECTED   Enteroaggregative E coli (EAEC) NOT DETECTED NOT DETECTED   Enteropathogenic E coli (EPEC) DETECTED (A) NOT DETECTED    Comment: RESULT CALLED TO, READ BACK BY AND VERIFIED WITH:  CINDY MORIS AT 1058 04/30/18 SDR    Enterotoxigenic E coli (ETEC) NOT DETECTED NOT DETECTED   Shiga like toxin producing E coli (STEC) NOT DETECTED NOT DETECTED   Shigella/Enteroinvasive E coli (EIEC) NOT DETECTED NOT DETECTED   Cryptosporidium NOT DETECTED NOT DETECTED   Cyclospora cayetanensis NOT DETECTED NOT DETECTED   Entamoeba histolytica NOT DETECTED NOT DETECTED   Giardia lamblia NOT DETECTED NOT DETECTED   Adenovirus F40/41 NOT DETECTED NOT DETECTED   Astrovirus NOT DETECTED NOT DETECTED   Norovirus GI/GII NOT DETECTED NOT DETECTED   Rotavirus A NOT DETECTED NOT DETECTED   Sapovirus (I, II, IV, and V) NOT DETECTED NOT DETECTED    Comment: Performed at [facility], [address] [place],  [address]  C difficile quick scan w PCR reflex     Status: None   Collection Time:  04/30/18  2:00 AM  Result Value Ref Range   C Diff antigen NEGATIVE NEGATIVE   C Diff toxin NEGATIVE NEGATIVE   C Diff interpretation No C. difficile detected.     Comment: Performed at [facility], [address] [place], [place] [address]  Lipase, blood     Status: None   Collection Time: 05/06/18  2:41 AM  Result Value Ref Range   Lipase 34 11 - 51 U/L    Comment: Performed at [facility], [address] [place], [place] [address]  Comprehensive metabolic panel     Status: Abnormal   Collection Time: 05/06/18  2:41 AM  Result Value Ref Range   Sodium 139 135 - 145 mmol/L   Potassium 3.3 (L) 3.5 - 5.1 mmol/L   Chloride 98 98 - 111 mmol/L   CO2 33 (H) 22 - 32 mmol/L   Glucose, Bld 167 (H) 70 - 99 mg/dL   BUN 16 8 - 23 mg/dL   Creatinine, Ser 0.71 0.61 - 1.24 mg/dL   Calcium 8.9 8.9 - 10.3 mg/dL   Total Protein 6.7 6.5 - 8.1 g/dL   Albumin 3.6 3.5 - 5.0 g/dL   AST 30 15 - 41 U/L   ALT 56 (H) 0 - 44 U/L   Alkaline Phosphatase 56 38 - 126 U/L   Total Bilirubin 0.3 0.3 - 1.2 mg/dL   GFR calc non Af Amer >60 >60 mL/min   GFR calc Af Amer >60 >60 mL/min   Anion gap 8 5 - 15    Comment: Performed at [facility], [address] [place], [place] [address]  CBC     Status: Abnormal   Collection Time: 05/06/18  2:41 AM  Result Value Ref Range   WBC 8.8 4.0 - 10.5 K/uL   RBC 4.52 4.22 - 5.81 MIL/uL   Hemoglobin 13.0 13.0 - 17.0 g/dL   HCT 42.6 39.0 - 52.0 %   MCV 94.2 80.0 - 100.0 fL   MCH 28.8 26.0 - 34.0 pg   MCHC 30.5 30.0 - 36.0 g/dL   RDW 14.8 11.5 - 15.5 %   Platelets 142 (L) 150 - 400 K/uL   nRBC 0.0 0.0 - 0.2 %    Comment: Performed at [facility], [address] [place], [place] [address]  Urinalysis, Complete w Microscopic     Status: Abnormal   Collection Time: 05/06/18  2:41 AM  Result Value Ref Range   Color, Urine YELLOW (A) YELLOW   APPearance CLOUDY (A) CLEAR   Specific Gravity, Urine 1.015 1.005 - 1.030   pH 7.0 5.0 - 8.0   Glucose, UA NEGATIVE NEGATIVE mg/dL   Hgb urine dipstick NEGATIVE NEGATIVE   Bilirubin Urine NEGATIVE NEGATIVE   Ketones, ur NEGATIVE NEGATIVE mg/dL   Protein, ur NEGATIVE NEGATIVE mg/dL   Nitrite NEGATIVE NEGATIVE   Leukocytes, UA NEGATIVE NEGATIVE   RBC / HPF 0-5 0 - 5 RBC/hpf   WBC, UA NONE SEEN 0 - 5 WBC/hpf   Bacteria, UA NONE SEEN NONE SEEN   Squamous Epithelial / LPF NONE SEEN 0 - 5    Mucus PRESENT     Comment: Performed at [facility], [address] [place], [place] [address]  Basic metabolic panel     Status: Abnormal   Collection Time: 05/08/18  8:31 PM  Result Value Ref Range   Sodium 135 135 - 145 mmol/L   Potassium 3.3 (L) 3.5 - 5.1 mmol/L  Chloride 96 (L) 98 - 111 mmol/L   CO2 31 22 - 32 mmol/L   Glucose, Bld 124 (H) 70 - 99 mg/dL   BUN 10 8 - 23 mg/dL   Creatinine, Ser 0.70 0.61 - 1.24 mg/dL   Calcium 8.4 (L) 8.9 - 10.3 mg/dL   GFR calc non Af Amer >60 >60 mL/min   GFR calc Af Amer >60 >60 mL/min   Anion gap 8 5 - 15    Comment: Performed at [facility], [address], [place] [address]  CBC     Status: Abnormal   Collection Time: 05/08/18  8:31 PM  Result Value Ref Range   WBC 7.2 4.0 - 10.5 K/uL   RBC 4.63 4.22 - 5.81 MIL/uL   Hemoglobin 13.3 13.0 - 17.0 g/dL   HCT 43.8 39.0 - 52.0 %   MCV 94.6 80.0 - 100.0 fL   MCH 28.7 26.0 - 34.0 pg   MCHC 30.4 30.0 - 36.0 g/dL   RDW 15.1 11.5 - 15.5 %   Platelets 125 (L) 150 - 400 K/uL   nRBC 0.0 0.0 - 0.2 %    Comment: Performed at [facility], [address] [place], [place] [address]  Troponin I - ONCE - STAT     Status: None   Collection Time: 05/08/18  8:31 PM  Result Value Ref Range   Troponin I <0.03 <0.03 ng/mL    Comment: Performed at [facility], [address] [place], [place] [address]  Influenza panel by PCR (type A & B)     Status: None   Collection Time: 05/08/18  9:40 PM  Result Value Ref Range   Influenza A By PCR NEGATIVE NEGATIVE   Influenza B By PCR NEGATIVE NEGATIVE    Comment: (NOTE) The Xpert Xpress Flu assay is intended as an aid in the diagnosis of  influenza and should not be used as a sole basis for treatment.  This  assay is FDA approved for nasopharyngeal swab specimens only. Nasal  washings and aspirates are unacceptable for Xpert Xpress Flu testing. Performed at [facility], [address]  [place], [place] [address]    Objective  Body mass index is 35.61 kg/m. Wt Readings from Last 3 Encounters:  07/10/18 234 lb 3.2 oz (106.2 kg)  06/26/18 232 lb 6.4 oz (105.4 kg)  05/30/18 240 lb (108.9 kg)   Temp Readings from Last 3 Encounters:  07/10/18 98.2 F (36.8 C) (Oral)  05/30/18 (!) 97.5 F (36.4 C)  05/14/18 (!) 97.5 F (36.4 C) (Oral)   BP Readings from Last 3 Encounters:  07/10/18 130/70  06/26/18 99/65  05/30/18 126/80   Pulse Readings from Last 3 Encounters:  07/10/18 85  06/26/18 90  05/30/18 86    Physical Exam Vitals signs and nursing note reviewed.  Constitutional:      Appearance: Normal appearance. He is well-developed and well-groomed.  HENT:     Head: Normocephalic and atraumatic.     Nose: Nose normal.     Mouth/Throat:     Mouth: Mucous membranes are moist.     Pharynx: Oropharynx is clear.  Eyes:     Conjunctiva/sclera: Conjunctivae normal.     Pupils: Pupils are equal, round, and reactive to light.  Cardiovascular:     Rate and Rhythm: Normal rate and regular rhythm.     Heart sounds: Normal heart sounds.     Comments: 2+ leg edema right and 1-2+ left  Pulmonary:     Effort: Pulmonary effort is normal.     Breath sounds: Normal breath sounds.  Musculoskeletal:     Right lower leg: 2+ Pitting Edema present.     Left lower leg: 1+ Pitting Edema present.  Skin:    General: Skin is warm and dry.  Neurological:     General: No focal deficit present.     Mental Status: He is alert and oriented to person, place, and time. Mental status is at baseline.     Gait: Gait normal.  Psychiatric:        Attention and Perception: Attention and perception normal.        Mood and Affect: Mood and affect normal.        Speech: Speech normal.        Behavior: Behavior normal. Behavior is cooperative.        Thought Content: Thought content normal.        Cognition and Memory: Cognition and memory normal.        Judgment: Judgment normal.      Assessment   1. Chest pain and sob  2. Leg swelling R>L with redness ? Cellulitis rle  3. 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 Vitamin D def  5. HM Plan   1. Stat CTA chest Stat echo  Refer back to Dr. Josefa Half  Stat D dimer and proBNP Increase lasix to 40 mg bid with K 20 mg qd  2. R/o DVT stat US RLE Doxycycline bid x 1 week  3. See #2 and bactroban and hc to face lesions  4. Check vitamin D today  5.  Flu shot had 02/22/18 Tdap had 2/12/17or 12/07/15 per prior PCP notes Adacelalso had 08/18/14, 05/13/15, 12/07/15 pna 23 had 09/19/12  Consider hep B vaccine   Check with PCP other vaccines I.e shingrix and colonoscopy, labs,notes I.e cardiology notes if had  hep C (negative) 11/20/16 ImmuneMMR, and vitamin D (low) PSA had 11/15/17 normal Former smoker quit 2013/2014 smoked <1 ppdx2-3 years  Urine had 09/24/17 normal HIV neg 09/15/14 Derm appt sch 06/04/2018 pt to call and get bx report of [facility] bxs today they attempted to call GI-Dr. Vicente Males pending EGD/colonoscopy appt 08/08/2018    Provider: Dr. Olivia Mackie McLean-Scocuzza-Internal Medicine

## 2018-07-10 NOTE — Patient Instructions (Signed)
Shortness of Breath, Adult Shortness of breath is when a person has trouble breathing enough air or when a person feels like she or he is having trouble breathing in enough air. Shortness of breath could be a sign of a medical problem. Follow these instructions at home:   Pay attention to any changes in your symptoms.  Do not use any products that contain nicotine or tobacco, such as cigarettes, e-cigarettes, and chewing tobacco.  Do not smoke. Smoking is a common cause of shortness of breath. If you need help quitting, ask your health care provider.  Avoid things that can irritate your airways, such as: ? Mold. ? Dust. ? Air pollution. ? Chemical fumes. ? Things that can cause allergy symptoms (allergens), if you have allergies.  Keep your living space clean and free of mold and dust.  Rest as needed. Slowly return to your usual activities.  Take over-the-counter and prescription medicines only as told by your health care provider. This includes oxygen therapy and inhaled medicines.  Keep all follow-up visits as told by your health care provider. This is important. Contact a health care provider if:  Your condition does not improve as soon as expected.  You have a hard time doing your normal activities, even after you rest.  You have new symptoms. Get help right away if:  Your shortness of breath gets worse.  You have shortness of breath when you are resting.  You feel light-headed or you faint.  You have a cough that is not controlled with medicines.  You cough up blood.  You have pain with breathing.  You have pain in your chest, arms, shoulders, or abdomen.  You have a fever.  You cannot walk up stairs or exercise the way that you normally do. These symptoms may represent a serious problem that is an emergency. Do not wait to see if the symptoms will go away. Get medical help right away. Call your local emergency services (911 in the [place] Do not drive yourself  to the hospital. Summary  Shortness of breath is when a person has trouble breathing enough air. It can be a sign of a medical problem.  Avoid things that irritate your lungs, such as smoking, pollution, mold, and dust.  Pay attention to changes in your symptoms and contact your health care provider if you have a hard time completing daily activities because of shortness of breath. This information is not intended to replace advice given to you by your health care provider. Make sure you discuss any questions you have with your health care provider. Document Released: 01/17/2001 Document Revised: 09/24/2017 Document Reviewed: 09/24/2017 Elsevier Interactive Patient Education  2019 [employer]  Cellulitis, Adult  Cellulitis is a skin infection. The infected area is usually warm, red, swollen, and tender. This condition occurs most often in the arms and lower legs. The infection can travel to the muscles, blood, and underlying tissue and become serious. It is very important to get treated for this condition. What are the causes? Cellulitis is caused by bacteria. The bacteria enter through a break in the skin, such as a cut, burn, insect bite, open sore, or crack. What increases the risk? This condition is more likely to occur in people who:  Have a weak body defense system (immune system).  Have open wounds on the skin, such as cuts, burns, bites, and scrapes. Bacteria can enter the body through these open wounds.  Are older than 62 years of age.  Have diabetes.  Have a type of long-lasting (chronic) liver disease (cirrhosis) or kidney disease.  Are obese.  Have a skin condition such as: ? Itchy rash (eczema). ? Slow movement of blood in the veins (venous stasis). ? Fluid buildup below the skin (edema).  Have had radiation therapy.  Use IV drugs. What are the signs or symptoms? Symptoms of this condition include:  Redness, streaking, or spotting on the skin.  Swollen  area of the skin.  Tenderness or pain when an area of the skin is touched.  Warm skin.  A fever.  Chills.  Blisters. How is this diagnosed? This condition is diagnosed based on a medical history and physical exam. You may also have tests, including:  Blood tests.  Imaging tests. How is this treated? Treatment for this condition may include:  Medicines, such as antibiotic medicines or medicines to treat allergies (antihistamines).  Supportive care, such as rest and application of cold or warm cloths (compresses) to the skin.  Hospital care, if the condition is severe. The infection usually starts to get better within 1-2 days of treatment. Follow these instructions at home:  Medicines  Take over-the-counter and prescription medicines only as told by your health care provider.  If you were prescribed an antibiotic medicine, take it as told by your health care provider. Do not stop taking the antibiotic even if you start to feel better. General instructions  Drink enough fluid to keep your urine pale yellow.  Do not touch or rub the infected area.  Raise (elevate) the infected area above the level of your heart while you are sitting or lying down.  Apply warm or cold compresses to the affected area as told by your health care provider.  Keep all follow-up visits as told by your health care provider. This is important. These visits let your health care provider make sure a more serious infection is not developing. Contact a health care provider if:  You have a fever.  Your symptoms do not begin to improve within 1-2 days of starting treatment.  Your bone or joint underneath the infected area becomes painful after the skin has healed.  Your infection returns in the same area or another area.  You notice a swollen bump in the infected area.  You develop new symptoms.  You have a general ill feeling (malaise) with muscle aches and pains. Get help right away  if:  Your symptoms get worse.  You feel very sleepy.  You develop vomiting or diarrhea that persists.  You notice red streaks coming from the infected area.  Your red area gets larger or turns dark in color. These symptoms may represent a serious problem that is an emergency. Do not wait to see if the symptoms will go away. Get medical help right away. Call your local emergency services (911 in the [place] Do not drive yourself to the hospital. Summary  Cellulitis is a skin infection. This condition occurs most often in the arms and lower legs.  Treatment for this condition may include medicines, such as antibiotic medicines or antihistamines.  Take over-the-counter and prescription medicines only as told by your health care provider. If you were prescribed an antibiotic medicine, do not stop taking the antibiotic even if you start to feel better.  Contact a health care provider if your symptoms do not begin to improve within 1-2 days of starting treatment or your symptoms get worse.  Keep all follow-up visits as told by your health care provider. This is important.  These visits let your health care provider make sure that a more serious infection is not developing. This information is not intended to replace advice given to you by your health care provider. Make sure you discuss any questions you have with your health care provider. Document Released: 02/01/2005 Document Revised: 09/13/2017 Document Reviewed: 09/13/2017 Elsevier Interactive Patient Education  2019 [employer].

## 2018-07-11 ENCOUNTER — Other Ambulatory Visit: Payer: Self-pay | Admitting: *Deleted

## 2018-07-11 ENCOUNTER — Encounter: Payer: Self-pay | Admitting: *Deleted

## 2018-07-11 NOTE — Patient Outreach (Signed)
[facility]) Care Management  [place]  07/11/2018   Nicholas Reyes 1955/12/14 [id number]   [place] Quarterly Outreach   Referral Date:  02/25/2018 Referral Source:  [facility] ED Census Reason for Referral:  6 or more ED visits in the past 6 months Insurance:  [employer] & Medicaid   Outreach Attempt:  Successful telephone outreach to patient for quarterly follow up.  HIPAA verified with patient.  Patient reporting he is having shortness of breath and increase swelling in his legs.  Was seen by primary care provider on yesterday and she ordered CT scan, lower extremity dopplers, and echocardiogram with cardiology consult.  Patient states he is unsure when the testing is to occur.  Encouraged patient to contact primary care provider to verify when test are scheduled.  Last Hgb A1C was 6.5 on 07/10/2018.  During middle of conversation, telephone line is hung up by patient.  Unable to discuss current blood sugars or ranges.    Encounter Medications:  Outpatient Encounter Medications as of 07/11/2018  Medication Sig Note  . albuterol (PROVENTIL HFA;VENTOLIN HFA) 108 (90 Base) MCG/ACT inhaler Inhale 2 puffs into the lungs every 4 (four) hours as needed. For shortness of breath   . amLODipine (NORVASC) 5 MG tablet Take 5 mg by mouth daily.   Marland Kitchen aspirin 81 MG EC tablet Take 81 mg by mouth daily.    Marland Kitchen atorvastatin (LIPITOR) 20 MG tablet Take 20 mg by mouth daily.   . budesonide-formoterol (SYMBICORT) 160-4.5 MCG/ACT inhaler Inhale 2 puffs into the lungs 2 (two) times daily. Rinse mouth   . cetirizine (ZYRTEC) 10 MG tablet Take 10 mg by mouth daily.   . Cholecalciferol 50000 units capsule Take 1 capsule (50,000 Units total) by mouth once a week.   . clobetasol ointment (TEMOVATE) 4.62 % Apply 1 application topically 2 (two) times daily. Right ear as needed not face or private   . clopidogrel (PLAVIX) 75 MG tablet Take 75 mg by mouth daily. 03/14/2018: Stopped plavix 2-3  days ago (around 11/4-11/5)  . diclofenac (FLECTOR) 1.3 % PTCH Place 1 patch onto the skin 2 (two) times daily.   Marland Kitchen dicyclomine (BENTYL) 10 MG capsule Take 1 capsule (10 mg total) by mouth 4 (four) times daily -  before meals and at bedtime.   Marland Kitchen doxycycline (VIBRA-TABS) 100 MG tablet Take 1 tablet (100 mg total) by mouth 2 (two) times daily. With food x 1 week   . DULoxetine (CYMBALTA) 60 MG capsule Take 1 capsule (60 mg total) by mouth daily.   . fluticasone (FLONASE) 50 MCG/ACT nasal spray Place 2 sprays into both nostrils daily.   . furosemide (LASIX) 40 MG tablet Take 1 tablet (40 mg total) by mouth 2 (two) times daily. In am and at lunch   . hydrocortisone 2.5 % cream Apply topically 2 (two) times daily. Left face left of eye   . hydrOXYzine (ATARAX/VISTARIL) 25 MG tablet Take 1 tablet (25 mg total) by mouth 3 (three) times daily as needed.   . isosorbide mononitrate (IMDUR) 30 MG 24 hr tablet Take 1 tablet (30 mg total) by mouth daily.   Marland Kitchen ketorolac (ACULAR) 0.5 % ophthalmic solution    . meloxicam (MOBIC) 7.5 MG tablet Take 1 tablet (7.5 mg total) by mouth 2 (two) times daily as needed for pain.   . metFORMIN (GLUCOPHAGE) 500 MG tablet Take 1 tablet (500 mg total) by mouth daily with breakfast.   . metoprolol tartrate (LOPRESSOR)  25 MG tablet Take 25 mg by mouth 2 (two) times daily.   . montelukast (SINGULAIR) 10 MG tablet Take 10 mg by mouth at bedtime.   . mupirocin ointment (BACTROBAN) 2 % Apply 1 application topically 2 (two) times daily. Any open sores   . nitroGLYCERIN (NITROSTAT) 0.4 MG SL tablet Place 0.4 mg under the tongue every 5 (five) minutes as needed. For chest pain   . omeprazole (PRILOSEC) 40 MG capsule Take 1 capsule (40 mg total) by mouth daily.   . ondansetron (ZOFRAN) 4 MG tablet Take 1 tablet (4 mg total) by mouth every 8 (eight) hours as needed for nausea or vomiting.   Marland Kitchen oxyCODONE-acetaminophen (PERCOCET) 5-325 MG tablet Take 1 tablet by mouth 2 (two) times daily as  needed for severe pain.   . pantoprazole (PROTONIX) 20 MG tablet TAKE 1 TABLET EVERY DAY   . potassium chloride SA (K-DUR,KLOR-CON) 20 MEQ tablet Take 1 tablet (20 mEq total) by mouth daily.   . prednisoLONE acetate (PRED FORTE) 1 % ophthalmic suspension    . Secukinumab (COSENTYX 300 DOSE [place]) Inject into the skin every 30 (thirty) days.   Marland Kitchen spironolactone (ALDACTONE) 25 MG tablet Take 1 tablet (25 mg total) by mouth daily. In am for blood pressure   . sucralfate (CARAFATE) 1 g tablet Take 1 tablet (1 g total) by mouth 4 (four) times daily -  with meals and at bedtime.   . triamcinolone cream (KENALOG) 0.1 % Apply 1 application topically 2 (two) times daily. Not face, groin, underarms    No facility-administered encounter medications on file as of 07/11/2018.     Functional Status:  In your present state of health, do you have any difficulty performing the following activities: 03/12/2018 01/30/2018  Hearing? Y N  Vision? Y N  Difficulty concentrating or making decisions? Y N  Walking or climbing stairs? N N  Dressing or bathing? N N  Doing errands, shopping? N -  Preparing Food and eating ? N -  Using the Toilet? N -  In the past six months, have you accidently leaked urine? Y -  Do you have problems with loss of bowel control? N -  Managing your Medications? N -  Managing your Finances? N -  Housekeeping or managing your Housekeeping? N -  Some recent data might be hidden    Fall/Depression Screening: Fall Risk  07/11/2018 05/30/2018 05/09/2018  Falls in the past year? (No Data) 0 0  Comment patient hung up prior to me asking this question - -  Number falls in past yr: - - -  Follow up - - -   PHQ 2/9 Scores 05/30/2018 04/22/2018 03/14/2018 03/12/2018 02/25/2018 02/07/2018 01/28/2018  PHQ - 2 Score 0 0 0 0 0 0 0    [facility] CM Care Plan Problem One     Most Recent Value  Care Plan Problem One  Knowledge deficiet related to diabetes.  Role Documenting the Problem One  [place] for Problem One  Active  [facility] Long Term Goal   Patient will report no emergency room visits or hospitalizations in the next 90 days.  [facility] Long Term Goal Start Date  07/11/18  [facility] Long Term Goal Met Date  07/11/18  Interventions for Problem One Long Term Goal  Current care plan and goals reviewed and discussed with patient, encouraged to keep and attend scheduled medical appointments, reviewed medications and encouraged medication compliance, encouraged patient to cerify with primary care provider  when recommended test are scheduled to be completed  (patient hung up telephone line prior to Health Coach able to encourage and ask questions and goals)  [facility] CM Short Term Goal #1   Patient will have CT scan and lower extremity dopplers completed with in the next 10 days.  [facility] CM Short Term Goal #1 Start Date  07/11/18  [facility] CM Short Term Goal #1 Met Date  07/11/18  Interventions for Short Term Goal #1  Reviewed with patient physician's recommendations for CT Scan/dopplers of legs/echo/cardiac referral, encouraged patient to contact physicians office tomorrow to verify when CT scan and dopplers are scheduled to be completed, verified with patient he knows primary care providers number to contact for dates of procedures  [facility] CM Short Term Goal #2   Patient will verbalize his mornings blood sugar at next conversation.  [facility] CM Short Term Goal #2 Start Date  07/11/18  Interventions for Short Term Goal #2  Previously discussed importance of blood sugar monitoring, previously mailed Living Better with Diabetes Education Booklet and encouraged patient to review, (patient hung telephone line up prior to [place] being able to discuss blood sugars)     Appointments:  Attended appointment with primary care provider on 07/10/2018.  Was seen by gastroenterologist on 06/26/2018 with follow up scheduled for 08/08/2018.  Plan: RN Health Coach will make next telephone outreach to patient with in the month of  April. RN Health Coach will send primary care provider Quarterly Update.  [place] [phone] [email or web address]

## 2018-07-12 LAB — NT-PROBNP: Pro B Natriuretic peptide (BNP): 71 pg/mL

## 2018-07-17 ENCOUNTER — Telehealth: Payer: Self-pay | Admitting: Internal Medicine

## 2018-07-17 ENCOUNTER — Telehealth: Payer: Self-pay

## 2018-07-17 NOTE — Telephone Encounter (Signed)
Copied from [place] [phone]. Topic: Quick Communication - Lab Results (Clinic Use ONLY) >> Jul 17, 2018  8:38 AM Lennox Solders wrote: Pt is calling and would like blood work results

## 2018-07-17 NOTE — Telephone Encounter (Addendum)
Pt given results per Dr McLean-Scocuzza, "Heart enzymes normal; Liver kidneys normal; Magnesium normal; Potassium normal though tendency to be low please take low dose potassium pill daily for now; Cholesterol improved  Vitamin D improved not low rec over the counter vitamin D3 5000 IU daily; A1C 6.5 still diabetic range but not too high  Platelets still low; the pt verbalized understanding; results previously released to my chart but the pt says that he does not have access; unable to chart in result note because encounter not created; also see CRM # [phone]; will route to office for notification.

## 2018-07-17 NOTE — Telephone Encounter (Signed)
Called to give results. Patient has already been given the results. He had no questions at this time.

## 2018-07-22 ENCOUNTER — Ambulatory Visit
Admission: RE | Admit: 2018-07-22 | Discharge: 2018-07-22 | Disposition: A | Discharge status: Home / Self Care | Payer: Medicare HMO | Source: Ambulatory Visit | Attending: Internal Medicine | Admitting: Internal Medicine

## 2018-07-22 ENCOUNTER — Telehealth: Payer: Self-pay | Admitting: Internal Medicine

## 2018-07-22 ENCOUNTER — Other Ambulatory Visit: Payer: Self-pay

## 2018-07-22 DIAGNOSIS — R600 Localized edema: Secondary | ICD-10-CM | POA: Insufficient documentation

## 2018-07-22 DIAGNOSIS — R0602 Shortness of breath: Secondary | ICD-10-CM

## 2018-07-22 MED ORDER — IOPAMIDOL (ISOVUE-370) INJECTION 76%
75.0000 mL | Freq: Once | INTRAVENOUS | Status: AC | PRN
  Administered 2018-07-22: 75 mL via INTRAVENOUS

## 2018-07-22 NOTE — Telephone Encounter (Signed)
Pt called again to get ct results. Please call back

## 2018-07-22 NOTE — Telephone Encounter (Signed)
Copied from [place] [phone]. Topic: Quick Communication - See Telephone Encounter >> Jul 22, 2018 11:08 AM Burchel, Abbi R wrote: CRM for notification. See Telephone encounter for: 07/22/18.  Pt calling to get CT scan results.  Please call pt:  [phone]

## 2018-07-22 NOTE — Telephone Encounter (Signed)
Colletta Maryland, [place] Technologist at [facility] called a report of CT Angio, result read back and verified results in chart. She says the patient refused to stay because he has no money and someone was buying him lunch at 11am, so he left. I advised I will send the results to the provider.

## 2018-07-22 NOTE — Telephone Encounter (Signed)
Left message that CT results have not been released as of yet.

## 2018-07-23 NOTE — Telephone Encounter (Signed)
See Results note.  

## 2018-07-27 ENCOUNTER — Emergency Department: Payer: Medicare HMO

## 2018-07-27 ENCOUNTER — Encounter: Payer: Self-pay | Admitting: Emergency Medicine

## 2018-07-27 ENCOUNTER — Emergency Department
Admission: EM | Admit: 2018-07-27 | Discharge: 2018-07-27 | Disposition: A | Discharge status: Home / Self Care | Payer: Medicare HMO | Attending: Emergency Medicine | Admitting: Emergency Medicine

## 2018-07-27 ENCOUNTER — Other Ambulatory Visit: Payer: Self-pay

## 2018-07-27 DIAGNOSIS — Z7902 Long term (current) use of antithrombotics/antiplatelets: Secondary | ICD-10-CM | POA: Diagnosis not present

## 2018-07-27 DIAGNOSIS — Z87891 Personal history of nicotine dependence: Secondary | ICD-10-CM | POA: Insufficient documentation

## 2018-07-27 DIAGNOSIS — E119 Type 2 diabetes mellitus without complications: Secondary | ICD-10-CM | POA: Insufficient documentation

## 2018-07-27 DIAGNOSIS — R519 Headache, unspecified: Secondary | ICD-10-CM

## 2018-07-27 DIAGNOSIS — J189 Pneumonia, unspecified organism: Secondary | ICD-10-CM | POA: Diagnosis not present

## 2018-07-27 DIAGNOSIS — R918 Other nonspecific abnormal finding of lung field: Secondary | ICD-10-CM | POA: Diagnosis not present

## 2018-07-27 DIAGNOSIS — Z7982 Long term (current) use of aspirin: Secondary | ICD-10-CM | POA: Diagnosis not present

## 2018-07-27 DIAGNOSIS — I2510 Atherosclerotic heart disease of native coronary artery without angina pectoris: Secondary | ICD-10-CM | POA: Diagnosis not present

## 2018-07-27 DIAGNOSIS — Z79899 Other long term (current) drug therapy: Secondary | ICD-10-CM | POA: Diagnosis not present

## 2018-07-27 DIAGNOSIS — J449 Chronic obstructive pulmonary disease, unspecified: Secondary | ICD-10-CM | POA: Diagnosis not present

## 2018-07-27 DIAGNOSIS — I10 Essential (primary) hypertension: Secondary | ICD-10-CM | POA: Insufficient documentation

## 2018-07-27 DIAGNOSIS — R0600 Dyspnea, unspecified: Secondary | ICD-10-CM | POA: Diagnosis not present

## 2018-07-27 DIAGNOSIS — R51 Headache: Secondary | ICD-10-CM | POA: Diagnosis not present

## 2018-07-27 DIAGNOSIS — J181 Lobar pneumonia, unspecified organism: Secondary | ICD-10-CM | POA: Diagnosis not present

## 2018-07-27 LAB — COMPREHENSIVE METABOLIC PANEL
ALT: 12 U/L (ref 0–44)
AST: 12 U/L — ABNORMAL LOW (ref 15–41)
Albumin: 3.5 g/dL (ref 3.5–5.0)
Alkaline Phosphatase: 50 U/L (ref 38–126)
Anion gap: 7 (ref 5–15)
BUN: 15 mg/dL (ref 8–23)
CALCIUM: 8.7 mg/dL — AB (ref 8.9–10.3)
CO2: 30 mmol/L (ref 22–32)
Chloride: 103 mmol/L (ref 98–111)
Creatinine, Ser: 0.87 mg/dL (ref 0.61–1.24)
GFR calc Af Amer: >60 mL/min (ref >60)
GFR calc non Af Amer: >60 mL/min (ref >60)
Glucose, Bld: 111 mg/dL — ABNORMAL HIGH (ref 70–99)
Potassium: 4.3 mmol/L (ref 3.5–5.1)
Sodium: 140 mmol/L (ref 135–145)
Total Bilirubin: 0.4 mg/dL (ref 0.3–1.2)
Total Protein: 6.5 g/dL (ref 6.5–8.1)

## 2018-07-27 LAB — CBC
HCT: 44.9 % (ref 39.0–52.0)
Hemoglobin: 13.9 g/dL (ref 13.0–17.0)
MCH: 30.8 pg (ref 26.0–34.0)
MCHC: 31 g/dL (ref 30.0–36.0)
MCV: 99.6 fL (ref 80.0–100.0)
Platelets: 110 10*3/uL — ABNORMAL LOW (ref 150–400)
RBC: 4.51 MIL/uL (ref 4.22–5.81)
RDW: 16.2 % — ABNORMAL HIGH (ref 11.5–15.5)
WBC: 6.6 10*3/uL (ref 4.0–10.5)
nRBC: 0 % (ref 0.0–0.2)

## 2018-07-27 LAB — BRAIN NATRIURETIC PEPTIDE: B Natriuretic Peptide: 25 pg/mL (ref 0.0–100.0)

## 2018-07-27 LAB — TROPONIN I: Troponin I: <0.03 ng/mL (ref <0.03)

## 2018-07-27 MED ORDER — METOCLOPRAMIDE HCL 5 MG/ML IJ SOLN
10.0000 mg | Freq: Once | INTRAMUSCULAR | Status: AC
  Administered 2018-07-27: 10 mg via INTRAVENOUS
  Filled 2018-07-27: qty 2

## 2018-07-27 MED ORDER — KETOROLAC TROMETHAMINE 30 MG/ML IJ SOLN
30.0000 mg | Freq: Once | INTRAMUSCULAR | Status: AC
  Administered 2018-07-27: 30 mg via INTRAVENOUS
  Filled 2018-07-27: qty 1

## 2018-07-27 MED ORDER — KETOROLAC TROMETHAMINE 10 MG PO TABS: 10.0000 mg | ORAL_TABLET | Freq: Four times a day (QID) | ORAL | 0 refills | Status: DC | PRN

## 2018-07-27 MED ORDER — AZITHROMYCIN 500 MG PO TABS: 500.0000 mg | ORAL_TABLET | Freq: Every day | ORAL | 0 refills | Status: AC

## 2018-07-27 NOTE — ED Provider Notes (Signed)
[facility] Emergency Department Provider Note    First MD Initiated Contact with Patient 07/27/18 0423     (approximate)  I have reviewed the triage vital signs and the nursing notes.   HISTORY  Chief Complaint Headache   HPI Nicholas Reyes is a 63 y.o. male below list of chronic medical conditions including CAD MI and migraine headaches presents to the emergency department complaint of migraine headache.  Patient states that he did not take anything for pain relief at home.  Patient states current pain score is 8 out of 10.  Patient denies any weakness numbness gait instability or visual changes.        Past Medical History:  Diagnosis Date  . Arthritis   . CAD (coronary artery disease)   . COPD (chronic obstructive pulmonary disease) ([place])   . Depression   . Diabetes mellitus   . Heart disease    cad with 3 stents   . Hypertension   . MI (myocardial infarction) ([place])   . Migraines   . Psoriasis    joint pain in knees left hand     Patient Active Problem List   Diagnosis Date Noted  . Enteritis 04/10/2018  . 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 Chronic L5-S1 lumbar pars defect (Left) 03/27/2018  . Grade 1 Anterolisthesis of L5 over S1 03/27/2018  . Chronic knee pain (Right) 03/27/2018  . Chronic knee pain (Left) 03/27/2018  . Chronic patellofemoral knee pain (Left) 03/27/2018  . History of nephrolithiasis 03/27/2018  . Right flank pain 02/13/2018  . Osteoarthritis of knee (Bilateral) (R>L) 01/28/2018  . Tricompartment osteoarthritis of knee (Right) 01/28/2018  . Osteoarthritis of hands (Bilateral) 01/28/2018  . Chronic anticoagulation (PLAVIX) 01/28/2018  . Chronic knee pain (Primary Area of Pain) (Bilateral) (R>L) 01/02/2018  . Chronic hand pain (Secondary Area of Pain) (Bilateral) (L>R) 01/02/2018  . Chronic pain syndrome 01/02/2018  . Long term current use of opiate analgesic 01/02/2018  . Pharmacologic therapy 01/02/2018  . Disorder  of skeletal system 01/02/2018  . Problems influencing health status 01/02/2018  . Cognitive impairment 12/11/2017  . DM2 (diabetes mellitus, type 2) ([place]) 12/04/2017  . Psoriasis 11/15/2017  . Skin sore 11/15/2017  . Diabetes mellitus without complication ([place]) 85/06/7739  . Vitamin D deficiency 11/15/2017  . Essential hypertension 06/11/2017  . Coronary artery disease involving native coronary artery of native heart without angina pectoris 06/11/2017  . Exercise-induced angina ([place]) 01/31/2016  . Hypertension 05/18/2014  . Edema 02/09/2014  . Mixed hyperlipidemia 04/02/2013  . HTN (hypertension) 04/02/2013  . Chest pain with high risk for cardiac etiology 12/12/2012  . Dyspnea 12/12/2012  . Infective otitis externa 09/25/2012  . Coronary atherosclerosis 09/02/2012  . Otalgia 09/02/2012  . Benign essential hypertension 08/02/2012  . Chronic airway obstruction, not elsewhere classified 08/02/2012  . Depressive disorder, not elsewhere classified 08/02/2012  . Family history of ischemic heart disease (IHD) 07/08/2012  . Family history of ischemic heart disease and other diseases of the circulatory system 07/08/2012  . Allergic rhinitis 07/05/2012  . Other psoriasis 07/05/2012  . Benign neoplasm of colon 09/22/2010  . Arthritis 06/09/2010  . Asthma 06/09/2010  . Diarrhea 06/09/2010  . Headaches due to old head injury 06/09/2010  . Type 2 diabetes mellitus without complications ([place]) 28/78/6767  . Abdominal pain 06/09/2010    Past Surgical History:  Procedure Laterality Date  . APPENDECTOMY    . CATARACT EXTRACTION Bilateral    inserted lens b/l eyes right 03/12/17,  left 05/28/17   . CHOLECYSTECTOMY    . CORONARY ANGIOPLASTY WITH STENT PLACEMENT     LAD, LCX-OM, ? 3rd location  . EYE SURGERY     cataract b/l   . HEMORRHOID SURGERY    . hemorrhoid surgery      x 2   . KNEE SURGERY     right knee arthroscopy, partial synovectecomy 04/21/15 and meniscectomy/chondroplasty   .  LEFT HEART CATH AND CORONARY ANGIOGRAPHY Left 01/30/2018   Procedure: LEFT HEART CATH AND CORONARY ANGIOGRAPHY;  Surgeon: Isaias Cowman, MD;  Location: [facility];  Service: Cardiovascular;  Laterality: Left;  . right knee surgery     ? type of procedure   . STENT PLACEMENT VASCULAR ([place] HX)    . TONSILLECTOMY    . TONSILLECTOMY AND ADENOIDECTOMY      Prior to Admission medications   Medication Sig Start Date End Date Taking? Authorizing Provider  albuterol (PROVENTIL HFA;VENTOLIN HFA) 108 (90 Base) MCG/ACT inhaler Inhale 2 puffs into the lungs every 4 (four) hours as needed. For shortness of breath 05/14/18   McLean-Scocuzza, Nino Glow, MD  amLODipine (NORVASC) 5 MG tablet Take 5 mg by mouth daily.    [provider]  aspirin 81 MG EC tablet Take 81 mg by mouth daily.     [provider]  atorvastatin (LIPITOR) 20 MG tablet Take 20 mg by mouth daily.    [provider]  azithromycin (ZITHROMAX) 500 MG tablet Take 1 tablet (500 mg total) by mouth daily for 3 days. Take 1 tablet daily for 3 days. 07/27/18 07/30/18  Gregor Hams, MD  budesonide-formoterol [facility]) 160-4.5 MCG/ACT inhaler Inhale 2 puffs into the lungs 2 (two) times daily. Rinse mouth 05/14/18   McLean-Scocuzza, Nino Glow, MD  cetirizine (ZYRTEC) 10 MG tablet Take 10 mg by mouth daily.    [provider]  Cholecalciferol 50000 units capsule Take 1 capsule (50,000 Units total) by mouth once a week. 12/10/17   McLean-Scocuzza, Nino Glow, MD  clobetasol ointment (TEMOVATE) 7.02 % Apply 1 application topically 2 (two) times daily. Right ear as needed not face or private 04/10/18   McLean-Scocuzza, Nino Glow, MD  clopidogrel (PLAVIX) 75 MG tablet Take 75 mg by mouth daily.    [provider]  diclofenac (FLECTOR) 1.3 % PTCH Place 1 patch onto the skin 2 (two) times daily. 11/30/17   McLean-Scocuzza, Nino Glow, MD  dicyclomine (BENTYL) 10 MG capsule Take 1 capsule (10 mg total) by mouth 4  (four) times daily -  before meals and at bedtime. 06/26/18   Jonathon Bellows, MD  doxycycline (VIBRA-TABS) 100 MG tablet Take 1 tablet (100 mg total) by mouth 2 (two) times daily. With food x 1 week 07/10/18   McLean-Scocuzza, Nino Glow, MD  DULoxetine (CYMBALTA) 60 MG capsule Take 1 capsule (60 mg total) by mouth daily. 01/11/18   McLean-Scocuzza, Nino Glow, MD  fluticasone (FLONASE) 50 MCG/ACT nasal spray Place 2 sprays into both nostrils daily. 05/14/18   McLean-Scocuzza, Nino Glow, MD  furosemide (LASIX) 40 MG tablet Take 1 tablet (40 mg total) by mouth 2 (two) times daily. In am and at lunch 07/10/18   McLean-Scocuzza, Nino Glow, MD  hydrocortisone 2.5 % cream Apply topically 2 (two) times daily. Left face left of eye 07/10/18   McLean-Scocuzza, Nino Glow, MD  hydrOXYzine (ATARAX/VISTARIL) 25 MG tablet Take 1 tablet (25 mg total) by mouth 3 (three) times daily as needed. 05/14/18   McLean-Scocuzza, Nino Glow,  MD  isosorbide mononitrate (IMDUR) 30 MG 24 hr tablet Take 1 tablet (30 mg total) by mouth daily. 01/11/18   McLean-Scocuzza, Nino Glow, MD  ketorolac (ACULAR) 0.5 % ophthalmic solution  12/03/17   [provider]  ketorolac (TORADOL) 10 MG tablet Take 1 tablet (10 mg total) by mouth every 6 (six) hours as needed. 07/27/18   Gregor Hams, MD  meloxicam (MOBIC) 7.5 MG tablet Take 1 tablet (7.5 mg total) by mouth 2 (two) times daily as needed for pain. 07/07/18   McLean-Scocuzza, Nino Glow, MD  metFORMIN (GLUCOPHAGE) 500 MG tablet Take 1 tablet (500 mg total) by mouth daily with breakfast. 11/23/17   McLean-Scocuzza, Nino Glow, MD  metoprolol tartrate (LOPRESSOR) 25 MG tablet Take 25 mg by mouth 2 (two) times daily.    [provider]  montelukast (SINGULAIR) 10 MG tablet Take 10 mg by mouth at bedtime.    [provider]  mupirocin ointment (BACTROBAN) 2 % Apply 1 application topically 2 (two) times daily. Any open sores 07/10/18   McLean-Scocuzza, Nino Glow, MD  nitroGLYCERIN (NITROSTAT) 0.4 MG SL tablet  Place 0.4 mg under the tongue every 5 (five) minutes as needed. For chest pain    [provider]  omeprazole (PRILOSEC) 40 MG capsule Take 1 capsule (40 mg total) by mouth daily. 06/26/18 08/27/18  Jonathon Bellows, MD  ondansetron (ZOFRAN) 4 MG tablet Take 1 tablet (4 mg total) by mouth every 8 (eight) hours as needed for nausea or vomiting. 12/19/17   Guse, Jacquelynn Cree, FNP  oxyCODONE-acetaminophen (PERCOCET) 5-325 MG tablet Take 1 tablet by mouth 2 (two) times daily as needed for severe pain. 05/14/18   McLean-Scocuzza, Nino Glow, MD  pantoprazole (PROTONIX) 20 MG tablet TAKE 1 TABLET EVERY DAY 04/29/18   Burnard Hawthorne, FNP  potassium chloride SA (K-DUR,KLOR-CON) 20 MEQ tablet Take 1 tablet (20 mEq total) by mouth daily. 07/10/18   McLean-Scocuzza, Nino Glow, MD  prednisoLONE acetate (PRED FORTE) 1 % ophthalmic suspension  11/30/17   [provider]  Secukinumab (COSENTYX 300 DOSE [place]) Inject into the skin every 30 (thirty) days.    [provider]  spironolactone (ALDACTONE) 25 MG tablet Take 1 tablet (25 mg total) by mouth daily. In am for blood pressure 05/03/18   McLean-Scocuzza, Nino Glow, MD  sucralfate (CARAFATE) 1 g tablet Take 1 tablet (1 g total) by mouth 4 (four) times daily -  with meals and at bedtime. 02/24/18 02/24/19  Merlyn Lot, MD  triamcinolone cream (KENALOG) 0.1 % Apply 1 application topically 2 (two) times daily. Not face, groin, underarms 11/15/17   McLean-Scocuzza, Nino Glow, MD    Allergies Patient has no known allergies.  Family History  Problem Relation Age of Onset  . Cancer Mother   . Heart disease Father   . Stroke Father   . Heart disease Brother     Social History Social History   Tobacco Use  . Smoking status: Former Research scientist (life sciences)  . Smokeless tobacco: Never Used  Substance Use Topics  . Alcohol use: No    Frequency: Never  . Drug use: No    Review of Systems Constitutional: No fever/chills Eyes: No visual changes. ENT: No sore throat.  Cardiovascular: Denies chest pain. Respiratory: Denies shortness of breath. Gastrointestinal: No abdominal pain.  No nausea, no vomiting.  No diarrhea.  No constipation. Genitourinary: Negative for dysuria. Musculoskeletal: Negative for neck pain.  Negative for back pain. Integumentary: Negative for rash. Neurological: Negative for headaches,  focal weakness or numbness.   ____________________________________________   PHYSICAL EXAM:  VITAL SIGNS: ED Triage Vitals  Enc Vitals Group     BP 07/27/18 0354 (!) 122/58     Pulse Rate 07/27/18 0354 85     Resp 07/27/18 0507 (!) 24     Temp 07/27/18 0354 98.2 F (36.8 C)     Temp Source 07/27/18 0354 Oral     SpO2 07/27/18 0354 91 %     Weight 07/27/18 0352 106.2 kg (234 lb 3.2 oz)     Height 07/27/18 0352 1.727 m (5\' 8" )     Head Circumference --      Peak Flow --      Pain Score 07/27/18 0352 9     Pain Loc --      Pain Edu? --      Excl. in [place]? --     Constitutional: Alert and oriented. Well appearing and in no acute distress. Eyes: Conjunctivae are normal.  Mouth/Throat: Mucous membranes are moist.  Oropharynx non-erythematous. Neck: No stridor.   Cardiovascular: Normal rate, regular rhythm. Good peripheral circulation. Grossly normal heart sounds. Respiratory: Normal respiratory effort.  No retractions. Lungs CTAB. Gastrointestinal: Soft and nontender. No distention.  Musculoskeletal: No lower extremity tenderness nor edema. No gross deformities of extremities. Neurologic:  Normal speech and language. No gross focal neurologic deficits are appreciated.  Skin:  Skin is warm, dry and intact. No rash noted.  ____________________________________________   LABS (all labs ordered are listed, but only abnormal results are displayed)  Labs Reviewed  CBC - Abnormal; Notable for the following components:      Result Value   RDW 16.2 (*)    Platelets 110 (*)    All other components within normal limits  COMPREHENSIVE  METABOLIC PANEL - Abnormal; Notable for the following components:   Glucose, Bld 111 (*)    Calcium 8.7 (*)    AST 12 (*)    All other components within normal limits  TROPONIN I  BRAIN NATRIURETIC PEPTIDE      RADIOLOGY I, [place] N BROWN, personally viewed and evaluated these images (plain radiographs) as part of my medical decision making, as well as reviewing the written report by the radiologist.  ED MD interpretation: Mild patchy linear left basilar opacity atelectasis favored per radiologist on chest x-ray interpretation.  Official radiology report(s): Dg Chest Portable 1 View  Result Date: 07/27/2018 CLINICAL DATA:  Initial evaluation for acute dyspnea. EXAM: PORTABLE CHEST 1 VIEW COMPARISON:  Prior radiograph from 05/14/2018 FINDINGS: Allowing for technique, transverse heart size within normal limits. Mediastinal silhouette normal. Mild aortic atherosclerosis. Lungs normally inflated. Irregular patchy and linear densities at the left lung base, favored to reflect atelectasis, although small/early infiltrate not excluded. No other focal airspace disease. No edema or effusion. No pneumothorax. No acute osseous finding. IMPRESSION: Or 1. Mild patchy and linear left basilar opacity. Atelectasis is favored, although a small/early infiltrate could be considered in the correct clinical setting. 2. No other active cardiopulmonary disease. Electronically Signed   By: Jeannine Boga M.D.   On: 07/27/2018 05:25     Procedures   ____________________________________________   INITIAL IMPRESSION / MDM / [place] / ED COURSE  As part of my medical decision making, I reviewed the following data within the electronic MEDICAL RECORD NUMBER  63 year old male presented with above-stated history and physical exam secondary to headache.  Patient given Toradol and Reglan with complete resolution of headache.  Patient admits to "  dyspnea for "a long time".  Patient states that he has  been requesting oxygen for home use observed from his primary care provider for home patient noted to have oxygen saturation of 92% during my evaluation.  Chest x-ray revealed evidence of possible pneumonia.  Patient given azithromycin with recommendation to follow-up with primary care provider as planned.    ____________________________________________  FINAL CLINICAL IMPRESSION(S) / ED DIAGNOSES  Final diagnoses:  Acute nonintractable headache, unspecified headache type  Community acquired pneumonia of right lower lobe of lung ([place])     MEDICATIONS GIVEN DURING THIS VISIT:  Medications  ketorolac (TORADOL) 30 MG/ML injection 30 mg (30 mg Intravenous Given 07/27/18 0446)  metoCLOPramide (REGLAN) injection 10 mg (10 mg Intravenous Given 07/27/18 0446)     ED Discharge Orders         Ordered    ketorolac (TORADOL) 10 MG tablet  Every 6 hours PRN     07/27/18 0607    azithromycin (ZITHROMAX) 500 MG tablet  Daily     07/27/18 0607           Note:  This document was prepared using Dragon voice recognition software and may include unintentional dictation errors.   Gregor Hams, MD 07/28/18 [phone]

## 2018-07-27 NOTE — ED Triage Notes (Signed)
Pt arrives POV to triage with c/o HA since 0100 or 0200 pt is unsure of which time. Pt is ambulatory with steady gait at this time and is in NAD.

## 2018-07-27 NOTE — ED Notes (Signed)
Pt states that he has had a headache since this morning.

## 2018-07-29 ENCOUNTER — Telehealth: Payer: Self-pay | Admitting: Gastroenterology

## 2018-07-29 NOTE — Telephone Encounter (Signed)
Left vm to r/s apt due to corona virus and offer televisit

## 2018-08-05 ENCOUNTER — Emergency Department: Payer: Medicare HMO

## 2018-08-05 ENCOUNTER — Telehealth: Payer: Self-pay | Admitting: Internal Medicine

## 2018-08-05 ENCOUNTER — Encounter: Payer: Self-pay | Admitting: Emergency Medicine

## 2018-08-05 ENCOUNTER — Emergency Department
Admission: EM | Admit: 2018-08-05 | Discharge: 2018-08-05 | Disposition: A | Discharge status: Home / Self Care | Payer: Medicare HMO | Attending: Emergency Medicine | Admitting: Emergency Medicine

## 2018-08-05 ENCOUNTER — Other Ambulatory Visit: Payer: Self-pay

## 2018-08-05 ENCOUNTER — Telehealth: Payer: Self-pay

## 2018-08-05 DIAGNOSIS — R109 Unspecified abdominal pain: Secondary | ICD-10-CM

## 2018-08-05 DIAGNOSIS — I10 Essential (primary) hypertension: Secondary | ICD-10-CM | POA: Diagnosis not present

## 2018-08-05 DIAGNOSIS — I2510 Atherosclerotic heart disease of native coronary artery without angina pectoris: Secondary | ICD-10-CM | POA: Diagnosis not present

## 2018-08-05 DIAGNOSIS — R1084 Generalized abdominal pain: Secondary | ICD-10-CM | POA: Diagnosis not present

## 2018-08-05 DIAGNOSIS — Z7982 Long term (current) use of aspirin: Secondary | ICD-10-CM | POA: Diagnosis not present

## 2018-08-05 DIAGNOSIS — Z79899 Other long term (current) drug therapy: Secondary | ICD-10-CM | POA: Diagnosis not present

## 2018-08-05 DIAGNOSIS — R0602 Shortness of breath: Secondary | ICD-10-CM | POA: Diagnosis not present

## 2018-08-05 DIAGNOSIS — J449 Chronic obstructive pulmonary disease, unspecified: Secondary | ICD-10-CM | POA: Insufficient documentation

## 2018-08-05 DIAGNOSIS — Z87891 Personal history of nicotine dependence: Secondary | ICD-10-CM | POA: Diagnosis not present

## 2018-08-05 DIAGNOSIS — E119 Type 2 diabetes mellitus without complications: Secondary | ICD-10-CM | POA: Diagnosis not present

## 2018-08-05 DIAGNOSIS — Z7984 Long term (current) use of oral hypoglycemic drugs: Secondary | ICD-10-CM | POA: Insufficient documentation

## 2018-08-05 DIAGNOSIS — R0781 Pleurodynia: Secondary | ICD-10-CM | POA: Diagnosis not present

## 2018-08-05 LAB — CBC WITH DIFFERENTIAL/PLATELET
Abs Immature Granulocytes: 0.01 10*3/uL (ref 0.00–0.07)
Basophils Absolute: 0 10*3/uL (ref 0.0–0.1)
Basophils Relative: 1 %
Eosinophils Absolute: 0.3 10*3/uL (ref 0.0–0.5)
Eosinophils Relative: 4 %
HCT: 42.8 % (ref 39.0–52.0)
Hemoglobin: 13.4 g/dL (ref 13.0–17.0)
Immature Granulocytes: 0 %
LYMPHS PCT: 31 %
Lymphs Abs: 2.1 10*3/uL (ref 0.7–4.0)
MCH: 30.7 pg (ref 26.0–34.0)
MCHC: 31.3 g/dL (ref 30.0–36.0)
MCV: 98.2 fL (ref 80.0–100.0)
MONOS PCT: 7 %
Monocytes Absolute: 0.5 10*3/uL (ref 0.1–1.0)
Neutro Abs: 3.7 10*3/uL (ref 1.7–7.7)
Neutrophils Relative %: 57 %
Platelets: 106 10*3/uL — ABNORMAL LOW (ref 150–400)
RBC: 4.36 MIL/uL (ref 4.22–5.81)
RDW: 15.9 % — ABNORMAL HIGH (ref 11.5–15.5)
WBC: 6.5 10*3/uL (ref 4.0–10.5)
nRBC: 0 % (ref 0.0–0.2)

## 2018-08-05 LAB — COMPREHENSIVE METABOLIC PANEL
ALK PHOS: 51 U/L (ref 38–126)
ALT: 16 U/L (ref 0–44)
AST: 18 U/L (ref 15–41)
Albumin: 3.3 g/dL — ABNORMAL LOW (ref 3.5–5.0)
Anion gap: 8 (ref 5–15)
BUN: 16 mg/dL (ref 8–23)
CO2: 30 mmol/L (ref 22–32)
Calcium: 8.2 mg/dL — ABNORMAL LOW (ref 8.9–10.3)
Chloride: 100 mmol/L (ref 98–111)
Creatinine, Ser: 0.69 mg/dL (ref 0.61–1.24)
GFR calc Af Amer: >60 mL/min (ref >60)
GFR calc non Af Amer: >60 mL/min (ref >60)
Glucose, Bld: 136 mg/dL — ABNORMAL HIGH (ref 70–99)
Potassium: 4 mmol/L (ref 3.5–5.1)
Sodium: 138 mmol/L (ref 135–145)
Total Bilirubin: 0.5 mg/dL (ref 0.3–1.2)
Total Protein: 5.8 g/dL — ABNORMAL LOW (ref 6.5–8.1)

## 2018-08-05 LAB — URINALYSIS, ROUTINE W REFLEX MICROSCOPIC
Bacteria, UA: NONE SEEN
Bilirubin Urine: NEGATIVE
Glucose, UA: NEGATIVE mg/dL
Hgb urine dipstick: NEGATIVE
Ketones, ur: NEGATIVE mg/dL
Leukocytes,Ua: NEGATIVE
Nitrite: NEGATIVE
Protein, ur: NEGATIVE mg/dL
SQUAMOUS EPITHELIAL / LPF: NONE SEEN (ref 0–5)
Specific Gravity, Urine: 1.019 (ref 1.005–1.030)
pH: 7 (ref 5.0–8.0)

## 2018-08-05 LAB — BRAIN NATRIURETIC PEPTIDE: B Natriuretic Peptide: 18 pg/mL (ref 0.0–100.0)

## 2018-08-05 LAB — TROPONIN I: Troponin I: <0.03 ng/mL (ref <0.03)

## 2018-08-05 LAB — LIPASE, BLOOD: Lipase: 33 U/L (ref 11–51)

## 2018-08-05 LAB — ETHANOL: Alcohol, Ethyl (B): <10 mg/dL (ref <10)

## 2018-08-05 MED ORDER — KETOROLAC TROMETHAMINE 30 MG/ML IJ SOLN
15.0000 mg | Freq: Once | INTRAMUSCULAR | Status: AC
  Administered 2018-08-05: 15 mg via INTRAVENOUS
  Filled 2018-08-05: qty 1

## 2018-08-05 NOTE — Discharge Instructions (Signed)

## 2018-08-05 NOTE — Telephone Encounter (Signed)
If another provider agreeable to see him he just went to ED  I say for now we need to limit his visits to ED or our office and do televisits, my chart or phone visits  If he wants to schedule one of these visits with me weds, Thursday or Friday work him in with me   Thanks [employer]

## 2018-08-05 NOTE — ED Triage Notes (Signed)
Pt reports bilateral flank pain for a few hours; has not taken anything at home for the pain; pt denies N/V/D; reports dark urine at home, did not notice an odor;

## 2018-08-05 NOTE — ED Provider Notes (Signed)
[facility] Emergency Department Provider Note  ____________________________________________  Patient seen and evaluated by MD at approximately 2:15 AM   I have reviewed the triage vital signs and the nursing notes.   HISTORY  Chief Complaint No chief complaint on file.    HPI Nicholas Reyes is a 63 y.o. male with medical history as listed below with extensive chronic medical issues and frequent visits to the emergency department who presents by private vehicle for evaluation of pain in bilateral sides.  He does not describe the pain as being in his flanks but reports that it is in the right side of his ribs and is sometimes it moves over to the left side.  It is been present for a few hours and he wondered if he had a urinary tract infection.  He says that his urine has been dark but has not been malodorous.  He has been evaluated extensively in the past for possible kidney stones  (see hospital course for details) and has had UTIs in the past.  He denies dysuria.  Although he is chronically short of breath, he reports that his shortness of breath is no worse than usual.  He has had a CTA chest within the last couple of weeks and there is no evidence of any pulmonary embolism.  He denies chest pain.  He says that the pain in his sides is sharp and he changes positions and nothing in particular makes it better or worse.  He says the pain in his sides is moderate.  He has not been traveling recently and has not been in contact with anyone known to have COVID-19.  He denies fever/chills, nasal congestion, sore throat, chest pain, worsening shortness of breath over his baseline, worsening cough over his baseline, abdominal pain except for the bilateral rib pain as described.          Past Medical History:  Diagnosis Date  . Arthritis   . CAD (coronary artery disease)   . COPD (chronic obstructive pulmonary disease) ([place])   . Depression   . Diabetes mellitus   .  Heart disease    cad with 3 stents   . Hypertension   . MI (myocardial infarction) ([place])   . Migraines   . Psoriasis    joint pain in knees left hand     Patient Active Problem List   Diagnosis Date Noted  . Enteritis 04/10/2018  . 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 Chronic L5-S1 lumbar pars defect (Left) 03/27/2018  . Grade 1 Anterolisthesis of L5 over S1 03/27/2018  . Chronic knee pain (Right) 03/27/2018  . Chronic knee pain (Left) 03/27/2018  . Chronic patellofemoral knee pain (Left) 03/27/2018  . History of nephrolithiasis 03/27/2018  . Right flank pain 02/13/2018  . Osteoarthritis of knee (Bilateral) (R>L) 01/28/2018  . Tricompartment osteoarthritis of knee (Right) 01/28/2018  . Osteoarthritis of hands (Bilateral) 01/28/2018  . Chronic anticoagulation (PLAVIX) 01/28/2018  . Chronic knee pain (Primary Area of Pain) (Bilateral) (R>L) 01/02/2018  . Chronic hand pain (Secondary Area of Pain) (Bilateral) (L>R) 01/02/2018  . Chronic pain syndrome 01/02/2018  . Long term current use of opiate analgesic 01/02/2018  . Pharmacologic therapy 01/02/2018  . Disorder of skeletal system 01/02/2018  . Problems influencing health status 01/02/2018  . Cognitive impairment 12/11/2017  . DM2 (diabetes mellitus, type 2) ([place]) 12/04/2017  . Psoriasis 11/15/2017  . Skin sore 11/15/2017  . Diabetes mellitus without complication ([place]) 00/86/7619  . Vitamin D deficiency  11/15/2017  . Essential hypertension 06/11/2017  . Coronary artery disease involving native coronary artery of native heart without angina pectoris 06/11/2017  . Exercise-induced angina ([place]) 01/31/2016  . Hypertension 05/18/2014  . Edema 02/09/2014  . Mixed hyperlipidemia 04/02/2013  . HTN (hypertension) 04/02/2013  . Chest pain with high risk for cardiac etiology 12/12/2012  . Dyspnea 12/12/2012  . Infective otitis externa 09/25/2012  . Coronary atherosclerosis 09/02/2012  . Otalgia 09/02/2012  . Benign essential  hypertension 08/02/2012  . Chronic airway obstruction, not elsewhere classified 08/02/2012  . Depressive disorder, not elsewhere classified 08/02/2012  . Family history of ischemic heart disease (IHD) 07/08/2012  . Family history of ischemic heart disease and other diseases of the circulatory system 07/08/2012  . Allergic rhinitis 07/05/2012  . Other psoriasis 07/05/2012  . Benign neoplasm of colon 09/22/2010  . Arthritis 06/09/2010  . Asthma 06/09/2010  . Diarrhea 06/09/2010  . Headaches due to old head injury 06/09/2010  . Type 2 diabetes mellitus without complications ([place]) 03/47/4259  . Abdominal pain 06/09/2010    Past Surgical History:  Procedure Laterality Date  . APPENDECTOMY    . CATARACT EXTRACTION Bilateral    inserted lens b/l eyes right 03/12/17, left 05/28/17   . CHOLECYSTECTOMY    . CORONARY ANGIOPLASTY WITH STENT PLACEMENT     LAD, LCX-OM, ? 3rd location  . EYE SURGERY     cataract b/l   . HEMORRHOID SURGERY    . hemorrhoid surgery      x 2   . KNEE SURGERY     right knee arthroscopy, partial synovectecomy 04/21/15 and meniscectomy/chondroplasty   . LEFT HEART CATH AND CORONARY ANGIOGRAPHY Left 01/30/2018   Procedure: LEFT HEART CATH AND CORONARY ANGIOGRAPHY;  Surgeon: Isaias Cowman, MD;  Location: [facility] [facility];  Service: Cardiovascular;  Laterality: Left;  . right knee surgery     ? type of procedure   . STENT PLACEMENT VASCULAR ([place] HX)    . TONSILLECTOMY    . TONSILLECTOMY AND ADENOIDECTOMY      Prior to Admission medications   Medication Sig Start Date End Date Taking? Authorizing Provider  albuterol (PROVENTIL HFA;VENTOLIN HFA) 108 (90 Base) MCG/ACT inhaler Inhale 2 puffs into the lungs every 4 (four) hours as needed. For shortness of breath 05/14/18  Yes McLean-Scocuzza, Nino Glow, MD  amLODipine (NORVASC) 5 MG tablet Take 5 mg by mouth daily.   Yes [provider]  aspirin 81 MG EC tablet Take 81 mg by mouth daily.    Yes [provider]  atorvastatin (LIPITOR) 20 MG tablet Take 20 mg by mouth daily.   Yes [provider]  budesonide-formoterol (SYMBICORT) 160-4.5 MCG/ACT inhaler Inhale 2 puffs into the lungs 2 (two) times daily. Rinse mouth 05/14/18  Yes McLean-Scocuzza, Nino Glow, MD  cetirizine (ZYRTEC) 10 MG tablet Take 10 mg by mouth daily.   Yes [provider]  Cholecalciferol 50000 units capsule Take 1 capsule (50,000 Units total) by mouth once a week. 12/10/17  Yes McLean-Scocuzza, Nino Glow, MD  clobetasol ointment (TEMOVATE) 5.63 % Apply 1 application topically 2 (two) times daily. Right ear as needed not face or private 04/10/18  Yes McLean-Scocuzza, Nino Glow, MD  diclofenac (FLECTOR) 1.3 % PTCH Place 1 patch onto the skin 2 (two) times daily. 11/30/17  Yes McLean-Scocuzza, Nino Glow, MD  dicyclomine (BENTYL) 10 MG capsule Take 1 capsule (10 mg total) by mouth 4 (four) times daily -  before meals and at bedtime. 06/26/18  Yes Jonathon Bellows, MD  DULoxetine (CYMBALTA) 60 MG capsule Take 1 capsule (60 mg total) by mouth daily. 01/11/18  Yes McLean-Scocuzza, Nino Glow, MD  fluticasone (FLONASE) 50 MCG/ACT nasal spray Place 2 sprays into both nostrils daily. 05/14/18  Yes McLean-Scocuzza, Nino Glow, MD  furosemide (LASIX) 40 MG tablet Take 1 tablet (40 mg total) by mouth 2 (two) times daily. In am and at lunch 07/10/18  Yes McLean-Scocuzza, Nino Glow, MD  hydrocortisone 2.5 % cream Apply topically 2 (two) times daily. Left face left of eye 07/10/18  Yes McLean-Scocuzza, Nino Glow, MD  hydrOXYzine (ATARAX/VISTARIL) 25 MG tablet Take 1 tablet (25 mg total) by mouth 3 (three) times daily as needed. 05/14/18  Yes McLean-Scocuzza, Nino Glow, MD  isosorbide mononitrate (IMDUR) 30 MG 24 hr tablet Take 1 tablet (30 mg total) by mouth daily. 01/11/18  Yes McLean-Scocuzza, Nino Glow, MD  ketorolac (ACULAR) 0.5 % ophthalmic solution  12/03/17  Yes [provider]  meloxicam (MOBIC) 7.5 MG tablet Take 1 tablet (7.5 mg total) by mouth 2  (two) times daily as needed for pain. 07/07/18  Yes McLean-Scocuzza, Nino Glow, MD  metFORMIN (GLUCOPHAGE) 500 MG tablet Take 1 tablet (500 mg total) by mouth daily with breakfast. 11/23/17  Yes McLean-Scocuzza, Nino Glow, MD  metoprolol tartrate (LOPRESSOR) 25 MG tablet Take 25 mg by mouth 2 (two) times daily.   Yes [provider]  montelukast (SINGULAIR) 10 MG tablet Take 10 mg by mouth at bedtime.   Yes [provider]  mupirocin ointment (BACTROBAN) 2 % Apply 1 application topically 2 (two) times daily. Any open sores 07/10/18  Yes McLean-Scocuzza, Nino Glow, MD  nitroGLYCERIN (NITROSTAT) 0.4 MG SL tablet Place 0.4 mg under the tongue every 5 (five) minutes as needed. For chest pain   Yes [provider]  omeprazole (PRILOSEC) 40 MG capsule Take 1 capsule (40 mg total) by mouth daily. 06/26/18 08/27/18 Yes Jonathon Bellows, MD  ondansetron (ZOFRAN) 4 MG tablet Take 1 tablet (4 mg total) by mouth every 8 (eight) hours as needed for nausea or vomiting. 12/19/17  Yes Guse, Jacquelynn Cree, FNP  pantoprazole (PROTONIX) 20 MG tablet TAKE 1 TABLET EVERY DAY 04/29/18   Burnard Hawthorne, FNP  potassium chloride SA (K-DUR,KLOR-CON) 20 MEQ tablet Take 1 tablet (20 mEq total) by mouth daily. 07/10/18   McLean-Scocuzza, Nino Glow, MD  prednisoLONE acetate (PRED FORTE) 1 % ophthalmic suspension  11/30/17   [provider]  Secukinumab (COSENTYX 300 DOSE [place]) Inject into the skin every 30 (thirty) days.    [provider]  spironolactone (ALDACTONE) 25 MG tablet Take 1 tablet (25 mg total) by mouth daily. In am for blood pressure 05/03/18   McLean-Scocuzza, Nino Glow, MD  sucralfate (CARAFATE) 1 g tablet Take 1 tablet (1 g total) by mouth 4 (four) times daily -  with meals and at bedtime. 02/24/18 02/24/19  Merlyn Lot, MD  triamcinolone cream (KENALOG) 0.1 % Apply 1 application topically 2 (two) times daily. Not face, groin, underarms 11/15/17   McLean-Scocuzza, Nino Glow, MD    Allergies  Patient has no known allergies.  Family History  Problem Relation Age of Onset  . Cancer Mother   . Heart disease Father   . Stroke Father   . Heart disease Brother     Social History Social History   Tobacco Use  . Smoking status: Former Research scientist (life sciences)  . Smokeless tobacco: Never Used  Substance Use Topics  . Alcohol use: No  Frequency: Never  . Drug use: No    Review of Systems Constitutional: No fever/chills Eyes: No visual changes. ENT: No sore throat. Cardiovascular: Denies chest pain. Respiratory: Chronic shortness of breath with chronic cough.  Patient states his symptoms are not worse than usual. Gastrointestinal: No abdominal pain.  No nausea, no vomiting.  No diarrhea.  No constipation. Genitourinary: Negative for dysuria.  Patient reports his urine is dark but not malodorous. Musculoskeletal: Pain in bilateral sides although it seems to alternate the location. Integumentary: Negative for rash. Neurological: Negative for headaches, focal weakness or numbness.   ____________________________________________   PHYSICAL EXAM:  VITAL SIGNS: ED Triage Vitals  Enc Vitals Group     BP 08/05/18 0201 (!) 144/73     Pulse Rate 08/05/18 0201 94     Resp 08/05/18 0201 (!) 22     Temp 08/05/18 0201 98.5 F (36.9 C)     Temp Source 08/05/18 0201 Oral     SpO2 08/05/18 0201 (!) 88 %     Weight --      Height 08/05/18 0205 1.702 m (5\' 7" )     Head Circumference --      Peak Flow --      Pain Score 08/05/18 0204 8     Pain Loc --      Pain Edu? --      Excl. in [place]? --     Constitutional: Alert and oriented.  Patient with slight increased work of breathing but this is apparently his baseline.  No acute distress. Eyes: Conjunctivae are normal.  Head: Atraumatic. Nose: No congestion/rhinnorhea. Mouth/Throat: Mucous membranes are moist. Neck: No stridor.  No meningeal signs.   Cardiovascular: Normal rate, regular rhythm. Good peripheral circulation. Grossly normal  heart sounds. Respiratory: Mildly increased work of breathing.  Lung sounds are clear to auscultation but diminished throughout lung fields.  No wheezing.  Patient is speaking in full sentences.  He states he is not worried about his breathing. Gastrointestinal: Soft and nontender. No distention.  Musculoskeletal: Mild and diffuse tenderness to palpation of bilateral lateral ribs.  No specific focal tenderness.  No lower extremity tenderness nor edema. No gross deformities of extremities. Neurologic:  Normal speech and language. No gross focal neurologic deficits are appreciated.  Skin:  Skin is warm, dry and intact. No rash noted. Psychiatric: Mood and affect are generally normal and baseline for the patient although slightly flat and blunted.  ____________________________________________   LABS (all labs ordered are listed, but only abnormal results are displayed)  Labs Reviewed  CBC WITH DIFFERENTIAL/PLATELET - Abnormal; Notable for the following components:      Result Value   RDW 15.9 (*)    Platelets 106 (*)    All other components within normal limits  COMPREHENSIVE METABOLIC PANEL - Abnormal; Notable for the following components:   Glucose, Bld 136 (*)    Calcium 8.2 (*)    Total Protein 5.8 (*)    Albumin 3.3 (*)    All other components within normal limits  URINALYSIS, ROUTINE W REFLEX MICROSCOPIC - Abnormal; Notable for the following components:   Color, Urine YELLOW (*)    APPearance HAZY (*)    All other components within normal limits  TROPONIN I  BRAIN NATRIURETIC PEPTIDE  LIPASE, BLOOD  ETHANOL   ____________________________________________  EKG  ED ECG REPORT I, Hinda Kehr, the attending physician, personally viewed and interpreted this ECG.  Date: 08/05/2018 EKG Time: 2:45 AM Rate: 83 Rhythm: normal sinus rhythm  QRS Axis: Borderline right axis deviation Intervals: normal ST/T Wave abnormalities: Non-specific ST segment / T-wave changes, but no clear  evidence of acute ischemia. Narrative Interpretation: no definitive evidence of acute ischemia; does not meet STEMI criteria.   ____________________________________________  RADIOLOGY I, Hinda Kehr, personally viewed and evaluated these images (plain radiographs) as part of my medical decision making, as well as reviewing the written report by the radiologist.  ED MD interpretation:  No acute intrathoracic abnormality  Official radiology report(s): Dg Chest 2 View  Result Date: 08/05/2018 CLINICAL DATA:  63 year old male with shortness of breath and hypoxemia. EXAM: CHEST - 2 VIEW COMPARISON:  Chest radiograph dated 05/14/2018 and CT dated 07/22/2018 FINDINGS: There is no focal consolidation, pleural effusion, or pneumothorax. Minimal chronic bronchitic changes. The cardiac silhouette is within normal limits. No acute osseous pathology. IMPRESSION: No active cardiopulmonary disease. Electronically Signed   By: Anner Crete M.D.   On: 08/05/2018 03:12    ____________________________________________   PROCEDURES   Procedure(s) performed (including Critical Care):  Procedures   ____________________________________________   INITIAL IMPRESSION / MDM / [place] / ED COURSE  As part of my medical decision making, I reviewed the following data within the [place] notes reviewed and incorporated, Labs reviewed , EKG interpreted , Old chart reviewed, Radiograph reviewed , Notes from prior ED visits and [place] Controlled Substance Database  Nicholas Reyes was evaluated in Emergency Department on 08/05/2018 for the symptoms described in the history of present illness. He was evaluated in the context of the global COVID-19 pandemic, which necessitated consideration that the patient might be at risk for infection with the SARS-CoV-2 virus that causes COVID-19. Institutional protocols and algorithms that pertain to the evaluation of patients at risk for  COVID-19 are in a state of rapid change based on information released by regulatory bodies including the CDC and federal and state organizations. These policies and algorithms were followed during the patient's care in the ED.      Differential diagnosis includes, but is not limited to, urinary tract infection, renal colic, musculoskeletal pain/strain, pulmonary embolism, pneumonia, CHF exacerbation, ACS.  The patient is generally at his baseline and is not in significant distress.  Upon repeated questioning he says that he is not at all worried about his shortness of breath and the only reason he is here is the side pain and his concern about possible urinary tract infection.  I looked through his medical record and read numerous outpatient notes, the recent report of his CTA chest, and reviewed the fact that he has had at least 5 CT scans within the [facility] system of his abdomen and pelvis over the last 10 to 11 months, none of which have shown any acute intra-abdominal pathology or evidence of kidney stones.  His vital signs are notable for some mild hypoxemia upon arrival but I looked again through his medical records and his usual resting oxygen level is about 90 to 91%.  He was 88% in triage but is now 92% on room air.  This seems to be his baseline and he is not concerned about his symptoms.  His recent chest x-ray demonstrated a questionable lingular pneumonia, as did the CTA chest.  He says that he is completed a course of azithromycin.  He is low risk for COVID-19 and I am not concerned about this as a possible explanation for his symptoms.  He does not currently meet any criteria for testing based on current  hospital and state recommendations.  I am obtaining a two-view chest x-ray to compare against the last one on record to see if he has any evidence of worsening pneumonia or congestive heart failure.  We will check a urinalysis and basic lab work.  If everything is at its baseline for him I  think it is appropriate for discharge and outpatient follow-up.  He has a number of outpatient doctors with whom he can follow-up in his main concern with the possibility of urinary tract infection.  Given all the imaging he is received over the last 10 or 11 months I do not think he would benefit from a CT scan and he has no tenderness to palpation of the abdomen.  He understands and agrees with the plan.  I am deferring treatment until I see the results of his lab work because I think Toradol would be an appropriate choice for him but I want to check his renal function first.  Clinical Course as of Aug 04 401  Mon Aug 05, 2018  0249 WBC: 6.5 [CF]  0249 reassuring CBC  CBC with Differential/Platelet(!) [CF]  0092 The patient is well-appearing and ambulating around his room and out into the emergency department ask about his results.  He is not any more short of breath and he is at baseline.  His work-up results are reassuring with a normal conference of metabolic panel and no leukocytosis.  His chest x-ray shows improvement from prior which is consistent with a resolved mild pneumonia.  His urinalysis is normal with no sign of infection or hematuria.  I am giving him Toradol 15 mg IV for what is most likely musculoskeletal pain.  He is in agreement with the plan and I provided the reassurance of his work-up today.  I encouraged him to follow-up as an outpatient.  I gave my usual and customary return precautions.   [CF]    Clinical Course User Index [CF] Hinda Kehr, MD    ____________________________________________  FINAL CLINICAL IMPRESSION(S) / ED DIAGNOSES  Final diagnoses:  Side pain     MEDICATIONS GIVEN DURING THIS VISIT:  Medications  ketorolac (TORADOL) 30 MG/ML injection 15 mg (15 mg Intravenous Given 08/05/18 0340)     ED Discharge Orders    None       Note:  This document was prepared using Dragon voice recognition software and may include unintentional dictation  errors.   Hinda Kehr, MD 08/05/18 [phone]

## 2018-08-05 NOTE — Telephone Encounter (Signed)
I cant given him stronger pain medications for this and if he is having chest pain he needs to see the heart doctor   Melissa what is going on with heart doc appt and new pain clinic appt ?   We would not be able to help him if he came to the clinic for either of these reasons he needs to see specialist   [place]

## 2018-08-05 NOTE — ED Notes (Signed)
md at bedside

## 2018-08-05 NOTE — Telephone Encounter (Signed)
What do you advise, since patient went to ED today.

## 2018-08-05 NOTE — Telephone Encounter (Signed)
Patient called back to say that at the ED they gave him a Torodol shot that took the pain away 30 minutes. Stated that the heart doctor cancelled his appointment and that he has fluid around his heart that causes pain in his chest. Per patient he need some strong pain medicine please. Ph# [phone]

## 2018-08-05 NOTE — Telephone Encounter (Signed)
Left message for sister of patient to return call back. PEC may give and obtain information.

## 2018-08-05 NOTE — Telephone Encounter (Signed)
Copied from [place] [id number]. Topic: Quick Communication - See Telephone Encounter >> Aug 05, 2018  6:55 PM Blase Mess A wrote: CRM for notification. See Telephone encounter for: 08/05/18.  Patient expressed discontentment about not being able to see Dr. Linus Orn today.  The patient states that he is not able to see his cardiologist because the Appt was put off until May 2020.  The patient states that he will be seen tomorrow. Please advise. Thank you

## 2018-08-05 NOTE — Telephone Encounter (Signed)
Copied from [place] [id number]. Topic: Appointment Scheduling - Scheduling Inquiry for Clinic >> Aug 05, 2018 10:34 AM Alanda Slim E wrote: Reason for CRM: Pt asked for an appt this afternoon. Didn't state for what issue and asked for Dr. To call his sister @ [phone] please call Pt to advise

## 2018-08-05 NOTE — Telephone Encounter (Signed)
Why does he need appt?  We can schedule him a televisit, phone visit for now with me when I am available  We need to try to limit his visits to the ED and elsewhere if not needed   [place]

## 2018-08-05 NOTE — ED Notes (Signed)
Reports bilateral flank pain began few hours ago. Denies n/v.

## 2018-08-06 ENCOUNTER — Ambulatory Visit (INDEPENDENT_AMBULATORY_CARE_PROVIDER_SITE_OTHER): Payer: Medicare HMO | Admitting: Internal Medicine

## 2018-08-06 ENCOUNTER — Telehealth (INDEPENDENT_AMBULATORY_CARE_PROVIDER_SITE_OTHER): Payer: Medicare HMO | Admitting: Internal Medicine

## 2018-08-06 ENCOUNTER — Encounter: Payer: Self-pay | Admitting: Internal Medicine

## 2018-08-06 ENCOUNTER — Ambulatory Visit: Payer: Self-pay | Admitting: Internal Medicine

## 2018-08-06 DIAGNOSIS — S0990XS Unspecified injury of head, sequela: Secondary | ICD-10-CM

## 2018-08-06 DIAGNOSIS — M25561 Pain in right knee: Secondary | ICD-10-CM

## 2018-08-06 DIAGNOSIS — M25562 Pain in left knee: Secondary | ICD-10-CM | POA: Diagnosis not present

## 2018-08-06 DIAGNOSIS — G8929 Other chronic pain: Secondary | ICD-10-CM | POA: Diagnosis not present

## 2018-08-06 DIAGNOSIS — R079 Chest pain, unspecified: Secondary | ICD-10-CM

## 2018-08-06 DIAGNOSIS — M79641 Pain in right hand: Secondary | ICD-10-CM

## 2018-08-06 DIAGNOSIS — G894 Chronic pain syndrome: Secondary | ICD-10-CM

## 2018-08-06 DIAGNOSIS — M79642 Pain in left hand: Secondary | ICD-10-CM

## 2018-08-06 DIAGNOSIS — G44309 Post-traumatic headache, unspecified, not intractable: Secondary | ICD-10-CM

## 2018-08-06 NOTE — Telephone Encounter (Signed)
His cardiology doctor ordered an echo on 3/4, once they get this scheduled he is to follow up within one week with them to go over the results  The new pain referral states below: "Called patient, could not leave a message b/c his VMB was full. 07/30/18 at 8:24 AM." [facility] said 7 days ago  "2nd attempt to contact patient, message says "not accepting calls at this time." 07/31/18 at 10:35 AM." [facility] said 6 days ago

## 2018-08-06 NOTE — Telephone Encounter (Signed)
I sent him to Dr. Candelaria Stagers at [facility] for her [phone]

## 2018-08-06 NOTE — Progress Notes (Signed)
Entered in error  [place]

## 2018-08-06 NOTE — Telephone Encounter (Signed)
Telephone visit/Note  I connected with Nicholas Reyes and his sister [name] (she resides in [place]) TODAY 3:05-3:15 pm and :3:17-3/25 pm at  by a telephone enabled telemedicine application and verified that I am speaking with the correct person using two identifiers.  Location patient: home, sister at her home in [place]  Location provider:work  Persons participating in the virtual visit: patient, provider, [name] Pts sister   I discussed the limitations of evaluation and management by telemedicine and the availability of in person appointments. The patient expressed understanding and agreed to proceed.   HPI: He has had several ED visits in 07/27/2018 for headache and 08/05/2018 for side pain. Imaging I.e CXR normal labs normal yesterday he was given Toradol 15 mg x 1 and pain improved. He reports he has been taking BC powder for pain at times 4 at a time. He also has diclofenac patches. I discussed Lidocaine otc pain patches with pts sister for chronic pain but she c/w pts income is low and cant afford. He has chronic pain due to OA in knees and hands I disc with pt Tylenol is safer and we have referral to [facility] pain clinic/[facility] clinic they have attempted to call him several time w/o success and gave him the # today to call [phone]  -He does have chronic back, knee and hand pain  Chest pain on and off 08/05/2018 troponin negative reassured pt this is good sign and given cardiology [facility] phone # to call [phone]. He reports they canceled his echo echo was to w/u small to moderate pericardial effusion he will try to call cardiology to reschedule appts but advised likely cancelled 2/2 COVID 19   -currently today no chest pain   COVID discussion disc with pt impt of not going to ED as much and if urgent medical issue call 911 and also try to stay In the house as much as possible. He understood   ROS: See pertinent positives and negatives per HPI.  Past Medical History:   Diagnosis Date  . Arthritis   . CAD (coronary artery disease)   . COPD (chronic obstructive pulmonary disease) ([place])   . Depression   . Diabetes mellitus   . Heart disease    cad with 3 stents   . Hypertension   . MI (myocardial infarction) ([place])   . Migraines   . Psoriasis    joint pain in knees left hand     Past Surgical History:  Procedure Laterality Date  . APPENDECTOMY    . CATARACT EXTRACTION Bilateral    inserted lens b/l eyes right 03/12/17, left 05/28/17   . CHOLECYSTECTOMY    . CORONARY ANGIOPLASTY WITH STENT PLACEMENT     LAD, LCX-OM, ? 3rd location  . EYE SURGERY     cataract b/l   . HEMORRHOID SURGERY    . hemorrhoid surgery      x 2   . KNEE SURGERY     right knee arthroscopy, partial synovectecomy 04/21/15 and meniscectomy/chondroplasty   . LEFT HEART CATH AND CORONARY ANGIOGRAPHY Left 01/30/2018   Procedure: LEFT HEART CATH AND CORONARY ANGIOGRAPHY;  Surgeon: Isaias Cowman, MD;  Location: [facility];  Service: Cardiovascular;  Laterality: Left;  . right knee surgery     ? type of procedure   . STENT PLACEMENT VASCULAR ([employer] HX)    . TONSILLECTOMY    . TONSILLECTOMY AND ADENOIDECTOMY      Family History  Problem Relation Age  of Onset  . Cancer Mother   . Heart disease Father   . Stroke Father   . Heart disease Brother     SOCIAL HX: does not work, lives alone sister Blanch Media lives in [place]. He loves animals   Current Outpatient Medications:  .  albuterol (PROVENTIL HFA;VENTOLIN HFA) 108 (90 Base) MCG/ACT inhaler, Inhale 2 puffs into the lungs every 4 (four) hours as needed. For shortness of breath, Disp: 3 Inhaler, Rfl: 4 .  amLODipine (NORVASC) 5 MG tablet, Take 5 mg by mouth daily., Disp: , Rfl:  .  aspirin 81 MG EC tablet, Take 81 mg by mouth daily. , Disp: , Rfl:  .  atorvastatin (LIPITOR) 20 MG tablet, Take 20 mg by mouth daily., Disp: , Rfl:  .  budesonide-formoterol (SYMBICORT) 160-4.5 MCG/ACT inhaler, Inhale 2 puffs into  the lungs 2 (two) times daily. Rinse mouth, Disp: 1 Inhaler, Rfl: 12 .  cetirizine (ZYRTEC) 10 MG tablet, Take 10 mg by mouth daily., Disp: , Rfl:  .  Cholecalciferol 50000 units capsule, Take 1 capsule (50,000 Units total) by mouth once a week., Disp: 13 capsule, Rfl: 1 .  clobetasol ointment (TEMOVATE) 9.60 %, Apply 1 application topically 2 (two) times daily. Right ear as needed not face or private, Disp: 45 g, Rfl: 0 .  diclofenac (FLECTOR) 1.3 % PTCH, Place 1 patch onto the skin 2 (two) times daily., Disp: 60 patch, Rfl: 2 .  dicyclomine (BENTYL) 10 MG capsule, Take 1 capsule (10 mg total) by mouth 4 (four) times daily -  before meals and at bedtime., Disp: 90 capsule, Rfl: 0 .  DULoxetine (CYMBALTA) 60 MG capsule, Take 1 capsule (60 mg total) by mouth daily., Disp: 90 capsule, Rfl: 3 .  fluticasone (FLONASE) 50 MCG/ACT nasal spray, Place 2 sprays into both nostrils daily., Disp: 16 g, Rfl: 6 .  furosemide (LASIX) 40 MG tablet, Take 1 tablet (40 mg total) by mouth 2 (two) times daily. In am and at lunch, Disp: 60 tablet, Rfl: 5 .  hydrocortisone 2.5 % cream, Apply topically 2 (two) times daily. Left face left of eye, Disp: 60 g, Rfl: 0 .  hydrOXYzine (ATARAX/VISTARIL) 25 MG tablet, Take 1 tablet (25 mg total) by mouth 3 (three) times daily as needed., Disp: 30 tablet, Rfl: 0 .  isosorbide mononitrate (IMDUR) 30 MG 24 hr tablet, Take 1 tablet (30 mg total) by mouth daily., Disp: 90 tablet, Rfl: 3 .  ketorolac (ACULAR) 0.5 % ophthalmic solution, , Disp: , Rfl:  .  meloxicam (MOBIC) 7.5 MG tablet, Take 1 tablet (7.5 mg total) by mouth 2 (two) times daily as needed for pain., Disp: 60 tablet, Rfl: 2 .  metFORMIN (GLUCOPHAGE) 500 MG tablet, Take 1 tablet (500 mg total) by mouth daily with breakfast., Disp: 90 tablet, Rfl: 3 .  metoprolol tartrate (LOPRESSOR) 25 MG tablet, Take 25 mg by mouth 2 (two) times daily., Disp: , Rfl:  .  montelukast (SINGULAIR) 10 MG tablet, Take 10 mg by mouth at bedtime.,  Disp: , Rfl:  .  mupirocin ointment (BACTROBAN) 2 %, Apply 1 application topically 2 (two) times daily. Any open sores, Disp: 60 g, Rfl: 2 .  nitroGLYCERIN (NITROSTAT) 0.4 MG SL tablet, Place 0.4 mg under the tongue every 5 (five) minutes as needed. For chest pain, Disp: , Rfl:  .  omeprazole (PRILOSEC) 40 MG capsule, Take 1 capsule (40 mg total) by mouth daily., Disp: 90 capsule, Rfl: 3 .  ondansetron (ZOFRAN) 4 MG  tablet, Take 1 tablet (4 mg total) by mouth every 8 (eight) hours as needed for nausea or vomiting., Disp: 20 tablet, Rfl: 0 .  pantoprazole (PROTONIX) 20 MG tablet, TAKE 1 TABLET EVERY DAY, Disp: 90 tablet, Rfl: 0 .  potassium chloride SA (K-DUR,KLOR-CON) 20 MEQ tablet, Take 1 tablet (20 mEq total) by mouth daily., Disp: 90 tablet, Rfl: 1 .  prednisoLONE acetate (PRED FORTE) 1 % ophthalmic suspension, , Disp: , Rfl:  .  Secukinumab (COSENTYX 300 DOSE [place]), Inject into the skin every 30 (thirty) days., Disp: , Rfl:  .  spironolactone (ALDACTONE) 25 MG tablet, Take 1 tablet (25 mg total) by mouth daily. In am for blood pressure, Disp: 90 tablet, Rfl: 3 .  sucralfate (CARAFATE) 1 g tablet, Take 1 tablet (1 g total) by mouth 4 (four) times daily -  with meals and at bedtime., Disp: 120 tablet, Rfl: 0 .  triamcinolone cream (KENALOG) 0.1 %, Apply 1 application topically 2 (two) times daily. Not face, groin, underarms, Disp: 454 g, Rfl: 11  EXAM: unable to do telephone visit   VITALS per patient if applicable:  GENERAL: alert, oriented, appears well and in no acute distress. Baseline mental status with mild cognitive deficit  PSYCH/NEURO: pleasant and cooperative, no obvious depression or anxiety, speech and thought processing grossly intact  ASSESSMENT AND PLAN:  Discussed the following assessment and plan:  1.  Chronic knee pain (Primary Area of Pain) (Bilateral) (R>L) Chronic hand pain (Secondary Area of Pain) (Bilateral) (L>R) Chronic pain syndrome -he had diclofenac patches  prn and rec prn Tylenol stop otc BC powder taking 4 per day  -disc with sister Blanch Media Lidocaine pain patches  -pending appt with [facility] clinic [place] given # to call as they have tried multiple times to reach pt [phone]   2. Chest pain with high risk for cardiac etiology-no active chest pain at this time  -echo to w/u small to moderate pericardial effusion noted on CT chest - rescheduled by cards per pt given # for pt to call cardiology again [phone]   3. Headaches due to old head injury resolved for now ed visit 07/2018 for h/a   Due to COVID 19 advised pt to reduce ED visits and we are doing video, telephone and my chart visits at this time if urgent issue call 911   Discussed with patient and sister Blanch Media at different times and sister Blanch Media will call the patient to reinforce    I discussed the assessment and treatment plan with the patient. The patient was provided an opportunity to ask questions and all were answered. The patient agreed with the plan and demonstrated an understanding of the instructions.   The patient was advised to call back or seek an in-person evaluation if the symptoms worsen or if the condition fails to improve as anticipated.  I provided 15 minutes of non-face-to-face time during this encounter.   Nino Glow McLean-Scocuzza, MD

## 2018-08-06 NOTE — Telephone Encounter (Signed)
Left message for patient to return call back. [facility] may give and obtain information. Dr Olivia Mackie stated that due to his sx she can not see him. There is nothing she can do.  He needs to be seen by his specialist pain management and cardiologist.  Due to not seeing people in office, he can set up a telephone appointment.

## 2018-08-06 NOTE — Telephone Encounter (Signed)
May I phone # to pain clinic of new pain clinic?  Thanks [employer]

## 2018-08-06 NOTE — Telephone Encounter (Signed)
Opened to see reason for call from Dr. McLean-Scocuzza's office.    I had the agent refer it to [facility] in the office because I was not sure what to advise the pt.

## 2018-08-07 ENCOUNTER — Ambulatory Visit: Payer: Medicare HMO | Admitting: Gastroenterology

## 2018-08-08 ENCOUNTER — Telehealth: Payer: Self-pay | Admitting: Internal Medicine

## 2018-08-08 ENCOUNTER — Ambulatory Visit: Payer: Medicare HMO | Admitting: Gastroenterology

## 2018-08-08 ENCOUNTER — Telehealth: Payer: Self-pay

## 2018-08-08 DIAGNOSIS — M659 Synovitis and tenosynovitis, unspecified: Secondary | ICD-10-CM | POA: Diagnosis not present

## 2018-08-08 DIAGNOSIS — M25561 Pain in right knee: Secondary | ICD-10-CM | POA: Diagnosis not present

## 2018-08-08 DIAGNOSIS — M25562 Pain in left knee: Secondary | ICD-10-CM | POA: Diagnosis not present

## 2018-08-08 DIAGNOSIS — G8929 Other chronic pain: Secondary | ICD-10-CM | POA: Diagnosis not present

## 2018-08-08 DIAGNOSIS — M25532 Pain in left wrist: Secondary | ICD-10-CM | POA: Diagnosis not present

## 2018-08-08 DIAGNOSIS — M170 Bilateral primary osteoarthritis of knee: Secondary | ICD-10-CM | POA: Diagnosis not present

## 2018-08-08 NOTE — Telephone Encounter (Signed)
Copied from [place] [phone]. Topic: General - Other >> Aug 07, 2018  4:54 PM Marin Olp L wrote: Reason for CRM: Patient would like to update his DPR. Would like a call back

## 2018-08-08 NOTE — Telephone Encounter (Signed)
Called sister Marlowe Kays today advised CT chest 07/22/2018 shows small to mod pericardial effusion echo is pending and cards f/u pending due to [place] both sisters Blanch Media and Marlowe Kays to try to talk with pt to keep him out of the ED and stay at home for now   Nicholas Reyes

## 2018-08-09 NOTE — Telephone Encounter (Signed)
This has been completed.

## 2018-08-14 ENCOUNTER — Telehealth: Payer: Self-pay | Admitting: Internal Medicine

## 2018-08-14 NOTE — Telephone Encounter (Signed)
sch f/u 01/2019 or 02/2019   Thanks [place]

## 2018-08-20 ENCOUNTER — Encounter: Payer: Self-pay | Admitting: *Deleted

## 2018-08-20 ENCOUNTER — Other Ambulatory Visit: Payer: Self-pay | Admitting: *Deleted

## 2018-08-20 ENCOUNTER — Telehealth: Payer: Self-pay

## 2018-08-20 NOTE — Telephone Encounter (Signed)
Judson Roch, RN w/ [facility] called on behalf of pt stating that pt had contacted his dermatologist to schedule an appt for sores on his face but pt was told that dermatology office is not seeing patients at this time and referred pt back to Primary Care PCP.  This CMA consulted with Dr. Aundra Dubin PCP who advised pt to follow-up with [facility] and requested CMA to ask this office's referral coordinator to schedule appt for pt.  Informed Sarah, RN of Dr. Claris Gladden advisement.  Will forward note to referral coordinator to schedule appt.

## 2018-08-20 NOTE — Patient Outreach (Signed)
[facility]) Care Management  08/20/2018  Nicholas Reyes 02/07/1956 [id number]   [place]  Referral Date:02/25/2018 Referral Source:[facility] ED Census Reason for Referral:6 or more ED visits in the past 6 months Insurance:[employer] Medicare & Medicaid   Outreach Attempt:  Outreach attempt #1 to patient for monthly follow up. No answer. RN Health Coach left HIPAA compliant voicemail message along with contact information.  Plan:  RN Health Coach will make another outreach attempt within the month of May if no return call back from patient.  [name] [phone] [name]

## 2018-08-20 NOTE — Patient Outreach (Addendum)
[facility]) Care Management  08/20/2018  KASHTON MCARTOR 09/30/1955 [id number]   [place]  Referral Date:02/25/2018 Referral Source:[facility] ED Census Reason for Referral:6 or more ED visits in the past 6 months Insurance:[employer] Medicare & Medicaid   Outreach Attempt:   Received telephone call back from patient.  HIPAA verified with patient.  Patient stating he is doing ok.  Reports staying in the house and practicing social distancing.  Does report he has to go out and get groceries for himself at this time.  Has not monitored his blood sugar yet today.  Reports fasting blood sugar yesterday was 107.  Patient reporting he has sores on his face, beside his eye, neck, and elbow.  States primary care provider has seen them and prescribed a cream, but sores are not getting better.  Also reporting continued pain in his sides.  Encouraged patient to contact primary care office for appointment (Telehealth or in person).  Patient stated he would contact primary care.  At that point, patient hung up telephone.  Further telephone assessment/conversation could not continue.  Appointments:  Patient and sister had Telehealth appointment with Dr. Terese Door on 08/06/2018.  Needs to schedule Cardiology and echocardiogram appointment when Cardiology is seeing patients again.  Plan:  RN Health Coach will make next telephone outreach to patient within the month of May.   Addendum:  Received incoming call/message from patient stating "Dr. Aundra Dubin has not called me back".  Outreached to patient and HIPAA confirmed with patient.  RN Health Coach suggested conference call to primary care office.  Conference call to Dr. McLean-Scocuzza's office.  Patient able to speak with primary care's assistant concerning sores.  Patient referred to his dermatologist and states he has the number and will give them a call.  Addendum:  Received incoming call from patient.  HIPAA  verified with patient.  Patient stating he contacted his dermatologist office and was told they were not seeing patients at this time and that he needed to follow up with his primary care provider.  Patient frustrated and request assistance with whom to contact concerning his sores.  RN Health Coach contacted, Dr. McLean-Scocuzza's office.  Spoke with Butch Penny CMA who states she will send request for Melissa, Referral Coordinator to contact patient and try and arrange appointment with Dermatologist.  Hubert Azure RN [place] [phone] [email or web address]

## 2018-09-03 ENCOUNTER — Ambulatory Visit (INDEPENDENT_AMBULATORY_CARE_PROVIDER_SITE_OTHER): Payer: Medicare HMO | Admitting: Gastroenterology

## 2018-09-03 ENCOUNTER — Telehealth: Payer: Self-pay

## 2018-09-03 DIAGNOSIS — R1013 Epigastric pain: Secondary | ICD-10-CM | POA: Diagnosis not present

## 2018-09-03 NOTE — Progress Notes (Signed)
Nicholas Reyes , MD [address]  [place], [place] [address]  Main: [phone]  Fax: [phone]   Primary Care Physician: Nicholas Reyes, Nicholas Glow, MD  Virtual Visit via Telephone Note  I connected with patient on 09/03/18 at  9:00 AM EDT by telephone and verified that I am speaking with the correct person using two identifiers.   I discussed the limitations, risks, security and privacy concerns of performing an evaluation and management service by telephone and the availability of in person appointments. I also discussed with the patient that there may be a patient responsible charge related to this service. The patient expressed understanding and agreed to proceed.  Location of Patient: Home Location of Provider: Home Persons involved: Patient and provider only   History of Present Illness: Chief Complaint  Patient presents with  . Follow-up    Abdominal pain    HPI: Nicholas Reyes is a 63 y.o. male   Summary of history :  He is  here today to see me for a follow-up for abdominal pain . He presented to the emergency room on 04/09/2018 with abdominal pain of one-week duration. CT scan of the abdomen was performed which showed a mildly dilated loop of jejunum with wall thickening possibly representing enteritis/dysmotility/partial obstruction. It is unclear what happened subsequently but I cannot see any other inpatient notes but it appears that he was discharged.  04/09/2018 hemoglobin 14.2 platelet count of 142. Lipase was 34. CMP normal. Urine analysis negative.  Abdominal pain: ongoing for a few months , getting worse , every day , each episode 2-3 hours , Site :central abdominal,Radiation:localized,10/10,sharp,No aggrevating or relieving factors. , no wt loss.NSAID XHB:ZJIR,CVE use :no Gall bladder surgery:taken out Frequency of bowel movements:every day 1-2 times daily Change in bowel movements:no - cant recall results of his  last colonoscopy - he cant recall when he was told to return,Relief with bowel movements:for a few minutes Gas/Bloating/Abdominal distension:no 04/30/2018 : MR enterogram : normal. Small b/l renal cysts.    Interval history  06/26/2018 -09/03/2018   Still has abdominal pain , all over his abdomen , all day long.no better since last visit  Taking his PPI as prescribed.      Current Outpatient Medications  Medication Sig Dispense Refill  . albuterol (PROVENTIL HFA;VENTOLIN HFA) 108 (90 Base) MCG/ACT inhaler Inhale 2 puffs into the lungs every 4 (four) hours as needed. For shortness of breath 3 Inhaler 4  . amLODipine (NORVASC) 5 MG tablet Take 5 mg by mouth daily.    Marland Kitchen aspirin 81 MG EC tablet Take 81 mg by mouth daily.     Marland Kitchen atorvastatin (LIPITOR) 20 MG tablet Take 20 mg by mouth daily.    . budesonide-formoterol (SYMBICORT) 160-4.5 MCG/ACT inhaler Inhale 2 puffs into the lungs 2 (two) times daily. Rinse mouth 1 Inhaler 12  . cetirizine (ZYRTEC) 10 MG tablet Take 10 mg by mouth daily.    . Cholecalciferol 50000 units capsule Take 1 capsule (50,000 Units total) by mouth once a week. 13 capsule 1  . clobetasol ointment (TEMOVATE) 9.38 % Apply 1 application topically 2 (two) times daily. Right ear as needed not face or private 45 g 0  . diclofenac (FLECTOR) 1.3 % PTCH Place 1 patch onto the skin 2 (two) times daily. 60 patch 2  . dicyclomine (BENTYL) 10 MG capsule Take 1 capsule (10 mg total) by mouth 4 (four) times daily -  before meals and at bedtime. 90 capsule  0  . DULoxetine (CYMBALTA) 60 MG capsule Take 1 capsule (60 mg total) by mouth daily. 90 capsule 3  . fluticasone (FLONASE) 50 MCG/ACT nasal spray Place 2 sprays into both nostrils daily. 16 g 6  . furosemide (LASIX) 40 MG tablet Take 1 tablet (40 mg total) by mouth 2 (two) times daily. In am and at lunch 60 tablet 5  . hydrocortisone 2.5 % cream Apply topically 2 (two) times daily. Left face left of eye 60 g 0  . hydrOXYzine  (ATARAX/VISTARIL) 25 MG tablet Take 1 tablet (25 mg total) by mouth 3 (three) times daily as needed. 30 tablet 0  . isosorbide mononitrate (IMDUR) 30 MG 24 hr tablet Take 1 tablet (30 mg total) by mouth daily. 90 tablet 3  . ketorolac (ACULAR) 0.5 % ophthalmic solution     . meloxicam (MOBIC) 7.5 MG tablet Take 1 tablet (7.5 mg total) by mouth 2 (two) times daily as needed for pain. 60 tablet 2  . metFORMIN (GLUCOPHAGE) 500 MG tablet Take 1 tablet (500 mg total) by mouth daily with breakfast. 90 tablet 3  . metoprolol tartrate (LOPRESSOR) 25 MG tablet Take 25 mg by mouth 2 (two) times daily.    . montelukast (SINGULAIR) 10 MG tablet Take 10 mg by mouth at bedtime.    . mupirocin ointment (BACTROBAN) 2 % Apply 1 application topically 2 (two) times daily. Any open sores 60 g 2  . nitroGLYCERIN (NITROSTAT) 0.4 MG SL tablet Place 0.4 mg under the tongue every 5 (five) minutes as needed. For chest pain    . ondansetron (ZOFRAN) 4 MG tablet Take 1 tablet (4 mg total) by mouth every 8 (eight) hours as needed for nausea or vomiting. 20 tablet 0  . pantoprazole (PROTONIX) 20 MG tablet TAKE 1 TABLET EVERY DAY 90 tablet 0  . potassium chloride SA (K-DUR,KLOR-CON) 20 MEQ tablet Take 1 tablet (20 mEq total) by mouth daily. 90 tablet 1  . prednisoLONE acetate (PRED FORTE) 1 % ophthalmic suspension     . spironolactone (ALDACTONE) 25 MG tablet Take 1 tablet (25 mg total) by mouth daily. In am for blood pressure 90 tablet 3  . sucralfate (CARAFATE) 1 g tablet Take 1 tablet (1 g total) by mouth 4 (four) times daily -  with meals and at bedtime. 120 tablet 0  . triamcinolone cream (KENALOG) 0.1 % Apply 1 application topically 2 (two) times daily. Not face, groin, underarms 454 g 11  . omeprazole (PRILOSEC) 40 MG capsule Take 1 capsule (40 mg total) by mouth daily. 90 capsule 3  . Secukinumab (COSENTYX 300 DOSE [place]) Inject into the skin every 30 (thirty) days.     No current facility-administered medications for this  visit.     Allergies as of 09/03/2018  . (No Known Allergies)    Review of Systems:    All systems reviewed and negative except where noted in HPI.   Observations/Objective:  Labs: CMP     Component Value Date/Time   NA 138 08/05/2018 0223   NA 139 06/25/2011 1415   K 4.0 08/05/2018 0223   K 3.5 06/25/2011 1415   CL 100 08/05/2018 0223   CL 101 06/25/2011 1415   CO2 30 08/05/2018 0223   CO2 32 06/25/2011 1415   GLUCOSE 136 (H) 08/05/2018 0223   GLUCOSE 94 06/25/2011 1415   BUN 16 08/05/2018 0223   BUN 7 06/25/2011 1415   CREATININE 0.69 08/05/2018 0223   CREATININE 0.90 06/25/2011 1415  CALCIUM 8.2 (L) 08/05/2018 0223   CALCIUM 8.7 06/25/2011 1415   PROT 5.8 (L) 08/05/2018 0223   PROT 7.6 06/25/2011 1415   ALBUMIN 3.3 (L) 08/05/2018 0223   ALBUMIN 3.5 06/25/2011 1415   AST 18 08/05/2018 0223   AST 17 06/25/2011 1415   ALT 16 08/05/2018 0223   ALT 23 06/25/2011 1415   ALKPHOS 51 08/05/2018 0223   ALKPHOS 56 06/25/2011 1415   BILITOT 0.5 08/05/2018 0223   BILITOT 0.4 06/25/2011 1415   GFRNONAA >60 08/05/2018 0223   GFRNONAA >60 06/25/2011 1415   GFRAA >60 08/05/2018 0223   GFRAA >60 06/25/2011 1415   Lab Results  Component Value Date   WBC 6.5 08/05/2018   HGB 13.4 08/05/2018   HCT 42.8 08/05/2018   MCV 98.2 08/05/2018   PLT 106 (L) 08/05/2018    Imaging Studies: Dg Chest 2 View  Result Date: 08/05/2018 CLINICAL DATA:  63 year old male with shortness of breath and hypoxemia. EXAM: CHEST - 2 VIEW COMPARISON:  Chest radiograph dated 05/14/2018 and CT dated 07/22/2018 FINDINGS: There is no focal consolidation, pleural effusion, or pneumothorax. Minimal chronic bronchitic changes. The cardiac silhouette is within normal limits. No acute osseous pathology. IMPRESSION: No active cardiopulmonary disease. Electronically Signed   By: Anner Crete M.D.   On: 08/05/2018 03:12    Assessment and Plan:   Nicholas Reyes is a 63 y.o. y/o male here to follow up   for abdominal pain. Recent ER visit and CT abdomen showed features of possible enteritis. MRE showed resolution. Pain resolved on PPI, returned when he went off it but still persists since resumption.    Plan  1.. EGD+ colonoscopy  I have discussed alternative options, risks & benefits,  which include, but are not limited to, bleeding, infection, perforation,respiratory complication & drug reaction.  The patient agrees with this plan & written consent will be obtained.      I discussed the assessment and treatment plan with the patient. The patient was provided an opportunity to ask questions and all were answered. The patient agreed with the plan and demonstrated an understanding of the instructions.   The patient was advised to call back or seek an in-person evaluation if the symptoms worsen or if the condition fails to improve as anticipated.  I provided 11 minutes of non-face-to-face time during this encounter.  Dr Nicholas Bellows MD,MRCP [facility]) Gastroenterology/Hepatology Pager: [phone]   Speech recognition software was used to dictate this note.

## 2018-09-03 NOTE — Telephone Encounter (Signed)
Copied from [place] [phone]. Topic: General - Other >> Sep 03, 2018 11:17 AM Nicholas Reyes wrote: Reason for CRM: Pt stated he had a missed call from the office. Pt requests call back.   Called pt and scheduled a appt for 02/05/2019 per provider with pt. He understood and scheduled.  [place],cma

## 2018-09-10 ENCOUNTER — Ambulatory Visit: Payer: Medicare HMO | Admitting: Gastroenterology

## 2018-09-19 ENCOUNTER — Encounter: Payer: Self-pay | Admitting: Emergency Medicine

## 2018-09-19 ENCOUNTER — Emergency Department: Payer: Medicare HMO

## 2018-09-19 ENCOUNTER — Emergency Department
Admission: EM | Admit: 2018-09-19 | Discharge: 2018-09-19 | Disposition: A | Discharge status: Home / Self Care | Payer: Medicare HMO | Attending: Emergency Medicine | Admitting: Emergency Medicine

## 2018-09-19 ENCOUNTER — Other Ambulatory Visit: Payer: Self-pay

## 2018-09-19 DIAGNOSIS — I861 Scrotal varices: Secondary | ICD-10-CM | POA: Insufficient documentation

## 2018-09-19 DIAGNOSIS — I2510 Atherosclerotic heart disease of native coronary artery without angina pectoris: Secondary | ICD-10-CM | POA: Insufficient documentation

## 2018-09-19 DIAGNOSIS — Z7984 Long term (current) use of oral hypoglycemic drugs: Secondary | ICD-10-CM | POA: Insufficient documentation

## 2018-09-19 DIAGNOSIS — Z79899 Other long term (current) drug therapy: Secondary | ICD-10-CM | POA: Insufficient documentation

## 2018-09-19 DIAGNOSIS — Z87891 Personal history of nicotine dependence: Secondary | ICD-10-CM | POA: Insufficient documentation

## 2018-09-19 DIAGNOSIS — Z7982 Long term (current) use of aspirin: Secondary | ICD-10-CM | POA: Insufficient documentation

## 2018-09-19 DIAGNOSIS — J449 Chronic obstructive pulmonary disease, unspecified: Secondary | ICD-10-CM | POA: Diagnosis not present

## 2018-09-19 DIAGNOSIS — E119 Type 2 diabetes mellitus without complications: Secondary | ICD-10-CM | POA: Diagnosis not present

## 2018-09-19 DIAGNOSIS — N5082 Scrotal pain: Secondary | ICD-10-CM

## 2018-09-19 DIAGNOSIS — I10 Essential (primary) hypertension: Secondary | ICD-10-CM | POA: Diagnosis not present

## 2018-09-19 DIAGNOSIS — R21 Rash and other nonspecific skin eruption: Secondary | ICD-10-CM | POA: Diagnosis not present

## 2018-09-19 DIAGNOSIS — I252 Old myocardial infarction: Secondary | ICD-10-CM | POA: Insufficient documentation

## 2018-09-19 DIAGNOSIS — N50811 Right testicular pain: Secondary | ICD-10-CM | POA: Diagnosis not present

## 2018-09-19 MED ORDER — SULFAMETHOXAZOLE-TRIMETHOPRIM 800-160 MG PO TABS: 1.0000 | ORAL_TABLET | Freq: Two times a day (BID) | ORAL | 0 refills | Status: DC

## 2018-09-19 MED ORDER — ACETAMINOPHEN 325 MG PO TABS
650.0000 mg | ORAL_TABLET | Freq: Once | ORAL | Status: AC
  Administered 2018-09-19: 22:00:00 650 mg via ORAL
  Filled 2018-09-19: qty 2

## 2018-09-19 MED ORDER — ACETAMINOPHEN 500 MG PO TABS: 500.0000 mg | ORAL_TABLET | Freq: Four times a day (QID) | ORAL | 0 refills | Status: AC | PRN

## 2018-09-19 MED ORDER — CLOTRIMAZOLE 1 % EX CREA: 1.0000 "application " | TOPICAL_CREAM | Freq: Two times a day (BID) | CUTANEOUS | 0 refills | Status: DC

## 2018-09-19 NOTE — Discharge Instructions (Addendum)
I have given you clotrimazole cream to cover you for a fungal rash of your testicles and Bactrim to cover you for a bacterial infection.  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  I have given you a handout for information on varicoceles.  Please follow-up with urology to have this further evaluated.  Please also make an appointment with your primary care provider.

## 2018-09-19 NOTE — ED Triage Notes (Signed)
Patient ambulatory to triage with steady gait, without difficulty or distress noted; pt reports "I feel like something is biting me down below"; reports irritation to groins few days

## 2018-09-19 NOTE — ED Notes (Signed)
Unable to access esign. Pt given written DC instructions, reviewed meds/rx/followup. Pt verbalized understanding.

## 2018-09-19 NOTE — ED Provider Notes (Signed)
[facility] Emergency Department Provider Note  ____________________________________________  Time seen: Approximately 8:19 PM  I have reviewed the triage vital signs and the nursing notes.   HISTORY  Chief Complaint Rash    HPI Nicholas Reyes is a 63 y.o. male that presents to the emergency department for evaluation of groin irritation for 3 days.  Patient states that it feels like something is biting his groin.  Area is more pruritic than painful.  Sensation jumps between the 2 testicles.  No new sexual contacts.  No history of herpes.  He is unable to see his groin to see if there is a rash.  He is unsure of fever.  He has not having any difficulty urinating.  Past Medical History:  Diagnosis Date  . Arthritis   . CAD (coronary artery disease)   . COPD (chronic obstructive pulmonary disease) ([place])   . Depression   . Diabetes mellitus   . Heart disease    cad with 3 stents   . Hypertension   . MI (myocardial infarction) ([place])   . Migraines   . Psoriasis    joint pain in knees left hand     Patient Active Problem List   Diagnosis Date Noted  . Enteritis 04/10/2018  . 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 Chronic L5-S1 lumbar pars defect (Left) 03/27/2018  . Grade 1 Anterolisthesis of L5 over S1 03/27/2018  . Chronic knee pain (Right) 03/27/2018  . Chronic knee pain (Left) 03/27/2018  . Chronic patellofemoral knee pain (Left) 03/27/2018  . History of nephrolithiasis 03/27/2018  . Right flank pain 02/13/2018  . Osteoarthritis of knee (Bilateral) (R>L) 01/28/2018  . Tricompartment osteoarthritis of knee (Right) 01/28/2018  . Osteoarthritis of hands (Bilateral) 01/28/2018  . Chronic anticoagulation (PLAVIX) 01/28/2018  . Chronic knee pain (Primary Area of Pain) (Bilateral) (R>L) 01/02/2018  . Chronic hand pain (Secondary Area of Pain) (Bilateral) (L>R) 01/02/2018  . Chronic pain syndrome 01/02/2018  . Long term current use of opiate analgesic  01/02/2018  . Pharmacologic therapy 01/02/2018  . Disorder of skeletal system 01/02/2018  . Problems influencing health status 01/02/2018  . Cognitive impairment 12/11/2017  . DM2 (diabetes mellitus, type 2) ([place]) 12/04/2017  . Psoriasis 11/15/2017  . Skin sore 11/15/2017  . Diabetes mellitus without complication ([place]) 61/60/7371  . Vitamin D deficiency 11/15/2017  . Essential hypertension 06/11/2017  . Coronary artery disease involving native coronary artery of native heart without angina pectoris 06/11/2017  . Exercise-induced angina ([place]) 01/31/2016  . Hypertension 05/18/2014  . Edema 02/09/2014  . Mixed hyperlipidemia 04/02/2013  . HTN (hypertension) 04/02/2013  . Chest pain with high risk for cardiac etiology 12/12/2012  . Dyspnea 12/12/2012  . Infective otitis externa 09/25/2012  . Coronary atherosclerosis 09/02/2012  . Otalgia 09/02/2012  . Benign essential hypertension 08/02/2012  . Chronic airway obstruction, not elsewhere classified 08/02/2012  . Depressive disorder, not elsewhere classified 08/02/2012  . Family history of ischemic heart disease (IHD) 07/08/2012  . Family history of ischemic heart disease and other diseases of the circulatory system 07/08/2012  . Allergic rhinitis 07/05/2012  . Other psoriasis 07/05/2012  . Benign neoplasm of colon 09/22/2010  . Arthritis 06/09/2010  . Asthma 06/09/2010  . Diarrhea 06/09/2010  . Headaches due to old head injury 06/09/2010  . Type 2 diabetes mellitus without complications ([place]) 11/01/9483  . Abdominal pain 06/09/2010    Past Surgical History:  Procedure Laterality Date  . APPENDECTOMY    . CATARACT EXTRACTION Bilateral  inserted lens b/l eyes right 03/12/17, left 05/28/17   . CHOLECYSTECTOMY    . CORONARY ANGIOPLASTY WITH STENT PLACEMENT     LAD, LCX-OM, ? 3rd location  . EYE SURGERY     cataract b/l   . HEMORRHOID SURGERY    . hemorrhoid surgery      x 2   . KNEE SURGERY     right knee arthroscopy,  partial synovectecomy 04/21/15 and meniscectomy/chondroplasty   . LEFT HEART CATH AND CORONARY ANGIOGRAPHY Left 01/30/2018   Procedure: LEFT HEART CATH AND CORONARY ANGIOGRAPHY;  Surgeon: Isaias Cowman, MD;  Location: [facility];  Service: Cardiovascular;  Laterality: Left;  . right knee surgery     ? type of procedure   . STENT PLACEMENT VASCULAR ([place] HX)    . TONSILLECTOMY    . TONSILLECTOMY AND ADENOIDECTOMY      Prior to Admission medications   Medication Sig Start Date End Date Taking? Authorizing Provider  albuterol (PROVENTIL HFA;VENTOLIN HFA) 108 (90 Base) MCG/ACT inhaler Inhale 2 puffs into the lungs every 4 (four) hours as needed. For shortness of breath 05/14/18   McLean-Scocuzza, Nino Glow, MD  amLODipine (NORVASC) 5 MG tablet Take 5 mg by mouth daily.    [provider]  aspirin 81 MG EC tablet Take 81 mg by mouth daily.     [provider]  atorvastatin (LIPITOR) 20 MG tablet Take 20 mg by mouth daily.    [provider]  budesonide-formoterol (SYMBICORT) 160-4.5 MCG/ACT inhaler Inhale 2 puffs into the lungs 2 (two) times daily. Rinse mouth 05/14/18   McLean-Scocuzza, Nino Glow, MD  cetirizine (ZYRTEC) 10 MG tablet Take 10 mg by mouth daily.    [provider]  Cholecalciferol 50000 units capsule Take 1 capsule (50,000 Units total) by mouth once a week. 12/10/17   McLean-Scocuzza, Nino Glow, MD  clobetasol ointment (TEMOVATE) 7.82 % Apply 1 application topically 2 (two) times daily. Right ear as needed not face or private 04/10/18   McLean-Scocuzza, Nino Glow, MD  diclofenac (FLECTOR) 1.3 % PTCH Place 1 patch onto the skin 2 (two) times daily. 11/30/17   McLean-Scocuzza, Nino Glow, MD  dicyclomine (BENTYL) 10 MG capsule Take 1 capsule (10 mg total) by mouth 4 (four) times daily -  before meals and at bedtime. 06/26/18   Jonathon Bellows, MD  DULoxetine (CYMBALTA) 60 MG capsule Take 1 capsule (60 mg total) by mouth daily. 01/11/18   McLean-Scocuzza, Nino Glow, MD  fluticasone (FLONASE) 50 MCG/ACT nasal spray Place 2 sprays into both nostrils daily. 05/14/18   McLean-Scocuzza, Nino Glow, MD  furosemide (LASIX) 40 MG tablet Take 1 tablet (40 mg total) by mouth 2 (two) times daily. In am and at lunch 07/10/18   McLean-Scocuzza, Nino Glow, MD  hydrocortisone 2.5 % cream Apply topically 2 (two) times daily. Left face left of eye 07/10/18   McLean-Scocuzza, Nino Glow, MD  hydrOXYzine (ATARAX/VISTARIL) 25 MG tablet Take 1 tablet (25 mg total) by mouth 3 (three) times daily as needed. 05/14/18   McLean-Scocuzza, Nino Glow, MD  isosorbide mononitrate (IMDUR) 30 MG 24 hr tablet Take 1 tablet (30 mg total) by mouth daily. 01/11/18   McLean-Scocuzza, Nino Glow, MD  ketorolac (ACULAR) 0.5 % ophthalmic solution  12/03/17   [provider]  meloxicam (MOBIC) 7.5 MG tablet Take 1 tablet (7.5 mg total) by mouth 2 (two) times daily as needed for pain. 07/07/18   McLean-Scocuzza, Nino Glow, MD  metFORMIN (GLUCOPHAGE) 500 MG  tablet Take 1 tablet (500 mg total) by mouth daily with breakfast. 11/23/17   McLean-Scocuzza, Nino Glow, MD  metoprolol tartrate (LOPRESSOR) 25 MG tablet Take 25 mg by mouth 2 (two) times daily.    [provider]  montelukast (SINGULAIR) 10 MG tablet Take 10 mg by mouth at bedtime.    [provider]  mupirocin ointment (BACTROBAN) 2 % Apply 1 application topically 2 (two) times daily. Any open sores 07/10/18   McLean-Scocuzza, Nino Glow, MD  nitroGLYCERIN (NITROSTAT) 0.4 MG SL tablet Place 0.4 mg under the tongue every 5 (five) minutes as needed. For chest pain    [provider]  omeprazole (PRILOSEC) 40 MG capsule Take 1 capsule (40 mg total) by mouth daily. 06/26/18 08/27/18  Jonathon Bellows, MD  ondansetron (ZOFRAN) 4 MG tablet Take 1 tablet (4 mg total) by mouth every 8 (eight) hours as needed for nausea or vomiting. 12/19/17   Jodelle Green, FNP  pantoprazole (PROTONIX) 20 MG tablet TAKE 1 TABLET EVERY DAY 04/29/18   Burnard Hawthorne, FNP   potassium chloride SA (K-DUR,KLOR-CON) 20 MEQ tablet Take 1 tablet (20 mEq total) by mouth daily. 07/10/18   McLean-Scocuzza, Nino Glow, MD  prednisoLONE acetate (PRED FORTE) 1 % ophthalmic suspension  11/30/17   [provider]  Secukinumab (COSENTYX 300 DOSE [place]) Inject into the skin every 30 (thirty) days.    [provider]  spironolactone (ALDACTONE) 25 MG tablet Take 1 tablet (25 mg total) by mouth daily. In am for blood pressure 05/03/18   McLean-Scocuzza, Nino Glow, MD  sucralfate (CARAFATE) 1 g tablet Take 1 tablet (1 g total) by mouth 4 (four) times daily -  with meals and at bedtime. 02/24/18 02/24/19  Merlyn Lot, MD  triamcinolone cream (KENALOG) 0.1 % Apply 1 application topically 2 (two) times daily. Not face, groin, underarms 11/15/17   McLean-Scocuzza, Nino Glow, MD    Allergies Patient has no known allergies.  Family History  Problem Relation Age of Onset  . Cancer Mother   . Heart disease Father   . Stroke Father   . Heart disease Brother     Social History Social History   Tobacco Use  . Smoking status: Former Research scientist (life sciences)  . Smokeless tobacco: Never Used  Substance Use Topics  . Alcohol use: No    Frequency: Never  . Drug use: No     Review of Systems  Constitutional: No chills Cardiovascular: No chest pain. Respiratory:  No SOB. Gastrointestinal: No nausea, no vomiting.  Musculoskeletal: Negative for musculoskeletal pain. Skin: Negative for rash, abrasions, lacerations, ecchymosis. Neurological: Negative for headaches   ____________________________________________   PHYSICAL EXAM:  VITAL SIGNS: ED Triage Vitals  Enc Vitals Group     BP 09/19/18 1922 132/69     Pulse Rate 09/19/18 1922 92     Resp 09/19/18 1922 19     Temp 09/19/18 1922 98 F (36.7 C)     Temp Source 09/19/18 1922 Oral     SpO2 09/19/18 1922 94 %     Weight 09/19/18 1921 235 lb (106.6 kg)     Height 09/19/18 1921 5\' 7"  (1.702 m)     Head Circumference --       Peak Flow --      Pain Score 09/19/18 1921 8     Pain Loc --      Pain Edu? --      Excl. in [place]? --      Constitutional: Alert and  oriented. Well appearing and in no acute distress. Eyes: Conjunctivae are normal. PERRL. EOMI. Head: Atraumatic. ENT:      Ears:      Nose: No congestion/rhinnorhea.      Mouth/Throat: Mucous membranes are moist.  Neck: No stridor.  Cardiovascular: Normal rate, regular rhythm.  Good peripheral circulation. Respiratory: Normal respiratory effort without tachypnea or retractions. Lungs CTAB. Good air entry to the bases with no decreased or absent breath sounds. Genitourinary: Apolonio Schneiders RN present for genital exam.  No rash mid thighs.  Small vesicles/raw skin to right testicle where patient elicits pain.  No pubic lice. Musculoskeletal: Full range of motion to all extremities. No gross deformities appreciated. Neurologic:  Normal speech and language. No gross focal neurologic deficits are appreciated.  Skin:  Skin is warm, dry and intact.  Psychiatric: Mood and affect are normal. Speech and behavior are normal. Patient exhibits appropriate insight and judgement.   ____________________________________________   LABS (all labs ordered are listed, but only abnormal results are displayed)  Labs Reviewed - No data to display ____________________________________________  EKG   ____________________________________________  RADIOLOGY   No results found.  ____________________________________________    PROCEDURES  Procedure(s) performed:    Procedures    Medications - No data to display   ____________________________________________   INITIAL IMPRESSION / ASSESSMENT AND PLAN / ED COURSE  Pertinent labs & imaging results that were available during my care of the patient were reviewed by me and considered in my medical decision making (see chart for details).  Review of the [place] CSRS was performed in accordance of the [place] prior to  dispensing any controlled drugs.   Patient presented to the emergency department for evaluation of testicular irritation for 3 days.  Patient states that area is more itchy than painful but has asked for pain medication multiple times.  He was given Tylenol for pain.  Ultrasound consistent with right-sided varicocele.  He has some raw skin and lesions to his right testicle without an obvious infection.  He denies any history of herpes or new sexual contacts.  He will be covered for a fungal and bacterial infection with clotrimazole and Bactrim.  Patient's O2 varied between 91 and 94% on room air.  His baseline from previous visits is between 91 and 92%.  He denies any respiratory symptoms.  Patient will be discharged home with prescriptions for clotrimazole, Bactrim, Tylenol. Patient is to follow up with primary care and urology as directed.  Referral was given.  Patient is given ED precautions to return to the ED for any worsening or new symptoms.     ____________________________________________  FINAL CLINICAL IMPRESSION(S) / ED DIAGNOSES  Final diagnoses:  Scrotum pain      NEW MEDICATIONS STARTED DURING THIS VISIT:  ED Discharge Orders    None          This chart was dictated using voice recognition software/Dragon. Despite best efforts to proofread, errors can occur which can change the meaning. Any change was purely unintentional.    Laban Emperor, PA-C 09/19/18 2319    Earleen Newport, MD 09/23/18 [phone]

## 2018-09-20 ENCOUNTER — Ambulatory Visit (INDEPENDENT_AMBULATORY_CARE_PROVIDER_SITE_OTHER): Payer: Medicare HMO | Admitting: Internal Medicine

## 2018-09-20 ENCOUNTER — Telehealth: Payer: Self-pay | Admitting: Internal Medicine

## 2018-09-20 DIAGNOSIS — B356 Tinea cruris: Secondary | ICD-10-CM

## 2018-09-20 DIAGNOSIS — L304 Erythema intertrigo: Secondary | ICD-10-CM

## 2018-09-20 DIAGNOSIS — N50819 Testicular pain, unspecified: Secondary | ICD-10-CM | POA: Diagnosis not present

## 2018-09-20 MED ORDER — OXYCODONE-ACETAMINOPHEN 5-325 MG PO TABS: 1.0000 | ORAL_TABLET | Freq: Two times a day (BID) | ORAL | 0 refills | Status: AC | PRN

## 2018-09-20 MED ORDER — FLUCONAZOLE 150 MG PO TABS: 150.0000 mg | ORAL_TABLET | Freq: Once | ORAL | 0 refills | Status: AC

## 2018-09-20 MED ORDER — CLOTRIMAZOLE 1 % EX CREA: 1.0000 "application " | TOPICAL_CREAM | Freq: Two times a day (BID) | CUTANEOUS | 11 refills | Status: AC

## 2018-09-20 NOTE — Telephone Encounter (Signed)
Medication has gone through

## 2018-09-20 NOTE — Patient Instructions (Addendum)
Use [place] powder WITHOUT TALC to keep you dry in the private area   Urology/private area Doctor [facility] [address] [place], [place] [address] [phone]   Jock Itch  Jock itch is an itchy rash in the groin and upper thigh area. It is a skin infection that is caused by a type of germ that lives in dark, damp places (fungus). The rash usually goes away in 2-3 weeks with treatment. Follow these instructions at home: Skin care  Use skin creams, ointments, or powders exactly as told by your doctor.  Wear loose-fitting clothes. Clothes should not rub against your groin area. Men should wear boxer shorts or loose-fitting underwear.  Keep your groin area clean and dry. ? Change your underwear every day. ? Change out of wet bathing suits as soon as you can. ? After bathing, use a separate towel to dry your groin area. Dry the area gently and completely.  Avoid hot baths and showers. Hot water can make itching worse.  Do not scratch the area. General instructions  Take and apply over-the-counter and prescription medicines only as told by your doctor.  Do not share towels or clothing with other people.  Wash your hands often with soap and water, especially after touching your groin area. If you do not have soap and water, use alcohol-based hand sanitizer. Contact a doctor if:  Your rash: ? Gets worse. ? Does not get better after 2 weeks of treatment. ? Spreads. ? Comes back after treatment is done.  You have any of the following: ? A fever. ? New or worsening redness, swelling, or pain around your rash. ? Fluid, blood, or pus coming from your rash. Summary  Jock itch is an itchy rash. It affects the groin and upper thigh area.  Jock itch usually goes away in 2-3 weeks with treatment.  Keep your groin area clean and dry. This information is not intended to replace advice given to you by your health care  provider. Make sure you discuss any questions you have with your health care provider. Document Released: 07/19/2009 Document Revised: 04/04/2017 Document Reviewed: 04/04/2017 Elsevier Interactive Patient Education  2019 [place] is skin irritation or inflammation (dermatitis) that occurs when folds of skin rub together. The irritation can cause a rash and make skin raw and itchy. This condition most commonly occurs in the skin folds of these areas:  Toes.  Armpits.  Groin.  Under the belly.  Under the breasts.  Buttocks. Intertrigo is not passed from person to person (is not contagious). What are the causes? This condition is caused by heat, moisture, rubbing (friction), and not enough air circulation. The condition can be made worse by:  Sweat.  Bacteria.  A fungus, such as yeast. What increases the risk? This condition is more likely to occur if you have moisture in your skin folds. You are more likely to develop this condition if you:  Have diabetes.  Are overweight.  Are not able to move around or are not active.  Live in a warm and moist climate.  Wear splints, braces, or other medical devices.  Are not able to control your bowels or bladder (have incontinence). What are the signs or symptoms? Symptoms of this condition include:  A pink or red skin rash in the skin fold or near the skin fold.  Raw or scaly skin.  Itchiness.  A burning feeling.  Bleeding.  Leaking fluid.  A bad smell. How is this diagnosed? This condition is diagnosed with a medical history and physical exam. You may also have a skin swab to test for bacteria or a fungus. How is this treated? This condition may be treated by:  Cleaning and drying your skin.  Taking an antibiotic medicine or using an antibiotic skin cream for a bacterial infection.  Using an antifungal cream on your skin or taking pills for an infection that was caused by a fungus,  such as yeast.  Using a steroid ointment to relieve itchiness and irritation.  Separating the skin fold with a clean cotton cloth to absorb moisture and allow air to flow into the area. Follow these instructions at home:  Keep the affected area clean and dry.  Do not scratch your skin.  Stay in a cool environment as much as possible. Use an air conditioner or fan, if available.  Apply over-the-counter and prescription medicines only as told by your health care provider.  If you were prescribed an antibiotic medicine, use it as told by your health care provider. Do not stop using the antibiotic even if your condition improves.  Keep all follow-up visits as told by your health care provider. This is important. How is this prevented?   Maintain a healthy weight.  Take care of your feet, especially if you have diabetes. Foot care includes: ? Wearing shoes that fit well. ? Keeping your feet dry. ? Wearing clean, breathable socks.  Protect the skin around your groin and buttocks, especially if you have incontinence. Skin protection includes: ? Following a regular cleaning routine. ? Using skin protectant creams, powders, or ointments. ? Changing protection pads frequently.  Do not wear tight clothes. Wear clothes that are loose, absorbent, and made of cotton.  Wear a bra that gives good support, if needed.  Shower and dry yourself well after activity or exercise. Use a hair dryer on a cool setting to dry between skin folds, especially after you bathe.  If you have diabetes, keep your blood sugar under control. Contact a health care provider if:  Your symptoms do not improve with treatment.  Your symptoms get worse or they spread.  You notice increased redness and warmth.  You have a fever. Summary  Intertrigo is skin irritation or inflammation (dermatitis) that occurs when folds of skin rub together.  This condition is caused by heat, moisture, rubbing (friction), and  not enough air circulation.  This condition may be treated by cleaning and drying your skin and with medicines.  Apply over-the-counter and prescription medicines only as told by your health care provider.  Keep all follow-up visits as told by your health care provider. This is important. This information is not intended to replace advice given to you by your health care provider. Make sure you discuss any questions you have with your health care provider. Document Released: 04/24/2005 Document Revised: 09/24/2017 Document Reviewed: 09/24/2017 Elsevier Interactive Patient Education  2019 [employer].

## 2018-09-20 NOTE — Telephone Encounter (Signed)
Call [facility] and make sure percocet 2x per day 5-325 #10 tablets went through   Thanks [place]

## 2018-09-20 NOTE — Progress Notes (Signed)
Telephone Note  I connected with Nicholas Reyes  on 09/20/18 at 11:25 AM EDT by a telephone  and verified that I am speaking with the correct person using two identifiers.  Location patient: home Location provider:work  Persons participating in the virtual visit: patient, provider  I discussed the limitations of evaluation and management by telemedicine and the availability of in person appointments. The patient expressed understanding and agreed to proceed.   HPI: ED f/u 09/19/2018 for testicular pain [place] with right varicocele rec CT ab/pelvis to pt just had CT ab/pelvis 04/09/18 (see below). He c/o 8-9/10 testicular pain   Reproductive: Central prostatic calcifications.  Other: No abdominal wall hernia or abnormality. No abdominopelvic ascites.  Musculoskeletal: No fracture is seen.  IMPRESSION: 1. Mildly dilated loop of jejunum with wall thickening possibly representing enteritis, dysmotility, or partial obstruction. 2. Coronary artery and aortic calcific atherosclerosis.  -established with urology Dr. Diamantina Providence will refer back to f/u testicular pain   2. C/o itching in groin and testicular region given bactrim and clotrimazole in the ED to help which he only took 1 dose of bactrim today. He does report sweating in this area     ROS: See pertinent positives and negatives per HPI.  Past Medical History:  Diagnosis Date  . Arthritis   . CAD (coronary artery disease)   . COPD (chronic obstructive pulmonary disease) ([place])   . Depression   . Diabetes mellitus   . Heart disease    cad with 3 stents   . Hypertension   . MI (myocardial infarction) ([place])   . Migraines   . Psoriasis    joint pain in knees left hand     Past Surgical History:  Procedure Laterality Date  . APPENDECTOMY    . CATARACT EXTRACTION Bilateral    inserted lens b/l eyes right 03/12/17, left 05/28/17   . CHOLECYSTECTOMY    . CORONARY ANGIOPLASTY WITH STENT PLACEMENT     LAD, LCX-OM, ? 3rd location  .  EYE SURGERY     cataract b/l   . HEMORRHOID SURGERY    . hemorrhoid surgery      x 2   . KNEE SURGERY     right knee arthroscopy, partial synovectecomy 04/21/15 and meniscectomy/chondroplasty   . LEFT HEART CATH AND CORONARY ANGIOGRAPHY Left 01/30/2018   Procedure: LEFT HEART CATH AND CORONARY ANGIOGRAPHY;  Surgeon: Isaias Cowman, MD;  Location: [facility];  Service: Cardiovascular;  Laterality: Left;  . right knee surgery     ? type of procedure   . STENT PLACEMENT VASCULAR ([place] HX)    . TONSILLECTOMY    . TONSILLECTOMY AND ADENOIDECTOMY      Family History  Problem Relation Age of Onset  . Cancer Mother   . Heart disease Father   . Stroke Father   . Heart disease Brother     SOCIAL HX: lives alone    Current Outpatient Medications:  .  acetaminophen (TYLENOL) 500 MG tablet, Take 1 tablet (500 mg total) by mouth every 6 (six) hours as needed., Disp: 30 tablet, Rfl: 0 .  albuterol (PROVENTIL HFA;VENTOLIN HFA) 108 (90 Base) MCG/ACT inhaler, Inhale 2 puffs into the lungs every 4 (four) hours as needed. For shortness of breath, Disp: 3 Inhaler, Rfl: 4 .  amLODipine (NORVASC) 5 MG tablet, Take 5 mg by mouth daily., Disp: , Rfl:  .  aspirin 81 MG EC tablet, Take 81 mg by mouth daily. , Disp: , Rfl:  .  atorvastatin (LIPITOR) 20 MG tablet, Take 20 mg by mouth daily., Disp: , Rfl:  .  budesonide-formoterol (SYMBICORT) 160-4.5 MCG/ACT inhaler, Inhale 2 puffs into the lungs 2 (two) times daily. Rinse mouth, Disp: 1 Inhaler, Rfl: 12 .  cetirizine (ZYRTEC) 10 MG tablet, Take 10 mg by mouth daily., Disp: , Rfl:  .  Cholecalciferol 50000 units capsule, Take 1 capsule (50,000 Units total) by mouth once a week., Disp: 13 capsule, Rfl: 1 .  clobetasol ointment (TEMOVATE) 8.46 %, Apply 1 application topically 2 (two) times daily. Right ear as needed not face or private, Disp: 45 g, Rfl: 0 .  clotrimazole (LOTRIMIN) 1 % cream, Apply 1 application topically 2 (two) times daily.,  Disp: 60 g, Rfl: 11 .  diclofenac (FLECTOR) 1.3 % PTCH, Place 1 patch onto the skin 2 (two) times daily., Disp: 60 patch, Rfl: 2 .  dicyclomine (BENTYL) 10 MG capsule, Take 1 capsule (10 mg total) by mouth 4 (four) times daily -  before meals and at bedtime., Disp: 90 capsule, Rfl: 0 .  DULoxetine (CYMBALTA) 60 MG capsule, Take 1 capsule (60 mg total) by mouth daily., Disp: 90 capsule, Rfl: 3 .  fluticasone (FLONASE) 50 MCG/ACT nasal spray, Place 2 sprays into both nostrils daily., Disp: 16 g, Rfl: 6 .  furosemide (LASIX) 40 MG tablet, Take 1 tablet (40 mg total) by mouth 2 (two) times daily. In am and at lunch, Disp: 60 tablet, Rfl: 5 .  hydrocortisone 2.5 % cream, Apply topically 2 (two) times daily. Left face left of eye, Disp: 60 g, Rfl: 0 .  hydrOXYzine (ATARAX/VISTARIL) 25 MG tablet, Take 1 tablet (25 mg total) by mouth 3 (three) times daily as needed., Disp: 30 tablet, Rfl: 0 .  isosorbide mononitrate (IMDUR) 30 MG 24 hr tablet, Take 1 tablet (30 mg total) by mouth daily., Disp: 90 tablet, Rfl: 3 .  ketorolac (ACULAR) 0.5 % ophthalmic solution, , Disp: , Rfl:  .  meloxicam (MOBIC) 7.5 MG tablet, Take 1 tablet (7.5 mg total) by mouth 2 (two) times daily as needed for pain., Disp: 60 tablet, Rfl: 2 .  metFORMIN (GLUCOPHAGE) 500 MG tablet, Take 1 tablet (500 mg total) by mouth daily with breakfast., Disp: 90 tablet, Rfl: 3 .  metoprolol tartrate (LOPRESSOR) 25 MG tablet, Take 25 mg by mouth 2 (two) times daily., Disp: , Rfl:  .  montelukast (SINGULAIR) 10 MG tablet, Take 10 mg by mouth at bedtime., Disp: , Rfl:  .  mupirocin ointment (BACTROBAN) 2 %, Apply 1 application topically 2 (two) times daily. Any open sores, Disp: 60 g, Rfl: 2 .  nitroGLYCERIN (NITROSTAT) 0.4 MG SL tablet, Place 0.4 mg under the tongue every 5 (five) minutes as needed. For chest pain, Disp: , Rfl:  .  ondansetron (ZOFRAN) 4 MG tablet, Take 1 tablet (4 mg total) by mouth every 8 (eight) hours as needed for nausea or  vomiting., Disp: 20 tablet, Rfl: 0 .  pantoprazole (PROTONIX) 20 MG tablet, TAKE 1 TABLET EVERY DAY, Disp: 90 tablet, Rfl: 0 .  potassium chloride SA (K-DUR,KLOR-CON) 20 MEQ tablet, Take 1 tablet (20 mEq total) by mouth daily., Disp: 90 tablet, Rfl: 1 .  prednisoLONE acetate (PRED FORTE) 1 % ophthalmic suspension, , Disp: , Rfl:  .  Secukinumab (COSENTYX 300 DOSE [place]), Inject into the skin every 30 (thirty) days., Disp: , Rfl:  .  spironolactone (ALDACTONE) 25 MG tablet, Take 1 tablet (25 mg total) by mouth daily. In am for blood  pressure, Disp: 90 tablet, Rfl: 3 .  sucralfate (CARAFATE) 1 g tablet, Take 1 tablet (1 g total) by mouth 4 (four) times daily -  with meals and at bedtime., Disp: 120 tablet, Rfl: 0 .  sulfamethoxazole-trimethoprim (BACTRIM DS) 800-160 MG tablet, Take 1 tablet by mouth 2 (two) times daily., Disp: 20 tablet, Rfl: 0 .  triamcinolone cream (KENALOG) 0.1 %, Apply 1 application topically 2 (two) times daily. Not face, groin, underarms, Disp: 454 g, Rfl: 11 .  fluconazole (DIFLUCAN) 150 MG tablet, Take 1 tablet (150 mg total) by mouth once for 1 dose., Disp: 1 tablet, Rfl: 0 .  omeprazole (PRILOSEC) 40 MG capsule, Take 1 capsule (40 mg total) by mouth daily., Disp: 90 capsule, Rfl: 3 .  oxyCODONE-acetaminophen (PERCOCET) 5-325 MG tablet, Take 1 tablet by mouth 2 (two) times daily as needed for severe pain., Disp: 10 tablet, Rfl: 0  EXAM:  VITALS per patient if applicable:  GENERAL: alert, oriented, appears well and in no acute distress  PSYCH/NEURO: pleasant and cooperative, no obvious depression or anxiety, speech and thought processing grossly intact  GU: likely intertrigo/jock itch per report via phone with testicular pain see [place] 09/19/2018  Right epididymis:  Normal in size and appearance.  Left epididymis:  A small epididymal head cyst is noted.  Hydrocele:  A right-sided hydrocele is noted that is small in size.  Varicocele:  There is a right-sided  varicocele.  Pulsed Doppler interrogation of both testes demonstrates normal low resistance arterial and venous waveforms bilaterally.  IMPRESSION: 1. No acute sonographic abnormality detected. There is no sonographic evidence for testicular torsion or orchitis/epididymitis. 2. Isolated right-sided varicocele. Follow-up with a nonemergent outpatient contrast enhanced CT of the abdomen pelvis is recommended for further evaluation of this finding.  ASSESSMENT AND PLAN:  Discussed the following assessment and plan:  Testicular pain, unspecified - Plan: Ambulatory referral to Urology Dr. Diamantina [place], oxyCODONE-acetaminophen (PERCOCET) 5-325 MG tablet bid # 10 Reviewed [place] 09/19/2018 will let urology decide if CT ab/pelvis warranted just had 04/2018 CT ab/pelvis   Intertrigo - Plan: fluconazole (DIFLUCAN) 150 MG tablet x1, clotrimazole (LOTRIMIN) 1 % cream Jock itch - Plan: fluconazole (DIFLUCAN) 150 MG tablet x1, clotrimazole (LOTRIMIN) 1 % cream rec gold bond otc w/o talc powder to keep him dry      I discussed the assessment and treatment plan with the patient. The patient was provided an opportunity to ask questions and all were answered. The patient agreed with the plan and demonstrated an understanding of the instructions.   The patient was advised to call back or seek an in-person evaluation if the symptoms worsen or if the condition fails to improve as anticipated.  Time spent 15 minutes  Delorise Jackson, MD

## 2018-09-25 ENCOUNTER — Other Ambulatory Visit: Payer: Self-pay | Admitting: *Deleted

## 2018-09-25 NOTE — Patient Outreach (Signed)
[facility]) Care Management  09/25/2018  Nicholas Reyes [birth year] [id number]   [place]  Referral Date:02/25/2018 Referral Source:[facility] ED Census Reason for Referral:6 or more ED visits in the past 6 months Insurance:[employer] & Medicaid   Outreach Attempt:  Outreach attempt #1 to patient for monthly follow up. No answer. RN Health Coach left HIPAA compliant voicemail message along with contact information.  Plan:  RN Health Coach will attempt another telephone outreach to patient within the month of June if no return call back from patient.  Combine [phone] [name]

## 2018-10-03 ENCOUNTER — Telehealth: Payer: Self-pay | Admitting: Internal Medicine

## 2018-10-03 ENCOUNTER — Other Ambulatory Visit: Payer: Self-pay | Admitting: Internal Medicine

## 2018-10-03 DIAGNOSIS — I2510 Atherosclerotic heart disease of native coronary artery without angina pectoris: Secondary | ICD-10-CM

## 2018-10-03 DIAGNOSIS — E785 Hyperlipidemia, unspecified: Secondary | ICD-10-CM

## 2018-10-03 DIAGNOSIS — E119 Type 2 diabetes mellitus without complications: Secondary | ICD-10-CM

## 2018-10-03 MED ORDER — ATORVASTATIN CALCIUM 20 MG PO TABS: 20.0000 mg | ORAL_TABLET | Freq: Every day | ORAL | 3 refills | Status: AC

## 2018-10-03 NOTE — Telephone Encounter (Signed)
Call patient is he agreeable to start a cholesterol statin medication for diabetes and his heart?   [place]

## 2018-10-03 NOTE — Telephone Encounter (Signed)
Patient ok with the medication

## 2018-10-03 NOTE — Telephone Encounter (Signed)
Resent lipitor 20 mg qhs

## 2018-10-03 NOTE — Telephone Encounter (Signed)
Left message for patient to return call back. PEC may give information.  

## 2018-10-08 ENCOUNTER — Encounter: Payer: Self-pay | Admitting: *Deleted

## 2018-10-08 ENCOUNTER — Emergency Department
Admission: EM | Admit: 2018-10-08 | Discharge: 2018-10-08 | Disposition: A | Discharge status: Home / Self Care | Payer: Medicare HMO | Attending: Emergency Medicine | Admitting: Emergency Medicine

## 2018-10-08 ENCOUNTER — Other Ambulatory Visit: Payer: Self-pay

## 2018-10-08 DIAGNOSIS — J449 Chronic obstructive pulmonary disease, unspecified: Secondary | ICD-10-CM | POA: Diagnosis not present

## 2018-10-08 DIAGNOSIS — Z79899 Other long term (current) drug therapy: Secondary | ICD-10-CM | POA: Diagnosis not present

## 2018-10-08 DIAGNOSIS — I2510 Atherosclerotic heart disease of native coronary artery without angina pectoris: Secondary | ICD-10-CM | POA: Insufficient documentation

## 2018-10-08 DIAGNOSIS — Z7982 Long term (current) use of aspirin: Secondary | ICD-10-CM | POA: Insufficient documentation

## 2018-10-08 DIAGNOSIS — Y929 Unspecified place or not applicable: Secondary | ICD-10-CM | POA: Insufficient documentation

## 2018-10-08 DIAGNOSIS — L089 Local infection of the skin and subcutaneous tissue, unspecified: Secondary | ICD-10-CM | POA: Diagnosis not present

## 2018-10-08 DIAGNOSIS — Y939 Activity, unspecified: Secondary | ICD-10-CM | POA: Insufficient documentation

## 2018-10-08 DIAGNOSIS — Y999 Unspecified external cause status: Secondary | ICD-10-CM | POA: Insufficient documentation

## 2018-10-08 DIAGNOSIS — I10 Essential (primary) hypertension: Secondary | ICD-10-CM | POA: Insufficient documentation

## 2018-10-08 DIAGNOSIS — I252 Old myocardial infarction: Secondary | ICD-10-CM | POA: Diagnosis not present

## 2018-10-08 DIAGNOSIS — E119 Type 2 diabetes mellitus without complications: Secondary | ICD-10-CM | POA: Diagnosis not present

## 2018-10-08 DIAGNOSIS — Z87891 Personal history of nicotine dependence: Secondary | ICD-10-CM | POA: Insufficient documentation

## 2018-10-08 DIAGNOSIS — W57XXXA Bitten or stung by nonvenomous insect and other nonvenomous arthropods, initial encounter: Secondary | ICD-10-CM | POA: Diagnosis not present

## 2018-10-08 DIAGNOSIS — S80862A Insect bite (nonvenomous), left lower leg, initial encounter: Secondary | ICD-10-CM | POA: Diagnosis not present

## 2018-10-08 MED ORDER — SULFAMETHOXAZOLE-TRIMETHOPRIM 800-160 MG PO TABS
1.0000 | ORAL_TABLET | Freq: Once | ORAL | Status: AC
  Administered 2018-10-08: 1 via ORAL
  Filled 2018-10-08: qty 1

## 2018-10-08 MED ORDER — SULFAMETHOXAZOLE-TRIMETHOPRIM 800-160 MG PO TABS: 1.0000 | ORAL_TABLET | Freq: Two times a day (BID) | ORAL | 0 refills | Status: DC

## 2018-10-08 NOTE — ED Provider Notes (Signed)
[facility] Emergency Department Provider Note  ____________________________________________  Time seen: Approximately 10:47 PM  I have reviewed the triage vital signs and the nursing notes.   HISTORY  Chief Complaint Insect Bite    HPI Nicholas Reyes is a 63 y.o. male who presents the emergency department complaining of possible infection to the left foot from an insect bite.  Patient reports that 2 days ago he woke up with it appeared to be an erythematous bug bite to the top of the foot.  Patient scratched the area to open.  There is now increasing erythema, edema and pain to the dorsal aspect of the foot.  No medications prior to arrival.  Patient denies any other injury or complaint.         Past Medical History:  Diagnosis Date  . Arthritis   . CAD (coronary artery disease)   . COPD (chronic obstructive pulmonary disease) ([place])   . Depression   . Diabetes mellitus   . Heart disease    cad with 3 stents   . Hypertension   . MI (myocardial infarction) ([place])   . Migraines   . Psoriasis    joint pain in knees left hand     Patient Active Problem List   Diagnosis Date Noted  . Enteritis 04/10/2018  . 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 Chronic L5-S1 lumbar pars defect (Left) 03/27/2018  . Grade 1 Anterolisthesis of L5 over S1 03/27/2018  . Chronic knee pain (Right) 03/27/2018  . Chronic knee pain (Left) 03/27/2018  . Chronic patellofemoral knee pain (Left) 03/27/2018  . History of nephrolithiasis 03/27/2018  . Right flank pain 02/13/2018  . Osteoarthritis of knee (Bilateral) (R>L) 01/28/2018  . Tricompartment osteoarthritis of knee (Right) 01/28/2018  . Osteoarthritis of hands (Bilateral) 01/28/2018  . Chronic anticoagulation (PLAVIX) 01/28/2018  . Chronic knee pain (Primary Area of Pain) (Bilateral) (R>L) 01/02/2018  . Chronic hand pain (Secondary Area of Pain) (Bilateral) (L>R) 01/02/2018  . Chronic pain syndrome 01/02/2018  . Long term  current use of opiate analgesic 01/02/2018  . Pharmacologic therapy 01/02/2018  . Disorder of skeletal system 01/02/2018  . Problems influencing health status 01/02/2018  . Cognitive impairment 12/11/2017  . DM2 (diabetes mellitus, type 2) ([place]) 12/04/2017  . Psoriasis 11/15/2017  . Skin sore 11/15/2017  . Diabetes mellitus without complication ([place]) 02/72/5366  . Vitamin D deficiency 11/15/2017  . Essential hypertension 06/11/2017  . Coronary artery disease involving native coronary artery of native heart without angina pectoris 06/11/2017  . Exercise-induced angina ([place]) 01/31/2016  . Hypertension 05/18/2014  . Edema 02/09/2014  . Mixed hyperlipidemia 04/02/2013  . HTN (hypertension) 04/02/2013  . Chest pain with high risk for cardiac etiology 12/12/2012  . Dyspnea 12/12/2012  . Infective otitis externa 09/25/2012  . Coronary atherosclerosis 09/02/2012  . Otalgia 09/02/2012  . Benign essential hypertension 08/02/2012  . Chronic airway obstruction, not elsewhere classified 08/02/2012  . Depressive disorder, not elsewhere classified 08/02/2012  . Family history of ischemic heart disease (IHD) 07/08/2012  . Family history of ischemic heart disease and other diseases of the circulatory system 07/08/2012  . Allergic rhinitis 07/05/2012  . Other psoriasis 07/05/2012  . Benign neoplasm of colon 09/22/2010  . Arthritis 06/09/2010  . Asthma 06/09/2010  . Diarrhea 06/09/2010  . Headaches due to old head injury 06/09/2010  . Type 2 diabetes mellitus without complications ([place]) 44/07/4740  . Abdominal pain 06/09/2010    Past Surgical History:  Procedure Laterality Date  . APPENDECTOMY    .  CATARACT EXTRACTION Bilateral    inserted lens b/l eyes right 03/12/17, left 05/28/17   . CHOLECYSTECTOMY    . CORONARY ANGIOPLASTY WITH STENT PLACEMENT     LAD, LCX-OM, ? 3rd location  . EYE SURGERY     cataract b/l   . HEMORRHOID SURGERY    . hemorrhoid surgery      x 2   . KNEE SURGERY      right knee arthroscopy, partial synovectecomy 04/21/15 and meniscectomy/chondroplasty   . LEFT HEART CATH AND CORONARY ANGIOGRAPHY Left 01/30/2018   Procedure: LEFT HEART CATH AND CORONARY ANGIOGRAPHY;  Surgeon: Isaias Cowman, MD;  Location: [place] [facility];  Service: Cardiovascular;  Laterality: Left;  . right knee surgery     ? type of procedure   . STENT PLACEMENT VASCULAR ([place] HX)    . TONSILLECTOMY    . TONSILLECTOMY AND ADENOIDECTOMY      Prior to Admission medications   Medication Sig Start Date End Date Taking? Authorizing Provider  acetaminophen (TYLENOL) 500 MG tablet Take 1 tablet (500 mg total) by mouth every 6 (six) hours as needed. 09/19/18   Laban Emperor, PA-C  albuterol (PROVENTIL HFA;VENTOLIN HFA) 108 (90 Base) MCG/ACT inhaler Inhale 2 puffs into the lungs every 4 (four) hours as needed. For shortness of breath 05/14/18   McLean-Scocuzza, Nino Glow, MD  amLODipine (NORVASC) 5 MG tablet Take 5 mg by mouth daily.    [provider]  aspirin 81 MG EC tablet Take 81 mg by mouth daily.     [provider]  atorvastatin (LIPITOR) 20 MG tablet Take 1 tablet (20 mg total) by mouth daily at 6 PM. 10/03/18   McLean-Scocuzza, Nino Glow, MD  budesonide-formoterol [facility]) 160-4.5 MCG/ACT inhaler Inhale 2 puffs into the lungs 2 (two) times daily. Rinse mouth 05/14/18   McLean-Scocuzza, Nino Glow, MD  cetirizine (ZYRTEC) 10 MG tablet Take 10 mg by mouth daily.    [provider]  Cholecalciferol 50000 units capsule Take 1 capsule (50,000 Units total) by mouth once a week. 12/10/17   McLean-Scocuzza, Nino Glow, MD  clobetasol ointment (TEMOVATE) 5.73 % Apply 1 application topically 2 (two) times daily. Right ear as needed not face or private 04/10/18   McLean-Scocuzza, Nino Glow, MD  clotrimazole (LOTRIMIN) 1 % cream Apply 1 application topically 2 (two) times daily. 09/20/18   McLean-Scocuzza, Nino Glow, MD  diclofenac (FLECTOR) 1.3 % PTCH Place 1 patch onto the skin  2 (two) times daily. 11/30/17   McLean-Scocuzza, Nino Glow, MD  dicyclomine (BENTYL) 10 MG capsule Take 1 capsule (10 mg total) by mouth 4 (four) times daily -  before meals and at bedtime. 06/26/18   Jonathon Bellows, MD  DULoxetine (CYMBALTA) 60 MG capsule Take 1 capsule (60 mg total) by mouth daily. 01/11/18   McLean-Scocuzza, Nino Glow, MD  fluticasone (FLONASE) 50 MCG/ACT nasal spray Place 2 sprays into both nostrils daily. 05/14/18   McLean-Scocuzza, Nino Glow, MD  furosemide (LASIX) 40 MG tablet Take 1 tablet (40 mg total) by mouth 2 (two) times daily. In am and at lunch 07/10/18   McLean-Scocuzza, Nino Glow, MD  hydrocortisone 2.5 % cream Apply topically 2 (two) times daily. Left face left of eye 07/10/18   McLean-Scocuzza, Nino Glow, MD  hydrOXYzine (ATARAX/VISTARIL) 25 MG tablet Take 1 tablet (25 mg total) by mouth 3 (three) times daily as needed. 05/14/18   McLean-Scocuzza, Nino Glow, MD  isosorbide mononitrate (IMDUR) 30 MG 24 hr tablet Take 1 tablet (30  mg total) by mouth daily. 01/11/18   McLean-Scocuzza, Nino Glow, MD  ketorolac (ACULAR) 0.5 % ophthalmic solution  12/03/17   [provider]  meloxicam (MOBIC) 7.5 MG tablet Take 1 tablet (7.5 mg total) by mouth 2 (two) times daily as needed for pain. 07/07/18   McLean-Scocuzza, Nino Glow, MD  metFORMIN (GLUCOPHAGE) 500 MG tablet Take 1 tablet (500 mg total) by mouth daily with breakfast. 11/23/17   McLean-Scocuzza, Nino Glow, MD  metoprolol tartrate (LOPRESSOR) 25 MG tablet Take 25 mg by mouth 2 (two) times daily.    [provider]  montelukast (SINGULAIR) 10 MG tablet Take 10 mg by mouth at bedtime.    [provider]  mupirocin ointment (BACTROBAN) 2 % Apply 1 application topically 2 (two) times daily. Any open sores 07/10/18   McLean-Scocuzza, Nino Glow, MD  nitroGLYCERIN (NITROSTAT) 0.4 MG SL tablet Place 0.4 mg under the tongue every 5 (five) minutes as needed. For chest pain    [provider]  omeprazole (PRILOSEC) 40 MG capsule Take 1  capsule (40 mg total) by mouth daily. 06/26/18 08/27/18  Jonathon Bellows, MD  ondansetron (ZOFRAN) 4 MG tablet Take 1 tablet (4 mg total) by mouth every 8 (eight) hours as needed for nausea or vomiting. 12/19/17   Guse, Jacquelynn Cree, FNP  oxyCODONE-acetaminophen (PERCOCET) 5-325 MG tablet Take 1 tablet by mouth 2 (two) times daily as needed for severe pain. 09/20/18   McLean-Scocuzza, Nino Glow, MD  pantoprazole (PROTONIX) 20 MG tablet TAKE 1 TABLET EVERY DAY 04/29/18   Burnard Hawthorne, FNP  potassium chloride SA (K-DUR,KLOR-CON) 20 MEQ tablet Take 1 tablet (20 mEq total) by mouth daily. 07/10/18   McLean-Scocuzza, Nino Glow, MD  prednisoLONE acetate (PRED FORTE) 1 % ophthalmic suspension  11/30/17   [provider]  Secukinumab (COSENTYX 300 DOSE [place]) Inject into the skin every 30 (thirty) days.    [provider]  spironolactone (ALDACTONE) 25 MG tablet Take 1 tablet (25 mg total) by mouth daily. In am for blood pressure 05/03/18   McLean-Scocuzza, Nino Glow, MD  sucralfate (CARAFATE) 1 g tablet Take 1 tablet (1 g total) by mouth 4 (four) times daily -  with meals and at bedtime. 02/24/18 02/24/19  Merlyn Lot, MD  sulfamethoxazole-trimethoprim (BACTRIM DS) 800-160 MG tablet Take 1 tablet by mouth 2 (two) times daily. 10/08/18   Cuthriell, Charline Bills, PA-C  triamcinolone cream (KENALOG) 0.1 % Apply 1 application topically 2 (two) times daily. Not face, groin, underarms 11/15/17   McLean-Scocuzza, Nino Glow, MD    Allergies Patient has no known allergies.  Family History  Problem Relation Age of Onset  . Cancer Mother   . Heart disease Father   . Stroke Father   . Heart disease Brother     Social History Social History   Tobacco Use  . Smoking status: Former Research scientist (life sciences)  . Smokeless tobacco: Never Used  Substance Use Topics  . Alcohol use: No    Frequency: Never  . Drug use: No     Review of Systems  Constitutional: No fever/chills Eyes: No visual changes.  Cardiovascular: no  chest pain. Respiratory: no cough. No SOB. Gastrointestinal: No abdominal pain.  No nausea, no vomiting.   Musculoskeletal: Negative for musculoskeletal pain. Skin: Positive for erythematous and edematous lesion surrounding probable bug bite. Neurological: Negative for headaches, focal weakness or numbness. 10-point ROS otherwise negative.  ____________________________________________   PHYSICAL EXAM:  VITAL SIGNS: ED Triage Vitals [10/08/18 2146]  Enc Vitals  Group     BP (!) 149/89     Pulse Rate 85     Resp 20     Temp 98.8 F (37.1 C)     Temp Source Oral     SpO2 95 %     Weight 235 lb (106.6 kg)     Height 5\' 7"  (1.702 m)     Head Circumference      Peak Flow      Pain Score 10     Pain Loc      Pain Edu?      Excl. in [place]?      Constitutional: Alert and oriented. Well appearing and in no acute distress. Eyes: Conjunctivae are normal. PERRL. EOMI. Head: Atraumatic. ENT:      Ears:       Nose: No congestion/rhinnorhea.      Mouth/Throat: Mucous membranes are moist.  Neck: No stridor.    Cardiovascular: Normal rate, regular rhythm. Normal S1 and S2.  Good peripheral circulation. Respiratory: Normal respiratory effort without tachypnea or retractions. Lungs CTAB. Good air entry to the bases with no decreased or absent breath sounds. Musculoskeletal: Full range of motion to all extremities. No gross deformities appreciated. Neurologic:  Normal speech and language. No gross focal neurologic deficits are appreciated.  Skin:  Skin is warm, dry and intact. No rash noted.  Visualization of the left foot reveals excoriation with scab consistent with scratched bug bite.  Mild surrounding erythema and edema.  Total measurement of erythema measures approximately 5 cm in diameter.  No fluctuance or induration.  No streaking.  Dorsalis pedis pulse intact.  Sensation intact all digits.  Capillary refill less than 2 seconds all digits. Psychiatric: Mood and affect are normal. Speech  and behavior are normal. Patient exhibits appropriate insight and judgement.   ____________________________________________   LABS (all labs ordered are listed, but only abnormal results are displayed)  Labs Reviewed - No data to display ____________________________________________  EKG   ____________________________________________  RADIOLOGY   No results found.  ____________________________________________    PROCEDURES  Procedure(s) performed:    Procedures    Medications  sulfamethoxazole-trimethoprim (BACTRIM DS) 800-160 MG per tablet 1 tablet (has no administration in time range)     ____________________________________________   INITIAL IMPRESSION / ASSESSMENT AND PLAN / ED COURSE  Pertinent labs & imaging results that were available during my care of the patient were reviewed by me and considered in my medical decision making (see chart for details).  Review of the [place] CSRS was performed in accordance of the [place] prior to dispensing any controlled drugs.           Patient's diagnosis is consistent with infected insect bite.  Patient presented to the emergency department with a complaint of possible infected bug bite to left foot.  Patient has findings consistent with previous bug bite that was excoriated with scratching.  Now mild surrounding erythema and edema concerning for mild cellulitis.  Patient will be placed on antibiotics.  Follow-up primary care as needed..  Patient is given ED precautions to return to the ED for any worsening or new symptoms.     ____________________________________________  FINAL CLINICAL IMPRESSION(S) / ED DIAGNOSES  Final diagnoses:  Bug bite with infection, initial encounter      NEW MEDICATIONS STARTED DURING THIS VISIT:  ED Discharge Orders         Ordered    sulfamethoxazole-trimethoprim (BACTRIM DS) 800-160 MG tablet  2 times daily  10/08/18 2252              This chart was dictated using  voice recognition software/Dragon. Despite best efforts to proofread, errors can occur which can change the meaning. Any change was purely unintentional.    Darletta Moll, PA-C 10/08/18 2254    Harvest Dark, MD 10/08/18 [phone]

## 2018-10-08 NOTE — ED Triage Notes (Signed)
Pt has possible insect bite to top of left foot x 2 days. Pt  Reports itching.  No drainage.  Area red.  Pt alert.

## 2018-10-09 ENCOUNTER — Telehealth: Payer: Self-pay | Admitting: *Deleted

## 2018-10-09 NOTE — Telephone Encounter (Signed)
Pt called and asked why no one has returned his call and asked to speak with Dr. Jacklynn Lewis or nurse/ advise Pt that office was closed and someone will call him tomorrow for an appointment/ please advise

## 2018-10-09 NOTE — Telephone Encounter (Signed)
Copied from [phone]. Topic: Appointment Scheduling - Scheduling Inquiry for Clinic >> Oct 09, 2018 11:13 AM Yvette Rack wrote: Reason for CRM: Pt stated he went to the ED because something bit him on his left foot. Pt stated he was told that his foot was infected and he was given some medication but it is still painful and he would like to schedule an appt with Dr. Olivia Mackie. Attempted to connect to the office but there was no answer. Pt requests call back

## 2018-10-10 ENCOUNTER — Ambulatory Visit
Admission: RE | Admit: 2018-10-10 | Discharge: 2018-10-10 | Disposition: A | Discharge status: Home / Self Care | Payer: Medicare HMO | Source: Ambulatory Visit | Attending: Internal Medicine | Admitting: Internal Medicine

## 2018-10-10 ENCOUNTER — Other Ambulatory Visit: Payer: Self-pay

## 2018-10-10 ENCOUNTER — Other Ambulatory Visit
Admission: RE | Admit: 2018-10-10 | Discharge: 2018-10-10 | Disposition: A | Discharge status: Home / Self Care | Payer: Medicare HMO | Source: Ambulatory Visit | Attending: Internal Medicine | Admitting: Internal Medicine

## 2018-10-10 ENCOUNTER — Ambulatory Visit (INDEPENDENT_AMBULATORY_CARE_PROVIDER_SITE_OTHER): Payer: Medicare HMO | Admitting: Internal Medicine

## 2018-10-10 ENCOUNTER — Ambulatory Visit: Payer: Self-pay

## 2018-10-10 ENCOUNTER — Telehealth: Payer: Self-pay | Admitting: Internal Medicine

## 2018-10-10 DIAGNOSIS — R110 Nausea: Secondary | ICD-10-CM | POA: Diagnosis not present

## 2018-10-10 DIAGNOSIS — R1084 Generalized abdominal pain: Secondary | ICD-10-CM | POA: Diagnosis not present

## 2018-10-10 DIAGNOSIS — R109 Unspecified abdominal pain: Secondary | ICD-10-CM | POA: Diagnosis not present

## 2018-10-10 DIAGNOSIS — L03116 Cellulitis of left lower limb: Secondary | ICD-10-CM

## 2018-10-10 HISTORY — DX: Unspecified asthma, uncomplicated: J45.909

## 2018-10-10 LAB — URINALYSIS, ROUTINE W REFLEX MICROSCOPIC
Bacteria, UA: NONE SEEN
Bilirubin Urine: NEGATIVE
Glucose, UA: NEGATIVE mg/dL
Hgb urine dipstick: NEGATIVE
Ketones, ur: NEGATIVE mg/dL
Leukocytes,Ua: NEGATIVE
Nitrite: NEGATIVE
Protein, ur: 30 mg/dL — AB
Specific Gravity, Urine: 1.034 — ABNORMAL HIGH (ref 1.005–1.030)
pH: 5 (ref 5.0–8.0)

## 2018-10-10 LAB — CBC WITH DIFFERENTIAL/PLATELET
Abs Immature Granulocytes: 0.02 10*3/uL (ref 0.00–0.07)
Basophils Absolute: 0.1 10*3/uL (ref 0.0–0.1)
Basophils Relative: 1 %
Eosinophils Absolute: 0.1 10*3/uL (ref 0.0–0.5)
Eosinophils Relative: 2 %
HCT: 44.6 % (ref 39.0–52.0)
Hemoglobin: 14.6 g/dL (ref 13.0–17.0)
Immature Granulocytes: 0 %
Lymphocytes Relative: 30 %
Lymphs Abs: 1.8 10*3/uL (ref 0.7–4.0)
MCH: 32.5 pg (ref 26.0–34.0)
MCHC: 32.7 g/dL (ref 30.0–36.0)
MCV: 99.3 fL (ref 80.0–100.0)
Monocytes Absolute: 0.5 10*3/uL (ref 0.1–1.0)
Monocytes Relative: 9 %
Neutro Abs: 3.3 10*3/uL (ref 1.7–7.7)
Neutrophils Relative %: 58 %
Platelets: 130 10*3/uL — ABNORMAL LOW (ref 150–400)
RBC: 4.49 MIL/uL (ref 4.22–5.81)
RDW: 13.7 % (ref 11.5–15.5)
WBC: 5.9 10*3/uL (ref 4.0–10.5)
nRBC: 0 % (ref 0.0–0.2)

## 2018-10-10 LAB — COMPREHENSIVE METABOLIC PANEL
ALT: 19 U/L (ref 0–44)
AST: 15 U/L (ref 15–41)
Albumin: 3.8 g/dL (ref 3.5–5.0)
Alkaline Phosphatase: 70 U/L (ref 38–126)
Anion gap: 11 (ref 5–15)
BUN: 18 mg/dL (ref 8–23)
CO2: 31 mmol/L (ref 22–32)
Calcium: 8.4 mg/dL — ABNORMAL LOW (ref 8.9–10.3)
Chloride: 94 mmol/L — ABNORMAL LOW (ref 98–111)
Creatinine, Ser: 1.05 mg/dL (ref 0.61–1.24)
GFR calc Af Amer: >60 mL/min (ref >60)
GFR calc non Af Amer: >60 mL/min (ref >60)
Glucose, Bld: 78 mg/dL (ref 70–99)
Potassium: 3.7 mmol/L (ref 3.5–5.1)
Sodium: 136 mmol/L (ref 135–145)
Total Bilirubin: 0.5 mg/dL (ref 0.3–1.2)
Total Protein: 6.9 g/dL (ref 6.5–8.1)

## 2018-10-10 LAB — LIPASE, BLOOD: Lipase: 36 U/L (ref 11–51)

## 2018-10-10 MED ORDER — IOHEXOL 300 MG/ML  SOLN
100.0000 mL | Freq: Once | INTRAMUSCULAR | Status: AC | PRN
  Administered 2018-10-10: 100 mL via INTRAVENOUS

## 2018-10-10 NOTE — Telephone Encounter (Signed)
Pt has been seen this morning

## 2018-10-10 NOTE — Progress Notes (Signed)
Telephone Note  I connected with Nicholas Reyes  on 10/10/18 at 10:05 AM EDT by telephone and verified that I am speaking with the correct person using two identifiers.  Location patient: home Location provider:work Persons participating in the virtual visit: patient, provider  I discussed the limitations of evaluation and management by telemedicine and the availability of in person appointments. The patient expressed understanding and agreed to proceed.   HPI: 1. C/o mid abdominal pain R>L x a few days which is sharp and severe worse with food. Pain comes and goes he did start Abx 10/08/2018 for left food bite. Denies constipation, diarrhea, peeing ok no blood in urine. He states he possibly has a h/o kidney stones.   2. Left foot bite ? Type of bug noted when pt was in the bed. He went to ED 10/08/2018 due to foot being red and warm and given Bactrim bid x 1 week which he is currently on.     ROS: See pertinent positives and negatives per HPI.  Past Medical History:  Diagnosis Date  . Arthritis   . CAD (coronary artery disease)   . COPD (chronic obstructive pulmonary disease) ([place])   . Depression   . Diabetes mellitus   . Heart disease    cad with 3 stents   . Hypertension   . MI (myocardial infarction) ([place])   . Migraines   . Psoriasis    joint pain in knees left hand     Past Surgical History:  Procedure Laterality Date  . APPENDECTOMY    . CATARACT EXTRACTION Bilateral    inserted lens b/l eyes right 03/12/17, left 05/28/17   . CHOLECYSTECTOMY    . CORONARY ANGIOPLASTY WITH STENT PLACEMENT     LAD, LCX-OM, ? 3rd location  . EYE SURGERY     cataract b/l   . HEMORRHOID SURGERY    . hemorrhoid surgery      x 2   . KNEE SURGERY     right knee arthroscopy, partial synovectecomy 04/21/15 and meniscectomy/chondroplasty   . LEFT HEART CATH AND CORONARY ANGIOGRAPHY Left 01/30/2018   Procedure: LEFT HEART CATH AND CORONARY ANGIOGRAPHY;  Surgeon: Isaias Cowman, MD;   Location: [place] [facility];  Service: Cardiovascular;  Laterality: Left;  . right knee surgery     ? type of procedure   . STENT PLACEMENT VASCULAR ([place] HX)    . TONSILLECTOMY    . TONSILLECTOMY AND ADENOIDECTOMY      Family History  Problem Relation Age of Onset  . Cancer Mother   . Heart disease Father   . Stroke Father   . Heart disease Brother     SOCIAL HX: lives alone   Current Outpatient Medications:  .  acetaminophen (TYLENOL) 500 MG tablet, Take 1 tablet (500 mg total) by mouth every 6 (six) hours as needed., Disp: 30 tablet, Rfl: 0 .  albuterol (PROVENTIL HFA;VENTOLIN HFA) 108 (90 Base) MCG/ACT inhaler, Inhale 2 puffs into the lungs every 4 (four) hours as needed. For shortness of breath, Disp: 3 Inhaler, Rfl: 4 .  amLODipine (NORVASC) 5 MG tablet, Take 5 mg by mouth daily., Disp: , Rfl:  .  aspirin 81 MG EC tablet, Take 81 mg by mouth daily. , Disp: , Rfl:  .  atorvastatin (LIPITOR) 20 MG tablet, Take 1 tablet (20 mg total) by mouth daily at 6 PM., Disp: 90 tablet, Rfl: 3 .  budesonide-formoterol (SYMBICORT) 160-4.5 MCG/ACT inhaler, Inhale 2 puffs into the lungs 2 (two)  times daily. Rinse mouth, Disp: 1 Inhaler, Rfl: 12 .  cetirizine (ZYRTEC) 10 MG tablet, Take 10 mg by mouth daily., Disp: , Rfl:  .  Cholecalciferol 50000 units capsule, Take 1 capsule (50,000 Units total) by mouth once a week., Disp: 13 capsule, Rfl: 1 .  clobetasol ointment (TEMOVATE) 1.61 %, Apply 1 application topically 2 (two) times daily. Right ear as needed not face or private, Disp: 45 g, Rfl: 0 .  clotrimazole (LOTRIMIN) 1 % cream, Apply 1 application topically 2 (two) times daily., Disp: 60 g, Rfl: 11 .  diclofenac (FLECTOR) 1.3 % PTCH, Place 1 patch onto the skin 2 (two) times daily., Disp: 60 patch, Rfl: 2 .  dicyclomine (BENTYL) 10 MG capsule, Take 1 capsule (10 mg total) by mouth 4 (four) times daily -  before meals and at bedtime., Disp: 90 capsule, Rfl: 0 .  DULoxetine (CYMBALTA) 60 MG  capsule, Take 1 capsule (60 mg total) by mouth daily., Disp: 90 capsule, Rfl: 3 .  fluticasone (FLONASE) 50 MCG/ACT nasal spray, Place 2 sprays into both nostrils daily., Disp: 16 g, Rfl: 6 .  furosemide (LASIX) 40 MG tablet, Take 1 tablet (40 mg total) by mouth 2 (two) times daily. In am and at lunch, Disp: 60 tablet, Rfl: 5 .  hydrocortisone 2.5 % cream, Apply topically 2 (two) times daily. Left face left of eye, Disp: 60 g, Rfl: 0 .  hydrOXYzine (ATARAX/VISTARIL) 25 MG tablet, Take 1 tablet (25 mg total) by mouth 3 (three) times daily as needed., Disp: 30 tablet, Rfl: 0 .  isosorbide mononitrate (IMDUR) 30 MG 24 hr tablet, Take 1 tablet (30 mg total) by mouth daily., Disp: 90 tablet, Rfl: 3 .  ketorolac (ACULAR) 0.5 % ophthalmic solution, , Disp: , Rfl:  .  meloxicam (MOBIC) 7.5 MG tablet, Take 1 tablet (7.5 mg total) by mouth 2 (two) times daily as needed for pain., Disp: 60 tablet, Rfl: 2 .  metFORMIN (GLUCOPHAGE) 500 MG tablet, Take 1 tablet (500 mg total) by mouth daily with breakfast., Disp: 90 tablet, Rfl: 3 .  metoprolol tartrate (LOPRESSOR) 25 MG tablet, Take 25 mg by mouth 2 (two) times daily., Disp: , Rfl:  .  montelukast (SINGULAIR) 10 MG tablet, Take 10 mg by mouth at bedtime., Disp: , Rfl:  .  mupirocin ointment (BACTROBAN) 2 %, Apply 1 application topically 2 (two) times daily. Any open sores, Disp: 60 g, Rfl: 2 .  nitroGLYCERIN (NITROSTAT) 0.4 MG SL tablet, Place 0.4 mg under the tongue every 5 (five) minutes as needed. For chest pain, Disp: , Rfl:  .  ondansetron (ZOFRAN) 4 MG tablet, Take 1 tablet (4 mg total) by mouth every 8 (eight) hours as needed for nausea or vomiting., Disp: 20 tablet, Rfl: 0 .  oxyCODONE-acetaminophen (PERCOCET) 5-325 MG tablet, Take 1 tablet by mouth 2 (two) times daily as needed for severe pain., Disp: 10 tablet, Rfl: 0 .  pantoprazole (PROTONIX) 20 MG tablet, TAKE 1 TABLET EVERY DAY, Disp: 90 tablet, Rfl: 0 .  potassium chloride SA (K-DUR,KLOR-CON) 20  MEQ tablet, Take 1 tablet (20 mEq total) by mouth daily., Disp: 90 tablet, Rfl: 1 .  prednisoLONE acetate (PRED FORTE) 1 % ophthalmic suspension, , Disp: , Rfl:  .  Secukinumab (COSENTYX 300 DOSE [place]), Inject into the skin every 30 (thirty) days., Disp: , Rfl:  .  spironolactone (ALDACTONE) 25 MG tablet, Take 1 tablet (25 mg total) by mouth daily. In am for blood pressure, Disp: 90 tablet,  Rfl: 3 .  sucralfate (CARAFATE) 1 g tablet, Take 1 tablet (1 g total) by mouth 4 (four) times daily -  with meals and at bedtime., Disp: 120 tablet, Rfl: 0 .  sulfamethoxazole-trimethoprim (BACTRIM DS) 800-160 MG tablet, Take 1 tablet by mouth 2 (two) times daily., Disp: 14 tablet, Rfl: 0 .  triamcinolone cream (KENALOG) 0.1 %, Apply 1 application topically 2 (two) times daily. Not face, groin, underarms, Disp: 454 g, Rfl: 11 .  omeprazole (PRILOSEC) 40 MG capsule, Take 1 capsule (40 mg total) by mouth daily., Disp: 90 capsule, Rfl: 3  EXAM:  VITALS per patient if applicable:  GENERAL: alert, oriented, appears well and in no acute distress  HEENT: atraumatic, conjunttiva clear, no obvious abnormalities on inspection of external nose and ears  NECK: normal movements of the head and neck  LUNGS: on inspection no signs of respiratory distress, breathing rate appears normal, no obvious gross SOB, gasping or wheezing  CV: no obvious cyanosis  MS: moves all visible extremities without noticeable abnormality  PSYCH/NEURO: pleasant and cooperative, no obvious depression or anxiety, speech and thought processing grossly intact  Skin: per pt left foot cellulitis less red he removed ace wrap today   ASSESSMENT AND PLAN:  Discussed the following assessment and plan:  Generalized abdominal pain mid abdomen R>L etiology r/o kidney stones vs enteritis vs caused by antibiotics currently on bactrim vs other  Reviewed testicular US 09/19/2018   IMPRESSION: 1. No acute sonographic abnormality detected. There is  no sonographic evidence for testicular torsion or orchitis/epididymitis. 2. Isolated right-sided varicocele. Follow-up with a nonemergent outpatient contrast enhanced CT of the abdomen pelvis is recommended for further evaluation of this finding  - Plan: Creatinine, Comprehensive metabolic panel, CBC w/Diff, Urinalysis, Routine w reflex microscopic, Lipase, blood, CT Abdomen Pelvis W Contrast Consider refer back to GI if pain continues   Nausea - Plan: Creatinine, Comprehensive metabolic panel, CBC w/Diff, Urinalysis, Routine w reflex microscopic, Lipase, blood, CT Abdomen Pelvis W Contrast  -declines meds for nausea currently   Cellulitis of left lower extremity -cont bactrim, bactroban bid prn and can use hc 2.5 % for itching     I discussed the assessment and treatment plan with the patient. The patient was provided an opportunity to ask questions and all were answered. The patient agreed with the plan and demonstrated an understanding of the instructions.   The patient was advised to call back or seek an in-person evaluation if the symptoms worsen or if the condition fails to improve as anticipated.  Time spent 15 minutes  Delorise Jackson, MD

## 2018-10-10 NOTE — Patient Instructions (Signed)
Abdominal Pain, Adult  Abdominal pain can be caused by many things. Often, abdominal pain is not serious and it gets better with no treatment or by being treated at home. However, sometimes abdominal pain is serious. Your health care provider will do a medical history and a physical exam to try to determine the cause of your abdominal pain.  Follow these instructions at home:   Take over-the-counter and prescription medicines only as told by your health care provider. Do not take a laxative unless told by your health care provider.   Drink enough fluid to keep your urine clear or pale yellow.   Watch your condition for any changes.   Keep all follow-up visits as told by your health care provider. This is important.  Contact a health care provider if:   Your abdominal pain changes or gets worse.   You are not hungry or you lose weight without trying.   You are constipated or have diarrhea for more than 2-3 days.   You have pain when you urinate or have a bowel movement.   Your abdominal pain wakes you up at night.   Your pain gets worse with meals, after eating, or with certain foods.   You are throwing up and cannot keep anything down.   You have a fever.  Get help right away if:   Your pain does not go away as soon as your health care provider told you to expect.   You cannot stop throwing up.   Your pain is only in areas of the abdomen, such as the right side or the left lower portion of the abdomen.   You have bloody or black stools, or stools that look like tar.   You have severe pain, cramping, or bloating in your abdomen.   You have signs of dehydration, such as:  ? Dark urine, very little urine, or no urine.  ? Cracked lips.  ? Dry mouth.  ? Sunken eyes.  ? Sleepiness.  ? Weakness.  This information is not intended to replace advice given to you by your health care provider. Make sure you discuss any questions you have with your health care provider.  Document Released: 02/01/2005 Document  Revised: 11/12/2015 Document Reviewed: 10/06/2015  Elsevier Interactive Patient Education  2019 Elsevier Inc.

## 2018-10-10 NOTE — Telephone Encounter (Signed)
Charted in result notes. 

## 2018-10-10 NOTE — Telephone Encounter (Signed)
Copied from [place] [id number]. Topic: Quick Communication - See Telephone Encounter >> Oct 10, 2018  5:20 PM Rutherford Nail, NT wrote: CRM for notification. See Telephone encounter for: 10/10/18. Brandy with the pre-service center calling and states that the patient came in today for a CT. The authorization effective date does not start until tomorrow. States that this date is needing to be changed. Please advise.  CB#: [phone] ext [phone]

## 2018-10-11 ENCOUNTER — Telehealth: Payer: Self-pay | Admitting: Internal Medicine

## 2018-10-11 NOTE — Telephone Encounter (Signed)
Pt called stating that he was returning a call to the office that had gotten dropped after he had waited several minutes. Per documentation in chart pt was in office yesterday. All result have been called. Call placed to [facility] at office for advice.  Pt became very upset and hung up after we were unable to find any documentation that the office had attempted to call him.  He ended the call stating that he wanted them to call him back.  NT spoke again t office Juliann Pulse will inform Dr Mclean-scocuzza staff of patient request.

## 2018-10-14 ENCOUNTER — Ambulatory Visit: Payer: Medicare HMO | Admitting: Urology

## 2018-10-18 DIAGNOSIS — M1712 Unilateral primary osteoarthritis, left knee: Secondary | ICD-10-CM | POA: Diagnosis not present

## 2018-10-18 DIAGNOSIS — M25561 Pain in right knee: Secondary | ICD-10-CM | POA: Diagnosis not present

## 2018-10-18 DIAGNOSIS — G8929 Other chronic pain: Secondary | ICD-10-CM | POA: Diagnosis not present

## 2018-10-18 DIAGNOSIS — M1711 Unilateral primary osteoarthritis, right knee: Secondary | ICD-10-CM | POA: Diagnosis not present

## 2018-10-18 DIAGNOSIS — M170 Bilateral primary osteoarthritis of knee: Secondary | ICD-10-CM | POA: Diagnosis not present

## 2018-10-18 DIAGNOSIS — M25562 Pain in left knee: Secondary | ICD-10-CM | POA: Diagnosis not present

## 2018-10-23 ENCOUNTER — Other Ambulatory Visit: Payer: Self-pay | Admitting: *Deleted

## 2018-10-23 NOTE — Patient Outreach (Signed)
[facility] [facility]) Care Management  10/23/2018  Nicholas Reyes June 24, 1955 [id number]   [employer]  Referral Date:02/25/2018 Referral Source:[facility] ED Census Reason for Referral:6 or more ED visits in the past 6 months Insurance:[employer] Medicare & Medicaid   Outreach Attempt:  Outreach attempt #2 to patient for follow up.  No answer and HIPAA compliant voice message left.  In process of documenting, received call back from patient.  Call was disconnected while attempting to verify HIPAA with patient.  Attempted to outreach patient again and line went to voicemail.  Again HIPAA compliant voice message left.  Plan:  RN Health Coach will attempt another outreach to patient within the month of June if no return call back from patient.  [place] [phone] [email or web address]

## 2018-10-24 ENCOUNTER — Ambulatory Visit: Payer: Medicare HMO | Admitting: Urology

## 2018-10-25 ENCOUNTER — Other Ambulatory Visit: Payer: Self-pay | Admitting: Orthopedic Surgery

## 2018-10-25 DIAGNOSIS — G8929 Other chronic pain: Secondary | ICD-10-CM

## 2018-10-25 DIAGNOSIS — M25561 Pain in right knee: Secondary | ICD-10-CM | POA: Diagnosis not present

## 2018-10-25 DIAGNOSIS — M170 Bilateral primary osteoarthritis of knee: Secondary | ICD-10-CM | POA: Diagnosis not present

## 2018-11-04 ENCOUNTER — Ambulatory Visit: Admission: RE | Admit: 2018-11-04 | Payer: Medicare HMO | Source: Ambulatory Visit

## 2018-11-05 ENCOUNTER — Other Ambulatory Visit: Payer: Self-pay | Admitting: *Deleted

## 2018-11-05 ENCOUNTER — Telehealth: Payer: Self-pay | Admitting: Internal Medicine

## 2018-11-05 NOTE — Telephone Encounter (Signed)
Nicholas Reyes have you already done? If not please let me know.

## 2018-11-05 NOTE — Telephone Encounter (Signed)
Pt needs test strip refills and wanted Fransisco Beau to call humana and see what strips they now cover. Pt stated this is our responsibility since we make him test his blood sugar Please advise pt with info

## 2018-11-05 NOTE — Telephone Encounter (Signed)
No, I ask patient to check with insurance to see which meter they will cover and strips.

## 2018-11-05 NOTE — Patient Outreach (Signed)
[facility]) Care Management  11/05/2018  ZAYVEON RASCHKE 1956-01-28 [id number]   [place]  Referral Date:02/25/2018 Referral Source:[facility] ED Census Reason for Referral:6 or more ED visits in the past 6 months Insurance:[employer] Medicare & Medicaid   Outreach Attempt:  Outreach attempt #3 to patient for follow up. No answer. RN Health Coach left HIPAA compliant voicemail message along with contact information.  Plan:  RN Health Coach will make another outreach attempt within the month of July.  RN Health coach will send patient Unsuccessful Outreach Letter.  [place] [phone] [email or web address]

## 2018-11-06 ENCOUNTER — Other Ambulatory Visit: Payer: Self-pay

## 2018-11-06 MED ORDER — GLUCOSE BLOOD VI STRP: 1.0000 | ORAL_STRIP | Freq: Three times a day (TID) | 2 refills | Status: AC | PRN

## 2018-11-14 ENCOUNTER — Other Ambulatory Visit: Payer: Self-pay | Admitting: *Deleted

## 2018-11-14 NOTE — Patient Outreach (Signed)
[place] [facility]) Care Management  11/14/2018  CONSTANTINOS KREMPASKY 1955-12-06 [id number]   [place]  Referral Date:02/25/2018 Referral Source:[facility] ED Census Reason for Referral:6 or more ED visits in the past 6 months Insurance:[employer] Medicare & Medicaid   Outreach Attempt:  Received incoming call from patient.  HIPAA verified with patient.  Patient reporting he was just notified he is scheduled to have knee surgery on August 11.  Unsure of exact time and place.  He is stating he will need assistance at home after his knee surgery and will not be able to drive for about 2 months.  RN Health Coach asked patient if his sister would be available to help him after surgery and patient stated no because she is caring for her husband whom is ill.  Asked if there is any family of friends to assist with care after surgery and patient stated no.  Asked if patient discussed home health services with Orthopedic Surgeon and patient stated he has not.  Encouraged patient to discuss home health need with surgeon and to discuss needing assistance after surgery with his sister and other family members.  Discussed with patient possible personal care services aid and [facility] Social Work referral.  Patient verbally agrees to [place] [facility] referral.  As Research scientist (medical) was encouraging patient to answer telephone when [facility] Social Worker calls, patient stated he would and hung up line.  Plan:  RN Health Coach will place [facility] Social Work referral for possible assistance with Social research officer, government.  RN Health Coach will attempt another outreach to patient as previously scheduled within the month of July.  [place] [phone] [place] [email or web address]

## 2018-11-18 ENCOUNTER — Other Ambulatory Visit: Payer: Self-pay

## 2018-11-18 NOTE — Patient Outreach (Signed)
[facility] [facility]) Care Management  11/18/2018  Nicholas Reyes 09-Jul-1955 [id number]   Social work referral received from [employer], Hubert Azure, to contact patient about personal care services post knee surgery.  Unsuccessful outreach today.  Went straight to voicemail; left message.  Will attempt to reach again within four business days.   Unsuccessful outreach letter mailed.  Ronn Melena, BSW Social Worker [phone]

## 2018-11-20 ENCOUNTER — Other Ambulatory Visit: Payer: Self-pay

## 2018-11-20 ENCOUNTER — Ambulatory Visit: Payer: Self-pay

## 2018-11-20 NOTE — Patient Outreach (Signed)
[facility]) Care Management  11/20/2018  Nicholas Reyes [birth year] [id number]   Social work referral received from [employer], Hubert Azure, to contact patient about personal care services post knee surgery.  Second unsuccessful outreach today; left message.  Will attempt to reach again within four business days.   Unsuccessful outreach letter mailed on 11/18/18.    Ronn Melena, BSW Social Worker [phone]

## 2018-11-21 ENCOUNTER — Telehealth: Payer: Self-pay

## 2018-11-21 NOTE — Telephone Encounter (Signed)
Pt taking atorvastatin daily

## 2018-11-25 ENCOUNTER — Other Ambulatory Visit: Payer: Self-pay

## 2018-11-25 ENCOUNTER — Ambulatory Visit: Payer: Self-pay

## 2018-11-25 NOTE — Patient Outreach (Signed)
[facility] [facility]) Care Management  11/25/2018  TOMASZ STEEVES [birth year] [id number]   Social work referral received from [employer], Hubert Azure, to contact patient about personal care services post knee surgery. Third unsuccessful outreach today; left message.  Unsuccessful outreach letter mailed on 11/18/18.   Will close case if no response by 11/29/18.  Ronn Melena, BSW Social Worker [phone]

## 2018-11-26 ENCOUNTER — Other Ambulatory Visit: Payer: Self-pay | Admitting: *Deleted

## 2018-11-26 NOTE — Patient Outreach (Signed)
[name] Care Management  11/26/2018  Nicholas Reyes November 14, 1955 [id number]   [name]  Referral Date:02/25/2018 Referral Source:[facility] ED Census Reason for Referral:6 or more ED visits in the past 6 months Insurance:[employer] Medicare & Medicaid   Outreach Attempt:  Outreach attempt #4 to patient for follow up. No answer. RN Health Coach left HIPAA compliant voicemail message along with contact information.  As [place] documenting, received telephone call back from patient.  RN Health Coach introduced self and patient stated "I'm sleeping" and hung up line.  Plan:  RN Health Coach will attempt another telephone outreach to patient within the month of August.  [name] RN [place] [phone] [name]

## 2018-11-29 ENCOUNTER — Other Ambulatory Visit: Payer: Self-pay

## 2018-11-29 NOTE — Patient Outreach (Signed)
[facility]) Care Management  11/29/2018  Nicholas Reyes Oct 14, 1955 [id number]   [facility] BSW closing case due to inability to contact  [employer], [place] Worker [phone]

## 2018-12-04 ENCOUNTER — Ambulatory Visit: Payer: Medicare HMO | Admitting: Gastroenterology

## 2018-12-05 ENCOUNTER — Telehealth: Payer: Self-pay | Admitting: Internal Medicine

## 2018-12-05 ENCOUNTER — Telehealth: Payer: Self-pay

## 2018-12-05 NOTE — Telephone Encounter (Signed)
Called and spoke to pt.  Pt said that his knees are hurting really bad and needs pain medication.  Informed pt that his refill request for oxycodone has already been sent over to Dr. Aundra Dubin.

## 2018-12-05 NOTE — Telephone Encounter (Signed)
Unable to leave message for patient to return call back. PEC may give information.  

## 2018-12-05 NOTE — Telephone Encounter (Signed)
Medication Refill - Medication:  oxyCODONE-acetaminophen (PERCOCET) 5-325 MG tablet   Has the patient contacted their pharmacy? Yes advised to call office. Patient having surgery on 11th and is wanting about 5-6 pills.   Preferred Pharmacy (with phone number or street name):  [facility] (N), [place] [phone] (Phone) [phone] (Fax)   Agent: Please be advised that RX refills may take up to 3 business days. We ask that you follow-up with your pharmacy.

## 2018-12-05 NOTE — Telephone Encounter (Signed)
Last Refill:  09/20/18  Last OV:     10/10/18

## 2018-12-05 NOTE — Telephone Encounter (Signed)
Copied from [place] [id number]. Topic: General - Other >> Dec 05, 2018  2:45 PM Keene Breath wrote: Reason for CRM: Patient called to request that the doctor or nurse call him in some pain medication.  He stated that his knees are hurting very bad and he needs something to help the pain.  Please advise.  CB# [phone]

## 2018-12-05 NOTE — Telephone Encounter (Signed)
Percocet I will not be refilling for him this will need to come from pain clinic or orthopedics   I dont do long term doses of percocet   [place]

## 2018-12-05 NOTE — Telephone Encounter (Signed)
He needs to see orthopedics or the pain clinic   [place]

## 2018-12-10 ENCOUNTER — Inpatient Hospital Stay: Admission: RE | Admit: 2018-12-10 | Payer: Medicare HMO | Source: Ambulatory Visit

## 2018-12-12 ENCOUNTER — Other Ambulatory Visit: Payer: Self-pay

## 2018-12-12 ENCOUNTER — Encounter
Admission: RE | Admit: 2018-12-12 | Discharge: 2018-12-12 | Disposition: A | Discharge status: Home / Self Care | Payer: Medicare HMO | Source: Ambulatory Visit | Attending: Orthopedic Surgery | Admitting: Orthopedic Surgery

## 2018-12-12 DIAGNOSIS — Z01818 Encounter for other preprocedural examination: Secondary | ICD-10-CM | POA: Insufficient documentation

## 2018-12-12 DIAGNOSIS — Z20828 Contact with and (suspected) exposure to other viral communicable diseases: Secondary | ICD-10-CM | POA: Insufficient documentation

## 2018-12-12 DIAGNOSIS — I214 Non-ST elevation (NSTEMI) myocardial infarction: Secondary | ICD-10-CM | POA: Diagnosis not present

## 2018-12-12 HISTORY — DX: Dyspnea, unspecified: R06.00

## 2018-12-12 HISTORY — DX: Sleep apnea, unspecified: G47.30

## 2018-12-12 HISTORY — DX: Gastro-esophageal reflux disease without esophagitis: K21.9

## 2018-12-12 LAB — BASIC METABOLIC PANEL
Anion gap: 7 (ref 5–15)
BUN: 10 mg/dL (ref 8–23)
CO2: 34 mmol/L — ABNORMAL HIGH (ref 22–32)
Calcium: 8.8 mg/dL — ABNORMAL LOW (ref 8.9–10.3)
Chloride: 98 mmol/L (ref 98–111)
Creatinine, Ser: 0.7 mg/dL (ref 0.61–1.24)
GFR calc Af Amer: >60 mL/min (ref >60)
GFR calc non Af Amer: >60 mL/min (ref >60)
Glucose, Bld: 116 mg/dL — ABNORMAL HIGH (ref 70–99)
Potassium: 3.7 mmol/L (ref 3.5–5.1)
Sodium: 139 mmol/L (ref 135–145)

## 2018-12-12 LAB — CBC
HCT: 41.5 % (ref 39.0–52.0)
Hemoglobin: 13.3 g/dL (ref 13.0–17.0)
MCH: 32.5 pg (ref 26.0–34.0)
MCHC: 32 g/dL (ref 30.0–36.0)
MCV: 101.5 fL — ABNORMAL HIGH (ref 80.0–100.0)
Platelets: 64 10*3/uL — ABNORMAL LOW (ref 150–400)
RBC: 4.09 MIL/uL — ABNORMAL LOW (ref 4.22–5.81)
RDW: 14.6 % (ref 11.5–15.5)
WBC: 2.9 10*3/uL — ABNORMAL LOW (ref 4.0–10.5)
nRBC: 1 % — ABNORMAL HIGH (ref 0.0–0.2)

## 2018-12-12 LAB — URINALYSIS, ROUTINE W REFLEX MICROSCOPIC
Bilirubin Urine: NEGATIVE
Glucose, UA: NEGATIVE mg/dL
Hgb urine dipstick: NEGATIVE
Ketones, ur: NEGATIVE mg/dL
Leukocytes,Ua: NEGATIVE
Nitrite: NEGATIVE
Protein, ur: NEGATIVE mg/dL
Specific Gravity, Urine: 1.03 (ref 1.005–1.030)
pH: 5 (ref 5.0–8.0)

## 2018-12-12 LAB — SEDIMENTATION RATE: Sed Rate: 19 mm/hr (ref 0–20)

## 2018-12-12 LAB — TYPE AND SCREEN
ABO/RH(D): A POS
Antibody Screen: NEGATIVE

## 2018-12-12 LAB — SURGICAL PCR SCREEN
MRSA, PCR: NEGATIVE
Staphylococcus aureus: NEGATIVE

## 2018-12-12 LAB — PROTIME-INR
INR: 0.9 (ref 0.8–1.2)
Prothrombin Time: 12.3 seconds (ref 11.4–15.2)

## 2018-12-12 LAB — APTT: aPTT: 31 seconds (ref 24–36)

## 2018-12-12 NOTE — Patient Instructions (Signed)
Your procedure is scheduled on: 12-17-18 TUESDAY Report to Same Day Surgery 2nd floor medical mall [facility] Entrance-take elevator on left to 2nd floor.  Check in with surgery information desk.) To find out your arrival time please call [phone] between 1PM - 3PM on 12-16-18 MONDAY  Remember: Instructions that are not followed completely may result in serious medical risk, up to and including death, or upon the discretion of your surgeon and anesthesiologist your surgery may need to be rescheduled.    _x___ 1. Do not eat food after midnight the night before your procedure. NO GUM OR CANDY AFTER MIDNIGHT. You may drink WATER up to 2 hours before you are scheduled to arrive at the hospital for your procedure.  Do not drink WATER liquids within 2 hours of your scheduled arrival to the hospital.  Type 1 and type 2 diabetics should only drink water.   ____Ensure clear carbohydrate drink on the way to the hospital for bariatric patients  ____Ensure clear carbohydrate drink 3 hours before surgery.    __x__ 2. No Alcohol for 24 hours before or after surgery.   __x__3. No Smoking or e-cigarettes for 24 prior to surgery.  Do not use any chewable tobacco products for at least 6 hour prior to surgery   ____  4. Bring all medications with you on the day of surgery if instructed.    __x__ 5. Notify your doctor if there is any change in your medical condition     (cold, fever, infections).    x___6. On the morning of surgery brush your teeth with toothpaste and water.  You may rinse your mouth with mouth wash if you wish.  Do not swallow any toothpaste or mouthwash.   Do not wear jewelry, make-up, hairpins, clips or nail polish.  Do not wear lotions, powders, or perfumes. You may wear deodorant.  Do not shave 48 hours prior to surgery. Men may shave face and neck.  Do not bring valuables to the hospital.    [facility] is not responsible for any belongings or valuables.    Contacts, dentures or bridgework may not be worn into surgery.  Leave your suitcase in the car. After surgery it may be brought to your room.  For patients admitted to the hospital, discharge time is determined by your treatment team.  _  Patients discharged the day of surgery will not be allowed to drive home.  You will need someone to drive you home and stay with you the night of your procedure.    Please read over the following fact sheets that you were given:   [facility] Preparing for Surgery and or MRSA Information   _x___ Take anti-hypertensive listed below, cardiac, seizure, asthma, anti-reflux and psychiatric medicines. These include:  1. DO NOT TAKE ANY MEDICINE THE MORNING OF SURGERY -BRING ALL YOUR MEDICATIONS TO [place] THE MORNING OF SURGERY  2.  3.  4.  5.  6.  ____Fleets enema or Magnesium Citrate as directed.   _x___ Use CHG Soap or sage wipes as directed on instruction sheet   _X___ Use inhalers on the day of surgery and bring to hospital day of surgery-USE YOUR SYMBICORT INHALER AND YOUR ALBUTEROL INHALER DAY OF SURGERY AND [place]  _X___ Stop Metformin 2 days prior to surgery-LAST DOSE ON Saturday, AUGUST 8TH   ____ Take 1/2 of usual insulin dose the night before surgery and none on the morning surgery.   _x___ Follow recommendations from  Cardiologist, Pulmonologist or PCP regarding stopping Aspirin, Coumadin, Plavix ,Eliquis, Effient, or Pradaxa, and Pletal-ASK DR [facility] ABOUT STOPPING YOUR ASPIRIN AND PLAVIX  X____Stop Anti-inflammatories such as Advil, Aleve, Ibuprofen, Motrin, Naproxen, Naprosyn, Goodies powders or aspirin products NOW-OK to take Tylenol    ____ Stop supplements until after surgery.     _X___ Bring C-Pap to the hospital.

## 2018-12-12 NOTE — Pre-Procedure Instructions (Signed)
Pt with recent cp and having to use nitro last week-EKG normal today-Dr kephart wanting cardiac clearance-Also platelet count done today is 64,000. Pt needs medical clearance per dr Ronelle Nigh as pt does not see a hematologist. Omar Person and informed her of both clearances. Faxed this to cardiology, pcp and dr Rudene Christians

## 2018-12-13 ENCOUNTER — Other Ambulatory Visit: Payer: Medicare HMO

## 2018-12-13 LAB — URINE CULTURE: Culture: >=10000 — AB

## 2018-12-13 LAB — SARS CORONAVIRUS 2 (TAT 6-24 HRS): SARS Coronavirus 2: NEGATIVE

## 2018-12-16 ENCOUNTER — Telehealth: Payer: Self-pay

## 2018-12-16 ENCOUNTER — Other Ambulatory Visit: Payer: Self-pay | Admitting: *Deleted

## 2018-12-16 DIAGNOSIS — R079 Chest pain, unspecified: Secondary | ICD-10-CM | POA: Diagnosis not present

## 2018-12-16 DIAGNOSIS — I10 Essential (primary) hypertension: Secondary | ICD-10-CM | POA: Diagnosis not present

## 2018-12-16 DIAGNOSIS — E119 Type 2 diabetes mellitus without complications: Secondary | ICD-10-CM | POA: Diagnosis not present

## 2018-12-16 DIAGNOSIS — I2510 Atherosclerotic heart disease of native coronary artery without angina pectoris: Secondary | ICD-10-CM | POA: Diagnosis not present

## 2018-12-16 DIAGNOSIS — I25118 Atherosclerotic heart disease of native coronary artery with other forms of angina pectoris: Secondary | ICD-10-CM | POA: Diagnosis not present

## 2018-12-16 DIAGNOSIS — Z01810 Encounter for preprocedural cardiovascular examination: Secondary | ICD-10-CM | POA: Diagnosis not present

## 2018-12-16 DIAGNOSIS — R0600 Dyspnea, unspecified: Secondary | ICD-10-CM | POA: Diagnosis not present

## 2018-12-16 DIAGNOSIS — E782 Mixed hyperlipidemia: Secondary | ICD-10-CM | POA: Diagnosis not present

## 2018-12-16 NOTE — Telephone Encounter (Signed)
Copied from [place] [id number]. Topic: General - Inquiry >> Dec 16, 2018 12:37 PM Virl Axe D wrote: Reason for CRM: Pt stated that his knee surgery was canceled due to his low white cell count. He was told to contact his PCP on next steps. Please advise.

## 2018-12-16 NOTE — Telephone Encounter (Signed)
Pt called again regarding his pre-admissions appt for knee that was cancelled due to his low white cell count.  Informed pt that note has already been forwarded to PCP's CMA to send to PCP for advisement.

## 2018-12-16 NOTE — Patient Outreach (Signed)
[place] [facility]) Care Management  12/16/2018  Nicholas Reyes Feb 12, 1956 [id number]   [place]  Referral Date:02/25/2018 Referral Source:[facility] ED Census Reason for Referral:6 or more ED visits in the past 6 months Insurance:[employer] Medicare & Medicaid   Outreach Attempt:  Unsuccessful outreach to patient for follow up.  RN Health Coach placed calls x2 to patient.  Patient answered and once [name], patient hung up line.  Plan:  RN Health Coach will make another outreach attempt to patient within the month of August.  Rosellen Lichtenberger RN [place] [phone] [email or web address]

## 2018-12-17 ENCOUNTER — Encounter: Payer: Self-pay | Admitting: Internal Medicine

## 2018-12-17 ENCOUNTER — Ambulatory Visit (INDEPENDENT_AMBULATORY_CARE_PROVIDER_SITE_OTHER): Payer: Medicare HMO | Admitting: Internal Medicine

## 2018-12-17 ENCOUNTER — Inpatient Hospital Stay: Admission: RE | Admit: 2018-12-17 | Payer: Medicare HMO | Source: Ambulatory Visit | Admitting: Orthopedic Surgery

## 2018-12-17 ENCOUNTER — Encounter: Admission: RE | Payer: Self-pay | Source: Ambulatory Visit

## 2018-12-17 ENCOUNTER — Other Ambulatory Visit: Payer: Self-pay

## 2018-12-17 VITALS — BP 122/68 | HR 97 | Temp 98.1°F | Ht 67.0 in | Wt 230.2 lb

## 2018-12-17 DIAGNOSIS — Z125 Encounter for screening for malignant neoplasm of prostate: Secondary | ICD-10-CM | POA: Diagnosis not present

## 2018-12-17 DIAGNOSIS — D61818 Other pancytopenia: Secondary | ICD-10-CM

## 2018-12-17 DIAGNOSIS — D696 Thrombocytopenia, unspecified: Secondary | ICD-10-CM | POA: Diagnosis not present

## 2018-12-17 DIAGNOSIS — R1013 Epigastric pain: Secondary | ICD-10-CM | POA: Diagnosis not present

## 2018-12-17 DIAGNOSIS — D72819 Decreased white blood cell count, unspecified: Secondary | ICD-10-CM

## 2018-12-17 SURGERY — ARTHROPLASTY, KNEE, TOTAL
Anesthesia: Choice | Laterality: Right

## 2018-12-17 NOTE — Patient Instructions (Signed)
Pancytopenia Pancytopenia is a condition in which a person has an abnormally low amount (deficiency) of the following blood cells:  Red blood cells (RBCs). Having too few RBCs is called anemia.  White blood cells (WBCs). Having too few WBCs is called leukopenia.  Cells that help the blood clot (platelets). Having too few platelets is called thrombocytopenia. Cells that become blood cells (stem cells) are made in the soft tissue inside the bones (bone marrow). All blood cells have a limited lifespan. Blood cells are constantly replaced with new blood cells from the bone marrow. Pancytopenia can be caused by any condition or disease that:  Destroys the ability of bone marrow to make blood cells.  Causes bone marrow to make blood cells that cannot survive after they leave the bone marrow. What are the causes? There are many possible causes of this condition. In some cases, the cause is not known. Common causes of the condition include:  A disease that causes bone marrow to make immature blood cells (megaloblastic anemia).  A blood disorder that makes bone marrow unable to produce enough new RBCs (aplastic anemia or bone marrow failure).  An enlarged spleen (hypersplenism). An enlarged spleen can trap blood cells and destroy them faster than they can be replaced.  Inherited diseases of the blood or bone marrow.  Cancers that affect bone marrow.  Certain medicines, such as: ? Chemotherapy. ? Medicines that reduce the activity of the immune system (immunosuppressant medicines).  Exposure to radiation.  Severe infections. What increases the risk? You are more likely to develop this condition if:  You are 30?62 years old.  You are male.  You have a family history of a blood or bone marrow disease.  You have certain conditions, such as: ? Alcohol use disorder. ? HIV (human immunodeficiency virus) or AIDS (acquired immunodeficiency syndrome). ? Cancer. ? Conditions in which the  body's disease-fighting system attacks normal tissues (autoimmune diseases). What are the signs or symptoms? Symptoms of this condition vary depending on the cause and may include:  Anemia.  Weakness.  Shortness of breath.  Unusual bruising and bleeding.  Frequent infections.  Fatigue.  Fever.  Pale skin.  Bone pain.  Night sweats.  Weight loss.  Headache.  Dizziness.  Feeling unusually cold. How is this diagnosed? This condition may be diagnosed based on:  Your symptoms.  Your medical history.  A physical exam.  Tests. These may include: ? Removal of a sample of bone marrow to be examined under a microscope (biopsy). This is done by inserting a needle into a bone to remove fluid and cells (aspiration). ? A complete blood count (CBC). This is a group of tests that measures characteristics of WBCs, RBCs, and platelets. ? A peripheral blood smear. This test examines your blood under a microscope to provide information about drugs and diseases that affect RBCs, WBCs, and platelets. ? Reticulocyte count. This is a test that measures the amount of new or immature RBCs (reticulocytes) that are made by your bone marrow. ? Imaging studies of your spleen or liver, such as X-rays. ? A test to measure your vitamin B12 level. ? Tests for viruses. How is this treated? Treatment for this condition depends on the cause. Treatment may include:  Immunosuppressant medicines.  Antibiotic medicine.  Vitamin B12. This may be given as a treatment for megaloblastic anemia.  Medicines that help the bone marrow make blood cells (bone marrow stimulating drugs).  A bone marrow transplant.  Receiving donated blood through an IV (  blood transfusion).  A procedure to remove your spleen (splenectomy). This may be done as a treatment for hypersplenism. Follow these instructions at home: Caring for your body      Wash your hands often with soap and water. If soap and water are not  available, use hand sanitizer.  Brush your teeth twice a day, and floss at least once a day. It is recommended that you visit the dentist every 6 months.  Stay up to date on your vaccinations, including a yearly (annual) flu shot. Ask your health care provider which vaccines you should get. These may include a pneumonia vaccine. Medicines  Take over-the-counter and prescription medicines only as told by your health care provider.  If you were prescribed an antibiotic medicine, take it as told by your health care provider. Do not stop taking the antibiotic even if you start to feel better. Lifestyle  Do not participate in contact sports or dangerous activities. Ask your health care provider what activities are safe for you.  During cold and flu season, avoid crowded places and avoid contact with people who are sick. Flu season is typically between the months of October and May. General instructions  Work with your health care provider to manage your condition and educate yourself about your condition.  Follow food safety recommendations as told by your health care provider.  Keep all follow-up visits as told by your health care provider. This is important. Contact a health care provider if you:  Have a fever.  Bruise or bleed easily.  Are dizzy.  Feel unusually weak or tired. Get help right away if you have:  Bleeding that does not stop.  Wheezing or shortness of breath.  Chest pain. These symptoms may represent a serious problem that is an emergency. Do not wait to see if the symptoms will go away. Get medical help right away. Call your local emergency services (911 in the [place] Do not drive yourself to the hospital. Summary  Pancytopenia is a condition in which a person has an abnormally low amount of red blood cells, white blood cells, and platelets.  There are many possible causes of this condition.  Treatment for this condition depends on the cause.  Stay up to  date on your vaccinations.  Do not participate in contact sports or dangerous activities. Ask your health care provider what activities are safe for you. This information is not intended to replace advice given to you by your health care provider. Make sure you discuss any questions you have with your health care provider. Document Released: 05/21/2015 Document Revised: 01/28/2018 Document Reviewed: 01/28/2018 Elsevier Patient Education  2020 [employer].

## 2018-12-17 NOTE — Telephone Encounter (Signed)
I was out of the office yesterday   Please call and try to schedule in person visit today

## 2018-12-17 NOTE — Progress Notes (Signed)
Chief Complaint  Patient presents with  . Follow-up   Pt c/o low blood cts noted with no known cause these were pre op labs   12/12/2018 12:42 WBC: 2.9 (L) RBC: 4.09 (L) Hemoglobin: 13.3 HCT: 41.5 MCV: 101.5 (H) MCH: 32.5 MCHC: 32.0 RDW: 14.6 Platelets: 64 (L) nRBC: 1.0 (H)   Epigastric abdominal pain chronic and he had 2 loose watery stools today but none prior  CT ab/pelvis 10/10/2018  1. No CT findings of the abdomen or pelvis to explain right greater than left abdominal pain. No evidence of urinary tract calculus or hydronephrosis.  2. Chronic, incidental, and postoperative findings as detailed above.   Review of Systems  Constitutional: Negative for weight loss.  HENT: Negative for hearing loss.   Eyes: Negative for blurred vision.  Respiratory: Negative for shortness of breath.   Cardiovascular: Negative for chest pain.  Gastrointestinal: Positive for abdominal pain and diarrhea.  Musculoskeletal: Negative for falls.  Skin: Negative for rash.       Skin appears improved w/o significant rash    Neurological: Negative for headaches.  Psychiatric/Behavioral: Negative for depression.   Past Medical History:  Diagnosis Date  . Arthritis   . Asthma   . CAD (coronary artery disease)   . COPD (chronic obstructive pulmonary disease) ([place])   . Depression   . Diabetes mellitus   . Dyspnea    with exertion  . GERD (gastroesophageal reflux disease)   . Heart disease    cad with 3 stents   . Hypertension   . MI (myocardial infarction) ([place])    3 stents  . Migraines    migraines  . Psoriasis    joint pain in knees left hand   . Sleep apnea    cpap   Past Surgical History:  Procedure Laterality Date  . APPENDECTOMY    . CATARACT EXTRACTION Bilateral    inserted lens b/l eyes right 03/12/17, left 05/28/17   . CHOLECYSTECTOMY    . CORONARY ANGIOPLASTY WITH STENT PLACEMENT     LAD, LCX-OM, ? 3rd location  . EYE SURGERY     cataract b/l   . HEMORRHOID SURGERY     . hemorrhoid surgery      x 2   . KNEE SURGERY     right knee arthroscopy, partial synovectecomy 04/21/15 and meniscectomy/chondroplasty   . LEFT HEART CATH AND CORONARY ANGIOGRAPHY Left 01/30/2018   Procedure: LEFT HEART CATH AND CORONARY ANGIOGRAPHY;  Surgeon: Isaias Cowman, MD;  Location: [facility];  Service: Cardiovascular;  Laterality: Left;  . right knee surgery     ? type of procedure   . STENT PLACEMENT VASCULAR ([place] HX)    . TONSILLECTOMY    . TONSILLECTOMY AND ADENOIDECTOMY     Family History  Problem Relation Age of Onset  . Cancer Mother   . Heart disease Father   . Stroke Father   . Heart disease Brother    Social History   Socioeconomic History  . Marital status: Single    Spouse name: Not on file  . Number of children: 0  . Years of education: Not on file  . Highest education level: Not on file  Occupational History  . Not on file  Social Needs  . Financial resource strain: Not on file  . Food insecurity    Worry: Not on file    Inability: Not on file  . Transportation needs    Medical: Not on file    Non-medical:  Not on file  Tobacco Use  . Smoking status: Current Some Day Smoker    Years: 5.00    Types: Cigarettes  . Smokeless tobacco: Never Used  . Tobacco comment: 3-4 cig every couple days  Substance and Sexual Activity  . Alcohol use: No    Frequency: Never  . Drug use: No  . Sexual activity: Not on file  Lifestyle  . Physical activity    Days per week: Not on file    Minutes per session: Not on file  . Stress: Not on file  Relationships  . Social Herbalist on phone: Not on file    Gets together: Not on file    Attends religious service: Not on file    Active member of club or organization: Not on file    Attends meetings of clubs or organizations: Not on file    Relationship status: Not on file  . Intimate partner violence    Fear of current or ex partner: Not on file    Emotionally abused: Not on file     Physically abused: Not on file    Forced sexual activity: Not on file  Other Topics Concern  . Not on file  Social History Narrative   Former smoker quit 2013/2014 used to smoke<1 ppd x 2-3 years    No kids   Single not sexually active    Unable to read/write    Has dogs and cats at home    No guns   Wears seat belt    Current Meds  Medication Sig  . acetaminophen (TYLENOL) 500 MG tablet Take 1 tablet (500 mg total) by mouth every 6 (six) hours as needed.  Marland Kitchen albuterol (PROVENTIL HFA;VENTOLIN HFA) 108 (90 Base) MCG/ACT inhaler Inhale 2 puffs into the lungs every 4 (four) hours as needed. For shortness of breath  . amLODipine (NORVASC) 5 MG tablet Take 5 mg by mouth daily.  Marland Kitchen aspirin 81 MG EC tablet Take 81 mg by mouth daily.   Marland Kitchen atorvastatin (LIPITOR) 20 MG tablet Take 1 tablet (20 mg total) by mouth daily at 6 PM.  . budesonide-formoterol (SYMBICORT) 160-4.5 MCG/ACT inhaler Inhale 2 puffs into the lungs 2 (two) times daily. Rinse mouth  . cetirizine (ZYRTEC) 10 MG tablet Take 10 mg by mouth daily.  . Cholecalciferol 50000 units capsule Take 1 capsule (50,000 Units total) by mouth once a week.  . clobetasol ointment (TEMOVATE) 0.73 % Apply 1 application topically 2 (two) times daily. Right ear as needed not face or private  . clotrimazole (LOTRIMIN) 1 % cream Apply 1 application topically 2 (two) times daily.  . diclofenac (FLECTOR) 1.3 % PTCH Place 1 patch onto the skin 2 (two) times daily.  Marland Kitchen dicyclomine (BENTYL) 10 MG capsule Take 1 capsule (10 mg total) by mouth 4 (four) times daily -  before meals and at bedtime.  . DULoxetine (CYMBALTA) 60 MG capsule Take 1 capsule (60 mg total) by mouth daily.  . fluticasone (FLONASE) 50 MCG/ACT nasal spray Place 2 sprays into both nostrils daily.  . furosemide (LASIX) 40 MG tablet Take 1 tablet (40 mg total) by mouth 2 (two) times daily. In am and at lunch  . glucose blood test strip 1 each by Other route 3 (three) times daily as needed. DX-  E11.9 Diabetes Mellitus type 2.  . hydrocortisone 2.5 % cream Apply topically 2 (two) times daily. Left face left of eye  . hydrOXYzine (ATARAX/VISTARIL) 25 MG tablet  Take 1 tablet (25 mg total) by mouth 3 (three) times daily as needed.  . isosorbide mononitrate (IMDUR) 30 MG 24 hr tablet Take 1 tablet (30 mg total) by mouth daily.  Marland Kitchen ketorolac (ACULAR) 0.5 % ophthalmic solution   . meloxicam (MOBIC) 7.5 MG tablet Take 1 tablet (7.5 mg total) by mouth 2 (two) times daily as needed for pain.  . metFORMIN (GLUCOPHAGE) 500 MG tablet Take 1 tablet (500 mg total) by mouth daily with breakfast.  . metoprolol tartrate (LOPRESSOR) 25 MG tablet Take 25 mg by mouth 2 (two) times daily.  . montelukast (SINGULAIR) 10 MG tablet Take 10 mg by mouth at bedtime.  . mupirocin ointment (BACTROBAN) 2 % Apply 1 application topically 2 (two) times daily. Any open sores  . nitroGLYCERIN (NITROSTAT) 0.4 MG SL tablet Place 0.4 mg under the tongue every 5 (five) minutes as needed. For chest pain  . ondansetron (ZOFRAN) 4 MG tablet Take 1 tablet (4 mg total) by mouth every 8 (eight) hours as needed for nausea or vomiting.  Marland Kitchen oxyCODONE-acetaminophen (PERCOCET) 5-325 MG tablet Take 1 tablet by mouth 2 (two) times daily as needed for severe pain.  . pantoprazole (PROTONIX) 20 MG tablet TAKE 1 TABLET EVERY DAY  . potassium chloride SA (K-DUR,KLOR-CON) 20 MEQ tablet Take 1 tablet (20 mEq total) by mouth daily.  . prednisoLONE acetate (PRED FORTE) 1 % ophthalmic suspension   . Secukinumab (COSENTYX 300 DOSE [place]) Inject into the skin every 30 (thirty) days.  Marland Kitchen spironolactone (ALDACTONE) 25 MG tablet Take 1 tablet (25 mg total) by mouth daily. In am for blood pressure  . sucralfate (CARAFATE) 1 g tablet Take 1 tablet (1 g total) by mouth 4 (four) times daily -  with meals and at bedtime.  . sulfamethoxazole-trimethoprim (BACTRIM DS) 800-160 MG tablet Take 1 tablet by mouth 2 (two) times daily.  Marland Kitchen triamcinolone cream (KENALOG) 0.1  % Apply 1 application topically 2 (two) times daily. Not face, groin, underarms   No Known Allergies Recent Results (from the past 2160 hour(s))  Lipase, blood     Status: None   Collection Time: 10/10/18  1:14 PM  Result Value Ref Range   Lipase 36 11 - 51 U/L    Comment: Performed at [facility], [address] [place], [place] [address]  Urinalysis, Routine w reflex microscopic     Status: Abnormal   Collection Time: 10/10/18  1:14 PM  Result Value Ref Range   Color, Urine YELLOW (A) YELLOW   APPearance CLOUDY (A) CLEAR   Specific Gravity, Urine 1.034 (H) 1.005 - 1.030   pH 5.0 5.0 - 8.0   Glucose, UA NEGATIVE NEGATIVE mg/dL   Hgb urine dipstick NEGATIVE NEGATIVE   Bilirubin Urine NEGATIVE NEGATIVE   Ketones, ur NEGATIVE NEGATIVE mg/dL   Protein, ur 30 (A) NEGATIVE mg/dL   Nitrite NEGATIVE NEGATIVE   Leukocytes,Ua NEGATIVE NEGATIVE   RBC / HPF 0-5 0 - 5 RBC/hpf   WBC, UA 0-5 0 - 5 WBC/hpf   Bacteria, UA NONE SEEN NONE SEEN   Squamous Epithelial / LPF 0-5 0 - 5   Mucus PRESENT     Comment: Performed at [facility], [address] [place], [place] [address]  CBC w/Diff     Status: Abnormal   Collection Time: 10/10/18  1:14 PM  Result Value Ref Range   WBC 5.9 4.0 - 10.5 K/uL   RBC 4.49 4.22 - 5.81 MIL/uL   Hemoglobin 14.6 13.0 -  17.0 g/dL   HCT 44.6 39.0 - 52.0 %   MCV 99.3 80.0 - 100.0 fL   MCH 32.5 26.0 - 34.0 pg   MCHC 32.7 30.0 - 36.0 g/dL   RDW 13.7 11.5 - 15.5 %   Platelets 130 (L) 150 - 400 K/uL    Comment: Immature Platelet Fraction may be clinically indicated, consider ordering this additional test [id number]    nRBC 0.0 0.0 - 0.2 %   Neutrophils Relative % 58 %   Neutro Abs 3.3 1.7 - 7.7 K/uL   Lymphocytes Relative 30 %   Lymphs Abs 1.8 0.7 - 4.0 K/uL   Monocytes Relative 9 %   Monocytes Absolute 0.5 0.1 - 1.0 K/uL   Eosinophils Relative 2 %   Eosinophils Absolute 0.1 0.0 - 0.5 K/uL   Basophils Relative 1 %   Basophils Absolute  0.1 0.0 - 0.1 K/uL   Immature Granulocytes 0 %   Abs Immature Granulocytes 0.02 0.00 - 0.07 K/uL    Comment: Performed at [facility], [address] [place], [place] [address]  Comprehensive metabolic panel     Status: Abnormal   Collection Time: 10/10/18  1:14 PM  Result Value Ref Range   Sodium 136 135 - 145 mmol/L   Potassium 3.7 3.5 - 5.1 mmol/L   Chloride 94 (L) 98 - 111 mmol/L   CO2 31 22 - 32 mmol/L   Glucose, Bld 78 70 - 99 mg/dL   BUN 18 8 - 23 mg/dL   Creatinine, Ser 1.05 0.61 - 1.24 mg/dL   Calcium 8.4 (L) 8.9 - 10.3 mg/dL   Total Protein 6.9 6.5 - 8.1 g/dL   Albumin 3.8 3.5 - 5.0 g/dL   AST 15 15 - 41 U/L   ALT 19 0 - 44 U/L   Alkaline Phosphatase 70 38 - 126 U/L   Total Bilirubin 0.5 0.3 - 1.2 mg/dL   GFR calc non Af Amer >60 >60 mL/min   GFR calc Af Amer >60 >60 mL/min   Anion gap 11 5 - 15    Comment: Performed at [facility], [address] [place], [place] [address]  CBC     Status: Abnormal   Collection Time: 12/12/18 12:42 PM  Result Value Ref Range   WBC 2.9 (L) 4.0 - 10.5 K/uL   RBC 4.09 (L) 4.22 - 5.81 MIL/uL   Hemoglobin 13.3 13.0 - 17.0 g/dL   HCT 41.5 39.0 - 52.0 %   MCV 101.5 (H) 80.0 - 100.0 fL   MCH 32.5 26.0 - 34.0 pg   MCHC 32.0 30.0 - 36.0 g/dL   RDW 14.6 11.5 - 15.5 %   Platelets 64 (L) 150 - 400 K/uL    Comment: Immature Platelet Fraction may be clinically indicated, consider ordering this additional test [id number]    nRBC 1.0 (H) 0.0 - 0.2 %    Comment: Performed at [facility], [address] [place], [place] [address]  Basic metabolic panel     Status: Abnormal   Collection Time: 12/12/18 12:42 PM  Result Value Ref Range   Sodium 139 135 - 145 mmol/L   Potassium 3.7 3.5 - 5.1 mmol/L   Chloride 98 98 - 111 mmol/L   CO2 34 (H) 22 - 32 mmol/L   Glucose, Bld 116 (H) 70 - 99 mg/dL   BUN 10 8 - 23 mg/dL   Creatinine, Ser 0.70 0.61 - 1.24 mg/dL   Calcium 8.8 (L) 8.9 - 10.3  mg/dL   GFR calc non Af  Amer >60 >60 mL/min   GFR calc Af Amer >60 >60 mL/min   Anion gap 7 5 - 15    Comment: Performed at [facility], [address] [place], [place] [address]  Protime-INR     Status: None   Collection Time: 12/12/18 12:42 PM  Result Value Ref Range   Prothrombin Time 12.3 11.4 - 15.2 seconds   INR 0.9 0.8 - 1.2    Comment: (NOTE) INR goal varies based on device and disease states. Performed at [facility], [address] [place], [place] [address]   APTT     Status: None   Collection Time: 12/12/18 12:42 PM  Result Value Ref Range   aPTT 31 24 - 36 seconds    Comment: Performed at [facility], [address] [place], [place] [address]  Urinalysis, Routine w reflex microscopic     Status: Abnormal   Collection Time: 12/12/18 12:42 PM  Result Value Ref Range   Color, Urine YELLOW (A) YELLOW   APPearance CLEAR (A) CLEAR   Specific Gravity, Urine 1.030 1.005 - 1.030   pH 5.0 5.0 - 8.0   Glucose, UA NEGATIVE NEGATIVE mg/dL   Hgb urine dipstick NEGATIVE NEGATIVE   Bilirubin Urine NEGATIVE NEGATIVE   Ketones, ur NEGATIVE NEGATIVE mg/dL   Protein, ur NEGATIVE NEGATIVE mg/dL   Nitrite NEGATIVE NEGATIVE   Leukocytes,Ua NEGATIVE NEGATIVE    Comment: Performed at [facility], [address], [place], [place] [address]  Urine culture     Status: Abnormal   Collection Time: 12/12/18 12:42 PM   Specimen: Urine, Random  Result Value Ref Range   Specimen Description      URINE, RANDOM Performed at [facility], [address], [place], [place] [address]    Special Requests      NONE Performed at [facility], [address] [place], [place] [address]    Culture (A)     <10,000 COLONIES/mL >=100,000 COLONIES/mL Performed at [place] [address] [place], [place] [address]    Report Status 12/13/2018 FINAL   Type and screen [place]     Status: None   Collection Time: 12/12/18 12:42  PM  Result Value Ref Range   ABO/RH(D) A POS    Antibody Screen NEG    Sample Expiration 12/26/2018,2359    Extend sample reason      NO TRANSFUSIONS OR PREGNANCY IN THE PAST 3 MONTHS Performed at [facility], [address] [place], [place] [address]   Surgical pcr screen     Status: None   Collection Time: 12/12/18 12:42 PM   Specimen: Nasal Mucosa; Nasal Swab  Result Value Ref Range   MRSA, PCR NEGATIVE NEGATIVE   Staphylococcus aureus NEGATIVE NEGATIVE    Comment: (NOTE) The Xpert SA Assay (FDA approved for NASAL specimens in patients 71 years of age and older), is one component of a comprehensive surveillance program. It is not intended to diagnose infection nor to guide or monitor treatment. Performed at [facility], [address] [place], [place] [address]   Sedimentation rate     Status: None   Collection Time: 12/12/18 12:42 PM  Result Value Ref Range   Sed Rate 19 0 - 20 mm/hr    Comment: Performed at [facility], [place], [place] [address]  SARS CORONAVIRUS 2 Nasal Swab Aptima Multi Swab  Status: None   Collection Time: 12/12/18  1:31 PM   Specimen: Aptima Multi Swab; Nasal Swab  Result Value Ref Range   SARS Coronavirus 2 NEGATIVE NEGATIVE    Comment: (NOTE) SARS-CoV-2 target nucleic acids are NOT DETECTED. The SARS-CoV-2 RNA is generally detectable in upper and lower respiratory specimens during the acute phase of infection. Negative results do not preclude SARS-CoV-2 infection, do not rule out co-infections with other pathogens, and should not be used as the sole basis for treatment or other patient management decisions. Negative results must be combined with clinical observations, patient history, and epidemiological information. The expected result is Negative. Fact Sheet for Patients: SugarRoll.be Fact Sheet for Healthcare  Providers: https://www.woods-mathews.com/ This test is not yet approved or cleared by the [place] FDA and  has been authorized for detection and/or diagnosis of SARS-CoV-2 by FDA under an Emergency Use Authorization (EUA). This EUA will remain  in effect (meaning this test can be used) for the duration of the COVID-19 declaration under Section 56 4(b)(1) of the Act, 21 U.S.C. section 360bbb-3(b)(1), unless the authorization is terminated or revoked sooner. Performed at [facility], [place] [address], [place], [place] [address]    Objective  There is no height or weight on file to calculate BMI. Wt Readings from Last 3 Encounters:  12/12/18 230 lb 9.6 oz (104.6 kg)  10/08/18 235 lb (106.6 kg)  09/19/18 235 lb (106.6 kg)   Temp Readings from Last 3 Encounters:  10/08/18 98.8 F (37.1 C) (Oral)  09/19/18 98 F (36.7 C) (Oral)  08/05/18 98.4 F (36.9 C)   BP Readings from Last 3 Encounters:  12/12/18 133/64  10/08/18 (!) 149/89  09/19/18 134/67   Pulse Readings from Last 3 Encounters:  12/12/18 80  10/08/18 85  09/19/18 90    Physical Exam Vitals signs and nursing note reviewed.  Constitutional:      Appearance: Normal appearance. He is well-developed and well-groomed. He is obese.  HENT:     Head: Normocephalic and atraumatic.     Nose: Nose normal.     Mouth/Throat:     Mouth: Mucous membranes are moist.     Pharynx: Oropharynx is clear.  Cardiovascular:     Rate and Rhythm: Normal rate and regular rhythm.     Heart sounds: Normal heart sounds.  Pulmonary:     Effort: Pulmonary effort is normal.     Breath sounds: Normal breath sounds.  Abdominal:     General: Bowel sounds are normal. There is no distension.     Palpations: There is no mass.     Tenderness: There is abdominal tenderness in the epigastric area.  Skin:    General: Skin is warm and dry.  Neurological:     General: No focal deficit present.     Mental Status: He is alert  and oriented to person, place, and time. Mental status is at baseline.     Gait: Gait normal.  Psychiatric:        Attention and Perception: Attention and perception normal.        Mood and Affect: Mood and affect normal.        Speech: Speech normal.        Behavior: Behavior normal. Behavior is cooperative.        Thought Content: Thought content normal.        Cognition and Memory: Cognition and memory normal.        Judgment: Judgment normal.  Assessment   1. Leukocytopenia/thrombocytopenia (denies etoh use or new meds) ? Etiology  2. Epigastric ab pain ?etiology r/o H pylori, ? If related metformin. CT ab/pelvis no clear etiology  3. HM Plan   1. CBC with diff with peripheral smear PT/PTT LDH CMET, Uric acid  Folic acid  P32 normal previously No splenomegaly noted CT 10/10/2018  hIV h pylori  Denies etoh  Urine culture  Lab not able to do blood culture today if needed in future will have to go to hospital or [facility]  CT chest 07/22/18 c/w scarring vs mild lingular pneumonia/bronchitis  -no signs of cough/wheezing today consider repeat CXR in future if sx's continue  ABO type A+  Referred H/o Dr. Tasia Catchings further w/u  2. Check h pylori  [facility] for further w/u abdominal pain I.e EGD/colonoscopy Consider stool testing in future if loose stools continue esp with h/o abx use  3.  Flu shot had 02/22/18 Tdap had 2/12/17or 12/07/15 per prior PCP notes Adacelalso had 08/18/14, 05/13/15, 12/07/15 pna 23 had 09/19/12  Consider hep B vaccine   Check with PCP other vaccines I.e shingrix and colonoscopy, labs,notes I.e cardiology notes if had  hep C (negative) 11/20/16 ImmuneMMR, and vitamin D (low) PSA had 11/15/17 normal -will add on PSA  Former smoker quit 2013/2014 smoked <1 ppdx2-3 years  Urine had 09/24/17 normal HIV neg 09/15/14 Derm appt sch 06/04/2018 pt to call and get bx report of [facility] bxs today they attempted to call GI-Dr. Vicente Males pending EGD/colonoscopyappt 01/2019    Time spent 25 minutes  Provider: Dr. Olivia Mackie [facility]-Internal Medicine

## 2018-12-17 NOTE — Telephone Encounter (Signed)
Patient shceduled at 1600

## 2018-12-17 NOTE — Telephone Encounter (Signed)
Patient called back a little frustrated that he have not heard from Dr Olivia Mackie or her assistant. Can someone please follow up. Thank you Ph#  [phone]

## 2018-12-18 ENCOUNTER — Other Ambulatory Visit: Payer: Self-pay | Admitting: Internal Medicine

## 2018-12-18 ENCOUNTER — Telehealth: Payer: Self-pay

## 2018-12-18 DIAGNOSIS — D72819 Decreased white blood cell count, unspecified: Secondary | ICD-10-CM

## 2018-12-18 DIAGNOSIS — D696 Thrombocytopenia, unspecified: Secondary | ICD-10-CM

## 2018-12-18 LAB — TEST AUTHORIZATION

## 2018-12-18 LAB — HEPATIC FUNCTION PANEL
AG Ratio: 1.9 (calc) (ref 1.0–2.5)
ALT: 11 U/L (ref 9–46)
AST: 13 U/L (ref 10–35)
Albumin: 3.6 g/dL (ref 3.6–5.1)
Alkaline phosphatase (APISO): 46 U/L (ref 35–144)
Bilirubin, Direct: 0.1 mg/dL (ref 0.0–0.2)
Globulin: 1.9 g/dL (calc) (ref 1.9–3.7)
Indirect Bilirubin: 0.5 mg/dL (calc) (ref 0.2–1.2)
Total Bilirubin: 0.6 mg/dL (ref 0.2–1.2)
Total Protein: 5.5 g/dL — ABNORMAL LOW (ref 6.1–8.1)

## 2018-12-18 LAB — CBC WITH DIFFERENTIAL/PLATELET
Absolute Monocytes: 263 cells/uL (ref 200–950)
Basophils Absolute: 39 cells/uL (ref 0–200)
Basophils Relative: 1.4 %
Eosinophils Absolute: 50 cells/uL (ref 15–500)
Eosinophils Relative: 1.8 %
HCT: 41.3 % (ref 38.5–50.0)
Hemoglobin: 13.8 g/dL (ref 13.2–17.1)
Lymphs Abs: 1249 cells/uL (ref 850–3900)
MCH: 33.3 pg — ABNORMAL HIGH (ref 27.0–33.0)
MCHC: 33.4 g/dL (ref 32.0–36.0)
MCV: 99.8 fL (ref 80.0–100.0)
MPV: 11.8 fL (ref 7.5–12.5)
Monocytes Relative: 9.4 %
Neutro Abs: 1198 cells/uL — ABNORMAL LOW (ref 1500–7800)
Neutrophils Relative %: 42.8 %
Platelets: 55 10*3/uL — ABNORMAL LOW (ref 140–400)
RBC: 4.14 10*6/uL — ABNORMAL LOW (ref 4.20–5.80)
RDW: 14.7 % (ref 11.0–15.0)
Total Lymphocyte: 44.6 %
WBC: 2.8 10*3/uL — ABNORMAL LOW (ref 3.8–10.8)

## 2018-12-18 LAB — FOLATE: Folate: 10.5 ng/mL

## 2018-12-18 LAB — URIC ACID: Uric Acid, Serum: 4.8 mg/dL (ref 4.0–8.0)

## 2018-12-18 LAB — PATHOLOGIST SMEAR REVIEW

## 2018-12-18 LAB — PSA: PSA: 0.2 ng/mL (ref <=4.0)

## 2018-12-18 LAB — LACTATE DEHYDROGENASE: LDH: 215 U/L (ref 120–250)

## 2018-12-18 LAB — HIV ANTIBODY (ROUTINE TESTING W REFLEX): HIV 1&2 Ab, 4th Generation: NONREACTIVE

## 2018-12-18 NOTE — Addendum Note (Signed)
Addended by: Leeanne Rio on: 12/18/2018 08:11 AM   Modules accepted: Orders

## 2018-12-18 NOTE — Addendum Note (Signed)
Addended by: Leeanne Rio on: 12/18/2018 08:14 AM   Modules accepted: Orders

## 2018-12-18 NOTE — Telephone Encounter (Signed)
Copied from [place] [phone]. Topic: General - Other >> Dec 18, 2018  8:59 AM Alanda Slim E wrote: Reason for CRM: Pt surgery was cancelled and Pt needs PCP clearance or a hematology referral. Pt's niece wants to speak with nurse or Dr. Olivia Mackie to coordinate.  Her name is Anderson Malta cb# [phone] and leave a message if she doesn't answer. / please advise

## 2018-12-18 NOTE — Telephone Encounter (Signed)
Duplicate message. closing

## 2018-12-19 ENCOUNTER — Other Ambulatory Visit: Payer: Self-pay

## 2018-12-19 ENCOUNTER — Encounter: Payer: Self-pay | Admitting: Oncology

## 2018-12-19 ENCOUNTER — Inpatient Hospital Stay: Payer: Medicare HMO

## 2018-12-19 ENCOUNTER — Inpatient Hospital Stay: Payer: Medicare HMO | Attending: Oncology | Admitting: Oncology

## 2018-12-19 VITALS — BP 131/74 | HR 88 | Temp 97.3°F | Resp 18 | Ht 71.85 in | Wt 230.5 lb

## 2018-12-19 DIAGNOSIS — Z7982 Long term (current) use of aspirin: Secondary | ICD-10-CM | POA: Diagnosis not present

## 2018-12-19 DIAGNOSIS — R5382 Chronic fatigue, unspecified: Secondary | ICD-10-CM | POA: Diagnosis not present

## 2018-12-19 DIAGNOSIS — I10 Essential (primary) hypertension: Secondary | ICD-10-CM | POA: Insufficient documentation

## 2018-12-19 DIAGNOSIS — Z79899 Other long term (current) drug therapy: Secondary | ICD-10-CM | POA: Diagnosis not present

## 2018-12-19 DIAGNOSIS — Z7951 Long term (current) use of inhaled steroids: Secondary | ICD-10-CM | POA: Diagnosis not present

## 2018-12-19 DIAGNOSIS — J449 Chronic obstructive pulmonary disease, unspecified: Secondary | ICD-10-CM

## 2018-12-19 DIAGNOSIS — E1136 Type 2 diabetes mellitus with diabetic cataract: Secondary | ICD-10-CM | POA: Insufficient documentation

## 2018-12-19 DIAGNOSIS — E785 Hyperlipidemia, unspecified: Secondary | ICD-10-CM | POA: Diagnosis not present

## 2018-12-19 DIAGNOSIS — Z87891 Personal history of nicotine dependence: Secondary | ICD-10-CM | POA: Diagnosis not present

## 2018-12-19 DIAGNOSIS — I252 Old myocardial infarction: Secondary | ICD-10-CM | POA: Diagnosis not present

## 2018-12-19 DIAGNOSIS — Z791 Long term (current) use of non-steroidal anti-inflammatories (NSAID): Secondary | ICD-10-CM | POA: Insufficient documentation

## 2018-12-19 DIAGNOSIS — Z7984 Long term (current) use of oral hypoglycemic drugs: Secondary | ICD-10-CM | POA: Insufficient documentation

## 2018-12-19 DIAGNOSIS — D72819 Decreased white blood cell count, unspecified: Secondary | ICD-10-CM | POA: Diagnosis not present

## 2018-12-19 DIAGNOSIS — K219 Gastro-esophageal reflux disease without esophagitis: Secondary | ICD-10-CM | POA: Diagnosis not present

## 2018-12-19 DIAGNOSIS — D709 Neutropenia, unspecified: Secondary | ICD-10-CM

## 2018-12-19 DIAGNOSIS — R5383 Other fatigue: Secondary | ICD-10-CM

## 2018-12-19 DIAGNOSIS — D696 Thrombocytopenia, unspecified: Secondary | ICD-10-CM | POA: Insufficient documentation

## 2018-12-19 LAB — URINE CULTURE
MICRO NUMBER:: 758925
SPECIMEN QUALITY:: ADEQUATE

## 2018-12-19 NOTE — Progress Notes (Signed)
Hematology/Oncology Consult note [facility] [phone] [phone]   Patient Care Team: McLean-Scocuzza, Nino Glow, MD as PCP - General (Internal Medicine) Leona Singleton, RN as [facility]  REFERRING PROVIDER: McLean-Scocuzza, Olivia Mackie *  CHIEF COMPLAINTS/REASON FOR VISIT:  Evaluation of leukopenia and thrombocytopenia  HISTORY OF PRESENTING ILLNESS:   Nicholas Reyes is a  63 y.o.  male with PMH listed below was seen in consultation at the request of  McLean-Scocuzza, Olivia Mackie *  for evaluation of leukopenia and thrombocytopenia Patient recently had blood work done at the primary care provider's office. 12/12/2018 CBC showed WBC 2.9, platelet count 64,000. 12/17/2018, WBC 2.8, predominantly neutropenia ANC 1.19 platelet count 55,000. Normal LDH, normal folic acid and H82 level, normal PSA normal uric acid level. Negative HIV negative  Path review showed a few lymphocytes.  Reactive.  No immature cells are identified.  Favor reactive process.  Thrombocytopenia no platelet clumps identified. 11/15/2017, hepatitis C antibody negative, hepatitis B surface antigen negative.  Patient is a poor historian.  He has no complaints today.  Denies fever, chills, unintentional weight loss, night sweating.  Chronic fatigue and shortness of breath. Appetite is good.  Review of Systems  Constitutional: Positive for fatigue. Negative for appetite change, chills and fever.  HENT:   Negative for hearing loss and voice change.   Eyes: Negative for eye problems.  Respiratory: Negative for chest tightness and cough.   Cardiovascular: Negative for chest pain.  Gastrointestinal: Negative for abdominal distention, abdominal pain and blood in stool.  Endocrine: Negative for hot flashes.  Genitourinary: Negative for difficulty urinating and frequency.   Musculoskeletal: Negative for arthralgias.  Skin: Negative for itching and rash.    Neurological: Negative for extremity weakness.  Hematological: Negative for adenopathy.  Psychiatric/Behavioral: Negative for confusion.    MEDICAL HISTORY:  Past Medical History:  Diagnosis Date   Arthritis    Asthma    CAD (coronary artery disease)    COPD (chronic obstructive pulmonary disease) (HCC)    Depression    Diabetes mellitus    Dyspnea    with exertion   GERD (gastroesophageal reflux disease)    Heart disease    cad with 3 stents    Hypertension    MI (myocardial infarction) ([place])    3 stents   Migraines    migraines   Psoriasis    joint pain in knees left hand    Sleep apnea    cpap    SURGICAL HISTORY: Past Surgical History:  Procedure Laterality Date   APPENDECTOMY     CATARACT EXTRACTION Bilateral    inserted lens b/l eyes right 03/12/17, left 05/28/17    CHOLECYSTECTOMY     CORONARY ANGIOPLASTY WITH STENT PLACEMENT     LAD, LCX-OM, ? 3rd location   EYE SURGERY     cataract b/l    HEMORRHOID SURGERY     hemorrhoid surgery      x 2    KNEE SURGERY     right knee arthroscopy, partial synovectecomy 04/21/15 and meniscectomy/chondroplasty    LEFT HEART CATH AND CORONARY ANGIOGRAPHY Left 01/30/2018   Procedure: LEFT HEART CATH AND CORONARY ANGIOGRAPHY;  Surgeon: Isaias Cowman, MD;  Location: [place];  Service: Cardiovascular;  Laterality: Left;   right knee surgery     ? type of procedure    STENT PLACEMENT VASCULAR ([facility] HX)     TONSILLECTOMY     TONSILLECTOMY AND ADENOIDECTOMY  SOCIAL HISTORY: Social History   Socioeconomic History   Marital status: Single    Spouse name: Not on file   Number of children: 0   Years of education: Not on file   Highest education level: Not on file  Occupational History   Not on file  Social Needs   Financial resource strain: Not on file   Food insecurity    Worry: Not on file    Inability: Not on file   Transportation needs    Medical: Not on  file    Non-medical: Not on file  Tobacco Use   Smoking status: Former Smoker    Years: 5.00    Types: Cigarettes   Smokeless tobacco: Former Systems developer    Quit date: 12/19/2015   Tobacco comment: 3-4 cig every couple days  Substance and Sexual Activity   Alcohol use: No    Frequency: Never   Drug use: No   Sexual activity: Not on file  Lifestyle   Physical activity    Days per week: Not on file    Minutes per session: Not on file   Stress: Not on file  Relationships   Social connections    Talks on phone: Not on file    Gets together: Not on file    Attends religious service: Not on file    Active member of club or organization: Not on file    Attends meetings of clubs or organizations: Not on file    Relationship status: Not on file   Intimate partner violence    Fear of current or ex partner: Not on file    Emotionally abused: Not on file    Physically abused: Not on file    Forced sexual activity: Not on file  Other Topics Concern   Not on file  Social History Narrative   Former smoker quit 2013/2014 used to smoke<1 ppd x 2-3 years    No kids   Single not sexually active    Unable to read/write    Has dogs and cats at home    No guns   Wears seat belt     FAMILY HISTORY: Family History  Problem Relation Age of Onset   Cancer Mother    Heart disease Father    Stroke Father    Heart disease Brother     ALLERGIES:  has No Known Allergies.  MEDICATIONS:  Current Outpatient Medications  Medication Sig Dispense Refill   acetaminophen (TYLENOL) 500 MG tablet Take 1 tablet (500 mg total) by mouth every 6 (six) hours as needed. 30 tablet 0   albuterol (PROVENTIL HFA;VENTOLIN HFA) 108 (90 Base) MCG/ACT inhaler Inhale 2 puffs into the lungs every 4 (four) hours as needed. For shortness of breath 3 Inhaler 4   amLODipine (NORVASC) 5 MG tablet Take 5 mg by mouth daily.     aspirin 81 MG EC tablet Take 81 mg by mouth daily.      atorvastatin (LIPITOR)  20 MG tablet Take 1 tablet (20 mg total) by mouth daily at 6 PM. 90 tablet 3   budesonide-formoterol (SYMBICORT) 160-4.5 MCG/ACT inhaler Inhale 2 puffs into the lungs 2 (two) times daily. Rinse mouth 1 Inhaler 12   cetirizine (ZYRTEC) 10 MG tablet Take 10 mg by mouth daily.     Cholecalciferol 50000 units capsule Take 1 capsule (50,000 Units total) by mouth once a week. 13 capsule 1   clobetasol ointment (TEMOVATE) 4.25 % Apply 1 application topically 2 (two) times daily.  Right ear as needed not face or private 45 g 0   clotrimazole (LOTRIMIN) 1 % cream Apply 1 application topically 2 (two) times daily. 60 g 11   diclofenac (FLECTOR) 1.3 % PTCH Place 1 patch onto the skin 2 (two) times daily. 60 patch 2   dicyclomine (BENTYL) 10 MG capsule Take 1 capsule (10 mg total) by mouth 4 (four) times daily -  before meals and at bedtime. 90 capsule 0   DULoxetine (CYMBALTA) 60 MG capsule Take 1 capsule (60 mg total) by mouth daily. 90 capsule 3   fluticasone (FLONASE) 50 MCG/ACT nasal spray Place 2 sprays into both nostrils daily. 16 g 6   furosemide (LASIX) 40 MG tablet Take 1 tablet (40 mg total) by mouth 2 (two) times daily. In am and at lunch 60 tablet 5   glucose blood test strip 1 each by Other route 3 (three) times daily as needed. DX- E11.9 Diabetes Mellitus type 2. 420 each 2   hydrocortisone 2.5 % cream Apply topically 2 (two) times daily. Left face left of eye 60 g 0   hydrOXYzine (ATARAX/VISTARIL) 25 MG tablet Take 1 tablet (25 mg total) by mouth 3 (three) times daily as needed. 30 tablet 0   isosorbide mononitrate (IMDUR) 30 MG 24 hr tablet Take 1 tablet (30 mg total) by mouth daily. 90 tablet 3   ketorolac (ACULAR) 0.5 % ophthalmic solution      meloxicam (MOBIC) 7.5 MG tablet Take 1 tablet (7.5 mg total) by mouth 2 (two) times daily as needed for pain. 60 tablet 2   metFORMIN (GLUCOPHAGE) 500 MG tablet Take 1 tablet (500 mg total) by mouth daily with breakfast. 90 tablet 3    metoprolol tartrate (LOPRESSOR) 25 MG tablet Take 25 mg by mouth 2 (two) times daily.     montelukast (SINGULAIR) 10 MG tablet Take 10 mg by mouth at bedtime.     mupirocin ointment (BACTROBAN) 2 % Apply 1 application topically 2 (two) times daily. Any open sores 60 g 2   nitroGLYCERIN (NITROSTAT) 0.4 MG SL tablet Place 0.4 mg under the tongue every 5 (five) minutes as needed. For chest pain     ondansetron (ZOFRAN) 4 MG tablet Take 1 tablet (4 mg total) by mouth every 8 (eight) hours as needed for nausea or vomiting. 20 tablet 0   pantoprazole (PROTONIX) 20 MG tablet TAKE 1 TABLET EVERY DAY 90 tablet 0   potassium chloride SA (K-DUR,KLOR-CON) 20 MEQ tablet Take 1 tablet (20 mEq total) by mouth daily. 90 tablet 1   prednisoLONE acetate (PRED FORTE) 1 % ophthalmic suspension      spironolactone (ALDACTONE) 25 MG tablet Take 1 tablet (25 mg total) by mouth daily. In am for blood pressure 90 tablet 3   triamcinolone cream (KENALOG) 0.1 % Apply 1 application topically 2 (two) times daily. Not face, groin, underarms 454 g 11   omeprazole (PRILOSEC) 40 MG capsule Take 1 capsule (40 mg total) by mouth daily. 90 capsule 3   oxyCODONE-acetaminophen (PERCOCET) 5-325 MG tablet Take 1 tablet by mouth 2 (two) times daily as needed for severe pain. (Patient not taking: Reported on 12/19/2018) 10 tablet 0   Secukinumab (COSENTYX 300 DOSE [place]) Inject into the skin every 30 (thirty) days.     sucralfate (CARAFATE) 1 g tablet Take 1 tablet (1 g total) by mouth 4 (four) times daily -  with meals and at bedtime. (Patient not taking: Reported on 12/19/2018) 120 tablet 0  sulfamethoxazole-trimethoprim (BACTRIM DS) 800-160 MG tablet Take 1 tablet by mouth 2 (two) times daily. (Patient not taking: Reported on 12/19/2018) 14 tablet 0   No current facility-administered medications for this visit.      PHYSICAL EXAMINATION: ECOG PERFORMANCE STATUS: 1 - Symptomatic but completely ambulatory Vitals:   12/19/18  0919  BP: 131/74  Pulse: 88  Resp: 18  Temp: (!) 97.3 F (36.3 C)   Filed Weights   12/19/18 0919  Weight: 230 lb 8 oz (104.6 kg)    Physical Exam Constitutional:      General: He is not in acute distress. HENT:     Head: Normocephalic and atraumatic.  Eyes:     General: No scleral icterus.    Pupils: Pupils are equal, round, and reactive to light.  Neck:     Musculoskeletal: Normal range of motion and neck supple.  Cardiovascular:     Rate and Rhythm: Normal rate and regular rhythm.     Heart sounds: Normal heart sounds.  Pulmonary:     Effort: Pulmonary effort is normal. No respiratory distress.     Breath sounds: No wheezing.     Comments: Decreased breath sound bilaterally. Abdominal:     General: Bowel sounds are normal. There is no distension.     Palpations: Abdomen is soft. There is no mass.     Tenderness: There is no abdominal tenderness.  Musculoskeletal: Normal range of motion.        General: No deformity.  Skin:    General: Skin is warm and dry.     Findings: No erythema or rash.  Neurological:     Mental Status: He is alert and oriented to person, place, and time.     Cranial Nerves: No cranial nerve deficit.     Coordination: Coordination normal.  Psychiatric:        Behavior: Behavior normal.        Thought Content: Thought content normal.     LABORATORY DATA:  I have reviewed the data as listed Lab Results  Component Value Date   WBC 2.8 (L) 12/17/2018   HGB 13.8 12/17/2018   HCT 41.3 12/17/2018   MCV 99.8 12/17/2018   PLT 55 (L) 12/17/2018   Recent Labs    07/27/18 0504 08/05/18 0223 10/10/18 1314 12/12/18 1242 12/17/18 1645  NA 140 138 136 139  --   K 4.3 4.0 3.7 3.7  --   CL 103 100 94* 98  --   CO2 30 30 31  34*  --   GLUCOSE 111* 136* 78 116*  --   BUN 15 16 18 10   --   CREATININE 0.87 0.69 1.05 0.70  --   CALCIUM 8.7* 8.2* 8.4* 8.8*  --   GFRNONAA >60 >60 >60 >60  --   GFRAA >60 >60 >60 >60  --   PROT 6.5 5.8* 6.9  --   5.5*  ALBUMIN 3.5 3.3* 3.8  --   --   AST 12* 18 15  --  13  ALT 12 16 19   --  11  ALKPHOS 50 51 70  --   --   BILITOT 0.4 0.5 0.5  --  0.6  BILIDIR  --   --   --   --  0.1  IBILI  --   --   --   --  0.5   Iron/TIBC/Ferritin/ %Sat No results found for: IRON, TIBC, FERRITIN, IRONPCTSAT    RADIOGRAPHIC STUDIES: I have personally reviewed the  radiological images as listed and agreed with the findings in the report. Ct Abdomen Pelvis W Contrast  Result Date: 10/10/2018 CLINICAL DATA:  Mid abdominal pain, right greater than left EXAM: CT ABDOMEN AND PELVIS WITH CONTRAST TECHNIQUE: Multidetector CT imaging of the abdomen and pelvis was performed using the standard protocol following bolus administration of intravenous contrast. CONTRAST:  147mL OMNIPAQUE IOHEXOL 300 MG/ML  SOLN COMPARISON:  04/09/2018 FINDINGS: Lower chest: No acute abnormality.  Coronary artery calcifications. Hepatobiliary: No focal liver abnormality is seen. Status post cholecystectomy. No biliary dilatation. Pancreas: Unremarkable. No pancreatic ductal dilatation or surrounding inflammatory changes. Spleen: Normal in size without focal abnormality. Adrenals/Urinary Tract: Adrenal glands are unremarkable. Kidneys are normal, without renal calculi, focal lesion, or hydronephrosis. Bladder is unremarkable. Stomach/Bowel: Stomach is within normal limits. Appendix is surgically absent. No evidence of bowel wall thickening, distention, or inflammatory changes. Sigmoid diverticulosis. Vascular/Lymphatic: Calcific atherosclerosis. No enlarged abdominal or pelvic lymph nodes. Reproductive: No mass or other abnormality. Other: No abdominal wall hernia or abnormality. No abdominopelvic ascites. Musculoskeletal: No acute or significant osseous findings. IMPRESSION: 1. No CT findings of the abdomen or pelvis to explain right greater than left abdominal pain. No evidence of urinary tract calculus or hydronephrosis. 2. Chronic, incidental, and  postoperative findings as detailed above. Electronically Signed   By: Eddie Candle M.D.   On: 10/10/2018 15:16     ASSESSMENT & PLAN:  1. Thrombocytopenia ([place])   2. Neutropenia, unspecified type [facility])    Previous labs were reviewed and discussed with patient. New onset of leukopenia and thrombocytopenia  For the work up of patient's thrombocytopenia, I recommend checking CBC, smear, TSH, reticulocyte panel, flow cytometry, immature platelet fraction.  # Patient follow-up with me in approximately 2-3 weeks to review the above results.  Patient left without getting blood work done today.  Scheduler attempted to call patient to reschedule lab encounter and patient did not answer phone.  Orders Placed This Encounter  Procedures   CBC with Differential/Platelet    Standing Status:   Future    Standing Expiration Date:   12/19/2019   Technologist smear review    Standing Status:   Future    Standing Expiration Date:   12/19/2019   Flow cytometry panel-leukemia/lymphoma work-up    Standing Status:   Future    Standing Expiration Date:   12/19/2019   Retic Panel    Standing Status:   Future    Standing Expiration Date:   12/19/2019   Immature Platelet Fraction    Standing Status:   Future    Standing Expiration Date:   12/19/2019   TSH    Standing Status:   Future    Standing Expiration Date:   12/19/2019    All questions were answered. The patient knows to call the clinic with any problems questions or concerns.  cc McLean-Scocuzza, Olivia Mackie *    Return of visit:  Thank you for this kind referral and the opportunity to participate in the care of this patient. A copy of today's note is routed to referring provider  Total face to face encounter time for this patient visit was 45 min. >50% of the time was  spent in counseling and coordination of care.    Earlie Server, MD, PhD Hematology Oncology [facility] at [facility] Pager- [phone] 12/19/2018

## 2018-12-19 NOTE — Progress Notes (Signed)
New patient here for evaluation of low WBC and platelets.  He is awaiting clearance for right knee surgery.  Has been cleared for surgery on cardiologist standpoint.

## 2018-12-23 ENCOUNTER — Other Ambulatory Visit: Payer: Self-pay

## 2018-12-23 ENCOUNTER — Inpatient Hospital Stay: Payer: Medicare HMO

## 2018-12-23 DIAGNOSIS — I252 Old myocardial infarction: Secondary | ICD-10-CM | POA: Diagnosis not present

## 2018-12-23 DIAGNOSIS — E1136 Type 2 diabetes mellitus with diabetic cataract: Secondary | ICD-10-CM | POA: Diagnosis not present

## 2018-12-23 DIAGNOSIS — E785 Hyperlipidemia, unspecified: Secondary | ICD-10-CM | POA: Diagnosis not present

## 2018-12-23 DIAGNOSIS — R5382 Chronic fatigue, unspecified: Secondary | ICD-10-CM | POA: Diagnosis not present

## 2018-12-23 DIAGNOSIS — D696 Thrombocytopenia, unspecified: Secondary | ICD-10-CM

## 2018-12-23 DIAGNOSIS — K219 Gastro-esophageal reflux disease without esophagitis: Secondary | ICD-10-CM | POA: Diagnosis not present

## 2018-12-23 DIAGNOSIS — I10 Essential (primary) hypertension: Secondary | ICD-10-CM | POA: Diagnosis not present

## 2018-12-23 DIAGNOSIS — J449 Chronic obstructive pulmonary disease, unspecified: Secondary | ICD-10-CM | POA: Diagnosis not present

## 2018-12-23 DIAGNOSIS — D709 Neutropenia, unspecified: Secondary | ICD-10-CM

## 2018-12-23 DIAGNOSIS — D72819 Decreased white blood cell count, unspecified: Secondary | ICD-10-CM | POA: Diagnosis not present

## 2018-12-23 LAB — CBC WITH DIFFERENTIAL/PLATELET
Band Neutrophils: 0 %
Basophils Absolute: 0 10*3/uL (ref 0.0–0.1)
Basophils Relative: 1 %
Blasts: 0 %
Eosinophils Absolute: 0.1 10*3/uL (ref 0.0–0.5)
Eosinophils Relative: 5 %
HCT: 37.7 % — ABNORMAL LOW (ref 39.0–52.0)
Hemoglobin: 12.1 g/dL — ABNORMAL LOW (ref 13.0–17.0)
Lymphocytes Relative: 53 %
Lymphs Abs: 1.4 10*3/uL (ref 0.7–4.0)
MCH: 32.8 pg (ref 26.0–34.0)
MCHC: 32.1 g/dL (ref 30.0–36.0)
MCV: 102.2 fL — ABNORMAL HIGH (ref 80.0–100.0)
Metamyelocytes Relative: 0 %
Monocytes Absolute: 0.1 10*3/uL (ref 0.1–1.0)
Monocytes Relative: 5 %
Myelocytes: 2 %
Neutro Abs: 0.8 10*3/uL — ABNORMAL LOW (ref 1.7–7.7)
Neutrophils Relative %: 29 %
Other: 5 %
Platelets: 47 10*3/uL — ABNORMAL LOW (ref 150–400)
Promyelocytes Relative: 0 %
RBC: 3.69 MIL/uL — ABNORMAL LOW (ref 4.22–5.81)
RDW: 14.9 % (ref 11.5–15.5)
Smear Review: DECREASED
WBC: 2.7 10*3/uL — ABNORMAL LOW (ref 4.0–10.5)
nRBC: 0 /100 WBC
nRBC: 1.5 % — ABNORMAL HIGH (ref 0.0–0.2)

## 2018-12-23 LAB — RETIC PANEL
Immature Retic Fract: 12.4 % (ref 2.3–15.9)
RBC.: 3.69 MIL/uL — ABNORMAL LOW (ref 4.22–5.81)
Retic Count, Absolute: 50.9 10*3/uL (ref 19.0–186.0)
Retic Ct Pct: 1.4 % (ref 0.4–3.1)
Reticulocyte Hemoglobin: 36.6 pg (ref >27.9)

## 2018-12-23 LAB — IMMATURE PLATELET FRACTION: Immature Platelet Fraction: 8.2 % (ref 1.2–8.6)

## 2018-12-23 LAB — PATHOLOGIST SMEAR REVIEW

## 2018-12-23 LAB — TECHNOLOGIST SMEAR REVIEW

## 2018-12-23 LAB — TSH: TSH: 0.572 u[IU]/mL (ref 0.350–4.500)

## 2018-12-25 ENCOUNTER — Telehealth: Payer: Self-pay

## 2018-12-25 LAB — COMP PANEL: LEUKEMIA/LYMPHOMA: Immunophenotypic Profile: 8

## 2018-12-25 LAB — H PYLORI, IGM, IGG, IGA AB
H pylori, IgM Abs: <9 units (ref 0.0–8.9)
H. pylori, IgA Abs: <9 units (ref 0.0–8.9)
H. pylori, IgG AbS: 0.5 Index Value (ref 0.00–0.79)

## 2018-12-25 LAB — PT AND PTT
INR: 1 (ref 0.8–1.2)
Prothrombin Time: 10.3 s (ref 9.1–12.0)
aPTT: 26 s (ref 24–33)

## 2018-12-25 NOTE — Telephone Encounter (Signed)
Called pt to inquire if he's ready to schedule EGD/Colonoscopy as previously planned. Pt agrees. Pt has requested that we contact his sister, Blanch Media, to set up his appointment.  Called pt sister to schedule pt procedure, unable to speak with pt sister but was able to leave message with her spouse to have her contact our office.

## 2018-12-25 NOTE — Telephone Encounter (Signed)
Returned Joyce's call to schedule to pt procedure.  Unable to contact, LVM to return call

## 2018-12-25 NOTE — Telephone Encounter (Signed)
-----   Message from Jonathon Bellows, MD sent at 12/19/2018 10:40 AM EDT ----- Nicholas Reyes morningI looked into his last office visit with me in April 2020 and my recommendations were to perform an EGD and colonoscopy.  Unfortunately I do not think he scheduled it or followed up subsequently.  Looking back on his MRI there was some features of enteritis in the past.  I would be happy to perform with EGD and colonoscopy and subsequently see him at the office as a follow-up.  I will have my office reach out to him to see if he would like [place] to pursue evaluation.  RegardsKiran  ----- Message ----- From: McLean-Scocuzza, Nino Glow, MD Sent: 12/17/2018   6:04 PM EDT To: Jonathon Bellows, MD  Would you consider EGD/colonoscopy in this patient I cant track down colonoscopy and having epigastric pain so EGD I checked H pylori as not resulted with breath test previously  Also Im trying to work him up for low white ct and platelets   Thanks [place]

## 2018-12-25 NOTE — Telephone Encounter (Signed)
Pt sister left vm she states she received a message for her to call us regarding pt's procedure

## 2018-12-26 ENCOUNTER — Other Ambulatory Visit: Payer: Self-pay

## 2018-12-26 ENCOUNTER — Telehealth: Payer: Self-pay | Admitting: Internal Medicine

## 2018-12-26 DIAGNOSIS — R1013 Epigastric pain: Secondary | ICD-10-CM

## 2018-12-26 MED ORDER — NA SULFATE-K SULFATE-MG SULF 17.5-3.13-1.6 GM/177ML PO SOLN: 1.0000 | Freq: Once | ORAL | 0 refills | Status: AC

## 2018-12-26 NOTE — Telephone Encounter (Signed)
Pt called back and stated that he'll go for the COVID test but wants to reschedule the procedure to the first week of September after the first of the month. Pt requests that we contact one of his sisters to pick a date.

## 2018-12-26 NOTE — Telephone Encounter (Signed)
Spoke with pt's sister Blanch Media, and informed her that I spoke with their sister, Marlowe Kays, today and was able to schedule pt endoscopy procedure. I then called pt to update him on his procedure date and Covid testing date. When I mentioned to pt that he would need a COVID test prior to the procedure pt became upset and refused stating he had the Covid test a couple of weeks ago. Pt then stated he "won't go for another test because nothing is wrong with him" and then went on to say "cancel the appointment" and ended the phone call. I informed pt sister Marlowe Kays and now Blanch Media and they both apologized for pt behavior and plan to speak with pt about proceeding with the COVID test.

## 2018-12-26 NOTE — Telephone Encounter (Signed)
[name] calling again l/m on v/m

## 2018-12-26 NOTE — Telephone Encounter (Signed)
Nicholas Reyes called & would like for you to call her about the procedure & information @ [phone].

## 2018-12-26 NOTE — Telephone Encounter (Signed)
Patient is calling to request some pain medication-  Patient does not know the name of the medication. Please advise  Preferred [place]

## 2018-12-27 ENCOUNTER — Ambulatory Visit: Payer: Self-pay | Admitting: *Deleted

## 2018-12-27 ENCOUNTER — Encounter: Payer: Self-pay | Admitting: Oncology

## 2018-12-27 NOTE — Telephone Encounter (Signed)
Called pt. No answer. LMTCB

## 2018-12-27 NOTE — Telephone Encounter (Signed)
Left message for patient to return call to office, [place] nurse please advise which medication he needs refilled for pain, and why.

## 2018-12-27 NOTE — Telephone Encounter (Signed)
I will not fill pain medication for him  This needs to come from pain clinic or ortho if for his knees   [place]

## 2018-12-27 NOTE — Telephone Encounter (Signed)
Agent was telling me what he was calling about.    He hung up before we were connected.    Apparently something about wanting pain medication.  I called him back.  "I need something for this pain".   I asked him where he hurt and he said,  "This pain in my ribs".   "I can't afford that test right now".    I asked what test?    "That virus test so they have put it off until after the 1st of Sept. When I get my check".    "I can't afford to come up there".    "I don't have any money to put gas in my car".   "It's on empty".      (I looked in his chart to get a better understanding what he was referring to.   Due to the pain he is having he has seen a GI doctor that wants him to have an endoscopy however pt doesn't have the money to put gas in his car to do the test until he gets his check after the 1st of Sept.  He also needs a COVID-19 test prior to having the endoscopy).  Until he gets his check he can't do anything because his car is on empty.  I let him know I would send his request for the hydrocodone to Dr. Terese Door.  During the conversation he was abrupt.   "I'm very ill and hurting if you can't tell".   I let him know I understand and I will send his message now.    He was agreeable with this plan.  I sent this message to the office.    Reason for Disposition . [1] Caller requesting a NON-URGENT new prescription or refill AND [2] triager unable to refill per unit policy    Hydrocodone refill requested  Answer Assessment - Initial Assessment Questions 1.   NAME of MEDICATION: "What medicine are you calling about?"     Hydrocodone 2.   QUESTION: "What is your question?"     I need it refilled for this pain in my right ribs. 3.   PRESCRIBING HCP: "Who prescribed it?" Reason: if prescribed by specialist, call should be referred to that group.     *No Answer* 4. SYMPTOMS: "Do you have any symptoms?"     I'm hurting in my ribs on the right side for the last 3-4 days. 5. SEVERITY:  If symptoms are present, ask "Are they mild, moderate or severe?"     "It's about to kill me".    "I just need the pain medicine". 6.  PREGNANCY:  "Is there any chance that you are pregnant?" "When was your last menstrual period?"     N/A  See documentation notes for more details.  Protocols used: MEDICATION QUESTION CALL-A-AH

## 2018-12-27 NOTE — Telephone Encounter (Signed)
Seen note from 12/27/18 pec nurse.

## 2018-12-30 NOTE — Telephone Encounter (Signed)
Spoke with pt sister, Marlowe Kays ([place]), and informed her that I spoke with pt last week and he agreed to reschedule procedure to September. I also informed her that pt LVM this morning stating he could go for the COVID test this afternoon for the Wednesday procedure but unfortunately the test has to be performed at least 72 hours prior to the procedure. We were able to reschedule pt procedure to 01-22-19. Pt sister states she'll contact pt to inform him.

## 2018-12-30 NOTE — Telephone Encounter (Signed)
Pt left vm he can go do his test this afternoon so he can get his procedure done on wednesday

## 2018-12-31 ENCOUNTER — Telehealth: Payer: Self-pay | Admitting: Internal Medicine

## 2018-12-31 ENCOUNTER — Emergency Department
Admission: EM | Admit: 2018-12-31 | Discharge: 2018-12-31 | Disposition: A | Discharge status: Home / Self Care | Payer: Medicare HMO | Attending: Student | Admitting: Student

## 2018-12-31 ENCOUNTER — Emergency Department: Payer: Medicare HMO

## 2018-12-31 ENCOUNTER — Other Ambulatory Visit: Payer: Self-pay | Admitting: Internal Medicine

## 2018-12-31 ENCOUNTER — Other Ambulatory Visit: Payer: Self-pay

## 2018-12-31 DIAGNOSIS — I2510 Atherosclerotic heart disease of native coronary artery without angina pectoris: Secondary | ICD-10-CM | POA: Insufficient documentation

## 2018-12-31 DIAGNOSIS — R1012 Left upper quadrant pain: Secondary | ICD-10-CM | POA: Insufficient documentation

## 2018-12-31 DIAGNOSIS — Z87891 Personal history of nicotine dependence: Secondary | ICD-10-CM | POA: Diagnosis not present

## 2018-12-31 DIAGNOSIS — Z7982 Long term (current) use of aspirin: Secondary | ICD-10-CM | POA: Insufficient documentation

## 2018-12-31 DIAGNOSIS — J449 Chronic obstructive pulmonary disease, unspecified: Secondary | ICD-10-CM | POA: Diagnosis not present

## 2018-12-31 DIAGNOSIS — Z79899 Other long term (current) drug therapy: Secondary | ICD-10-CM | POA: Insufficient documentation

## 2018-12-31 DIAGNOSIS — J189 Pneumonia, unspecified organism: Secondary | ICD-10-CM | POA: Insufficient documentation

## 2018-12-31 DIAGNOSIS — R1010 Upper abdominal pain, unspecified: Secondary | ICD-10-CM | POA: Diagnosis not present

## 2018-12-31 DIAGNOSIS — I10 Essential (primary) hypertension: Secondary | ICD-10-CM | POA: Diagnosis not present

## 2018-12-31 DIAGNOSIS — E119 Type 2 diabetes mellitus without complications: Secondary | ICD-10-CM | POA: Insufficient documentation

## 2018-12-31 LAB — URINALYSIS, COMPLETE (UACMP) WITH MICROSCOPIC
Bacteria, UA: NONE SEEN
Bilirubin Urine: NEGATIVE
Glucose, UA: NEGATIVE mg/dL
Hgb urine dipstick: NEGATIVE
Ketones, ur: NEGATIVE mg/dL
Leukocytes,Ua: NEGATIVE
Nitrite: NEGATIVE
Protein, ur: NEGATIVE mg/dL
Specific Gravity, Urine: 1.025 (ref 1.005–1.030)
Squamous Epithelial / HPF: NONE SEEN (ref 0–5)
pH: 5 (ref 5.0–8.0)

## 2018-12-31 LAB — COMPREHENSIVE METABOLIC PANEL
ALT: 17 U/L (ref 0–44)
AST: 19 U/L (ref 15–41)
Albumin: 3.4 g/dL — ABNORMAL LOW (ref 3.5–5.0)
Alkaline Phosphatase: 52 U/L (ref 38–126)
Anion gap: 9 (ref 5–15)
BUN: 12 mg/dL (ref 8–23)
CO2: 31 mmol/L (ref 22–32)
Calcium: 8.6 mg/dL — ABNORMAL LOW (ref 8.9–10.3)
Chloride: 100 mmol/L (ref 98–111)
Creatinine, Ser: 0.76 mg/dL (ref 0.61–1.24)
GFR calc Af Amer: >60 mL/min (ref >60)
GFR calc non Af Amer: >60 mL/min (ref >60)
Glucose, Bld: 160 mg/dL — ABNORMAL HIGH (ref 70–99)
Potassium: 3.8 mmol/L (ref 3.5–5.1)
Sodium: 140 mmol/L (ref 135–145)
Total Bilirubin: 0.7 mg/dL (ref 0.3–1.2)
Total Protein: 6.2 g/dL — ABNORMAL LOW (ref 6.5–8.1)

## 2018-12-31 LAB — CBC
HCT: 34.8 % — ABNORMAL LOW (ref 39.0–52.0)
Hemoglobin: 11.1 g/dL — ABNORMAL LOW (ref 13.0–17.0)
MCH: 33.2 pg (ref 26.0–34.0)
MCHC: 31.9 g/dL (ref 30.0–36.0)
MCV: 104.2 fL — ABNORMAL HIGH (ref 80.0–100.0)
Platelets: 40 10*3/uL — ABNORMAL LOW (ref 150–400)
RBC: 3.34 MIL/uL — ABNORMAL LOW (ref 4.22–5.81)
RDW: 14.8 % (ref 11.5–15.5)
WBC: 2.6 10*3/uL — ABNORMAL LOW (ref 4.0–10.5)
nRBC: 1.5 % — ABNORMAL HIGH (ref 0.0–0.2)

## 2018-12-31 LAB — LIPASE, BLOOD: Lipase: 25 U/L (ref 11–51)

## 2018-12-31 MED ORDER — ALUM & MAG HYDROXIDE-SIMETH 200-200-20 MG/5ML PO SUSP
30.0000 mL | Freq: Once | ORAL | Status: AC
  Administered 2018-12-31: 14:00:00 30 mL via ORAL
  Filled 2018-12-31: qty 30

## 2018-12-31 MED ORDER — SODIUM CHLORIDE 0.9% FLUSH: 3.0000 mL | Freq: Once | INTRAVENOUS | Status: DC

## 2018-12-31 MED ORDER — AZITHROMYCIN 250 MG PO TABS: ORAL_TABLET | ORAL | 0 refills | Status: AC

## 2018-12-31 MED ORDER — IBUPROFEN 400 MG PO TABS
400.0000 mg | ORAL_TABLET | Freq: Once | ORAL | Status: AC | PRN
  Administered 2018-12-31: 12:00:00 400 mg via ORAL
  Filled 2018-12-31: qty 1

## 2018-12-31 MED ORDER — CEFDINIR 300 MG PO CAPS: 300.0000 mg | ORAL_CAPSULE | Freq: Two times a day (BID) | ORAL | 0 refills | Status: AC

## 2018-12-31 MED ORDER — PANTOPRAZOLE SODIUM 40 MG PO TBEC
40.0000 mg | DELAYED_RELEASE_TABLET | Freq: Once | ORAL | Status: AC
  Administered 2018-12-31: 40 mg via ORAL
  Filled 2018-12-31: qty 1

## 2018-12-31 NOTE — ED Notes (Signed)
While RN at bedside pt appeared to have increased work of breathing. Put O2 sat monitor on him and he was 90. Put 2 L of oxygen on him and notified MD.

## 2018-12-31 NOTE — Telephone Encounter (Signed)
Dr. Jonathon Bellows  Please call patient he needs urgent referral for abdominal pain  Will not Rx pain narcotics for this he can take Tylenol as needed until further w/u with GI EGD/colonoscopy cancelled for some reason?    Thanks [employer] [facility]

## 2018-12-31 NOTE — ED Notes (Signed)
Pt comes to the front desk and request medication for headache, see mar for administered medications

## 2018-12-31 NOTE — ED Triage Notes (Signed)
Pt c/o upper abd pain for the past week. Denies N/V/D/ or constipation. States " I dont know if I have bleeding in my bowels" denies any black or bloody stools

## 2018-12-31 NOTE — Telephone Encounter (Signed)
Returned patient's call.  Patient stated that he was in the ED today and said he was dx w/ pneumonia and was prescribed antibiotics.  Patient c/o having stomach pain and rated it a 7 or 8 out of 10.  Patient was requesting to have pain medication called in.    Consulted w/ PCP.  Per Dr. Aundra Dubin, patient needs to see GI for stomach pain.  Dr. Aundra Dubin will submit a referral.  Patient aware.  Patient said that he was scheduled for an endoscopy last Friday but he said that the office called and cancelled it.

## 2018-12-31 NOTE — ED Provider Notes (Signed)
[facility] Emergency Department Provider Note  ____________________________________________   First MD Initiated Contact with Patient 12/31/18 1255     (approximate)  I have reviewed the triage vital signs and the nursing notes.  History  Chief Complaint Abdominal Pain    HPI Nicholas Reyes is a 63 y.o. male with history of CAD, leukopenia, thrombocytopenia, COPD who presents for upper abdominal pain. Left upper in location. Symptoms present x 2 days. Has a mild cough. No nausea or vomiting. No diarrhea or constipation. No fevers. No alleviating or aggravating factors. No changes with food. No sick contacts. No hx of same. States "I don't know if I'm having bleeding in my bowels." Denies any melena or hematochezia. Denies any hx of anticoagulation, heavy NSAID use, or alcohol use.          Past Medical Hx Past Medical History:  Diagnosis Date  . Arthritis   . Asthma   . CAD (coronary artery disease)   . COPD (chronic obstructive pulmonary disease) ([place])   . Depression   . Diabetes mellitus   . Dyspnea    with exertion  . GERD (gastroesophageal reflux disease)   . Heart disease    cad with 3 stents   . Hypertension   . MI (myocardial infarction) ([place])    3 stents  . Migraines    migraines  . Psoriasis    joint pain in knees left hand   . Sleep apnea    cpap    Problem List Patient Active Problem List   Diagnosis Date Noted  . Leukopenia 12/17/2018  . Thrombocytopenia ([place]) 12/17/2018  . Enteritis 04/10/2018  . 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 Chronic L5-S1 lumbar pars defect (Left) 03/27/2018  . Grade 1 Anterolisthesis of L5 over S1 03/27/2018  . Chronic knee pain (Right) 03/27/2018  . Chronic knee pain (Left) 03/27/2018  . Chronic patellofemoral knee pain (Left) 03/27/2018  . History of nephrolithiasis 03/27/2018  . Right flank pain 02/13/2018  . Osteoarthritis of knee (Bilateral) (R>L) 01/28/2018  . Tricompartment  osteoarthritis of knee (Right) 01/28/2018  . Osteoarthritis of hands (Bilateral) 01/28/2018  . Chronic anticoagulation (PLAVIX) 01/28/2018  . Chronic knee pain (Primary Area of Pain) (Bilateral) (R>L) 01/02/2018  . Chronic hand pain (Secondary Area of Pain) (Bilateral) (L>R) 01/02/2018  . Chronic pain syndrome 01/02/2018  . Long term current use of opiate analgesic 01/02/2018  . Pharmacologic therapy 01/02/2018  . Disorder of skeletal system 01/02/2018  . Problems influencing health status 01/02/2018  . Cognitive impairment 12/11/2017  . DM2 (diabetes mellitus, type 2) ([place]) 12/04/2017  . Psoriasis 11/15/2017  . Skin sore 11/15/2017  . Diabetes mellitus without complication ([place]) [id number]  . Vitamin D deficiency 11/15/2017  . Essential hypertension 06/11/2017  . Coronary artery disease involving native coronary artery of native heart without angina pectoris 06/11/2017  . Exercise-induced angina ([place]) 01/31/2016  . Hypertension 05/18/2014  . Edema 02/09/2014  . Mixed hyperlipidemia 04/02/2013  . HTN (hypertension) 04/02/2013  . Chest pain with high risk for cardiac etiology 12/12/2012  . Dyspnea 12/12/2012  . Infective otitis externa 09/25/2012  . Coronary atherosclerosis 09/02/2012  . Otalgia 09/02/2012  . Benign essential hypertension 08/02/2012  . Chronic airway obstruction, not elsewhere classified 08/02/2012  . Depressive disorder, not elsewhere classified 08/02/2012  . Family history of ischemic heart disease (IHD) 07/08/2012  . Family history of ischemic heart disease and other diseases of the circulatory system 07/08/2012  . Allergic rhinitis 07/05/2012  . Other  psoriasis 07/05/2012  . Benign neoplasm of colon 09/22/2010  . Arthritis 06/09/2010  . Asthma 06/09/2010  . Diarrhea 06/09/2010  . Headaches due to old head injury 06/09/2010  . Type 2 diabetes mellitus without complications ([place]) [id number]  . Abdominal pain 06/09/2010    Past Surgical Hx Past Surgical  History:  Procedure Laterality Date  . APPENDECTOMY    . CATARACT EXTRACTION Bilateral    inserted lens b/l eyes right 03/12/17, left 05/28/17   . CHOLECYSTECTOMY    . CORONARY ANGIOPLASTY WITH STENT PLACEMENT     LAD, LCX-OM, ? 3rd location  . Reyes SURGERY     cataract b/l   . HEMORRHOID SURGERY    . hemorrhoid surgery      x 2   . KNEE SURGERY     right knee arthroscopy, partial synovectecomy 04/21/15 and meniscectomy/chondroplasty   . LEFT HEART CATH AND CORONARY ANGIOGRAPHY Left 01/30/2018   Procedure: LEFT HEART CATH AND CORONARY ANGIOGRAPHY;  Surgeon: Isaias Cowman, MD;  Location: [place] [facility];  Service: Cardiovascular;  Laterality: Left;  . right knee surgery     ? type of procedure   . STENT PLACEMENT VASCULAR ([place] HX)    . TONSILLECTOMY    . TONSILLECTOMY AND ADENOIDECTOMY      Medications Prior to Admission medications   Medication Sig Start Date End Date Taking? Authorizing Provider  acetaminophen (TYLENOL) 500 MG tablet Take 1 tablet (500 mg total) by mouth every 6 (six) hours as needed. 09/19/18   Laban Emperor, PA-C  albuterol (PROVENTIL HFA;VENTOLIN HFA) 108 (90 Base) MCG/ACT inhaler Inhale 2 puffs into the lungs every 4 (four) hours as needed. For shortness of breath 05/14/18   McLean-Scocuzza, Nino Glow, MD  amLODipine (NORVASC) 5 MG tablet Take 5 mg by mouth daily.    [provider]  aspirin 81 MG EC tablet Take 81 mg by mouth daily.     [provider]  atorvastatin (LIPITOR) 20 MG tablet Take 1 tablet (20 mg total) by mouth daily at 6 PM. 10/03/18   McLean-Scocuzza, Nino Glow, MD  budesonide-formoterol [facility]) 160-4.5 MCG/ACT inhaler Inhale 2 puffs into the lungs 2 (two) times daily. Rinse mouth 05/14/18   McLean-Scocuzza, Nino Glow, MD  cetirizine (ZYRTEC) 10 MG tablet Take 10 mg by mouth daily.    [provider]  Cholecalciferol 50000 units capsule Take 1 capsule (50,000 Units total) by mouth once a week. 12/10/17    McLean-Scocuzza, Nino Glow, MD  clobetasol ointment (TEMOVATE) [id number] % Apply 1 application topically 2 (two) times daily. Right ear as needed not face or private 04/10/18   McLean-Scocuzza, Nino Glow, MD  clotrimazole (LOTRIMIN) 1 % cream Apply 1 application topically 2 (two) times daily. 09/20/18   McLean-Scocuzza, Nino Glow, MD  diclofenac (FLECTOR) 1.3 % PTCH Place 1 patch onto the skin 2 (two) times daily. 11/30/17   McLean-Scocuzza, Nino Glow, MD  dicyclomine (BENTYL) 10 MG capsule Take 1 capsule (10 mg total) by mouth 4 (four) times daily -  before meals and at bedtime. 06/26/18   Jonathon Bellows, MD  DULoxetine (CYMBALTA) 60 MG capsule Take 1 capsule (60 mg total) by mouth daily. 01/11/18   McLean-Scocuzza, Nino Glow, MD  fluticasone (FLONASE) 50 MCG/ACT nasal spray Place 2 sprays into both nostrils daily. 05/14/18   McLean-Scocuzza, Nino Glow, MD  furosemide (LASIX) 40 MG tablet Take 1 tablet (40 mg total) by mouth 2 (two) times daily. In am and at lunch 07/10/18   McLean-Scocuzza,  Nino Glow, MD  glucose blood test strip 1 each by Other route 3 (three) times daily as needed. DX- E11.9 Diabetes Mellitus type 2. 11/06/18   McLean-Scocuzza, Nino Glow, MD  hydrocortisone 2.5 % cream Apply topically 2 (two) times daily. Left face left of Reyes 07/10/18   McLean-Scocuzza, Nino Glow, MD  hydrOXYzine (ATARAX/VISTARIL) 25 MG tablet Take 1 tablet (25 mg total) by mouth 3 (three) times daily as needed. 05/14/18   McLean-Scocuzza, Nino Glow, MD  isosorbide mononitrate (IMDUR) 30 MG 24 hr tablet Take 1 tablet (30 mg total) by mouth daily. 01/11/18   McLean-Scocuzza, Nino Glow, MD  ketorolac (ACULAR) 0.5 % ophthalmic solution  12/03/17   [provider]  meloxicam (MOBIC) 7.5 MG tablet Take 1 tablet (7.5 mg total) by mouth 2 (two) times daily as needed for pain. 07/07/18   McLean-Scocuzza, Nino Glow, MD  metFORMIN (GLUCOPHAGE) 500 MG tablet Take 1 tablet (500 mg total) by mouth daily with breakfast. 11/23/17   McLean-Scocuzza, Nino Glow, MD  metoprolol  tartrate (LOPRESSOR) 25 MG tablet Take 25 mg by mouth 2 (two) times daily.    [provider]  montelukast (SINGULAIR) 10 MG tablet Take 10 mg by mouth at bedtime.    [provider]  mupirocin ointment (BACTROBAN) 2 % Apply 1 application topically 2 (two) times daily. Any open sores 07/10/18   McLean-Scocuzza, Nino Glow, MD  nitroGLYCERIN (NITROSTAT) 0.4 MG SL tablet Place 0.4 mg under the tongue every 5 (five) minutes as needed. For chest pain    [provider]  omeprazole (PRILOSEC) 40 MG capsule Take 1 capsule (40 mg total) by mouth daily. 06/26/18 08/27/18  Jonathon Bellows, MD  ondansetron (ZOFRAN) 4 MG tablet Take 1 tablet (4 mg total) by mouth every 8 (eight) hours as needed for nausea or vomiting. 12/19/17   Guse, Jacquelynn Cree, FNP  oxyCODONE-acetaminophen (PERCOCET) 5-325 MG tablet Take 1 tablet by mouth 2 (two) times daily as needed for severe pain. Patient not taking: Reported on 12/19/2018 09/20/18   McLean-Scocuzza, Nino Glow, MD  pantoprazole (PROTONIX) 20 MG tablet TAKE 1 TABLET EVERY DAY 04/29/18   Burnard Hawthorne, FNP  potassium chloride SA (K-DUR,KLOR-CON) 20 MEQ tablet Take 1 tablet (20 mEq total) by mouth daily. 07/10/18   McLean-Scocuzza, Nino Glow, MD  prednisoLONE acetate (PRED FORTE) 1 % ophthalmic suspension  11/30/17   [provider]  Secukinumab (COSENTYX 300 DOSE [place]) Inject into the skin every 30 (thirty) days.    [provider]  spironolactone (ALDACTONE) 25 MG tablet Take 1 tablet (25 mg total) by mouth daily. In am for blood pressure 05/03/18   McLean-Scocuzza, Nino Glow, MD  sucralfate (CARAFATE) 1 g tablet Take 1 tablet (1 g total) by mouth 4 (four) times daily -  with meals and at bedtime. Patient not taking: Reported on 12/19/2018 02/24/18 02/24/19  Merlyn Lot, MD  sulfamethoxazole-trimethoprim (BACTRIM DS) 800-160 MG tablet Take 1 tablet by mouth 2 (two) times daily. Patient not taking: Reported on 12/19/2018 10/08/18   Cuthriell,  Charline Bills, PA-C  triamcinolone cream (KENALOG) 0.1 % Apply 1 application topically 2 (two) times daily. Not face, groin, underarms 11/15/17   McLean-Scocuzza, Nino Glow, MD    Allergies Patient has no known allergies.  Family Hx Family History  Problem Relation Age of Onset  . Cancer Mother   . Heart disease Father   . Stroke Father   . Heart disease Brother     Social Hx Social History  Tobacco Use  . Smoking status: Former Smoker    Years: 5.00    Types: Cigarettes  . Smokeless tobacco: Former Systems developer    Quit date: 12/19/2015  . Tobacco comment: 3-4 cig every couple days  Substance Use Topics  . Alcohol use: No    Frequency: Never  . Drug use: No     Review of Systems  Constitutional: Negative for fever. Negative for chills. Eyes: Negative for visual changes. ENT: Negative for sore throat. Cardiovascular: Negative for chest pain. Respiratory: Negative for shortness of breath. Gastrointestinal: + for abdominal pain. Negative for nausea. Negative for vomiting. Genitourinary: Negative for dysuria. Musculoskeletal: Negative for leg swelling. Skin: Negative for rash. Neurological: Negative for for headaches.   Physical Exam  Vital Signs: ED Triage Vitals  Enc Vitals Group     BP 12/31/18 1103 129/66     Pulse Rate 12/31/18 1103 79     Resp 12/31/18 1103 17     Temp 12/31/18 1103 98.5 F (36.9 C)     Temp Source 12/31/18 1103 Oral     SpO2 12/31/18 1103 95 %     Weight 12/31/18 1104 231 lb (104.8 kg)     Height 12/31/18 1104 5\' 11"  (1.803 m)     Head Circumference --      Peak Flow --      Pain Score 12/31/18 1104 5     Pain Loc --      Pain Edu? --      Excl. in [place]? --     Constitutional: Alert and oriented. Appears older than stated age. Eyes: Conjunctivae clear. Sclera anicteric. Head: Normocephalic. Atraumatic. Nose: No congestion. No rhinorrhea. Mouth/Throat: Mucous membranes are moist.  Neck: No stridor.   Cardiovascular: Normal rate, regular  rhythm. No murmurs. Extremities well perfused. Respiratory: Normal respiratory effort.  Scattered occasional wheezing. Decreased at left base, otherwise clear. No accessory muscle use. Gastrointestinal: Soft and non-tender throughout all 4 quadrants, with deep palpation. No distention. Patient declines rectal exam. Musculoskeletal: No lower extremity edema. Neurologic:  Normal speech and language. No gross focal neurologic deficits are appreciated.  Skin: Skin is warm, dry and intact. No rash noted. Psychiatric: Mood and affect are appropriate for situation.  EKG  Personally reviewed.   Rate: 76 Rhythm: sinus Axis: normal Intervals: WNL No acute ischemic changes   Radiology  Possible left lower base opacity compared to prior.    Procedures  Procedure(s) performed (including critical care):  Procedures   Initial Impression / Assessment and Plan / ED Course  63 y.o. male with history of COPD, who presents to the ED for left upper abdominal pain x 2 days, as above.   Ddx includes gastritis, pancreatitis, biliary colic, lower lobe PNA. With regards to his question about bleeding, low suspicion for clinically significant GI bleed based on history, vitals. He declines rectal exam here to evaluate via guaiac, though he is scheduled for a colonoscopy in a few weeks. H&H largely unchanged from prior.   Work up reveals normal hepatic function panel, normal lipase. CBC appears consistent with prior and known medical history. XR with questionable left lower base opacity (esp compared to prior) which seems consistent with exam, and may be source of referred LUQ discomfort. Will plan to treat as such with course of antibiotics for presumed PNA, patient agreeable. Advised PCP follow up, given return precautions.   Final Clinical Impression(s) / ED Diagnosis  Final diagnoses:  Abdominal pain, LUQ (left upper quadrant)  Community acquired  pneumonia of left lower lobe of lung [facility])      Note:  This document was prepared using Dragon voice recognition software and may include unintentional dictation errors.   Lilia Pro., MD 12/31/18 [phone]

## 2018-12-31 NOTE — Discharge Instructions (Addendum)
Thank you for letting us take care of you in the emergency department today. At this time, we think your symptoms may be due to a pneumonia. We will send you home with antibiotics for this.  Please continue to take your regular, prescribed medications.   New medications we have prescribed:  - Azithromycin, Z-Pak - Omnicef (cefdinir)  Please follow up with: - Your primary care doctor to review your ER visit and follow up on your symptoms.   Please return to the ER for any new or worsening symptoms.

## 2018-12-31 NOTE — Telephone Encounter (Signed)
Pt called in requesting something for body pain. Pt says that he has been at the ED all day and was Dx with pneumonia.    Pharmacy:  [facility] [address]), [place] [phone] (Phone) [phone] (Fax)

## 2019-01-01 ENCOUNTER — Telehealth: Payer: Self-pay | Admitting: Gastroenterology

## 2019-01-01 ENCOUNTER — Other Ambulatory Visit: Payer: Self-pay | Admitting: *Deleted

## 2019-01-01 NOTE — Telephone Encounter (Signed)
Pt came to office asking to speak to Dr. Vicente Males to see if he could get some pain Meds he states he went to ER and they referred him to Dr. Vicente Males. Due to not being able to be seen we scheduled him for a televisit. Per nurse [place] pt needs in office apt due to abd. Pain. Pt is now scheduled for apt in office 01/08/19 he states he needs Pain Meds for his stomach.

## 2019-01-01 NOTE — Telephone Encounter (Signed)
Pt has been scheduled and is aware of appointment information.

## 2019-01-01 NOTE — Patient Outreach (Addendum)
[facility]) Care Management  01/01/2019  Nicholas Reyes 1956/04/28 [id number]   [place]  Referral Date:02/25/2018 Referral Source:[facility] ED Census Reason for Referral:6 or more ED visits in the past 6 months Insurance:[employer] Medicare & Medicaid   Addendum:  Received voicemail message from patient returning call.  RN Health Coach outreach to patient again, patient answered.  RN Health Coach introduced self and was attempting to verify HIPAA and patient stated "ok" and hung line up.  RN Health Coach outreached to patient again with no answer.  RN Health Coach will attempt another telephone outreach to patient within the month of September  Outreach Attempt:  Outreach attempt #6 to patient for follow up and post emergency room visit. No answer. RN Health Coach left HIPAA compliant voicemail message along with contact information.  Plan:  RN Health Coach will make another outreach attempt to patient within the month of September.  [place] [phone] [name]

## 2019-01-01 NOTE — Telephone Encounter (Signed)
Jadijah   I see that the patient is scheduled for procedures in the middle of next month.  Can you have the patient come and see me next week sometime.  He has not been seen for a few months.  There is some issues with thrombocytopenia and neutropenia which I would need to review with the patient.  C/c McLean-Scocuzza, Nino Glow, MD    Dr Jonathon Bellows MD,MRCP [facility]) Gastroenterology/Hepatology Pager: [phone]

## 2019-01-02 ENCOUNTER — Ambulatory Visit: Payer: Medicare HMO | Admitting: Gastroenterology

## 2019-01-02 ENCOUNTER — Other Ambulatory Visit: Payer: Self-pay

## 2019-01-02 MED ORDER — SUCRALFATE 1 GM/10ML PO SUSP: 1.0000 g | Freq: Four times a day (QID) | ORAL | 2 refills | Status: DC

## 2019-01-02 NOTE — Telephone Encounter (Signed)
Spoke with pt and informed him of Dr. Georgeann Oppenheim suggestions. Pt states he's taking his PPI medication. Pt agrees to take carafate (new prescription sent). Pt denies taking NSAIDs.

## 2019-01-02 NOTE — Telephone Encounter (Signed)
Check and make sure he isnt on any NSAID's

## 2019-01-02 NOTE — Telephone Encounter (Signed)
Ensure on PPI BID, take carafate - can try IB guard.

## 2019-01-06 ENCOUNTER — Other Ambulatory Visit: Payer: Self-pay | Admitting: *Deleted

## 2019-01-06 DIAGNOSIS — D696 Thrombocytopenia, unspecified: Secondary | ICD-10-CM

## 2019-01-06 DIAGNOSIS — D709 Neutropenia, unspecified: Secondary | ICD-10-CM

## 2019-01-08 ENCOUNTER — Other Ambulatory Visit: Payer: Self-pay

## 2019-01-08 ENCOUNTER — Ambulatory Visit (INDEPENDENT_AMBULATORY_CARE_PROVIDER_SITE_OTHER): Payer: Medicare HMO | Admitting: Gastroenterology

## 2019-01-08 ENCOUNTER — Encounter: Payer: Self-pay | Admitting: Gastroenterology

## 2019-01-08 ENCOUNTER — Ambulatory Visit: Payer: Medicare HMO | Admitting: Gastroenterology

## 2019-01-08 VITALS — BP 114/70 | HR 96 | Temp 98.4°F | Wt 228.5 lb

## 2019-01-08 DIAGNOSIS — R1013 Epigastric pain: Secondary | ICD-10-CM | POA: Diagnosis not present

## 2019-01-08 MED ORDER — DICYCLOMINE HCL 10 MG PO CAPS: 10.0000 mg | ORAL_CAPSULE | Freq: Three times a day (TID) | ORAL | 0 refills | Status: AC

## 2019-01-08 NOTE — Addendum Note (Signed)
Addended by: Dorethea Clan on: 01/08/2019 10:10 AM   Modules accepted: Orders

## 2019-01-08 NOTE — Progress Notes (Signed)
Jonathon Bellows MD, MRCP(U.K) [address]  [place]  [place], [place] [address]  Main: [phone]  Fax: [phone]   Primary Care Physician: McLean-Scocuzza, Nino Glow, MD  Primary Gastroenterologist:  Dr. Jonathon Bellows   Abdominal pain follow-up  HPI: Nicholas Reyes is a 63 y.o. male    Summary of history :  Heishere today to see me for a follow-up for abdominal pain.  Had a telemetry visit in April 2020 in the middle of [place]. He presented to the emergency room on 04/09/2018 with abdominal pain of one-week duration. CT scan of the abdomen was performed which showed a mildly dilated loop of jejunum with wall thickening possibly representing enteritis/dysmotility/partial obstruction. It is unclear what happened subsequently but I cannot see any other inpatient notes but it appears that he was discharged.  04/09/2018 hemoglobin 14.2 platelet count of 142. Lipase was 34. CMP normal. Urine analysis negative.  Abdominal pain:ongoing for a few months , getting worse , every day , each episode 2-3 hours , Site :central abdominal,Radiation:localized,10/10,sharp,No aggrevating or relieving factors. , no wt loss.NSAID [id number] use :no Gall bladder surgery:taken out Frequency of bowel movements:every day 1-2 times daily Change in bowel movements:no - cant recall results of his last colonoscopy - he cant recall when he was told to return,Relief with bowel movements:for a few minutes Gas/Bloating/Abdominal distension:no 04/30/2018 : MR enterogram : normal. Small b/l renal cysts.    Interval history2/19/2020-09/03/2018  Still has abdominal pain and abdomen is entered the room he requested if I could give him something for the pain.  He states the pain is present all the time.  Denies any NSAID use.  No different from his last visit.     Current Outpatient Medications  Medication Sig Dispense Refill  . acetaminophen (TYLENOL) 500 MG tablet Take 1  tablet (500 mg total) by mouth every 6 (six) hours as needed. 30 tablet 0  . albuterol (PROVENTIL HFA;VENTOLIN HFA) 108 (90 Base) MCG/ACT inhaler Inhale 2 puffs into the lungs every 4 (four) hours as needed. For shortness of breath 3 Inhaler 4  . amLODipine (NORVASC) 5 MG tablet Take 5 mg by mouth daily.    Marland Kitchen aspirin 81 MG EC tablet Take 81 mg by mouth daily.     Marland Kitchen atorvastatin (LIPITOR) 20 MG tablet Take 1 tablet (20 mg total) by mouth daily at 6 PM. 90 tablet 3  . budesonide-formoterol (SYMBICORT) 160-4.5 MCG/ACT inhaler Inhale 2 puffs into the lungs 2 (two) times daily. Rinse mouth 1 Inhaler 12  . cetirizine (ZYRTEC) 10 MG tablet Take 10 mg by mouth daily.    . Cholecalciferol 50000 units capsule Take 1 capsule (50,000 Units total) by mouth once a week. 13 capsule 1  . clobetasol ointment (TEMOVATE) [id number] % Apply 1 application topically 2 (two) times daily. Right ear as needed not face or private 45 g 0  . clotrimazole (LOTRIMIN) 1 % cream Apply 1 application topically 2 (two) times daily. 60 g 11  . diclofenac (FLECTOR) 1.3 % PTCH Place 1 patch onto the skin 2 (two) times daily. 60 patch 2  . dicyclomine (BENTYL) 10 MG capsule Take 1 capsule (10 mg total) by mouth 4 (four) times daily -  before meals and at bedtime. 90 capsule 0  . DULoxetine (CYMBALTA) 60 MG capsule Take 1 capsule (60 mg total) by mouth daily. 90 capsule 3  . fluticasone (FLONASE) 50 MCG/ACT nasal spray Place 2 sprays into both nostrils daily. 16 g 6  .  furosemide (LASIX) 40 MG tablet Take 1 tablet (40 mg total) by mouth 2 (two) times daily. In am and at lunch 60 tablet 5  . glucose blood test strip 1 each by Other route 3 (three) times daily as needed. DX- E11.9 Diabetes Mellitus type 2. 420 each 2  . hydrocortisone 2.5 % cream Apply topically 2 (two) times daily. Left face left of eye 60 g 0  . hydrOXYzine (ATARAX/VISTARIL) 25 MG tablet Take 1 tablet (25 mg total) by mouth 3 (three) times daily as needed. 30 tablet 0  .  isosorbide mononitrate (IMDUR) 30 MG 24 hr tablet Take 1 tablet (30 mg total) by mouth daily. 90 tablet 3  . ketorolac (ACULAR) 0.5 % ophthalmic solution     . meloxicam (MOBIC) 7.5 MG tablet Take 1 tablet (7.5 mg total) by mouth 2 (two) times daily as needed for pain. 60 tablet 2  . metFORMIN (GLUCOPHAGE) 500 MG tablet Take 1 tablet (500 mg total) by mouth daily with breakfast. 90 tablet 3  . metoprolol tartrate (LOPRESSOR) 25 MG tablet Take 25 mg by mouth 2 (two) times daily.    . montelukast (SINGULAIR) 10 MG tablet Take 10 mg by mouth at bedtime.    . mupirocin ointment (BACTROBAN) 2 % Apply 1 application topically 2 (two) times daily. Any open sores 60 g 2  . nitroGLYCERIN (NITROSTAT) 0.4 MG SL tablet Place 0.4 mg under the tongue every 5 (five) minutes as needed. For chest pain    . omeprazole (PRILOSEC) 40 MG capsule Take 1 capsule (40 mg total) by mouth daily. 90 capsule 3  . ondansetron (ZOFRAN) 4 MG tablet Take 1 tablet (4 mg total) by mouth every 8 (eight) hours as needed for nausea or vomiting. 20 tablet 0  . oxyCODONE-acetaminophen (PERCOCET) 5-325 MG tablet Take 1 tablet by mouth 2 (two) times daily as needed for severe pain. (Patient not taking: Reported on 12/19/2018) 10 tablet 0  . pantoprazole (PROTONIX) 20 MG tablet TAKE 1 TABLET EVERY DAY 90 tablet 0  . potassium chloride SA (K-DUR,KLOR-CON) 20 MEQ tablet Take 1 tablet (20 mEq total) by mouth daily. 90 tablet 1  . prednisoLONE acetate (PRED FORTE) 1 % ophthalmic suspension     . Secukinumab (COSENTYX 300 DOSE [place]) Inject into the skin every 30 (thirty) days.    Marland Kitchen spironolactone (ALDACTONE) 25 MG tablet Take 1 tablet (25 mg total) by mouth daily. In am for blood pressure 90 tablet 3  . sucralfate (CARAFATE) 1 g tablet Take 1 tablet (1 g total) by mouth 4 (four) times daily -  with meals and at bedtime. (Patient not taking: Reported on 12/19/2018) 120 tablet 0  . sucralfate (CARAFATE) 1 GM/10ML suspension Take 10 mLs (1 g total) by  mouth 4 (four) times daily. 1200 mL 2  . sulfamethoxazole-trimethoprim (BACTRIM DS) 800-160 MG tablet Take 1 tablet by mouth 2 (two) times daily. (Patient not taking: Reported on 12/19/2018) 14 tablet 0  . triamcinolone cream (KENALOG) 0.1 % Apply 1 application topically 2 (two) times daily. Not face, groin, underarms 454 g 11   No current facility-administered medications for this visit.     Allergies as of 01/08/2019  . (No Known Allergies)    ROS:  General: Negative for anorexia, weight loss, fever, chills, fatigue, weakness. ENT: Negative for hoarseness, difficulty swallowing , nasal congestion. CV: Negative for chest pain, angina, palpitations, dyspnea on exertion, peripheral edema.  Respiratory: Negative for dyspnea at rest, dyspnea on exertion, cough, sputum,  wheezing.  GI: See history of present illness. GU:  Negative for dysuria, hematuria, urinary incontinence, urinary frequency, nocturnal urination.  Endo: Negative for unusual weight change.    Physical Examination:   There were no vitals taken for this visit.  General: Well-nourished, well-developed in no acute distress.  Eyes: No icterus. Conjunctivae pink. Mouth: Oropharyngeal mucosa moist and pink , no lesions erythema or exudate. Lungs: Clear to auscultation bilaterally. Non-labored. Heart: Regular rate and rhythm, no murmurs rubs or gallops.  Abdomen: Bowel sounds are normal, nontender, nondistended, no hepatosplenomegaly or masses, no abdominal bruits or hernia , no rebound or guarding.   Extremities: No lower extremity edema. No clubbing or deformities. Neuro: Alert and oriented x 3.  Grossly intact. Skin: Warm and dry, no jaundice.   Psych: Alert and cooperative, normal mood and affect.   Imaging Studies: Dg Chest Port 1 View  Result Date: 12/31/2018 CLINICAL DATA:  Upper abdominal pain. EXAM: PORTABLE CHEST 1 VIEW COMPARISON:  Radiographs of August 05, 2018. FINDINGS: Stable cardiomediastinal silhouette. No  pneumothorax or pleural effusion is noted. Both lungs are clear. The visualized skeletal structures are unremarkable. IMPRESSION: No active disease. Electronically Signed   By: Marijo Conception M.D.   On: 12/31/2018 14:26    Assessment and Plan:   Nicholas Reyes is a 63 y.o. y/o malehere to follow upfor abdominal pain. Recent ER visit and CT abdomen showed features of possible enteritis. MRE showed resolution. Pain resolved on PPI, returned when he went off it but still persists since resumption.    Plan  1.. EGD+ colonoscopy 2.  H. pylori breath test 3.  Continue PPI. 4.  Bentyl 4 times daily as needed for pain. 5.  PRN IBgard samples will be provided.  I have discussed alternative options, risks & benefits,  which include, but are not limited to, bleeding, infection, perforation,respiratory complication & drug reaction.  The patient agrees with this plan & written consent will be obtained.      Dr Jonathon Bellows  MD,MRCP [facility]) Follow up in 6 weeks

## 2019-01-09 LAB — H. PYLORI BREATH TEST: H pylori Breath Test: NEGATIVE

## 2019-01-10 ENCOUNTER — Ambulatory Visit: Payer: Medicare HMO | Admitting: Oncology

## 2019-01-14 ENCOUNTER — Other Ambulatory Visit: Payer: Self-pay | Admitting: Physician Assistant

## 2019-01-14 ENCOUNTER — Encounter: Payer: Self-pay | Admitting: Gastroenterology

## 2019-01-14 NOTE — Progress Notes (Signed)
Patient on schedule for BMB 01/15/2019,called patient to go over instructions for procedure as well as questions answered. Confirmed with patient to be here at 0730. Stated understanding.

## 2019-01-15 ENCOUNTER — Telehealth: Payer: Self-pay | Admitting: Family

## 2019-01-15 ENCOUNTER — Ambulatory Visit
Admission: RE | Admit: 2019-01-15 | Discharge: 2019-01-15 | Disposition: A | Discharge status: Home / Self Care | Payer: Medicare HMO | Source: Ambulatory Visit | Attending: Oncology | Admitting: Oncology

## 2019-01-15 ENCOUNTER — Telehealth: Payer: Self-pay

## 2019-01-15 ENCOUNTER — Other Ambulatory Visit: Payer: Self-pay

## 2019-01-15 ENCOUNTER — Other Ambulatory Visit (HOSPITAL_COMMUNITY)
Admission: RE | Admit: 2019-01-15 | Disposition: A | Discharge status: Home / Self Care | Payer: Medicare HMO | Source: Ambulatory Visit | Attending: Oncology | Admitting: Oncology

## 2019-01-15 ENCOUNTER — Encounter
Admit: 2019-01-15 | Discharge: 2019-01-15 | Payer: MEDICARE | Attending: Hematology & Oncology | Primary: Hematology & Oncology

## 2019-01-15 DIAGNOSIS — Z791 Long term (current) use of non-steroidal anti-inflammatories (NSAID): Secondary | ICD-10-CM | POA: Insufficient documentation

## 2019-01-15 DIAGNOSIS — M1990 Unspecified osteoarthritis, unspecified site: Secondary | ICD-10-CM | POA: Insufficient documentation

## 2019-01-15 DIAGNOSIS — D696 Thrombocytopenia, unspecified: Secondary | ICD-10-CM | POA: Insufficient documentation

## 2019-01-15 DIAGNOSIS — K219 Gastro-esophageal reflux disease without esophagitis: Secondary | ICD-10-CM | POA: Insufficient documentation

## 2019-01-15 DIAGNOSIS — D709 Neutropenia, unspecified: Secondary | ICD-10-CM | POA: Diagnosis not present

## 2019-01-15 DIAGNOSIS — Z7901 Long term (current) use of anticoagulants: Secondary | ICD-10-CM | POA: Diagnosis not present

## 2019-01-15 DIAGNOSIS — I2510 Atherosclerotic heart disease of native coronary artery without angina pectoris: Secondary | ICD-10-CM | POA: Insufficient documentation

## 2019-01-15 DIAGNOSIS — G4730 Sleep apnea, unspecified: Secondary | ICD-10-CM | POA: Diagnosis not present

## 2019-01-15 DIAGNOSIS — F329 Major depressive disorder, single episode, unspecified: Secondary | ICD-10-CM | POA: Insufficient documentation

## 2019-01-15 DIAGNOSIS — M4306 Spondylolysis, lumbar region: Secondary | ICD-10-CM

## 2019-01-15 DIAGNOSIS — Z87891 Personal history of nicotine dependence: Secondary | ICD-10-CM | POA: Insufficient documentation

## 2019-01-15 DIAGNOSIS — E118 Type 2 diabetes mellitus with unspecified complications: Secondary | ICD-10-CM | POA: Insufficient documentation

## 2019-01-15 DIAGNOSIS — Z79899 Other long term (current) drug therapy: Secondary | ICD-10-CM | POA: Diagnosis not present

## 2019-01-15 DIAGNOSIS — I10 Essential (primary) hypertension: Secondary | ICD-10-CM | POA: Insufficient documentation

## 2019-01-15 DIAGNOSIS — Z7982 Long term (current) use of aspirin: Secondary | ICD-10-CM | POA: Insufficient documentation

## 2019-01-15 DIAGNOSIS — J45909 Unspecified asthma, uncomplicated: Secondary | ICD-10-CM | POA: Insufficient documentation

## 2019-01-15 DIAGNOSIS — N50819 Testicular pain, unspecified: Secondary | ICD-10-CM

## 2019-01-15 DIAGNOSIS — D7589 Other specified diseases of blood and blood-forming organs: Secondary | ICD-10-CM | POA: Diagnosis not present

## 2019-01-15 DIAGNOSIS — Z7984 Long term (current) use of oral hypoglycemic drugs: Secondary | ICD-10-CM | POA: Diagnosis not present

## 2019-01-15 DIAGNOSIS — J449 Chronic obstructive pulmonary disease, unspecified: Secondary | ICD-10-CM | POA: Insufficient documentation

## 2019-01-15 DIAGNOSIS — D759 Disease of blood and blood-forming organs, unspecified: Secondary | ICD-10-CM | POA: Diagnosis not present

## 2019-01-15 DIAGNOSIS — D61818 Other pancytopenia: Secondary | ICD-10-CM | POA: Diagnosis not present

## 2019-01-15 LAB — CBC WITH DIFFERENTIAL/PLATELET
Abs Immature Granulocytes: 0.1 10*3/uL — ABNORMAL HIGH (ref 0.00–0.07)
Band Neutrophils: 2 %
Basophils Absolute: 0 10*3/uL (ref 0.0–0.1)
Basophils Relative: 0 %
Eosinophils Absolute: 0.1 10*3/uL (ref 0.0–0.5)
Eosinophils Relative: 3 %
HCT: 29.4 % — ABNORMAL LOW (ref 39.0–52.0)
Hemoglobin: 9.8 g/dL — ABNORMAL LOW (ref 13.0–17.0)
Lymphocytes Relative: 54 %
Lymphs Abs: 1.4 10*3/uL (ref 0.7–4.0)
MCH: 33.7 pg (ref 26.0–34.0)
MCHC: 33.3 g/dL (ref 30.0–36.0)
MCV: 101 fL — ABNORMAL HIGH (ref 80.0–100.0)
Metamyelocytes Relative: 2 %
Monocytes Absolute: 0.2 10*3/uL (ref 0.1–1.0)
Monocytes Relative: 6 %
Neutro Abs: 0.8 10*3/uL — ABNORMAL LOW (ref 1.7–7.7)
Neutrophils Relative %: 29 %
Other: 4 %
Platelets: 26 10*3/uL — CL (ref 150–400)
RBC: 2.91 MIL/uL — ABNORMAL LOW (ref 4.22–5.81)
RDW: 14.7 % (ref 11.5–15.5)
Smear Review: NORMAL
WBC: 2.6 10*3/uL — ABNORMAL LOW (ref 4.0–10.5)
nRBC: 4.6 % — ABNORMAL HIGH (ref 0.0–0.2)

## 2019-01-15 LAB — GLUCOSE, CAPILLARY: Glucose-Capillary: 92 mg/dL (ref 70–99)

## 2019-01-15 LAB — PROTIME-INR
INR: 1 (ref 0.8–1.2)
Prothrombin Time: 13.3 seconds (ref 11.4–15.2)

## 2019-01-15 MED ORDER — FENTANYL CITRATE (PF) 100 MCG/2ML IJ SOLN
INTRAMUSCULAR | Status: AC | PRN
  Administered 2019-01-15: 50 ug via INTRAVENOUS
  Administered 2019-01-15: 25 ug via INTRAVENOUS

## 2019-01-15 MED ORDER — HEPARIN SOD (PORK) LOCK FLUSH 100 UNIT/ML IV SOLN
INTRAVENOUS | Status: AC
  Filled 2019-01-15: qty 5

## 2019-01-15 MED ORDER — SODIUM CHLORIDE 0.9 % IV SOLN
INTRAVENOUS | Status: DC
  Administered 2019-01-15: 08:00:00 via INTRAVENOUS

## 2019-01-15 MED ORDER — FENTANYL CITRATE (PF) 100 MCG/2ML IJ SOLN
INTRAMUSCULAR | Status: AC
  Filled 2019-01-15: qty 2

## 2019-01-15 MED ORDER — MIDAZOLAM HCL 2 MG/2ML IJ SOLN
INTRAMUSCULAR | Status: AC | PRN
  Administered 2019-01-15: 1 mg via INTRAVENOUS

## 2019-01-15 MED ORDER — MIDAZOLAM HCL 5 MG/5ML IJ SOLN
INTRAMUSCULAR | Status: AC
  Filled 2019-01-15: qty 5

## 2019-01-15 NOTE — Telephone Encounter (Signed)
Sarah, I believe you called patient today 01/15/19.  Were you able to reach him in regards to below?   Call pt I dont see any surgery in the chart Where and with who was this done?  Why would a surgeon not give medication after a procedure? Im just confused by that.   Im covering for Mclean.    I would only feel comfortable giving him 2 days worth of percocet  Is is not to drive on this medication, operate heavy machinery or drink alcohol on this medication  If he has more pain requirements than above, then he needs an OV here to discuss.     I looked up patient on [place] Controlled Substances Reporting System and saw no activity that raised concern of inappropriate use.

## 2019-01-15 NOTE — Progress Notes (Signed)
Patient clinically stable post BMB per Dr Earleen Newport, vitals stable, although patient on 3l/min for procedure and post. rr stable, no visible distress. Alert and oriented. Eating sandwich. Denies complaints at this time. Sinus rhythm per monitor. 2x2 dresssing to lumbar/sacral spine area.

## 2019-01-15 NOTE — Telephone Encounter (Signed)
Patient was informed by surgeon to contact PCP to receive pain medication for a couple days. Patient was informed that Dr Olivia Mackie is not in the office until middle of next week. I would have to send this request to another.provider. Copied from [place] [id number]. Topic: General - Other >> Jan 15, 2019  3:47 PM Nicholas Reyes wrote: Reason for CRM: Patient called to ask that the doctor call him in something for his pain after his back surgery.  CB#  [phone].  Please call to discuss.  CB# [phone]

## 2019-01-15 NOTE — Telephone Encounter (Signed)
See below

## 2019-01-15 NOTE — Addendum Note (Signed)
Addended by: Burnard Hawthorne on: 01/15/2019 04:38 PM   Modules accepted: Orders

## 2019-01-15 NOTE — H&P (Signed)
Chief Complaint: Heme disorder, thrombocytopenia  Referring Physician(s): Yu,Zhou  Supervising Physician: Corrie Mckusick  Patient Status: [facility] - Out-pt  History of Present Illness: Nicholas Reyes is a 63 y.o. male presents with history of thrombocytopenia/neutropenia, referred for bone marrow biopsy.     Past Medical History:  Diagnosis Date  . Arthritis   . Asthma   . CAD (coronary artery disease)   . COPD (chronic obstructive pulmonary disease) ([place])   . Depression   . Diabetes mellitus   . Dyspnea    with exertion  . GERD (gastroesophageal reflux disease)   . Heart disease    cad with 3 stents   . Hypertension   . MI (myocardial infarction) ([place])    3 stents  . Migraines    migraines  . Psoriasis    joint pain in knees left hand   . Sleep apnea    cpap    Past Surgical History:  Procedure Laterality Date  . APPENDECTOMY    . CATARACT EXTRACTION Bilateral    inserted lens b/l eyes right 03/12/17, left 05/28/17   . CHOLECYSTECTOMY    . CORONARY ANGIOPLASTY WITH STENT PLACEMENT     LAD, LCX-OM, ? 3rd location  . EYE SURGERY     cataract b/l   . HEMORRHOID SURGERY    . hemorrhoid surgery      x 2   . KNEE SURGERY     right knee arthroscopy, partial synovectecomy 04/21/15 and meniscectomy/chondroplasty   . LEFT HEART CATH AND CORONARY ANGIOGRAPHY Left 01/30/2018   Procedure: LEFT HEART CATH AND CORONARY ANGIOGRAPHY;  Surgeon: Isaias Cowman, MD;  Location: [facility];  Service: Cardiovascular;  Laterality: Left;  . right knee surgery     ? type of procedure   . STENT PLACEMENT VASCULAR ([place] [facility])    . TONSILLECTOMY    . TONSILLECTOMY AND ADENOIDECTOMY      Allergies: Patient has no known allergies.  Medications: Prior to Admission medications   Medication Sig Start Date End Date Taking? Authorizing Provider  acetaminophen (TYLENOL) 500 MG tablet Take 1 tablet (500 mg total) by mouth every 6 (six) hours as needed. 09/19/18  Yes  Laban Emperor, PA-C  albuterol (PROVENTIL HFA;VENTOLIN HFA) 108 (90 Base) MCG/ACT inhaler Inhale 2 puffs into the lungs every 4 (four) hours as needed. For shortness of breath 05/14/18  Yes McLean-Scocuzza, Nino Glow, MD  amLODipine (NORVASC) 5 MG tablet Take 5 mg by mouth daily.   Yes [provider]  aspirin 81 MG EC tablet Take 81 mg by mouth daily.    Yes [provider]  atorvastatin (LIPITOR) 20 MG tablet Take 1 tablet (20 mg total) by mouth daily at 6 PM. 10/03/18  Yes McLean-Scocuzza, Nino Glow, MD  budesonide-formoterol (SYMBICORT) 160-4.5 MCG/ACT inhaler Inhale 2 puffs into the lungs 2 (two) times daily. Rinse mouth 05/14/18  Yes McLean-Scocuzza, Nino Glow, MD  cetirizine (ZYRTEC) 10 MG tablet Take 10 mg by mouth daily.   Yes [provider]  Cholecalciferol 50000 units capsule Take 1 capsule (50,000 Units total) by mouth once a week. 12/10/17  Yes McLean-Scocuzza, Nino Glow, MD  clobetasol ointment (TEMOVATE) 1.66 % Apply 1 application topically 2 (two) times daily. Right ear as needed not face or private 04/10/18  Yes McLean-Scocuzza, Nino Glow, MD  clotrimazole (LOTRIMIN) 1 % cream Apply 1 application topically 2 (two) times daily. 09/20/18  Yes McLean-Scocuzza, Nino Glow, MD  diclofenac (FLECTOR) 1.3 % PTCH Place 1 patch  onto the skin 2 (two) times daily. 11/30/17  Yes McLean-Scocuzza, Nino Glow, MD  dicyclomine (BENTYL) 10 MG capsule Take 1 capsule (10 mg total) by mouth 4 (four) times daily -  before meals and at bedtime. 01/08/19  Yes Jonathon Bellows, MD  DULoxetine (CYMBALTA) 60 MG capsule Take 1 capsule (60 mg total) by mouth daily. 01/11/18  Yes McLean-Scocuzza, Nino Glow, MD  fluticasone (FLONASE) 50 MCG/ACT nasal spray Place 2 sprays into both nostrils daily. 05/14/18  Yes McLean-Scocuzza, Nino Glow, MD  furosemide (LASIX) 40 MG tablet Take 1 tablet (40 mg total) by mouth 2 (two) times daily. In am and at lunch 07/10/18  Yes McLean-Scocuzza, Nino Glow, MD  glucose blood test strip 1 each by  Other route 3 (three) times daily as needed. DX- E11.9 Diabetes Mellitus type 2. 11/06/18  Yes McLean-Scocuzza, Nino Glow, MD  hydrocortisone 2.5 % cream Apply topically 2 (two) times daily. Left face left of eye 07/10/18  Yes McLean-Scocuzza, Nino Glow, MD  hydrOXYzine (ATARAX/VISTARIL) 25 MG tablet Take 1 tablet (25 mg total) by mouth 3 (three) times daily as needed. 05/14/18  Yes McLean-Scocuzza, Nino Glow, MD  isosorbide mononitrate (IMDUR) 30 MG 24 hr tablet Take 1 tablet (30 mg total) by mouth daily. 01/11/18  Yes McLean-Scocuzza, Nino Glow, MD  ketorolac (ACULAR) 0.5 % ophthalmic solution  12/03/17  Yes [provider]  meloxicam (MOBIC) 7.5 MG tablet Take 1 tablet (7.5 mg total) by mouth 2 (two) times daily as needed for pain. 07/07/18  Yes McLean-Scocuzza, Nino Glow, MD  metFORMIN (GLUCOPHAGE) 500 MG tablet Take 1 tablet (500 mg total) by mouth daily with breakfast. 11/23/17  Yes McLean-Scocuzza, Nino Glow, MD  metoprolol tartrate (LOPRESSOR) 25 MG tablet Take 25 mg by mouth 2 (two) times daily.   Yes [provider]  montelukast (SINGULAIR) 10 MG tablet Take 10 mg by mouth at bedtime.   Yes [provider]  mupirocin ointment (BACTROBAN) 2 % Apply 1 application topically 2 (two) times daily. Any open sores 07/10/18  Yes McLean-Scocuzza, Nino Glow, MD  potassium chloride SA (K-DUR,KLOR-CON) 20 MEQ tablet Take 1 tablet (20 mEq total) by mouth daily. 07/10/18  Yes McLean-Scocuzza, Nino Glow, MD  spironolactone (ALDACTONE) 25 MG tablet Take 1 tablet (25 mg total) by mouth daily. In am for blood pressure 05/03/18  Yes McLean-Scocuzza, Nino Glow, MD  nitroGLYCERIN (NITROSTAT) 0.4 MG SL tablet Place 0.4 mg under the tongue every 5 (five) minutes as needed. For chest pain    [provider]  omeprazole (PRILOSEC) 40 MG capsule Take 1 capsule (40 mg total) by mouth daily. Patient not taking: Reported on 01/08/2019 06/26/18 08/27/18  Jonathon Bellows, MD  ondansetron (ZOFRAN) 4 MG tablet Take 1 tablet (4 mg  total) by mouth every 8 (eight) hours as needed for nausea or vomiting. Patient not taking: Reported on 01/08/2019 12/19/17   Jodelle Green, FNP  oxyCODONE-acetaminophen (PERCOCET) 5-325 MG tablet Take 1 tablet by mouth 2 (two) times daily as needed for severe pain. Patient not taking: Reported on 12/19/2018 09/20/18   McLean-Scocuzza, Nino Glow, MD  pantoprazole ([place]) 20 MG tablet TAKE 1 TABLET EVERY DAY Patient not taking: Reported on 01/08/2019 04/29/18   Burnard Hawthorne, FNP  prednisoLONE acetate (PRED FORTE) 1 % ophthalmic suspension  11/30/17   [provider]  Secukinumab (COSENTYX 300 DOSE [place]) Inject into the skin every 30 (thirty) days.    [provider]  sucralfate (CARAFATE) 1 g tablet Take 1  tablet (1 g total) by mouth 4 (four) times daily -  with meals and at bedtime. Patient not taking: Reported on 12/19/2018 02/24/18 02/24/19  Merlyn Lot, MD  sucralfate (CARAFATE) 1 GM/10ML suspension Take 10 mLs (1 g total) by mouth 4 (four) times daily. Patient not taking: Reported on 01/08/2019 01/02/19 04/02/19  Jonathon Bellows, MD  sulfamethoxazole-trimethoprim (BACTRIM DS) 800-160 MG tablet Take 1 tablet by mouth 2 (two) times daily. 10/08/18   Cuthriell, Charline Bills, PA-C  triamcinolone cream (KENALOG) 0.1 % Apply 1 application topically 2 (two) times daily. Not face, groin, underarms 11/15/17   McLean-Scocuzza, Nino Glow, MD     Family History  Problem Relation Age of Onset  . Cancer Mother   . Heart disease Father   . Stroke Father   . Heart disease Brother     Social History   Socioeconomic History  . Marital status: Single    Spouse name: Not on file  . Number of children: 0  . Years of education: Not on file  . Highest education level: Not on file  Occupational History  . Not on file  Social Needs  . Financial resource strain: Not on file  . Food insecurity    Worry: Not on file    Inability: Not on file  . Transportation needs    Medical: Not on file     Non-medical: Not on file  Tobacco Use  . Smoking status: Former Smoker    Years: 5.00    Types: Cigarettes  . Smokeless tobacco: Former Systems developer    Quit date: 12/19/2015  . Tobacco comment: 3-4 cig every couple days  Substance and Sexual Activity  . Alcohol use: No    Frequency: Never  . Drug use: No  . Sexual activity: Not on file  Lifestyle  . Physical activity    Days per week: Not on file    Minutes per session: Not on file  . Stress: Not on file  Relationships  . Social Herbalist on phone: Not on file    Gets together: Not on file    Attends religious service: Not on file    Active member of club or organization: Not on file    Attends meetings of clubs or organizations: Not on file    Relationship status: Not on file  Other Topics Concern  . Not on file  Social History Narrative   Former smoker quit 2013/2014 used to smoke<1 ppd x 2-3 years    No kids   Single not sexually active    Unable to read/write    Has dogs and cats at home    No guns   Wears seat belt        Review of Systems: A 12 point ROS discussed and pertinent positives are indicated in the HPI above.  All other systems are negative.  Review of Systems  Vital Signs: BP 139/80 (BP Location: Right Arm)   Pulse 86   Temp 98.3 F (36.8 C) (Oral)   Resp (!) 27   Ht 5' 11"  (1.803 m)   Wt 108.9 kg   SpO2 90%   BMI 33.47 kg/m   Physical Exam  General: 63 yo male appearing older than stated age.  Well-developed, well-nourished.  No distress. HEENT: Atraumatic, normocephalic.  Conjugate gaze, extra-ocular motor intact. No scleral icterus or scleral injection. No lesions on external ears, nose, lips, or gums.  Oral mucosa moist, pink.  Neck: Symmetric with no goiter  enlargement.  Chest/Lungs:  Symmetric chest with inspiration/expiration.  No labored breathing.  Clear to auscultation with no wheezes, rhonchi, or rales.  Heart:  RRR, with no third heart sounds appreciated. No JVD appreciated.   Abdomen:  Soft, NT/ND, with + bowel sounds.   Genito-urinary: Deferred Neurologic: Alert & Oriented to person, place, and time.   Normal affect and insight.  Appropriate questions.  Moving all 4 extremities with gross sensory intact.      Imaging: Dg Chest Port 1 View  Result Date: 12/31/2018 CLINICAL DATA:  Upper abdominal pain. EXAM: PORTABLE CHEST 1 VIEW COMPARISON:  Radiographs of August 05, 2018. FINDINGS: Stable cardiomediastinal silhouette. No pneumothorax or pleural effusion is noted. Both lungs are clear. The visualized skeletal structures are unremarkable. IMPRESSION: No active disease. Electronically Signed   By: Marijo Conception M.D.   On: 12/31/2018 14:26    Labs:  CBC: Recent Labs    12/17/18 1645 12/23/18 1056 12/31/18 1108 01/15/19 0807  WBC 2.8* 2.7* 2.6* 2.6*  HGB 13.8 12.1* 11.1* 9.8*  HCT 41.3 37.7* 34.8* 29.4*  PLT 55* 47* 40* 26*    COAGS: Recent Labs    12/12/18 1242 12/17/18 1645 01/15/19 0807  INR 0.9 1.0 1.0  APTT 31 26  --     BMP: Recent Labs    08/05/18 0223 10/10/18 1314 12/12/18 1242 12/31/18 1108  NA 138 136 139 140  K 4.0 3.7 3.7 3.8  CL 100 94* 98 100  CO2 30 31 34* 31  GLUCOSE 136* 78 116* 160*  BUN 16 18 10 12   CALCIUM 8.2* 8.4* 8.8* 8.6*  CREATININE 0.69 1.05 0.70 0.76  GFRNONAA >60 >60 >60 >60  GFRAA >60 >60 >60 >60    LIVER FUNCTION TESTS: Recent Labs    07/27/18 0504 08/05/18 0223 10/10/18 1314 12/17/18 1645 12/31/18 1108  BILITOT 0.4 0.5 0.5 0.6 0.7  AST 12* 18 15 13 19   ALT 12 16 19 11 17   ALKPHOS 50 51 70  --  52  PROT 6.5 5.8* 6.9 5.5* 6.2*  ALBUMIN 3.5 3.3* 3.8  --  3.4*    TUMOR MARKERS: No results for input(s): AFPTM, CEA, CA199, CHROMGRNA in the last 8760 hours.  Assessment and Plan:  63 yo male presents for bone marrow biopsy.   Risks and benefits of bone marrow bx was discussed with the patient and/or patient's family including, but not limited to bleeding, infection, damage to adjacent  structures or low yield requiring additional tests.  All of the questions were answered and there is agreement to proceed.  Consent signed and in chart.    Thank you for this interesting consult.  I greatly enjoyed meeting Nicholas Reyes and look forward to participating in their care.  A copy of this report was sent to the requesting provider on this date.  Electronically Signed: Corrie Mckusick, DO 01/15/2019, 9:26 AM   I spent a total of  40 Minutes   in face to face in clinical consultation, greater than 50% of which was counseling/coordinating care for bone marrow biopsy

## 2019-01-15 NOTE — Telephone Encounter (Signed)
Call pt I dont see any surgery in the chart Where and with who was this done?  Why would a surgeon not give medication after a procedure? Im just confused by that.   Im covering for Mclean.    I would only feel comfortable giving him 2 days worth of percocet  Is is not to drive on this medication, operate heavy machinery or drink alcohol on this medication  If he has more pain requirements than above, then he needs an OV here to discuss.     I looked up patient on [place] Controlled Substances Reporting System and saw no activity that raised concern of inappropriate use.

## 2019-01-15 NOTE — Telephone Encounter (Signed)
LM asking patient to call back in regards to message about refill of pain medication.

## 2019-01-15 NOTE — Procedures (Signed)
Interventional Radiology Procedure Note  Procedure: CT guided aspirate and core biopsy of right posteior iliac bone 1 core of ~2cm acquired. Given patient discomfort and single episode of hypoxia we terminated   Complications: None.  Patient had episode of hypoxia to ~80-85% with initiation of mod sedation, which responded immediately to stimulation with the procedure.   Recommendations: - Bedrest supine x 1 hrs - OTC's PRN  Pain - Follow biopsy results  Signed,  Dulcy Fanny. Earleen Newport, DO

## 2019-01-15 NOTE — Addendum Note (Signed)
Addended by: Burnard Hawthorne on: 01/15/2019 04:56 PM   Modules accepted: Orders

## 2019-01-16 ENCOUNTER — Telehealth: Payer: Self-pay | Admitting: Internal Medicine

## 2019-01-16 ENCOUNTER — Encounter: Payer: Self-pay | Admitting: *Deleted

## 2019-01-16 ENCOUNTER — Telehealth: Payer: Self-pay | Admitting: *Deleted

## 2019-01-16 NOTE — Telephone Encounter (Signed)
Unable to leave message for patient to return call back. [facility] may give results and obtain information.  

## 2019-01-16 NOTE — Telephone Encounter (Signed)
margarets note: Call pt I dont see any surgery in the chart Where and with who was this done?  Why would a surgeon not give medication after a procedure? Im just confused by that.   Im covering for Mclean.    I would only feel comfortable giving him 2 days worth of percocet  Is is not to drive on this medication, operate heavy machinery or drink alcohol on this medication  If he has more pain requirements than above, then he needs an OV here to discuss.      I do not want to prescribe him any narcotics. My advise is to call surgeon who did procedure

## 2019-01-16 NOTE — Telephone Encounter (Signed)
It looks like the last ones were sent 5/15 by Dr. Olivia Mackie? Do you see something I don't?

## 2019-01-16 NOTE — Telephone Encounter (Signed)
Patient is calling because he states that his medication for pain was not called in [place] on [address].

## 2019-01-16 NOTE — Telephone Encounter (Signed)
Pt return call and message given to him regarding pain medication. He stated that the surgeon did not prescribe any pain medicine. Advised him to notify the surgeon for pain medication. He would like a call back and will be by his phone.

## 2019-01-16 NOTE — Telephone Encounter (Signed)
This encounter was created in error - please disregard.

## 2019-01-16 NOTE — Telephone Encounter (Signed)
Since I have not seen him recently and we did not do the procedure I cannot prescribe narcotics to him   I would encourage him to reach out to surgeon in regard to his pain for follow up/advice

## 2019-01-16 NOTE — Telephone Encounter (Signed)
Looks like margaret sent a few in yesterday?  I will not send in more  Needs to reach out to his surgeon

## 2019-01-16 NOTE — Telephone Encounter (Signed)
Pt calling, states he was told  By "Someone at practice they would call in a  few pain meds"  S/P surgery. Note from L. Guse would not call in narcotics as has not seen pt and to have pt call surgeon.    Reiterated need to speak with surgeon. Pt hung up.

## 2019-01-17 ENCOUNTER — Other Ambulatory Visit: Admission: RE | Admit: 2019-01-17 | Payer: Medicare HMO | Source: Ambulatory Visit

## 2019-01-17 ENCOUNTER — Telehealth: Payer: Self-pay

## 2019-01-17 NOTE — Telephone Encounter (Signed)
Called Pt and spoke to him. I told Pt we cannot give him pain medication, whomever the surgeon was can give him a Rx for pain. Pt stated he was having weakness and chills I told Pt to go to Urgent Care or the Hospital ER, Pt stated ok if he doesn't get any better he will.

## 2019-01-17 NOTE — Telephone Encounter (Signed)
Called the office three times for assistance. No answer.  The patient is yelling and used explicative language at the [facility] agent after the message was read to him by Fransisco Beau. Patient states that a provider called him yesterday and states that they would call him in some pain medication.  However, the message was read to the patient that he should return to the surgeon for pain medication.  Patient states that the surgeon advised him to reach out to the PCP.

## 2019-01-17 NOTE — Telephone Encounter (Signed)
I can loop margaret in, but I am not comfortable sending in narcotics for him  Joycelyn Schmid --- he called on Wednesday, asking about a few percocet to be called in and there was a note that you may be comfortable sending a few in? I have encouraged him to call the surgeon as we have not seen him here recently

## 2019-01-17 NOTE — Telephone Encounter (Signed)
Lauren,  I agree with you. I was under the impression he had a surgery, which he didn't and we cannot see any notes who performed.   I agree that he may call person whom performed procedure as they know what procedure it was and better how to manage pain.   Sharee Pimple, you may communicate that patient that we had a call that he had a surgery and I asked sarah to get more information prior to calling in pain medications.   Again, patient needs to call person who performed procedure or make visit here if he is having pain.   FYI Mclean.

## 2019-01-17 NOTE — Telephone Encounter (Signed)
Called Pt back No answer VM full, cannot leave a message.

## 2019-01-17 NOTE — Telephone Encounter (Signed)
Copied from [place] [id number]. Topic: General - Other >> Jan 17, 2019  1:41 PM Burchel, Abbi R wrote: Reason for CRM: Pt returning call to office.  Please call pt.

## 2019-01-17 NOTE — Telephone Encounter (Signed)
Called Pt No answer No VM, will try back later 

## 2019-01-17 NOTE — Telephone Encounter (Signed)
Patient is returning a call to Annabella regarding pain medication.  He states he is in very bad pain and needs something to relieve the pain.  CB# [phone]

## 2019-01-17 NOTE — Telephone Encounter (Signed)
FYI Pt calling, states he was told  By "Someone at practice they would call in a  few pain meds"  S/P surgery. Note from L. Guse would not call in narcotics as has not seen pt and to have pt call surgeon.    Reiterated need to speak with surgeon. Pt hung up.

## 2019-01-17 NOTE — Telephone Encounter (Signed)
Yes if he is having fever, chills, weakness -- needs eval right away  ER or urgent care

## 2019-01-17 NOTE — Telephone Encounter (Signed)
Unable to leave message for patient to return call back, vm box full. PEC may give information. We can not give him any pain medication. The surgeon has to give rx due to the surgery. Dr Olivia Mackie is out of office until next week. No other provider will give.

## 2019-01-20 ENCOUNTER — Other Ambulatory Visit: Payer: Self-pay

## 2019-01-20 ENCOUNTER — Inpatient Hospital Stay: Payer: Medicare HMO | Attending: Oncology | Admitting: Oncology

## 2019-01-20 ENCOUNTER — Telehealth: Payer: Self-pay | Admitting: Oncology

## 2019-01-20 ENCOUNTER — Encounter: Payer: Self-pay | Admitting: Oncology

## 2019-01-20 ENCOUNTER — Telehealth: Payer: Self-pay

## 2019-01-20 VITALS — BP 136/69 | HR 90 | Temp 99.0°F | Resp 20 | Wt 228.5 lb

## 2019-01-20 DIAGNOSIS — C9200 Acute myeloblastic leukemia, not having achieved remission: Secondary | ICD-10-CM

## 2019-01-20 DIAGNOSIS — D696 Thrombocytopenia, unspecified: Secondary | ICD-10-CM

## 2019-01-20 DIAGNOSIS — J449 Chronic obstructive pulmonary disease, unspecified: Secondary | ICD-10-CM | POA: Insufficient documentation

## 2019-01-20 DIAGNOSIS — D539 Nutritional anemia, unspecified: Secondary | ICD-10-CM | POA: Diagnosis not present

## 2019-01-20 DIAGNOSIS — Z7982 Long term (current) use of aspirin: Secondary | ICD-10-CM | POA: Insufficient documentation

## 2019-01-20 DIAGNOSIS — R634 Abnormal weight loss: Secondary | ICD-10-CM | POA: Insufficient documentation

## 2019-01-20 DIAGNOSIS — Z955 Presence of coronary angioplasty implant and graft: Secondary | ICD-10-CM | POA: Diagnosis not present

## 2019-01-20 DIAGNOSIS — E785 Hyperlipidemia, unspecified: Secondary | ICD-10-CM | POA: Diagnosis not present

## 2019-01-20 DIAGNOSIS — D709 Neutropenia, unspecified: Secondary | ICD-10-CM | POA: Diagnosis not present

## 2019-01-20 DIAGNOSIS — Z79899 Other long term (current) drug therapy: Secondary | ICD-10-CM | POA: Insufficient documentation

## 2019-01-20 DIAGNOSIS — Z87891 Personal history of nicotine dependence: Secondary | ICD-10-CM | POA: Insufficient documentation

## 2019-01-20 DIAGNOSIS — I252 Old myocardial infarction: Secondary | ICD-10-CM | POA: Diagnosis not present

## 2019-01-20 DIAGNOSIS — R0602 Shortness of breath: Secondary | ICD-10-CM | POA: Insufficient documentation

## 2019-01-20 DIAGNOSIS — Z7984 Long term (current) use of oral hypoglycemic drugs: Secondary | ICD-10-CM | POA: Diagnosis not present

## 2019-01-20 DIAGNOSIS — R0902 Hypoxemia: Secondary | ICD-10-CM | POA: Diagnosis not present

## 2019-01-20 DIAGNOSIS — I10 Essential (primary) hypertension: Secondary | ICD-10-CM | POA: Diagnosis not present

## 2019-01-20 DIAGNOSIS — D61818 Other pancytopenia: Secondary | ICD-10-CM | POA: Diagnosis not present

## 2019-01-20 DIAGNOSIS — Z7951 Long term (current) use of inhaled steroids: Secondary | ICD-10-CM | POA: Diagnosis not present

## 2019-01-20 DIAGNOSIS — K219 Gastro-esophageal reflux disease without esophagitis: Secondary | ICD-10-CM | POA: Diagnosis not present

## 2019-01-20 DIAGNOSIS — R1010 Upper abdominal pain, unspecified: Secondary | ICD-10-CM | POA: Diagnosis not present

## 2019-01-20 DIAGNOSIS — I2510 Atherosclerotic heart disease of native coronary artery without angina pectoris: Secondary | ICD-10-CM | POA: Insufficient documentation

## 2019-01-20 DIAGNOSIS — Z791 Long term (current) use of non-steroidal anti-inflammatories (NSAID): Secondary | ICD-10-CM | POA: Diagnosis not present

## 2019-01-20 DIAGNOSIS — Z8249 Family history of ischemic heart disease and other diseases of the circulatory system: Secondary | ICD-10-CM | POA: Insufficient documentation

## 2019-01-20 DIAGNOSIS — E119 Type 2 diabetes mellitus without complications: Secondary | ICD-10-CM | POA: Insufficient documentation

## 2019-01-20 DIAGNOSIS — R630 Anorexia: Secondary | ICD-10-CM | POA: Insufficient documentation

## 2019-01-20 DIAGNOSIS — R5382 Chronic fatigue, unspecified: Secondary | ICD-10-CM | POA: Diagnosis not present

## 2019-01-20 NOTE — Progress Notes (Signed)
Patient was seen by MD in office today

## 2019-01-20 NOTE — Telephone Encounter (Signed)
Records faxed to Dr. Merri Ray office for upcoming appt on 01/23/19 @ 2:30

## 2019-01-20 NOTE — Telephone Encounter (Signed)
Attempted to call patient and also his sister to communicate bone marrow biopsy results. I was not able to reach any of them.

## 2019-01-20 NOTE — Progress Notes (Signed)
Hematology/Oncology Consult note [facility] Telephone:([phone] [phone]   Patient Care Team: McLean-Scocuzza, Nino Glow, MD as PCP - General (Internal Medicine) Leona Singleton, RN as [place] Management  REFERRING PROVIDER: McLean-Scocuzza, Olivia Mackie *  CHIEF COMPLAINTS/REASON FOR VISIT:  Evaluation of leukopenia and thrombocytopenia  HISTORY OF PRESENTING ILLNESS:   Nicholas Reyes is a  63 y.o.  male with PMH listed below was seen in consultation at the request of  McLean-Scocuzza, Olivia Mackie *  for evaluation of leukopenia and thrombocytopenia Patient recently had blood work done at the primary care provider's office. 12/12/2018 CBC showed WBC 2.9, platelet count 64,000. 12/17/2018, WBC 2.8, predominantly neutropenia ANC 1.19 platelet count 55,000. Normal LDH, normal folic acid and X91 level, normal PSA normal uric acid level. Negative HIV negative  Path review showed a few lymphocytes.  Reactive.  No immature cells are identified.  Favor reactive process.  Thrombocytopenia no platelet clumps identified. 11/15/2017, hepatitis C antibody negative, hepatitis B surface antigen negative.  Patient is a poor historian.  He has no complaints today.  Denies fever, chills, unintentional weight loss, night sweating.  Chronic fatigue and shortness of breath.    INTERVAL HISTORY Nicholas Reyes is a 63 y.o. male who has above history reviewed by me today presents for follow up visit for management of pancytopenia. Problems and complaints are listed below: Patient was last seen by me on 12/19/2018 for work-up of leukopenia and thrombocytopenia. Patient left the clinic without having blood work done.  And our scheduler has been having difficulty reaching out to him and reschedule blood work and other procedures.. Patient eventually have the blood work done which was reviewed by me. CBC showed mild anemia with hemoglobin of 12.1, leukopenia with ANC  0.8, platelet count 47,000 Peripheral smear showed 8% blasts, possible MDS versus acute leukemia. Patient was contacted and recommended to proceed with CT-guided bone marrow biopsy.   Patient presented to emergency room on 12/31/2018 reporting left upper lobe pain for 2 days. Patient also establish care with gastroenterology for chronic abdominal pain. Was seen by Dr. Vicente Males on 01/08/2019 and was recommended to have EGD and colonoscopy.  Recommend patient to continue PPI.  Bentyl 4 times daily as needed for pain. Patient eventually got bone marrow biopsy done on 01/15/2019. Today he present to discuss results.  Patient reports feeling tired and fatigued.  Decrease of appetite.  He has lost 2 pounds since 12/19/2018. Denies any fever, chills, nausea, vomiting.  Patient has chronic upper abdominal pain and has been advised to stop NSAID use. Has a history of CAD with stents placement.  He follows up with cardiology Dr. Saralyn Pilar Patient had a catheterization on 01/30/2018 which reviewed normal left ventricular function and insignificant coronary artery disease, patent stents to the proximal and mid LAD, and OM1 Patient has aspirin listed on his medication list.  He reports that he is not taking it because he ran out of it.   Review of Systems  Constitutional: Positive for appetite change, fatigue and unexpected weight change. Negative for chills and fever.  HENT:   Negative for hearing loss and voice change.   Eyes: Negative for eye problems and icterus.  Respiratory: Negative for chest tightness, cough and shortness of breath.   Cardiovascular: Negative for chest pain and leg swelling.  Gastrointestinal: Positive for abdominal pain. Negative for abdominal distention.  Endocrine: Negative for hot flashes.  Genitourinary: Negative for difficulty urinating, dysuria and frequency.   Musculoskeletal: Negative  for arthralgias.  Skin: Negative for itching and rash.  Neurological: Negative for  light-headedness and numbness.  Hematological: Negative for adenopathy. Bruises/bleeds easily.  Psychiatric/Behavioral: Negative for confusion.     MEDICAL HISTORY:  Past Medical History:  Diagnosis Date   Arthritis    Asthma    CAD (coronary artery disease)    COPD (chronic obstructive pulmonary disease) (HCC)    Depression    Diabetes mellitus    Dyspnea    with exertion   GERD (gastroesophageal reflux disease)    Heart disease    cad with 3 stents    Hypertension    MI (myocardial infarction) ([place])    3 stents   Migraines    migraines   Psoriasis    joint pain in knees left hand    Sleep apnea    cpap    SURGICAL HISTORY: Past Surgical History:  Procedure Laterality Date   APPENDECTOMY     CATARACT EXTRACTION Bilateral    inserted lens b/l eyes right 03/12/17, left 05/28/17    CHOLECYSTECTOMY     CORONARY ANGIOPLASTY WITH STENT PLACEMENT     LAD, LCX-OM, ? 3rd location   EYE SURGERY     cataract b/l    HEMORRHOID SURGERY     hemorrhoid surgery      x 2    KNEE SURGERY     right knee arthroscopy, partial synovectecomy 04/21/15 and meniscectomy/chondroplasty    LEFT HEART CATH AND CORONARY ANGIOGRAPHY Left 01/30/2018   Procedure: LEFT HEART CATH AND CORONARY ANGIOGRAPHY;  Surgeon: Isaias Cowman, MD;  Location: [facility];  Service: Cardiovascular;  Laterality: Left;   right knee surgery     ? type of procedure    STENT PLACEMENT VASCULAR ([place] HX)     TONSILLECTOMY     TONSILLECTOMY AND ADENOIDECTOMY      SOCIAL HISTORY: Social History   Socioeconomic History   Marital status: Single    Spouse name: Not on file   Number of children: 0   Years of education: Not on file   Highest education level: Not on file  Occupational History   Not on file  Social Needs   Financial resource strain: Not on file   Food insecurity    Worry: Not on file    Inability: Not on file   Transportation needs    Medical:  Not on file    Non-medical: Not on file  Tobacco Use   Smoking status: Former Smoker    Years: 5.00    Types: Cigarettes   Smokeless tobacco: Former Systems developer    Quit date: 12/19/2015   Tobacco comment: 3-4 cig every couple days  Substance and Sexual Activity   Alcohol use: No    Frequency: Never   Drug use: No   Sexual activity: Not on file  Lifestyle   Physical activity    Days per week: Not on file    Minutes per session: Not on file   Stress: Not on file  Relationships   Social connections    Talks on phone: Not on file    Gets together: Not on file    Attends religious service: Not on file    Active member of club or organization: Not on file    Attends meetings of clubs or organizations: Not on file    Relationship status: Not on file   Intimate partner violence    Fear of current or ex partner: Not on file  Emotionally abused: Not on file    Physically abused: Not on file    Forced sexual activity: Not on file  Other Topics Concern   Not on file  Social History Narrative   Former smoker quit 2013/2014 used to smoke<1 ppd x 2-3 years    No kids   Single not sexually active    Unable to read/write    Has dogs and cats at home    No guns   Wears seat belt     FAMILY HISTORY: Family History  Problem Relation Age of Onset   Cancer Mother    Heart disease Father    Stroke Father    Heart disease Brother     ALLERGIES:  has No Known Allergies.  MEDICATIONS:  Current Outpatient Medications  Medication Sig Dispense Refill   acetaminophen (TYLENOL) 500 MG tablet Take 1 tablet (500 mg total) by mouth every 6 (six) hours as needed. 30 tablet 0   albuterol (PROVENTIL HFA;VENTOLIN HFA) 108 (90 Base) MCG/ACT inhaler Inhale 2 puffs into the lungs every 4 (four) hours as needed. For shortness of breath 3 Inhaler 4   amLODipine (NORVASC) 5 MG tablet Take 5 mg by mouth daily.     aspirin 81 MG EC tablet Take 81 mg by mouth daily.      atorvastatin  (LIPITOR) 20 MG tablet Take 1 tablet (20 mg total) by mouth daily at 6 PM. 90 tablet 3   budesonide-formoterol (SYMBICORT) 160-4.5 MCG/ACT inhaler Inhale 2 puffs into the lungs 2 (two) times daily. Rinse mouth 1 Inhaler 12   cetirizine (ZYRTEC) 10 MG tablet Take 10 mg by mouth daily.     Cholecalciferol 50000 units capsule Take 1 capsule (50,000 Units total) by mouth once a week. 13 capsule 1   clobetasol ointment (TEMOVATE) 3.33 % Apply 1 application topically 2 (two) times daily. Right ear as needed not face or private 45 g 0   clotrimazole (LOTRIMIN) 1 % cream Apply 1 application topically 2 (two) times daily. 60 g 11   diclofenac (FLECTOR) 1.3 % PTCH Place 1 patch onto the skin 2 (two) times daily. 60 patch 2   dicyclomine (BENTYL) 10 MG capsule Take 1 capsule (10 mg total) by mouth 4 (four) times daily -  before meals and at bedtime. 90 capsule 0   DULoxetine (CYMBALTA) 60 MG capsule Take 1 capsule (60 mg total) by mouth daily. 90 capsule 3   fluticasone (FLONASE) 50 MCG/ACT nasal spray Place 2 sprays into both nostrils daily. 16 g 6   furosemide (LASIX) 40 MG tablet Take 1 tablet (40 mg total) by mouth 2 (two) times daily. In am and at lunch 60 tablet 5   glucose blood test strip 1 each by Other route 3 (three) times daily as needed. DX- E11.9 Diabetes Mellitus type 2. 420 each 2   hydrocortisone 2.5 % cream Apply topically 2 (two) times daily. Left face left of eye 60 g 0   hydrOXYzine (ATARAX/VISTARIL) 25 MG tablet Take 1 tablet (25 mg total) by mouth 3 (three) times daily as needed. 30 tablet 0   isosorbide mononitrate (IMDUR) 30 MG 24 hr tablet Take 1 tablet (30 mg total) by mouth daily. 90 tablet 3   ketorolac (ACULAR) 0.5 % ophthalmic solution      metFORMIN (GLUCOPHAGE) 500 MG tablet Take 1 tablet (500 mg total) by mouth daily with breakfast. 90 tablet 3   metoprolol tartrate (LOPRESSOR) 25 MG tablet Take 25 mg by mouth 2 (  two) times daily.     montelukast (SINGULAIR)  10 MG tablet Take 10 mg by mouth at bedtime.     mupirocin ointment (BACTROBAN) 2 % Apply 1 application topically 2 (two) times daily. Any open sores 60 g 2   nitroGLYCERIN (NITROSTAT) 0.4 MG SL tablet Place 0.4 mg under the tongue every 5 (five) minutes as needed. For chest pain     potassium chloride SA (K-DUR,KLOR-CON) 20 MEQ tablet Take 1 tablet (20 mEq total) by mouth daily. 90 tablet 1   prednisoLONE acetate (PRED FORTE) 1 % ophthalmic suspension      Secukinumab (COSENTYX 300 DOSE [place]) Inject into the skin every 30 (thirty) days.     spironolactone (ALDACTONE) 25 MG tablet Take 1 tablet (25 mg total) by mouth daily. In am for blood pressure 90 tablet 3   triamcinolone cream (KENALOG) 0.1 % Apply 1 application topically 2 (two) times daily. Not face, groin, underarms 454 g 11   omeprazole (PRILOSEC) 40 MG capsule Take 1 capsule (40 mg total) by mouth daily. (Patient not taking: Reported on 01/08/2019) 90 capsule 3   ondansetron (ZOFRAN) 4 MG tablet Take 1 tablet (4 mg total) by mouth every 8 (eight) hours as needed for nausea or vomiting. (Patient not taking: Reported on 01/08/2019) 20 tablet 0   oxyCODONE-acetaminophen (PERCOCET) 5-325 MG tablet Take 1 tablet by mouth 2 (two) times daily as needed for severe pain. (Patient not taking: Reported on 12/19/2018) 10 tablet 0   pantoprazole (PROTONIX) 20 MG tablet TAKE 1 TABLET EVERY DAY (Patient not taking: Reported on 01/08/2019) 90 tablet 0   sucralfate (CARAFATE) 1 g tablet Take 1 tablet (1 g total) by mouth 4 (four) times daily -  with meals and at bedtime. (Patient not taking: Reported on 12/19/2018) 120 tablet 0   sucralfate (CARAFATE) 1 GM/10ML suspension Take 10 mLs (1 g total) by mouth 4 (four) times daily. (Patient not taking: Reported on 01/08/2019) 1200 mL 2   No current facility-administered medications for this visit.      PHYSICAL EXAMINATION: ECOG PERFORMANCE STATUS: 1 - Symptomatic but completely ambulatory Vitals:    01/20/19 1049  BP: 136/69  Pulse: 90  Resp: 20  Temp: 99 F (37.2 C)   Filed Weights   01/20/19 1049  Weight: 228 lb 8 oz (103.6 kg)   Physical Exam  Constitutional: He is oriented to person, place, and time. No distress.  HENT:  Head: Normocephalic and atraumatic.  Mouth/Throat: No oropharyngeal exudate.  Eyes: Pupils are equal, round, and reactive to light. EOM are normal. No scleral icterus.  Neck: Normal range of motion. Neck supple.  Cardiovascular: Normal rate and regular rhythm.  No murmur heard. Pulmonary/Chest: Effort normal. No respiratory distress.  Abdominal: Soft. Bowel sounds are normal. He exhibits no distension.  Limited abdominal exam due to obesity.  Musculoskeletal: Normal range of motion.        General: No edema.  Neurological: He is alert and oriented to person, place, and time. He exhibits normal muscle tone.  Skin: Skin is warm and dry. He is not diaphoretic. No erythema.  Bruises.  Psychiatric: Affect normal.       LABORATORY DATA:  I have reviewed the data as listed Lab Results  Component Value Date   WBC 2.6 (L) 01/15/2019   HGB 9.8 (L) 01/15/2019   HCT 29.4 (L) 01/15/2019   MCV 101.0 (H) 01/15/2019   PLT 26 (LL) 01/15/2019   Recent Labs    08/05/18 0223  10/10/18 1314 12/12/18 1242 12/17/18 1645 12/31/18 1108  NA 138 136 139  --  140  K 4.0 3.7 3.7  --  3.8  CL 100 94* 98  --  100  CO2 30 31 34*  --  31  GLUCOSE 136* 78 116*  --  160*  BUN 16 18 10   --  12  CREATININE 0.69 1.05 0.70  --  0.76  CALCIUM 8.2* 8.4* 8.8*  --  8.6*  GFRNONAA >60 >60 >60  --  >60  GFRAA >60 >60 >60  --  >60  PROT 5.8* 6.9  --  5.5* 6.2*  ALBUMIN 3.3* 3.8  --   --  3.4*  AST 18 15  --  13 19  ALT 16 19  --  11 17  ALKPHOS 51 70  --   --  52  BILITOT 0.5 0.5  --  0.6 0.7  BILIDIR  --   --   --  0.1  --   IBILI  --   --   --  0.5  --    Iron/TIBC/Ferritin/ %Sat No results found for: IRON, TIBC, FERRITIN, IRONPCTSAT    RADIOGRAPHIC STUDIES: I  have personally reviewed the radiological images as listed and agreed with the findings in the report. Dg Chest Port 1 View  Result Date: 12/31/2018 CLINICAL DATA:  Upper abdominal pain. EXAM: PORTABLE CHEST 1 VIEW COMPARISON:  Radiographs of August 05, 2018. FINDINGS: Stable cardiomediastinal silhouette. No pneumothorax or pleural effusion is noted. Both lungs are clear. The visualized skeletal structures are unremarkable. IMPRESSION: No active disease. Electronically Signed   By: Marijo Conception M.D.   On: 12/31/2018 14:26   Ct Bone Marrow Biopsy & Aspiration  Result Date: 01/15/2019 INDICATION: 63 year old male with thrombocytopenia EXAM: CT BONE MARROW BIOPSY AND ASPIRATION MEDICATIONS: None. ANESTHESIA/SEDATION: Moderate (conscious) sedation was employed during this procedure. A total of Versed 1.0 mg and Fentanyl 75 mcg was administered intravenously. Moderate Sedation Time: 11 minutes. The patient's level of consciousness and vital signs were monitored continuously by radiology nursing throughout the procedure under my direct supervision. FLUOROSCOPY TIME:  CT COMPLICATIONS: None PROCEDURE: The procedure risks, benefits, and alternatives were explained to the patient. Questions regarding the procedure were encouraged and answered. The patient understands and consents to the procedure. Scout CT of the pelvis was performed for surgical planning purposes. The right posterior pelvis was prepped with Chlorhexidine in a sterile fashion, and a sterile drape was applied covering the operative field. A sterile gown and sterile gloves were used for the procedure. Local anesthesia was provided with 1% Lidocaine. Posterior right iliac bone was targeted for biopsy. The skin and subcutaneous tissues were infiltrated with 1% lidocaine without epinephrine. A small stab incision was made with an 11 blade scalpel, and an 11 gauge Murphy needle was advanced with CT guidance to the posterior cortex. Manual forced was used  to advance the needle through the posterior cortex and the stylet was removed. A bone marrow aspirate was retrieved and passed to a cytotechnologist in the room. The Murphy needle was then advanced without the stylet for a core biopsy. The core biopsy was retrieved and also passed to a cytotechnologist. Manual pressure was used for hemostasis and a sterile dressing was placed. No complications were encountered no significant blood loss was encountered. Patient tolerated the procedure well. Normal hemodynamics, with 1 episode of hypoxia registering 80-85% which resolved immediately with stimulation from the procedure. IMPRESSION: Status post CT-guided bone marrow biopsy,  with tissue specimen sent to pathology for complete histopathologic analysis Signed, Dulcy Fanny. Earleen Newport, DO [facility] Electronically Signed   By: Corrie Mckusick D.O.   On: 01/15/2019 09:56     ASSESSMENT & PLAN:  1. 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 Thrombocytopenia ([place])   3. Neutropenia, unspecified type ([place])   4. Macrocytic anemia    #Labs were reviewed and discussed with patient. 01/15/2019 bone marrow biopsy results from with patient. Patient has hypercellular marrow with multilineage dysplasia and an increased blast 20%, consistent with acute myeloid leukemia.  Peripheral blood showed pancytopenia with circulating blasts 7%. I discussed with pathologist Dr. Melina Copa.  [employer] AML FISH were sent on 9/14 2020.  Anticipates results  3 to 5 days.  Dr.Butler will also add on TP53 mutation status.  I discussed with patient that his constitutional symptoms fatigue/decreased appetite/weight loss are most likely secondary to acute leukemia.  I recommend patient to establish care with tertiary center [facility] malignant hematology.  Patient agrees.  Patient is difficult to reach by his phone.  I called him multiple times this morning and not able to reach  him to discuss results.  Patient said that he will pay more attention to his phone and he anticipate a call from [facility].  Patient tells me that he does not have money for enough gas to go to [facility].  He is going to ask his sisters to help him. I was able to reach one of his sisters Blanch Media and updated her with the recommendation.  Not able to reach his other sister Marlowe Kays.  Advised patient to stay off aspirin, NSAIDs. Reports chronic upper abdominal pain recommend patient to continue PPI, sucralfate.  I discussed patient's case with Dr.Zeidner and patient has an appointment with him 01/23/2019 2:30 PM.  Advised patient that if he spikes fever, bleeding events, etc. he needs to go to [facility] ER for immediate evaluation.  He voices understanding.   All questions were answered. The patient knows to call the clinic with any problems questions or concerns.  cc McLean-Scocuzza, Olivia Mackie *  Dr.Anna Kiran  We spent sufficient time to discuss many aspect of care, questions were answered to patient's satisfaction. Total face to face encounter time for this patient visit was 40 min. >50% of the time was  spent in counseling and coordination of care.    Earlie Server, MD, PhD Hematology Oncology [facility] at [facility] Pager- [phone] 01/21/2019

## 2019-01-20 NOTE — Telephone Encounter (Signed)
I will not be Rx narcotics for bone marrow biopsy done 01/15/19 Will CC Darrall Dears DO to see if they are agreeable    [place]

## 2019-01-21 ENCOUNTER — Telehealth: Payer: Self-pay

## 2019-01-21 ENCOUNTER — Encounter: Payer: Self-pay | Admitting: Internal Medicine

## 2019-01-21 DIAGNOSIS — C9200 Acute myeloblastic leukemia, not having achieved remission: Secondary | ICD-10-CM | POA: Insufficient documentation

## 2019-01-21 DIAGNOSIS — C9590 Leukemia, unspecified not having achieved remission: Secondary | ICD-10-CM | POA: Insufficient documentation

## 2019-01-21 DIAGNOSIS — D539 Nutritional anemia, unspecified: Secondary | ICD-10-CM | POA: Insufficient documentation

## 2019-01-21 DIAGNOSIS — D709 Neutropenia, unspecified: Secondary | ICD-10-CM | POA: Insufficient documentation

## 2019-01-21 NOTE — Telephone Encounter (Signed)
LMTCB

## 2019-01-21 NOTE — Telephone Encounter (Signed)
Noted diagnosis acute leukemia and directed to go to the emergency room.  We will follow-up with the patient in a few weeks hopefully after this acute emergency has been dealt with at [facility].

## 2019-01-21 NOTE — Telephone Encounter (Signed)
I am sorry about the leukemia diagnosis   Dr. Tasia Catchings stated  bone marrow biopsy showed acute leukemia she recommends him to go to [facility] emergency room immediately especially if he is not feeling well but he also has appt with [facility] cancer center Thursday so not sure what he watns to do   Stomach pain will let Dr. Jonathon Bellows know

## 2019-01-21 NOTE — Telephone Encounter (Signed)
Noted thank you

## 2019-01-21 NOTE — Telephone Encounter (Signed)
I talked to [facility] malignant hematology Dr.Zeidner and helped him to get appt with [facility] on 01/23/2019, 2:30Pm.Marland Kitchen He reported feeling tired and fatigued, no appetite, when I saw him yesterday, otherwise clinically stable. Pancytopenia, I advised him that if he develops bleeding events, fever, etc he should go to [facility] ER.

## 2019-01-21 NOTE — Telephone Encounter (Signed)
I spoke with pt already this morning. Did not call pt back.

## 2019-01-21 NOTE — Telephone Encounter (Signed)
Spoke with pt and he stated that he was diagnosed with leukemia yesterday. He stated that he is very weak, has not felt like eating in 3 or 4 days, and he has been having stomach pain. The pt stated that he was referred to [facility] cancer center and he has an appt with them on Thursday.

## 2019-01-21 NOTE — Telephone Encounter (Signed)
Copied from [place] [id number]. Topic: General - Other >> Jan 21, 2019  8:46 AM Leward Quan A wrote: Reason for CRM: Patient called to say that he had a missed call from this number was a little upset because there was nothing noted in his chart. Patient can be reached at Ph# [phone]

## 2019-01-21 NOTE — Telephone Encounter (Signed)
Pt called back and returned your call.

## 2019-01-22 ENCOUNTER — Encounter (HOSPITAL_COMMUNITY): Payer: Self-pay | Admitting: Oncology

## 2019-01-22 DIAGNOSIS — C9200 Acute myeloblastic leukemia, not having achieved remission: Secondary | ICD-10-CM

## 2019-01-22 DIAGNOSIS — D696 Thrombocytopenia, unspecified: Secondary | ICD-10-CM

## 2019-01-23 ENCOUNTER — Ambulatory Visit: Payer: Medicare HMO | Admitting: Oncology

## 2019-01-23 ENCOUNTER — Ambulatory Visit: Admit: 2019-01-23 | Discharge: 2019-01-24 | Payer: MEDICARE

## 2019-01-23 ENCOUNTER — Ambulatory Visit
Admit: 2019-01-23 | Discharge: 2019-01-24 | Payer: MEDICARE | Attending: Hematology & Oncology | Primary: Hematology & Oncology

## 2019-01-23 DIAGNOSIS — Z7951 Long term (current) use of inhaled steroids: Secondary | ICD-10-CM

## 2019-01-23 DIAGNOSIS — F79 Unspecified intellectual disabilities: Secondary | ICD-10-CM

## 2019-01-23 DIAGNOSIS — J449 Chronic obstructive pulmonary disease, unspecified: Secondary | ICD-10-CM

## 2019-01-23 DIAGNOSIS — I10 Essential (primary) hypertension: Secondary | ICD-10-CM

## 2019-01-23 DIAGNOSIS — Z87891 Personal history of nicotine dependence: Secondary | ICD-10-CM

## 2019-01-23 DIAGNOSIS — D61818 Other pancytopenia: Secondary | ICD-10-CM

## 2019-01-23 DIAGNOSIS — G3184 Mild cognitive impairment, so stated: Secondary | ICD-10-CM

## 2019-01-23 DIAGNOSIS — Z808 Family history of malignant neoplasm of other organs or systems: Secondary | ICD-10-CM

## 2019-01-23 DIAGNOSIS — Z801 Family history of malignant neoplasm of trachea, bronchus and lung: Secondary | ICD-10-CM

## 2019-01-23 DIAGNOSIS — F419 Anxiety disorder, unspecified: Secondary | ICD-10-CM

## 2019-01-23 DIAGNOSIS — M170 Bilateral primary osteoarthritis of knee: Secondary | ICD-10-CM

## 2019-01-23 DIAGNOSIS — E119 Type 2 diabetes mellitus without complications: Secondary | ICD-10-CM

## 2019-01-23 DIAGNOSIS — Z955 Presence of coronary angioplasty implant and graft: Secondary | ICD-10-CM

## 2019-01-23 DIAGNOSIS — Z9049 Acquired absence of other specified parts of digestive tract: Secondary | ICD-10-CM

## 2019-01-23 DIAGNOSIS — Z7902 Long term (current) use of antithrombotics/antiplatelets: Secondary | ICD-10-CM

## 2019-01-23 DIAGNOSIS — I2510 Atherosclerotic heart disease of native coronary artery without angina pectoris: Secondary | ICD-10-CM

## 2019-01-23 DIAGNOSIS — I252 Old myocardial infarction: Secondary | ICD-10-CM

## 2019-01-23 DIAGNOSIS — H9190 Unspecified hearing loss, unspecified ear: Secondary | ICD-10-CM

## 2019-01-23 DIAGNOSIS — Z7982 Long term (current) use of aspirin: Secondary | ICD-10-CM

## 2019-01-23 DIAGNOSIS — Z7984 Long term (current) use of oral hypoglycemic drugs: Secondary | ICD-10-CM

## 2019-01-23 DIAGNOSIS — F329 Major depressive disorder, single episode, unspecified: Secondary | ICD-10-CM

## 2019-01-23 DIAGNOSIS — Q998 Other specified chromosome abnormalities: Secondary | ICD-10-CM

## 2019-01-23 DIAGNOSIS — E785 Hyperlipidemia, unspecified: Secondary | ICD-10-CM

## 2019-01-23 DIAGNOSIS — C9200 Acute myeloblastic leukemia, not having achieved remission: Secondary | ICD-10-CM

## 2019-01-23 DIAGNOSIS — D696 Thrombocytopenia, unspecified: Secondary | ICD-10-CM

## 2019-01-23 MED ORDER — VALACYCLOVIR 500 MG TABLET
ORAL_TABLET | Freq: Every day | ORAL | 0 refills | 30 days | Status: SS
Start: 2019-01-23 — End: 2019-02-17

## 2019-01-23 MED ORDER — ALLOPURINOL 300 MG TABLET
ORAL_TABLET | Freq: Every day | ORAL | 0 refills | 30 days | Status: CP
Start: 2019-01-23 — End: 2019-02-17

## 2019-01-24 ENCOUNTER — Encounter: Admission: RE | Admit: 2019-01-24 | Payer: Medicare HMO | Source: Ambulatory Visit

## 2019-01-24 ENCOUNTER — Other Ambulatory Visit: Payer: Self-pay

## 2019-01-24 DIAGNOSIS — C9200 Acute myeloblastic leukemia, not having achieved remission: Secondary | ICD-10-CM

## 2019-01-27 ENCOUNTER — Inpatient Hospital Stay: Payer: Medicare HMO

## 2019-01-27 ENCOUNTER — Other Ambulatory Visit: Payer: Self-pay | Admitting: Oncology

## 2019-01-27 ENCOUNTER — Ambulatory Visit
Admission: RE | Admit: 2019-01-27 | Discharge: 2019-01-27 | Disposition: A | Discharge status: Home / Self Care | Payer: Medicare HMO | Source: Ambulatory Visit | Attending: Oncology | Admitting: Oncology

## 2019-01-27 ENCOUNTER — Other Ambulatory Visit: Payer: Self-pay

## 2019-01-27 DIAGNOSIS — M25561 Pain in right knee: Secondary | ICD-10-CM

## 2019-01-27 DIAGNOSIS — R1010 Upper abdominal pain, unspecified: Secondary | ICD-10-CM | POA: Diagnosis not present

## 2019-01-27 DIAGNOSIS — R5382 Chronic fatigue, unspecified: Secondary | ICD-10-CM | POA: Diagnosis not present

## 2019-01-27 DIAGNOSIS — C9200 Acute myeloblastic leukemia, not having achieved remission: Secondary | ICD-10-CM

## 2019-01-27 DIAGNOSIS — D61818 Other pancytopenia: Secondary | ICD-10-CM | POA: Diagnosis not present

## 2019-01-27 DIAGNOSIS — R0602 Shortness of breath: Secondary | ICD-10-CM | POA: Diagnosis not present

## 2019-01-27 DIAGNOSIS — M1711 Unilateral primary osteoarthritis, right knee: Secondary | ICD-10-CM | POA: Diagnosis not present

## 2019-01-27 DIAGNOSIS — D696 Thrombocytopenia, unspecified: Secondary | ICD-10-CM

## 2019-01-27 DIAGNOSIS — R0902 Hypoxemia: Secondary | ICD-10-CM | POA: Diagnosis not present

## 2019-01-27 DIAGNOSIS — D649 Anemia, unspecified: Secondary | ICD-10-CM

## 2019-01-27 DIAGNOSIS — D539 Nutritional anemia, unspecified: Secondary | ICD-10-CM | POA: Diagnosis not present

## 2019-01-27 DIAGNOSIS — R634 Abnormal weight loss: Secondary | ICD-10-CM | POA: Diagnosis not present

## 2019-01-27 LAB — CBC WITH DIFFERENTIAL/PLATELET
Abs Immature Granulocytes: 0.4 10*3/uL — ABNORMAL HIGH (ref 0.00–0.07)
Band Neutrophils: 7 %
Basophils Absolute: 0.1 10*3/uL (ref 0.0–0.1)
Basophils Relative: 2 %
Blasts: 14 %
Eosinophils Absolute: 0.1 10*3/uL (ref 0.0–0.5)
Eosinophils Relative: 2 %
HCT: 18.7 % — ABNORMAL LOW (ref 39.0–52.0)
Hemoglobin: 6.3 g/dL — ABNORMAL LOW (ref 13.0–17.0)
Lymphocytes Relative: 45 %
Lymphs Abs: 1.6 10*3/uL (ref 0.7–4.0)
MCH: 34.1 pg — ABNORMAL HIGH (ref 26.0–34.0)
MCHC: 33.7 g/dL (ref 30.0–36.0)
MCV: 101.1 fL — ABNORMAL HIGH (ref 80.0–100.0)
Metamyelocytes Relative: 3 %
Monocytes Absolute: 0.3 10*3/uL (ref 0.1–1.0)
Monocytes Relative: 7 %
Myelocytes: 7 %
Neutro Abs: 0.7 10*3/uL — ABNORMAL LOW (ref 1.7–7.7)
Neutrophils Relative %: 12 %
Platelets: 15 10*3/uL — CL (ref 150–400)
Promyelocytes Relative: 1 %
RBC: 1.85 MIL/uL — ABNORMAL LOW (ref 4.22–5.81)
RDW: 15.4 % (ref 11.5–15.5)
Smear Review: DECREASED
WBC Morphology: ABNORMAL
WBC: 3.6 10*3/uL — ABNORMAL LOW (ref 4.0–10.5)
nRBC: 9.1 % — ABNORMAL HIGH (ref 0.0–0.2)

## 2019-01-27 LAB — PREPARE RBC (CROSSMATCH)

## 2019-01-27 LAB — PATHOLOGIST SMEAR REVIEW

## 2019-01-27 MED ORDER — MORPHINE SULFATE (PF) 2 MG/ML IV SOLN
1.0000 mg | Freq: Once | INTRAVENOUS | Status: AC
  Administered 2019-01-27: 12:00:00 1 mg via INTRAVENOUS
  Filled 2019-01-27: qty 1

## 2019-01-27 MED ORDER — DIPHENHYDRAMINE HCL 25 MG PO CAPS
25.0000 mg | ORAL_CAPSULE | Freq: Once | ORAL | Status: AC
  Administered 2019-01-27: 25 mg via ORAL
  Filled 2019-01-27: qty 1

## 2019-01-27 MED ORDER — MORPHINE SULFATE 2 MG/ML IJ SOLN: 1.0000 mg | Freq: Once | INTRAMUSCULAR | Status: DC

## 2019-01-27 MED ORDER — ACETAMINOPHEN 325 MG PO TABS
650.0000 mg | ORAL_TABLET | Freq: Once | ORAL | Status: AC
  Administered 2019-01-27: 650 mg via ORAL
  Filled 2019-01-27: qty 2

## 2019-01-27 MED ORDER — SODIUM CHLORIDE 0.9% IV SOLUTION
250.0000 mL | Freq: Once | INTRAVENOUS | Status: AC
  Administered 2019-01-27: 250 mL via INTRAVENOUS
  Filled 2019-01-27: qty 250

## 2019-01-28 ENCOUNTER — Telehealth: Payer: Self-pay | Admitting: Gastroenterology

## 2019-01-28 LAB — PREPARE PLATELET PHERESIS: Unit division: 0

## 2019-01-28 LAB — BPAM PLATELET PHERESIS
Blood Product Expiration Date: 202009232359
ISSUE DATE / TIME: 202009211235
Unit Type and Rh: 6200

## 2019-01-28 LAB — TYPE AND SCREEN
ABO/RH(D): A POS
Antibody Screen: NEGATIVE
Unit division: 0

## 2019-01-28 LAB — BPAM RBC
Blood Product Expiration Date: 202009222359
ISSUE DATE / TIME: 202009211038
Unit Type and Rh: 9500

## 2019-01-28 NOTE — Telephone Encounter (Signed)
Trish with Endo called & stated patient did not have Covid test.

## 2019-01-29 ENCOUNTER — Encounter: Admission: RE | Payer: Self-pay | Source: Ambulatory Visit

## 2019-01-29 ENCOUNTER — Ambulatory Visit: Admission: RE | Admit: 2019-01-29 | Payer: Medicare HMO | Source: Ambulatory Visit | Admitting: Gastroenterology

## 2019-01-29 SURGERY — COLONOSCOPY WITH PROPOFOL
Anesthesia: General

## 2019-01-30 ENCOUNTER — Ambulatory Visit: Payer: Medicare HMO | Admitting: Gastroenterology

## 2019-01-30 ENCOUNTER — Other Ambulatory Visit: Payer: Self-pay | Admitting: *Deleted

## 2019-01-30 ENCOUNTER — Ambulatory Visit
Admit: 2019-01-30 | Discharge: 2019-01-31 | Payer: MEDICARE | Attending: Hematology & Oncology | Primary: Hematology & Oncology

## 2019-01-30 ENCOUNTER — Other Ambulatory Visit: Admit: 2019-01-30 | Discharge: 2019-01-31 | Payer: MEDICARE

## 2019-01-30 ENCOUNTER — Ambulatory Visit: Admit: 2019-01-30 | Discharge: 2019-01-31 | Payer: MEDICARE

## 2019-01-30 DIAGNOSIS — H9190 Unspecified hearing loss, unspecified ear: Secondary | ICD-10-CM

## 2019-01-30 DIAGNOSIS — Z7982 Long term (current) use of aspirin: Secondary | ICD-10-CM

## 2019-01-30 DIAGNOSIS — F329 Major depressive disorder, single episode, unspecified: Secondary | ICD-10-CM

## 2019-01-30 DIAGNOSIS — Z87891 Personal history of nicotine dependence: Secondary | ICD-10-CM

## 2019-01-30 DIAGNOSIS — D649 Anemia, unspecified: Secondary | ICD-10-CM

## 2019-01-30 DIAGNOSIS — Z7902 Long term (current) use of antithrombotics/antiplatelets: Secondary | ICD-10-CM

## 2019-01-30 DIAGNOSIS — E785 Hyperlipidemia, unspecified: Secondary | ICD-10-CM

## 2019-01-30 DIAGNOSIS — I252 Old myocardial infarction: Secondary | ICD-10-CM

## 2019-01-30 DIAGNOSIS — D61818 Other pancytopenia: Secondary | ICD-10-CM

## 2019-01-30 DIAGNOSIS — C9200 Acute myeloblastic leukemia, not having achieved remission: Secondary | ICD-10-CM

## 2019-01-30 DIAGNOSIS — F79 Unspecified intellectual disabilities: Secondary | ICD-10-CM

## 2019-01-30 DIAGNOSIS — Z7984 Long term (current) use of oral hypoglycemic drugs: Secondary | ICD-10-CM

## 2019-01-30 DIAGNOSIS — Z808 Family history of malignant neoplasm of other organs or systems: Secondary | ICD-10-CM

## 2019-01-30 DIAGNOSIS — F419 Anxiety disorder, unspecified: Secondary | ICD-10-CM

## 2019-01-30 DIAGNOSIS — Z9049 Acquired absence of other specified parts of digestive tract: Secondary | ICD-10-CM

## 2019-01-30 DIAGNOSIS — I2510 Atherosclerotic heart disease of native coronary artery without angina pectoris: Secondary | ICD-10-CM

## 2019-01-30 DIAGNOSIS — I10 Essential (primary) hypertension: Secondary | ICD-10-CM

## 2019-01-30 DIAGNOSIS — D696 Thrombocytopenia, unspecified: Secondary | ICD-10-CM

## 2019-01-30 DIAGNOSIS — Z955 Presence of coronary angioplasty implant and graft: Secondary | ICD-10-CM

## 2019-01-30 DIAGNOSIS — Z801 Family history of malignant neoplasm of trachea, bronchus and lung: Secondary | ICD-10-CM

## 2019-01-30 DIAGNOSIS — Q998 Other specified chromosome abnormalities: Secondary | ICD-10-CM

## 2019-01-30 DIAGNOSIS — M25561 Pain in right knee: Secondary | ICD-10-CM

## 2019-01-30 DIAGNOSIS — Z9221 Personal history of antineoplastic chemotherapy: Secondary | ICD-10-CM

## 2019-01-30 DIAGNOSIS — M170 Bilateral primary osteoarthritis of knee: Secondary | ICD-10-CM

## 2019-01-30 DIAGNOSIS — E119 Type 2 diabetes mellitus without complications: Secondary | ICD-10-CM

## 2019-01-30 MED ORDER — LEVOFLOXACIN 500 MG TABLET
ORAL_TABLET | Freq: Every day | ORAL | 6 refills | 30.00000 days | Status: SS
Start: 2019-01-30 — End: 2019-02-17

## 2019-01-30 MED ORDER — VENETOCLAX 100 MG TABLET
ORAL_TABLET | Freq: Every day | ORAL | 2 refills | 30 days | Status: SS
Start: 2019-01-30 — End: 2019-02-17

## 2019-01-30 MED ORDER — POSACONAZOLE 100 MG TABLET,DELAYED RELEASE
ORAL_TABLET | Freq: Every day | ORAL | 5 refills | 30 days | Status: CP
Start: 2019-01-30 — End: ?
  Filled 2019-02-13: qty 90, 30d supply, fill #0

## 2019-01-30 MED ORDER — TRAMADOL 50 MG TABLET
ORAL_TABLET | Freq: Three times a day (TID) | ORAL | 0 refills | 30 days | Status: CP | PRN
Start: 2019-01-30 — End: ?

## 2019-01-30 NOTE — Patient Outreach (Signed)
[facility] [facility]) Care Management  01/30/2019  Nicholas Reyes [birth year] PM:[id number]   [place]  Referral Date:02/25/2018 Referral Source:[facility] ED Census Reason for Referral:6 or more ED visits in the past 6 months Insurance:[employer] Medicare & Medicaid   Outreach Attempt:  Outreach attempt #7 to patient for follow up. No answer. RN Health Coach left HIPAA compliant voicemail message along with contact information.  Plan:  RN Health Coach will make another outreach attempt within the month of October.  RN Health Coach will send Unsuccessful Letter to patient.  [place] Coach [phone] [email or web address]

## 2019-01-30 NOTE — Unmapped (Signed)
Labs collected via #23 butterfly.  To next appt.

## 2019-01-30 NOTE — Unmapped (Signed)
Follow-up Visit Note    Patient Name: Raymond [name]  Patient Age: 63 y.o.  Encounter Date: 01/30/2019    Referring Physician: Dr. Rickard Patience    Primary Care Provider:  Donnald Garre, MD    Consulting Physician: N/A    Reason for visit: Newly diagnosis AML    Assessment:    AML with myelodysplastic changes  Pt initially presented with pancytopenia on routine pre-op lab testing.  Subsequent BMBx was consistent with AML with multiple poor risk features including an abnormal karyotype, Del(5q), -17.  Of note, TP53 mutation status is still pending.  The diagnosis of AML was reviewed last week in detail with the pt and his sister.  Treatment options, along with the risks and benefits of each option were also discussed in detail and included discussion of induction chemotherapy, azacitidine/venetoclax, and our clinical trial with azacitidine/venetoclax.  They return today to further discuss treatment options.  Unfortunately the pt will not be able to move in with his sister who lives in [place] and is now planning to move into a trailer in the backyard of his other sister's house [name]) who lives close to [place].  The patient and his sister [name], who has been present for both visits, reported that the frequency of visits needed for the clinical trial would be too demanding, as they live over 2 hours away and [name] is also helping to support her husband who is undergoing chemotherapy, therefore they declined enrollment.  Based on Mr. Evetts current performance status and comorbidities he is not a good candidate for high dose induction chemotherapy.  Therefore, our recommendation is to start treatment with azacitidine/venetoclax.  We reviewed the risks in detail and specifically stressed the risk of leukopenia/neutropenia, infection, and TLS.  We also discussed that for the first week of therapy the pt will need to be seen daily here at [facility] for close monitoring of toxicities during the ramp up period for venetoclax.  Then after that future visits can likely be done locally.  We discussed staying at [facility] for that week as a potential option.  The pt and his sister were amendable to this plan.    Plans/Recommendations:  - Will follow-up on TP53 mutation results, although will not change treatment recommendations at this point.  - RTC on 10/5 for visit with Arna Medici NP and our pharmacist and for C1D1 of Azacitadine/Venetoclax.  - Recommended twice weekly labs locally in the interim as detailed below  - Continue daily acyclovir and allopurinol.  - Start PPx levaquin 500 mg daily (ANC is 1.0 and will continued to downtrend).  Recommended discontinuing hydroxyzine (to avoid additional QTc prolonging medications).   - Will look into establishing care with a local Oncologist in [place] as this is close to his sister's (Connie's) house.    Anemia/Thrombocytopenia  Pt has developed worsening anemia and thrombocytopenia.  Was seen by his local Oncologist on 9/21, Hgb was 6.3 and Plts were 15, received 1U of PRBCs and Plts.  Today his Hgb is 6.2 and Plts are 23.  Plan was to transfuse 2U of PRBCs this afternoon, but unfortunately could only transfuse 1U PRBCs prior to infusion center closing.  Therefore plan is for Pt to follow-up with his local Oncologist tomorrow for labs and a second unit of PRBCs.  This was discussed with the pt's sister.  - Will obtain twice weekly labs (on Mondays and Thursdays) locally with transfusions as needed.  - Reiterated the importance of avoiding all NSAIDs (  including BC powder).    Knee Pain  - Will start tramadol 50 mg Q8H prn and recommended alternating with tylenol.  - Recommended discontinuing flexeril, given possible interaction with tramadol.    Hx of STEMI/CAD  STEMI in 2010 and CAD with multiple stents (2010 & 2017).  Most recent catheterization in 2019 showed normal LV function and patent stents.  Follows with [facility].  Was on DAPT with ASA 81 and Plavix, which we have discontinued given degree of thrombocytopenia.  - Possible episode of angina a few days ago, suspect secondary to anemia in setting of known hx of angina/CAD.    Abdominal Pain  Follows with GI locally.  Etiology of pain is unclear.  CT of the A/P in June was unrevealing; notably spleen was normal in size.  Plan was to undergo EGD/colonoscopy, but this will likely have to be delayed given new diagnosis of AML and degree of thrombocytopenia.    OSA, Asthma, and Possible COPD  Could not find record of prior PFTs, although pt carries a diagnosis of COPD in his medical record and was a long term smoker.  Endorses SOB/DOE and has poor air movement on exam, although is not hypoxic on RA.    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  Pt remains engaged in conversation and able to describe the symptoms he is currently experiencing.  Also conveys an understanding of his current diagnosis and need for treatment, but was unable to describe many of the details of the treatment options we discussed during last week's visit.    Pt was seen and discussed with Dr. Vertell Limber.    Doree Barthel, MD  Hematology/Oncology Fellow, PGY4    History of Present Illness:     Breyon [name] is a 63 y.o. male who is seen in consultation at the request of Self, Referred for an evaluation of newly diagnosed AML.  Other notable PMH includes STEMI (12/2008, s/p overlapping BMS)/CAD (w/ repeat PCI in 02/2016), angina, HTN/HLD, T2DM, intellectual disability (unclear type), possible COPD (noted in PCP's notes and long term smoker), asthma, OSA and psoriasis (on secukinumab monthly injections).    Pt was scheduled for knee replacement last month and was found to be pancytopenic on pre-op labs (on 8/6) with WBC 2.9, Hgb 13.3, and Plts of 64; prior CBC on 10/10/18 was only notable for mild thrombocytopenia (which had been present since 2019).  Other notable labs (on 8/11) include flow cytometry showing 8% myeloblasts, LDH 215, HIV negative.  He was referred to Oncology for further work-up and had a BMBx on 01/15/19 was notable for hypercellular marrow with 30% blasts consistent with AML.  Cytogenetics from OSH report notable for 5q deletion and abnormal karyotype.  Of not, TP53 mutation status is pending at this time.  Repeat CBC on 01/15/19 with WBC 2.6, Hgb 9.8, and Plts 26.    Interval History:  Was seen by his local Oncologist (Dr. Cathie Hoops) on 9/21.  Was found to have a Hgb of 6.3 and Plts were 15, received 1U of PRBCs and Plts.  Also reported receiving an IV pain medication for severe right knee pain.    Today pt is endorsing persistent severe right knee pain.  Also endorsing continued intermittent abdominal pain.  Felt lightheaded earlier this afternoon and required a wheelchair.  Denied lightheadedness while sitting.  Had some chest pain and SOB a few days  ago, which resolved with NG (has a hx of angina/CAD).  Denied subsequent/current CP and/or SOB.  Also endorsed a dark/blackish colored BM a few days ago, but has not had a BM since that time.  Denied epistaxis, oral mucosal bleeding, hemoptysis, bruising, and hematuria.  Denied fevers/chills, severe HAs, changes in vision, sore throat, coughing, nausea/vomitng, diarrhea, and dysuria.    Of note, pt reported taking BC powder a couple days ago for his knee pain.  We discussed that these powders have contain high dose aspirin and he should no longer take these supplements or other NSAIDs.    Oncology History:    Oncology History Overview Note   Diagnosis: AML with MDS-Related Changes    Presentation: Pancytopenia    Final Diagnosis   Date Value Ref Range Status   01/15/2019   Final    (Outside Case #:  [id number], dated 01/15/2019)  Bone marrow,  aspiration and biopsy  -  Hypercellular bone marrow (70%) involved by acute myeloid leukemia (30% blasts by manual aspirate differential) (See Comment)        This electronic signature is attestation that the pathologist personally reviewed the submitted material(s) and the final diagnosis reflects that evaluation.          Cytogenetics: Complex/Monosomal Karyotype with Del(5q), -17    Flow cytometry: CD7+, CD33+, CD34+, CD38+, CD117+, CD123+, HLA-DR+     Acute myeloid leukemia not having achieved remission (CMS-HCC)   01/15/2019 Biopsy    Final Diagnosis   Date Value Ref Range Status   01/15/2019   Final    (Outside Case #:  [id number], dated 01/15/2019)  Bone marrow,  aspiration and biopsy  -  Hypercellular bone marrow (70%) involved by acute myeloid leukemia (30% blasts by manual aspirate differential) (See Comment)        This electronic signature is attestation that the pathologist personally reviewed the submitted material(s) and the final diagnosis reflects that evaluation.          Cytogenetics: Complex Karyotype with Del(5q), -17     01/23/2019 Initial Diagnosis    Acute myeloid leukemia not having achieved remission (CMS-HCC)     02/10/2019 -  Chemotherapy    OP AML AZACITIDINE + VENETOCLAX  azacitidine 75 mg/m2 SQ on days 1-7, venetoclax ramp up week 1; dose dependent, then azacitidine 75 mg/m2 SQ on days 1-7 every 28 days.       Past Medical History:   Diagnosis Date   ??? Angina pectoris (CMS-HCC)    ??? Anxiety    ??? Arthritis     bilateral knees   ??? CAD (coronary artery disease)    ??? Depression    ??? Dyslipidemia    ??? HL (hearing loss)    ??? Hyperlipidemia    ??? Hypertension    ??? Medically noncompliant    ??? Mental retardation    ??? MI (myocardial infarction) (CMS-HCC)    ??? Migraines    ??? Sleep apnea     USES CPAP OCCASIONALLY   ??? Thrombocytopenia (CMS-HCC) 01/20/2019      Past Surgical History:   Procedure Laterality Date   ??? APPENDECTOMY     ??? CARDIAC CATHETERIZATION     ??? CHOLECYSTECTOMY     ??? CORONARY STENT PLACEMENT     ??? HEMORRHOID SURGERY     ??? PR CATH PLACE/CORON ANGIO, IMG SUPER/INTERP,W LEFT HEART VENTRICULOGRAPHY N/A 02/25/2016    Procedure: Left Heart Catheterization W Intervention;  Surgeon: Job Founds, MD;  Location: [facility] CATH;  Service: Cardiology   ??? PR KNEE SCOPE,PART SYNOVECT Right 04/21/2015    Procedure: ARTHROSCOPY, KNEE, SURGICAL; SYNOVECTOMY LIMITED (EG, PLICA OR SHELF RESECTION) (SEPARATE PROCEDURE);  Surgeon: Annice Needy, MD;  [facility]: [facility];  Service: Orthopedics   ??? PR KNEE SCOPE,SINGLE MENISECTOMY Right 04/21/2015    Procedure: ARTHROSCOPY, KNEE; W/MENISECTMY/CHONDROPLASTY/SYNOVECTOMY;  Surgeon: Annice Needy, MD;  Location: [name];  Service: Orthopedics   ??? PR XCAPSL CTRC RMVL INSJ IO LENS PROSTH W/O ECP Right 03/12/2017    Procedure: CATARACT RIGHT EYE;  Surgeon: Gwynneth Aliment, MD;  [name];  Service: Ophthalmology   ??? PR XCAPSL CTRC RMVL INSJ IO LENS PROSTH W/O ECP Left 05/28/2017    Procedure: CATARACT LEFT EYE;  Surgeon: Gwynneth Aliment, MD;  Location: [name];  Service: Ophthalmology   ??? SKIN BIOPSY     ??? TONSILLECTOMY        Family History   Problem Relation Age of Onset   ??? Heart attack Father    ??? Melanoma Neg Hx    ??? Basal cell carcinoma Neg Hx    ??? Squamous cell carcinoma Neg Hx       Family Status   Relation Name Status   ??? Mother  Deceased   ??? Father  Deceased   ??? Neg Hx  (Not Specified)     Other notable Family History:  Brother died from MI.  Mother and Father both had Lung Cancer  Sister and Father: Squamous cell skin cancer  Sister: vWD  No known family history of leukemia.    Social History     Occupational History   ??? Not on file   Tobacco Use   ??? Smoking status: Former Smoker     Packs/day: 1.00     Years: 5.00     Pack years: 5.00     Types: Cigarettes     Quit date: 04/11/2014     Years since quitting: 4.8   ??? Smokeless tobacco: Never Used   Substance and Sexual Activity   ??? Alcohol use: No     Alcohol/week: 0.0 standard drinks   ??? Drug use: No   ??? Sexual activity: Not Currently     Partners: Female     No Known Allergies      Current Outpatient Medications   Medication Sig Dispense Refill   ??? acetaminophen (TYLENOL) 650 MG CR tablet Take 1 tablet (650 mg total) by mouth every eight (8) hours as needed for pain. 30 tablet 0   ??? albuterol (PROVENTIL HFA;VENTOLIN HFA) 90 mcg/actuation inhaler Inhale 2 puffs every four (4) hours as needed.     ??? allopurinoL (ZYLOPRIM) 300 MG tablet Take 1 tablet (300 mg total) by mouth daily. 30 tablet 0   ??? amLODIPine (NORVASC) 5 MG tablet TAKE ONE TABLET BY MOUTH EVERY DAY 30 tablet 2   ??? aspirin (ECOTRIN) 81 MG tablet Take 81 mg by mouth daily.     ??? atorvastatin (LIPITOR) 20 MG tablet Take 20 mg by mouth daily.     ??? azelastine (ASTELIN) 137 mcg (0.1 %) nasal spray 1 spray by Each Nare route Two (2) times a day. Use in each nostril as directed 1.2 mL 0   ??? benazepril (LOTENSIN) 20 MG tablet Take 20 mg by mouth daily.     ??? butalbital-acetaminophen-caff (FIORICET) 50-300-40 mg cap Take 1 capsule by mouth every six (6) hours as needed for headache. 20 capsule 0   ???  cetirizine (ZYRTEC) 10 MG tablet TAKE ONE Tablet BY MOUTH IN THE MORNING  2   ??? citalopram (CELEXA) 20 MG tablet Take 20 mg by mouth daily.     ??? clobetasoL (TEMOVATE) 0.05 % ointment Apply topically once daily, only to thick spots, as needed. 60 g 2   ??? clopidogrel (PLAVIX) 75 mg tablet Take 1 tablet (75 mg total) by mouth daily. 90 tablet 3   ??? diclofenac sodium (VOLTAREN) 1 % gel Apply 2 g topically.     ??? diphenhydrAMINE (BENADRYL) 25 mg capsule Take 1 capsule (25 mg total) by mouth every six (6) hours as needed for itching. 20 capsule 0   ??? DULoxetine (CYMBALTA) 60 MG capsule Take 60 mg by mouth daily.     ??? FLECTOR 1.3 % PT12 Place 1 patch on the skin daily.  0   ??? fluticasone (FLONASE) 50 mcg/actuation nasal spray USE TWO SPRAYS EACH nostril ONCE daily  6   ??? furosemide (LASIX) 40 MG tablet TAKE ONE TABLET BY MOUTH EVERY DAY 30 tablet 2   ??? gabapentin (NEURONTIN) 300 MG capsule Take 300 mg by mouth nightly.  5   ??? hydrOXYzine (ATARAX) 50 MG tablet Take 50 mg by mouth nightly as needed.  2   ??? ibuprofen (ADVIL,MOTRIN) 600 MG tablet Take 1 tablet (600 mg total) by mouth every six (6) hours as needed for pain. 30 tablet 0   ??? isosorbide dinitrate (ISORDIL) 20 MG tablet Take 20 mg by mouth Two (2) times a day.     ??? ketorolac (ACULAR) 0.5 % ophthalmic solution USE 1 DROP TO OPERATIVE EYE 4 TIMES A DAY AS DIRECTED STARTING 2 DAYS BEFORE SURGERY  2   ??? meloxicam (MOBIC) 15 MG tablet Take 15 mg by mouth every morning before breakfast.  5   ??? metFORMIN (GLUCOPHAGE) 500 MG tablet      ??? metoprolol succinate (TOPROL-XL) 25 MG 24 hr tablet Take 25 mg by mouth daily.     ??? metoprolol tartrate (LOPRESSOR) 25 MG tablet Take by mouth.     ??? montelukast (SINGULAIR) 10 mg tablet Take 10 mg by mouth nightly.  11   ??? moxifloxacin (VIGAMOX) 0.5 % ophthalmic solution INSTILL 1 DROP 4 TIMES A DAY STARTING 2 DAYS BEFORE SURGERY CONTINUE UNTIL TOLD OTHERWISE BY MD  2   ??? mupirocin (BACTROBAN) 2 % ointment Apply topically.     ??? nitroglycerin (NITROSTAT) 0.4 MG SL tablet Place 1 tablet (0.4 mg total) under the tongue every five (5) minutes as needed for chest pain. 30 tablet 0   ??? ondansetron (ZOFRAN) 4 MG tablet Take 4 mg by mouth.     ??? pantoprazole (PROTONIX) 40 MG tablet Take 1 tablet (40 mg total) by mouth daily. 90 tablet 3   ??? prednisoLONE acetate (PRED FORTE) 1 % ophthalmic suspension INSTILL 1 DROP EVERY WAKING HOUR FIRST 2 DAYS ONLY THEN 4 TIMES DAILY 3 TO 7 DAYS AFTER  0   ??? spironolactone (ALDACTONE) 25 MG tablet Take 1 tablet (25 mg total) by mouth daily. 90 tablet 3   ??? sulfamethoxazole-trimethoprim (BACTRIM DS) 800-160 mg per tablet sulfamethoxazole 800 mg-trimethoprim 160 mg tablet   TAKE 1 TABLET BY MOUTH TWICE A DAY     ??? SYMBICORT 160-4.5 mcg/actuation inhaler INHALE TWO PUFFS BY MOUTH TWICE DAILY  11   ??? triamcinolone (KENALOG) 0.1 % cream APPLY TO AFFECTED AREA(S) TWICE DAILY  11   ??? valACYclovir (VALTREX) 500 MG tablet Take 1 tablet (  500 mg total) by mouth daily. 30 tablet 0   ??? levoFLOXacin (LEVAQUIN) 500 MG tablet Take 1 tablet (500 mg total) by mouth daily. 30 tablet 6   ??? posaconazole (NOXAFIL) 100 mg TbEC delayed released tablet Take 300 mg by mouth daily. 90 tablet 5   ??? traMADoL (ULTRAM) 50 mg tablet Take 1 tablet (50 mg total) by mouth every eight (8) hours as needed for pain. 90 tablet 0   ??? venetoclax (VENCLEXTA) 100 mg tablet Take 4 tablets (400 mg total) by mouth daily. Take with a meal and water. Do not chew, crush, or break tablets. 120 tablet 2     No current facility-administered medications for this visit.      Facility-Administered Medications Ordered in Other Visits   Medication Dose Route Frequency Provider Last Rate Last Dose   ??? acetaminophen (TYLENOL) tablet 650 mg  650 mg Oral Once Doree Barthel, MD       ??? cetirizine (ZyrTEC) tablet 10 mg  10 mg Oral Once Doree Barthel, MD       ??? sodium chloride (NS) 0.9 % infusion  50 mL/hr Intravenous Continuous Doree Barthel, MD 50 mL/hr at 01/30/19 1830 50 mL/hr at 01/30/19 1830     Vital Signs for this encounter:  BSA: 2.22 meters squared  BP 143/62  - Pulse 89  - Temp 36.2 ??C (97.1 ??F) (Temporal)  - Resp 18  - Wt (!) 104.4 kg (230 lb 1.6 oz)  - SpO2 100%  - BMI 36.04 kg/m??     Physical Exam:   General- NAD, appeared stated age, obese  Skin- No rashes  HEENT- EOMI, no conjunctival injection  CVS- RRR, no M/R/G, trace LE edema  Lungs- Clear to auscultation bilaterally without rales or ronchi, although poor air movement throughout all lung fields.  Abdomen- Soft, mild epigastric/periumbilical/RUQ tenderness, but no rebound or guarding, +BS  Musculoskeletal- Good strength in UE/LE bilaterally symmetrically  Neuro- Steady, no focal deficits, has some apparent intellectual deficits, although able to have conversation and has understanding of his diagnosis.  Psychiatric- A&Ox3  (Unchanged from last week)    ECOG Performance Status: 2    Orders/Results:    Orders placed or performed in visit on 01/30/19   ??? Type and Screen   ??? Type and Screen     Recent Results (from the past 24 hour(s))   Comprehensive Metabolic Panel    Collection Time: 01/30/19  1:23 PM   Result Value Ref Range    Sodium 139 135 - 145 mmol/L    Potassium 3.6 3.5 - 5.0 mmol/L    Chloride 97 (L) 98 - 107 mmol/L    Anion Gap 10 7 - 15 mmol/L    CO2 32.0 (H) 22.0 - 30.0 mmol/L    BUN 16 7 - 21 mg/dL    Creatinine 1.61 (L) 0.70 - 1.30 mg/dL    BUN/Creatinine Ratio 24     EGFR CKD-EPI Non-African American, Male >90 >=60 mL/min/1.29m2    EGFR CKD-EPI African American, Male >90 >=60 mL/min/1.23m2    Glucose 118 70 - 179 mg/dL    Calcium 8.5 8.5 - 09.6 mg/dL    Albumin 3.5 3.5 - 5.0 g/dL    Total Protein 6.5 6.5 - 8.3 g/dL    Total Bilirubin 0.9 0.0 - 1.2 mg/dL    AST 31 19 - 55 U/L    ALT 13 <50 U/L    Alkaline Phosphatase 72 38 - 126 U/L   CBC w/ Differential  Collection Time: 01/30/19  1:23 PM   Result Value Ref Range    WBC 2.9 (L) 4.5 - 11.0 10*9/L    RBC 1.78 (L) 4.50 - 5.90 10*12/L    HGB 6.2 (L) 13.5 - 17.5 g/dL    HCT 64.4 (L) 03.4 - 53.0 %    MCV 101.8 (H) 80.0 - 100.0 fL    MCH 34.6 (H) 26.0 - 34.0 pg    MCHC 34.0 31.0 - 37.0 g/dL    RDW 74.2 (H) 59.5 - 15.0 %    MPV 11.2 (H) 7.0 - 10.0 fL    Platelet 23 (L) 150 - 440 10*9/L    nRBC 14 (H) <=4 /100 WBCs    Neutrophils % 33.2 %    Lymphocytes % 42.9 %    Monocytes % 6.6 %    Eosinophils % 1.5 %    Basophils % 2.8 %    Neutrophil Left Shift 2+ (A) Not Present    Absolute Neutrophils 1.0 (L) 2.0 - 7.5 10*9/L    Absolute Lymphocytes 1.2 (L) 1.5 - 5.0 10*9/L    Absolute Monocytes 0.2 0.2 - 0.8 10*9/L    Absolute Eosinophils 0.0 0.0 - 0.4 10*9/L    Absolute Basophils 0.1 0.0 - 0.1 10*9/L    Large Unstained Cells 13 (H) 0 - 4 %    Macrocytosis Marked (A) Not Present    Anisocytosis Slight (A) Not Present   Morphology Review    Collection Time: 01/30/19  1:23 PM   Result Value Ref Range    Smear Review Comments See Comment (A) Undefined   Type and Screen Collection Time: 01/30/19  4:38 PM   Result Value Ref Range    ABO Grouping A POS     Antibody Screen NEG    ABO/RH    Collection Time: 01/30/19  4:44 PM   Result Value Ref Range    ABO Grouping A POS    Prepare RBC    Collection Time: 01/30/19  6:34 PM   Result Value Ref Range    Crossmatch Compatible     Unit Blood Type A Pos     ISBT Number 6200     Unit # [id number]     Status Ready     Spec Expiration [id number]     Product ID Red Blood Cells     PRODUCT CODE [id number]     Crossmatch Compatible     Unit Blood Type A Pos     ISBT Number 6200     Unit # [id number]     Status Issued     Spec Expiration [id number]     Product ID Red Blood Cells     PRODUCT CODE [id number]

## 2019-01-31 ENCOUNTER — Telehealth: Payer: Self-pay

## 2019-01-31 ENCOUNTER — Other Ambulatory Visit: Payer: Self-pay | Admitting: Internal Medicine

## 2019-01-31 DIAGNOSIS — C9200 Acute myeloblastic leukemia, not having achieved remission: Secondary | ICD-10-CM

## 2019-01-31 DIAGNOSIS — J449 Chronic obstructive pulmonary disease, unspecified: Secondary | ICD-10-CM

## 2019-01-31 MED ORDER — ALBUTEROL SULFATE HFA 108 (90 BASE) MCG/ACT IN AERS: 1.0000 | INHALATION_SPRAY | Freq: Four times a day (QID) | RESPIRATORY_TRACT | 11 refills | Status: AC | PRN

## 2019-01-31 NOTE — Telephone Encounter (Signed)
Per Dr.Yu to schedule pt for lab/Blood on 9/28 and lab/MD/+/- 2 Units Blood on 02/06/19 appts were scheduled as requested. I was unable to reach pt by phone due to his vmail being full. I contacted his sister to make her aware of all scheduled appts. She stated that she will make him aware.

## 2019-01-31 NOTE — Telephone Encounter (Signed)
[facility] called and updated Dr. Tasia Catchings on patient.  Dr. Tasia Catchings wants patient to come on Monday 9/28 for labs/possible blood transfusion then on 10/1 for lab/MD/poss transfusion.  Scheduled on Monday for labs/poss 1 unit blood transfusion (1 unit due to infusion chair availability).

## 2019-01-31 NOTE — Unmapped (Signed)
Patient Raymond [name] was contacted today regarding appt scheduled for 9/29 with Raymond [name] as phone visit. Voicemail is full, unable to leave message    Samella Parr

## 2019-01-31 NOTE — Unmapped (Addendum)
[facility]SC Specialty Medication Onboarding    Specialty Medication: VENCLEXTA  Prior Authorization: Approved   Financial Assistance: No - copay  <$25  Final Copay/Day Supply: $3.90 / 30 DAYS    Insurance Restrictions: None     Notes to Pharmacist:     The triage team has completed the benefits investigation and has determined that the patient is able to fill this medication at [facility]. Please contact the patient to complete the onboarding or follow up with the prescribing physician as needed.      [facility] SSC Specialty Medication Onboarding    Specialty Medication: POSACONAZOLE   Prior Authorization: Approved   Financial Assistance: No - copay  <$25  Final Copay/Day Supply: $1.30 / 30 DAYS    Insurance Restrictions: None     Notes to Pharmacist:     The triage team has completed the benefits investigation and has determined that the patient is able to fill this medication at [facility]. Please contact the patient to complete the onboarding or follow up with the prescribing physician as needed.

## 2019-01-31 NOTE — Unmapped (Signed)
Per test claim for POSACONAZOLE AND VENCLEXTA at the [facility], patient needs Medication Assistance Program for Prior Authorization.

## 2019-01-31 NOTE — Unmapped (Signed)
AML Venetoclax Scheduling Form    Regimen:                         [x] Venetoclax/Azacitidine  [] Venetoclax/LDAC  [] Venetoclax/Decitabine    Week 1: (10/5 -10/11)  ? C1D1 CPP appointment: Katie 10/5    [x] AZACITIDINE  ? D1 - Labs, Infusion *hydration will be given over 2 hours  ? D2 - Labs, Infusion *hydration will be given over 2 hours  ? D3 - Labs, Infusion *hydration will be given over 2 hours  ? D4 - Labs, Infusion *hydration will be given over 2 hours  ? D5 - Infusion  ? D6 - Infusion  ? D7 - Labs, Infusion    Week 2: (10/12 - 10/18)  ? Day 8 CPP appointment (for access/medication compliance): Katie 10/12  ? APP or MD appointment (with labs): schedule face to face in virtual pt slot with Josh 10/15  ? Possible transfusions (Monday/Thursday or Tuesday/Friday depending on above appt): 10/12 & 10/15    Week 3: (10/19 - 10/25)  ? Day 15 - 18 - CPP appointment w/ Katie (with labs): 10/19  ? Possible transfusions (Monday/Thursday or Tuesday/Friday depending on above appt): 10/19 & 10/22    Week 4: (10/26 - 11/1)  ? Bone marrow biopsy Day 24-28 (with labs): Markus Jarvis, 10/29  ? Possible transfusions (Monday/Thursday or Tuesday/Friday depending on above appt): 10/26 & 10/29    Week 5: (11/2 - 11/8)  ? Post-marrow APP or MD appointment (with labs): Oliver Hum 11/5  ? Possible transfusions (Monday/Thursday or Tuesday/Friday depending on above appt): 11/5 & 11/8

## 2019-01-31 NOTE — Unmapped (Signed)
Contacted Raymond [name] to advise pt has transfusion appt 9/26 at 10:30, pt to arrive at 10. Inquired whether Ms. Raymond [name] is interested in local arrangements to stay for pt treatment proposed 10/5; Ms. 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 Emotional support provided. NN will loop in Social Work to assess any possible resources to assist Raymond [name] in caring for her brother. Pt verbalized understanding and expressed appreciation for the call.

## 2019-01-31 NOTE — Unmapped (Signed)
I saw and evaluated the patient, and participated in the key portions of the service.  This is a highly complex patient which requires my evaluation and assessment. I reviewed the housestaff note below and agree with the plan. Additional findings are as follows:    [name] is a 63 yo M with newly diagnosed AML with MRC, complex cytogenetics, TP53 mutation (by verbal report- full results still pending), presents to clinic for follow up. He has required 1 unit of PRBC's on Monday locally and has progressive symptoms of anemia and fatigue with hemoglobin <7 g/dL again today. He reported a dark stool 2-3 days ago but has not had any bowel movements in the past 2-3 days. He and his sister report that he does not have the resources to commit to treatment at [facility]. His sister who lives in the [place] area is not able to care for him so he is planning to stay [place]. She does not feel as if she would be able to commit to treatment at [facility] on a clinical trial. For that reason, I discussed my recommendation to initiate treatment with Azacitidine + Venetoclax.     We discussed a recently FDA-approved treatment strategy of azacitidine in combination with venetoclax for newly diagnosed AML. We discussed that prior to venetoclax FDA approval, the standard of care for elderly AML included hypomethylating agents- either azacitidine or decitabine- as single agents with response rates in the 25% range. A randomized phase 3 clinical trial revealed that Azacitidine + Venetoclax led to improved overall survival and CR rates when compared with Azacitidine + Placebo (median OS = 14.7 months vs. 9.6 months and CR/CRi rates = 66% vs. 28%- Dinardo et al. Kavin Leech 2020). Given these findings, Azacitidine + Venetoclax is an appropriate SOC for newly diagnosed elderly AML. Median duration of CR = 17.5 months. We discussed that the addition of Venetoclax to Azacitidine does pose many challenges including the need for rigorous monitoring of blood counts, dose ramp-up of Venetoclax over 3 days, daily IVF's, TLS prevention and prophylaxis, and increased risk of complications. The most frequent toxicities include but not limited to myelosuppression, infections, nausea, vomiting, diarrhea, constipation, electrolyte abnormalities, decreased appetite, and peripheral edema. We discussed the intent that this treatment would continue indefinitely until disease progression or toxicity. The overall response rate is approximately 65-70% with a median time to response 1-2 months. I recommend bone marrow biopsy after cycle 1 to assess disease response. If no response after cycle 1, I would recommend bone marrow biopsy after each cycle until response. If no response by 2-3 cycles, then it would be reasonable to consider other treatment options.      We discussed the need to receive 1st cycle of treatment at [facility] given risks of complications and toxicity without aggressive monitoring. His sister is agreeable for the patient to receive 1st cycle of treatment at [facility]. I will refer him to [facility] for further management/monitoring after cycle 1. This is closer for the family so that he can receive future cycles locally.     I would not recommend intensive induction chemotherapy (ie, CPX-351) given TP53 mutation which confers resistance and has poor outcomes with induction chemotherapy. Further, his performance status and comorbidities would increase his risk of complications substantially.     Plans and Recommendations:  1) PPx Abx- Levaquin 500 mg daily and Valtrex 500 mg daily  2) Will start Posaconazole 300 mg daily  on day 8 after dose ramp-up  3) Azacitidine 75 mg/m2 SQ days 1-7 + Venetoclax in the following ramp-up (Day 1: 100 mg, Day 2: 200 mg, Day 3-7: 400 mg, Day 8+: 100 mg daily)  4) 1 unit PRBC today, 1 unit tomorrow  5) Labs locally with Dr. Cathie Hoops next week  6) Referral to [facility] History Overview Note   Diagnosis: AML with MDS-Related Changes    Presentation: Pancytopenia    Final Diagnosis   Date Value Ref Range Status   01/15/2019   Final    (Outside Case #:  [id number], dated 01/15/2019)  Bone marrow,  aspiration and biopsy  -  Hypercellular bone marrow (70%) involved by acute myeloid leukemia (30% blasts by manual aspirate differential) (See Comment)        This electronic signature is attestation that the pathologist personally reviewed the submitted material(s) and the final diagnosis reflects that evaluation.          Cytogenetics: Complex/Monosomal Karyotype with Del(5q), -17    Flow cytometry: CD7+, CD33+, CD34+, CD38+, CD117+, CD123+, HLA-DR+     Acute myeloid leukemia not having achieved remission (CMS-HCC)   01/15/2019 Biopsy    Final Diagnosis   Date Value Ref Range Status   01/15/2019   Final    (Outside Case #:  [id number], dated 01/15/2019)  Bone marrow,  aspiration and biopsy  -  Hypercellular bone marrow (70%) involved by acute myeloid leukemia (30% blasts by manual aspirate differential) (See Comment)        This electronic signature is attestation that the pathologist personally reviewed the submitted material(s) and the final diagnosis reflects that evaluation.          Cytogenetics: Complex Karyotype with Del(5q), -17     01/23/2019 Initial Diagnosis    Acute myeloid leukemia not having achieved remission (CMS-HCC)     02/10/2019 -  Chemotherapy    OP AML AZACITIDINE + VENETOCLAX  azacitidine 75 mg/m2 SQ on days 1-7, venetoclax ramp up week 1; dose dependent, then azacitidine 75 mg/m2 SQ on days 1-7 every 28 days.           Cindra Presume, MD

## 2019-01-31 NOTE — Unmapped (Signed)
Pt here for blood transfusion; however to infusion without IV or Type and Screen or ABO @ 1630. Only able to give 1 unit due to time constraints, provider notified and pt to get another unit w/ his local oncologist tomorrow.  Pt tolerated transfusion well. PIV D/C'd intact per protocol. Pt discharge from clinic stable; via wheelchair and accompanied by his sister.

## 2019-02-01 ENCOUNTER — Ambulatory Visit: Admit: 2019-02-01 | Discharge: 2019-02-02 | Payer: MEDICARE

## 2019-02-01 DIAGNOSIS — C9200 Acute myeloblastic leukemia, not having achieved remission: Secondary | ICD-10-CM

## 2019-02-01 DIAGNOSIS — Z79899 Other long term (current) drug therapy: Secondary | ICD-10-CM

## 2019-02-01 DIAGNOSIS — D649 Anemia, unspecified: Secondary | ICD-10-CM

## 2019-02-01 DIAGNOSIS — R109 Unspecified abdominal pain: Secondary | ICD-10-CM

## 2019-02-01 LAB — CBC W/ AUTO DIFF
BASOPHILS ABSOLUTE COUNT: 0.2 10*9/L — ABNORMAL HIGH (ref 0.0–0.1)
BASOPHILS RELATIVE PERCENT: 4.2 %
EOSINOPHILS ABSOLUTE COUNT: 0.1 10*9/L (ref 0.0–0.4)
EOSINOPHILS RELATIVE PERCENT: 1.5 %
HEMATOCRIT: 18.6 % — ABNORMAL LOW (ref 41.0–53.0)
HEMOGLOBIN: 6.1 g/dL — ABNORMAL LOW (ref 13.5–17.5)
LYMPHOCYTES RELATIVE PERCENT: 35.5 %
MEAN CORPUSCULAR HEMOGLOBIN CONC: 33 g/dL (ref 31.0–37.0)
MEAN CORPUSCULAR HEMOGLOBIN: 34.2 pg — ABNORMAL HIGH (ref 26.0–34.0)
MEAN CORPUSCULAR VOLUME: 103.4 fL — ABNORMAL HIGH (ref 80.0–100.0)
MEAN PLATELET VOLUME: 11.7 fL — ABNORMAL HIGH (ref 7.0–10.0)
MONOCYTES ABSOLUTE COUNT: 0.3 10*9/L (ref 0.2–0.8)
MONOCYTES RELATIVE PERCENT: 8.5 %
NEUTROPHILS ABSOLUTE COUNT: 1.1 10*9/L — ABNORMAL LOW (ref 2.0–7.5)
NEUTROPHILS RELATIVE PERCENT: 35.5 %
NUCLEATED RED BLOOD CELLS: 16 /100{WBCs} — ABNORMAL HIGH (ref <=4)
PLATELET COUNT: 32 10*9/L — ABNORMAL LOW (ref 150–440)
RED BLOOD CELL COUNT: 1.8 10*12/L — ABNORMAL LOW (ref 4.50–5.90)
RED CELL DISTRIBUTION WIDTH: 18.5 % — ABNORMAL HIGH (ref 12.0–15.0)
WBC ADJUSTED: 3.2 10*9/L — ABNORMAL LOW (ref 4.5–11.0)

## 2019-02-01 LAB — EOSINOPHILS RELATIVE PERCENT: Lab: 1.5

## 2019-02-01 LAB — SMEAR REVIEW

## 2019-02-01 MED ORDER — DOCUSATE SODIUM 100 MG TABLET
ORAL_TABLET | Freq: Two times a day (BID) | ORAL | 3 refills | 30.00000 days | Status: SS
Start: 2019-02-01 — End: 2019-02-12

## 2019-02-01 MED ORDER — SENNA LAX 8.6 MG TABLET
ORAL_TABLET | Freq: Every evening | ORAL | 3 refills | 30.00000 days | Status: SS | PRN
Start: 2019-02-01 — End: 2019-02-12

## 2019-02-01 MED ORDER — POLYETHYLENE GLYCOL 3350 17 GRAM ORAL POWDER PACKET
PACK | Freq: Every day | ORAL | 3 refills | 30.00000 days | Status: SS
Start: 2019-02-01 — End: 2019-02-12

## 2019-02-01 NOTE — Unmapped (Signed)
Pt arrived to infusion in NAD. Labs reviewed, orders released. Pt tolerated transfusions without complication. AVS given to pt, discharged from infusion in NAD.

## 2019-02-01 NOTE — Unmapped (Addendum)
FOR CONSTIPATION:    Docusate sodium (colace) 100mg  by mouth twice a day (may decrease if liquid stool)    Polyethylene glycol (miralax) Drink one capful (17 grams) in 4-8 ounces of warm or cold beverage once a day (may take up to three times a day for constipation)     Senna Take 2 tablets po once daily, preferable at bedtime if no BM during day    Magnesium Citrate ?? bottle for no bowel movement in 3-5 days    GO to emergency department for fever > 100.83F or uncontrolled pain.    I sent prescriptions for colace, miralax, and senna to the [facility] in [place]. These are meant to be adjusted for loose stool and constipation as needed.     Try and increase the tramadol to 1 tablet every 8 hours as needed for pain to see if that will help with the abdominal pain.    I will send the referral for the nutritionist to reach out to [facility] [place].    I will also send a message to the nurse navigator [name] to reach out to [place]. Her note from yesterday mentions reaching out to social work for resources so they may already be looking into options.       If you feel like this is an emergency please call 911.  For appointments or questions Monday through Friday 8AM-5PM please call [phone] or Toll Free [phone]. For Medical questions or concerns ask for the Nurse Triage Line.  On Nights, Weekends, and Holidays call [phone] and ask for the Oncologist on Call.  Reasons to call the Nurse Triage Line:  Fever of 100.5 or greater  Nausea and/or vomiting not relived with nausea medicine  Diarrhea or constipation  Severe pain not relieved with usual pain regimen  Shortness of breath  Uncontrolled bleeding  Mental status changes    Hospital Outpatient Visit on 02/01/2019   Component Date Value Ref Range Status   ??? WBC 02/01/2019 3.2* 4.5 - 11.0 10*9/L Final   ??? RBC 02/01/2019 1.80* 4.50 - 5.90 10*12/L Final   ??? HGB 02/01/2019 6.1* 13.5 - 17.5 g/dL Final   ??? HCT 57/84/6962 18.6* 41.0 - 53.0 % Final   ??? MCV 02/01/2019 103.4* 80.0 - 100.0 fL Final   ??? MCH 02/01/2019 34.2* 26.0 - 34.0 pg Final   ??? MCHC 02/01/2019 33.0  31.0 - 37.0 g/dL Final   ??? RDW 95/28/4132 18.5* 12.0 - 15.0 % Final   ??? MPV 02/01/2019 11.7* 7.0 - 10.0 fL Final   ??? Platelet 02/01/2019 32* 150 - 440 10*9/L Final   ??? nRBC 02/01/2019 16* <=4 /100 WBCs Final   ??? Neutrophils % 02/01/2019 35.5  % Final   ??? Lymphocytes % 02/01/2019 35.5  % Final   ??? Monocytes % 02/01/2019 8.5  % Final   ??? Eosinophils % 02/01/2019 1.5  % Final   ??? Basophils % 02/01/2019 4.2  % Final   ??? Neutrophil Left Shift 02/01/2019 2+* Not Present Final   ??? Absolute Neutrophils 02/01/2019 1.1* 2.0 - 7.5 10*9/L Final   ??? Absolute Lymphocytes 02/01/2019 1.1* 1.5 - 5.0 10*9/L Final   ??? Absolute Monocytes 02/01/2019 0.3  0.2 - 0.8 10*9/L Final   ??? Absolute Eosinophils 02/01/2019 0.1  0.0 - 0.4 10*9/L Final   ??? Absolute Basophils 02/01/2019 0.2* 0.0 - 0.1 10*9/L Final   ??? Large Unstained Cells 02/01/2019 15* 0 - 4 % Final    Blasts Present.    ???  Macrocytosis 02/01/2019 Marked* Not Present Final   ??? Anisocytosis 02/01/2019 Moderate* Not Present Final   ??? Hypochromasia 02/01/2019 Slight* Not Present Final   ??? Crossmatch 02/01/2019 Compatible   Final   ??? Unit Blood Type 02/01/2019 A Pos   Final   ??? ISBT Number 02/01/2019 [id number]   Final   ??? Unit # 02/01/2019 [id number]   Final   ??? Status 02/01/2019 Issued   Final   ??? Spec Expiration 02/01/2019 [id number]   Final   ??? Product ID 02/01/2019 Red Blood Cells   Final   ??? PRODUCT CODE 02/01/2019 [id number]   Final   ??? Crossmatch 02/01/2019 Compatible   Final   ??? Unit Blood Type 02/01/2019 A Pos   Final   ??? ISBT Number 02/01/2019 [id number]   Final   ??? Unit # 02/01/2019 [id number]   Final   ??? Status 02/01/2019 Issued   Final   ??? Spec Expiration 02/01/2019 [id number]   Final   ??? Product ID 02/01/2019 Red Blood Cells   Final   ??? PRODUCT CODE 02/01/2019 [id number]   Final   ??? Smear Review Comments 02/01/2019 See Comment* Undefined Final    Smear Reviewed Blasts Present.  Myelocytes present.  Agranular neutrophils present.

## 2019-02-02 DIAGNOSIS — R0902 Hypoxemia: Secondary | ICD-10-CM | POA: Diagnosis not present

## 2019-02-02 DIAGNOSIS — R0602 Shortness of breath: Secondary | ICD-10-CM | POA: Diagnosis not present

## 2019-02-02 DIAGNOSIS — R0689 Other abnormalities of breathing: Secondary | ICD-10-CM | POA: Diagnosis not present

## 2019-02-02 DIAGNOSIS — R410 Disorientation, unspecified: Secondary | ICD-10-CM | POA: Diagnosis not present

## 2019-02-03 ENCOUNTER — Other Ambulatory Visit: Payer: Self-pay

## 2019-02-03 ENCOUNTER — Inpatient Hospital Stay: Payer: Medicare HMO

## 2019-02-03 ENCOUNTER — Inpatient Hospital Stay (HOSPITAL_BASED_OUTPATIENT_CLINIC_OR_DEPARTMENT_OTHER): Payer: Medicare HMO | Admitting: Oncology

## 2019-02-03 ENCOUNTER — Other Ambulatory Visit: Payer: Self-pay | Admitting: Oncology

## 2019-02-03 ENCOUNTER — Encounter: Payer: Self-pay | Admitting: Oncology

## 2019-02-03 ENCOUNTER — Ambulatory Visit
Admission: RE | Admit: 2019-02-03 | Discharge: 2019-02-03 | Disposition: A | Discharge status: Home / Self Care | Payer: Medicare HMO | Source: Ambulatory Visit | Attending: Oncology | Admitting: Oncology

## 2019-02-03 VITALS — BP 124/68 | HR 82 | Temp 97.1°F | Resp 20 | Wt 227.3 lb

## 2019-02-03 DIAGNOSIS — D709 Neutropenia, unspecified: Secondary | ICD-10-CM | POA: Diagnosis not present

## 2019-02-03 DIAGNOSIS — D696 Thrombocytopenia, unspecified: Secondary | ICD-10-CM

## 2019-02-03 DIAGNOSIS — C9200 Acute myeloblastic leukemia, not having achieved remission: Secondary | ICD-10-CM

## 2019-02-03 DIAGNOSIS — R0602 Shortness of breath: Secondary | ICD-10-CM

## 2019-02-03 DIAGNOSIS — J449 Chronic obstructive pulmonary disease, unspecified: Secondary | ICD-10-CM | POA: Diagnosis not present

## 2019-02-03 DIAGNOSIS — D649 Anemia, unspecified: Secondary | ICD-10-CM

## 2019-02-03 DIAGNOSIS — R0902 Hypoxemia: Secondary | ICD-10-CM

## 2019-02-03 DIAGNOSIS — R1010 Upper abdominal pain, unspecified: Secondary | ICD-10-CM | POA: Diagnosis not present

## 2019-02-03 DIAGNOSIS — D61818 Other pancytopenia: Secondary | ICD-10-CM | POA: Diagnosis not present

## 2019-02-03 DIAGNOSIS — R634 Abnormal weight loss: Secondary | ICD-10-CM | POA: Diagnosis not present

## 2019-02-03 DIAGNOSIS — D539 Nutritional anemia, unspecified: Secondary | ICD-10-CM | POA: Diagnosis not present

## 2019-02-03 DIAGNOSIS — R5382 Chronic fatigue, unspecified: Secondary | ICD-10-CM | POA: Diagnosis not present

## 2019-02-03 LAB — COMPREHENSIVE METABOLIC PANEL
ALT: 14 U/L (ref 0–44)
AST: 27 U/L (ref 15–41)
Albumin: 3.3 g/dL — ABNORMAL LOW (ref 3.5–5.0)
Alkaline Phosphatase: 72 U/L (ref 38–126)
Anion gap: 12 (ref 5–15)
BUN: 20 mg/dL (ref 8–23)
CO2: 33 mmol/L — ABNORMAL HIGH (ref 22–32)
Calcium: 7.8 mg/dL — ABNORMAL LOW (ref 8.9–10.3)
Chloride: 89 mmol/L — ABNORMAL LOW (ref 98–111)
Creatinine, Ser: 0.81 mg/dL (ref 0.61–1.24)
GFR calc Af Amer: >60 mL/min (ref >60)
GFR calc non Af Amer: >60 mL/min (ref >60)
Glucose, Bld: 99 mg/dL (ref 70–99)
Potassium: 3.3 mmol/L — ABNORMAL LOW (ref 3.5–5.1)
Sodium: 134 mmol/L — ABNORMAL LOW (ref 135–145)
Total Bilirubin: 1.3 mg/dL — ABNORMAL HIGH (ref 0.3–1.2)
Total Protein: 6.9 g/dL (ref 6.5–8.1)

## 2019-02-03 LAB — CBC WITH DIFFERENTIAL/PLATELET
Abs Immature Granulocytes: 0.2 10*3/uL — ABNORMAL HIGH (ref 0.00–0.07)
Basophils Absolute: 0.1 10*3/uL (ref 0.0–0.1)
Basophils Relative: 2 %
Blasts: 12 %
Eosinophils Absolute: 0 10*3/uL (ref 0.0–0.5)
Eosinophils Relative: 0 %
HCT: 20.8 % — ABNORMAL LOW (ref 39.0–52.0)
Hemoglobin: 7.1 g/dL — ABNORMAL LOW (ref 13.0–17.0)
Lymphocytes Relative: 54 %
Lymphs Abs: 1.6 10*3/uL (ref 0.7–4.0)
MCH: 32 pg (ref 26.0–34.0)
MCHC: 34.1 g/dL (ref 30.0–36.0)
MCV: 93.7 fL (ref 80.0–100.0)
Metamyelocytes Relative: 4 %
Monocytes Absolute: 0.1 10*3/uL (ref 0.1–1.0)
Monocytes Relative: 5 %
Myelocytes: 4 %
Neutro Abs: 0.6 10*3/uL — ABNORMAL LOW (ref 1.7–7.7)
Neutrophils Relative %: 19 %
Platelets: 14 10*3/uL — CL (ref 150–400)
RBC: 2.22 MIL/uL — ABNORMAL LOW (ref 4.22–5.81)
RDW: 17.2 % — ABNORMAL HIGH (ref 11.5–15.5)
WBC Morphology: 12
WBC: 2.9 10*3/uL — ABNORMAL LOW (ref 4.0–10.5)
nRBC: 13.9 % — ABNORMAL HIGH (ref 0.0–0.2)

## 2019-02-03 LAB — PREPARE RBC (CROSSMATCH)

## 2019-02-03 LAB — SAMPLE TO BLOOD BANK

## 2019-02-03 MED ORDER — DIPHENHYDRAMINE HCL 25 MG PO CAPS
25.0000 mg | ORAL_CAPSULE | Freq: Once | ORAL | Status: AC
  Administered 2019-02-03: 14:00:00 25 mg via ORAL
  Filled 2019-02-03: qty 1

## 2019-02-03 MED ORDER — IOHEXOL 350 MG/ML SOLN
75.0000 mL | Freq: Once | INTRAVENOUS | Status: AC | PRN
  Administered 2019-02-03: 75 mL via INTRAVENOUS

## 2019-02-03 MED ORDER — SODIUM CHLORIDE 0.9% IV SOLUTION
250.0000 mL | Freq: Once | INTRAVENOUS | Status: AC
  Administered 2019-02-03: 14:00:00 250 mL via INTRAVENOUS
  Filled 2019-02-03: qty 250

## 2019-02-03 MED ORDER — ACETAMINOPHEN 325 MG PO TABS
650.0000 mg | ORAL_TABLET | Freq: Once | ORAL | Status: AC
  Administered 2019-02-03: 14:00:00 650 mg via ORAL
  Filled 2019-02-03: qty 2

## 2019-02-03 NOTE — Progress Notes (Signed)
cbc

## 2019-02-03 NOTE — Progress Notes (Signed)
Hematology/Oncology follow up note [facility] [phone] [phone]   Patient Care Team: McLean-Scocuzza, Nino Glow, MD as PCP - General (Internal Medicine) Leona Singleton, RN as [place] [facility]  REFERRING PROVIDER: McLean-Scocuzza, Olivia Mackie *  CHIEF COMPLAINTS/REASON FOR VISIT:  Follow up for supportive care for AML.   HISTORY OF PRESENTING ILLNESS:   Nicholas Reyes is a  63 y.o.  male with PMH listed below was seen in consultation at the request of  McLean-Scocuzza, Olivia Mackie *  for evaluation of leukopenia and thrombocytopenia Patient recently had blood work done at the primary care provider's office. 12/12/2018 CBC showed WBC 2.9, platelet count 64,000. 12/17/2018, WBC 2.8, predominantly neutropenia ANC 1.19 platelet count 55,000. Normal LDH, normal folic acid and X10 level, normal PSA normal uric acid level. Negative HIV negative  Path review showed a few lymphocytes.  Reactive.  No immature cells are identified.  Favor reactive process.  Thrombocytopenia no platelet clumps identified. 11/15/2017, hepatitis C antibody negative, hepatitis B surface antigen negative.  Patient is a poor historian.  He has no complaints today.  Denies fever, chills, unintentional weight loss, night sweating.  Chronic fatigue and shortness of breath.  # Patient was last seen by me on 12/19/2018 for work-up of leukopenia and thrombocytopenia. Patient left the clinic without having blood work done.  And our scheduler has been having difficulty reaching out to him and reschedule blood work and other procedures.. Patient eventually have the blood work done which was reviewed by me. CBC showed mild anemia with hemoglobin of 12.1, leukopenia with ANC 0.8, platelet count 47,000 Peripheral smear showed 8% blasts, possible MDS versus acute leukemia. Patient was contacted and recommended to proceed with CT-guided bone marrow biopsy.   Patient presented  to emergency room on 12/31/2018 reporting left upper lobe pain for 2 days. Patient also establish care with gastroenterology for chronic abdominal pain. Was seen by Dr. Vicente Males on 01/08/2019 and was recommended to have EGD and colonoscopy.  Recommend patient to continue PPI.  Bentyl 4 times daily as needed for pain. Patient eventually got bone marrow biopsy done on 01/15/2019. Today he present to discuss results.  Patient reports feeling tired and fatigued.  Decrease of appetite.  He has lost 2 pounds since 12/19/2018. Denies any fever, chills, nausea, vomiting.  Patient has chronic upper abdominal pain and has been advised to stop NSAID use. Has a history of CAD with stents placement.  He follows up with cardiology Dr. Saralyn Pilar Patient had a catheterization on 01/30/2018 which reviewed normal left ventricular function and insignificant coronary artery disease, patent stents to the proximal and mid LAD, and OM1  # 01/15/2019 bone marrow biopsy results from with patient. Patient has hypercellular marrow with multilineage dysplasia and an increased blast 20%, consistent with acute myeloid leukemia.  Peripheral blood showed pancytopenia with circulating blasts 7%. I discussed with pathologist Dr. Melina Copa.  Cytogenetics AML FISH were sent on 9/14 2020.  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 T p53 mutation pending.  INTERVAL HISTORY Nicholas Reyes is a 63 y.o. 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 #Patient was scheduled to have blood transfusions for symptomatic anemia today.  He reported to nurse that he experienced acute onset shortness of breath over the weekend.  He went to fire department and was found to have pulse  ox in the 70s.  Refused to go to emergency room. He got blood transfusion 2 days ago at [facility], 2 units of PRBC. Today he  continues to feel shortness of breath which alarmed RN.  Patient was added to my schedule for an acute visit. Reports profound weekend fatigue.  Lack of appetite. Not eating much. Denies any cough, fever, hemoptysis, chest pain, abdominal pain. Continues to have shortness of breath, worse with exertion. He shaved his beard and eyebrows and had multiple small erythema cuts on his face. Patient has establish care with [facility] malignant hematology Dr. Janene Madeira and was recommended to start Venetoclax and azacitidine treatments.  Patient lives by himself.  He decides to move to [place] to live with his sister to continue with his oncology treatments.   Review of Systems  Constitutional: Positive for appetite change, fatigue and unexpected weight change. Negative for chills and fever.  HENT:   Negative for hearing loss and voice change.   Eyes: Negative for eye problems and icterus.  Respiratory: Negative for chest tightness, cough and shortness of breath.   Cardiovascular: Negative for chest pain and leg swelling.  Gastrointestinal: Negative for abdominal distention, abdominal pain and blood in stool.  Endocrine: Negative for hot flashes.  Genitourinary: Negative for difficulty urinating, dysuria and frequency.   Musculoskeletal: Negative for arthralgias.  Skin: Negative for itching and rash.       Small cuts secondary to shaving  Neurological: Negative for extremity weakness, light-headedness and numbness.  Hematological: Negative for adenopathy. Bruises/bleeds easily.  Psychiatric/Behavioral: Negative for confusion.     MEDICAL HISTORY:  Past Medical History:  Diagnosis Date   Arthritis    Asthma    CAD (coronary artery disease)    COPD (chronic obstructive pulmonary disease) (HCC)    Depression    Diabetes mellitus    Dyspnea    with exertion   GERD (gastroesophageal reflux disease)    Heart disease    cad with 3 stents    Hypertension    MI (myocardial infarction)  ([place])    3 stents   Migraines    migraines   Psoriasis    joint pain in knees left hand    Sleep apnea    cpap    SURGICAL HISTORY: Past Surgical History:  Procedure Laterality Date   APPENDECTOMY     CATARACT EXTRACTION Bilateral    inserted lens b/l eyes right 03/12/17, left 05/28/17    CHOLECYSTECTOMY     CORONARY ANGIOPLASTY WITH STENT PLACEMENT     LAD, LCX-OM, ? 3rd location   EYE SURGERY     cataract b/l    HEMORRHOID SURGERY     hemorrhoid surgery      x 2    KNEE SURGERY     right knee arthroscopy, partial synovectecomy 04/21/15 and meniscectomy/chondroplasty    LEFT HEART CATH AND CORONARY ANGIOGRAPHY Left 01/30/2018   Procedure: LEFT HEART CATH AND CORONARY ANGIOGRAPHY;  Surgeon: Isaias Cowman, MD;  Location: [facility];  Service: Cardiovascular;  Laterality: Left;   right knee surgery     ? type of procedure    STENT PLACEMENT VASCULAR ([facility] HX)     TONSILLECTOMY     TONSILLECTOMY AND ADENOIDECTOMY      SOCIAL HISTORY: Social History   Socioeconomic History   Marital status: Single    Spouse name: Not on file   Number of children: 0   Years of education: Not on file   Highest education level: Not  on file  Occupational History   Not on file  Social Needs   Financial resource strain: Not on file   Food insecurity    Worry: Not on file    Inability: Not on file   Transportation needs    Medical: Not on file    Non-medical: Not on file  Tobacco Use   Smoking status: Former Smoker    Years: 5.00    Types: Cigarettes   Smokeless tobacco: Former Systems developer    Quit date: 12/19/2015   Tobacco comment: 3-4 cig every couple days  Substance and Sexual Activity   Alcohol use: No    Frequency: Never   Drug use: No   Sexual activity: Not on file  Lifestyle   Physical activity    Days per week: Not on file    Minutes per session: Not on file   Stress: Not on file  Relationships   Social connections    Talks  on phone: Not on file    Gets together: Not on file    Attends religious service: Not on file    Active member of club or organization: Not on file    Attends meetings of clubs or organizations: Not on file    Relationship status: Not on file   Intimate partner violence    Fear of current or ex partner: Not on file    Emotionally abused: Not on file    Physically abused: Not on file    Forced sexual activity: Not on file  Other Topics Concern   Not on file  Social History Narrative   Former smoker quit 2013/2014 used to smoke<1 ppd x 2-3 years    No kids   Single not sexually active    Unable to read/write    Has dogs and cats at home    No guns   Wears seat belt     FAMILY HISTORY: Family History  Problem Relation Age of Onset   Cancer Mother    Heart disease Father    Stroke Father    Heart disease Brother     ALLERGIES:  has No Known Allergies.  MEDICATIONS:  Current Outpatient Medications  Medication Sig Dispense Refill   acetaminophen (TYLENOL) 500 MG tablet Take 1 tablet (500 mg total) by mouth every 6 (six) hours as needed. 30 tablet 0   albuterol (VENTOLIN HFA) 108 (90 Base) MCG/ACT inhaler Inhale 1-2 puffs into the lungs every 6 (six) hours as needed. For shortness of breath 18 g 11   allopurinol (ZYLOPRIM) 300 MG tablet Take by mouth.     amLODipine (NORVASC) 5 MG tablet Take 5 mg by mouth daily.     atorvastatin (LIPITOR) 20 MG tablet Take 1 tablet (20 mg total) by mouth daily at 6 PM. 90 tablet 3   budesonide-formoterol (SYMBICORT) 160-4.5 MCG/ACT inhaler Inhale 2 puffs into the lungs 2 (two) times daily. Rinse mouth 1 Inhaler 12   cetirizine (ZYRTEC) 10 MG tablet Take 10 mg by mouth daily.     Cholecalciferol 50000 units capsule Take 1 capsule (50,000 Units total) by mouth once a week. 13 capsule 1   clobetasol ointment (TEMOVATE) 7.41 % Apply 1 application topically 2 (two) times daily. Right ear as needed not face or private 45 g 0    clotrimazole (LOTRIMIN) 1 % cream Apply 1 application topically 2 (two) times daily. 60 g 11   diclofenac (FLECTOR) 1.3 % PTCH Place 1 patch onto the skin 2 (two) times daily.  60 patch 2   dicyclomine (BENTYL) 10 MG capsule Take 1 capsule (10 mg total) by mouth 4 (four) times daily -  before meals and at bedtime. 90 capsule 0   Docusate Sodium 100 MG capsule Take by mouth.     DULoxetine (CYMBALTA) 60 MG capsule Take 1 capsule (60 mg total) by mouth daily. 90 capsule 3   fluticasone (FLONASE) 50 MCG/ACT nasal spray Place 2 sprays into both nostrils daily. 16 g 6   furosemide (LASIX) 40 MG tablet Take 1 tablet (40 mg total) by mouth 2 (two) times daily. In am and at lunch 60 tablet 5   glucose blood test strip 1 each by Other route 3 (three) times daily as needed. DX- E11.9 Diabetes Mellitus type 2. 420 each 2   hydrocortisone 2.5 % cream Apply topically 2 (two) times daily. Left face left of eye 60 g 0   hydrOXYzine (ATARAX/VISTARIL) 25 MG tablet Take 1 tablet (25 mg total) by mouth 3 (three) times daily as needed. 30 tablet 0   isosorbide mononitrate (IMDUR) 30 MG 24 hr tablet Take 1 tablet (30 mg total) by mouth daily. 90 tablet 3   ketorolac (ACULAR) 0.5 % ophthalmic solution      levofloxacin (LEVAQUIN) 500 MG tablet Take by mouth.     metFORMIN (GLUCOPHAGE) 500 MG tablet Take 1 tablet (500 mg total) by mouth daily with breakfast. 90 tablet 3   metoprolol tartrate (LOPRESSOR) 25 MG tablet Take 25 mg by mouth 2 (two) times daily.     montelukast (SINGULAIR) 10 MG tablet Take 10 mg by mouth at bedtime.     mupirocin ointment (BACTROBAN) 2 % Apply 1 application topically 2 (two) times daily. Any open sores 60 g 2   nitroGLYCERIN (NITROSTAT) 0.4 MG SL tablet Place 0.4 mg under the tongue every 5 (five) minutes as needed. For chest pain     polyethylene glycol (MIRALAX / GLYCOLAX) 17 g packet Take by mouth.     posaconazole (NOXAFIL) 100 MG TBEC delayed-release tablet Take by  mouth.     potassium chloride SA (K-DUR,KLOR-CON) 20 MEQ tablet Take 1 tablet (20 mEq total) by mouth daily. 90 tablet 1   prednisoLONE acetate (PRED FORTE) 1 % ophthalmic suspension      Secukinumab (COSENTYX 300 DOSE [place]) Inject into the skin every 30 (thirty) days.     senna (SENNA-LAX) 8.6 MG tablet Take by mouth.     spironolactone (ALDACTONE) 25 MG tablet Take 1 tablet (25 mg total) by mouth daily. In am for blood pressure 90 tablet 3   sucralfate (CARAFATE) 1 g tablet Take 1 tablet (1 g total) by mouth 4 (four) times daily -  with meals and at bedtime. 120 tablet 0   traMADol (ULTRAM) 50 MG tablet Take by mouth.     triamcinolone cream (KENALOG) 0.1 % Apply 1 application topically 2 (two) times daily. Not face, groin, underarms 454 g 11   valACYclovir (VALTREX) 500 MG tablet Take by mouth.     venetoclax 100 MG TABS Take by mouth.     omeprazole (PRILOSEC) 40 MG capsule Take 1 capsule (40 mg total) by mouth daily. (Patient not taking: Reported on 01/08/2019) 90 capsule 3   ondansetron (ZOFRAN) 4 MG tablet Take 1 tablet (4 mg total) by mouth every 8 (eight) hours as needed for nausea or vomiting. (Patient not taking: Reported on 01/08/2019) 20 tablet 0   oxyCODONE-acetaminophen (PERCOCET) 5-325 MG tablet Take 1 tablet by mouth 2 (  two) times daily as needed for severe pain. (Patient not taking: Reported on 12/19/2018) 10 tablet 0   pantoprazole (PROTONIX) 20 MG tablet TAKE 1 TABLET EVERY DAY (Patient not taking: Reported on 01/08/2019) 90 tablet 0   No current facility-administered medications for this visit.      PHYSICAL EXAMINATION: ECOG PERFORMANCE STATUS: 1 - Symptomatic but completely ambulatory Vitals:   02/03/19 1000  BP: 124/68  Pulse: 82  Resp: 20  Temp: (!) 97.1 F (36.2 C)  SpO2: 98%   Filed Weights   02/03/19 1000  Weight: 227 lb 4.8 oz (103.1 kg)   Physical Exam  Constitutional: He is oriented to person, place, and time. No distress.  Frail appearance    HENT:  Head: Normocephalic and atraumatic.  Nose: Nose normal.  Mouth/Throat: Oropharynx is clear and moist. No oropharyngeal exudate.  Eyes: Pupils are equal, round, and reactive to light. EOM are normal. No scleral icterus.  Neck: Normal range of motion. Neck supple.  Cardiovascular: Normal rate, regular rhythm and normal heart sounds.  No murmur heard. Pulmonary/Chest: Effort normal. No respiratory distress. He has no wheezes. He has no rales. He exhibits no tenderness.  Abdominal: Soft. Bowel sounds are normal. He exhibits no distension and no mass. There is no abdominal tenderness.  Limited abdominal exam due to obesity.  Musculoskeletal: Normal range of motion.        General: Edema present. No deformity.  Neurological: He is alert and oriented to person, place, and time. No cranial nerve deficit. He exhibits normal muscle tone. Coordination normal.  Skin: Skin is warm and dry. No rash noted. He is not diaphoretic. No erythema.  Scattered bruises. Multiple small facial cuts secondary to shaving beard and eyebrows.  Psychiatric: Affect normal.       LABORATORY DATA:  I have reviewed the data as listed Lab Results  Component Value Date   WBC 2.9 (L) 02/03/2019   HGB 7.1 (L) 02/03/2019   HCT 20.8 (L) 02/03/2019   MCV 93.7 02/03/2019   PLT 14 (LL) 02/03/2019   Recent Labs    10/10/18 1314 12/12/18 1242 12/17/18 1645 12/31/18 1108 02/03/19 0924  NA 136 139  --  140 134*  K 3.7 3.7  --  3.8 3.3*  CL 94* 98  --  100 89*  CO2 31 34*  --  31 33*  GLUCOSE 78 116*  --  160* 99  BUN 18 10  --  12 20  CREATININE 1.05 0.70  --  0.76 0.81  CALCIUM 8.4* 8.8*  --  8.6* 7.8*  GFRNONAA >60 >60  --  >60 >60  GFRAA >60 >60  --  >60 >60  PROT 6.9  --  5.5* 6.2* 6.9  ALBUMIN 3.8  --   --  3.4* 3.3*  AST 15  --  13 19 27   ALT 19  --  11 17 14   ALKPHOS 70  --   --  52 72  BILITOT 0.5  --  0.6 0.7 1.3*  BILIDIR  --   --  0.1  --   --   IBILI  --   --  0.5  --   --     Iron/TIBC/Ferritin/ %Sat No results found for: IRON, TIBC, FERRITIN, IRONPCTSAT    RADIOGRAPHIC STUDIES: I have personally reviewed the radiological images as listed and agreed with the findings in the report. Dg Knee 1-2 Views Right  Result Date: 01/27/2019 CLINICAL DATA:  Right knee pain for the past  1-2 years. History of arthritis. No known injury EXAM: RIGHT KNEE - 1-2 VIEW COMPARISON:  None. FINDINGS: No fracture or dislocation. 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 Several small loose bodies are seen about the posterior aspect of the knee joint space though discrete donor sites are not identified. No joint effusion or evidence of lipohemarthrosis. No evidence of chondrocalcinosis. Regional soft tissues appear normal. 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 Electronically Signed   By: Sandi Mariscal M.D.   On: 01/27/2019 18:11   Ct Angio Chest Pe W Or Wo Contrast  Result Date: 02/03/2019 CLINICAL DATA:  Acute hypoxia EXAM: CT ANGIOGRAPHY CHEST WITH CONTRAST TECHNIQUE: Multidetector CT imaging of the chest was performed using the standard protocol during bolus administration of intravenous contrast. Multiplanar CT image reconstructions and MIPs were obtained to evaluate the vascular anatomy. CONTRAST:  63m OMNIPAQUE IOHEXOL 350 MG/ML SOLN COMPARISON:  None. FINDINGS: Cardiovascular: Satisfactory opacification of the pulmonary arteries to the segmental level. No evidence of pulmonary embolism. Normal heart size. Trace pericardial effusion. Mediastinum/Nodes: No enlarged mediastinal, hilar, or axillary lymph nodes. Thyroid gland, trachea, and esophagus demonstrate no significant findings. Lungs/Pleura: Lungs are clear. No pleural effusion or pneumothorax. Upper Abdomen: No acute  abnormality. Musculoskeletal: No acute osseous abnormality. No aggressive osseous lesion. Review of the MIP images confirms the above findings. IMPRESSION: 1. No evidence of pulmonary embolus. 2. No acute cardiopulmonary disease. Electronically Signed   By: HKathreen Devoid  On: 02/03/2019 12:22   Dg Chest Port 1 View  Result Date: 12/31/2018 CLINICAL DATA:  Upper abdominal pain. EXAM: PORTABLE CHEST 1 VIEW COMPARISON:  Radiographs of August 05, 2018. FINDINGS: Stable cardiomediastinal silhouette. No pneumothorax or pleural effusion is noted. Both lungs are clear. The visualized skeletal structures are unremarkable. IMPRESSION: No active disease. Electronically Signed   By: JMarijo ConceptionM.D.   On: 12/31/2018 14:26   Ct Bone Marrow Biopsy & Aspiration  Result Date: 01/15/2019 INDICATION: 63year old male with thrombocytopenia EXAM: CT BONE MARROW BIOPSY AND ASPIRATION MEDICATIONS: None. ANESTHESIA/SEDATION: Moderate (conscious) sedation was employed during this procedure. A total of Versed 1.0 mg and Fentanyl 75 mcg was administered intravenously. Moderate Sedation Time: 11 minutes. The patient's level of consciousness and vital signs were monitored continuously by radiology nursing throughout the procedure under my direct supervision. FLUOROSCOPY TIME:  CT COMPLICATIONS: None PROCEDURE: The procedure risks, benefits, and alternatives were explained to the patient. Questions regarding the procedure were encouraged and answered. The patient understands and consents to the procedure. Scout CT of the pelvis was performed for surgical planning purposes. The right posterior pelvis was prepped with Chlorhexidine in a sterile fashion, and a sterile drape was applied covering the operative field. A sterile gown and sterile gloves were used for the procedure. Local anesthesia was provided with 1% Lidocaine. Posterior right iliac bone was targeted for biopsy. The skin and subcutaneous tissues were infiltrated with 1%  lidocaine without epinephrine. A small stab incision was made with an 11 blade scalpel, and an 11 gauge Murphy needle was advanced with CT guidance to the posterior cortex. Manual forced was used to advance the needle through the posterior cortex and the stylet was removed. A bone marrow aspirate was retrieved and passed to a cytotechnologist in the room. The Murphy needle was then advanced without the stylet for a core biopsy. The core biopsy was  retrieved and also passed to a cytotechnologist. Manual pressure was used for hemostasis and a sterile dressing was placed. No complications were encountered no significant blood loss was encountered. Patient tolerated the procedure well. Normal hemodynamics, with 1 episode of hypoxia registering 80-85% which resolved immediately with stimulation from the procedure. IMPRESSION: Status post CT-guided bone marrow biopsy, with tissue specimen sent to pathology for complete histopathologic analysis Signed, Dulcy Fanny. Earleen Newport, DO [facility] Electronically Signed   By: Corrie Mckusick D.O.   On: 01/15/2019 09:56     ASSESSMENT & PLAN:  1. Acute myeloid leukemia not having achieved remission ([place])   2. Hypoxia   3. Symptomatic anemia   4. Thrombocytopenia ([place])   5. Neutropenia, unspecified type ([place])    #Patient is hypoxic with room air pulse ox in the 80s.  Was started on oxygen 2 L. Patient has some lower extremity edema, active malignancy/AML, immobilization due to fatigue, Wells criteria 5.5 points, moderate risk. Will obtain CT chest angiogram to rule out acute PE, or other acute infection which may contribute to his shortness of breath Acute CT chest angiogram showed no PE.  Images were independently reviewed by me.  Hypoxia/shortness of breath questionable secondary to anemia.  #Labs are reviewed and discussed with patient. Symptomatic anemia with hemoglobin 7.1.  We will proceed with 1 unit of PRBC  transfusion today. We will check iron panel.  Suspect GI bleeding.  I have previously advised patient to stay off aspirin and NSAIDs, BC powder. Thrombocytopenia, with mild bleeding/bruising from small cuts on his face.  I recommend patient to be very careful and use non-razor shaver Due to his high bleeding risk.  We will proceed with 1 unit platelet transfusion as well. Repeat blood work in 3 days for possible additional blood and platelet transfusion.  #Have discussed with Dr.Zeidner patient is going to start first cycle of Venetoclax on azacitidine next week at [facility], meanwhile establish care locally just in [place].  We will continue supportive care this week  All questions were answered. The patient knows to call the clinic with any problems questions or concerns.  We spent sufficient time to discuss many aspect of care, questions were answered to patient's satisfaction. Total face to face encounter time for this patient visit was 40 min. >50% of the time was  spent in counseling and coordination of care.     Earlie Server, MD, PhD Hematology Oncology [facility] at [facility] Pager- [phone] 02/03/2019

## 2019-02-03 NOTE — Progress Notes (Signed)
STAT CT has been ordered.  When I told patient that I was going to get a wheelchair to transport him to CT he said he can walk.  I then said we are going to go the "back way" and it will be a long walk to CT.  He then said he drove here and he can walk to his car to drive over to CT.  I explained to him that this was a work in [place] and she wanted him to come back here for blood transfusion and additional lab work.  He said "I know and I will come back".  I then told him it would be quicker and easier for him to let me take him over and he still refused for me to transport him over to CT.

## 2019-02-03 NOTE — Progress Notes (Signed)
Patient's oxygen level in office today is 87% on room air resting, decreased to 82% room air with ambulating.  His oxygen level did improve on 2L O2 with oxygen 95% resting and 87% with ambulating.  Brand with [employer] was contacted and he is bringing equipment over for the patient today.

## 2019-02-03 NOTE — Progress Notes (Deleted)
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  Progressive symptoms of anemia and fatigue with hemoglobin less than 7 on 01/30/2019.  History of dark stools at that time.  He has a sister lives in [place] he says she is not able to care for him so he is planning to stay with his sister [place] 2 [place].  She does not feel she would be able to commit to treatment at [facility] on a clinical trial and for that reason initiation of azacitidine and venetoclax was recommended.  Induction therapy was not recommended given TP 53 mutation which confers resistance and has poor outcomes with induction therapy.  Furthers performance status and comorbidities would increase his risk of complications substantially.  Plan is to start venetoclax and azacitadine on 02/10/2019.  He continues tramadol for pain and is on prophylactic Levaquin 500 mg daily and Valtrex 500 mg daily.  Oncology History   No history exists.    Interval history- Nicholas Reyes, 63 year old male newly diagnosed with AML, not yet having achieved remission, presents to symptom management clinic for complaints of shortness of breath.  This is been an ongoing complaint and he feels that he needs oxygen.  He is also followed by [facility] hematology/oncology.  He received 2 units of PRBCs on 01/31/2019.  On 01/09/2019 he received 2 units of PRBCs and was discharged home.  After leaving [facility], he called EMS due to complaints of  shortness of breath.  EMS responded to his home and he was found to be hypoxic with SPO2 75% on room air.  He was started on oxygen, 4 L via nasal cannula and achieved SPO2 of 98%.  Transportation to ER was recommended however, he refused citing concerns of not having a ride home.  Denies any neurologic complaints. Denies recent fevers or illnesses. Denies any easy bleeding or bruising. Reports good appetite and denies weight loss. Denies chest pain. Denies any nausea, vomiting, constipation, or diarrhea. Denies urinary complaints. Patient offers no further specific complaints today.  ECOG FS:{CHL ONC JH:[id number]}  Review of systems- ROS   Current treatment- ***  No Known Allergies  Past Medical History:  Diagnosis Date   Arthritis    Asthma    CAD (coronary artery disease)    COPD (chronic obstructive pulmonary disease) (HCC)    Depression    Diabetes mellitus    Dyspnea    with exertion   GERD (gastroesophageal reflux disease)    Heart disease    cad with 3 stents    Hypertension    MI (myocardial infarction) ([place])    3 stents   Migraines    migraines   Psoriasis    joint pain in knees left hand    Sleep apnea    cpap    Past Surgical History:  Procedure Laterality Date   APPENDECTOMY     CATARACT EXTRACTION Bilateral    inserted lens b/l eyes right 03/12/17, left 05/28/17    CHOLECYSTECTOMY     CORONARY ANGIOPLASTY WITH  STENT PLACEMENT     LAD, LCX-OM, ? 3rd location   EYE SURGERY     cataract b/l    HEMORRHOID SURGERY     hemorrhoid surgery      x 2    KNEE SURGERY     right knee arthroscopy, partial synovectecomy 04/21/15 and meniscectomy/chondroplasty    LEFT HEART CATH AND CORONARY ANGIOGRAPHY Left 01/30/2018   Procedure: LEFT HEART CATH AND CORONARY ANGIOGRAPHY;  Surgeon: Isaias Cowman, MD;  Location: [facility];  Service: Cardiovascular;  Laterality: Left;   right knee surgery     ? type of procedure    STENT  PLACEMENT VASCULAR ([place] [facility]X)     TONSILLECTOMY     TONSILLECTOMY AND ADENOIDECTOMY      Social History   Socioeconomic History   Marital status: Single    Spouse name: Not on file   Number of children: 0   Years of education: Not on file   Highest education level: Not on file  Occupational History   Not on file  Social Needs   Financial resource strain: Not on file   Food insecurity    Worry: Not on file    Inability: Not on file   Transportation needs    Medical: Not on file    Non-medical: Not on file  Tobacco Use   Smoking status: Former Smoker    Years: 5.00    Types: Cigarettes   Smokeless tobacco: Former Systems developer    Quit date: 12/19/2015   Tobacco comment: 3-4 cig every couple days  Substance and Sexual Activity   Alcohol use: No    Frequency: Never   Drug use: No   Sexual activity: Not on file  Lifestyle   Physical activity    Days per week: Not on file    Minutes per session: Not on file   Stress: Not on file  Relationships   Social connections    Talks on phone: Not on file    Gets together: Not on file    Attends religious service: Not on file    Active member of club or organization: Not on file    Attends meetings of clubs or organizations: Not on file    Relationship status: Not on file   Intimate partner violence    Fear of current or ex partner: Not on file    Emotionally abused: Not on file    Physically abused: Not on file    Forced sexual activity: Not on file  Other Topics Concern   Not on file  Social History Narrative   Former smoker quit 2013/2014 used to smoke<1 ppd x 2-3 years    No kids   Single not sexually active    Unable to read/write    Has dogs and cats at home    No guns   Wears seat belt     Family History  Problem Relation Age of Onset   Cancer Mother    Heart disease Father    Stroke Father    Heart disease Brother      Current Outpatient Medications:    acetaminophen (TYLENOL) 500 MG  tablet, Take 1 tablet (500 mg total) by mouth every 6 (six) hours as needed., Disp: 30 tablet, Rfl: 0   albuterol (VENTOLIN HFA) 108 (90 Base) MCG/ACT inhaler, Inhale 1-2 puffs into the lungs every 6 (six) hours as needed. For shortness of breath, Disp: 18 g, Rfl: 11   allopurinol (ZYLOPRIM) 300 MG tablet,  Take by mouth., Disp: , Rfl:    amLODipine (NORVASC) 5 MG tablet, Take 5 mg by mouth daily., Disp: , Rfl:    atorvastatin (LIPITOR) 20 MG tablet, Take 1 tablet (20 mg total) by mouth daily at 6 PM., Disp: 90 tablet, Rfl: 3   budesonide-formoterol (SYMBICORT) 160-4.5 MCG/ACT inhaler, Inhale 2 puffs into the lungs 2 (two) times daily. Rinse mouth, Disp: 1 Inhaler, Rfl: 12   cetirizine (ZYRTEC) 10 MG tablet, Take 10 mg by mouth daily., Disp: , Rfl:    Cholecalciferol 50000 units capsule, Take 1 capsule (50,000 Units total) by mouth once a week., Disp: 13 capsule, Rfl: 1   clobetasol ointment (TEMOVATE) 1.32 %, Apply 1 application topically 2 (two) times daily. Right ear as needed not face or private, Disp: 45 g, Rfl: 0   clotrimazole (LOTRIMIN) 1 % cream, Apply 1 application topically 2 (two) times daily., Disp: 60 g, Rfl: 11   diclofenac (FLECTOR) 1.3 % PTCH, Place 1 patch onto the skin 2 (two) times daily., Disp: 60 patch, Rfl: 2   dicyclomine (BENTYL) 10 MG capsule, Take 1 capsule (10 mg total) by mouth 4 (four) times daily -  before meals and at bedtime., Disp: 90 capsule, Rfl: 0   Docusate Sodium 100 MG capsule, Take by mouth., Disp: , Rfl:    DULoxetine (CYMBALTA) 60 MG capsule, Take 1 capsule (60 mg total) by mouth daily., Disp: 90 capsule, Rfl: 3   fluticasone (FLONASE) 50 MCG/ACT nasal spray, Place 2 sprays into both nostrils daily., Disp: 16 g, Rfl: 6   furosemide (LASIX) 40 MG tablet, Take 1 tablet (40 mg total) by mouth 2 (two) times daily. In am and at lunch, Disp: 60 tablet, Rfl: 5   glucose blood test strip, 1 each by Other route 3 (three) times daily as needed. DX-  E11.9 Diabetes Mellitus type 2., Disp: 420 each, Rfl: 2   hydrocortisone 2.5 % cream, Apply topically 2 (two) times daily. Left face left of eye, Disp: 60 g, Rfl: 0   hydrOXYzine (ATARAX/VISTARIL) 25 MG tablet, Take 1 tablet (25 mg total) by mouth 3 (three) times daily as needed., Disp: 30 tablet, Rfl: 0   isosorbide mononitrate (IMDUR) 30 MG 24 hr tablet, Take 1 tablet (30 mg total) by mouth daily., Disp: 90 tablet, Rfl: 3   ketorolac (ACULAR) 0.5 % ophthalmic solution, , Disp: , Rfl:    levofloxacin (LEVAQUIN) 500 MG tablet, Take by mouth., Disp: , Rfl:    metFORMIN (GLUCOPHAGE) 500 MG tablet, Take 1 tablet (500 mg total) by mouth daily with breakfast., Disp: 90 tablet, Rfl: 3   metoprolol tartrate (LOPRESSOR) 25 MG tablet, Take 25 mg by mouth 2 (two) times daily., Disp: , Rfl:    montelukast (SINGULAIR) 10 MG tablet, Take 10 mg by mouth at bedtime., Disp: , Rfl:    mupirocin ointment (BACTROBAN) 2 %, Apply 1 application topically 2 (two) times daily. Any open sores, Disp: 60 g, Rfl: 2   nitroGLYCERIN (NITROSTAT) 0.4 MG SL tablet, Place 0.4 mg under the tongue every 5 (five) minutes as needed. For chest pain, Disp: , Rfl:    polyethylene glycol (MIRALAX / GLYCOLAX) 17 g packet, Take by mouth., Disp: , Rfl:    posaconazole (NOXAFIL) 100 MG TBEC delayed-release tablet, Take by mouth., Disp: , Rfl:    potassium chloride SA (K-DUR,KLOR-CON) 20 MEQ tablet, Take 1 tablet (20 mEq total) by mouth daily., Disp: 90 tablet, Rfl: 1   prednisoLONE acetate (PRED FORTE) 1 %  ophthalmic suspension, , Disp: , Rfl:    Secukinumab (COSENTYX 300 DOSE [place]), Inject into the skin every 30 (thirty) days., Disp: , Rfl:    senna (SENNA-LAX) 8.6 MG tablet, Take by mouth., Disp: , Rfl:    spironolactone (ALDACTONE) 25 MG tablet, Take 1 tablet (25 mg total) by mouth daily. In am for blood pressure, Disp: 90 tablet, Rfl: 3   sucralfate (CARAFATE) 1 g tablet, Take 1 tablet (1 g total) by mouth 4 (four) times  daily -  with meals and at bedtime., Disp: 120 tablet, Rfl: 0   traMADol (ULTRAM) 50 MG tablet, Take by mouth., Disp: , Rfl:    triamcinolone cream (KENALOG) 0.1 %, Apply 1 application topically 2 (two) times daily. Not face, groin, underarms, Disp: 454 g, Rfl: 11   valACYclovir (VALTREX) 500 MG tablet, Take by mouth., Disp: , Rfl:    venetoclax 100 MG TABS, Take by mouth., Disp: , Rfl:    omeprazole (PRILOSEC) 40 MG capsule, Take 1 capsule (40 mg total) by mouth daily. (Patient not taking: Reported on 01/08/2019), Disp: 90 capsule, Rfl: 3   ondansetron (ZOFRAN) 4 MG tablet, Take 1 tablet (4 mg total) by mouth every 8 (eight) hours as needed for nausea or vomiting. (Patient not taking: Reported on 01/08/2019), Disp: 20 tablet, Rfl: 0   oxyCODONE-acetaminophen (PERCOCET) 5-325 MG tablet, Take 1 tablet by mouth 2 (two) times daily as needed for severe pain. (Patient not taking: Reported on 12/19/2018), Disp: 10 tablet, Rfl: 0   pantoprazole (PROTONIX) 20 MG tablet, TAKE 1 TABLET EVERY DAY (Patient not taking: Reported on 01/08/2019), Disp: 90 tablet, Rfl: 0  Physical exam:  Vitals:   02/03/19 1000  BP: 124/68  Pulse: 82  Resp: 20  Temp: (!) 97.1 F (36.2 C)  TempSrc: Tympanic  SpO2: 98%  Weight: 227 lb 4.8 oz (103.1 kg)  PF: (!) 2 L/min   Physical Exam   CMP Latest Ref Rng & Units 12/31/2018  Glucose 70 - 99 mg/dL 160(H)  BUN 8 - 23 mg/dL 12  Creatinine 0.61 - 1.24 mg/dL 0.76  Sodium 135 - 145 mmol/L 140  Potassium 3.5 - 5.1 mmol/L 3.8  Chloride 98 - 111 mmol/L 100  CO2 22 - 32 mmol/L 31  Calcium 8.9 - 10.3 mg/dL 8.6(L)  Total Protein 6.5 - 8.1 g/dL 6.2(L)  Total Bilirubin 0.3 - 1.2 mg/dL 0.7  Alkaline Phos 38 - 126 U/L 52  AST 15 - 41 U/L 19  ALT 0 - 44 U/L 17   CBC Latest Ref Rng & Units 02/03/2019  WBC 4.0 - 10.5 K/uL 2.9(L)  Hemoglobin 13.0 - 17.0 g/dL 7.1(L)  Hematocrit 39.0 - 52.0 % 20.8(L)  Platelets 150 - 400 K/uL PENDING    No images are attached to the  encounter.  Dg Knee 1-2 Views Right  Result Date: 01/27/2019 CLINICAL DATA:  Right knee pain for the past 1-2 years. History of arthritis. No known injury EXAM: RIGHT KNEE - 1-2 VIEW COMPARISON:  None. FINDINGS: No fracture or dislocation. 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 Several small loose bodies are seen about the posterior aspect of the knee joint space though discrete donor sites are not identified. No joint effusion or evidence of lipohemarthrosis. No evidence of chondrocalcinosis. Regional soft tissues appear normal. 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 Electronically Signed   By:  Sandi Mariscal M.D.   On: 01/27/2019 18:11   Ct Bone Marrow Biopsy & Aspiration  Result Date: 01/15/2019 INDICATION: 63 year old male with thrombocytopenia EXAM: CT BONE MARROW BIOPSY AND ASPIRATION MEDICATIONS: None. ANESTHESIA/SEDATION: Moderate (conscious) sedation was employed during this procedure. A total of Versed 1.0 mg and Fentanyl 75 mcg was administered intravenously. Moderate Sedation Time: 11 minutes. The patient's level of consciousness and vital signs were monitored continuously by radiology nursing throughout the procedure under my direct supervision. FLUOROSCOPY TIME:  CT COMPLICATIONS: None PROCEDURE: The procedure risks, benefits, and alternatives were explained to the patient. Questions regarding the procedure were encouraged and answered. The patient understands and consents to the procedure. Scout CT of the pelvis was performed for surgical planning purposes. The right posterior pelvis was prepped with Chlorhexidine in a sterile fashion, and a sterile drape was applied covering the operative field. A sterile gown and sterile gloves were used for the procedure. Local anesthesia was  provided with 1% Lidocaine. Posterior right iliac bone was targeted for biopsy. The skin and subcutaneous tissues were infiltrated with 1% lidocaine without epinephrine. A small stab incision was made with an 11 blade scalpel, and an 11 gauge Murphy needle was advanced with CT guidance to the posterior cortex. Manual forced was used to advance the needle through the posterior cortex and the stylet was removed. A bone marrow aspirate was retrieved and passed to a cytotechnologist in the room. The Murphy needle was then advanced without the stylet for a core biopsy. The core biopsy was retrieved and also passed to a cytotechnologist. Manual pressure was used for hemostasis and a sterile dressing was placed. No complications were encountered no significant blood loss was encountered. Patient tolerated the procedure well. Normal hemodynamics, with 1 episode of hypoxia registering 80-85% which resolved immediately with stimulation from the procedure. IMPRESSION: Status post CT-guided bone marrow biopsy, with tissue specimen sent to pathology for complete histopathologic analysis Signed, Dulcy Fanny. Earleen Newport, DO [facility] Radiology Electronically Signed   By: Corrie Mckusick D.O.   On: 01/15/2019 09:56    Assessment and plan- Patient is a 63 y.o. male ***   Visit Diagnosis No diagnosis found.  Patient expressed understanding and was in agreement with this plan. He also understands that He can call clinic at any time with any questions, concerns, or complaints.   Thank you for allowing me to participate in the care of this very pleasant patient.   Beckey Rutter, DNP, AGNP-C [facility] (work cell) [phone] (office)  CC:

## 2019-02-03 NOTE — Unmapped (Signed)
Diet Education  Telephone Outreach Encounter (in lieu of in-person visit during COVID-19 pandemic).    Pt is a 63 y.o. with newly diagnosed AML whose family member Rolly Salter [phone]) requested RD call.  Rolly Salter and other family members have been helping care for pt as he has some cognitive deficits.  Rolly Salter reports pt is not eating much and when ever he bites food he grimaces in pain from his stomach.  Endorses constipation which he was put on a bowel regimen Friday to help alleviate this.  Rolly Salter repots he does better with drinking and he has bene drinking Whole Milk, Soda, and she recently bought him Slim Fast shakes (180 kcals).  She is also worried that he doesn't have enough healthy food in his diet because his hemoglobin was low and pt had zero energy.      Explained to [place] that pt needs calories for energy and discussed high calorie snacks.  She thinks he'd be willing to eating nuts pr peanut butter.  Since he is doing well with drinking, encouraged her to switch to Ensure Plus or Boost Plus or Naked Smoothies (370 kcals).  She could order Boost [facility] also.  Encouraged homemade shakes and smoothies but she reports pt would not have the capacity to do this.  While they are trying to move him closer to family, she was in need of help with transportation and weekly aide- this request was forwarded to social work.    Emailed her a Glass blower/designer Protein foods/snacks at [email or web address]    Intervention:  1.  Nutrition education provided on increasing calories and protein.  Pt's sister's questions and comments indicated good understanding.   =======================================================  Reminded pt to call if any questions arise.  Will follow up as needed.    Neila Gear MS, RD, CSO, LDN

## 2019-02-03 NOTE — Unmapped (Signed)
[name]  Oncology Outpatient Social Work        Referring Provider:   [name], RN Navigator and Raymond [name], Dietician    Reason for Referral:  Transportation and other psychosocial concerns    Oncology Treatment Status:  Pt is a 2 yom with newly diagnosed AML.     Pt will receive chemo infusion 02/10/19, and is being referred to [facility] in [place], [place] as he plans to relocate this weekend to live with his sister and brother-in-law permanently so that they can care for him.    Psychosocial Status and Case Management:  Raymond [name] ([phone]), Pt's niece in [place]. Pt's niece in the area tries to coordinate his care b/c he has lifelong developmental disabilities and receives SSI income.    Pt is single and currently staying at his home in [place], [place]. He has a driver's license, but is not able to drive currently due to illness.   Niece is seeking transportation assistance to get patient to his medical appointments in [place].  Provided Raymond [name] with [employer], so that they can call and set up rides for him.    Niece is also concerned that patient needs help with ADL's because he lives alone. Specifically, help with bathing, laundry, food preparation, etc.   Discussed the option of sending a referral to [facility]. However, Pt's sister Raymond [name]), in the [place] area, is planning to bring Pt over to live with her and her husband this weekend--permanently--so that they can provide the level of care that he needs.    T/C tp Raymond [name] [phone]), Pt's sister, in the [place], [place] area. Raymond [name] states that her sister in [place] (who is the mother of Raymond [name]) doesn't get it, and that's why Raymond [name] didn't seem aware of the longer term plan for Pt to relocate to the [place] area.  For this reason, a referral to home health in [place] doesn't make sense, and Raymond [name] said that he wouldn't need this over at her home b/c they plan to provide this level of care for him.    Raymond [name] also explained that the plan is for her brother to transfer care to [facility] as soon as possible, with Raymond Cool, RN Navigator's help, although Pt will still receive his infusion scheduled for 02/10/19 here in [place].     This SW'er explained to [place] that we could provide them with gas cards for that treatment day, for which she is very appreciative.    Follow-Up Plan:  Provide Pt and family with [employer] gas cards on 02/10/19 in infusion clinic.  Raymond [name] has this SW'ers contact information, and will reach out for additional assistance if needed.       Raymond [name], OSW-C  Oncology Outpatient Social Worker  Phone: [phone]

## 2019-02-03 NOTE — Unmapped (Signed)
[facility]   Patient Onboarding/Medication Counseling    Raymond [name] is a 63 y.o. 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  I am speaking to the patient's caregiver. Kendal Hymen, CPP    Verified patient's date of birth / HIPAA.    Specialty medication(s) to be sent: Hematology/Oncology: Venclexta      Non-specialty medications/supplies to be sent: n/a      Medications not needed at this time: Posaconazole         Venclexta (Venetoclax)    Medication & Administration     Dosage: AML: Day 1: Take 100 mg by mouth once daily. Day 2: Take 200 mg by mouth once daily. Day 3 and thereafter: Take 400 mg by mouth once daily.    Administration:   ? Take with a meal.  ? Swallow whole with a glass of water. Do not break, crush or chew.    ? Drink plenty of water when taking this drug unless told to drink less water by your doctor. Drink 6 to 8 glasses (about 56 ounces total) of water each day. Start doing this 2 days before your first dose.   ? Do not stop taking unless instructed to do so by your doctor.    ? If you throw up after taking a dose, do not repeat the dose. Take your next dose at your normal time.  Adherence/Missed dose instructions: Take a missed dose as soon as you think about.  If it has been more than 8 hours since the missed dose, skip the missed dose and go back to your normal time. Do not take 2 doses at the same time or extra doses.  Goals of Therapy     ? To prevent disease progression    Side Effects & Monitoring Parameters     Commonly reported side effects  ? Abdominal pain   ? Common cold symptoms  ? Fatigue, loss of strength and energy  ? Constipation  ? Muscle pain, joint pain, back pain  ? Mouth irritation, mouth sores  ? Restlessness, difficulty seeping    The following side effects should be reported to the provider:  ? Signs of infection (fever >100.4, chills, mouth sores, sputum production)  ? Signs of bleeding (vomiting or coughing up blood, blood that looks like coffee grounds, blood in the urine or black, red tarry stools, bruising that gets bigger without reason, any persistent or severe bleeding, impaired wound healing)  ? Signs of low blood sugar (dizziness, headache, fatigue, feeling weak, shaking, fast heartbeat, confusion, increased hunger or sweating)  ? Signs of high blood sugar (confusion, fatigue increased thirst, increased hunger, passing a lot of urine, flushing, fast breathing or breath that smells like fruit)  ? Signs of fluid and electrolyte problems (mood changes, confusion, muscle pain or weakness, abnormal/fast heartbeat, severe dizziness, passing out)  ? Signs of tumor lysis syndrome (fast heartbeat or abnormal heartbeat; any passing out; unable to pass urine; muscle weakness or cramps; nausea, vomiting, diarrhea or lack of appetite; or feeling sluggish)  ? Severe headache, dizziness, passing out  ? Vision changes  ? Severe loss of energy and strength  ? Dark urine, urine discoloration, yellow eyes or skin  ? Severe nausea, vomiting, inability to eat  ? Severe diarrhea   ? Shortness of breath  ? Swelling  ? Bruising  ? Pale skin  ? Signs of anaphylaxis (wheezing, chest tightness, swelling of face, lips, tongue or throat)  Monitoring Parameters:   ? CBC with differential (throughout treatment)  ? Blood chemistries (potassium, uric acid, phosphorus, calcium, and creatinine)  ? Pregnancy test (prior to treatment in females of reproductive potential)  ? Assess tumor burden, including radiographic evaluation (eg, CT scan) for tumor lysis syndrome (TLS) risk evaluation;   ? Monitor for signs/symptoms of infection.   ? Monitor adherence.    Contraindications, Warnings, & Precautions   Contraindications:  ? Concomitant use with strong CYP3A inhibitors at initiation and during ramp-up phase in patients with chronic lymphocytic leukemia/small lymphocytic lymphoma due to the potential for increased risk of tumor lysis syndrome.   Warnings & Precautions:   ? Bone marrow suppression: Neutropenia, thrombocytopenia, and anemia may occur. Neutropenic fever has been reported both with monotherapy and combination therapy.   ? Infection: Serious and fatal infections, including pneumonia and sepsis, have occurred.   ? Tumor lysis syndrome: Tumor lysis syndrome including fatalities and renal failure requiring dialysis has occurred in patients with high tumor burden when treated with venetoclax.   ? Hepatic impairment: Dosage adjustment recommended in patients with severe hepatic impairment. Monitor closely for toxicities.  ? Multiple myeloma: In patients with relapsed or refractory multiple myeloma, an increase in mortality was noted when venetoclax was added to bortezomib and dexamethasone. Venetoclax is not approved for the treatment of multiple myeloma.  ? Renal impairment: Patients with decreased renal function (CrCl <80 mL/minute) are at increased risk for TLS and may require more intensive TLS prophylaxis and monitoring during treatment initiation and dose escalation.  ? Immunizations: Live vaccinations should not be administered prior to, during, or after venetoclax treatment until B-cell recovery occurs. Vaccines may be less effective.  ? Reproductive Considerations: Females of reproductive potential should have a pregnancy test prior to therapy and use effective contraception during treatment and for at least 30 days after the final dose.  Based on the mechanism of action and data from animal reproduction studies, venetoclax is expected to cause fetal harm if administered during pregnancy.  ? Breastfeeding considerations: It is not known if venetoclax is present in breast milk. Due to the potential for serious adverse reactions in the breastfed infant, breastfeeding is not recommended by the manufacturer.  Drug/Food Interactions     ? Medication list reviewed in Epic. The patient was instructed to inform the care team before taking any new medications or supplements. Venclexta may increase blood sugar.  Monitoring is recommended.  Patient has not started posaconazole yet, but dose will be decreased accordingly if and when initaited. .   ? Avoid live vaccines. (venetoclax may diminish the therapeutic effects of live vaccines and enhance the toxic/adverse effects of live vaccines.  ? Avoid grapefruit and grapefruit juice. (may increase serum concentrations of venetoclax)  ? Avoid [place] oranges and star fruit. (May increase serum concentrations of venetoclax)    Storage, Handling Precautions, & Disposal   ? Store at room temperature in the original container (do not use a pillbox or store with other medications).   ? Caregivers helping administer medication should wear gloves and wash hands immediately after.    ? Keep the lid tightly closed. Keep out of the reach of children and pets.  ? Do not flush down a toilet or pour down a drain unless instructed to do so.  Check with your local police department or fire station about drug take-back programs in your area.        Current Medications (including OTC/herbals), Comorbidities and Allergies  Current Outpatient Medications   Medication Sig Dispense Refill   ??? acetaminophen (TYLENOL) 650 MG CR tablet Take 1 tablet (650 mg total) by mouth every eight (8) hours as needed for pain. 30 tablet 0   ??? albuterol (PROVENTIL HFA;VENTOLIN HFA) 90 mcg/actuation inhaler Inhale 2 puffs every four (4) hours as needed.     ??? allopurinoL (ZYLOPRIM) 300 MG tablet Take 1 tablet (300 mg total) by mouth daily. 30 tablet 0   ??? amLODIPine (NORVASC) 5 MG tablet TAKE ONE TABLET BY MOUTH EVERY DAY 30 tablet 2   ??? aspirin (ECOTRIN) 81 MG tablet Take 81 mg by mouth daily.     ??? atorvastatin (LIPITOR) 20 MG tablet Take 20 mg by mouth daily.     ??? azelastine (ASTELIN) 137 mcg (0.1 %) nasal spray 1 spray by Each Nare route Two (2) times a day. Use in each nostril as directed 1.2 mL 0   ??? benazepril (LOTENSIN) 20 MG tablet Take 20 mg by mouth daily.     ??? butalbital-acetaminophen-caff (FIORICET) 50-300-40 mg cap Take 1 capsule by mouth every six (6) hours as needed for headache. 20 capsule 0   ??? cetirizine (ZYRTEC) 10 MG tablet TAKE ONE Tablet BY MOUTH IN THE MORNING  2   ??? citalopram (CELEXA) 20 MG tablet Take 20 mg by mouth daily.     ??? clobetasoL (TEMOVATE) 0.05 % ointment Apply topically once daily, only to thick spots, as needed. 60 g 2   ??? clopidogrel (PLAVIX) 75 mg tablet Take 1 tablet (75 mg total) by mouth daily. 90 tablet 3   ??? diphenhydrAMINE (BENADRYL) 25 mg capsule Take 1 capsule (25 mg total) by mouth every six (6) hours as needed for itching. 20 capsule 0   ??? docusate sodium 100 mg tablet Take 1 tablet (100 mg total) by mouth Two (2) times a day. 60 tablet 3   ??? DULoxetine (CYMBALTA) 60 MG capsule Take 60 mg by mouth daily.     ??? FLECTOR 1.3 % PT12 Place 1 patch on the skin daily.  0   ??? fluticasone (FLONASE) 50 mcg/actuation nasal spray USE TWO SPRAYS EACH nostril ONCE daily  6   ??? furosemide (LASIX) 40 MG tablet TAKE ONE TABLET BY MOUTH EVERY DAY 30 tablet 2   ??? gabapentin (NEURONTIN) 300 MG capsule Take 300 mg by mouth nightly.  5   ??? hydrOXYzine (ATARAX) 50 MG tablet Take 50 mg by mouth nightly as needed.  2   ??? ibuprofen (ADVIL,MOTRIN) 600 MG tablet Take 1 tablet (600 mg total) by mouth every six (6) hours as needed for pain. 30 tablet 0   ??? isosorbide dinitrate (ISORDIL) 20 MG tablet Take 20 mg by mouth Two (2) times a day.     ??? ketorolac (ACULAR) 0.5 % ophthalmic solution USE 1 DROP TO OPERATIVE EYE 4 TIMES A DAY AS DIRECTED STARTING 2 DAYS BEFORE SURGERY  2   ??? levoFLOXacin (LEVAQUIN) 500 MG tablet Take 1 tablet (500 mg total) by mouth daily. 30 tablet 6   ??? meloxicam (MOBIC) 15 MG tablet Take 15 mg by mouth every morning before breakfast.  5   ??? metFORMIN (GLUCOPHAGE) 500 MG tablet      ??? metoprolol succinate (TOPROL-XL) 25 MG 24 hr tablet Take 25 mg by mouth daily.     ??? metoprolol tartrate (LOPRESSOR) 25 MG tablet Take by mouth.     ??? montelukast (SINGULAIR) 10 mg tablet Take 10 mg by mouth nightly.  11   ??? moxifloxacin (VIGAMOX) 0.5 % ophthalmic solution INSTILL 1 DROP 4 TIMES A DAY STARTING 2 DAYS BEFORE SURGERY CONTINUE UNTIL TOLD OTHERWISE BY MD  2   ??? mupirocin (BACTROBAN) 2 % ointment Apply topically.     ??? nitroglycerin (NITROSTAT) 0.4 MG SL tablet Place 1 tablet (0.4 mg total) under the tongue every five (5) minutes as needed for chest pain. 30 tablet 0   ??? ondansetron (ZOFRAN) 4 MG tablet Take 4 mg by mouth.     ??? pantoprazole (PROTONIX) 40 MG tablet Take 1 tablet (40 mg total) by mouth daily. 90 tablet 3   ??? polyethylene glycol (MIRALAX) 17 gram packet Take 17 g by mouth daily. 30 packet 3   ??? posaconazole (NOXAFIL) 100 mg TbEC delayed released tablet Take 3 tablets (300 mg) by mouth daily. 90 tablet 5   ??? prednisoLONE acetate (PRED FORTE) 1 % ophthalmic suspension INSTILL 1 DROP EVERY WAKING HOUR FIRST 2 DAYS ONLY THEN 4 TIMES DAILY 3 TO 7 DAYS AFTER  0   ??? senna (SENNA LAX) 8.6 mg tablet Take 2 tablets by mouth nightly as needed (for no bowel movement during the day). 60 tablet 3   ??? spironolactone (ALDACTONE) 25 MG tablet Take 1 tablet (25 mg total) by mouth daily. 90 tablet 3   ??? sulfamethoxazole-trimethoprim (BACTRIM DS) 800-160 mg per tablet sulfamethoxazole 800 mg-trimethoprim 160 mg tablet   TAKE 1 TABLET BY MOUTH TWICE A DAY     ??? SYMBICORT 160-4.5 mcg/actuation inhaler INHALE TWO PUFFS BY MOUTH TWICE DAILY  11   ??? traMADoL (ULTRAM) 50 mg tablet Take 1 tablet (50 mg total) by mouth every eight (8) hours as needed for pain. 90 tablet 0   ??? triamcinolone (KENALOG) 0.1 % cream APPLY TO AFFECTED AREA(S) TWICE DAILY  11   ??? valACYclovir (VALTREX) 500 MG tablet Take 1 tablet (500 mg total) by mouth daily. 30 tablet 0   ??? venetoclax (VENCLEXTA) 100 mg tablet Take 4 tablets (400 mg total) by mouth daily. Take with a meal and water. Do not chew, crush, or break tablets. 120 tablet 2     No current facility-administered medications for this visit.        No Known Allergies    Patient Active Problem List   Diagnosis   ??? CAD (coronary artery disease)   ??? Coronary atherosclerosis   ??? Edema   ??? Hyperlipidemia   ??? Hypertension   ??? Angina effort   ??? Allergic rhinitis   ??? Chest pain   ??? Chronic airway obstruction, not elsewhere classified   ??? Depressive disorder, not elsewhere classified   ??? Dyspnea   ??? Family history of ischemic heart disease (IHD)   ??? Infective otitis externa   ??? Arthritis   ??? Asthma   ??? Diabetes mellitus (CMS-HCC)   ??? Diarrhea   ??? Headaches due to old head injury   ??? Benign neoplasm of colon   ??? Other psoriasis   ??? Otogenic otalgia   ??? Abdominal pain   ??? Type II or unspecified type diabetes mellitus without mention of complication, not stated as uncontrolled   ??? Coronary artery disease involving native coronary artery of native heart without angina pectoris   ??? Mixed hyperlipidemia   ??? Essential hypertension   ??? Thrombocytopenia (CMS-HCC)   ??? 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 Anemia       Reviewed and up to date in Epic.  Appropriateness of Therapy     Is medication and dose appropriate based on diagnosis? Yes    Baseline Quality of Life Assessment      How many days over the past month did your condition keep you from your normal activities? 0    Financial Information     Medication Assistance provided: None Required    Anticipated copay of $3.90 reviewed with patient. Verified delivery address.    Delivery Information     Scheduled delivery date: 02/06/19    Expected start date: 02/10/19    Medication will be delivered via [facility] to the temporary address in [place].  This shipment will not require a signature.      Explained the services we provide at [facility] and that each month we would call to set up refills.  Stressed importance of returning phone calls so that we could ensure they receive their medications in time each month. Informed patient that we should be setting up refills 7-10 days prior to when they will run out of medication.  A pharmacist will reach out to perform a clinical assessment periodically.  Informed patient that a welcome packet and a drug information handout will be sent.      Patient verbalized understanding of the above information as well as how to contact the pharmacy at [phone] option 4 with any questions/concerns.  The pharmacy is open Monday through Friday 8:30am-4:30pm.  A pharmacist is available 24/7 via pager to answer any clinical questions they may have.    Patient Specific Needs     ? Does the patient have any physical, cognitive, or cultural barriers? No    ? Patient prefers to have medications discussed with  Patient     ? Is the patient able to read and understand education materials at a high school level or above? Yes    ? Patient's primary language is  English     ? Is the patient high risk? No     ? Does the patient require a Care Management Plan? No     ? Does the patient require physician intervention or other additional services (i.e. nutrition, smoking cessation, social work)? No      Kassidy Dockendorf  Anders Grant  [facility] Specialty Pharmacist

## 2019-02-04 ENCOUNTER — Telehealth: Payer: Self-pay | Admitting: Internal Medicine

## 2019-02-04 ENCOUNTER — Institutional Professional Consult (permissible substitution): Admit: 2019-02-04 | Discharge: 2019-02-05 | Payer: MEDICARE | Attending: Pharmacist | Primary: Pharmacist

## 2019-02-04 LAB — BPAM PLATELET PHERESIS
Blood Product Expiration Date: 202009302359
ISSUE DATE / TIME: 202009281607
Unit Type and Rh: 5100

## 2019-02-04 LAB — BPAM RBC
Blood Product Expiration Date: 202010202359
ISSUE DATE / TIME: 202009281412
Unit Type and Rh: 6200

## 2019-02-04 LAB — PREPARE PLATELET PHERESIS: Unit division: 0

## 2019-02-04 LAB — TYPE AND SCREEN
ABO/RH(D): A POS
Antibody Screen: NEGATIVE
Unit division: 0

## 2019-02-04 NOTE — Telephone Encounter (Signed)
Patient states oncologist prescribed a new medication (new medication name is unknown by the caller) and was advised to d/c  hydrOXYzine (ATARAX/VISTARIL) 25 MG tablet and flexeril (chart doesn't reflect) . The sister was uncertain of medication and would like to speak with nurse to  discuss further

## 2019-02-04 NOTE — Unmapped (Addendum)
[name]   Patient Onboarding/Medication Counseling    Mr.Raymond [name] is a 63 y.o. 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  I am speaking to the patient's caregiver. Kendal Hymen, CPP    Verified patient's date of birth / HIPAA.    Specialty medication(s) to be sent: Hematology/Oncology: Noxafil      Non-specialty medications/supplies to be sent: n/a      Medications not needed at this time: Venclexta           Noxafil (posaconazole)    Medication & Administration     Dosage:   ? Take 3 tablets (300 mg) by mouth daily.    Administration:   ? Take with food  ? Swallow the pills whole, do not break, crush, or chew    Adherence/Missed dose instructions:  ? Take a missed dose as soon as you think about it with food  ? If it is close to your next dose, skip the missed dose and go back to your normal time.  ? Do not take 2 doses at the same time or extra doses.  ? Report any missed doses to coordinator    Goals of Therapy     ? To prevent or treat fungal infections    Side Effects and Monitoring Parameters     ? Common side effects  ? Headache  ? Feeling tired or weak  ? Stomach pain, upset stomach, vomiting  ? Diarrhea or constipation  ? Cough  ? Loss of appetite    ? The following side effects should be reported to the provider:  ? Allergic reaction (rash, hives, swelling, blistering or peeling of skin, shortness of breath)  ? Infection (fever, chills, sore throat, ear/sinus pain, cough, sputum change, urinary pain, mouth sores, non-healing wounds)  ? Any unexplained bleeding, abnormal vaginal bleeding, nosebleed  ? Electrolyte problems (mood changes, confusion, weakness, abnormal heartbeat, seizures)  ? Swelling in arms or legs  ? Very tired or weak  ? Liver issues (yellowing of skin or eyes, dark urine, light stools, severe stomach pain)  ? Fast or abnormal heartbeat or passing out    ? Monitoring parameters  ? Renal and hepatic function  ? CBC  ? Adequate hydration ? For invasive aspergillosis    Contraindications, Warnings, & Precautions     ? Do not take if you are allergic to ingredients in this medication  ? Be cautious with electrolyte imbalances, cardiac histories, and hepatic or renal dysfunction- speak with provider  ? Speak with provider immediately if you are pregnant, plan to become pregnant, or are breastfeeding      Drug/Food Interactions     ? Medication list reviewed in Epic. The patient was instructed to inform the care team before taking any new medications or supplements. Posaconazole can increase serum concentrations of venclexta.   Venclexta dose will be reduced to 100mg  on 10/12 will patient starts posaconazole therapy. Atorvastatin will be discontinued..     Storage, Handling Precautions, & Disposal     ? Store at room temperature  ? Keep away from children and pets        Current Medications (including OTC/herbals), Comorbidities and Allergies     Current Outpatient Medications   Medication Sig Dispense Refill   ??? acetaminophen (TYLENOL) 650 MG CR tablet Take 1 tablet (650 mg total) by mouth every eight (8) hours as needed for pain. 30 tablet 0   ??? albuterol (PROVENTIL HFA;VENTOLIN HFA)  90 mcg/actuation inhaler Inhale 2 puffs every four (4) hours as needed.     ??? allopurinoL (ZYLOPRIM) 300 MG tablet Take 1 tablet (300 mg total) by mouth daily. 30 tablet 0   ??? amLODIPine (NORVASC) 5 MG tablet TAKE ONE TABLET BY MOUTH EVERY DAY 30 tablet 2   ??? aspirin (ECOTRIN) 81 MG tablet Take 81 mg by mouth daily.     ??? atorvastatin (LIPITOR) 20 MG tablet Take 20 mg by mouth daily.     ??? azelastine (ASTELIN) 137 mcg (0.1 %) nasal spray 1 spray by Each Nare route Two (2) times a day. Use in each nostril as directed 1.2 mL 0   ??? benazepril (LOTENSIN) 20 MG tablet Take 20 mg by mouth daily.     ??? butalbital-acetaminophen-caff (FIORICET) 50-300-40 mg cap Take 1 capsule by mouth every six (6) hours as needed for headache. 20 capsule 0   ??? cetirizine (ZYRTEC) 10 MG tablet TAKE ONE Tablet BY MOUTH IN THE MORNING  2   ??? citalopram (CELEXA) 20 MG tablet Take 20 mg by mouth daily.     ??? clobetasoL (TEMOVATE) 0.05 % ointment Apply topically once daily, only to thick spots, as needed. 60 g 2   ??? clopidogrel (PLAVIX) 75 mg tablet Take 1 tablet (75 mg total) by mouth daily. 90 tablet 3   ??? diphenhydrAMINE (BENADRYL) 25 mg capsule Take 1 capsule (25 mg total) by mouth every six (6) hours as needed for itching. 20 capsule 0   ??? docusate sodium 100 mg tablet Take 1 tablet (100 mg total) by mouth Two (2) times a day. 60 tablet 3   ??? DULoxetine (CYMBALTA) 60 MG capsule Take 60 mg by mouth daily.     ??? FLECTOR 1.3 % PT12 Place 1 patch on the skin daily.  0   ??? fluticasone (FLONASE) 50 mcg/actuation nasal spray USE TWO SPRAYS EACH nostril ONCE daily  6   ??? furosemide (LASIX) 40 MG tablet TAKE ONE TABLET BY MOUTH EVERY DAY 30 tablet 2   ??? gabapentin (NEURONTIN) 300 MG capsule Take 300 mg by mouth nightly.  5   ??? hydrOXYzine (ATARAX) 50 MG tablet Take 50 mg by mouth nightly as needed.  2   ??? ibuprofen (ADVIL,MOTRIN) 600 MG tablet Take 1 tablet (600 mg total) by mouth every six (6) hours as needed for pain. 30 tablet 0   ??? isosorbide dinitrate (ISORDIL) 20 MG tablet Take 20 mg by mouth Two (2) times a day.     ??? ketorolac (ACULAR) 0.5 % ophthalmic solution USE 1 DROP TO OPERATIVE EYE 4 TIMES A DAY AS DIRECTED STARTING 2 DAYS BEFORE SURGERY  2   ??? levoFLOXacin (LEVAQUIN) 500 MG tablet Take 1 tablet (500 mg total) by mouth daily. 30 tablet 6   ??? meloxicam (MOBIC) 15 MG tablet Take 15 mg by mouth every morning before breakfast.  5   ??? metFORMIN (GLUCOPHAGE) 500 MG tablet      ??? metoprolol succinate (TOPROL-XL) 25 MG 24 hr tablet Take 25 mg by mouth daily.     ??? metoprolol tartrate (LOPRESSOR) 25 MG tablet Take by mouth.     ??? montelukast (SINGULAIR) 10 mg tablet Take 10 mg by mouth nightly.  11   ??? moxifloxacin (VIGAMOX) 0.5 % ophthalmic solution INSTILL 1 DROP 4 TIMES A DAY STARTING 2 DAYS BEFORE SURGERY CONTINUE UNTIL TOLD OTHERWISE BY MD  2   ??? mupirocin (BACTROBAN) 2 % ointment Apply topically.     ???  nitroglycerin (NITROSTAT) 0.4 MG SL tablet Place 1 tablet (0.4 mg total) under the tongue every five (5) minutes as needed for chest pain. 30 tablet 0   ??? ondansetron (ZOFRAN) 4 MG tablet Take 4 mg by mouth.     ??? pantoprazole (PROTONIX) 40 MG tablet Take 1 tablet (40 mg total) by mouth daily. 90 tablet 3   ??? polyethylene glycol (MIRALAX) 17 gram packet Take 17 g by mouth daily. 30 packet 3   ??? posaconazole (NOXAFIL) 100 mg TbEC delayed released tablet Take 3 tablets (300 mg) by mouth daily. 90 tablet 5   ??? prednisoLONE acetate (PRED FORTE) 1 % ophthalmic suspension INSTILL 1 DROP EVERY WAKING HOUR FIRST 2 DAYS ONLY THEN 4 TIMES DAILY 3 TO 7 DAYS AFTER  0   ??? senna (SENNA LAX) 8.6 mg tablet Take 2 tablets by mouth nightly as needed (for no bowel movement during the day). 60 tablet 3   ??? spironolactone (ALDACTONE) 25 MG tablet Take 1 tablet (25 mg total) by mouth daily. 90 tablet 3   ??? sulfamethoxazole-trimethoprim (BACTRIM DS) 800-160 mg per tablet sulfamethoxazole 800 mg-trimethoprim 160 mg tablet   TAKE 1 TABLET BY MOUTH TWICE A DAY     ??? SYMBICORT 160-4.5 mcg/actuation inhaler INHALE TWO PUFFS BY MOUTH TWICE DAILY  11   ??? traMADoL (ULTRAM) 50 mg tablet Take 1 tablet (50 mg total) by mouth every eight (8) hours as needed for pain. 90 tablet 0   ??? triamcinolone (KENALOG) 0.1 % cream APPLY TO AFFECTED AREA(S) TWICE DAILY  11   ??? valACYclovir (VALTREX) 500 MG tablet Take 1 tablet (500 mg total) by mouth daily. 30 tablet 0   ??? venetoclax (VENCLEXTA) 100 mg tablet Take 4 tablets (400 mg total) by mouth daily. Take with a meal and water. Do not chew, crush, or break tablets. 120 tablet 2     No current facility-administered medications for this visit.        No Known Allergies    Patient Active Problem List   Diagnosis   ??? CAD (coronary artery disease)   ??? Coronary atherosclerosis   ??? Edema   ??? Hyperlipidemia   ??? Hypertension   ??? Angina effort   ??? Allergic rhinitis   ??? Chest pain   ??? Chronic airway obstruction, not elsewhere classified   ??? Depressive disorder, not elsewhere classified   ??? Dyspnea   ??? Family history of ischemic heart disease (IHD)   ??? Infective otitis externa   ??? Arthritis   ??? Asthma   ??? Diabetes mellitus (CMS-HCC)   ??? Diarrhea   ??? Headaches due to old head injury   ??? Benign neoplasm of colon   ??? Other psoriasis   ??? Otogenic otalgia   ??? Abdominal pain   ??? Type II or unspecified type diabetes mellitus without mention of complication, not stated as uncontrolled   ??? Coronary artery disease involving native coronary artery of native heart without angina pectoris   ??? Mixed hyperlipidemia   ??? Essential hypertension   ??? Thrombocytopenia (CMS-HCC)   ??? 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 Anemia       Reviewed and up to date in Epic.    Appropriateness of Therapy     Is medication and dose appropriate based on diagnosis? Yes    Baseline Quality of Life Assessment      How many days over the past month did your condition keep you from your  normal activities? 0    Financial Information     Medication Assistance provided: Prior Authorization    Anticipated copay of $1.30 reviewed with patient. Verified delivery address.    Delivery Information     Scheduled delivery date: 02/13/19    Expected start date: 02/17/19    Medication will be delivered via [facility] to the temporary address in [place].  This shipment will not require a signature.      Explained the services we provide at [facility] and that each month we would call to set up refills.  Stressed importance of returning phone calls so that we could ensure they receive their medications in time each month.  Informed patient that we should be setting up refills 7-10 days prior to when they will run out of medication.  A pharmacist will reach out to perform a clinical assessment periodically. Informed patient that a welcome packet and a drug information handout will be sent.      Patient verbalized understanding of the above information as well as how to contact the pharmacy at [phone] option 4 with any questions/concerns.  The pharmacy is open Monday through Friday 8:30am-4:30pm.  A pharmacist is available 24/7 via pager to answer any clinical questions they may have.    Patient Specific Needs     ? Does the patient have any physical, cognitive, or cultural barriers? No    ? Patient prefers to have medications discussed with  Patient     ? Is the patient able to read and understand education materials at a high school level or above? Yes    ? Patient's primary language is  English     ? Is the patient high risk? No     ? Does the patient require a Care Management Plan? No     ? Does the patient require physician intervention or other additional services (i.e. nutrition, smoking cessation, social work)? No      Balen Woolum  Anders Grant  [facility] Specialty Pharmacist

## 2019-02-04 NOTE — Unmapped (Addendum)
Hello,    Please scheduled patient Raymond [name] on 02/11/2019 for a SCHED ADM THROUGH INF at  12:00 AM     ?? Reason for Admission: Scheduled chemotherapy   ?? Primary Diagnosis:  Encounter for antineoplastic chemotherapy for AML  ?? Primary Diagnosis ICD-10 Code:  Z51.11 & C92.00  ?? Expected length of stay: 4 Days   ?? CPT Code:  16109   ?? CPT Code Description: Under Injection and Intravenous Infusion Chemotherapy and Other Highly Complex Drug or Highly Complex Biologic Agent Administration   ?? Treating Attending: Leotis Pain  ?? Need for PICC placement in Infusion? Yes/No    Infusion Scheduling: Please notify the patient of the appointment date and time once scheduled. Please document this information within this encounter and then route to the navigator prior to closing the encounter.       Scheduled Admissions Assessment        Assessment  Response  Intervention    Type of insurance  Medicare Refer patient to financial counseling to discuss financial assistance options.     Reliable Trasportation  Yes Refer patient back to [employer].   Central Line Access  No Central line access not required     Other Pertinent Information:  Venetoclax ramp up    Thank you,  George Haggart

## 2019-02-04 NOTE — Unmapped (Addendum)
Venetoclax will be sent to me here at the hospital and provided to you on Day 1 (October 5th)    Posaconazole will be sent to me here at the hospital and will be provided to you on Day 8 (October 12th)        October 2020      Sunday Monday Tuesday Wednesday Thursday Friday Saturday   4     5     6     7     8     9     10        Cycle 1 - Azacitidine & Venetoclax Ramp-up Week 1, Day 1    Venetoclax 100 mg (1 tablet) Cycle 1 - Azacitidine & Venetoclax Ramp-up Week 1, Day 2    Venetoclax 200 mg (2 tablets) Cycle 1 - Azacitidine & Venetoclax Ramp-up Week 1, Day 3    Venetoclax 400 mg (4 tablets) Cycle 1 - Azacitidine & Venetoclax Ramp-up Week 1, Day 4    Venetoclax 400 mg (4 tablets) Cycle 1 - Azacitidine & Venetoclax Ramp-up Week 1, Day 5    Venetoclax 400 mg (4 tablets) Cycle 1 - Azacitidine & Venetoclax Ramp-up Week 1, Day 6    Venetoclax 400 mg (4 tablets)   11     12     13     14     15     16     17       Cycle 1 - Azacitidine & Venetoclax Ramp-up Week 1, Day 7    Venetoclax 400 mg (4 tablets)   START Posaconazole 300 mg (3 tablets)    DOSE REDUCE venetoclax to 100 mg (1 tablet)   Posaconazole 300 mg (3 tablets)    Venetoclax 100 mg (1 tablet)   Posaconazole 300 mg (3 tablets)    Venetoclax 100 mg (1 tablet)   Posaconazole 300 mg (3 tablets)    Venetoclax 100 mg (1 tablet)   Posaconazole 300 mg (3 tablets)    Venetoclax 100 mg (1 tablet)   Posaconazole 300 mg (3 tablets)    Venetoclax 100 mg (1 tablet)   18    Posaconazole 300 mg (3 tablets)    Venetoclax 100 mg (1 tablet)   19    Posaconazole 300 mg (3 tablets)    Venetoclax 100 mg (1 tablet) 20    Posaconazole 300 mg (3 tablets)    Venetoclax 100 mg (1 tablet)   21    Posaconazole 300 mg (3 tablets)    Venetoclax 100 mg (1 tablet)   22    Posaconazole 300 mg (3 tablets)    Venetoclax 100 mg (1 tablet) 23    Posaconazole 300 mg (3 tablets)    Venetoclax 100 mg (1 tablet) 24    Posaconazole 300 mg (3 tablets)    Venetoclax 100 mg (1 tablet)            25  Posaconazole 300 mg (3 tablets)    Venetoclax 100 mg (1 tablet)   26    Posaconazole 300 mg (3 tablets)    Venetoclax 100 mg (1 tablet) 27    Posaconazole 300 mg (3 tablets)    Venetoclax 100 mg (1 tablet) 28    Posaconazole 300 mg (3 tablets)    Venetoclax 100 mg (1 tablet) 29    Posaconazole 300 mg (3 tablets)    Venetoclax 100 mg (1 tablet) 30    Posaconazole 300 mg (3  tablets)    Venetoclax 100 mg (1 tablet) 31    Posaconazole 300 mg (3 tablets)    Venetoclax 100 mg (1 tablet)                   You will be receiving 2 types of chemotherapy as part of this regimen. One type is an injection, called azacitidine. You will receive this as a shot for 7 straight days in our infusion center. They will give you some anti-nausea medication each day prior to your injection, called Zofran. This can sometimes cause constipation, so feel free to add Miralax to your regimen if needed.    The other chemotherapy called venetoclax is in a tablet form that you will take at home. I pasted a handout below. You will take this everyday at home WITH FOOD. In order to prevent something called tumor lysis syndrome, we will do this in a ramp-up as follows:  Day 1 = 1 tablet  Day 2 = 2 tablets  Day 3 - 7 = 4 tablets  Day 8 = 1 tablet (START posaconazole 3 tablets daily)    On Day 8 we are starting posaconazole as a preventative antifungal. This INTERACTS with your venetoclax, so we will ask you to dose reduce the venetoclax to just 1 tablet daily when the antifungal medication is started. I will provide you this medication when I see you on Day 8.    Continue other infection prevention medications including levofloxacin and valtrex once daily.     Continue allopurinol daily and try to stay hydrated (aim for drinking 6-8 glasses of water per day). We will also give you some IV fluids in the infusion center several days of the first week of chemo. Allopurinol and hydrating helps to prevent tumor lysis syndrome.     You will have visits with our team members throughout your first cycle to check in. You will also need labs at least twice weekly after the first week so we can monitor for transfusion needs. Keep an eye on MyChart and your visit summaries regarding appointments.       Venetoclax (Venclexta??) Chemotherapy    This chemotherapy regimen is used to treat acute myeloid leukemia. Venetoclax is an oral tablet that will be taken by mouth every day. You will start this medication following review of labs in clinic on Day 1. You will start on the same day you begin azacitidine injections. Your cancer care team will let you know when to change the dose of the medication.     Drug Name How is it given? On what day?   Venetoclax (Venclexta??)   100 mg (1 x 100 mg tablet) by mouth daily with a meal   Day 1      200 mg (2 x 100 mg tablet) by mouth daily with a meal   Day 2      400 mg (4 x 100 mg tablets) by mouth daily with a meal   Day 3 and beyond        HOW THE TREATMENT IS GIVEN  Venetoclax will be started in the outpatient clinic of the [facility]. It is important to continue taking the medication every day with a meal and a full glass of water, drink at least 6 glasses of water per day for the first 5 weeks, and let your cancer care team know immediately if you experience anything out of the ordinary.     While in the infusion clinic  your cancer care team will provide you with fluids to make sure you stay hydrated. Before you start chemotherapy, you will also be given a medication to prevent a side effect of the chemotherapy called tumor lysis syndrome (TLS). TLS is when the cancer cells die and the contents of the cancer cells spill out into the blood stream, which can make you sick. The medication to prevent TLS is called allopurinol (Zyloprim??). You should take a daily dose of allopurinol by mouth every day, starting at least 3 days before you start venetoclax.     Chemotherapy can interact with other drugs, vitamins, or herbal products.  Let your physician or pharmacist know your current medication list and any new medications you may start during therapy, so that they may check for potential drug interactions.      SIDE EFFECTS             Not everyone who gets the same chemotherapy responds the same way.  Some people have very few side effects while others may experience more.  If you do experience side effects, it important to tell your health care provider.  The most common side effects are listed below.  Side effects you may experience during or immediately after starting Venetoclax:     Nausea, upset stomach, and vomiting: You may be given medications to take at home to prevent and treat nausea.  The medications may include one or more of the following:  ? Prochloroperazine (Compazine??)  ? Ondansetron (Zofran??)    Tumor lysis syndrome (TLS): When your cancer cells die, the contents of your cancer cells spill into your blood stream. This can cause the following blood chemistry changes which can make you sick:    ? Increase in potassium levels (hyperkalemia): You may experience weakness, fatigue, or abnormal heart rhythms. Let your cancer care team know immediately if you experience these symptoms.     ? Increase in phosphate levels (hyperphosphatemia): You may experience spasms, cramping, or numbness. Let your cancer care team know immediately if you experience these symptoms.     ? Increase in uric acid levels (hyperuricemia): You may have problems with urination which could include painful urination or developing blood in the urine. You may also develop pain in the stomach or lower back area. Let your cancer care team know immediately if you experience these symptoms.     ? Decrease in calcium levels (hypocalcemia): You may experience spasms, cramping, or numbness. Let your cancer care team know immediately if you experience these symptoms.       Side effects you may experience several days to weeks after starting Venetoclax:    Increased risk of infection: White blood cells, produced by your bone marrow, are responsible for fighting infection.  This chemotherapy may decrease the number of these cells causing you to be more at risk for developing an infection. To reduce your risk for infection, wash your hands often with soap and water and avoid large groups of people or people who are sick or have colds.        Make sure you have an oral thermometer at home and see your doctor or go to the closest emergency room right away if:  ? Your temperature equals or goes above 100.5??F (38??C)   ? You suddenly feel unwell (even with a normal temperature)    Do not take over-the-counter fever-reducing medications such as acetaminophen (Tylenol??), ibuprofen (Advil??) or aspirin unless instructed by your doctor.    Decreased red  blood cells (anemia):  Your bone marrow also produces red blood cells that carry oxygen throughout your body.  This chemotherapy can reduce the number of red blood cells which can make you feel tired or weak.  Let your doctor know if you have shortness of breath, dizziness, or difficulty breathing as this may be a sign that your red blood cells are low.  If the count becomes too low, you may be given a blood transfusion.  Increased bruising or bleeding:  In addition to red and white blood cells, your bone marrow also produces cells called platelets.  Platelets help your blood to clot.  Chemotherapy can reduce the production of platelets, putting you at higher risk for bleeding.  To decrease this risk, use an electric razor for shaving, a soft bristle tooth brush, avoid playing contact sports, and avoid taking aspirin, ibuprofen (Advil??), or naproxen (Aleve??) without talking to your cancer care team first.   Feeling tired, run down:  This is a very common side effect during chemotherapy and may be worse after you have had several cycles of treatment.  Light exercise and staying well hydrated (with water or drinks like diluted Gatorade or Pedialyte) may help to improve energy, appetite, and quality of life.  You may also need to take short naps throughout the day.  Keep your health professionals informed about how you are feeling, especially if you feel that you are not able to do what you enjoy doing the most.        Reproductive Issues:    Pregnancy:  Chemotherapy can have harmful and potentially deadly effects on unborn children. Therefore you should use appropriate birth control methods while you are receiving chemotherapy and for several months after.  Your doctor can recommend appropriate birth control methods, though barrier methods, such as condoms, are preferred.      This education material is to supplement and support, but not replace, the conversation you have with your oncologist or cancer care team.

## 2019-02-04 NOTE — Unmapped (Signed)
Today's visit was intended to provide med rec as well as education and expectations going into Cycle 1 of azacitidine/venetoclax, D1 planned for 10/5.    Of note the patient has documented developmental delays, however is able to respond appropriately to medication list. Although there seems to be several inconsistencies depending on who is interviewing him. Notable findings on medication list as it relates to upcoming therapy and ROS:  - Patient is taking atorvastatin. This will have to be discontinued due to interaction with posaconazole  - Patient is on Celexa. EKG to monitor for QTc prolongation in light of other offenders (ie zofran, levaquin, posaconazole) will be necessary  - Patient supports that he has STOPPED aspirin and plavix as instructed - although reported taking plavix when asked previously (both should be OFF)  - Patient supports that he is taking TRAMADOL and not Toradol - although he repeated to say he was taking Toradol but was likely confusing the names  - Patient having PLT 20-30's, reporting watery black stools, but inconsistently reporting severity. Hgb drop from 9.8 to 6.2 over week's time, but confounded by diagnosis. Consider GIB work-up  - Med history paused around metoprolol - could not tell us what form he is taking  - Venetoclax and posaconazole are both on his medication list, but only for access investigation. He is not taking at this time.     The decision was made to admit on 10/5 for medication management and to assist in the venetoclax ramp-up. I outlined my plan with calendar in patient AVS although was unable to provide this education to him due to concern of intellectual barriers and barriers of doing so over the phone.  - Venetoclax and posaconazole both affordable at [facility]. Will have sent to [facility] and should be provided to patient when started  - Ramp-up as follows:  Day 1 = 100 mg  Day 2 = 200 mg  Day 3-7 = 400 mg  Day 8 = venetoclax to 100 mg and start posaconazole 300 mg daily  - He is expected to go neutropenic so rather than wait for ANC to < 0.5 plan on starting posaconazole Day 8 regardless to avoid confusion.   - Similarly he is already on levofloxacin  - Should be staying with his sister following the ramp-up. She should be included in discussion of medications and education   - Follow-up with CPP should remain the same (Day 8 and Day 15 IN PERSON visits - may need to change). Depending on timing of discharge, happy to touch base with patient prior to Day 8 scheduled visit.      Manfred Arch, PharmD, BCOP, CPP  Pager: [phone]

## 2019-02-05 ENCOUNTER — Telehealth: Payer: Self-pay

## 2019-02-05 ENCOUNTER — Other Ambulatory Visit: Payer: Self-pay

## 2019-02-05 ENCOUNTER — Telehealth: Payer: Self-pay | Admitting: Internal Medicine

## 2019-02-05 ENCOUNTER — Ambulatory Visit: Payer: Medicare HMO | Admitting: Internal Medicine

## 2019-02-05 NOTE — Telephone Encounter (Signed)
01/30/19 appt [facility]  Plans/Recommendations: - go back [facility] on 10/5 for visit with Denman George NP and our pharmacist and for C1D1 of Azacitadine/Venetoclax medication  - Recommended twice weekly labs locally in the interim as detailed below - Continue daily acyclovir and allopurinol.=medication   - Start antibiotic levaquin 500 mg daily (ANC is 1.0 and will continued to downtrend).  Recommended discontinuing hydroxyzine (to avoid additional QTc prolonging medications).  - Will look into establishing care with a local Oncologist in [place] as this is close to his sister's (Connie's) house.  Knee Pain - Will start tramadol 50 mg Q8H prn and recommended alternating with tylenol.  - Recommended discontinuing flexeril, given possible interaction with tramadol.  Was  ASA 81 and Plavix, which we have discontinued given degree of thrombocytopenia. - Possible episode of angina a few days ago, suspect secondary to anemia in setting of known hx of angina/CAD.  Abdominal Pain Follows with GI locally. Etiology of pain is unclear. CT of the A/P in June was unrevealing; notably spleen was normal in size. Plan was to undergo EGD/colonoscopy, but this will likely have to be delayed given new diagnosis of AML and degree of thrombocytopenia.  .  Pt was seen and discussed with Dr. Janene Madeira H/o [facility]   rec call [facility] to clarify if confused on meds as well   Given phone #

## 2019-02-05 NOTE — Telephone Encounter (Signed)
Thanks for FYI   [employer]

## 2019-02-05 NOTE — Telephone Encounter (Signed)
Pre-visit assessment call attempted prior to [place] appointment on 02/06/2019 with Dr. Tasia Catchings. No answer / unable to leave voicemail.

## 2019-02-05 NOTE — Progress Notes (Signed)
Tried to call pt 3x and sister Marlowe Kays no answer will cancel appt for today   Adrian

## 2019-02-05 NOTE — Telephone Encounter (Signed)
Called sister Marlowe Kays. Advise her to follow [facility] Oncologist's recommendation to stop Atarax and Flexeril.  Patient is moving his care to [place]. I will see him this week for transfusion and he will no long follow up with me anymore. FYI

## 2019-02-05 NOTE — Telephone Encounter (Signed)
This is question for oncologist please call patient   Nicholas Reyes

## 2019-02-05 NOTE — Unmapped (Signed)
L/m for [name] explaining the plan for her brother for next week; I.e., come to [facility] for clinic visit with Oliver Hum on Monday 10/5, and Angie will assess pt to consider start of treatment 10/6. If all is good to proceed, pt will be admitted 10/6 at noon for four days of venetoclax ramp up and aza. Explained rationale for admission to keep pt safe and monitor, giving fluids as needed, and ease burden on back and forth travel from [place]. Asked that Ms. Remus Loffler return call to Nurse Triage to touch base. Emotional support provided.

## 2019-02-06 ENCOUNTER — Inpatient Hospital Stay: Payer: Medicare HMO

## 2019-02-06 ENCOUNTER — Inpatient Hospital Stay: Payer: Medicare HMO | Admitting: Oncology

## 2019-02-06 ENCOUNTER — Encounter: Payer: Self-pay | Admitting: Oncology

## 2019-02-06 ENCOUNTER — Telehealth: Payer: Self-pay

## 2019-02-06 ENCOUNTER — Telehealth: Payer: Self-pay | Admitting: Internal Medicine

## 2019-02-06 DIAGNOSIS — Z7189 Other specified counseling: Secondary | ICD-10-CM | POA: Insufficient documentation

## 2019-02-06 DIAGNOSIS — 419620001 Death: Secondary | SNOMED CT | POA: Diagnosis not present

## 2019-02-06 MED FILL — VENCLEXTA 100 MG TABLET: ORAL | 30 days supply | Qty: 120 | Fill #0

## 2019-02-06 MED FILL — VENCLEXTA 100 MG TABLET: 30 days supply | Qty: 120 | Fill #0 | Status: AC

## 2019-02-06 NOTE — Telephone Encounter (Signed)
Patient called stating he was returning a call from our office.    I called patient informing him that he had an appt this morning for lab/MD/poss blood transfusion.  I do not know of any calls that we may have made to patient.

## 2019-02-06 NOTE — Telephone Encounter (Signed)
Patient was informed of missed appts today.  There is no infusion chair availability for possible transfusion tomorrow.   Dr. Tasia Catchings is going to let [facility] know.

## 2019-02-06 NOTE — Telephone Encounter (Signed)
Spoke with sister Blanch Media rec pt re-locate to [facility] to be closer to H/o and resources  Sister Blanch Media in [place] take care of hiim or help him (I.e housing/living and transportation) and needs help   If patient moves with sister Marlowe Kays this will be in [place] town near [place] but I think he would benefit living closer to [facility]   02/10/2019 will start chemo for AML and be hospitalized x 4 days  Will speak with Dr. Melody Comas H/o    [facility]

## 2019-02-06 NOTE — Unmapped (Signed)
Hi Dr. Vertell Limber,    Dr. Desma Maxim has contacted the Communication Center requesting to speak with you directly regarding the following:    Wants to consult regarding Raymond [name]    Dr. Shirlee Latch is available for a call back between 2:00 & 5:00.  The best number to call back is [phone]    A page has also been sent.    Thank you,  Kelli Hope  [facility]  [phone]

## 2019-02-06 DEATH — deceased

## 2019-02-07 NOTE — Unmapped (Signed)
[facility]  Oncology Outpatient Social Work      Reason for Contact:  Lodging assistance    Psychosocial Update and Case Management:  T/C to patient's sister/caregiver, [name] [phone]), to figure out a lodging plan for 02/10/19 for Pt's clinic appt day prior to outpt infusion on 02/11/19.  [name] and her husband plan to come and would like to stay at the [facility] on 10/5, while they will take Pt to hs apartment in [place] to stay overnight.  Submitted [facility] referral and explained to [name] that she will need to call them Monday morning to confirm their need for the room that night.     Pt and family are also eligible to apply for [employer] gas card assistance.  Completed [employer] gas card program application and provided to Cindie Crumbly, [employer] Patient Assistance Counselor, for approval.    Follow-Up Plan:  Once approval for gas cards is received, this SW'er will provide Pt with cards at appt in clinic on 02/10/19.       Claris Gladden, OSW-C  Oncology Outpatient Social Worker  Phone: [phone]

## 2019-02-08 ENCOUNTER — Emergency Department
Admission: EM | Admit: 2019-02-08 | Discharge: 2019-02-08 | Disposition: A | Discharge status: Home / Self Care | Payer: Medicare HMO | Attending: Student | Admitting: Student

## 2019-02-08 ENCOUNTER — Encounter: Payer: Self-pay | Admitting: Emergency Medicine

## 2019-02-08 ENCOUNTER — Other Ambulatory Visit: Payer: Self-pay

## 2019-02-08 ENCOUNTER — Emergency Department: Payer: Medicare HMO

## 2019-02-08 DIAGNOSIS — C9200 Acute myeloblastic leukemia, not having achieved remission: Secondary | ICD-10-CM

## 2019-02-08 DIAGNOSIS — Z955 Presence of coronary angioplasty implant and graft: Secondary | ICD-10-CM | POA: Insufficient documentation

## 2019-02-08 DIAGNOSIS — I10 Essential (primary) hypertension: Secondary | ICD-10-CM | POA: Diagnosis not present

## 2019-02-08 DIAGNOSIS — D61810 Antineoplastic chemotherapy induced pancytopenia: Secondary | ICD-10-CM | POA: Diagnosis not present

## 2019-02-08 DIAGNOSIS — Z79899 Other long term (current) drug therapy: Secondary | ICD-10-CM | POA: Insufficient documentation

## 2019-02-08 DIAGNOSIS — J45909 Unspecified asthma, uncomplicated: Secondary | ICD-10-CM | POA: Diagnosis not present

## 2019-02-08 DIAGNOSIS — Z87891 Personal history of nicotine dependence: Secondary | ICD-10-CM | POA: Diagnosis not present

## 2019-02-08 DIAGNOSIS — Z7984 Long term (current) use of oral hypoglycemic drugs: Secondary | ICD-10-CM | POA: Insufficient documentation

## 2019-02-08 DIAGNOSIS — D61818 Other pancytopenia: Secondary | ICD-10-CM

## 2019-02-08 DIAGNOSIS — R7989 Other specified abnormal findings of blood chemistry: Secondary | ICD-10-CM | POA: Diagnosis present

## 2019-02-08 DIAGNOSIS — C9400 Acute erythroid leukemia, not having achieved remission: Secondary | ICD-10-CM | POA: Insufficient documentation

## 2019-02-08 DIAGNOSIS — E119 Type 2 diabetes mellitus without complications: Secondary | ICD-10-CM | POA: Insufficient documentation

## 2019-02-08 DIAGNOSIS — I259 Chronic ischemic heart disease, unspecified: Secondary | ICD-10-CM | POA: Insufficient documentation

## 2019-02-08 DIAGNOSIS — R5383 Other fatigue: Secondary | ICD-10-CM | POA: Diagnosis not present

## 2019-02-08 LAB — CBC WITH DIFFERENTIAL/PLATELET
Abs Immature Granulocytes: 0.04 10*3/uL (ref 0.00–0.07)
Basophils Absolute: 0 10*3/uL (ref 0.0–0.1)
Basophils Relative: 2 %
Eosinophils Absolute: 0 10*3/uL (ref 0.0–0.5)
Eosinophils Relative: 0 %
HCT: 23.8 % — ABNORMAL LOW (ref 39.0–52.0)
Hemoglobin: 7.6 g/dL — ABNORMAL LOW (ref 13.0–17.0)
Immature Granulocytes: 2 %
Lymphocytes Relative: 39 %
Lymphs Abs: 1.1 10*3/uL (ref 0.7–4.0)
MCH: 30.8 pg (ref 26.0–34.0)
MCHC: 31.9 g/dL (ref 30.0–36.0)
MCV: 96.4 fL (ref 80.0–100.0)
Monocytes Absolute: 0.5 10*3/uL (ref 0.1–1.0)
Monocytes Relative: 18 %
Neutro Abs: 1 10*3/uL — ABNORMAL LOW (ref 1.7–7.7)
Neutrophils Relative %: 39 %
Platelets: 17 10*3/uL — CL (ref 150–400)
RBC: 2.47 MIL/uL — ABNORMAL LOW (ref 4.22–5.81)
RDW: 16 % — ABNORMAL HIGH (ref 11.5–15.5)
WBC: 2.7 10*3/uL — ABNORMAL LOW (ref 4.0–10.5)
nRBC: 15.5 % — ABNORMAL HIGH (ref 0.0–0.2)

## 2019-02-08 LAB — CULTURE, BLOOD (ROUTINE X 2)
Culture: NO GROWTH
Culture: NO GROWTH
Special Requests: ADEQUATE
Special Requests: ADEQUATE

## 2019-02-08 LAB — COMPREHENSIVE METABOLIC PANEL
ALT: 12 U/L (ref 0–44)
AST: 24 U/L (ref 15–41)
Albumin: 3.3 g/dL — ABNORMAL LOW (ref 3.5–5.0)
Alkaline Phosphatase: 65 U/L (ref 38–126)
Anion gap: 10 (ref 5–15)
BUN: 14 mg/dL (ref 8–23)
CO2: 33 mmol/L — ABNORMAL HIGH (ref 22–32)
Calcium: 8.7 mg/dL — ABNORMAL LOW (ref 8.9–10.3)
Chloride: 94 mmol/L — ABNORMAL LOW (ref 98–111)
Creatinine, Ser: 0.7 mg/dL (ref 0.61–1.24)
GFR calc Af Amer: >60 mL/min (ref >60)
GFR calc non Af Amer: >60 mL/min (ref >60)
Glucose, Bld: 137 mg/dL — ABNORMAL HIGH (ref 70–99)
Potassium: 3.9 mmol/L (ref 3.5–5.1)
Sodium: 137 mmol/L (ref 135–145)
Total Bilirubin: 1.2 mg/dL (ref 0.3–1.2)
Total Protein: 6.9 g/dL (ref 6.5–8.1)

## 2019-02-08 LAB — PREPARE RBC (CROSSMATCH)

## 2019-02-08 MED ORDER — SODIUM CHLORIDE 0.9% IV SOLUTION
Freq: Once | INTRAVENOUS | Status: DC
  Filled 2019-02-08: qty 250

## 2019-02-08 NOTE — ED Notes (Signed)
Patient given graham crackers and peanut butter at this time.

## 2019-02-08 NOTE — ED Notes (Addendum)
FIRST NURSE NOTE:  Pt to registration desk, repeatedly telling staff that we need to come outside to talk to his sister on the phone about why he is here. Explained to patient multiple times that we will talk to her back in triage, pt continues to be adamant that we talk to his sister and states that she is long distance. Pt states he was here because [facility] told him to come to have something done.

## 2019-02-08 NOTE — ED Notes (Signed)
Pt inquiring about how much longer it would be before he is taken to a room. Pt informed that we are unable to give wait times. Pt asking for something to eat and drink. Pt informed that he cannot eat or drink until he sees the MD.

## 2019-02-08 NOTE — ED Provider Notes (Signed)
[facility] Emergency Department Provider Note  ____________________________________________   First MD Initiated Contact with Patient 02/08/19 1549     (approximate)  I have reviewed the triage vital signs and the nursing notes.  History  Chief Complaint Anemia    HPI TRUSTIN FAVORITO is a 63 y.o. male with known history of AML (followed at [facility] by Dr. Tasia Catchings, and at [facility] by Dr. Janene Madeira) who presents for abnormal labs.  On arrival, the patient is unable to state why he is here, and asked me to call his sister.  In discussion with his sister, the patient has a history of AML and is followed both here and at [facility]. He has a clinic appointment with [facility] on Monday to evaluate for potential admission and initiation of treatment on Tuesday.  However, he recently had outpatient labs done that were abnormal.  They were called by his [facility] physician (Dr. Tasia Catchings) and recommended transfusion. He was apparently supposed to have this done on Thursday, but the patient forgot about his appointment, therefore his sister brought him to the emergency department today.  The patient denies any SOB, chest pain, lightheadedness, or fatigue currently. No fevers, nausea, vomiting, diarrhea. He reports some ongoing, unchanged exertional fatigue, but otherwise has no acute complaints in the ED.  Virgel Paling [phone]   Ricky Ala [phone]   Past Medical Hx Past Medical History:  Diagnosis Date  . Arthritis   . Asthma   . CAD (coronary artery disease)   . COPD (chronic obstructive pulmonary disease) ([place])   . Depression   . Diabetes mellitus   . Dyspnea    with exertion  . GERD (gastroesophageal reflux disease)   . Heart disease    cad with 3 stents   . Hypertension   . MI (myocardial infarction) ([place])    3 stents  . Migraines    migraines  . Psoriasis    joint pain in knees left hand   . Sleep apnea    cpap    Problem List Patient Active Problem List   Diagnosis Date Noted  . Goals of care, counseling/discussion 02/06/2019  . Right anterior knee pain 01/27/2019  . Leukemia ([place]) 01/21/2019  . Acute myeloid leukemia not having achieved remission ([place]) 01/21/2019  . Neutropenia ([place]) 01/21/2019  . Macrocytic anemia 01/21/2019  . Leukopenia 12/17/2018  . Thrombocytopenia ([place]) 12/17/2018  . Enteritis 04/10/2018  . 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 Chronic L5-S1 lumbar pars defect (Left) 03/27/2018  . Grade 1 Anterolisthesis of L5 over S1 03/27/2018  . Chronic knee pain (Right) 03/27/2018  . Chronic knee pain (Left) 03/27/2018  . Chronic patellofemoral knee pain (Left) 03/27/2018  . History of nephrolithiasis 03/27/2018  . Right flank pain 02/13/2018  . Osteoarthritis of knee (Bilateral) (R>L) 01/28/2018  . Tricompartment osteoarthritis of knee (Right) 01/28/2018  . Osteoarthritis of hands (Bilateral) 01/28/2018  . Chronic anticoagulation (PLAVIX) 01/28/2018  . Chronic knee pain (Primary Area of Pain) (Bilateral) (R>L) 01/02/2018  . Chronic hand pain (Secondary Area of Pain) (Bilateral) (L>R) 01/02/2018  . Chronic pain syndrome 01/02/2018  . Long term current use of opiate analgesic 01/02/2018  . Pharmacologic therapy 01/02/2018  . Disorder of skeletal system 01/02/2018  . Problems influencing health status 01/02/2018  . Cognitive impairment 12/11/2017  . DM2 (diabetes mellitus, type 2) ([place]) 12/04/2017  . Psoriasis 11/15/2017  . Skin sore 11/15/2017  . Diabetes mellitus without complication ([place]) [id number]  . Vitamin D deficiency 11/15/2017  . Essential hypertension  06/11/2017  . Coronary artery disease involving native coronary artery of native heart without angina pectoris 06/11/2017  . Exercise-induced angina ([place]) 01/31/2016  . Hypertension 05/18/2014  . Edema 02/09/2014  . Mixed hyperlipidemia 04/02/2013  . HTN (hypertension) 04/02/2013  . Chest pain with high risk for cardiac etiology 12/12/2012  . Dyspnea 12/12/2012   . Infective otitis externa 09/25/2012  . Coronary atherosclerosis 09/02/2012  . Otalgia 09/02/2012  . Benign essential hypertension 08/02/2012  . Chronic airway obstruction, not elsewhere classified 08/02/2012  . Depressive disorder, not elsewhere classified 08/02/2012  . Family history of ischemic heart disease (IHD) 07/08/2012  . Family history of ischemic heart disease and other diseases of the circulatory system 07/08/2012  . Allergic rhinitis 07/05/2012  . Other psoriasis 07/05/2012  . Benign neoplasm of colon 09/22/2010  . Arthritis 06/09/2010  . Asthma 06/09/2010  . Diarrhea 06/09/2010  . Headaches due to old head injury 06/09/2010  . Type 2 diabetes mellitus without complications ([place]) [id number]  . Abdominal pain 06/09/2010    Past Surgical Hx Past Surgical History:  Procedure Laterality Date  . APPENDECTOMY    . CATARACT EXTRACTION Bilateral    inserted lens b/l eyes right 03/12/17, left 05/28/17   . CHOLECYSTECTOMY    . CORONARY ANGIOPLASTY WITH STENT PLACEMENT     LAD, LCX-OM, ? 3rd location  . EYE SURGERY     cataract b/l   . HEMORRHOID SURGERY    . hemorrhoid surgery      x 2   . KNEE SURGERY     right knee arthroscopy, partial synovectecomy 04/21/15 and meniscectomy/chondroplasty   . LEFT HEART CATH AND CORONARY ANGIOGRAPHY Left 01/30/2018   Procedure: LEFT HEART CATH AND CORONARY ANGIOGRAPHY;  Surgeon: Isaias Cowman, MD;  Location: [facility];  Service: Cardiovascular;  Laterality: Left;  . right knee surgery     ? type of procedure   . STENT PLACEMENT VASCULAR ([place] HX)    . TONSILLECTOMY    . TONSILLECTOMY AND ADENOIDECTOMY      Medications Prior to Admission medications   Medication Sig Start Date End Date Taking? Authorizing Provider  acetaminophen (TYLENOL) 500 MG tablet Take 1 tablet (500 mg total) by mouth every 6 (six) hours as needed. 09/19/18   Laban Emperor, PA-C  albuterol (VENTOLIN HFA) 108 (90 Base) MCG/ACT inhaler Inhale  1-2 puffs into the lungs every 6 (six) hours as needed. For shortness of breath 01/31/19   McLean-Scocuzza, Nino Glow, MD  allopurinol (ZYLOPRIM) 300 MG tablet Take by mouth. 01/23/19 01/23/20  [provider]  amLODipine (NORVASC) 5 MG tablet Take 5 mg by mouth daily.    [provider]  atorvastatin (LIPITOR) 20 MG tablet Take 1 tablet (20 mg total) by mouth daily at 6 PM. 10/03/18   McLean-Scocuzza, Nino Glow, MD  budesonide-formoterol [facility]) 160-4.5 MCG/ACT inhaler Inhale 2 puffs into the lungs 2 (two) times daily. Rinse mouth 05/14/18   McLean-Scocuzza, Nino Glow, MD  cetirizine (ZYRTEC) 10 MG tablet Take 10 mg by mouth daily.    [provider]  Cholecalciferol 50000 units capsule Take 1 capsule (50,000 Units total) by mouth once a week. 12/10/17   McLean-Scocuzza, Nino Glow, MD  clobetasol ointment (TEMOVATE) [id number] % Apply 1 application topically 2 (two) times daily. Right ear as needed not face or private 04/10/18   McLean-Scocuzza, Nino Glow, MD  clotrimazole (LOTRIMIN) 1 % cream Apply 1 application topically 2 (two) times daily. 09/20/18   McLean-Scocuzza, Nino Glow, MD  diclofenac (FLECTOR) 1.3 % PTCH Place 1 patch onto the skin 2 (two) times daily. 11/30/17   McLean-Scocuzza, Nino Glow, MD  dicyclomine (BENTYL) 10 MG capsule Take 1 capsule (10 mg total) by mouth 4 (four) times daily -  before meals and at bedtime. 01/08/19   Jonathon Bellows, MD  Docusate Sodium 100 MG capsule Take by mouth. 02/01/19 06/01/19  [provider]  DULoxetine (CYMBALTA) 60 MG capsule Take 1 capsule (60 mg total) by mouth daily. 01/11/18   McLean-Scocuzza, Nino Glow, MD  fluticasone (FLONASE) 50 MCG/ACT nasal spray Place 2 sprays into both nostrils daily. 05/14/18   McLean-Scocuzza, Nino Glow, MD  furosemide (LASIX) 40 MG tablet Take 1 tablet (40 mg total) by mouth 2 (two) times daily. In am and at lunch 07/10/18   McLean-Scocuzza, Nino Glow, MD  glucose blood test strip 1 each by Other route 3 (three) times daily as  needed. DX- E11.9 Diabetes Mellitus type 2. 11/06/18   McLean-Scocuzza, Nino Glow, MD  hydrocortisone 2.5 % cream Apply topically 2 (two) times daily. Left face left of eye 07/10/18   McLean-Scocuzza, Nino Glow, MD  hydrOXYzine (ATARAX/VISTARIL) 25 MG tablet Take 1 tablet (25 mg total) by mouth 3 (three) times daily as needed. 05/14/18   McLean-Scocuzza, Nino Glow, MD  isosorbide mononitrate (IMDUR) 30 MG 24 hr tablet Take 1 tablet (30 mg total) by mouth daily. 01/11/18   McLean-Scocuzza, Nino Glow, MD  ketorolac (ACULAR) 0.5 % ophthalmic solution  12/03/17   [provider]  levofloxacin (LEVAQUIN) 500 MG tablet Take by mouth. 01/30/19 03/01/19  [provider]  metFORMIN (GLUCOPHAGE) 500 MG tablet Take 1 tablet (500 mg total) by mouth daily with breakfast. 11/23/17   McLean-Scocuzza, Nino Glow, MD  metoprolol tartrate (LOPRESSOR) 25 MG tablet Take 25 mg by mouth 2 (two) times daily.    [provider]  montelukast (SINGULAIR) 10 MG tablet Take 10 mg by mouth at bedtime.    [provider]  mupirocin ointment (BACTROBAN) 2 % Apply 1 application topically 2 (two) times daily. Any open sores 07/10/18   McLean-Scocuzza, Nino Glow, MD  nitroGLYCERIN (NITROSTAT) 0.4 MG SL tablet Place 0.4 mg under the tongue every 5 (five) minutes as needed. For chest pain    [provider]  omeprazole (PRILOSEC) 40 MG capsule Take 1 capsule (40 mg total) by mouth daily. Patient not taking: Reported on 01/08/2019 06/26/18 08/27/18  Jonathon Bellows, MD  ondansetron (ZOFRAN) 4 MG tablet Take 1 tablet (4 mg total) by mouth every 8 (eight) hours as needed for nausea or vomiting. Patient not taking: Reported on 01/08/2019 12/19/17   Jodelle Green, FNP  oxyCODONE-acetaminophen (PERCOCET) 5-325 MG tablet Take 1 tablet by mouth 2 (two) times daily as needed for severe pain. Patient not taking: Reported on 12/19/2018 09/20/18   McLean-Scocuzza, Nino Glow, MD  pantoprazole ([place]) 20 MG tablet TAKE 1 TABLET EVERY DAY  Patient not taking: Reported on 01/08/2019 04/29/18   Burnard Hawthorne, FNP  polyethylene glycol (MIRALAX / Floria Raveling) 17 g packet Take by mouth. 02/01/19 06/01/19  [provider]  posaconazole (NOXAFIL) 100 MG TBEC delayed-release tablet Take by mouth. 01/30/19   [provider]  potassium chloride SA (K-DUR,KLOR-CON) 20 MEQ tablet Take 1 tablet (20 mEq total) by mouth daily. 07/10/18   McLean-Scocuzza, Nino Glow, MD  prednisoLONE acetate (PRED FORTE) 1 % ophthalmic suspension  11/30/17   [provider]  Secukinumab (COSENTYX 300 DOSE [place]) Inject into the skin every  30 (thirty) days.    [provider]  senna (SENNA-LAX) 8.6 MG tablet Take by mouth. 02/01/19 06/01/19  [provider]  spironolactone (ALDACTONE) 25 MG tablet Take 1 tablet (25 mg total) by mouth daily. In am for blood pressure 05/03/18   McLean-Scocuzza, Nino Glow, MD  sucralfate (CARAFATE) 1 g tablet Take 1 tablet (1 g total) by mouth 4 (four) times daily -  with meals and at bedtime. 02/24/18 02/24/19  Merlyn Lot, MD  traMADol Veatrice [place]bon) 50 MG tablet Take by mouth. 01/30/19   [provider]  triamcinolone cream (KENALOG) 0.1 % Apply 1 application topically 2 (two) times daily. Not face, groin, underarms 11/15/17   McLean-Scocuzza, Nino Glow, MD  valACYclovir (VALTREX) 500 MG tablet Take by mouth. 01/23/19 02/22/19  [provider]  venetoclax 100 MG TABS Take by mouth. 01/30/19   [provider]    Allergies Patient has no known allergies.  Family Hx Family History  Problem Relation Age of Onset  . Cancer Mother   . Heart disease Father   . Stroke Father   . Heart disease Brother     Social Hx Social History   Tobacco Use  . Smoking status: Former Smoker    Years: 5.00    Types: Cigarettes  . Smokeless tobacco: Former Systems developer    Quit date: 12/19/2015  . Tobacco comment: 3-4 cig every couple days  Substance Use Topics  . Alcohol use: No    Frequency:  Never  . Drug use: No     Review of Systems  Constitutional: Negative for fever, chills. Eyes: Negative for visual changes. ENT: Negative for sore throat. Cardiovascular: Negative for chest pain. Respiratory: Negative for shortness of breath. Gastrointestinal: Negative for nausea, vomiting.  Genitourinary: Negative for dysuria. Musculoskeletal: Negative for leg swelling. Skin: Negative for rash. Neurological: Negative for for headaches.   Physical Exam  Vital Signs: ED Triage Vitals  Enc Vitals Group     BP 02/08/19 1338 (!) 144/55     Pulse Rate 02/08/19 1338 96     Resp 02/08/19 1338 16     Temp 02/08/19 1338 98.7 F (37.1 C)     Temp Source 02/08/19 1338 Oral     SpO2 02/08/19 1338 93 %     Weight 02/08/19 1338 210 lb (95.3 kg)     Height 02/08/19 1338 5\' 10"  (1.778 m)     Head Circumference --      Peak Flow --      Pain Score 02/08/19 1345 6     Pain Loc --      Pain Edu? --      Excl. in [place]? --     Constitutional: Alert and oriented. Appears older than stated age.  Head: Normocephalic. Atraumatic. Eyes: Conjunctivae clear. Sclera anicteric. Nose: No congestion. No rhinorrhea. Mouth/Throat: Mucous membranes are moist.  Neck: No stridor.   Cardiovascular: Normal rate, regular rhythm. Extremities well perfused. Respiratory: Normal respiratory effort.  Lungs CTAB. Gastrointestinal: Soft. Non-tender. Non-distended.  Musculoskeletal: No lower extremity edema. No deformities. Neurologic:  Normal speech and language. No gross focal neurologic deficits are appreciated.  Skin: Skin is warm, dry and intact. No petechia or ecchymosis.  Psychiatric: Mood and affect are appropriate for situation.  EKG  N/A    Radiology  N/A   Procedures  Procedure(s) performed (including critical care):  .Critical Care Performed by: Lilia Pro., MD Authorized by: Lilia Pro., MD   Critical care provider statement:  Critical care time (minutes):  45    Critical care was time spent personally by me on the following activities:  Discussions with consultants, evaluation of patient's response to treatment, examination of patient, ordering and performing treatments and interventions, ordering and review of laboratory studies, ordering and review of radiographic studies, pulse oximetry, re-evaluation of patient's condition, obtaining history from patient or surrogate and review of old charts     Initial Impression / Assessment and Plan / ED Course  63 y.o. male with history of AML who presents to the ED for abnormal labs, as above.   Labs initiated in triage reveal: - WBC 2.7 from 2.9 (5 days ago) - Hemoglobin 7.6 from 7.1 - Platelets 17 from 14  Discussed patient with Dr. Tasia Catchings.  We will plan to transfuse 1 unit of irradiated RBCs, and then plan for discharge, with plans to follow-up at [facility] on Monday as above.  Dr. Tasia Catchings will discuss with his team at [facility].  Also discussed with his team at [facility] through the transfer center who is aware of his emergency department visit as well.  No other recommendations from their perspective. Patient agreeable with plan and transfusion.     Final Clinical Impression(s) / ED Diagnosis  Final diagnoses:  Pancytopenia ([place])       Note:  This document was prepared using Dragon voice recognition software and may include unintentional dictation errors.   Lilia Pro., MD 02/09/19 [phone]

## 2019-02-08 NOTE — ED Triage Notes (Signed)
Patient unable to state reason for visit. Requests call sister to find out reason. Sister contacted and states patient has AML and MD called and stated his blood counts were low.

## 2019-02-08 NOTE — ED Notes (Signed)
Patient resting quietly with eyes closed in no acute distress.  

## 2019-02-08 NOTE — ED Notes (Signed)
Pt refusing to be hooked up to monitor. Pt is ambulating around the room and asking for "nabs and a soda". Ambulated to hallway bathroom and again asking for "nabs and a soda". Pt informed of need to return to room. Pt given graham crackers, peanut butter and ginger ale with MD approval.

## 2019-02-08 NOTE — Discharge Instructions (Addendum)
Thank you for letting us take care of you in the emergency department today.   Please continue to take any regular, prescribed medications.   Please follow up with: - Your [facility] doctor's at your appointment on [facility]  Please return to the ER for any new or worsening symptoms.

## 2019-02-09 NOTE — Unmapped (Signed)
Hem/Onc Phone Triage Note    Caller: Dr Mort Sawyers from [facility] ED    Reason for Call:    Raymond [name] is a 63 y.o. with newly diagnosed AML. He presented to [facility] ED with his sister who said he told her Dr Vertell Limber said he needed to have a blood transfusion.     I see no documenation of this within our system. But, I do see that he was contacted on 9/29 by Milinda Antis to his sister letting her know he was to come to [facility] for a visit on 10/5 to determine if he can start therapy with azacitidine and venetoclax on 10/6 and be admitted.     Wbc 2.7, HgB 7.6, Plt 17 which are stable from blood counts here on 9/26 3.2/ 6.1/ 32). He is afebrile, otherwise well appearing, no evidence of bleeding, and has no symptoms of anemia.     Assessment/Plan:   - As he is stable. Recommended holding transfusions, and keeping appt with Mary[facility] Land on 10/5. If transfusions needed he can be transfused then.     Will forward to the primary team for follow-up.     Jeraldine Loots  February 08, 2019 5:36 PM

## 2019-02-09 NOTE — Unmapped (Signed)
Addended by: Arna Medici D on: 02/08/2019 09:47 PM     Modules accepted: Orders

## 2019-02-10 DIAGNOSIS — C9200 Acute myeloblastic leukemia, not having achieved remission: Secondary | ICD-10-CM

## 2019-02-10 LAB — CBC W/ AUTO DIFF
BASOPHILS ABSOLUTE COUNT: 0.1 10*9/L (ref 0.0–0.1)
BASOPHILS RELATIVE PERCENT: 4.7 %
EOSINOPHILS ABSOLUTE COUNT: 0 10*9/L (ref 0.0–0.4)
EOSINOPHILS RELATIVE PERCENT: 1 %
HEMATOCRIT: 27.9 % — ABNORMAL LOW (ref 41.0–53.0)
LARGE UNSTAINED CELLS: 12 % — ABNORMAL HIGH (ref 0–4)
LYMPHOCYTES ABSOLUTE COUNT: 1.5 10*9/L (ref 1.5–5.0)
LYMPHOCYTES RELATIVE PERCENT: 48.8 %
MEAN CORPUSCULAR HEMOGLOBIN: 32.1 pg (ref 26.0–34.0)
MEAN CORPUSCULAR VOLUME: 97 fL (ref 80.0–100.0)
MEAN PLATELET VOLUME: 11.5 fL — ABNORMAL HIGH (ref 7.0–10.0)
MONOCYTES ABSOLUTE COUNT: 0.2 10*9/L (ref 0.2–0.8)
MONOCYTES RELATIVE PERCENT: 6.1 %
NEUTROPHILS ABSOLUTE COUNT: 1 10*9/L — ABNORMAL LOW (ref 2.0–7.5)
NEUTROPHILS RELATIVE PERCENT: 32.5 %
PLATELET COUNT: 22 10*9/L — ABNORMAL LOW (ref 150–440)
RED BLOOD CELL COUNT: 2.87 10*12/L — ABNORMAL LOW (ref 4.50–5.90)
RED CELL DISTRIBUTION WIDTH: 20.3 % — ABNORMAL HIGH (ref 12.0–15.0)
WBC ADJUSTED: 3 10*9/L — ABNORMAL LOW (ref 4.5–11.0)

## 2019-02-10 LAB — URIC ACID: Urate:MCnc:Pt:Ser/Plas:Qn:: 3.7 — ABNORMAL LOW

## 2019-02-10 LAB — COMPREHENSIVE METABOLIC PANEL
ALKALINE PHOSPHATASE: 65 U/L (ref 38–126)
ALT (SGPT): 13 U/L (ref <50)
ANION GAP: 8 mmol/L (ref 7–15)
AST (SGOT): 28 U/L (ref 19–55)
BILIRUBIN TOTAL: 1 mg/dL (ref 0.0–1.2)
BLOOD UREA NITROGEN: 13 mg/dL (ref 7–21)
BUN / CREAT RATIO: 22
CALCIUM: 8.4 mg/dL — ABNORMAL LOW (ref 8.5–10.2)
CHLORIDE: 97 mmol/L — ABNORMAL LOW (ref 98–107)
CO2: 35 mmol/L — ABNORMAL HIGH (ref 22.0–30.0)
CREATININE: 0.58 mg/dL — ABNORMAL LOW (ref 0.70–1.30)
EGFR CKD-EPI AA MALE: >90 mL/min/{1.73_m2} (ref >=60)
EGFR CKD-EPI NON-AA MALE: >90 mL/min/{1.73_m2} (ref >=60)
GLUCOSE RANDOM: 85 mg/dL (ref 70–179)
POTASSIUM: 3.7 mmol/L (ref 3.5–5.0)
PROTEIN TOTAL: 6.6 g/dL (ref 6.5–8.3)

## 2019-02-10 LAB — POTASSIUM: Potassium:SCnc:Pt:Ser/Plas:Qn:: 3.7

## 2019-02-10 LAB — PHOSPHORUS: Phosphate:MCnc:Pt:Ser/Plas:Qn:: 3.7

## 2019-02-10 LAB — LACTATE DEHYDROGENASE: Lactate dehydrogenase:CCnc:Pt:Ser/Plas:Qn:Reaction: pyruvate to lactate: 1816 — ABNORMAL HIGH

## 2019-02-10 LAB — HEMOGLOBIN: Hemoglobin:MCnc:Pt:Bld:Qn:: 9.2 — ABNORMAL LOW

## 2019-02-10 LAB — SMEAR REVIEW

## 2019-02-10 LAB — MAGNESIUM: Magnesium:MCnc:Pt:Ser/Plas:Qn:: 1.7

## 2019-02-10 LAB — BPAM RBC
Blood Product Expiration Date: 202010232359
ISSUE DATE / TIME: 202010031837
Unit Type and Rh: 600

## 2019-02-10 LAB — TYPE AND SCREEN
ABO/RH(D): A POS
Antibody Screen: NEGATIVE
Unit division: 0

## 2019-02-10 NOTE — Unmapped (Signed)
Labs drawn and sent.  Care provided by [facility]

## 2019-02-10 NOTE — Unmapped (Signed)
[facility]  Oncology Outpatient Social Work      Met with Pt and his sister, [name], in [place] [facility].   Provided them with $20 in [employer] gas cards, already approved by Cindie Crumbly.       Claris Gladden, OSW-C  Oncology Outpatient Social Worker  Phone: [phone]

## 2019-02-11 ENCOUNTER — Ambulatory Visit: Admit: 2019-02-11 | Discharge: 2019-02-17 | Disposition: A | Discharge status: Home Health | Payer: MEDICARE

## 2019-02-11 ENCOUNTER — Other Ambulatory Visit: Admit: 2019-02-11 | Discharge: 2019-02-17 | Disposition: A | Discharge status: Home Health | Payer: MEDICARE

## 2019-02-11 ENCOUNTER — Ambulatory Visit
Admit: 2019-02-11 | Discharge: 2019-02-17 | Disposition: A | Discharge status: Home Health | Payer: MEDICARE | Attending: Pharmacist

## 2019-02-11 DIAGNOSIS — D649 Anemia, unspecified: Secondary | ICD-10-CM

## 2019-02-11 DIAGNOSIS — C9200 Acute myeloblastic leukemia, not having achieved remission: Secondary | ICD-10-CM

## 2019-02-11 DIAGNOSIS — F79 Unspecified intellectual disabilities: Secondary | ICD-10-CM | POA: Diagnosis not present

## 2019-02-11 DIAGNOSIS — L409 Psoriasis, unspecified: Secondary | ICD-10-CM | POA: Diagnosis not present

## 2019-02-11 DIAGNOSIS — R162 Hepatomegaly with splenomegaly, not elsewhere classified: Secondary | ICD-10-CM | POA: Diagnosis not present

## 2019-02-11 DIAGNOSIS — R918 Other nonspecific abnormal finding of lung field: Secondary | ICD-10-CM | POA: Diagnosis not present

## 2019-02-11 DIAGNOSIS — R0902 Hypoxemia: Secondary | ICD-10-CM | POA: Diagnosis not present

## 2019-02-11 DIAGNOSIS — G3184 Mild cognitive impairment, so stated: Secondary | ICD-10-CM | POA: Diagnosis not present

## 2019-02-11 DIAGNOSIS — C92A0 Acute myeloid leukemia with multilineage dysplasia, not having achieved remission: Secondary | ICD-10-CM | POA: Diagnosis not present

## 2019-02-11 DIAGNOSIS — R1110 Vomiting, unspecified: Secondary | ICD-10-CM | POA: Diagnosis not present

## 2019-02-11 DIAGNOSIS — R0682 Tachypnea, not elsewhere classified: Secondary | ICD-10-CM | POA: Diagnosis not present

## 2019-02-11 DIAGNOSIS — R590 Localized enlarged lymph nodes: Secondary | ICD-10-CM | POA: Diagnosis not present

## 2019-02-11 DIAGNOSIS — J45909 Unspecified asthma, uncomplicated: Secondary | ICD-10-CM | POA: Diagnosis not present

## 2019-02-11 DIAGNOSIS — R140 Abdominal distension (gaseous): Secondary | ICD-10-CM | POA: Diagnosis not present

## 2019-02-11 DIAGNOSIS — I2510 Atherosclerotic heart disease of native coronary artery without angina pectoris: Secondary | ICD-10-CM | POA: Diagnosis not present

## 2019-02-11 DIAGNOSIS — J9621 Acute and chronic respiratory failure with hypoxia: Secondary | ICD-10-CM | POA: Diagnosis not present

## 2019-02-11 DIAGNOSIS — D61810 Antineoplastic chemotherapy induced pancytopenia: Secondary | ICD-10-CM | POA: Diagnosis not present

## 2019-02-11 DIAGNOSIS — K921 Melena: Secondary | ICD-10-CM | POA: Diagnosis not present

## 2019-02-11 DIAGNOSIS — F419 Anxiety disorder, unspecified: Secondary | ICD-10-CM | POA: Diagnosis not present

## 2019-02-11 DIAGNOSIS — J441 Chronic obstructive pulmonary disease with (acute) exacerbation: Secondary | ICD-10-CM | POA: Diagnosis not present

## 2019-02-11 DIAGNOSIS — Z5111 Encounter for antineoplastic chemotherapy: Secondary | ICD-10-CM | POA: Diagnosis not present

## 2019-02-11 DIAGNOSIS — T451X5A Adverse effect of antineoplastic and immunosuppressive drugs, initial encounter: Secondary | ICD-10-CM | POA: Diagnosis not present

## 2019-02-11 DIAGNOSIS — J90 Pleural effusion, not elsewhere classified: Secondary | ICD-10-CM | POA: Diagnosis not present

## 2019-02-11 DIAGNOSIS — E119 Type 2 diabetes mellitus without complications: Secondary | ICD-10-CM | POA: Diagnosis not present

## 2019-02-11 DIAGNOSIS — R079 Chest pain, unspecified: Secondary | ICD-10-CM | POA: Diagnosis not present

## 2019-02-11 DIAGNOSIS — R109 Unspecified abdominal pain: Secondary | ICD-10-CM | POA: Diagnosis not present

## 2019-02-11 DIAGNOSIS — N289 Disorder of kidney and ureter, unspecified: Secondary | ICD-10-CM | POA: Diagnosis not present

## 2019-02-11 DIAGNOSIS — I313 Pericardial effusion (noninflammatory): Secondary | ICD-10-CM | POA: Diagnosis not present

## 2019-02-11 DIAGNOSIS — R5081 Fever presenting with conditions classified elsewhere: Secondary | ICD-10-CM | POA: Diagnosis not present

## 2019-02-11 DIAGNOSIS — D709 Neutropenia, unspecified: Secondary | ICD-10-CM | POA: Diagnosis not present

## 2019-02-11 DIAGNOSIS — J9811 Atelectasis: Secondary | ICD-10-CM | POA: Diagnosis not present

## 2019-02-11 DIAGNOSIS — J449 Chronic obstructive pulmonary disease, unspecified: Secondary | ICD-10-CM | POA: Diagnosis not present

## 2019-02-11 DIAGNOSIS — I10 Essential (primary) hypertension: Secondary | ICD-10-CM | POA: Diagnosis not present

## 2019-02-11 LAB — CBC W/ AUTO DIFF
BASOPHILS ABSOLUTE COUNT: 0.1 10*9/L (ref 0.0–0.1)
BASOPHILS RELATIVE PERCENT: 4 %
EOSINOPHILS ABSOLUTE COUNT: 0 10*9/L (ref 0.0–0.4)
HEMATOCRIT: 24.3 % — ABNORMAL LOW (ref 41.0–53.0)
HEMOGLOBIN: 7.9 g/dL — ABNORMAL LOW (ref 13.5–17.5)
LARGE UNSTAINED CELLS: 11 % — ABNORMAL HIGH (ref 0–4)
LYMPHOCYTES ABSOLUTE COUNT: 1.2 10*9/L — ABNORMAL LOW (ref 1.5–5.0)
LYMPHOCYTES RELATIVE PERCENT: 49.6 %
MEAN CORPUSCULAR HEMOGLOBIN CONC: 32.7 g/dL (ref 31.0–37.0)
MEAN CORPUSCULAR HEMOGLOBIN: 32.1 pg (ref 26.0–34.0)
MEAN CORPUSCULAR VOLUME: 98.3 fL (ref 80.0–100.0)
MEAN PLATELET VOLUME: 7.9 fL (ref 7.0–10.0)
MONOCYTES ABSOLUTE COUNT: 0.1 10*9/L — ABNORMAL LOW (ref 0.2–0.8)
MONOCYTES RELATIVE PERCENT: 5.6 %
NEUTROPHILS ABSOLUTE COUNT: 0.8 10*9/L — ABNORMAL LOW (ref 2.0–7.5)
NEUTROPHILS RELATIVE PERCENT: 32.3 %
PLATELET COUNT: 21 10*9/L — ABNORMAL LOW (ref 150–440)
RED BLOOD CELL COUNT: 2.47 10*12/L — ABNORMAL LOW (ref 4.50–5.90)
RED CELL DISTRIBUTION WIDTH: 19 % — ABNORMAL HIGH (ref 12.0–15.0)
WBC ADJUSTED: 2.5 10*9/L — ABNORMAL LOW (ref 4.5–11.0)

## 2019-02-11 LAB — URIC ACID: Urate:MCnc:Pt:Ser/Plas:Qn:: 3.8 — ABNORMAL LOW

## 2019-02-11 LAB — LACTATE DEHYDROGENASE: Lactate dehydrogenase:CCnc:Pt:Ser/Plas:Qn:Reaction: pyruvate to lactate: 1620 — ABNORMAL HIGH

## 2019-02-11 LAB — HYPOCHROMIA

## 2019-02-11 LAB — BASIC METABOLIC PANEL
ANION GAP: 6 mmol/L — ABNORMAL LOW (ref 7–15)
BLOOD UREA NITROGEN: 17 mg/dL (ref 7–21)
BUN / CREAT RATIO: 25
CALCIUM: 8.3 mg/dL — ABNORMAL LOW (ref 8.5–10.2)
CHLORIDE: 97 mmol/L — ABNORMAL LOW (ref 98–107)
CO2: 32 mmol/L — ABNORMAL HIGH (ref 22.0–30.0)
EGFR CKD-EPI AA MALE: >90 mL/min/{1.73_m2} (ref >=60)
EGFR CKD-EPI NON-AA MALE: >90 mL/min/{1.73_m2} (ref >=60)
GLUCOSE RANDOM: 119 mg/dL (ref 70–179)
SODIUM: 135 mmol/L (ref 135–145)

## 2019-02-11 LAB — MAGNESIUM: Magnesium:MCnc:Pt:Ser/Plas:Qn:: 1.5 — ABNORMAL LOW

## 2019-02-11 LAB — SMEAR REVIEW

## 2019-02-11 LAB — PHOSPHORUS: Phosphate:MCnc:Pt:Ser/Plas:Qn:: 5.2 — ABNORMAL HIGH

## 2019-02-11 LAB — POTASSIUM: Potassium:SCnc:Pt:Ser/Plas:Qn:: 4.1

## 2019-02-11 MED ORDER — CITALOPRAM HYDROBROMIDE 20 MG PO TABS
20.00 | ORAL_TABLET | ORAL | Status: DC
Start: 2019-02-18 — End: 2019-02-11

## 2019-02-11 MED ORDER — INSULIN LISPRO 100 UNIT/ML ~~LOC~~ SOLN
0.00 | SUBCUTANEOUS | Status: DC
Start: 2019-02-13 — End: 2019-02-11

## 2019-02-11 MED ORDER — VALACYCLOVIR HCL 500 MG PO TABS
500.00 | ORAL_TABLET | ORAL | Status: DC
Start: 2019-02-18 — End: 2019-02-11

## 2019-02-11 MED ORDER — SUCRALFATE 1 GM/10ML PO SUSP
1.00 | ORAL | Status: DC
Start: 2019-02-17 — End: 2019-02-11

## 2019-02-11 MED ORDER — DOCUSATE SODIUM 100 MG PO TABS
100.00 | ORAL_TABLET | ORAL | Status: DC
Start: ? — End: 2019-02-11

## 2019-02-11 MED ORDER — GENERIC EXTERNAL MEDICATION
Status: DC
Start: ? — End: 2019-02-11

## 2019-02-11 MED ORDER — ACETAMINOPHEN 325 MG PO TABS
650.00 | ORAL_TABLET | ORAL | Status: DC
Start: ? — End: 2019-02-11

## 2019-02-11 MED ORDER — LEVOFLOXACIN 500 MG PO TABS
500.00 | ORAL_TABLET | ORAL | Status: DC
Start: 2019-02-14 — End: 2019-02-11

## 2019-02-11 MED ORDER — NITROGLYCERIN 0.4 MG SL SUBL
0.40 | SUBLINGUAL_TABLET | SUBLINGUAL | Status: DC
Start: ? — End: 2019-02-11

## 2019-02-11 MED ORDER — LOPERAMIDE HCL 2 MG PO CAPS
2.00 | ORAL_CAPSULE | ORAL | Status: DC
Start: ? — End: 2019-02-11

## 2019-02-11 MED ORDER — DEXTROSE 50 % IV SOLN
12.50 | INTRAVENOUS | Status: DC
Start: ? — End: 2019-02-11

## 2019-02-11 MED ORDER — LOPERAMIDE HCL 2 MG PO CAPS
4.00 | ORAL_CAPSULE | ORAL | Status: DC
Start: ? — End: 2019-02-11

## 2019-02-11 MED ORDER — FLUTICASONE FUROATE-VILANTEROL 200-25 MCG/INH IN AEPB
1.00 | INHALATION_SPRAY | RESPIRATORY_TRACT | Status: DC
Start: 2019-02-18 — End: 2019-02-11

## 2019-02-11 MED ORDER — POLYETHYLENE GLYCOL 3350 17 G PO PACK
17.00 | PACK | ORAL | Status: DC
Start: ? — End: 2019-02-11

## 2019-02-11 MED ORDER — SENNOSIDES 8.6 MG PO TABS
2.00 | ORAL_TABLET | ORAL | Status: DC
Start: ? — End: 2019-02-11

## 2019-02-11 MED ORDER — ALLOPURINOL 300 MG PO TABS
300.00 | ORAL_TABLET | ORAL | Status: DC
Start: 2019-02-18 — End: 2019-02-11

## 2019-02-11 MED ORDER — DICLOFENAC SODIUM 1 % TD GEL
2.00 | TRANSDERMAL | Status: DC
Start: 2019-02-17 — End: 2019-02-11

## 2019-02-11 MED ORDER — AMLODIPINE BESYLATE 5 MG PO TABS
5.00 | ORAL_TABLET | ORAL | Status: DC
Start: 2019-02-18 — End: 2019-02-11

## 2019-02-11 MED ORDER — METOPROLOL SUCCINATE ER 25 MG PO TB24
25.00 | ORAL_TABLET | ORAL | Status: DC
Start: 2019-02-17 — End: 2019-02-11

## 2019-02-11 MED ORDER — TRIAMCINOLONE ACETONIDE 0.1 % EX OINT
TOPICAL_OINTMENT | CUTANEOUS | Status: DC
Start: 2019-02-12 — End: 2019-02-11

## 2019-02-11 MED ORDER — ALBUTEROL SULFATE (2.5 MG/3ML) 0.083% IN NEBU
2.50 | INHALATION_SOLUTION | RESPIRATORY_TRACT | Status: DC
Start: ? — End: 2019-02-11

## 2019-02-11 MED ORDER — PANTOPRAZOLE SODIUM 40 MG PO TBEC
40.00 | DELAYED_RELEASE_TABLET | ORAL | Status: DC
Start: 2019-02-15 — End: 2019-02-11

## 2019-02-12 DIAGNOSIS — J9811 Atelectasis: Secondary | ICD-10-CM | POA: Diagnosis not present

## 2019-02-12 DIAGNOSIS — R0902 Hypoxemia: Secondary | ICD-10-CM | POA: Diagnosis not present

## 2019-02-12 LAB — BLOOD GAS, VENOUS
BASE EXCESS VENOUS: 8.3 — ABNORMAL HIGH (ref -2.0–2.0)
BASE EXCESS VENOUS: 8.8 — ABNORMAL HIGH (ref -2.0–2.0)
HCO3 VENOUS: 35 mmol/L — ABNORMAL HIGH (ref 22–27)
HCO3 VENOUS: 35 mmol/L — ABNORMAL HIGH (ref 22–27)
O2 SATURATION VENOUS: 30.7 % — ABNORMAL LOW (ref 40.0–85.0)
O2 SATURATION VENOUS: 39.1 % — ABNORMAL LOW (ref 40.0–85.0)
PCO2 VENOUS: 73 mmHg (ref 40–60)
PH VENOUS: 7.3 — ABNORMAL LOW (ref 7.32–7.43)
PH VENOUS: 7.3 — ABNORMAL LOW (ref 7.32–7.43)
PO2 VENOUS: 24 mmHg — ABNORMAL LOW (ref 30–55)
PO2 VENOUS: 34 mmHg (ref 30–55)
PO2 VENOUS: 25 mmHg — ABNORMAL LOW (ref 30–55)

## 2019-02-12 LAB — CBC W/ AUTO DIFF
BASOPHILS ABSOLUTE COUNT: 0.1 10*9/L (ref 0.0–0.1)
BASOPHILS RELATIVE PERCENT: 3.7 %
EOSINOPHILS ABSOLUTE COUNT: 0 10*9/L (ref 0.0–0.4)
EOSINOPHILS RELATIVE PERCENT: 1.6 %
HEMOGLOBIN: 7 g/dL — ABNORMAL LOW (ref 13.5–17.5)
LYMPHOCYTES ABSOLUTE COUNT: 1.2 10*9/L — ABNORMAL LOW (ref 1.5–5.0)
LYMPHOCYTES RELATIVE PERCENT: 45.9 %
MEAN CORPUSCULAR HEMOGLOBIN CONC: 32.5 g/dL (ref 31.0–37.0)
MEAN CORPUSCULAR HEMOGLOBIN: 32.1 pg (ref 26.0–34.0)
MEAN CORPUSCULAR VOLUME: 99 fL (ref 80.0–100.0)
MEAN PLATELET VOLUME: 10.6 fL — ABNORMAL HIGH (ref 7.0–10.0)
MONOCYTES ABSOLUTE COUNT: 0.2 10*9/L (ref 0.2–0.8)
MONOCYTES RELATIVE PERCENT: 6 %
NEUTROPHILS ABSOLUTE COUNT: 0.9 10*9/L — ABNORMAL LOW (ref 2.0–7.5)
NEUTROPHILS RELATIVE PERCENT: 32.8 %
NUCLEATED RED BLOOD CELLS: 9 /100{WBCs} — ABNORMAL HIGH (ref <=4)
PLATELET COUNT: 20 10*9/L — ABNORMAL LOW (ref 150–440)
RED BLOOD CELL COUNT: 2.19 10*12/L — ABNORMAL LOW (ref 4.50–5.90)
RED CELL DISTRIBUTION WIDTH: 19 % — ABNORMAL HIGH (ref 12.0–15.0)
WBC ADJUSTED: 2.4 10*9/L — ABNORMAL LOW (ref 4.5–11.0)

## 2019-02-12 LAB — COMPREHENSIVE METABOLIC PANEL
ALBUMIN: 2.8 g/dL — ABNORMAL LOW (ref 3.5–5.0)
ALBUMIN: 3.1 g/dL — ABNORMAL LOW (ref 3.5–5.0)
ALKALINE PHOSPHATASE: 53 U/L (ref 38–126)
ALKALINE PHOSPHATASE: 61 U/L (ref 38–126)
ALT (SGPT): 11 U/L (ref <50)
ALT (SGPT): 15 U/L (ref <50)
ANION GAP: 8 mmol/L (ref 7–15)
ANION GAP: 6 mmol/L — ABNORMAL LOW (ref 7–15)
AST (SGOT): 28 U/L (ref 19–55)
AST (SGOT): 33 U/L (ref 19–55)
BILIRUBIN TOTAL: 0.5 mg/dL (ref 0.0–1.2)
BILIRUBIN TOTAL: 0.8 mg/dL (ref 0.0–1.2)
BLOOD UREA NITROGEN: 14 mg/dL (ref 7–21)
BLOOD UREA NITROGEN: 15 mg/dL (ref 7–21)
BUN / CREAT RATIO: 23
BUN / CREAT RATIO: 24
CALCIUM: 7.8 mg/dL — ABNORMAL LOW (ref 8.5–10.2)
CALCIUM: 8 mg/dL — ABNORMAL LOW (ref 8.5–10.2)
CHLORIDE: 94 mmol/L — ABNORMAL LOW (ref 98–107)
CHLORIDE: 94 mmol/L — ABNORMAL LOW (ref 98–107)
CO2: 31 mmol/L — ABNORMAL HIGH (ref 22.0–30.0)
CO2: 32 mmol/L — ABNORMAL HIGH (ref 22.0–30.0)
CREATININE: 0.61 mg/dL — ABNORMAL LOW (ref 0.70–1.30)
CREATININE: 0.63 mg/dL — ABNORMAL LOW (ref 0.70–1.30)
EGFR CKD-EPI AA MALE: >90 mL/min/{1.73_m2} (ref >=60)
EGFR CKD-EPI AA MALE: >90 mL/min/{1.73_m2} (ref >=60)
EGFR CKD-EPI NON-AA MALE: >90 mL/min/{1.73_m2} (ref >=60)
EGFR CKD-EPI NON-AA MALE: >90 mL/min/{1.73_m2} (ref >=60)
GLUCOSE RANDOM: 132 mg/dL (ref 70–179)
GLUCOSE RANDOM: 110 mg/dL (ref 70–179)
POTASSIUM: 3.6 mmol/L (ref 3.5–5.0)
POTASSIUM: 3.8 mmol/L (ref 3.5–5.0)
PROTEIN TOTAL: 5.4 g/dL — ABNORMAL LOW (ref 6.5–8.3)
PROTEIN TOTAL: 5.9 g/dL — ABNORMAL LOW (ref 6.5–8.3)
SODIUM: 133 mmol/L — ABNORMAL LOW (ref 135–145)

## 2019-02-12 LAB — URIC ACID
URIC ACID: 3.3 mg/dL — ABNORMAL LOW (ref 4.0–9.0)
Urate:MCnc:Pt:Ser/Plas:Qn:: 3.3 — ABNORMAL LOW
Urate:MCnc:Pt:Ser/Plas:Qn:: 3.7 — ABNORMAL LOW

## 2019-02-12 LAB — PRO-BNP: Natriuretic peptide.B prohormone N-Terminal:MCnc:Pt:Ser/Plas:Qn:: 175

## 2019-02-12 LAB — PHOSPHORUS
Phosphate:MCnc:Pt:Ser/Plas:Qn:: 4.1
Phosphate:MCnc:Pt:Ser/Plas:Qn:: 5.6 — ABNORMAL HIGH

## 2019-02-12 LAB — MEAN CORPUSCULAR VOLUME: Lab: 99

## 2019-02-12 LAB — SODIUM: Sodium:SCnc:Pt:Ser/Plas:Qn:: 133 — ABNORMAL LOW

## 2019-02-12 LAB — LACTATE DEHYDROGENASE
Lactate dehydrogenase:CCnc:Pt:Ser/Plas:Qn:Reaction: pyruvate to lactate: 1432 — ABNORMAL HIGH
Lactate dehydrogenase:CCnc:Pt:Ser/Plas:Qn:Reaction: pyruvate to lactate: 1477 — ABNORMAL HIGH

## 2019-02-12 LAB — SLIDE REVIEW

## 2019-02-12 LAB — PH VENOUS: pH:LsCnc:Pt:BldV:Qn:: 7.3 — ABNORMAL LOW

## 2019-02-12 LAB — O2 SATURATION VENOUS: Oxygen saturation:MFr:Pt:BldV:Qn:: 39.1 — ABNORMAL LOW

## 2019-02-12 LAB — HCO3 VENOUS: Bicarbonate:SCnc:Pt:BldA:Qn:: 37 — ABNORMAL HIGH

## 2019-02-12 LAB — BURR CELLS

## 2019-02-12 LAB — EGFR CKD-EPI AA MALE: Lab: >90

## 2019-02-12 LAB — MAGNESIUM: Magnesium:MCnc:Pt:Ser/Plas:Qn:: 1.6

## 2019-02-12 MED ORDER — AZACITIDINE CHEMO INJECTION 100 MG
75.00 | INTRAMUSCULAR | Status: DC
Start: 2019-02-17 — End: 2019-02-12

## 2019-02-12 MED ORDER — ONDANSETRON HCL 8 MG PO TABS
16.00 | ORAL_TABLET | ORAL | Status: DC
Start: 2019-02-12 — End: 2019-02-12

## 2019-02-12 MED ORDER — ONDANSETRON HCL 4 MG/2ML IJ SOLN
8.00 | INTRAMUSCULAR | Status: DC
Start: ? — End: 2019-02-12

## 2019-02-12 MED ORDER — IPRATROPIUM-ALBUTEROL 0.5-2.5 (3) MG/3ML IN SOLN
3.00 | RESPIRATORY_TRACT | Status: DC
Start: 2019-02-13 — End: 2019-02-12

## 2019-02-12 MED ORDER — FUROSEMIDE 40 MG PO TABS
40.00 | ORAL_TABLET | ORAL | Status: DC
Start: 2019-02-18 — End: 2019-02-12

## 2019-02-12 MED ORDER — METHYLPREDNISOLONE SODIUM SUCC 125 MG IJ SOLR
125.00 | INTRAMUSCULAR | Status: DC
Start: ? — End: 2019-02-12

## 2019-02-12 MED ORDER — GENERIC EXTERNAL MEDICATION
Status: DC
Start: ? — End: 2019-02-12

## 2019-02-12 MED ORDER — SODIUM CHLORIDE 0.9 % IV SOLN
1000.00 | INTRAVENOUS | Status: DC
Start: ? — End: 2019-02-12

## 2019-02-12 MED ORDER — AZACITIDINE CHEMO INJECTION 100 MG
75.00 | INTRAMUSCULAR | Status: DC
Start: 2019-02-12 — End: 2019-02-12

## 2019-02-12 MED ORDER — SODIUM CHLORIDE 0.9 % IV SOLN
500.00 | INTRAVENOUS | Status: DC
Start: 2019-02-13 — End: 2019-02-12

## 2019-02-12 MED ORDER — VENETOCLAX 100 MG PO TABS
200.00 | ORAL_TABLET | ORAL | Status: DC
Start: 2019-02-12 — End: 2019-02-12

## 2019-02-12 MED ORDER — FAMOTIDINE 20 MG/2ML IV SOLN
20.00 | INTRAVENOUS | Status: DC
Start: ? — End: 2019-02-12

## 2019-02-12 MED ORDER — DIPHENHYDRAMINE HCL 50 MG/ML IJ SOLN
25.00 | INTRAMUSCULAR | Status: DC
Start: ? — End: 2019-02-12

## 2019-02-12 MED ORDER — VENETOCLAX 100 MG PO TABS
400.00 | ORAL_TABLET | ORAL | Status: DC
Start: 2019-02-17 — End: 2019-02-12

## 2019-02-12 MED ORDER — EPINEPHRINE 0.3 MG/0.3ML IJ SOAJ
0.30 | INTRAMUSCULAR | Status: DC
Start: ? — End: 2019-02-12

## 2019-02-12 MED ORDER — CETIRIZINE HCL 10 MG PO TABS
10.00 | ORAL_TABLET | ORAL | Status: DC
Start: 2019-02-18 — End: 2019-02-12

## 2019-02-12 MED ORDER — ONDANSETRON HCL 8 MG PO TABS
16.00 | ORAL_TABLET | ORAL | Status: DC
Start: 2019-02-17 — End: 2019-02-12

## 2019-02-12 MED ORDER — SODIUM CHLORIDE 0.9 % IV SOLN
20.00 | INTRAVENOUS | Status: DC
Start: ? — End: 2019-02-12

## 2019-02-12 MED ORDER — MEPERIDINE HCL 25 MG/ML IJ SOLN
25.00 | INTRAMUSCULAR | Status: DC
Start: ? — End: 2019-02-12

## 2019-02-12 NOTE — Unmapped (Signed)
Pt weaned back to nasal cannula since he kept taking cpap off.  Sats 98-100% on 2L [place]. Pt reminded to keep nasal cannula on.

## 2019-02-12 NOTE — Unmapped (Signed)
Pt A&O. Pt admitted for cycle 1 azacitidine/venetoclax. Pt compliant with taking his medications. Still refusing covid swab. Pt getting irritated with this nurse while asking admission questions, stating he wants to be left alone. While NA getting midnight VS, pt's O2 sats dropping to 70s. Placed on 2L [place], sats came up to 91%. Pt also placed on continuous pulse ox. RRT nurse attempting to draw ABG, but pt refusing, VBG done. Lasix 20mg  IV given. Pt again dropping to 70-80s on 2L. Stefan Church, NP notified and at bedside. Kat Schwart, RT notified and attempted to draw ABG, but pt would not be still and yelling. Pt placed on venturi mask at 31% FIO2 sats 93%. Pt then placed on CPAP by resp therapist. Sats 96-100% on cpap. Several phone calls from telemetry regarding low sats in 70s, then pt is found to have taken cpap off. Pt reminded again to keep cpap on. Will continue to monitor.    Problem: Adult Inpatient Plan of Care  Goal: Plan of Care Review  Outcome: Ongoing - Unchanged  Goal: Patient-Specific Goal (Individualization)  Outcome: Ongoing - Unchanged  Goal: Absence of Hospital-Acquired Illness or Injury  Outcome: Ongoing - Unchanged  Goal: Optimal Comfort and Wellbeing  Outcome: Ongoing - Unchanged  Goal: Readiness for Transition of Care  Outcome: Ongoing - Unchanged  Goal: Rounds/Family Conference  Outcome: Ongoing - Unchanged     Problem: Infection  Goal: Infection Symptom Resolution  Outcome: Ongoing - Unchanged     Problem: COPD Comorbidity  Goal: Maintenance of COPD Symptom Control  Outcome: Ongoing - Unchanged

## 2019-02-12 NOTE — Unmapped (Signed)
Daily Progress Note        Principal Problem:    Acute myeloid leukemia not having achieved remission (CMS-HCC)  Active Problems:    CAD (coronary artery disease)    Hyperlipidemia    Hypertension    Arthritis    Asthma    Diabetes mellitus (CMS-HCC)    Other psoriasis       LOS: 1 day     Assessment/Plan: Raymond [name] is a 63 y.o. male with newly diagnosed AML and PMHx of NSTEMI/CAD s/t stents, DMT2, HLD, HTN, COPD, depression/anxiety, mild cognitive impairment , who is admitted for initiation of tx with Azacitidine and Venetoclax.    Plan Summary: C1D2=02/12/19 Aza/Ven. Plan to keep over the weekend given complicated disposition. Patient has lifelong intellectual disability and will be relocating to live with his sister Raymond [name] (in [place]) upon discharge (her number is [phone]). Touched base with Raymond [name], he has been referred to [facility] but has yet to get established (has appt w/Raymond [name] on 11/6 at 1pm, will need to be seen prior to being allowed to have labs/support scheduled there). Planning to discharge Monday 10/12 (Day 7) after last Aza injection (scheduled for 10:30am), he has labs and follow up w/Raymond [name] 10/15 and labs with appt w/Raymond [name] (D15) on 10/19. CM to start looking into [facility] in [place] area for medication management (admitted w/~50+ medications, Rx is reconciling) (spoke w/Raymond [name] today and she is agreeable). Also consulted Psych today given his refusals and questionable understanding of care (he is refusing many procedures including blood and COVID swab), to see tomorrow. Per Rx no need for PAP assistance, all medications including Posa has been approved through his insurance. He had a hypoxic episode ON, 79% on room air, hx COPD, wheezing, given Neb and Lasix 20 mg IVP, placed on CPAP (hx OSA, uses CPAP at home), VBG revealed Ph 7.3/Co2 73, no baseline, 02 improved to upper 90's w/CPAP. Nebs scheduled QID, continue Symbicort, repeat VBG this afternoon (pending), CXR showed some LLL atelectasis (IS ordered), Lasix 40 mg daily scheduled added, daily weight, monitor daily. CPAP HS is able to tolerate otherwise [place] 02 ok. Of note had recently been placed on home 02 (2L) per his oncologist in [place] (Raymond [name] in [place]) which Raymond [name] stated he wears all the time. 02 goal 88-92% placed. Will need to pass along in CAP rounds tomorrow to ensure home 02/CPAP set up upon discharge in [place]. Also has facial red scabbed areas, patchy, states are itchy at times, no drainage (hx psorarsis), cetirizine daily added. CTM.      *02/12/19 APP spoke w/patients sister Raymond [name] and updated her    AML, with MRC, complex karyotype, TP53 mutation: Presented to PCP with abd pain and fatigue, labs revealed pancytopenia. Diagnostic BMBx confirmed AML with TP53 mutation.  Multiple tx regimens were discussed and decided to proceed with Azacitidine and Venetoclax. Plan for BMBx s/p C1.  - C1D1=10/6   - Azacitidine 75 mg/m2 SubQ daily, D1-7 inpatient (chemo orders adjusted, chemo clarification placed)  - Venetoclax PO daily: 100 mg D1, 200 mg D2, 400 mg D3-7, then 100 mg D8 and forward (d/t posaconazole start)  - Ppx: Valtrex and Levaquin ppx. Plan to start Posaconazole D8  - Allopurinol 300 daily  - IVF boluses per tx plan  [ ]  CBC daily, TLS labs Q12H  - Transfuse RBCs if hemoglobin < 7, platelets if < 10K - MANUAL transfusions  [ ]  f/u admission COVID screen (patient has  refused numerous times despite education and risks/benefits discussion)  ??  Disposition  - 10/15 Labs at 2:30pm followed by 3:30pm appt w/Raymond [name]  - 10/19 Labs 12pm followed by appt w/Raymond [name] at 1 pm (D15 appointment)  - Requested labs with transfusion support 2x/weekly starting 10/19 (careful not to duplicate appointments), awaiting scheduling   - Need to ensure [facility] medication support and home 02/CPAP set up prior to discharge    COPD: Reported chronic condition. Uses Symbicort and albuterol inhalers intermittently. Reports recent addition of home O2 due to low O2 level, not needing on admission.   - CXR on admission with generally clear lungs with possible mild edema  - Continue Symbicort (or formulary alternative)  - 10/7 overnight had hypoxic episode ON to 79% on room air, was given neb and Lasix 20 mg IVP and placed on CPAP, improved. Of note recently placed on 2 L home 02 per local oncologist (Dr. Dr. Rickard [name] in [place]) that he is supposed to wear all the time (per pt and pts sister)  - O2 88-92% goal ordered, nursing instructed to titrate 02 as able (currently on 2L)  - Albuterol nebs QID  - Lasix 40 mg Daily added  - daily weights, strict I/O ordered  [ ]  Admission COVID screen (patient continues to refuse)  - Will need to ensure home 02 and CPAP set up in [place] prior to discharge    Possible GIB: Months long hx of abd pain and dark stools, likely confounded by AML diagnosis and pancytopenia, requiring intermittent blood product transfusions. Pt has also noted BRBPR recently, and remote hx of hemorrhoids. Home med list includes protonix, ranitidine, bentyl, carafate. Follows with GI locally?  - KUB on admission with nonobstructive bowel pattern and moderate stool burden  - Monitor for melana/BRBPR  - Per attending hold off on FOBT  - Transfuse blood/platelets as needed  - Continue Protonix and Carafate  - Low threshold to consult GI should melana/BRBPR occur  - Bowel regimen PRN    Hx of Psorasis: Patient has BUE psoriasis changes noted as well as bilteral facial scab areas that he notes are itchy at times, suspect patient is picking at his face.   - Cetirizine daily added  - Eucerin cream added  - No drainage, erythema or other c/f infection noted  - CTM    CAD, HTN, HLD, s/p STEMI (2010), with stent placement (2010, 2017): Chronic conditions, follows with [facility]ology?. Reportedly, most recent cardiac cath in 2019 revealed normal LV function, and known stents. Pt uncertain of current medication regimen, but home med list includes: CCB, ACE, diuretics, BB, vasodilators and nitrates, statin. States he has been out of many meds recently and is unsure what he has been taking. States Plavix and Aspirin have been stopped since he was directed to stop these by Raymond [name] at recent outpatient visit. EKG on admission was NSR. He has not needed NTG tabs lately.   - Continue home Amlodipine  - Continue home Metoprolol  - Can titrate above meds as needed  - HOLD home ACE, diuretics, and statin d/t TLS risk and DDIs with chemo  - HOLD Plavix/ASA until thrombocytopenia resolution  - NTG PRN  ??  DMT2: Chronic condition. Home med list included Metformin.  - Lispro SSI + Accuchecks 4x/daily  ??  Pain: Describes as arthritic pain in knees and hands, intermittent frontal headaches. States he needs surgery on his right knee.  - Tramadol 25-50 mg for mod-severe pain per pain  scale  - Voltaren gel to knees scheduled 4x/daily  - Tylenol PRN  ??  Anxiety depression: chronic condition. Home med list included Celexa, Cymbalta  - Continue home Celexa  - QTcF on admission 403  ??  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 Recently living at home alone, however planning to move in with sister after hospital d/c, for support while undergoing tx. WIll need local f/u arranged at [facility].   - Pharmacy to review home meds to consolidate prior to d/c  - Psych consulted given patients frequent refusal of care, to see tomorrow  - [facility] for medication management requested in CAPP rounds today  ??  DVT Ppx: none d/t thrombocytopenia, encourage ambulation  FEN:  - IV fluids per chemo orders  - Replete electrolytes as needed, monitoring for TLS  - Regular diet  ??  Need for PT: No  Anticipated Discharge Date: 10/12  ??  Code Status: Full code  ??  Subjective:     Interval History: Patient is upset that he has to stay in the hospital, became tearful. After speaking with family he is agreeable. Updated sister, Raymond [name], on phone today. Still refusing care at times (blood, covid test), able to get blood after discussion/reassurance. Denies any fever, chills. Denies any cough or SOB. Stated he had a BM ON and it did not look dark/BRBPR noted (though patient is not best historian). Denies abdominal pain today, appetite stable.     10 point ROS otherwise negative except as above in the HPI.     Objective:     Vital signs in last 24 hours:  Temp:  [36.4 ??C (97.5 ??F)-36.8 ??C (98.2 ??F)] 36.7 ??C (98.1 ??F)  Heart Rate:  [74-89] 83  Resp:  [16-20] 20  BP: (107-124)/(41-68) 109/51  MAP (mmHg):  [61-70] 64  FiO2 (%):  [31 %-32 %] 32 %  SpO2:  [79 %-100 %] 97 %  BMI (Calculated):  [33.56] 33.56    Intake/Output last 3 shifts:  I/O last 3 completed shifts:  In: 500 [I.V.:500]  Out: 1050 [Urine:1050]    Meds:  Current Facility-Administered Medications   Medication Dose Route Frequency Provider Last Rate Last Dose   ??? acetaminophen (TYLENOL) tablet 650 mg  650 mg Oral Q6H PRN [facility], AGNP   650 mg at 02/12/19 1229   ??? albuterol 2.5 mg /3 mL (0.083 %) nebulizer solution 2.5 mg  2.5 mg Nebulization Q6H PRN [facility], AGNP   2.5 mg at 02/12/19 0202   ??? allopurinoL (ZYLOPRIM) tablet 300 mg  300 mg Oral Daily [address], AGNP   300 mg at 02/12/19 0900   ??? amLODIPine (NORVASC) tablet 5 mg  5 mg Oral Daily [facility], AGNP   5 mg at 02/12/19 9604   ??? azaCITIDine (VIDAZA) syringe 164.25 mg  75 mg/m2 (Order-Specific) Subcutaneous Q24H Avie Arenas, MD   164.25 mg at 02/11/19 2158   ??? cetirizine (ZyrTEC) tablet 10 mg  10 mg Oral Daily Gabriela Sarita Clover Mealy, NP       ??? citalopram (CeleXA) tablet 20 mg  20 mg Oral Daily [address] AGNP   20 mg at 02/12/19 0859   ??? dextrose 50 % in water (D50W) 50 % solution 12.5 g  12.5 g Intravenous Q10 Min PRN [address] [place], AGNP       ??? diclofenac sodium (VOLTAREN) 1 % gel 2 g  2 g Topical 4x  Daily [address] [place], [place]   2 g at 02/12/19 1203   ??? diphenhydrAMINE (BENADRYL) injection 25 mg  25 mg Intravenous Q4H PRN Avie Arenas, MD       ??? docusate sodium (COLACE) capsule 100 mg  100 mg Oral BID PRN Ernie Hew, AGNP       ??? EPINEPHrine (EPIPEN) injection 0.3 mg  0.3 mg Intramuscular Daily PRN Avie Arenas, MD       ??? famotidine (PF) (PEPCID) injection 20 mg  20 mg Intravenous Q4H PRN Avie Arenas, MD       ??? fluticasone furoate-vilanteroL (BREO ELLIPTA) 200-25 mcg/dose inhaler 1 puff  1 puff Inhalation Daily (RT) Army Chaco Vogler, AGNP   1 puff at 02/12/19 0859   ??? furosemide (LASIX) tablet 40 mg  40 mg Oral Daily Bejal Marya Fossa, FNP   40 mg at 02/12/19 1200   ??? insulin lispro (HumaLOG) injection 0-12 Units  0-12 Units Subcutaneous ACHS [address] [place], [place]       ??? IP OKAY TO TREAT   Other Continuous PRN Army Chaco Vogler, AGNP       ??? ipratropium-albuteroL (DUO-NEB) 0.5-2.5 mg/3 mL nebulizer solution 3 mL  3 mL Nebulization Q6H (RT) Bejal Marya Fossa, FNP   3 mL at 02/12/19 1200   ??? levoFLOXacin (LEVAQUIN) tablet 500 mg  500 mg Oral Daily [address] AGNP   500 mg at 02/12/19 0900   ??? loperamide (IMODIUM) capsule 2 mg  2 mg Oral Q2H PRN Ernie Hew, AGNP       ??? loperamide (IMODIUM) capsule 4 mg  4 mg Oral Once PRN Ernie Hew, AGNP       ??? meperidine (DEMEROL) injection 25 mg  25 mg Intravenous Q30 Min PRN Avie Arenas, MD       ??? methylPREDNISolone sodium succinate (PF) (Solu-MEDROL) injection 125 mg  125 mg Intravenous Q4H PRN Avie Arenas, MD       ??? metoprolol succinate (TOPROL-XL) 24 hr tablet 25 mg  25 mg Oral Daily [address], AGNP   25 mg at 02/11/19 1955   ??? nitroglycerin (NITROSTAT) SL tablet 0.4 mg  0.4 mg Sublingual Q5 Min PRN [address] [place], AGNP       ??? ondansetron (ZOFRAN) injection 8 mg  8 mg Intravenous Once PRN Avie Arenas, MD       ??? ondansetron [facility]) tablet 16 mg  16 mg Oral Q24H Avie Arenas, MD   16 mg at 02/11/19 2108   ??? pantoprazole (PROTONIX) EC tablet 40 mg  40 mg Oral Daily [address], AGNP   40 mg at 02/12/19 0900   ??? polyethylene glycol (MIRALAX) packet 17 g  17 g Oral BID PRN Ernie Hew, AGNP       ??? senna (SENOKOT) tablet 2 tablet  2 tablet Oral Nightly PRN Ernie Hew, AGNP       ??? sodium chloride (NS) 0.9 % infusion  20 mL/hr Intravenous Continuous PRN Avie Arenas, MD       ??? sodium chloride 0.9% (NS) bolus 1,000 mL  1,000 mL Intravenous Daily PRN Avie Arenas, MD       ??? sodium chloride 0.9% (NS) bolus 500 mL  500 mL Intravenous Q24H Avie Arenas, MD 250 mL/hr at 02/11/19 1932 500 mL at 02/11/19 1932   ??? sucralfate (CARAFATE) oral suspension  1 g Oral ACHS [address] [place],  AGNP   1 g at 02/12/19 1200   ??? traMADoL (ULTRAM) tablet 25 mg  25 mg Oral Q6H PRN [facility], AGNP        Or   ??? traMADoL (ULTRAM) tablet 50 mg  50 mg Oral Q6H PRN [facility], AGNP   50 mg at 02/11/19 1731   ??? valACYclovir (VALTREX) tablet 500 mg  500 mg Oral Daily [address] [place], AGNP   500 mg at 02/12/19 8469   ??? venetoclax (VENCLEXTA) tablet 200 mg  200 mg Oral Daily Avie Arenas, MD       ??? [START ON 02/13/2019] venetoclax (VENCLEXTA) tablet 400 mg  400 mg Oral Daily Avie Arenas, MD           Physical Exam:  General: Resting, in no apparent distress, lying in bed  HEENT:  PERRL. No scleral icterus or conjunctival injection. MMM without ulceration, erythema or exudate. No cervical or axillary lymphadenopathy.   Heart:  RRR. S1, S2. No murmurs, gallops, or rubs.  Lungs:  Breathing is unlabored, and patient is speaking full sentences with ease. Faint wheezing throughout, diminished air movement  Abdomen:  No distention or pain on palpation.  Bowel sounds are present and normoactive x 4.  No palpable hepatomegaly or splenomegaly.  No palpable masses.  Skin: Warm to touch, dry. Facial scabbed areas bilaterally, no drainage, itchy at times per patient, psoriasis to BUE  Musculoskeletal:  No grossly-evident joint effusions or deformities.  Range of motion about the shoulder, elbow, hips and knees is grossly normal.  Psychiatric:  Range of affect is appropriate.    Neurologic:  Alert and oriented to person, place, time and situation.  Gait is normal.  No Nystagmus Cerebellar tasks (finger-to-nose, rapid hand movement, heel along shin) are completed with ease and are symmetric. CNII-CNXII grossly intact.  Extremities:  Appear well-perfused. No clubbing, edema, or cyanosis.  CVAD: R CW Port - no erythema, nontender; dressing CDI.      Labs:  Recent Labs     02/10/19  1333 02/11/19  1842 02/12/19  0555   WBC 3.0* 2.5* 2.4*   NEUTROABS 1.0* 0.8* 0.9*   LYMPHSABS 1.5 1.2* 1.2*   HGB 9.2* 7.9* 7.0*   HCT 27.9* 24.3* 21.7*   PLT 22* 21* 20*   CREATININE 0.58* 0.67* 0.61*   BUN 13 17 14    BILITOT 1.0  --  0.5   AST 28  --  28   ALT 13  --  11   ALKPHOS 65  --  53   K 3.7 4.1 3.6   MG 1.7 1.5* 1.6   CALCIUM 8.4* 8.3* 7.8*   NA 140 135 133*   CL 97* 97* 94*   CO2 35.0* 32.0* 31.0*   PHOS 3.7 5.2* 5.6*       Imaging:  CXR reviewed per EMR    Julieanne Cotton, ACNP-BC  02/12/2019  1:54 PM   [name] Practitioner  Hematology/Oncology Department   [facility]   Group pager: [phone] or [phone]

## 2019-02-12 NOTE — Unmapped (Signed)
Pt refusing ABG lab draw. Hospitalist V. Walters at bedside. Pt finished breathing treatment and states he feels better. Placed on continuous pulse ox; sats 100% on 2L nasal cannula.

## 2019-02-12 NOTE — Unmapped (Signed)
Care Management  Initial Transition Planning Assessment              General  Care Manager assessed the patient by : In person interview with patient, Medical record review, Discussion with Clinical Care team  Orientation Level: Oriented X4   CM met with patient in pt room.  Pt/visitors were not wearing hospital provided masks for the duration of the interaction with CM.   CM was wearing hospital provided surgical mask and hospital provided eye protection.  CM was not within 6 foot of the patient/visitors during this interaction.       Contact/Decision Maker  Extended Emergency Contact Information  Primary Emergency Contact: Abernathy,Joyce  Address: [address], [place] [address] [place]  Home Phone: [phone]  Mobile Phone: [phone]  Relation: Sister  Secondary Emergency Contact: Lanning,Connie  Mobile Phone: [phone]  Relation: Sister    Legal Next of Kin / Guardian / POA / Advance Directives       Advance Directive (Medical Treatment)  Does patient have an advance directive covering medical treatment?: Patient does not have advance directive covering medical treatment.  Reason patient does not have an advance directive covering medical treatment:: Patient does not wish to complete one at this time.    Health Care Decision Maker [HCDM] (Medical & Mental Health Treatment)  Healthcare Decision Maker: Patient does not wish to appoint a Health Care Decision Maker at this time  Information offered on HCDM, Medical & Mental Health advance directives:: Patient declined information.         Patient Information  Lives with: Alone, Other (Comment)    Type of Residence: Private residence       Type of Residence: Mailing Address:  [address]  [place] [address]  Contacts: Accompanied by: Alone  Password: none given  Patient Phone Number:   Telephone Information:   Mobile [phone]             Medical Provider(s): Donnald Garre, MD  Reason for Admission: Admitting Diagnosis:  scheduled chemo  Past Medical History:   has a past medical history of Angina pectoris (CMS-HCC), Anxiety, Arthritis, CAD (coronary artery disease), Depression, Dyslipidemia, HL (hearing loss), Hyperlipidemia, Hypertension, Medically noncompliant, Mental retardation, MI (myocardial infarction) (CMS-HCC), Migraines, Sleep apnea, and Thrombocytopenia (CMS-HCC) (01/20/2019).  Past Surgical History:   has a past surgical history that includes Cholecystectomy; Appendectomy; Cardiac catheterization; Coronary stent placement; Tonsillectomy; pr knee scope,single menisectomy (Right, 04/21/2015); pr knee scope,part synovect (Right, 04/21/2015); Hemorrhoid surgery; pr cath place/coron angio, img super/interp,w left heart ventriculography (N/A, 02/25/2016); pr xcapsl ctrc rmvl insj io lens prosth w/o ecp (Right, 03/12/2017); pr xcapsl ctrc rmvl insj io lens prosth w/o ecp (Left, 05/28/2017); and Skin biopsy.   Previous admit date: N/A    Primary Insurance- Payor: HUMANA MEDICARE ADV / Plan: HUMANA GOLD PLUS HMO / Product Type: *No Product type* /   Secondary Insurance ??? Secondary Insurance  MEDICAID [place]  Prescription Coverage ??? medicare  Preferred Pharmacy - [facility] - [place]), [place] - [address]  [facility] - [name], [place] - [address]  [facility] [place] - [address]  [facility] [place] - [address]  [facility] [place], [place] - [address]  [facility]    Transportation home:  Private vehicle  Level of function prior to admission: Independent             Support Systems: Family Members    Responsibilities/Dependents at home?: No    Home Care services in place prior to admission?: No                  Equipment Currently Used at Home: oxygen  Current HME Agency (Name/Phone #): pt does not recall agency name that provides O2, we called his sister [name] and she did not know either    Currently receiving outpatient dialysis?: No       Financial Information       Need for financial assistance?: No       Social Determinants of Health    ??? Food insecurity     Worry: Never true     Inability: Never true     Discharge Needs Assessment  Concerns to be Addressed: denies needs/concerns at this time    Clinical Risk Factors: Principal Diagnosis: Cancer, Stroke, COPD, Heart Failure, AMI, Pneumonia, Joint Replacment    Barriers to taking medications: No    Prior overnight hospital stay or ED visit in last 90 days: No    Readmission Within the Last 30 Days: no previous admission in last 30 days         Anticipated Changes Related to Illness: none    Equipment Needed After Discharge: none    Discharge Facility/Level of Care Needs: other (see comments)(will d/c to sister's home (Con[place]) in [place] area)    Readmission  Risk of Unplanned Readmission Score: UNPLANNED READMISSION SCORE: 23%  Predictive Model Details           23% (High) Factors Contributing to Score   Calculated 02/12/2019 16:18 38% Number of active Rx orders is 64   [place] Risk of Unplanned Readmission Model 9% Diagnosis of cancer is present     8% ECG/EKG order is present in last 6 months     8% Latest calcium is low (7.8 mg/dL)     6% Imaging order is present in last 6 months     5% Latest hemoglobin is low (7.0 g/dL)     5% Phosphorous result is present     5% Charlson Comorbidity Index is 5     Readmitted Within the Last 30 Days? (No if blank)   Patient at risk for readmission?: Yes    Discharge Plan  Screen findings are: Care Manager reviewed the plan of the patient's care with the Multidisciplinary Team. No discharge planning needs identified at this time. Care Manager will continue to manage plan and monitor patient's progress with the team.    Expected Discharge Date: 02/17/2019      Patient and/or family were provided with choice of facilities / services that are available and appropriate to meet post hospital care needs?: N/A       Initial Assessment complete?: Yes      Pt will d/c to sister Connie's home:  [address] [place],   [place] [address]    She will pick pt up at d/c, go to [facility] to pick up pt belongings and O2 and then go to her home.  Follow up will be arranged at [facility].

## 2019-02-12 NOTE — Unmapped (Signed)
Hematology/Oncology APP H&P    Admission Date: 02/11/2019  Admitting Attending: Painschab  Outpatient Primary Oncologist: Zeidner    Reason for Admission: Cycle 1 Azacitidine/Venetoclax for tx of AML    HPI: Key Cen is a 63 y.o. male with newly diagnosed AML and PMHx of NSTEMI/CAD s/t stents, DMT2, HLD, HTN, COPD, depression/anxiety, mild cognitive impairment , who is admitted for initiation of tx with Azacitidine and Venetoclax.    Mr. Eustice presented to his PCP with fatigue, abdominal pain and was found to be pancytopenic. W/u revealed AML, with MDS related changes, TP53 mutation, complex cytogenetics. Multiple treatment regimens were discussed outpatient with the pt, pt's sister, and Dr. Vertell Limber, and ultimately he is being admitted for initiation of tx with Aza/Ven.     He continues to be fatigued, and notes abdominal pain present for several months.  This was being worked up by GI, but GI procedures have been put on hold given current pancytopenia. He denies N/V/D/C, and has been able to eat regularly, but does have some early satiety. In addition, he notes stools have been dark/black, and he has noticed scant blood while wiping. LBM yesterday. He states he has a hx of hemorrhoids which have required intervention in the past. In addition, he has had worsening of his arthritis pain, mainly in his bilateral knees, and left hand. He has also had intermittent frontal headaches over the past couple of weeks, occasionally feels like someone is hammering in my head.  Neither pain has been particularly responsive to recently prescribed Tramadol. He has BLE edema that waxes and wanes, recent addition of Lasix to med list has not helped and he has not taken it reliably. He does note recent nosebleeds and cuts are taking longer to clot, and have required pressure dressings. He was prescribed oxygen at a recent visit to [facility], for a low O2 sat. States he uses 2L, but of note is not on O2 today in infusion center.    Otherwise, he denies night sweats or unexplained fevers,  CP, SOB, persistent cough, persistent dyspnea on exertion, palpitations or chest pain.  There have been no new or unexplained pains or self-identified masses, swelling or enlarged lymph nodes. He has a hx of psoriasis on his elbows, which also waxes and wanes. He has had no known COVID contacts; has trouble appropriately wearing a mask despite prompting from sister.      Pt is a poor historian r/t medication list.      Review of Systems: A review of systems was performed. Pertinent positives are listed above, all other systems are negative.    Oncologic History:   Primary Oncologist: Zeidner  Oncology History Overview Note   Diagnosis: AML with MDS-Related Changes    Presentation: Pancytopenia    Final Diagnosis   Date Value Ref Range Status   01/15/2019   Final    (Outside Case #:  [id number], dated 01/15/2019)  Bone marrow,  aspiration and biopsy  -  Hypercellular bone marrow (70%) involved by acute myeloid leukemia (30% blasts by manual aspirate differential) (See Comment)        This electronic signature is attestation that the pathologist personally reviewed the submitted material(s) and the final diagnosis reflects that evaluation.          Cytogenetics: Complex/Monosomal Karyotype with Del(5q), -17    Flow cytometry: CD7+, CD33+, CD34+, CD38+, CD117+, CD123+, HLA-DR+     Acute myeloid leukemia not having achieved remission (CMS-HCC)   01/15/2019 Biopsy  Final Diagnosis   Date Value Ref Range Status   01/15/2019   Final    (Outside Case #:  [id number], dated 01/15/2019)  Bone marrow,  aspiration and biopsy  -  Hypercellular bone marrow (70%) involved by acute myeloid leukemia (30% blasts by manual aspirate differential) (See Comment)        This electronic signature is attestation that the pathologist personally reviewed the submitted material(s) and the final diagnosis reflects that evaluation.          Cytogenetics: Complex Karyotype with Del(5q), -17     01/23/2019 Initial Diagnosis    Acute myeloid leukemia not having achieved remission (CMS-HCC)     02/11/2019 -  Chemotherapy    OP AML AZACITIDINE + VENETOCLAX  azacitidine 75 mg/m2 SQ on days 1-7, venetoclax ramp up week 1; dose dependent, then azacitidine 75 mg/m2 SQ on days 1-7 every 28 days.         Medical History:  PCP: Donnald Garre, MD  Past Medical History:   Diagnosis Date   ??? Angina pectoris (CMS-HCC)    ??? Anxiety    ??? Arthritis     bilateral knees   ??? CAD (coronary artery disease)    ??? Depression    ??? Dyslipidemia    ??? HL (hearing loss)    ??? Hyperlipidemia    ??? Hypertension    ??? Medically noncompliant    ??? Mental retardation    ??? MI (myocardial infarction) (CMS-HCC)    ??? Migraines    ??? Sleep apnea     USES CPAP OCCASIONALLY   ??? Thrombocytopenia (CMS-HCC) 01/20/2019    Surgical History:  Past Surgical History:   Procedure Laterality Date   ??? APPENDECTOMY     ??? CARDIAC CATHETERIZATION     ??? CHOLECYSTECTOMY     ??? CORONARY STENT PLACEMENT     ??? HEMORRHOID SURGERY     ??? PR CATH PLACE/CORON ANGIO, IMG SUPER/INTERP,W LEFT HEART VENTRICULOGRAPHY N/A 02/25/2016    Procedure: Left Heart Catheterization W Intervention;  Surgeon: Job Founds, MD;  Location: [facility] CATH;  Service: Cardiology   ??? PR KNEE SCOPE,PART SYNOVECT Right 04/21/2015    Procedure: ARTHROSCOPY, KNEE, SURGICAL; SYNOVECTOMY LIMITED (EG, PLICA OR SHELF RESECTION) (SEPARATE PROCEDURE);  Surgeon: Annice Needy, MD;  [facility];  Service: Orthopedics   ??? PR KNEE SCOPE,SINGLE MENISECTOMY Right 04/21/2015    Procedure: ARTHROSCOPY, KNEE; W/MENISECTMY/CHONDROPLASTY/SYNOVECTOMY;  Surgeon: Annice Needy, MD;  Location: [name];  Service: Orthopedics   ??? PR XCAPSL CTRC RMVL INSJ IO LENS PROSTH W/O ECP Right 03/12/2017    Procedure: CATARACT RIGHT EYE;  Surgeon: Gwynneth Aliment, MD;  [name];  Service: Ophthalmology   ??? PR XCAPSL CTRC RMVL INSJ IO LENS PROSTH W/O ECP Left 05/28/2017    Procedure: CATARACT LEFT EYE;  Surgeon: Gwynneth Aliment, MD;  [name];  Service: Ophthalmology   ??? SKIN BIOPSY     ??? TONSILLECTOMY        Social History:  Social History     Socioeconomic History   ??? Marital status: Single     Spouse name: Not on file   ??? Number of children: Not on file   ??? Years of education: Not on file   ??? Highest education level: Not on file   Occupational History   ??? Not on file   Social Needs   ??? Financial resource strain: Not on file   ???  Food insecurity     Worry: Not on file     Inability: Not on file   ??? Transportation needs     Medical: Not on file     Non-medical: Not on file   Tobacco Use   ??? Smoking status: Former Smoker     Packs/day: 1.00     Years: 5.00     Pack years: 5.00     Types: Cigarettes     Quit date: 04/11/2014     Years since quitting: 4.8   ??? Smokeless tobacco: Never Used   Substance and Sexual Activity   ??? Alcohol use: No     Alcohol/week: 0.0 standard drinks   ??? Drug use: No   ??? Sexual activity: Not Currently     Partners: Female   Lifestyle   ??? Physical activity     Days per week: Not on file     Minutes per session: Not on file   ??? Stress: Not on file   Relationships   ??? Social Wellsite geologist on phone: Not on file     Gets together: Not on file     Attends religious service: Not on file     Active member of club or organization: Not on file     Attends meetings of clubs or organizations: Not on file     Relationship status: Not on file   Other Topics Concern   ??? Do you use sunscreen? No   ??? Tanning bed use? No   ??? Are you easily burned? No   ??? Excessive sun exposure? No   ??? Blistering sunburns? No   Social History Narrative   ??? Not on file       Living situation: the patient lives alone, planning to live with sister after start of tx    ECOG Performance Status: 2 - Ambulatory and capable of all selfcare but unable to carry out any work activities. Up and about more than 50% of waking hours       Unless noted below, no pertinent family history to this hospitalization.  Family History   Problem Relation Age of Onset   ??? Heart attack Father    ??? Melanoma Neg Hx    ??? Basal cell carcinoma Neg Hx    ??? Squamous cell carcinoma Neg Hx         Allergies: has No Known Allergies.    Medications:  No current facility-administered medications on file prior to encounter.      Current Outpatient Medications on File Prior to Encounter   Medication Sig Dispense Refill   ??? acetaminophen (TYLENOL) 650 MG CR tablet Take 1 tablet (650 mg total) by mouth every eight (8) hours as needed for pain. 30 tablet 0   ??? albuterol (PROVENTIL HFA;VENTOLIN HFA) 90 mcg/actuation inhaler Inhale 2 puffs every four (4) hours as needed.     ??? allopurinoL (ZYLOPRIM) 300 MG tablet Take 1 tablet (300 mg total) by mouth daily. 30 tablet 0   ??? amLODIPine (NORVASC) 5 MG tablet TAKE ONE TABLET BY MOUTH EVERY DAY 30 tablet 2   ??? atorvastatin (LIPITOR) 20 MG tablet Take 20 mg by mouth daily.     ??? azelastine (ASTELIN) 137 mcg (0.1 %) nasal spray 1 spray by Each Nare route Two (2) times a day. Use in each nostril as directed 1.2 mL 0   ??? benazepril (LOTENSIN) 20 MG tablet Take 20 mg by mouth daily.     ???  citalopram (CELEXA) 20 MG tablet Take 20 mg by mouth daily.     ??? clobetasoL (TEMOVATE) 0.05 % ointment Apply topically once daily, only to thick spots, as needed. 60 g 2   ??? clotrimazole (LOTRIMIN) 1 % cream Apply topically.     ??? diclofenac sodium (VOLTAREN) 1 % gel Apply 2 g topically.     ??? dicyclomine (BENTYL) 10 mg capsule TAKE 1 CAPSULE BY MOUTH 4 TIMES DAILY BEFORE MEAL(S) AND AT BEDTIME     ??? diphenhydrAMINE (BENADRYL) 25 mg capsule Take 1 capsule (25 mg total) by mouth every six (6) hours as needed for itching. 20 capsule 0   ??? docusate sodium 100 mg tablet Take 1 tablet (100 mg total) by mouth Two (2) times a day. 60 tablet 3   ??? DULoxetine (CYMBALTA) 60 MG capsule Take 60 mg by mouth daily.     ??? FLECTOR 1.3 % PT12 Place 1 patch on the skin daily.  0   ??? fluticasone (FLONASE) 50 mcg/actuation nasal spray USE TWO SPRAYS EACH nostril ONCE daily  6   ??? furosemide (LASIX) 40 MG tablet TAKE ONE TABLET BY MOUTH EVERY DAY 30 tablet 2   ??? hydrocortisone 2.5 % cream Apply topically.     ??? isosorbide dinitrate (ISORDIL) 20 MG tablet Take 20 mg by mouth Two (2) times a day.     ??? ketorolac (ACULAR) 0.5 % ophthalmic solution USE 1 DROP TO OPERATIVE EYE 4 TIMES A DAY AS DIRECTED STARTING 2 DAYS BEFORE SURGERY  2   ??? levoFLOXacin (LEVAQUIN) 500 MG tablet Take 1 tablet (500 mg total) by mouth daily. 30 tablet 6   ??? metFORMIN (GLUCOPHAGE) 500 MG tablet      ??? metoprolol succinate (TOPROL-XL) 25 MG 24 hr tablet Take 25 mg by mouth daily.     ??? montelukast (SINGULAIR) 10 mg tablet Take 10 mg by mouth nightly.  11   ??? moxifloxacin (VIGAMOX) 0.5 % ophthalmic solution INSTILL 1 DROP 4 TIMES A DAY STARTING 2 DAYS BEFORE SURGERY CONTINUE UNTIL TOLD OTHERWISE BY MD  2   ??? mupirocin (BACTROBAN) 2 % ointment Apply topically.     ??? nitroglycerin (NITROSTAT) 0.4 MG SL tablet Place 1 tablet (0.4 mg total) under the tongue every five (5) minutes as needed for chest pain. 30 tablet 0   ??? ondansetron (ZOFRAN) 4 MG tablet Take 4 mg by mouth.     ??? pantoprazole (PROTONIX) 40 MG tablet Take 1 tablet (40 mg total) by mouth daily. 90 tablet 3   ??? polyethylene glycol (MIRALAX) 17 gram packet Take 17 g by mouth daily. 30 packet 3   ??? potassium chloride (KLOR-CON) 20 MEQ CR tablet Take 20 mEq by mouth.     ??? prednisoLONE acetate (PRED FORTE) 1 % ophthalmic suspension INSTILL 1 DROP EVERY WAKING HOUR FIRST 2 DAYS ONLY THEN 4 TIMES DAILY 3 TO 7 DAYS AFTER  0   ??? ranitidine (ZANTAC) 15 mg/mL syrup Take by mouth Two (2) times a day.     ??? senna (SENNA LAX) 8.6 mg tablet Take 2 tablets by mouth nightly as needed (for no bowel movement during the day). 60 tablet 3   ??? spironolactone (ALDACTONE) 25 MG tablet Take 1 tablet (25 mg total) by mouth daily. 90 tablet 3   ??? sucralfate (CARAFATE) 100 mg/mL suspension TAKE 10 ML BY MOUTH 4 TIMES DAILY     ??? sulfamethoxazole-trimethoprim (BACTRIM DS) 800-160 mg per tablet sulfamethoxazole 800 mg-trimethoprim 160 mg  tablet   TAKE 1 TABLET BY MOUTH TWICE A DAY     ??? SYMBICORT 160-4.5 mcg/actuation inhaler INHALE TWO PUFFS BY MOUTH TWICE DAILY  11   ??? traMADoL (ULTRAM) 50 mg tablet Take 1 tablet (50 mg total) by mouth every eight (8) hours as needed for pain. 90 tablet 0   ??? triamcinolone (KENALOG) 0.1 % cream APPLY TO AFFECTED AREA(S) TWICE DAILY  11   ??? valACYclovir (VALTREX) 500 MG tablet Take 1 tablet (500 mg total) by mouth daily. 30 tablet 0   ??? ketorolac (TORADOL) 10 mg tablet Take 10 mg by mouth every six (6) hours as needed for pain.     ??? posaconazole (NOXAFIL) 100 mg TbEC delayed released tablet Take 3 tablets (300 mg) by mouth daily. (Patient not taking: Reported on 02/11/2019) 90 tablet 5   ??? venetoclax (VENCLEXTA) 100 mg tablet Take 4 tablets (400 mg total) by mouth daily. Take with a meal and water. Do not chew, crush, or break tablets. (Patient not taking: Reported on 02/11/2019) 120 tablet 2       Objective:   Temp:  [36.4 ??C (97.5 ??F)-36.7 ??C (98.1 ??F)] 36.4 ??C (97.5 ??F)  Heart Rate:  [84-91] 86  Resp:  [18-20] 20  BP: (121-138)/(48-64) 124/57  MAP (mmHg):  [70] 70  SpO2:  [94 %] 94 %  BMI (Calculated):  [33.56] 33.56    Physical Exam:  General: Well developed, well-nourished male in no acute distress. Sitting in infusion chair.  Neuro: A&Ox4. Generally appropriate affect, behavior appears childlike at times though. Speech fluent. CNII-CNXII grossly intact.   HEENT: PER. Hearing intact to conversation. No scleral icterus or conjunctival injection. MMM. Oropharynx without ulceration, erythema, or exudate.   Respiratory: Breathing is unlabored. Lungs CTAB, with exception of fine crackles in bases bilaterally.    Cardiovascular: RRR.  S1, S2 present.  No m/r/g. Extremities well perfused.   GI: Soft, mildly tender abdomen, distended. Active bowel sounds; evaluation of HSM limited by pt positioning in chair and body habitus.  Musculoskeletal: No grossly-evident deformities. ROM grossly ntact in all 4 extremities. No clubbing, cyanosis. There is 1+ pitting edema in BLE.   Skin: Warm, dry. Psoriasis plaques on bilateral elbows. Otherwise no rashes, obvious lesions, excessive petechiae/purpura.   CVAD: None      Test Results:  Labs/Data Review:    All lab results last 24 hours:    Recent Results (from the past 24 hour(s))   POCT Glucose    Collection Time: 02/11/19  6:07 PM   Result Value Ref Range    Glucose, POC 123 70 - 179 mg/dL   Basic Metabolic Panel    Collection Time: 02/11/19  6:42 PM   Result Value Ref Range    Sodium 135 135 - 145 mmol/L    Potassium 4.1 3.5 - 5.0 mmol/L    Chloride 97 (L) 98 - 107 mmol/L    CO2 32.0 (H) 22.0 - 30.0 mmol/L    Anion Gap 6 (L) 7 - 15 mmol/L    BUN 17 7 - 21 mg/dL    Creatinine 1.61 (L) 0.70 - 1.30 mg/dL    BUN/Creatinine Ratio 25     EGFR CKD-EPI Non-African American, Male >90 >=60 mL/min/1.65m2    EGFR CKD-EPI African American, Male >90 >=60 mL/min/1.21m2    Glucose 119 70 - 179 mg/dL    Calcium 8.3 (L) 8.5 - 10.2 mg/dL   Magnesium Level    Collection Time: 02/11/19  6:42 PM  Result Value Ref Range    Magnesium 1.5 (L) 1.6 - 2.2 mg/dL   Phosphorus Level    Collection Time: 02/11/19  6:42 PM   Result Value Ref Range    Phosphorus 5.2 (H) 2.9 - 4.7 mg/dL   Uric acid    Collection Time: 02/11/19  6:42 PM   Result Value Ref Range    Uric Acid 3.8 (L) 4.0 - 9.0 mg/dL   Lactate dehydrogenase    Collection Time: 02/11/19  6:42 PM   Result Value Ref Range    LDH 1,620 (H) 338 - 610 U/L   CBC w/ Differential    Collection Time: 02/11/19  6:42 PM   Result Value Ref Range    WBC 2.5 (L) 4.5 - 11.0 10*9/L    RBC 2.47 (L) 4.50 - 5.90 10*12/L    HGB 7.9 (L) 13.5 - 17.5 g/dL    HCT 13.0 (L) 86.5 - 53.0 %    MCV 98.3 80.0 - 100.0 fL    MCH 32.1 26.0 - 34.0 pg    MCHC 32.7 31.0 - 37.0 g/dL    RDW 78.4 (H) 69.6 - 15.0 %    MPV 7.9 7.0 - 10.0 fL    Platelet 21 (L) 150 - 440 10*9/L    Neutrophils % 32.3 %    Lymphocytes % 49.6 %    Monocytes % 5.6 %    Eosinophils % 1.6 %    Basophils % 4.0 %    Neutrophil Left Shift 2+ (A) Not Present    Absolute Neutrophils 0.8 (L) 2.0 - 7.5 10*9/L    Absolute Lymphocytes 1.2 (L) 1.5 - 5.0 10*9/L    Absolute Monocytes 0.1 (L) 0.2 - 0.8 10*9/L    Absolute Eosinophils 0.0 0.0 - 0.4 10*9/L    Absolute Basophils 0.1 0.0 - 0.1 10*9/L    Large Unstained Cells 11 (H) 0 - 4 %    Macrocytosis Moderate (A) Not Present    Anisocytosis Moderate (A) Not Present    Hypochromasia Slight (A) Not Present   Morphology Review    Collection Time: 02/11/19  6:42 PM   Result Value Ref Range    Smear Review Comments See Comment (A) Undefined   ECG 12 Lead    Collection Time: 02/11/19  7:21 PM   Result Value Ref Range    EKG Systolic BP  mmHg    EKG Diastolic BP  mmHg    EKG Ventricular Rate 80 BPM    EKG Atrial Rate 80 BPM    EKG P-R Interval 130 ms    EKG QRS Duration 100 ms    EKG Q-T Interval 366 ms    EKG QTC Calculation 422 ms    EKG Calculated P Axis 69 degrees    EKG Calculated R Axis 58 degrees    EKG Calculated T Axis 67 degrees    QTC Fredericia 403 ms   POCT Glucose    Collection Time: 02/11/19  7:37 PM   Result Value Ref Range    Glucose, POC 80 70 - 179 mg/dL       Imaging: Pending    EKG: normal EKG, normal sinus rhythm.      Assessment: 63 y.o. male with newly diagnosed AML admitted for initiation of azacitidine/Venetoclax therapy.       Plan:  AML, with MRC, complex karyotype, TP53 mutation: Presented to PCP with abd pain and fatigue, labs revealed pancytopenia. Diagnostic BMBx confirmed AML with TP53 mutation.  Multiple tx  regimens were discussed and decided to proceed with Azacitidine and Venetoclax. Plan for BMBx s/p C1.  - C1D1=10/6   - Azacitidine 75 mg/m2 SubQ daily, D1-7  - Venetoclax PO daily: 100 mg D1, 200 mg D2, 400 mg D3-7, then 100 mg D8 and forward (d/t posaconazole start)  - Ppx: Valtrex and Levaquin ppx. Plan to start Posaconazole D8  - Allopurinol 300 daily  - IVF boluses per tx plan  [ ]  CBC daily, TLS labs Q12H  - Transfuse RBCs if hemoglobin < 7, platelets if < 10K - MANUAL transfusions  [ ]  f/u admission COVID screen  [ ]  consider SW consult    Disposition  - can d/c Friday 10/9 if reliable dispo plan in place, otherwise will need to stay through 10/12 injection  [ ]  ensure PAP set up  [ ]  will need local f/u arranged at [facility] for labs/transfusions twice weekly  [ ]  consider HH at d/c for med management  [ ]  Follow-up with CPP should remain the same (Day 8 and Day 15 IN PERSON visits - may need to change).  - Previously followed by Dr. Rickard Patience in [place]     Future Appointments   Date Time Provider Department Center   02/17/2019  1:00 PM Malva Cogan, CPP [id number]   02/20/2019  2:30 PM ADULT ONC [facility]   02/20/2019  3:30 PM Avie Arenas, MD [id number]   02/24/2019 12:00 PM ADULT ONC LAB [facility]   02/24/2019  1:00 PM Kaitlyn Marie Buhlinger, CPP [id number]   03/06/2019 10:30 AM ADULT ONC LAB [facility]   03/06/2019 11:30 AM Adine Madura Sawchak, AGNP [id number]   03/13/2019 10:00 AM ADULT ONC LAB [facility]   03/13/2019 11:00 AM Angela Lillia Carmel, AGNP [id number]     Nurse Navigator: Milinda Antis, RN    CAD, HTN, HLD, s/p STEMI (2010), with stent placement (2010, 2017): Chronic conditions, follows with [facility]ology?. Reportedly, most recent cardiac cath in 2019 revealed normal LV function, and known stents. Pt uncertain of current medication regimen, but home med list includes: CCB, ACE, diuretics, BB, vasodilators and nitrates, statin. States he has been out of many meds recently and is unsure what he has been taking. States Plavix and Aspirin have been stopped since he was directed to stop these by Dr. Vertell Limber at recent outpatient visit. EKG on admission was NSR. He has not needed NTG tabs lately.   - Continue home Amlodipine  - Continue home Metoprolol  - Can titrate above meds as needed  - HOLD home ACE, diuretics, and statin d/t TLS risk and DDIs with chemo  - HOLD Plavix/ASA until thrombocytopenia resolution  - NTG PRN    Possible GIB: Months long hx of abd pain and dark stools, likely confounded by AML diagnosis and pancytopenia, requiring intermittent blood product transfusions. Pt has also noted BRBPR recently, and remote hx of hemorrhoids. Home med list includes protonix, ranitidine, bentyl, carafate. Follows with GI locally?  - KUB on admission with nonobstructive bowel pattern and moderate stool burden  - Monitor for melana, FOBT ordered   - Transfuse blood/platelets as needed  - Continue Protonix and Carafate  - Low threshold to consult GI should melana occur  - Bowel regimen PRN    DMT2: Chronic condition. Home med list included Metformin.  - Lispro SSI + Accuchecks 4x/daily    COPD: Reported chronic  condition. Uses Symbicort and albuterol inhalers intermittently. Reports recent addition of home O2 due to low O2 level, not needing on admission.   - CXR on admission with generally clear lungs with possible mild edema  - Continue Symbicort (or formulary alternative)  - Albuterol nebs PRN  [ ]  Admission COVID screen    Pain: Describes as arthritic pain in knees and hands, intermittent frontal headaches. States he needs surgery on his right knee.  - Tramadol 25-50 mg for mod-severe pain per pain scale  - Voltaren gel to knees scheduled 4x/daily  - Tylenol PRN    Anxiety depression: chronic condition. 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 Recently living at home alone, however planning to move in with sister after hospital d/c, for support while undergoing tx. WIll need local f/u arranged at [facility].   - Pharmacy to review meds again with pt in am  - May benefit from [facility] set up outpatient for medication management      DVT Ppx: none d/t thrombocytopenia, encourage ambulation  FEN:  - IV fluids per chemo orders  - Replete electrolytes as needed, monitoring for TLS  - Regular diet    Need for PT: No  Anticipated Discharge Date: 10/9 or 10/12     Code Status: Full code    Time spent on counseling/coordination of care: 1 Hour 30 Minutes  Total time spent with patient: 41 Minutes    Glendell Docker, NP  Hematology/Oncology  [phone]    02/11/19

## 2019-02-12 NOTE — Unmapped (Signed)
Recreational Therapy Evaluation  02/12/2019     Patient Name:  Raymond [name]       Medical Record Number: [id number]   Date of Birth: 07-Jun-1955  Sex: Male          Room/Bed:  4848/4848-01    Eval Duration: 64 Min.    Assessment  Raymond [name]??is a 63 y.o.??male??with newly diagnosed AML??and PMHx of NSTEMI/CAD s/t stents, DMT2, HLD, HTN, COPD, depression/anxiety, mild cognitive impairment??, who is admitted for??initiation of tx with Azacitidine and Venetoclax. Pt oriented to RT tx services with completion of eval dicussing prior leisure/life functioning, primary triggers/stressors, and adjustment to current hospitalization/new dx. Pt presents with increased stress/anxiety 2/2 unfamiliar environment; pt perseverates on disliking hospital setting and expressing desire to return home. Pt somewhat receptive to suggestions for healthy coping however carryover/involvement may be limited d/t cognitive deficits. RT reviewed various coping strategies, emphasized importance of utilizing preferred leisure activities, and introduced small ways to increase in-room activity although pt will likely require mod-max support/encouragement for initiation. Will continue POC with goals below.       Plan of Care  1x per day for: 2-3x week   Planned Treatment Duration 1 week    Patient's Stressors / Triggers: Hospitalization/separation from cats  Treatment Plan developed in collaboration with: Patient, Treatment Team  Interventions: Adjustment to hospitalization, Health Care Encounters and Medical Condition, Coping skills, Education - Patient, Energy Conservation, Goal setting, Psychosocial counseling, Relaxation training, Stress management, Wellness / recovery     Goals:  Within 4 tx sessions, pt will verbalize thoughts/feelings to RT re: dx/tx course in each of 2 sessions promoting discussion of healthy coping skills.   Prior to d/c, pt will engage in a leisure/wellness task >15 min x2 to improve overall engagement/activity tolerance.      Subjective    Current Situation: Pt resting in bed at time of RT arrival  Cognitive, Emotional, Physical, Social, and Leisure/Life functioning were assessed:: Patient Interviews, Review of Chart, Treatment Team, Observation in Activities/Interventions, No family present  Lives With: Alone  Equipment / Environment: Vascular access (PIV, TLC, Port-a-cath, PICC), Caregiver wearing mask for full session, Patient not wearing mask for full session  Reports/displays signs/symptoms of pain?: Yes  Pain Comments : Pt states his current pain regimen is not strong enough however team/RN aware of pain raitings  Add'l Session Information: No family/caregiver present    Past Medical History:   Diagnosis Date   ??? Angina pectoris (CMS-HCC)    ??? Anxiety    ??? Arthritis     bilateral knees   ??? CAD (coronary artery disease)    ??? Depression    ??? Dyslipidemia    ??? HL (hearing loss)    ??? Hyperlipidemia    ??? Hypertension    ??? Medically noncompliant    ??? Mental retardation    ??? MI (myocardial infarction) (CMS-HCC)    ??? Migraines    ??? Sleep apnea     USES CPAP OCCASIONALLY   ??? Thrombocytopenia (CMS-HCC) 01/20/2019    Social History     Tobacco Use   ??? Smoking status: Former Smoker     Packs/day: 1.00     Years: 5.00     Pack years: 5.00     Types: Cigarettes     Quit date: 04/11/2014     Years since quitting: 4.8   ??? Smokeless tobacco: Never Used   Substance Use Topics   ??? Alcohol use: No     Alcohol/week: 0.0  standard drinks      Past Surgical History:   Procedure Laterality Date   ??? APPENDECTOMY     ??? CARDIAC CATHETERIZATION     ??? CHOLECYSTECTOMY     ??? CORONARY STENT PLACEMENT     ??? HEMORRHOID SURGERY     ??? PR CATH PLACE/CORON ANGIO, IMG SUPER/INTERP,W LEFT HEART VENTRICULOGRAPHY N/A 02/25/2016    Procedure: Left Heart Catheterization W Intervention;  Surgeon: Job Founds, MD;  Location: [facility] CATH;  Service: Cardiology   ??? PR KNEE SCOPE,PART SYNOVECT Right 04/21/2015    Procedure: ARTHROSCOPY, KNEE, SURGICAL; SYNOVECTOMY LIMITED (EG, PLICA OR SHELF RESECTION) (SEPARATE PROCEDURE);  Surgeon: Annice Needy, MD;  [facility];  Service: Orthopedics   ??? PR KNEE SCOPE,SINGLE MENISECTOMY Right 04/21/2015    Procedure: ARTHROSCOPY, KNEE; W/MENISECTMY/CHONDROPLASTY/SYNOVECTOMY;  Surgeon: Annice Needy, MD;  Location: [name] [facility];  Service: Orthopedics   ??? PR XCAPSL CTRC RMVL INSJ IO LENS PROSTH W/O ECP Right 03/12/2017    Procedure: CATARACT RIGHT EYE;  Surgeon: Gwynneth Aliment, MD;  Location: [name] [facility];  Service: Ophthalmology   ??? PR XCAPSL CTRC RMVL INSJ IO LENS PROSTH W/O ECP Left 05/28/2017    Procedure: CATARACT LEFT EYE;  Surgeon: Gwynneth Aliment, MD;  [facility] [name] [facility];  Service: Ophthalmology   ??? SKIN BIOPSY     ??? TONSILLECTOMY      Family History   Problem Relation Age of Onset   ??? Heart attack Father    ??? Melanoma Neg Hx    ??? Basal cell carcinoma Neg Hx    ??? Squamous cell carcinoma Neg Hx         Allergies: Patient has no known allergies.     Objective    Cognitive  Stage of change / level of insight: Pre-contemplation  Judgment: Limited  Attention Span/Alertness : Able to attend to RT assessment  Medical understanding: Would benefit from additional dx/tx education'  Orientation: Fully oriented    Communication  Communication Barriers: None noted    Emotional  Mood: Anxious, Depressed, Overwhelmed  Affect: Congruent  Anxiety Management : Reports anxiety that is situational  Pain Management : Reports pain that is situational  Frustration Tolerance: Reports frustration that is situational  Coping Skills : Requires resources/assistance to utilize coping strategies  Motivated to learn new coping strategies?: Yes, Limited  Adjustment: Not acknowledging need to improve adjustment  Emotional Expression: Expresses feelings with cues/resources  Additional Emotional Domain comments: Pt with h/o anxiety and depression. During assessment, pt presents with increased stress/anxiety 2/2 unfamiliar environment; pt perseverates on disliking hospital setting and expressing desire to return home. Pt somewhat receptive to suggestions for healthy coping however appears to be limited d/t mild cognitive impairment.    Physical Domain  Mobility Comments: Pt reports indepdendent ambulation and ADLs at this time  Exercise readiness comment: Pt reports having arthritis in bilateral knees that impair his functioning although endorsees in-room ambulation/short distances  Additional Physical Domain comments: Pt at risk for decline in physical functioning 2/2 dx/tx course    Social  Support system: Reports limited support  Patient Behaviors and Interactions: Appropriate  Additional Social Domain Comments: Pt appears to have limited social support as he states his primary daily routine includes being home alone with his cats     Leisure and Life Function  Level of involvement: Occasional participation  Community reintegration / barrier education: Unable to return to community at this time  Motivation to engage in leisure /  play: Limited  Quality of participation: Involved in healthy leisure though dissatisfied with barriers  Additional Leisure and Life Function Comments: Pt reports limited leisure involvement as he reports minimal interests however enjoys watching TV, listening to music, and spending time with his cats    I attest that I have reviewed the above information.  Signed by Loma Boston 02/12/2019
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
## 2019-02-12 NOTE — Unmapped (Signed)
Rapid Response Consult Note    Critical Care Outreach consult was ordered for lab collection.    On Unit 4 oncology     Paged regarding ABG collection.  Once prepped to collect lab, patient adamantly refused.  Educated regarding reason for lab, but patient continued to refuse.  Primary RN notified.  Primary RN to notify provider.    The time frame for follow-up reassessment No follow-up is needed at this time    Primary nurse was educated to submit page at the above time frame established, but if patient exhibits any acute deviations from baseline and or have signs and symptoms of patient deterioration, the recommendation is to activate the rapid response team.    Thank you for your consult,     Ladona Horns Mensah-bonsu RN

## 2019-02-13 DIAGNOSIS — R1110 Vomiting, unspecified: Secondary | ICD-10-CM | POA: Diagnosis not present

## 2019-02-13 DIAGNOSIS — C9200 Acute myeloblastic leukemia, not having achieved remission: Secondary | ICD-10-CM | POA: Diagnosis not present

## 2019-02-13 DIAGNOSIS — R079 Chest pain, unspecified: Secondary | ICD-10-CM | POA: Diagnosis not present

## 2019-02-13 DIAGNOSIS — R109 Unspecified abdominal pain: Secondary | ICD-10-CM | POA: Diagnosis not present

## 2019-02-13 DIAGNOSIS — G3184 Mild cognitive impairment, so stated: Secondary | ICD-10-CM | POA: Diagnosis not present

## 2019-02-13 DIAGNOSIS — R140 Abdominal distension (gaseous): Secondary | ICD-10-CM | POA: Diagnosis not present

## 2019-02-13 LAB — CBC W/ AUTO DIFF
BASOPHILS ABSOLUTE COUNT: 0.1 10*9/L (ref 0.0–0.1)
BASOPHILS RELATIVE PERCENT: 3 %
EOSINOPHILS ABSOLUTE COUNT: 0 10*9/L (ref 0.0–0.4)
HEMATOCRIT: 21 % — ABNORMAL LOW (ref 41.0–53.0)
HEMOGLOBIN: 7 g/dL — ABNORMAL LOW (ref 13.5–17.5)
LARGE UNSTAINED CELLS: 15 % — ABNORMAL HIGH (ref 0–4)
LYMPHOCYTES ABSOLUTE COUNT: 0.6 10*9/L — ABNORMAL LOW (ref 1.5–5.0)
LYMPHOCYTES RELATIVE PERCENT: 33.3 %
MEAN CORPUSCULAR HEMOGLOBIN CONC: 33.3 g/dL (ref 31.0–37.0)
MEAN CORPUSCULAR HEMOGLOBIN: 32.2 pg (ref 26.0–34.0)
MEAN CORPUSCULAR VOLUME: 96.6 fL (ref 80.0–100.0)
MEAN PLATELET VOLUME: 11.6 fL — ABNORMAL HIGH (ref 7.0–10.0)
MONOCYTES ABSOLUTE COUNT: 0.1 10*9/L — ABNORMAL LOW (ref 0.2–0.8)
MONOCYTES RELATIVE PERCENT: 7.2 %
NEUTROPHILS ABSOLUTE COUNT: 0.7 10*9/L — ABNORMAL LOW (ref 2.0–7.5)
NEUTROPHILS RELATIVE PERCENT: 40.2 %
PLATELET COUNT: 19 10*9/L — ABNORMAL LOW (ref 150–440)
RED BLOOD CELL COUNT: 2.17 10*12/L — ABNORMAL LOW (ref 4.50–5.90)
RED CELL DISTRIBUTION WIDTH: 19.2 % — ABNORMAL HIGH (ref 12.0–15.0)
WBC ADJUSTED: 1.7 10*9/L — ABNORMAL LOW (ref 4.5–11.0)

## 2019-02-13 LAB — COMPREHENSIVE METABOLIC PANEL
ALBUMIN: 2.7 g/dL — ABNORMAL LOW (ref 3.5–5.0)
ALBUMIN: 2.7 g/dL — ABNORMAL LOW (ref 3.5–5.0)
ALKALINE PHOSPHATASE: 60 U/L (ref 38–126)
ALKALINE PHOSPHATASE: 60 U/L (ref 38–126)
ALT (SGPT): 20 U/L (ref <50)
ALT (SGPT): 21 U/L (ref <50)
ANION GAP: 8 mmol/L (ref 7–15)
ANION GAP: 3 mmol/L — ABNORMAL LOW (ref 7–15)
AST (SGOT): 38 U/L (ref 19–55)
AST (SGOT): 38 U/L (ref 19–55)
BILIRUBIN TOTAL: 1 mg/dL (ref 0.0–1.2)
BILIRUBIN TOTAL: 1.4 mg/dL — ABNORMAL HIGH (ref 0.0–1.2)
BLOOD UREA NITROGEN: 17 mg/dL (ref 7–21)
BLOOD UREA NITROGEN: 17 mg/dL (ref 7–21)
BUN / CREAT RATIO: 26
BUN / CREAT RATIO: 27
CALCIUM: 7.6 mg/dL — ABNORMAL LOW (ref 8.5–10.2)
CALCIUM: 8.1 mg/dL — ABNORMAL LOW (ref 8.5–10.2)
CHLORIDE: 93 mmol/L — ABNORMAL LOW (ref 98–107)
CHLORIDE: 92 mmol/L — ABNORMAL LOW (ref 98–107)
CO2: 33 mmol/L — ABNORMAL HIGH (ref 22.0–30.0)
CO2: 39 mmol/L — ABNORMAL HIGH (ref 22.0–30.0)
CREATININE: 0.65 mg/dL — ABNORMAL LOW (ref 0.70–1.30)
CREATININE: 0.62 mg/dL — ABNORMAL LOW (ref 0.70–1.30)
EGFR CKD-EPI AA MALE: >90 mL/min/{1.73_m2} (ref >=60)
EGFR CKD-EPI AA MALE: >90 mL/min/{1.73_m2} (ref >=60)
EGFR CKD-EPI NON-AA MALE: >90 mL/min/{1.73_m2} (ref >=60)
EGFR CKD-EPI NON-AA MALE: >90 mL/min/{1.73_m2} (ref >=60)
GLUCOSE RANDOM: 102 mg/dL (ref 70–179)
GLUCOSE RANDOM: 102 mg/dL (ref 70–179)
POTASSIUM: 4.1 mmol/L (ref 3.5–5.0)
POTASSIUM: 3.7 mmol/L (ref 3.5–5.0)
PROTEIN TOTAL: 5.3 g/dL — ABNORMAL LOW (ref 6.5–8.3)
PROTEIN TOTAL: 5.5 g/dL — ABNORMAL LOW (ref 6.5–8.3)

## 2019-02-13 LAB — MONOCYTES ABSOLUTE COUNT: Monocytes:NCnc:Pt:Bld:Qn:Automated count: 0.1 — ABNORMAL LOW

## 2019-02-13 LAB — LACTATE DEHYDROGENASE
Lactate dehydrogenase:CCnc:Pt:Ser/Plas:Qn:Reaction: pyruvate to lactate: 1179 — ABNORMAL HIGH
Lactate dehydrogenase:CCnc:Pt:Ser/Plas:Qn:Reaction: pyruvate to lactate: 1232 — ABNORMAL HIGH

## 2019-02-13 LAB — BILIRUBIN TOTAL: Bilirubin:MCnc:Pt:Ser/Plas:Qn:: 1

## 2019-02-13 LAB — PHOSPHORUS
Phosphate:MCnc:Pt:Ser/Plas:Qn:: 3.3
Phosphate:MCnc:Pt:Ser/Plas:Qn:: 4

## 2019-02-13 LAB — URIC ACID
Urate:MCnc:Pt:Ser/Plas:Qn:: 4
Urate:MCnc:Pt:Ser/Plas:Qn:: 4

## 2019-02-13 LAB — MAGNESIUM: Magnesium:MCnc:Pt:Ser/Plas:Qn:: 1.6

## 2019-02-13 LAB — SMEAR REVIEW

## 2019-02-13 LAB — BLOOD UREA NITROGEN: Urea nitrogen:MCnc:Pt:Ser/Plas:Qn:: 17

## 2019-02-13 MED ORDER — SEVELAMER CARBONATE 800 MG PO TABS
800.00 | ORAL_TABLET | ORAL | Status: DC
Start: 2019-02-14 — End: 2019-02-13

## 2019-02-13 MED ORDER — GENERIC EXTERNAL MEDICATION
Status: DC
Start: ? — End: 2019-02-13

## 2019-02-13 MED ORDER — SENNOSIDES 8.6 MG PO TABS
2.00 | ORAL_TABLET | ORAL | Status: DC
Start: 2019-02-17 — End: 2019-02-13

## 2019-02-13 MED ORDER — DOCUSATE SODIUM 100 MG PO CAPS
100.00 | ORAL_CAPSULE | ORAL | Status: DC
Start: 2019-02-18 — End: 2019-02-13

## 2019-02-13 MED ORDER — PROCHLORPERAZINE MALEATE 5 MG PO TABS
5.00 | ORAL_TABLET | ORAL | Status: DC
Start: ? — End: 2019-02-13

## 2019-02-13 MED ORDER — POLYETHYLENE GLYCOL 3350 17 G PO PACK
17.00 | PACK | ORAL | Status: DC
Start: 2019-02-18 — End: 2019-02-13

## 2019-02-13 MED FILL — POSACONAZOLE 100 MG TABLET,DELAYED RELEASE: 30 days supply | Qty: 90 | Fill #0 | Status: AC

## 2019-02-13 NOTE — Unmapped (Signed)
Pharmacy: First Cycle Chemotherapy Patient Education  Raymond [name] is a 63 y.o. year old with newly diagnosed AML who was admitted to the [facility] and will receive their first cycle of chemotherapy. Patient education reviewing the chemotherapy is provided to the patient by their oncology pharmacist.     Chemotherapy regimen/agents: azacitidine 75 mg/m2 SQ D1-7 and venetoclax daily   Day 1 of chemotherapy: 02/11/19  Central Venous Access Device: not indicated     For each of the following items listed below and where applicable, signs and symptoms of adverse effects as well as self-care management were reviewed. Additionally, chemotherapy scheduling, chemotherapy agents, mechanism of action, exposure risk mitigation for caregivers, drug-drug/food/herbal interactions and supportive care measures were also reviewed with the patient.     Side effects discussed included but were not limited to: neutropenia/febrile neutropenia, thrombocytopenia, anemia, nausea/vomiting, hypersensitivity reactions, hepatotoxicity and diarrhea.    A/P:  1. The patient and his sister verbalized understanding of this information. The patient's drug profile was reviewed for drug interactions with the chemotherapy and no interactions were present at the time of review. The patient was given educational materials that included resources from the [facility] patient education library, EPIC, Product/process development scientist. Patient education videos from [employer] were offered.  2. Discussed posaconazole interaction with venetoclax. Sister understood that on Day 8 he would start posaconazole 100 mg, 3 tablets daily and decrease venetoclax to 100 mg daily.   3. Discussed need to bring medications with him if he were to go to the ED as hospitals do not keep venetoclax on formulary.   4. Mentioned using a pill box to help with adherence of all meds.    Approximate time spent with patient: 10 minutes.    Thank you, please feel free to contact with any questions or concerns.    Deatra Ina,  PharmD Candidate

## 2019-02-13 NOTE — Unmapped (Signed)
VSS. Pt alert and oriented x4. Psych consulted. 1 unit of pRBC transfused, no concerns. Pt refusing COVID swab, primary team and charge RN notified. Admission completed. Pt much more consolable today, no longer refusing plan of care. Pt remains on cont pulse ox, calls from tele d/t pt removing 2L O2 while sleeping.     Problem: Adult Inpatient Plan of Care  Goal: Plan of Care Review  Outcome: Ongoing - Unchanged  Goal: Patient-Specific Goal (Individualization)  Outcome: Ongoing - Unchanged  Goal: Absence of Hospital-Acquired Illness or Injury  Outcome: Ongoing - Unchanged

## 2019-02-13 NOTE — Unmapped (Signed)
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 male with newly diagnosed AML and PMHx of NSTEMI/CAD s/t stents, DMT2, HLD, HTN, COPD, depression/anxiety, mild cognitive impairment , who is admitted for initiation of tx with Azacitidine and Venetoclax.    Plan Summary: C1D3=02/13/19 Aza/Ven. Plan to keep over the weekend given complicated disposition. Patient has lifelong intellectual disability and will be relocating to live with his sister Raymond [name] (in [place]) upon discharge (her number is [phone]). Touched base with [name], he has been referred to [facility] but has yet to get established (has appt w/Dr. Glade Nurse on 11/6 at 1pm, will need to be seen prior to being allowed to have labs/support scheduled there). Planning to discharge Monday 10/12 (Day 7) after last Aza injection (scheduled for 10:30am), he has labs and follow up w/Dr. Vertell Limber 10/15 and appt w/Buhlinger (D15) on 10/19. Requested 2x/weekly labs with support starting 10/19, awaiting scheduling. [name], CM setting up [facility] medication management in [place] area. Raymond [name] (sister) to pick up his 02 prior to picking up patient after discharged so its available for drive home. Per Rx no need for PAP assistance, all medications including Posa has been approved through his insurance. He is on 2L 02 continuous (home 02 dose), CPAP HS (not tolerating, only will allow [place] and is stable on this). On lasix 40 mg daily, monitor weight/net +/- daily, may need additional lasix.  02 goal 88-92% placed. Also has facial red scabbed areas, patchy, states are itchy at times, no drainage (hx psorarsis), cetirizine daily added. CTM. Had brief episode of chest pain ON that spont. Resolved, Imdur added today. EKG no change, NSR. Only one BM since admission, KUB showed moderate stool burden, lactulose added till BM. KUB repeated today given some abdominal pain, stool burden, abd. Pain resolved on later exam. Watch for BM.      AML, with MRC, complex karyotype, TP53 mutation: Presented to PCP with abd pain and fatigue, labs revealed pancytopenia. Diagnostic BMBx confirmed AML with TP53 mutation.  Multiple tx regimens were discussed and decided to proceed with Azacitidine and Venetoclax. Plan for BMBx s/p C1.  - C1D1=10/6   - Azacitidine 75 mg/m2 SubQ daily, D1-7 inpatient (chemo orders adjusted, chemo clarification placed)  - Venetoclax PO daily: 100 mg D1, 200 mg D2, 400 mg D3-7, then 100 mg D8 and forward (d/t posaconazole start)  - Ppx: Valtrex and Levaquin ppx. Plan to start Posaconazole D8  - Allopurinol 300 daily  - IVF boluses per tx plan  [ ]  CBC daily, TLS labs Q12H  - Transfuse RBCs if hemoglobin < 7, platelets if < 10K - MANUAL transfusions  [ ]  f/u admission COVID screen (patient has refused numerous times despite education and risks/benefits discussion)  ??  Disposition  - 10/15 Labs at 2:30pm followed by 3:30pm appt w/Dr. Vertell Limber  - 10/19 Labs 12pm followed by appt w/[name] at 1 pm (D15 appointment)  - Requested labs with transfusion support 2x/weekly starting 10/19 (careful not to duplicate appointments), awaiting scheduling   - [name] coordinating  HH in [place] area for medication management     COPD: Reported chronic condition. Uses Symbicort and albuterol inhalers intermittently. Reports recent addition of home O2 due to low O2 level, not needing on admission.   - CXR on admission with generally clear lungs with possible mild edema  - Continue Symbicort (or formulary alternative)  - 10/7 overnight had hypoxic episode ON to 79% on room air, was given neb and Lasix 20 mg IVP and placed on CPAP, improved. Of note recently placed on 2 L home 02 per local oncologist (Dr. Dr. Rickard Patience in [place]) that he is supposed to wear all the time (per pt and pts sister)  - O2 88-92% goal ordered, nursing instructed to titrate 02 as able (currently on 2L)  - Albuterol nebs QID  - Lasix 40 mg Daily added  - daily weights, strict I/O ordered  [ ]  Admission COVID screen (patient continues to refuse)  - Sister Raymond [name]) to pick up patients 02 in [place] prior to picking him up on day of discharge, she is aware tentative d/c date is 10/12      Possible GIB: Months long hx of abd pain and dark stools, likely confounded by AML diagnosis and pancytopenia, requiring intermittent blood product transfusions. Pt has also noted BRBPR recently, and remote hx of hemorrhoids. Home med list includes protonix, ranitidine, bentyl, carafate. Follows with GI locally?  - KUB on admission with nonobstructive bowel pattern and moderate stool burden  - Monitor for melana/BRBPR  - Per attending hold off on FOBT  - Transfuse blood/platelets as needed  - Continue Protonix and Carafate  - Low threshold to consult GI should melana/BRBPR occur  - Bowel regimen PRN  - 10/8 c/o abdominal pain to slight touch, KUB not much change from admission, later in day patient denied abdominal pain (difficult to gauge his pain threshold given developmental disability), continues to deny melena/BRBPR  - On scheduled Miralax, Senna, Colace  - Added Lactulose 20 g TID until BM today    Hx of Psorasis: Patient has BUE psoriasis changes noted as well as bilteral facial scab areas that he notes are itchy at times, suspect patient is picking at his face.   - Cetirizine daily added  - Eucerin cream added  - No drainage, erythema or other c/f infection noted  - CTM    CAD, HTN, HLD, s/p STEMI (2010), with stent placement (2010, 2017): Chronic conditions, follows with [facility]?. Reportedly, most recent cardiac cath in 2019 revealed normal LV function, and known stents. Pt uncertain of current medication regimen, but home med list includes: CCB, ACE, diuretics, BB, vasodilators and nitrates, statin. States he has been out of many meds recently and is unsure what he has been taking. States Plavix and Aspirin have been stopped since he was directed to stop these by Dr. Vertell Limber at recent outpatient visit. EKG on admission was NSR. He has not needed NTG tabs lately.   - Continue home Amlodipine  - Continue home Metoprolol  - Can titrate above meds as needed  - HOLD home ACE, diuretics, and statin d/t TLS risk and DDIs with chemo  - HOLD Plavix/ASA until thrombocytopenia resolution  - NTG PRN  - 10/8 Imdur 30 mg added, patient c/o episode of chest pain ON (not called to night coverage) that resolved on its own, EKG repeated today, NSR, no change from prior. Of note we are hold dual antiplatelet therapy and his antianginals   ??  DMT2: Chronic condition. Home  med list included Metformin.  - Lispro SSI + Accuchecks 4x/daily  ??  Pain: Describes as arthritic pain in knees and hands, intermittent frontal headaches. States he needs surgery on his right knee.  - Tramadol 25-50 mg for mod-severe pain per pain scale  - Voltaren gel to knees scheduled 4x/daily  - Tylenol PRN  ??  Anxiety depression: chronic condition. Home med list included Celexa, Cymbalta  - Continue home Celexa  - QTcF on admission 403  ??  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 Recently living at home alone, however planning to move in with sister after hospital d/c, for support while undergoing tx. WIll need local f/u arranged at [facility].   - Pharmacy to review home meds to consolidate prior to d/c  - Psych consulted given patients frequent refusal of care, patient refusing HCPOA designation paperwork    ??  DVT Ppx: none d/t thrombocytopenia, encourage ambulation  FEN:  - IV fluids per chemo orders  - Replete electrolytes as needed, monitoring for TLS  - Regular diet  ??  Need for PT: No  Anticipated Discharge Date: 10/12  ??  Code Status: Full code  ??  Subjective:     Interval History: Patient is more calm today. Denies any fever, chills. Denies any cough or SOB. C/o episode of chest pain overnight that spontaneously resolved, EKG no change from prior. Has only had one BM since admission, regimen increased (lactulose added). Denies any rectal bleeding or noted melena on last BM 10/6. Did c/o abdominal pain today, KUB little change from admission (moderate stool burden). Abdominal pain resolved on later exam. Appetite stable. One time episode of clear vomiting, relieved with compazine.    10 point ROS otherwise negative except as above in the HPI.     Objective:     Vital signs in last 24 hours:  Temp:  [36.5 ??C (97.7 ??F)-37.4 ??C (99.3 ??F)] 36.5 ??C (97.7 ??F)  Heart Rate:  [78-96] 95  Resp:  [18-20] 18  BP: (103-125)/(41-59) 114/42  FiO2 (%):  [32 %] 32 %  SpO2:  [82 %-100 %] 92 %    Intake/Output last 3 shifts:  I/O last 3 completed shifts:  In: 500 [I.V.:500]  Out: 2725 [Urine:2725]    Meds:  Current Facility-Administered Medications   Medication Dose Route Frequency Provider Last Rate Last Dose   ??? acetaminophen (TYLENOL) tablet 650 mg  650 mg Oral Q6H PRN [facility], AGNP   650 mg at 02/13/19 0949   ??? albuterol 2.5 mg /3 mL (0.083 %) nebulizer solution 2.5 mg  2.5 mg Nebulization Q6H PRN Army Chaco Vogler, AGNP   2.5 mg at 02/12/19 0202   ??? allopurinoL (ZYLOPRIM) tablet 300 mg  300 mg Oral Daily [address] [place], AGNP   300 mg at 02/13/19 0347   ??? amLODIPine (NORVASC) tablet 5 mg  5 mg Oral Daily [facility], AGNP   5 mg at 02/13/19 4259   ??? azaCITIDine (VIDAZA) syringe 164.25 mg  75 mg/m2 (Order-Specific) Subcutaneous Q22H Oris Drone, RPH   164.25 mg at 02/12/19 2051   ??? cetirizine (ZyrTEC) tablet 10 mg  10 mg Oral Daily Delrae Rend Clover Mealy, NP   10 mg at 02/13/19 0949   ??? CHEMO CLARIFICATION ORDER   Other Continuous PRN Oris Drone, [facility]       ??? citalopram (CeleXA) tablet 20 mg  20 mg Oral Daily [address] [place]  20 mg at 02/13/19 0949   ??? dextrose 50 % in water (D50W) 50 % solution 12.5 g  12.5 g Intravenous Q10 Min PRN [address] [place], [place]       ??? diclofenac sodium (VOLTAREN) 1 % gel 2 g  2 g Topical 4x Daily [address] [place], AGNP   2 g at 02/12/19 2006   ??? diphenhydrAMINE (BENADRYL) injection 25 mg  25 mg Intravenous Q4H PRN Avie Arenas, MD       ??? docusate sodium (COLACE) capsule 100 mg  100 mg Oral Daily [facility] Clover Mealy, NP   100 mg at 02/13/19 0949   ??? EPINEPHrine (EPIPEN) injection 0.3 mg  0.3 mg Intramuscular Daily PRN Avie Arenas, MD       ??? famotidine (PF) (PEPCID) injection 20 mg  20 mg Intravenous Q4H PRN Avie Arenas, MD       ??? fluticasone furoate-vilanteroL (BREO ELLIPTA) 200-25 mcg/dose inhaler 1 puff  1 puff Inhalation Daily (RT) Army Chaco Vogler, AGNP   1 puff at 02/13/19 0950   ??? furosemide (LASIX) tablet 40 mg  40 mg Oral Daily Laurice Record, FNP   40 mg at 02/13/19 0949   ??? insulin lispro (HumaLOG) injection 0-12 Units  0-12 Units Subcutaneous ACHS [address] [place], [place]       ??? IP OKAY TO TREAT   Other Continuous PRN Ernie Hew, AGNP       ??? ipratropium-albuteroL (DUO-NEB) 0.5-2.5 mg/3 mL nebulizer solution 3 mL  3 mL Nebulization Q6H (RT) Bejal Marya Fossa, FNP   3 mL at 02/13/19 0950   ??? isosorbide mononitrate (IMDUR) 24 hr tablet 30 mg  30 mg Oral Daily Gabriela Launa Grill, NP   30 mg at 02/13/19 1155   ??? lactulose (CHRONULAC) oral solution (30 mL cup)  20 g Oral TID Arley Phenix, NP   20 g at 02/13/19 1415   ??? levoFLOXacin (LEVAQUIN) tablet 500 mg  500 mg Oral Daily [address] [place], AGNP   500 mg at 02/13/19 5784   ??? loperamide (IMODIUM) capsule 2 mg  2 mg Oral Q2H PRN Ernie Hew, AGNP       ??? loperamide (IMODIUM) capsule 4 mg  4 mg Oral Once PRN Ernie Hew, AGNP       ??? meperidine (DEMEROL) injection 25 mg  25 mg Intravenous Q30 Min PRN Avie Arenas, MD       ??? methylPREDNISolone sodium succinate (PF) (Solu-MEDROL) injection 125 mg  125 mg Intravenous Q4H PRN Avie Arenas, MD       ??? metoprolol succinate (TOPROL-XL) 24 hr tablet 25 mg  25 mg Oral Daily [address], AGNP   25 mg at 02/12/19 1711   ??? nitroglycerin (NITROSTAT) SL tablet 0.4 mg  0.4 mg Sublingual Q5 Min PRN [address] [place], AGNP       ??? ondansetron (ZOFRAN) injection 8 mg  8 mg Intravenous Once PRN Avie Arenas, MD       ??? ondansetron [facility]) tablet 16 mg  16 mg Oral Q22H Oris Drone, RPH   16 mg at 02/12/19 2002   ??? pantoprazole (PROTONIX) EC tablet 40 mg  40 mg Oral Daily [address] [place], AGNP   40 mg at 02/13/19 6962   ??? polyethylene glycol (MIRALAX) packet 17 g  17 g Oral Daily Arley Phenix, NP   17 g at 02/13/19 9528   ???  prochlorperazine (COMPAZINE) tablet 5 mg  5 mg Oral Q6H PRN Arley Phenix, NP   5 mg at 02/13/19 0936   ??? senna (SENOKOT) tablet 2 tablet  2 tablet Oral Nightly Gabriela Sarita Clover Mealy, NP       ??? sevelamer (RENVELA) tablet 800 mg  800 mg Oral 3xd Meals [facility] Clover Mealy, NP   800 mg at 02/13/19 1155   ??? sodium chloride (NS) 0.9 % infusion  20 mL/hr Intravenous Continuous PRN Avie Arenas, MD       ??? sodium chloride 0.9% (NS) bolus 1,000 mL  1,000 mL Intravenous Daily PRN Avie Arenas, MD       ??? sodium chloride 0.9% (NS) bolus 500 mL  500 mL Intravenous Q24H Avie Arenas, MD 250 mL/hr at 02/12/19 1811 500 mL at 02/12/19 1811   ??? sucralfate (CARAFATE) oral suspension  1 g Oral ACHS [address] [place], [place]   1 g at 02/13/19 1155   ??? traMADoL (ULTRAM) tablet 25 mg  25 mg Oral Q6H PRN [facility], AGNP        Or   ??? traMADoL (ULTRAM) tablet 50 mg  50 mg Oral Q6H PRN [facility], AGNP   50 mg at 02/13/19 0145   ??? valACYclovir (VALTREX) tablet 500 mg  500 mg Oral Daily [address] [place], AGNP 500 mg at 02/13/19 2956   ??? venetoclax (VENCLEXTA) tablet 400 mg  400 mg Oral Daily Avie Arenas, MD       ??? white petrolatum-mineral oil-lanolin topical cream   Topical TID PRN Arley Phenix, NP           Physical Exam:  General: Resting, in no apparent distress, lying in bed  HEENT:  PERRL. No scleral icterus or conjunctival injection. MMM without ulceration, erythema or exudate. No cervical or axillary lymphadenopathy.   Heart:  RRR. S1, S2. No murmurs, gallops, or rubs.  Lungs:  Breathing is unlabored, and patient is speaking full sentences with ease. Faint wheezing throughout, diminished air movement  Abdomen:  No distention or pain on palpation.  Bowel sounds are present and normoactive x 4.  No palpable hepatomegaly or splenomegaly.  No palpable masses. TTP on AM exam though resolved in the afternoon  Skin: Warm to touch, dry. Facial scabbed areas bilaterally, no drainage, itchy at times per patient, psoriasis to BUE  Musculoskeletal:  No grossly-evident joint effusions or deformities.  Range of motion about the shoulder, elbow, hips and knees is grossly normal.  Psychiatric:  Range of affect is appropriate.    Neurologic:  Alert and oriented to person, place, time and situation.  Gait is normal.  No Nystagmus Cerebellar tasks (finger-to-nose, rapid hand movement, heel along shin) are completed with ease and are symmetric. CNII-CNXII grossly intact.  Extremities:  Appear well-perfused. No clubbing, edema, or cyanosis.  CVAD: R CW Port - no erythema, nontender; dressing CDI.      Labs:  Recent Labs     02/11/19  1842 02/12/19  0555 02/12/19  1713 02/13/19  0538   WBC 2.5* 2.4*  --  1.7*   NEUTROABS 0.8* 0.9*  --  0.7*   LYMPHSABS 1.2* 1.2*  --  0.6*   HGB 7.9* 7.0*  --  7.0*   HCT 24.3* 21.7*  --  21.0*   PLT 21* 20*  --  19*   CREATININE 0.67* 0.61* 0.63* 0.65*   BUN 17 14 15  17  BILITOT  --  0.5 0.8 1.0   AST  --  28 33 38   ALT  --  11 15 20    ALKPHOS  --  53 61 60   K 4.1 3.6 3.8 4.1   MG 1.5* 1.6  --  1.6   CALCIUM 8.3* 7.8* 8.0* 7.6*   NA 135 133* 132* 134*   CL 97* 94* 94* 93*   CO2 32.0* 31.0* 32.0* 33.0*   PHOS 5.2* 5.6* 4.1 4.0       Imaging:  CXR reviewed per EMR    Julieanne Cotton, ACNP-BC  02/13/2019  4:12 PM   Nurse Practitioner  Hematology/Oncology Department   [facility]   [facility] pager: [phone] or [phone]

## 2019-02-13 NOTE — Unmapped (Addendum)
Pt alert & oriented. Compliant with taking medications. RT placed cpap on pt before bed, and pt taking off mask shortly after stating to nurse that he can't wear it. Nasal cannula placed back on pt. Pt also c/o chest pain after going to bathroom to attempt to have BM. Pt was unsuccessful in having BM. After patient rested for a few minutes, he stated that pain had subsided. He is currently on 3L O2  Since c/o chest pain. Received acetaminophen and tramadol prn for c/o knee and abdominal pain. Remains free of falls. Will monitor.      7425: Pt sats 90-91% on 2L now. Pt is sleeping.

## 2019-02-14 ENCOUNTER — Encounter (HOSPITAL_COMMUNITY): Payer: Self-pay | Admitting: Oncology

## 2019-02-14 DIAGNOSIS — J90 Pleural effusion, not elsewhere classified: Secondary | ICD-10-CM | POA: Diagnosis not present

## 2019-02-14 DIAGNOSIS — R590 Localized enlarged lymph nodes: Secondary | ICD-10-CM | POA: Diagnosis not present

## 2019-02-14 DIAGNOSIS — R0902 Hypoxemia: Secondary | ICD-10-CM | POA: Diagnosis not present

## 2019-02-14 DIAGNOSIS — I313 Pericardial effusion (noninflammatory): Secondary | ICD-10-CM | POA: Diagnosis not present

## 2019-02-14 DIAGNOSIS — R918 Other nonspecific abnormal finding of lung field: Secondary | ICD-10-CM | POA: Diagnosis not present

## 2019-02-14 DIAGNOSIS — C9200 Acute myeloblastic leukemia, not having achieved remission: Secondary | ICD-10-CM | POA: Diagnosis not present

## 2019-02-14 DIAGNOSIS — R0682 Tachypnea, not elsewhere classified: Secondary | ICD-10-CM | POA: Diagnosis not present

## 2019-02-14 DIAGNOSIS — R162 Hepatomegaly with splenomegaly, not elsewhere classified: Secondary | ICD-10-CM | POA: Diagnosis not present

## 2019-02-14 DIAGNOSIS — D709 Neutropenia, unspecified: Secondary | ICD-10-CM | POA: Diagnosis not present

## 2019-02-14 DIAGNOSIS — N289 Disorder of kidney and ureter, unspecified: Secondary | ICD-10-CM | POA: Diagnosis not present

## 2019-02-14 LAB — TROPONIN I: Troponin I.cardiac:MCnc:Pt:Ser/Plas:Qn:: <0.034

## 2019-02-14 LAB — BASIC METABOLIC PANEL
ANION GAP: 3 mmol/L — ABNORMAL LOW (ref 7–15)
BLOOD UREA NITROGEN: 17 mg/dL (ref 7–21)
BUN / CREAT RATIO: 25
CALCIUM: 8.2 mg/dL — ABNORMAL LOW (ref 8.5–10.2)
CHLORIDE: 92 mmol/L — ABNORMAL LOW (ref 98–107)
CO2: 40 mmol/L (ref 22.0–30.0)
CREATININE: 0.69 mg/dL — ABNORMAL LOW (ref 0.70–1.30)
EGFR CKD-EPI AA MALE: >90 mL/min/{1.73_m2} (ref >=60)
EGFR CKD-EPI NON-AA MALE: >90 mL/min/{1.73_m2} (ref >=60)
GLUCOSE RANDOM: 112 mg/dL (ref 70–179)
SODIUM: 135 mmol/L (ref 135–145)

## 2019-02-14 LAB — COMPREHENSIVE METABOLIC PANEL
ALBUMIN: 2.6 g/dL — ABNORMAL LOW (ref 3.5–5.0)
ALBUMIN: 2.6 g/dL — ABNORMAL LOW (ref 3.5–5.0)
ALKALINE PHOSPHATASE: 62 U/L (ref 38–126)
ALKALINE PHOSPHATASE: 58 U/L (ref 38–126)
ALT (SGPT): 26 U/L (ref <50)
ALT (SGPT): 22 U/L (ref <50)
ANION GAP: 3 mmol/L — ABNORMAL LOW (ref 7–15)
ANION GAP: 6 mmol/L — ABNORMAL LOW (ref 7–15)
AST (SGOT): 39 U/L (ref 19–55)
AST (SGOT): 32 U/L (ref 19–55)
BILIRUBIN TOTAL: 1.4 mg/dL — ABNORMAL HIGH (ref 0.0–1.2)
BILIRUBIN TOTAL: 1.3 mg/dL — ABNORMAL HIGH (ref 0.0–1.2)
BLOOD UREA NITROGEN: 18 mg/dL (ref 7–21)
BLOOD UREA NITROGEN: 22 mg/dL — ABNORMAL HIGH (ref 7–21)
BUN / CREAT RATIO: 27
BUN / CREAT RATIO: 32
CALCIUM: 8.2 mg/dL — ABNORMAL LOW (ref 8.5–10.2)
CALCIUM: 8 mg/dL — ABNORMAL LOW (ref 8.5–10.2)
CHLORIDE: 94 mmol/L — ABNORMAL LOW (ref 98–107)
CHLORIDE: 92 mmol/L — ABNORMAL LOW (ref 98–107)
CO2: 37 mmol/L — ABNORMAL HIGH (ref 22.0–30.0)
CO2: 37 mmol/L — ABNORMAL HIGH (ref 22.0–30.0)
CREATININE: 0.67 mg/dL — ABNORMAL LOW (ref 0.70–1.30)
CREATININE: 0.69 mg/dL — ABNORMAL LOW (ref 0.70–1.30)
EGFR CKD-EPI AA MALE: >90 mL/min/{1.73_m2} (ref >=60)
EGFR CKD-EPI NON-AA MALE: >90 mL/min/{1.73_m2} (ref >=60)
EGFR CKD-EPI NON-AA MALE: >90 mL/min/{1.73_m2} (ref >=60)
GLUCOSE RANDOM: 100 mg/dL (ref 70–179)
GLUCOSE RANDOM: 104 mg/dL (ref 70–179)
POTASSIUM: 4.1 mmol/L (ref 3.5–5.0)
PROTEIN TOTAL: 5.4 g/dL — ABNORMAL LOW (ref 6.5–8.3)
PROTEIN TOTAL: 5.1 g/dL — ABNORMAL LOW (ref 6.5–8.3)
SODIUM: 135 mmol/L (ref 135–145)

## 2019-02-14 LAB — CBC
HEMATOCRIT: 19.6 % — ABNORMAL LOW (ref 41.0–53.0)
HEMATOCRIT: 19.4 % — ABNORMAL LOW (ref 41.0–53.0)
HEMOGLOBIN: 6.4 g/dL — ABNORMAL LOW (ref 13.5–17.5)
HEMOGLOBIN: 6.4 g/dL — ABNORMAL LOW (ref 13.5–17.5)
MEAN CORPUSCULAR HEMOGLOBIN CONC: 32.7 g/dL (ref 31.0–37.0)
MEAN CORPUSCULAR HEMOGLOBIN: 31.5 pg (ref 26.0–34.0)
MEAN CORPUSCULAR HEMOGLOBIN: 31.9 pg (ref 26.0–34.0)
MEAN CORPUSCULAR VOLUME: 96.4 fL (ref 80.0–100.0)
MEAN CORPUSCULAR VOLUME: 96.3 fL (ref 80.0–100.0)
MEAN PLATELET VOLUME: 11.9 fL — ABNORMAL HIGH (ref 7.0–10.0)
MEAN PLATELET VOLUME: 12.7 fL — ABNORMAL HIGH (ref 7.0–10.0)
NUCLEATED RED BLOOD CELLS: 11 /100{WBCs} — ABNORMAL HIGH (ref <=4)
PLATELET COUNT: 17 10*9/L — ABNORMAL LOW (ref 150–440)
RED BLOOD CELL COUNT: 2.02 10*12/L — ABNORMAL LOW (ref 4.50–5.90)
RED CELL DISTRIBUTION WIDTH: 19 % — ABNORMAL HIGH (ref 12.0–15.0)
RED CELL DISTRIBUTION WIDTH: 18.7 % — ABNORMAL HIGH (ref 12.0–15.0)
WBC ADJUSTED: 1.4 10*9/L — ABNORMAL LOW (ref 4.5–11.0)
WBC ADJUSTED: 1.3 10*9/L — ABNORMAL LOW (ref 4.5–11.0)

## 2019-02-14 LAB — CBC W/ AUTO DIFF
BASOPHILS ABSOLUTE COUNT: 0.1 10*9/L (ref 0.0–0.1)
BASOPHILS RELATIVE PERCENT: 0 %
BASOPHILS RELATIVE PERCENT: 3.1 %
EOSINOPHILS ABSOLUTE COUNT: 0 10*9/L (ref 0.0–0.4)
EOSINOPHILS ABSOLUTE COUNT: 0 10*9/L (ref 0.0–0.4)
EOSINOPHILS RELATIVE PERCENT: 0.7 %
EOSINOPHILS RELATIVE PERCENT: 0.9 %
HEMATOCRIT: 18.7 % — ABNORMAL LOW (ref 41.0–53.0)
HEMATOCRIT: 19.6 % — ABNORMAL LOW (ref 41.0–53.0)
HEMOGLOBIN: 6.2 g/dL — ABNORMAL LOW (ref 13.5–17.5)
HEMOGLOBIN: 6.5 g/dL — ABNORMAL LOW (ref 13.5–17.5)
LARGE UNSTAINED CELLS: 8 % — ABNORMAL HIGH (ref 0–4)
LARGE UNSTAINED CELLS: 8 % — ABNORMAL HIGH (ref 0–4)
LYMPHOCYTES ABSOLUTE COUNT: 0.5 10*9/L — ABNORMAL LOW (ref 1.5–5.0)
LYMPHOCYTES ABSOLUTE COUNT: 0.6 10*9/L — ABNORMAL LOW (ref 1.5–5.0)
LYMPHOCYTES RELATIVE PERCENT: 36.2 %
LYMPHOCYTES RELATIVE PERCENT: 35.1 %
MEAN CORPUSCULAR HEMOGLOBIN CONC: 33.2 g/dL (ref 31.0–37.0)
MEAN CORPUSCULAR HEMOGLOBIN CONC: 33.3 g/dL (ref 31.0–37.0)
MEAN CORPUSCULAR HEMOGLOBIN: 31.9 pg (ref 26.0–34.0)
MEAN CORPUSCULAR HEMOGLOBIN: 31.9 pg (ref 26.0–34.0)
MEAN CORPUSCULAR VOLUME: 96 fL (ref 80.0–100.0)
MEAN CORPUSCULAR VOLUME: 95.8 fL (ref 80.0–100.0)
MEAN PLATELET VOLUME: 12.3 fL — ABNORMAL HIGH (ref 7.0–10.0)
MONOCYTES ABSOLUTE COUNT: 0.1 10*9/L — ABNORMAL LOW (ref 0.2–0.8)
MONOCYTES RELATIVE PERCENT: 11 %
MONOCYTES RELATIVE PERCENT: 10.1 %
NEUTROPHILS ABSOLUTE COUNT: 0.6 10*9/L — ABNORMAL LOW (ref 2.0–7.5)
NEUTROPHILS ABSOLUTE COUNT: 0.7 10*9/L — ABNORMAL LOW (ref 2.0–7.5)
NEUTROPHILS RELATIVE PERCENT: 42.4 %
NUCLEATED RED BLOOD CELLS: 11 /100{WBCs} — ABNORMAL HIGH (ref <=4)
NUCLEATED RED BLOOD CELLS: 7 /100{WBCs} — ABNORMAL HIGH (ref <=4)
PLATELET COUNT: 19 10*9/L — ABNORMAL LOW (ref 150–440)
PLATELET COUNT: 16 10*9/L — ABNORMAL LOW (ref 150–440)
RED BLOOD CELL COUNT: 1.95 10*12/L — ABNORMAL LOW (ref 4.50–5.90)
RED BLOOD CELL COUNT: 2.05 10*12/L — ABNORMAL LOW (ref 4.50–5.90)
RED CELL DISTRIBUTION WIDTH: 18.8 % — ABNORMAL HIGH (ref 12.0–15.0)
RED CELL DISTRIBUTION WIDTH: 18.9 % — ABNORMAL HIGH (ref 12.0–15.0)
WBC ADJUSTED: 1.3 10*9/L — ABNORMAL LOW (ref 4.5–11.0)
WBC ADJUSTED: 1.7 10*9/L — ABNORMAL LOW (ref 4.5–11.0)

## 2019-02-14 LAB — LACTATE DEHYDROGENASE
Lactate dehydrogenase:CCnc:Pt:Ser/Plas:Qn:Reaction: pyruvate to lactate: 1040 — ABNORMAL HIGH
Lactate dehydrogenase:CCnc:Pt:Ser/Plas:Qn:Reaction: pyruvate to lactate: 995 — ABNORMAL HIGH

## 2019-02-14 LAB — D-DIMER QUANTITATIVE (CH,ML,PD,ET): Fibrin D-dimer DDU:MCnc:Pt:PPP:Qn:: 220

## 2019-02-14 LAB — URIC ACID
Urate:MCnc:Pt:Ser/Plas:Qn:: 3.8 — ABNORMAL LOW
Urate:MCnc:Pt:Ser/Plas:Qn:: 4.2

## 2019-02-14 LAB — BLOOD GAS CRITICAL CARE PANEL, VENOUS
BASE EXCESS VENOUS: 12.8 — ABNORMAL HIGH (ref -2.0–2.0)
GLUCOSE WHOLE BLOOD: 111 mg/dL (ref 70–179)
HCO3 VENOUS: 39 mmol/L — ABNORMAL HIGH (ref 22–27)
HEMOGLOBIN BLOOD GAS: 6.2 g/dL — ABNORMAL LOW (ref 13.50–17.50)
LACTATE BLOOD VENOUS: 0.6 mmol/L (ref 0.5–1.8)
O2 SATURATION VENOUS: 53.9 % (ref 40.0–85.0)
PCO2 VENOUS: 74 mmHg (ref 40–60)
PH VENOUS: 7.34 (ref 7.32–7.43)
POTASSIUM WHOLE BLOOD: 3.6 mmol/L (ref 3.4–4.6)
SODIUM WHOLE BLOOD: 135 mmol/L (ref 135–145)

## 2019-02-14 LAB — URINALYSIS
BACTERIA: NONE SEEN /HPF
BILIRUBIN UA: NEGATIVE
BLOOD UA: NEGATIVE
GLUCOSE UA: NEGATIVE
KETONES UA: NEGATIVE
LEUKOCYTE ESTERASE UA: NEGATIVE
NITRITE UA: NEGATIVE
PH UA: 5 (ref 5.0–9.0)
PROTEIN UA: NEGATIVE
SPECIFIC GRAVITY UA: 1.017 (ref 1.003–1.030)
SQUAMOUS EPITHELIAL: <1 /HPF (ref 0–5)
UROBILINOGEN UA: 2 — AB
WBC UA: 1 /HPF (ref <=2)

## 2019-02-14 LAB — PHOSPHORUS
Phosphate:MCnc:Pt:Ser/Plas:Qn:: 2.7 — ABNORMAL LOW
Phosphate:MCnc:Pt:Ser/Plas:Qn:: 3.1

## 2019-02-14 LAB — SODIUM: Sodium:SCnc:Pt:Ser/Plas:Qn:: 135

## 2019-02-14 LAB — FIBRINOGEN: FIBRINOGEN LEVEL: 619 mg/dL — ABNORMAL HIGH (ref 177–386)

## 2019-02-14 LAB — INR: Coagulation tissue factor induced.INR:RelTime:Pt:PPP:Qn:Coag: 1.26

## 2019-02-14 LAB — MAGNESIUM
MAGNESIUM: 1.7 mg/dL (ref 1.6–2.2)
Magnesium:MCnc:Pt:Ser/Plas:Qn:: 1.7
Magnesium:MCnc:Pt:Ser/Plas:Qn:: 1.8

## 2019-02-14 LAB — SMEAR REVIEW

## 2019-02-14 LAB — RED CELL DISTRIBUTION WIDTH: Lab: 18.9 — ABNORMAL HIGH

## 2019-02-14 LAB — MEAN CORPUSCULAR HEMOGLOBIN: Lab: 31.9

## 2019-02-14 LAB — POTASSIUM: Potassium:SCnc:Pt:Ser/Plas:Qn:: 3.8

## 2019-02-14 LAB — SLIDE REVIEW

## 2019-02-14 LAB — WBC ADJUSTED
Leukocytes:NCnc:Pt:Bld:Qn:: 1.3 — ABNORMAL LOW
Leukocytes:NCnc:Pt:Bld:Qn:: 1.4 — ABNORMAL LOW

## 2019-02-14 LAB — MUCUS

## 2019-02-14 LAB — HEPARIN CORRELATION: Lab: 0.2

## 2019-02-14 LAB — CREATININE: Creatinine:MCnc:Pt:Ser/Plas:Qn:: 0.67 — ABNORMAL LOW

## 2019-02-14 LAB — LACTATE BLOOD VENOUS: Lactate:SCnc:Pt:BldV:Qn:: 0.6

## 2019-02-14 LAB — FIBRINOGEN LEVEL: Fibrinogen:MCnc:Pt:PPP:Qn:Coag: 619 — ABNORMAL HIGH

## 2019-02-14 MED ORDER — ISOSORBIDE MONONITRATE ER 30 MG PO TB24
30.00 | ORAL_TABLET | ORAL | Status: DC
Start: 2019-02-18 — End: 2019-02-14

## 2019-02-14 MED ORDER — LACTULOSE 10 GM/15ML PO SOLN
20.00 | ORAL | Status: DC
Start: 2019-02-14 — End: 2019-02-14

## 2019-02-14 MED ORDER — CEFEPIME HCL 2 GM/100ML IV SOLN
2.00 | INTRAVENOUS | Status: DC
Start: 2019-02-14 — End: 2019-02-14

## 2019-02-14 MED ORDER — IPRATROPIUM-ALBUTEROL 0.5-2.5 (3) MG/3ML IN SOLN
3.00 | RESPIRATORY_TRACT | Status: DC
Start: 2019-02-17 — End: 2019-02-14

## 2019-02-14 MED ORDER — GENERIC EXTERNAL MEDICATION
Status: DC
Start: ? — End: 2019-02-14

## 2019-02-14 MED ORDER — ACETAMINOPHEN 325 MG PO TABS
650.00 | ORAL_TABLET | ORAL | Status: DC
Start: ? — End: 2019-02-14

## 2019-02-14 MED ORDER — PREDNISONE 20 MG PO TABS
40.00 | ORAL_TABLET | ORAL | Status: DC
Start: 2019-02-18 — End: 2019-02-14

## 2019-02-14 NOTE — Unmapped (Signed)
Patient was compliant with his bipap this shift, settings were BiPAP: 12/8/8rr/40%. Will continue to monitor.

## 2019-02-14 NOTE — Unmapped (Signed)
02/14/19.Raymond Kitchenthe patient has only 1 ABX..has one good PIV and very insistent that he wants PIVs were there are no veins.  Order discontinued right now..will return later if additional access is needed.

## 2019-02-14 NOTE — Unmapped (Signed)
ARRT called on pt for sats in the 70s. Multiple calls from telemetry. Pt satting 82-89% on 4L [place], and then dipping down to 73%. Pt assigned bed in [facility] and report called to Morrie Sheldon, Charity fundraiser.

## 2019-02-14 NOTE — Unmapped (Signed)
Informed Blood Consent    I have advised Raymond [name] of his medical condition, and that the chances for his improvement or recovery will be significantly helped by receiving blood products by transfusion:  Such as packed red blood cells, fresh frozen plasma, platelets or cryoprecipitate (blood products).    I have explained the benefits that are expected from the patient being transfused and, as well, the risk.  The patient understands that although the blood products to be administered had been prepared and tested in accordance with strict scientific rules established by the [employer] Association of Blood Banks, there is still a very small - approximately 1 and 70,000 for Hemolytic Reactions and approximately 1 and 190,000 for TRALI - chance of blood products could be incompatible with the patient's body and a transfusion reaction can occur.  Although transfusion reactions can be treated successfully, the patient understands that on rare occasions they can be fatal (1 in 250,000 transfusions).  The patient also understands that allergic reactions to blood products with hives, itching, and fever are more common but can be treated and may not even require the transfusion to be stopped.  The patient understands that even with testing by the most up-to-date methods, there is a small chance that the blood products may contain a virus that may not be recognized as an infection for many months or years.  Even with proper testing, I discussed with the patient that the chance for contracting viral Hepatitis B, Hepatitis C or HIV is approximately 1 in 2 million.    I gave the patient an opportunity to ask questions regarding the transfusion of blood products and I answered those questions to the patient's satisfaction.    ______________________________________________________________________  Instructions for Nursing:  Nurse will indicate at the top of the consent form (page 1) the portions in BOLD:  1.  I authorize Dr. Doneen Poisson and/or associates and assistants of his/her choice at [place] [facility] (referred to herein as 'facility') to perform the following procedure(s): Transfusion of Blood Products.    The patient should place his initials in the area under #4 indicating Authorization of blood products.    The patient's signature (page 2) will be obtained by nursing staff on the consent form and the nurse will verify this consent under the Witness Certification (page 2).  The paper form will subsequently be placed in the patient's physical chart (later to be scanned) as further evidence that the patient has given consent to the administration of blood products.    Raymond Church FNP-C

## 2019-02-14 NOTE — Unmapped (Signed)
Patient on 3L of O2 via [place] to maintain sats in high 80s/low 90s; several calls from telemetry for desats when patient removes own . Up to bathroom with standby assist. Voiding. No BM this shift; lactulose given. Other VSS with no falls or injuries. Given tylenol x1 for acute pain in R ear. No other issues. Will CTM.     Problem: Adult Inpatient Plan of Care  Goal: Plan of Care Review  Outcome: Ongoing - Unchanged  Goal: Patient-Specific Goal (Individualization)  Outcome: Ongoing - Unchanged  Goal: Absence of Hospital-Acquired Illness or Injury  Outcome: Ongoing - Unchanged  Goal: Optimal Comfort and Wellbeing  Outcome: Ongoing - Unchanged  Goal: Readiness for Transition of Care  Outcome: Ongoing - Unchanged  Goal: Rounds/Family Conference  Outcome: Ongoing - Unchanged

## 2019-02-14 NOTE — Unmapped (Signed)
Pt arrived as a rapid response from 4onc A&Ox4. Tmax 38.2, BC ordered and obtained, O2 sats >90% on 40% bipap. x1 unit PRBC's infused this AM. No c/o pain. UO adequate, no BM this shift. Pt's appetite has been adequate. Skin intact, independent turns and standard precautions maintained. No falls/injuries this shift. All monitors with appropriate alarm settings, call bell within reach, will continue to monitor patient.       Problem: Adult Inpatient Plan of Care  Goal: Plan of Care Review  Outcome: Not Progressing  Goal: Patient-Specific Goal (Individualization)  Outcome: Not Progressing  Goal: Absence of Hospital-Acquired Illness or Injury  Outcome: Not Progressing  Goal: Optimal Comfort and Wellbeing  Outcome: Not Progressing  Goal: Readiness for Transition of Care  Outcome: Not Progressing  Goal: Rounds/Family Conference  Outcome: Not Progressing     Problem: Infection  Goal: Infection Symptom Resolution  Outcome: Not Progressing     Problem: COPD Comorbidity  Goal: Maintenance of COPD Symptom Control  Outcome: Not Progressing     Problem: Hypertension Comorbidity  Goal: Blood Pressure in Desired Range  Outcome: Not Progressing     Problem: Wound  Goal: Optimal Wound Healing  Outcome: Not Progressing     Problem: Self-Care Deficit  Goal: Improved Ability to Complete Activities of Daily Living  Outcome: Not Progressing     Problem: Fall Injury Risk  Goal: Absence of Fall and Fall-Related Injury  Outcome: Not Progressing     Problem: Skin Injury Risk Increased  Goal: Skin Health and Integrity  Outcome: Not Progressing

## 2019-02-14 NOTE — Unmapped (Signed)
Daily Progress Note        Principal Problem:    Acute myeloid leukemia not having achieved remission (CMS-HCC)  Active Problems:    CAD (coronary artery disease)    Hyperlipidemia    Hypertension    Arthritis    Asthma    Diabetes mellitus (CMS-HCC)    Other psoriasis    Intellectual disability       LOS: 3 days     Assessment/Plan: Raymond [name] is a 63 y.o. male with newly diagnosed AML and PMHx of NSTEMI/CAD s/t stents, DMT2, HLD, HTN, COPD, depression/anxiety, mild cognitive impairment , who is admitted for initiation of tx with Azacitidine and Venetoclax.    Plan Summary: C1D4=02/14/19 Aza/Ven. Hx COPD (on home 2L [place]), refusing home CPAP HS. Rapid called overnight for desat's 70s, though exam w/only mild dyspnea, transitioned to [facility] for BIPAP (wore for about 4 hours), transitioned to 5L [place] this AM, sp02 upper 90's. S/p 20 mg Lasix IVP, CT chest with small bilateral pleural effusions, small pericardial effusion, otherwise negative. Net neg 3L, weight slightly down from admission, BLE swelling improved. Likely COPD exacerbation vs fluid overload. VBG with compensated Ph 7.34, chronic c02 retention in 70s. Started on 40 mg Prednisone daily x 5 days, Duo-neb Q4H scheduled. Fever overnight, Tmax currently 38.3, Cefepime started. CT Abdomen/pelvis w/con ordered, pending. BCx pending, UA negative, urine Cx pending. No clear source currently. He does have red scabbed areas to face, look improved on exam, holding off on Vanc. Constipated on admission KUB, on Lactulose TID till BM. Hx GIB, denies melena or BRBPR. Abdominal exam benign today. Has been requiring blood transfusion support almost daily, 2 units of pRBCs today. On metoprolol/amlodipine, holding ASA/Plavix given bleeding risk (Hx STEMI, stents, CAD), holding statin/ACE given DDI. Cardiac work up overnight was negative. Imdur was added yesterday for brief chest pain (likely 2/2 known CAD) episode (work up also negative). SSI d/c today given no coverage needed since admission. Patient has lifelong intellectual disability and will be relocating to live with his sister [name] (in [place]) upon discharge (her number is [phone]). Touched base with [name], he has been referred to [facility] but has yet to get established (has appt w/Dr. Glade Nurse on 11/6 at 1pm, will need to be seen prior to being allowed to have labs/support scheduled there). Possible discharge Monday 10/12 (Day 7) if stable over the weekend, he has labs and follow up w/Dr. Vertell Limber 10/15 and appt w/Buhlinger (D15) on 10/19. Requested 2x/weekly labs with support starting 10/19, awaiting scheduling. [name], CM setting up [facility] medication management in [place] area. [name] (sister) to pick up his 02 prior to picking up patient after discharged so its available for drive home.     AML, with MRC, complex karyotype, TP53 mutation: Presented to PCP with abd pain and fatigue, labs revealed pancytopenia. Diagnostic BMBx confirmed AML with TP53 mutation.  Multiple tx regimens were discussed and decided to proceed with Azacitidine and Venetoclax. Plan for BMBx s/p C1.  - C1D1=10/6   - Azacitidine 75 mg/m2 SubQ daily, D1-7 inpatient   - Venetoclax PO daily: 100 mg D1, 200 mg D2, 400 mg D3-7, then 100 mg D8 and forward (d/t posaconazole start)   - Ppx: Valtrex (holding Levaquin while on Cefepime), Plan to start Posaconazole D8  - Allopurinol 300 daily  - IVF boluses per tx plan  - CBC daily, TLS labs Q12H  - Transfuse RBCs if hemoglobin < 7, platelets if < 10K -  MANUAL transfusions  [ ]  f/u admission COVID screen (patient has refused numerous times despite education and risks/benefits discussion)  ??  Disposition  - 10/15 Labs at 2:30pm followed by 3:30pm appt w/Dr. Vertell Limber  - 10/19 Labs 12pm followed by appt w/[name] at 1 pm (D15 appointment)  - Requested labs with transfusion support 2x/weekly starting 10/19 (careful not to duplicate appointments), awaiting scheduling   - [name] coordinating [facility] in [place] area for medication management     COPD Exacerbation: Reported chronic condition. Uses Symbicort and albuterol inhalers intermittently. Reports recent addition of home O2 due to low O2 level, not needing on admission.   - CXR on admission with generally clear lungs with possible mild edema  - Continue Symbicort (or formulary alternative)  - 10/9 Rapid called overnight for desat's 70s, though exam w/only mild dyspnea, transitioned to [facility] for BIPAP (worse for about 4 hours), transitioned to 5L [place] this AM, sp02 upper 90's. S/p 20 mg Lasix IVP, CT chest with small bilateral pleural effusions, small pericardial effusion, otherwise negative. Net neg 3L, weight slightly down from admission, BLE swelling improved. Likely COPD exacerbation vs fluid overload. VBG with compensated Ph 7.34, chronic c02 retention in 70s. Started on 40 mg Prednisone daily x 5 days, Duo-neb Q4H scheduled  - Lasix 40 mg Daily   - daily weights, strict I/O   - IS ordered   [ ]  Admission COVID screen (patient continues to refuse)  - Sister [name]) to pick up patients 02 in [place] prior to picking him up on day of discharge, she is aware tentative d/c date is 10/12    Non-neutropenic Fever: Developed fever overnight on 02/13/19 38.2 that resolved spontaneously. Fevered again to 38.9. Cefepime started, work up ordered.  - Cefepime 2 G Q8H (10/9-)  - [ ]  10/9 Blood Cultures, pending   - [ ]  UA/Urine cultures, needs to be collected  - 10/9 CT Chest: No acute airspace disease. Trace pericardial and trace bilateral pleural effusions. Multiple prominent lymph nodes throughout the supraclavicular region and mediastinum measuring 1.3 cm, which may be compatible with newly diagnosed AML.  - Patient denies cough, SOB, sore throat, dysuria, abdominal pain or diarrhea today  - Patient has multiple healing red scabbed areas to face, appears better on exam today, holding off on Vanc at this time, low threshold to start if becomes unstable  - [ ]  10/9 CT Abdomen/Pelvis with constrast: pending     Possible GIB: Months long hx of abd pain and dark stools, likely confounded by AML diagnosis and pancytopenia, requiring intermittent blood product transfusions. Pt has also noted BRBPR recently, and remote hx of hemorrhoids. Home med list includes protonix, ranitidine, bentyl, carafate. Follows with GI locally?  - KUB on admission with nonobstructive bowel pattern and moderate stool burden  - Monitor for melana/BRBPR  - Per attending hold off on FOBT  - Transfuse blood/platelets as needed  - Continue Protonix and Carafate  - Low threshold to consult GI should melana/BRBPR occur  - Bowel regimen PRN  - On scheduled Miralax, Senna, Colace  - Lactulose 20 g TID until BM today  - [ ]  10/9 CT Abdomen/Pelvis w/constrast: pending     Hx of Psorasis: Patient has BUE psoriasis changes noted as well as bilteral facial scab areas that he notes are itchy at times, suspect patient is picking at his face.   - Cetirizine daily added  - Eucerin cream added  - No drainage, erythema or other c/f infection noted  -  CTM    CAD, HTN, HLD, s/p STEMI (2010), with stent placement (2010, 2017): Chronic conditions, follows with [facility]ology?. Reportedly, most recent cardiac cath in 2019 revealed normal LV function, and known stents. Pt uncertain of current medication regimen, but home med list includes: CCB, ACE, diuretics, BB, vasodilators and nitrates, statin. States he has been out of many meds recently and is unsure what he has been taking. States Plavix and Aspirin have been stopped since he was directed to stop these by Dr. Vertell Limber at recent outpatient visit. EKG on admission was NSR. He has not needed NTG tabs lately.   - Continue home Amlodipine  - Continue home Metoprolol  - Can titrate above meds as needed  - HOLD home ACE, diuretics, and statin d/t TLS risk and DDIs with chemo  - HOLD Plavix/ASA until thrombocytopenia resolution  - NTG PRN  - 10/8 Imdur 30 mg added, patient c/o episode of chest pain ON 10/7 (not called to night coverage) that resolved on its own, EKG NSR, no change from prior. Of note we are holding dual antiplatelet therapy and his antianginals.   ??  DMT2: Chronic condition. Home med list included Metformin.  - Accuchecks 4x/daily  - SSI d/c on 10/9 given patient has not required any coverage since admission  ??  Pain: Describes as arthritic pain in knees and hands, intermittent frontal headaches. States he needs surgery on his right knee.  - Tramadol 25-50 mg for mod-severe pain per pain scale  - Voltaren gel to knees scheduled 4x/daily  - Tylenol PRN  ??  Anxiety depression: chronic condition. Home med list included Celexa, Cymbalta  - Continue home Celexa  - QTcF on admission 403  ??  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 Recently living at home alone, however planning to move in with sister after hospital d/c, for support while undergoing tx. WIll need local f/u arranged at [facility] in [facility].   - Pharmacy to review home meds to consolidate prior to d/c  - Psych consulted given patients frequent refusal of care, patient refusing HCPOA designation paperwork      DVT Ppx: none d/t thrombocytopenia, encourage ambulation  FEN:  - IV fluids per chemo orders  - Replete electrolytes as needed, monitoring for TLS  - Regular diet  ??  Need for PT: No  Anticipated Discharge Date: 10/12  ??  Code Status: Full code  ??  Subjective:     Interval History: Patient tearful at times, wants to go home. Able to be re-directed. Denies any chills, cough, congestion. Denies feeling SOB or labored breathing. Feels his swelling is getting better. He denies any melena or BRBPR, denies abdominal pain. On lactulose till BM, so far not successful. Appetite is stable, no N/V. Denies any chest pain today.     10 point ROS otherwise negative except as above in the HPI.     Objective:     Vital signs in last 24 hours:  Temp:  [36.4 ??C (97.5 ??F)-38.3 ??C (100.9 ??F)] 38.3 ??C (100.9 ??F)  Heart Rate:  [86-104] 97  SpO2 Pulse:  [86-97] 97  Resp:  [17-28] 20  BP: (114-141)/(33-56) 130/51  MAP (mmHg):  [60-75] 66  FiO2 (%):  [40 %-100 %] 40 %  SpO2:  [73 %-100 %] 94 %    Intake/Output last 3 shifts:  I/O last 3 completed shifts:  In: 120 [P.O.:120]  Out: 2700 [Urine:2700]    Meds:  Current Facility-Administered Medications  Medication Dose Route Frequency Provider Last Rate Last Dose   ??? acetaminophen (TYLENOL) tablet 650 mg  650 mg Oral Q6H PRN Janyth Contes, FNP       ??? albuterol 2.5 mg /3 mL (0.083 %) nebulizer solution 2.5 mg  2.5 mg Nebulization Q6H PRN Janyth Contes, FNP   2.5 mg at 02/13/19 2240   ??? allopurinoL (ZYLOPRIM) tablet 300 mg  300 mg Oral Daily Janyth Contes, FNP   300 mg at 02/14/19 5409   ??? amLODIPine (NORVASC) tablet 5 mg  5 mg Oral Daily Janyth Contes, [place]   5 mg at 02/14/19 0846   ??? azaCITIDine (VIDAZA) syringe 164.25 mg  75 mg/m2 (Order-Specific) Subcutaneous Q22H Janyth Contes, [place]   164.25 mg at 02/13/19 1832   ??? cefepime (MAXIPIME) 2 g in dextrose 100 mL IVPB (premix)  2 g Intravenous Q8H Gabriela Sarita Clover Mealy, NP 200 mL/hr at 02/14/19 0947 2 g at 02/14/19 0947   ??? cetirizine (ZyrTEC) tablet 10 mg  10 mg Oral Daily Janyth Contes, [place]   10 mg at 02/14/19 0846   ??? CHEMO CLARIFICATION ORDER   Other Continuous PRN Janyth Contes, FNP       ??? citalopram (CeleXA) tablet 20 mg  20 mg Oral Daily Janyth Contes, FNP   20 mg at 02/14/19 0846   ??? diclofenac sodium (VOLTAREN) 1 % gel 2 g  2 g Topical 4x Daily Janyth Contes, FNP   2 g at 02/13/19 1627   ??? diphenhydrAMINE (BENADRYL) injection 25 mg  25 mg Intravenous Q4H PRN Janyth Contes, FNP       ??? docusate sodium (COLACE) capsule 100 mg  100 mg Oral Daily Janyth Contes, FNP   100 mg at 02/14/19 0845   ??? EPINEPHrine (EPIPEN) injection 0.3 mg  0.3 mg Intramuscular Daily PRN Janyth Contes, FNP       ??? famotidine (PF) (PEPCID) injection 20 mg  20 mg Intravenous Q4H PRN Janyth Contes, FNP       ??? fluticasone furoate-vilanteroL (BREO ELLIPTA) 200-25 mcg/dose inhaler 1 puff  1 puff Inhalation Daily (RT) Janyth Contes, FNP   1 puff at 02/14/19 0746   ??? furosemide (LASIX) tablet 40 mg  40 mg Oral Daily Janyth Contes, FNP   40 mg at 02/14/19 0846   ??? IP OKAY TO TREAT   Other Continuous PRN Janyth Contes, FNP       ??? ipratropium-albuteroL (DUO-NEB) 0.5-2.5 mg/3 mL nebulizer solution 3 mL  3 mL Nebulization Q4H (RT) Gabriela Launa Grill, NP       ??? isosorbide mononitrate (IMDUR) 24 hr tablet 30 mg  30 mg Oral Daily Janyth Contes, FNP   30 mg at 02/14/19 0844   ??? lactulose (CHRONULAC) oral solution (30 mL cup)  20 g Oral TID Janyth Contes, FNP   20 g at 02/14/19 0844   ??? loperamide (IMODIUM) capsule 2 mg  2 mg Oral Q2H PRN Janyth Contes, FNP       ??? loperamide (IMODIUM) capsule 4 mg  4 mg Oral Once PRN Janyth Contes, FNP       ??? meperidine (DEMEROL) injection 25 mg  25 mg Intravenous Q30 Min PRN Janyth Contes, [place]       ??? methylPREDNISolone sodium succinate (PF) (Solu-MEDROL) injection 125 mg  125 mg Intravenous Q4H PRN Janyth Contes, FNP       ???  metoprolol succinate (TOPROL-XL) 24 hr tablet 25 mg  25 mg Oral Daily Janyth Contes, FNP   25 mg at 02/13/19 1751   ??? nitroglycerin (NITROSTAT) SL tablet 0.4 mg  0.4 mg Sublingual Q5 Min PRN Janyth Contes, FNP       ??? ondansetron [facility]) injection 8 mg  8 mg Intravenous Once PRN Janyth Contes, FNP       ??? ondansetron [facility]) tablet 16 mg  16 mg Oral Q22H Janyth Contes, FNP   16 mg at 02/13/19 1751   ??? pantoprazole (PROTONIX) EC tablet 40 mg  40 mg Oral Daily Janyth Contes, [place]   40 mg at 02/14/19 1610   ??? polyethylene glycol (MIRALAX) packet 17 g  17 g Oral Daily Janyth Contes, [place]   17 g at 02/14/19 9604   ??? predniSONE (DELTASONE) tablet 40 mg  40 mg Oral Daily Gabriela Sarita Clover Mealy, NP       ??? prochlorperazine (COMPAZINE) tablet 5 mg  5 mg Oral Q6H PRN Janyth Contes, FNP   5 mg at 02/14/19 0026   ??? senna (SENOKOT) tablet 2 tablet  2 tablet Oral Nightly Janyth Contes, FNP   2 tablet at 02/13/19 2046   ??? sevelamer (RENVELA) tablet 800 mg  800 mg Oral 3xd Meals Janyth Contes, FNP   800 mg at 02/14/19 0845   ??? sodium chloride (NS) 0.9 % infusion  20 mL/hr Intravenous Continuous PRN Janyth Contes, FNP       ??? sodium chloride 0.9% (NS) bolus 1,000 mL  1,000 mL Intravenous Daily PRN Janyth Contes, FNP       ??? sucralfate (CARAFATE) oral suspension  1 g Oral ACHS Janyth Contes, [place]   1 g at 02/14/19 5409   ??? traMADoL (ULTRAM) tablet 25 mg  25 mg Oral Q6H PRN Janyth Contes, FNP        Or   ??? traMADoL Janean Sark) tablet 50 mg  50 mg Oral Q6H PRN Janyth Contes, FNP   50 mg at 02/13/19 0145   ??? valACYclovir (VALTREX) tablet 500 mg  500 mg Oral Daily Janyth Contes, FNP   500 mg at 02/14/19 8119   ??? venetoclax (VENCLEXTA) tablet 400 mg  400 mg Oral Daily Janyth Contes, FNP   400 mg at 02/13/19 1753   ??? white petrolatum-mineral oil-lanolin topical cream   Topical TID PRN Janyth Contes, FNP           Physical Exam:  General: Resting, in no apparent distress, lying in bed  HEENT:  PERRL. No scleral icterus or conjunctival injection. MMM without ulceration, erythema or exudate. No cervical or axillary lymphadenopathy.   Heart:  RRR. S1, S2. No murmurs, gallops, or rubs.  Lungs:  Breathing is unlabored, and patient is speaking full sentences with ease. Very diminished air movement throughout on exam, no audible wheeze  Abdomen:  No distention or pain on palpation.  Bowel sounds are present and normoactive x 4.  No palpable hepatomegaly or splenomegaly.  No palpable masses.   Skin: Warm to touch, dry. Facial scabbed areas bilaterally, no drainage, itchy at times per patient, psoriasis to BUE  Musculoskeletal:  No grossly-evident joint effusions or deformities.  Range of motion about the shoulder, elbow, hips and knees is grossly normal.  Psychiatric:  Range of affect is appropriate.    Neurologic:  Alert and oriented to person, place, time and situation.  Gait is normal.  Extremities:  Appear well-perfused. No clubbing, edema, or cyanosis.  CVAD: PIV, C/D/I      Labs:  Recent Labs     02/12/19  0555  02/13/19  0538 02/13/19  1641 02/14/19  0129 02/14/19  0130 02/14/19  0131 02/14/19  0537   WBC 2.4*  --  1.7*  --   --   --  1.4* 1.3*   NEUTROABS 0.9*  --  0.7*  --   --   --   --  0.6*   LYMPHSABS 1.2*  --  0.6*  --   --   --   --  0.5*   HGB 7.0*  --  7.0*  --   --   --  6.4* 6.2*   HCT 21.7*  --  21.0*  --   --   --  19.6* 18.7*   PLT 20*  --  19*  --   --   --  17* 19*   CREATININE 0.61*   < > 0.65* 0.62*  --  0.69*  --  0.67*   BUN 14   < > 17 17  --  17  --  18   BILITOT 0.5   < > 1.0 1.4*  --   --   --  1.4*   AST 28   < > 38 38  --   --   --  39   ALT 11   < > 20 21  --   --   --  26   ALKPHOS 53   < > 60 60  --   --   --  62   K 3.6   < > 4.1 3.7 3.6 3.8  --  4.1   MG 1.6  --  1.6  --   --  1.8  --  1.7   CALCIUM 7.8*   < > 7.6* 8.1*  --  8.2*  --  8.2*   NA 133*   < > 134* 134* 135 135  --  134*   CL 94*   < > 93* 92*  --  92*  --  94*   CO2 31.0*   < > 33.0* 39.0*  --  40.0*  --  37.0*   PHOS 5.6*   < > 4.0 3.3  --   --   --  3.1   INR  --   --   --   --   --   --   --  1.26    < > = values in this interval not displayed.       Imaging:  CXR reviewed per EMR  CT chest reviewed per EMR  CT A/P pending, will review    Julieanne Cotton, ACNP-BC  02/14/2019  10:37 AM   Nurse Practitioner  Hematology/Oncology Department   [facility]   Group pager: [phone] or [phone]

## 2019-02-15 LAB — CBC W/ AUTO DIFF
BASOPHILS ABSOLUTE COUNT: 0 10*9/L (ref 0.0–0.1)
BASOPHILS RELATIVE PERCENT: 4.5 %
EOSINOPHILS ABSOLUTE COUNT: 0 10*9/L (ref 0.0–0.4)
HEMATOCRIT: 20.8 % — ABNORMAL LOW (ref 41.0–53.0)
HEMATOCRIT: 20.8 % — ABNORMAL LOW (ref 41.0–53.0)
HEMOGLOBIN: 7 g/dL — ABNORMAL LOW (ref 13.5–17.5)
HEMOGLOBIN: 7.1 g/dL — ABNORMAL LOW (ref 13.5–17.5)
LARGE UNSTAINED CELLS: 7 % — ABNORMAL HIGH (ref 0–4)
LYMPHOCYTES ABSOLUTE COUNT: 0.3 10*9/L — ABNORMAL LOW (ref 1.5–5.0)
LYMPHOCYTES RELATIVE PERCENT: 18.9 %
MEAN CORPUSCULAR HEMOGLOBIN CONC: 33.5 g/dL (ref 31.0–37.0)
MEAN CORPUSCULAR HEMOGLOBIN: 32 pg (ref 26.0–34.0)
MEAN CORPUSCULAR HEMOGLOBIN: 32.6 pg (ref 26.0–34.0)
MEAN CORPUSCULAR VOLUME: 95.5 fL (ref 80.0–100.0)
MEAN CORPUSCULAR VOLUME: 95.8 fL (ref 80.0–100.0)
MEAN PLATELET VOLUME: 12.6 fL — ABNORMAL HIGH (ref 7.0–10.0)
MEAN PLATELET VOLUME: 10.3 fL — ABNORMAL HIGH (ref 7.0–10.0)
MONOCYTES ABSOLUTE COUNT: 0.1 10*9/L — ABNORMAL LOW (ref 0.2–0.8)
MONOCYTES RELATIVE PERCENT: 9.5 %
NEUTROPHILS ABSOLUTE COUNT: 0.9 10*9/L — ABNORMAL LOW (ref 2.0–7.5)
NEUTROPHILS RELATIVE PERCENT: 63.6 %
NUCLEATED RED BLOOD CELLS: 5 /100{WBCs} — ABNORMAL HIGH (ref <=4)
NUCLEATED RED BLOOD CELLS: 13 /100{WBCs} — ABNORMAL HIGH (ref <=4)
PLATELET COUNT: 13 10*9/L — ABNORMAL LOW (ref 150–440)
PLATELET COUNT: 46 10*9/L — ABNORMAL LOW (ref 150–440)
RED BLOOD CELL COUNT: 2.18 10*12/L — ABNORMAL LOW (ref 4.50–5.90)
RED BLOOD CELL COUNT: 2.17 10*12/L — ABNORMAL LOW (ref 4.50–5.90)
RED CELL DISTRIBUTION WIDTH: 18.5 % — ABNORMAL HIGH (ref 12.0–15.0)
WBC ADJUSTED: 1.4 10*9/L — ABNORMAL LOW (ref 4.5–11.0)
WBC ADJUSTED: 1 10*9/L — ABNORMAL LOW (ref 4.5–11.0)

## 2019-02-15 LAB — COMPREHENSIVE METABOLIC PANEL
ALBUMIN: 2.9 g/dL — ABNORMAL LOW (ref 3.5–5.0)
ALBUMIN: 2.8 g/dL — ABNORMAL LOW (ref 3.5–5.0)
ALKALINE PHOSPHATASE: 58 U/L (ref 38–126)
ALKALINE PHOSPHATASE: 58 U/L (ref 38–126)
ALT (SGPT): 22 U/L (ref <50)
ALT (SGPT): 21 U/L (ref <50)
ANION GAP: 6 mmol/L — ABNORMAL LOW (ref 7–15)
ANION GAP: 7 mmol/L (ref 7–15)
AST (SGOT): 28 U/L (ref 19–55)
AST (SGOT): 26 U/L (ref 19–55)
BILIRUBIN TOTAL: 1.1 mg/dL (ref 0.0–1.2)
BILIRUBIN TOTAL: 0.9 mg/dL (ref 0.0–1.2)
BLOOD UREA NITROGEN: 30 mg/dL — ABNORMAL HIGH (ref 7–21)
BLOOD UREA NITROGEN: 33 mg/dL — ABNORMAL HIGH (ref 7–21)
BUN / CREAT RATIO: 46
BUN / CREAT RATIO: 43
CALCIUM: 8.4 mg/dL — ABNORMAL LOW (ref 8.5–10.2)
CHLORIDE: 92 mmol/L — ABNORMAL LOW (ref 98–107)
CHLORIDE: 93 mmol/L — ABNORMAL LOW (ref 98–107)
CO2: 38 mmol/L — ABNORMAL HIGH (ref 22.0–30.0)
CO2: 35 mmol/L — ABNORMAL HIGH (ref 22.0–30.0)
CREATININE: 0.65 mg/dL — ABNORMAL LOW (ref 0.70–1.30)
CREATININE: 0.77 mg/dL (ref 0.70–1.30)
EGFR CKD-EPI AA MALE: >90 mL/min/{1.73_m2} (ref >=60)
EGFR CKD-EPI AA MALE: >90 mL/min/{1.73_m2} (ref >=60)
EGFR CKD-EPI NON-AA MALE: >90 mL/min/{1.73_m2} (ref >=60)
EGFR CKD-EPI NON-AA MALE: >90 mL/min/{1.73_m2} (ref >=60)
GLUCOSE RANDOM: 148 mg/dL (ref 70–179)
GLUCOSE RANDOM: 187 mg/dL — ABNORMAL HIGH (ref 70–179)
POTASSIUM: 4.7 mmol/L (ref 3.5–5.0)
POTASSIUM: 4.4 mmol/L (ref 3.5–5.0)
PROTEIN TOTAL: 5.7 g/dL — ABNORMAL LOW (ref 6.5–8.3)
PROTEIN TOTAL: 5.6 g/dL — ABNORMAL LOW (ref 6.5–8.3)
SODIUM: 136 mmol/L (ref 135–145)
SODIUM: 135 mmol/L (ref 135–145)

## 2019-02-15 LAB — BILIRUBIN TOTAL: Bilirubin:MCnc:Pt:Ser/Plas:Qn:: 1.1

## 2019-02-15 LAB — MANUAL DIFFERENTIAL
BASOPHILS - ABS (DIFF): 0 10*9/L (ref 0.0–0.1)
BASOPHILS - REL (DIFF): 3 %
BLASTS - REL (DIFF): 4 % (ref <=0)
EOSINOPHILS - REL (DIFF): 0 %
LYMPHOCYTES - ABS (DIFF): 0.2 10*9/L — ABNORMAL LOW (ref 1.5–5.0)
LYMPHOCYTES - REL (DIFF): 22 %
MONOCYTES - ABS (DIFF): 0.1 10*9/L — ABNORMAL LOW (ref 0.2–0.8)
MONOCYTES - REL (DIFF): 6 %
NEUTROPHILS - ABS (DIFF): 0.7 10*9/L — ABNORMAL LOW (ref 2.0–7.5)

## 2019-02-15 LAB — NUCLEATED RED BLOOD CELLS: Lab: 13 — ABNORMAL HIGH

## 2019-02-15 LAB — SODIUM: Sodium:SCnc:Pt:Ser/Plas:Qn:: 135

## 2019-02-15 LAB — LACTATE DEHYDROGENASE
LACTATE DEHYDROGENASE: 1010 U/L — ABNORMAL HIGH (ref 338–610)
Lactate dehydrogenase:CCnc:Pt:Ser/Plas:Qn:Reaction: pyruvate to lactate: 1010 — ABNORMAL HIGH
Lactate dehydrogenase:CCnc:Pt:Ser/Plas:Qn:Reaction: pyruvate to lactate: 967 — ABNORMAL HIGH

## 2019-02-15 LAB — URIC ACID
Urate:MCnc:Pt:Ser/Plas:Qn:: 3.8 — ABNORMAL LOW
Urate:MCnc:Pt:Ser/Plas:Qn:: 3.9 — ABNORMAL LOW

## 2019-02-15 LAB — MAGNESIUM: Magnesium:MCnc:Pt:Ser/Plas:Qn:: 2.3 — ABNORMAL HIGH

## 2019-02-15 LAB — LYMPHOCYTES - REL (DIFF): Lymphocytes/100 leukocytes:NFr:Pt:Bld:Qn:Manual count: 22

## 2019-02-15 LAB — PHOSPHORUS
Phosphate:MCnc:Pt:Ser/Plas:Qn:: 2.6 — ABNORMAL LOW
Phosphate:MCnc:Pt:Ser/Plas:Qn:: 3.1

## 2019-02-15 LAB — TOXIC VACUOLATION

## 2019-02-15 LAB — SLIDE REVIEW

## 2019-02-15 LAB — MONOCYTES ABSOLUTE COUNT: Monocytes:NCnc:Pt:Bld:Qn:Automated count: 0.1 — ABNORMAL LOW

## 2019-02-15 NOTE — Unmapped (Signed)
Pt is A&Ox4, VSS, O2 sats >90% on 3L [place]. x1 unit plts infused this AM. No c/o pain. UO adequate, x1 dark, tarry BM this shift, per pt statement, MD notified. One-time dose Protonix administered per order. Pt's appetite has been diminished. Skin intact, independent turns and standard precautions maintained. No falls/injuries this shift. All monitors with appropriate alarm settings, call bell within reach, will continue to monitor patient.       Problem: Adult Inpatient Plan of Care  Goal: Plan of Care Review  Outcome: Not Progressing  Goal: Patient-Specific Goal (Individualization)  Outcome: Not Progressing  Goal: Absence of Hospital-Acquired Illness or Injury  Outcome: Not Progressing  Goal: Optimal Comfort and Wellbeing  Outcome: Not Progressing  Goal: Readiness for Transition of Care  Outcome: Not Progressing  Goal: Rounds/Family Conference  Outcome: Not Progressing     Problem: Infection  Goal: Infection Symptom Resolution  Outcome: Not Progressing     Problem: COPD Comorbidity  Goal: Maintenance of COPD Symptom Control  Outcome: Not Progressing     Problem: Hypertension Comorbidity  Goal: Blood Pressure in Desired Range  Outcome: Not Progressing     Problem: Wound  Goal: Optimal Wound Healing  Outcome: Not Progressing     Problem: Self-Care Deficit  Goal: Improved Ability to Complete Activities of Daily Living  Outcome: Not Progressing     Problem: Fall Injury Risk  Goal: Absence of Fall and Fall-Related Injury  Outcome: Not Progressing     Problem: Skin Injury Risk Increased  Goal: Skin Health and Integrity  Outcome: Not Progressing

## 2019-02-15 NOTE — Unmapped (Signed)
Pt. is A&O x4, VSS, weaned from 50% bipap to [facility] with O2 sats >90% on RA. Afebrile. 1x c/o pain in knees, tramadol given. Pt went to abdominal ct this afternoon, tolerated well. UO adequate, No BM this shift. Pts. Appetite has been diminished. Skin intact. Q2H turns and protective precautions maintained. 1 PRBC given this shift for low hgb.NO falls/injuries this shift. All monitors with appopriate alarm settings, will continue to monitor.     Problem: Adult Inpatient Plan of Care  Goal: Plan of Care Review  Outcome: Progressing  Goal: Patient-Specific Goal (Individualization)  Outcome: Progressing  Goal: Absence of Hospital-Acquired Illness or Injury  Outcome: Progressing  Goal: Optimal Comfort and Wellbeing  Outcome: Progressing  Goal: Readiness for Transition of Care  Outcome: Progressing  Goal: Rounds/Family Conference  Outcome: Progressing     Problem: Infection  Goal: Infection Symptom Resolution  Outcome: Progressing     Problem: COPD Comorbidity  Goal: Maintenance of COPD Symptom Control  Outcome: Progressing     Problem: Hypertension Comorbidity  Goal: Blood Pressure in Desired Range  Outcome: Progressing     Problem: Wound  Goal: Optimal Wound Healing  Outcome: Progressing     Problem: Self-Care Deficit  Goal: Improved Ability to Complete Activities of Daily Living  Outcome: Progressing     Problem: Fall Injury Risk  Goal: Absence of Fall and Fall-Related Injury  Outcome: Progressing     Problem: Skin Injury Risk Increased  Goal: Skin Health and Integrity  Outcome: Progressing

## 2019-02-15 NOTE — Unmapped (Signed)
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 male with newly diagnosed AML and PMHx of NSTEMI/CAD s/t stents, DMT2, HLD, HTN, COPD, depression/anxiety, mild cognitive impairment , who is admitted for initiation of tx with Azacitidine and Venetoclax.    Plan Summary: C1D5=02/14/19 Aza/Ven. Patient reported melena overnight, transfused unit of platelets. Due to benefit vs risk, will increase Protonix to BID and hold on GI consult and potential procedure. Continue 40 mg Prednisone daily x 5 days, Duo-neb Q4H scheduled for concern for COPD exacerbation. Afebrile, BCx NG24hr and UCx NGTD. CT chest consistent with volume overload/pulmonary edema. Continue Lasix daily. CT abd/pelvis negative for acute infection. Continue Cefepime. Constipated on admission KUB, on Lactulose TID till BM. Hx GIB, denies melena or BRBPR. Plan to transfer back to floor today. Discussed with patient and sister Raymond [name] today about importance of staying in hospital for treatment. Patient agreeable after discussion with sister.    Disposition: Patient has lifelong intellectual disability and will be relocating to live with his sister Raymond [name] (in [place]) upon discharge (her number is [phone]). Touched base with Raymond [name], he has been referred to [facility] but has yet to get established (has appt w/Dr. Glade Nurse on 11/6 at 1pm, will need to be seen prior to being allowed to have labs/support scheduled there). Raymond [name] (sister) to pick up his 02 prior to picking up patient after discharged so its available for drive home.     AML, with MRC, complex karyotype, TP53 mutation: Presented to PCP with abd pain and fatigue, labs revealed pancytopenia. Diagnostic BMBx confirmed AML with TP53 mutation.  Multiple tx regimens were discussed and decided to proceed with Azacitidine and Venetoclax. Plan for BMBx s/p C1.  - C1D1=10/6   - Azacitidine 75 mg/m2 SubQ daily, D1-7 inpatient   - Venetoclax PO daily: 100 mg D1, 200 mg D2, 400 mg D3-7, then 100 mg D8 and forward (d/t posaconazole start)   - Ppx: Valtrex (holding Levaquin while on Cefepime), Plan to start Posaconazole D8  - Allopurinol 300 daily  - IVF boluses per tx plan  - CBC daily, TLS labs Q12H  - Transfuse RBCs if hemoglobin < 7, platelets if < 10K - MANUAL transfusions  [ ]  f/u admission COVID screen (patient has refused numerous times despite education and risks/benefits discussion)  ??  Disposition  - 10/15 Labs at 2:30pm followed by 3:30pm appt w/Dr. Vertell Limber  - 10/19 Labs 12pm followed by appt w/Raymond [name] at 1 pm (D15 appointment)  - Requested labs with transfusion support 2x/weekly starting 10/19 (careful not to duplicate appointments), awaiting scheduling   - Raymond [name] coordinating [facility] in [place] area for medication management     COPD Exacerbation: Reported chronic condition. Uses Symbicort and albuterol inhalers intermittently. Reports recent addition of home O2 due to low O2 level, not needing on admission.   - CXR on admission with generally clear lungs with possible mild edema  - Continue Symbicort (or formulary alternative)  - 10/9 Rapid called overnight for desat's 70s, though exam w/only mild dyspnea, transitioned to  [facility] for BIPAP (worse for about 4 hours), transitioned to 5L [place] this AM, sp02 upper 90's. S/p 20 mg Lasix IVP, CT chest with small bilateral pleural effusions, small pericardial effusion, otherwise negative. Net neg 3L, weight slightly down from admission, BLE swelling improved. Likely COPD exacerbation vs fluid overload. VBG with compensated Ph 7.34, chronic c02 retention in 70s. Started on 40 mg Prednisone daily x 5 days, Duo-neb Q4H scheduled  - Lasix 40 mg Daily   - daily weights, strict I/O   - IS ordered   [ ]  Admission COVID screen (patient continues to refuse)  - Sister Raymond [name]) to pick up patients 02 in [place] prior to picking him up on day of discharge, she is aware tentative d/c date is 10/12    Non-neutropenic Fever: Developed fever overnight on 02/13/19 38.2 that resolved spontaneously. Fevered again to 38.9. Cefepime started, work up ordered. Patient denies cough, SOB, sore throat, dysuria, abdominal pain or diarrhea.   - Cefepime 2 G Q8H (10/9-)  [ ]  10/9 Blood Cultures, NG24hr  [ ]  UCx NGTD  - 10/9 CT Chest: No acute airspace disease. Trace pericardial and trace bilateral pleural effusions. Multiple prominent lymph nodes throughout the supraclavicular region and mediastinum measuring 1.3 cm, which may be compatible with newly diagnosed AML.  - Patient has multiple healing red scabbed areas to face, appears better on exam today, holding off on Vanc at this time, low threshold to start if becomes unstable  - 10/9 CT Abdomen/Pelvis with constrast: pending     Possible GIB: Months long hx of abd pain and dark stools, likely confounded by AML diagnosis and pancytopenia, requiring intermittent blood product transfusions. Pt has also noted BRBPR recently, and remote hx of hemorrhoids. Home med list includes protonix, ranitidine, bentyl, carafate. Follows with GI locally?  - KUB on admission with nonobstructive bowel pattern and moderate stool burden  - Monitor for melana/BRBPR  - Per attending hold off on FOBT  - Transfuse blood/platelets as needed  - Continue Protonix and Carafate  - Low threshold to consult GI should melana/BRBPR occur  - Bowel regimen PRN  - On scheduled Miralax, Senna, Colace  - Lactulose 20 g TID until BM  - [ ]  10/9 CT Abdomen/Pelvis w/constrast: pending     Hx of Psorasis: Patient has BUE psoriasis changes noted as well as bilteral facial scab areas that he notes are itchy at times, suspect patient is picking at his face.   - Cetirizine daily added  - Eucerin cream added  - No drainage, erythema or other c/f infection noted  - CTM    CAD, HTN, HLD, s/p STEMI (2010), with stent placement (2010, 2017): Chronic conditions, follows with [facility]?. Reportedly, most recent cardiac cath in 2019 revealed normal LV function, and known stents. Pt uncertain of current medication regimen, but home med list includes: CCB, ACE, diuretics, BB, vasodilators and nitrates, statin. States he has been out of many meds recently and is unsure what he has been taking. States Plavix and Aspirin have been stopped since he was directed to stop these by Dr. Vertell Limber at recent outpatient visit. EKG on admission was NSR. He has not needed NTG tabs lately.   - Continue home Amlodipine  - Continue home Metoprolol  - Can titrate above meds as needed  - HOLD home ACE, diuretics, and statin d/t TLS risk and DDIs with chemo  - HOLD Plavix/ASA until thrombocytopenia resolution  - NTG PRN  - 10/8 Imdur 30 mg added, patient  c/o episode of chest pain ON 10/7 (not called to night coverage) that resolved on its own, EKG NSR, no change from prior. Of note we are holding dual antiplatelet therapy and his antianginals.   ??  DMT2: Chronic condition. Home med list included Metformin.  - Accuchecks 4x/daily  - SSI d/c on 10/9 given patient has not required any coverage since admission  ??  Pain: Describes as arthritic pain in knees and hands, intermittent frontal headaches. States he needs surgery on his right knee.  - Tramadol 25-50 mg for mod-severe pain per pain scale  - Voltaren gel to knees scheduled 4x/daily  - Tylenol PRN  ??  Anxiety depression: chronic condition. Home med list included Celexa, Cymbalta  - Continue home Celexa  - QTcF on admission 403  ??  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 Recently living at home alone, however planning to move in with sister after hospital d/c, for support while undergoing tx. WIll need local f/u arranged at [facility] in [facility]   - Pharmacy to review home meds to consolidate prior to d/c  - Psych consulted given patients frequent refusal of care, patient refusing HCPOA designation paperwork      DVT Ppx: none d/t thrombocytopenia, encourage ambulation  FEN:  - IV fluids per chemo orders  - Replete electrolytes as needed, monitoring for TLS  - Regular diet  ??  Need for PT: No  Anticipated Discharge Date: 10/12  ??  Code Status: Full code  ??  Subjective:   Afebrile, NAEON. Patient continues to want to go home. He became tearful and states I am depressed. He talked with his sister and is agreeable to staying until Monday. He denies chest pain, SOB, n/v.    10 point ROS otherwise negative except as above in the HPI.     Objective:     Vital signs in last 24 hours:  Temp:  [36.3 ??C (97.3 ??F)-37.6 ??C (99.7 ??F)] 36.6 ??C (97.9 ??F)  Heart Rate:  [74-98] 75  SpO2 Pulse:  [74-96] 74  Resp:  [18-35] 22  BP: (75-158)/(27-60) 131/46  MAP (mmHg):  [42-92] 71  SpO2:  [95 %-100 %] 97 %    Intake/Output last 3 shifts:  I/O last 3 completed shifts:  In: 1466.8 [P.O.:720; Blood:646.8; IV Piggyback:100]  Out: 2050 [Urine:2050]    Meds:  Current Facility-Administered Medications   Medication Dose Route Frequency Provider Last Rate Last Dose   ??? acetaminophen (TYLENOL) tablet 650 mg  650 mg Oral Q6H PRN Janyth Contes, FNP       ??? albuterol 2.5 mg /3 mL (0.083 %) nebulizer solution 2.5 mg  2.5 mg Nebulization Q6H PRN Janyth Contes, FNP   2.5 mg at 02/13/19 2240   ??? allopurinoL (ZYLOPRIM) tablet 300 mg  300 mg Oral Daily Janyth Contes, FNP   300 mg at 02/15/19 8119   ??? amLODIPine (NORVASC) tablet 5 mg  5 mg Oral Daily Janyth Contes, [place]   5 mg at 02/15/19 1478   ??? azaCITIDine (VIDAZA) syringe 164.25 mg  75 mg/m2 (Order-Specific) Subcutaneous Q22H Janyth Contes, FNP   164.25 mg at 02/15/19 1510   ??? cefepime (MAXIPIME) 2 g in dextrose 100 mL IVPB (premix)  2 g Intravenous Q8H Gabriela Sarita Clover Mealy, NP 200 mL/hr at 02/15/19 0906 2 g at 02/15/19 0906   ??? cetirizine (ZyrTEC) tablet 10 mg  10 mg Oral Daily Janyth Contes, [place]   10  mg at 02/15/19 0904   ??? CHEMO CLARIFICATION ORDER   Other Continuous PRN Janyth Contes, FNP       ??? citalopram (CeleXA) tablet 20 mg  20 mg Oral Daily Janyth Contes, [place]   20 mg at 02/15/19 1610   ??? diclofenac sodium (VOLTAREN) 1 % gel 2 g  2 g Topical 4x Daily Janyth Contes, FNP   2 g at 02/15/19 1152   ??? diphenhydrAMINE (BENADRYL) injection 25 mg  25 mg Intravenous Q4H PRN Janyth Contes, FNP       ??? docusate sodium (COLACE) capsule 100 mg  100 mg Oral Daily Janyth Contes, FNP   100 mg at 02/15/19 9604   ??? EPINEPHrine (EPIPEN) injection 0.3 mg  0.3 mg Intramuscular Daily PRN Janyth Contes, FNP       ??? famotidine (PF) (PEPCID) injection 20 mg  20 mg Intravenous Q4H PRN Janyth Contes, FNP       ??? fluticasone furoate-vilanteroL (BREO ELLIPTA) 200-25 mcg/dose inhaler 1 puff  1 puff Inhalation Daily (RT) Janyth Contes, FNP   1 puff at 02/15/19 0754   ??? furosemide (LASIX) tablet 40 mg  40 mg Oral Daily Janyth Contes, FNP   40 mg at 02/15/19 5409   ??? IP OKAY TO TREAT   Other Continuous PRN Janyth Contes, FNP       ??? ipratropium-albuteroL (DUO-NEB) 0.5-2.5 mg/3 mL nebulizer solution 3 mL  3 mL Nebulization Q4H (RT) Gabriela Launa Grill, NP   3 mL at 02/15/19 0755   ??? isosorbide mononitrate (IMDUR) 24 hr tablet 30 mg  30 mg Oral Daily Janyth Contes, FNP   30 mg at 02/15/19 8119   ??? lactulose (CHRONULAC) oral solution (30 mL cup)  20 g Oral TID Janyth Contes, FNP   20 g at 02/15/19 1351   ??? loperamide (IMODIUM) capsule 2 mg  2 mg Oral Q2H PRN Janyth Contes, FNP       ??? loperamide (IMODIUM) capsule 4 mg  4 mg Oral Once PRN Janyth Contes, FNP       ??? meperidine (DEMEROL) injection 25 mg  25 mg Intravenous Q30 Min PRN Janyth Contes, [place]       ??? methylPREDNISolone sodium succinate (PF) (Solu-MEDROL) injection 125 mg  125 mg Intravenous Q4H PRN Janyth Contes, FNP       ??? metoprolol succinate (TOPROL-XL) 24 hr tablet 25 mg  25 mg Oral Daily Janyth Contes, FNP   25 mg at 02/14/19 1819   ??? nitroglycerin (NITROSTAT) SL tablet 0.4 mg  0.4 mg Sublingual Q5 Min PRN Janyth Contes, FNP       ??? ondansetron [facility]) injection 8 mg  8 mg Intravenous Once PRN Janyth Contes, FNP       ??? ondansetron [facility]) tablet 16 mg  16 mg Oral Q22H Janyth Contes, FNP   16 mg at 02/15/19 1351   ??? pantoprazole (PROTONIX) EC tablet 40 mg  40 mg Oral BID Beryle Beams, PA       ??? polyethylene glycol (MIRALAX) packet 17 g  17 g Oral Daily Janyth Contes, [place]   17 g at 02/15/19 1478   ??? predniSONE (DELTASONE) tablet 40 mg  40 mg Oral Daily Gabriela Sarita Clover Mealy, NP   40 mg at 02/15/19 0900   ??? prochlorperazine (COMPAZINE) tablet 5 mg  5 mg Oral Q6H PRN [place]  Laurine Blazer, FNP   5 mg at 02/14/19 0026   ??? senna (SENOKOT) tablet 2 tablet  2 tablet Oral Nightly Janyth Contes, [place]   2 tablet at 02/14/19 2016   ??? sodium chloride (NS) 0.9 % infusion  20 mL/hr Intravenous Continuous PRN Janyth Contes, FNP       ??? sodium chloride 0.9% (NS) bolus 1,000 mL  1,000 mL Intravenous Daily PRN Janyth Contes, FNP       ??? sucralfate (CARAFATE) oral suspension  1 g Oral ACHS Janyth Contes, [place]   1 g at 02/15/19 1152   ??? traMADoL (ULTRAM) tablet 25 mg  25 mg Oral Q6H PRN Janyth Contes, FNP   25 mg at 02/15/19 1249    Or   ??? traMADoL (ULTRAM) tablet 50 mg  50 mg Oral Q6H PRN Janyth Contes, FNP   50 mg at 02/14/19 1819   ??? valACYclovir (VALTREX) tablet 500 mg  500 mg Oral Daily Janyth Contes, FNP   500 mg at 02/15/19 1610   ??? venetoclax (VENCLEXTA) tablet 400 mg  400 mg Oral Daily Janyth Contes, FNP 400 mg at 02/14/19 1708   ??? white petrolatum-mineral oil-lanolin topical cream   Topical TID PRN Janyth Contes, FNP           Physical Exam:  General: Resting, in no apparent distress, lying in bed  HEENT:  PERRL. No scleral icterus or conjunctival injection. MMM without ulceration, erythema or exudate. No cervical or axillary lymphadenopathy.   Heart:  RRR. S1, S2. No murmurs, gallops, or rubs.  Lungs:  Breathing is unlabored, and patient is speaking full sentences with ease. Very diminished air movement throughout on exam, audible wheeze present  Abdomen:  No distention or pain on palpation.  Bowel sounds are present and normoactive x 4.  No palpable hepatomegaly or splenomegaly.  No palpable masses.   Skin: Warm to touch, dry. Facial scabbed areas bilaterally, no drainage, itchy at times per patient, psoriasis to BUE  Musculoskeletal:  No grossly-evident joint effusions or deformities.  Range of motion about the shoulder, elbow, hips and knees is grossly normal.  Psychiatric:  Range of affect is appropriate.    Neurologic:  Alert and oriented to person, place, time and situation.  Gait is normal.  Extremities:  Appear well-perfused. No clubbing, edema, or cyanosis.  CVAD: PIV, C/D/I      Labs:  Recent Labs     02/14/19  0130  02/14/19  0537 02/14/19  1226 02/14/19  1516 02/15/19  0128 02/15/19  0711 02/15/19  0712   WBC  --    < > 1.3* 1.7* 1.3* 1.4*  --  1.0*   NEUTROABS  --   --  0.6* 0.7*  --  0.9*  --  0.7*   LYMPHSABS  --   --  0.5* 0.6*  --  0.3*  --   --    HGB  --    < > 6.2* 6.5* 6.4* 7.0*  --  7.1*   HCT  --    < > 18.7* 19.6* 19.4* 20.8*  --  20.8*   PLT  --    < > 19* 16* 17* 13*  --  46*   CREATININE 0.69*  --  0.67*  --  0.69*  --  0.65*  --    BUN 17  --  18  --  22*  --  30*  --    BILITOT  --   --  1.4*  --  1.3*  --  1.1  --    AST  --   --  39  --  32  --  28  --    ALT  --   --  26  --  22  --  22  --    ALKPHOS  --   --  62  --  58  --  58  --    K 3.8  --  4.1  --  4.1  --  4.7  -- MG 1.8  --  1.7  --   --   --  2.3*  --    CALCIUM 8.2*  --  8.2*  --  8.0*  --  8.4*  --    NA 135  --  134*  --  135  --  136  --    CL 92*  --  94*  --  92*  --  92*  --    CO2 40.0*  --  37.0*  --  37.0*  --  38.0*  --    PHOS  --   --  3.1  --  2.7*  --  3.1  --    INR  --   --  1.26  --   --   --   --   --     < > = values in this interval not displayed.       Imaging:  CXR reviewed per EMR  CT chest reviewed per EMR  CT A/P pending, will review    Lowella Fairy 02/15/2019  3:20 PM   Physician Assistant  Hematology/Oncology Department   [facility]   Group pager: [phone]

## 2019-02-15 NOTE — Unmapped (Signed)
Patient was compliant with his inhaled respiratory treatments. Will continue to monitor.

## 2019-02-15 NOTE — Unmapped (Addendum)
Pt is Raymond [name] and Ox4. BP 130s/40s-50s. HR 70s. Spo2>95% on 2L [place]. Reported right knee pain of 6/10, managed w/ PRN tramadol and PRN tylenol. Given SQ chemo med by [facility] nurse. Had 1 loose, black BM and 2X UOP.  PO intake improved. Skin intact. Bed alarm on and fall precautions maintained. Will continue to monitor.     Problem: Adult Inpatient Plan of Care  Goal: Plan of Care Review  Outcome: Ongoing - Unchanged  Goal: Patient-Specific Goal (Individualization)  Outcome: Ongoing - Unchanged  Goal: Absence of Hospital-Acquired Illness or Injury  Outcome: Ongoing - Unchanged  Goal: Optimal Comfort and Wellbeing  Outcome: Ongoing - Unchanged  Goal: Readiness for Transition of Care  Outcome: Ongoing - Unchanged  Goal: Rounds/Family Conference  Outcome: Ongoing - Unchanged     Problem: Infection  Goal: Infection Symptom Resolution  Outcome: Ongoing - Unchanged     Problem: COPD Comorbidity  Goal: Maintenance of COPD Symptom Control  Outcome: Ongoing - Unchanged     Problem: Hypertension Comorbidity  Goal: Blood Pressure in Desired Range  Outcome: Ongoing - Unchanged     Problem: Wound  Goal: Optimal Wound Healing  Outcome: Ongoing - Unchanged     Problem: Self-Care Deficit  Goal: Improved Ability to Complete Activities of Daily Living  Outcome: Ongoing - Unchanged     Problem: Fall Injury Risk  Goal: Absence of Fall and Fall-Related Injury  Outcome: Ongoing - Unchanged     Problem: Skin Injury Risk Increased  Goal: Skin Health and Integrity  Outcome: Ongoing - Unchanged

## 2019-02-16 LAB — MANUAL DIFFERENTIAL
BASOPHILS - REL (DIFF): 1 %
BLASTS - REL (DIFF): 5 % (ref <=0)
EOSINOPHILS - ABS (DIFF): 0 10*9/L (ref 0.0–0.4)
EOSINOPHILS - REL (DIFF): 1 %
LYMPHOCYTES - ABS (DIFF): 0.3 10*9/L — ABNORMAL LOW (ref 1.5–5.0)
LYMPHOCYTES - REL (DIFF): 30 %
MONOCYTES - ABS (DIFF): 0.1 10*9/L — ABNORMAL LOW (ref 0.2–0.8)
MONOCYTES - REL (DIFF): 6 %
NEUTROPHILS - ABS (DIFF): 0.5 10*9/L — ABNORMAL LOW (ref 2.0–7.5)
NEUTROPHILS - REL (DIFF): 57 %

## 2019-02-16 LAB — PHOSPHORUS: Phosphate:MCnc:Pt:Ser/Plas:Qn:: 2.7 — ABNORMAL LOW

## 2019-02-16 LAB — CBC W/ AUTO DIFF
HEMATOCRIT: 20.2 % — ABNORMAL LOW (ref 41.0–53.0)
HEMOGLOBIN: 6.6 g/dL — ABNORMAL LOW (ref 13.5–17.5)
MEAN CORPUSCULAR HEMOGLOBIN CONC: 32.6 g/dL (ref 31.0–37.0)
MEAN CORPUSCULAR HEMOGLOBIN: 31.6 pg (ref 26.0–34.0)
MEAN CORPUSCULAR VOLUME: 97.1 fL (ref 80.0–100.0)
MEAN PLATELET VOLUME: 10.9 fL — ABNORMAL HIGH (ref 7.0–10.0)
NUCLEATED RED BLOOD CELLS: 7 /100{WBCs} — ABNORMAL HIGH (ref <=4)
PLATELET COUNT: 40 10*9/L — ABNORMAL LOW (ref 150–440)
RED BLOOD CELL COUNT: 2.08 10*12/L — ABNORMAL LOW (ref 4.50–5.90)
RED CELL DISTRIBUTION WIDTH: 18.4 % — ABNORMAL HIGH (ref 12.0–15.0)
WBC ADJUSTED: 0.9 10*9/L — ABNORMAL LOW (ref 4.5–11.0)

## 2019-02-16 LAB — URIC ACID: Urate:MCnc:Pt:Ser/Plas:Qn:: 3.8 — ABNORMAL LOW

## 2019-02-16 LAB — COMPREHENSIVE METABOLIC PANEL
ALBUMIN: 2.8 g/dL — ABNORMAL LOW (ref 3.5–5.0)
ALKALINE PHOSPHATASE: 53 U/L (ref 38–126)
ALT (SGPT): 22 U/L (ref <50)
ANION GAP: 6 mmol/L — ABNORMAL LOW (ref 7–15)
AST (SGOT): 27 U/L (ref 19–55)
BILIRUBIN TOTAL: 0.7 mg/dL (ref 0.0–1.2)
BUN / CREAT RATIO: 38
CALCIUM: 8.4 mg/dL — ABNORMAL LOW (ref 8.5–10.2)
CHLORIDE: 92 mmol/L — ABNORMAL LOW (ref 98–107)
CO2: 40 mmol/L (ref 22.0–30.0)
CREATININE: 0.9 mg/dL (ref 0.70–1.30)
EGFR CKD-EPI AA MALE: >90 mL/min/{1.73_m2} (ref >=60)
EGFR CKD-EPI NON-AA MALE: >90 mL/min/{1.73_m2} (ref >=60)
GLUCOSE RANDOM: 123 mg/dL (ref 70–179)
POTASSIUM: 4 mmol/L (ref 3.5–5.0)
PROTEIN TOTAL: 5.5 g/dL — ABNORMAL LOW (ref 6.5–8.3)

## 2019-02-16 LAB — EOSINOPHILS - REL (DIFF): Eosinophils/100 leukocytes:NFr:Pt:Bld:Qn:Manual count: 1

## 2019-02-16 LAB — WBC ADJUSTED: Leukocytes:NCnc:Pt:Bld:Qn:: 0.9 — ABNORMAL LOW

## 2019-02-16 LAB — LACTATE DEHYDROGENASE: Lactate dehydrogenase:CCnc:Pt:Ser/Plas:Qn:Reaction: pyruvate to lactate: 920 — ABNORMAL HIGH

## 2019-02-16 LAB — MAGNESIUM: Magnesium:MCnc:Pt:Ser/Plas:Qn:: 2.1

## 2019-02-16 LAB — EGFR CKD-EPI NON-AA MALE: Lab: >90

## 2019-02-16 NOTE — Unmapped (Signed)
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 male with newly diagnosed AML and PMHx of NSTEMI/CAD s/t stents, DMT2, HLD, HTN, COPD, depression/anxiety, mild cognitive impairment , who is admitted for initiation of tx with Azacitidine and Venetoclax.    Plan Summary: C1D6=02/14/19 Aza/Ven. Afebrile for >48hr and BCx NG48hr. Stopped Cefepime and transitioned back to Levaquin ppx. Continue Protonix BID. Continue 40 mg Prednisone daily x 5 days, Duo-neb Q4H scheduled for concern for COPD exacerbation. Continue Lasix daily. Continue Cefepime. Transfuse 1 unit of blood today for Hgb <7, monitor for signs of volume overload.     Disposition: Patient has lifelong intellectual disability and will be relocating to live with his sister [name] (in [place]) upon discharge (her number is [phone]). Touched base with [name], he has been referred to [facility] but has yet to get established (has appt w/Dr. Glade Nurse on 11/6 at 1pm, will need to be seen prior to being allowed to have labs/support scheduled there). [name] (sister) to pick up his 02 prior to picking up patient after discharged so its available for drive home.     AML, with MRC, complex karyotype, TP53 mutation: Presented to PCP with abd pain and fatigue, labs revealed pancytopenia. Diagnostic BMBx confirmed AML with TP53 mutation.  Multiple tx regimens were discussed and decided to proceed with Azacitidine and Venetoclax. Plan for BMBx s/p C1.  - C1D1=10/6   - Azacitidine 75 mg/m2 SubQ daily, D1-7 inpatient   - Venetoclax PO daily: 100 mg D1, 200 mg D2, 400 mg D3-7, then 100 mg D8 and forward (d/t posaconazole start) - Ppx: Valtrex and Levaquin ppx, Plan to start Posaconazole D8  - Allopurinol 300 daily  - IVF boluses per tx plan  - CBC daily, TLS labs daily  - Transfuse RBCs if hemoglobin < 7, platelets if < 10K - MANUAL transfusions  [ ]  f/u admission COVID screen (patient has refused numerous times despite education and risks/benefits discussion)  ??  Disposition  - 10/15 Labs at 2:30pm followed by 3:30pm appt w/Dr. Vertell Limber  - 10/19 Labs 12pm followed by appt w/[name] at 1 pm (D15 appointment)  - Requested labs with transfusion support 2x/weekly starting 10/19 (careful not to duplicate appointments), awaiting scheduling   - [name] coordinating [facility] in [place] area for medication management     COPD Exacerbation: Reported chronic condition. Uses Symbicort and albuterol inhalers intermittently. Reports recent addition of home O2 due to low O2 level, not needing on admission. CXR on admission with generally clear lungs with possible mild edema. 10/9 Rapid called overnight for desat's 70s, though exam w/only mild dyspnea, transitioned to [facility] for BIPAP (worse for about 4 hours), transitioned to 5L [place] this AM, sp02 upper 90's. S/p 20 mg Lasix IVP, CT chest with small bilateral pleural effusions, small pericardial effusion, otherwise negative. Net neg 3L, weight slightly down from admission, BLE swelling improved. Likely COPD exacerbation vs fluid overload. VBG with compensated Ph 7.34, chronic c02 retention in 70s.  - Continue Symbicort (or formulary alternative)  -  Started on 40 mg Prednisone daily x 5 days, Duo-neb Q4H scheduled  - Lasix 40 mg Daily   - daily weights, strict I/O   - IS ordered   [ ]  Admission COVID screen (patient continues to refuse)  - Sister [name]) to pick up patients 02 in [place] prior to picking him up on day of discharge, she is aware tentative d/c date is 10/12 Non-neutropenic Fever: Developed fever overnight on 02/13/19 38.2 that resolved spontaneously. Fevered again to 38.9. Cefepime started, work up ordered. Patient denies cough, SOB, sore throat, dysuria, abdominal pain or diarrhea. Patient had multiple healing red scabbed areas to face, appears better on exam, Vancomycin was not started. 10/9 CT Chest: No acute airspace disease. Trace pericardial and trace bilateral pleural effusions. Multiple prominent lymph nodes throughout the supraclavicular region and mediastinum measuring 1.3 cm, which may be compatible with newly diagnosed AML. 10/9 CT Abdomen/Pelvis with contrast: negative for infection  - S/p Cefepime 2 G Q8H (10/9-10/11)  [ ]  10/9 Blood Cultures, NG24hr  [ ]  UCx NGTD    Possible GIB: Months long hx of abd pain and dark stools, likely confounded by AML diagnosis and pancytopenia, requiring intermittent blood product transfusions. Pt has also noted BRBPR recently, and remote hx of hemorrhoids. Home med list includes protonix, ranitidine, bentyl, carafate. Follows with GI locally?  - KUB on admission with nonobstructive bowel pattern and moderate stool burden  - Monitor for melana/BRBPR  - Per attending hold off on FOBT  - Transfuse blood/platelets as needed  - Continue Protonix and Carafate  - Low threshold to consult GI should melana/BRBPR occur  - Bowel regimen PRN  - On scheduled Miralax, Senna, Colace  - Lactulose 20 g TID until BM; discontinued 10/11 due to diarrhea  - 10/9 CT Abdomen/Pelvis w/constrast: negative for acute process     Hx of Psorasis: Patient has BUE psoriasis changes noted as well as bilteral facial scab areas that he notes are itchy at times, suspect patient is picking at his face.   - Cetirizine daily added  - Eucerin cream added  - No drainage, erythema or other c/f infection noted  - CTM CAD, HTN, HLD, s/p STEMI (2010), with stent placement (2010, 2017): Chronic conditions, follows with [facility]?. Reportedly, most recent cardiac cath in 2019 revealed normal LV function, and known stents. Pt uncertain of current medication regimen, but home med list includes: CCB, ACE, diuretics, BB, vasodilators and nitrates, statin. States he has been out of many meds recently and is unsure what he has been taking. States Plavix and Aspirin have been stopped since he was directed to stop these by Dr. Vertell Limber at recent outpatient visit. EKG on admission was NSR. He has not needed NTG tabs lately.   - Continue home Amlodipine  - Continue home Metoprolol  - Can titrate above meds as needed  - HOLD home ACE, diuretics, and statin d/t TLS risk and DDIs with chemo  - HOLD Plavix/ASA until thrombocytopenia resolution  - NTG PRN  - 10/8 Imdur 30 mg added, patient c/o episode of chest pain ON 10/7 (not called to night coverage) that resolved on its own, EKG NSR, no change from prior. Of note we are holding dual antiplatelet therapy and his antianginals.   ??  DMT2: Chronic condition. Home med list included Metformin.  - Accuchecks 4x/daily  - SSI d/c on 10/9 given patient has not required any coverage since admission  ??  Pain: Describes as arthritic pain in knees and hands,  intermittent frontal headaches. States he needs surgery on his right knee.  - Tramadol 25-50 mg for mod-severe pain per pain scale  - Voltaren gel to knees scheduled 4x/daily  - Tylenol PRN  ??  Anxiety depression: chronic condition. Home med list included Celexa, Cymbalta  - Continue home Celexa  - QTcF on admission 403  ??  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 Recently living at home alone, however planning to move in with sister after hospital d/c, for support while undergoing tx. WIll need local f/u arranged at [facility] in [facility].   - Pharmacy to review home meds to consolidate prior to d/c - Psych consulted given patients frequent refusal of care, patient refusing HCPOA designation paperwork      DVT Ppx: none d/t thrombocytopenia, encourage ambulation  FEN:  - IV fluids per chemo orders  - Replete electrolytes as needed, monitoring for TLS  - Regular diet  ??  Need for PT: No  Anticipated Discharge Date: 10/12  ??  Code Status: Full code  ??  Subjective:   Afebrile, NAEON. Patient is excited about going home tomorrow. He has no new concerns. He reports his breathing is good.    10 point ROS otherwise negative except as above in the HPI.     Objective:     Vital signs in last 24 hours:  Temp:  [36.1 ??C (97 ??F)-36.8 ??C (98.2 ??F)] 36.4 ??C (97.5 ??F)  Heart Rate:  [73-90] 76  SpO2 Pulse:  [81] 81  Resp:  [18-20] 18  BP: (113-135)/(55-73) 133/60  MAP (mmHg):  [81-87] 87  SpO2:  [88 %-96 %] 94 %    Intake/Output last 3 shifts:  I/O last 3 completed shifts:  In: 1768.3 [P.O.:1210; I.V.:20; Blood:212; IV Piggyback:326.3]  Out: 611 [Urine:611]    Meds:  Current Facility-Administered Medications   Medication Dose Route Frequency Provider Last Rate Last Dose   ??? acetaminophen (TYLENOL) tablet 650 mg  650 mg Oral Q6H PRN Beryle Beams, PA       ??? albuterol 2.5 mg /3 mL (0.083 %) nebulizer solution 2.5 mg  2.5 mg Nebulization Q6H PRN Beryle Beams, PA   2.5 mg at 02/13/19 2240   ??? allopurinoL (ZYLOPRIM) tablet 300 mg  300 mg Oral Daily Beryle Beams, PA   300 mg at 02/16/19 7829   ??? amLODIPine (NORVASC) tablet 5 mg  5 mg Oral Daily Beryle Beams, PA   5 mg at 02/16/19 5621   ??? azaCITIDine (VIDAZA) syringe 164.25 mg  75 mg/m2 (Order-Specific) Subcutaneous Q22H Beryle Beams, PA   164.25 mg at 02/16/19 1342   ??? cetirizine (ZyrTEC) tablet 10 mg  10 mg Oral Daily Beryle Beams, [place]   10 mg at 02/16/19 0858   ??? CHEMO CLARIFICATION ORDER   Other Continuous PRN Beryle Beams, PA       ??? citalopram (CeleXA) tablet 20 mg  20 mg Oral Daily Beryle Beams, PA   20 mg at 02/16/19 3086 ??? diclofenac sodium (VOLTAREN) 1 % gel 2 g  2 g Topical 4x Daily Beryle Beams, PA   2 g at 02/16/19 1139   ??? diphenhydrAMINE (BENADRYL) injection 25 mg  25 mg Intravenous Q4H PRN Beryle Beams, PA       ??? docusate sodium (COLACE) capsule 100 mg  100 mg Oral Daily Beryle Beams, PA   100 mg at 02/15/19 0903   ??? EPINEPHrine (EPIPEN)  injection 0.3 mg  0.3 mg Intramuscular Daily PRN Beryle Beams, PA       ??? famotidine (PF) (PEPCID) injection 20 mg  20 mg Intravenous Q4H PRN Beryle Beams, PA       ??? fluticasone furoate-vilanteroL (BREO ELLIPTA) 200-25 mcg/dose inhaler 1 puff  1 puff Inhalation Daily (RT) Beryle Beams, PA   1 puff at 02/16/19 0747   ??? furosemide (LASIX) tablet 40 mg  40 mg Oral Daily Beryle Beams, PA   40 mg at 02/16/19 0858   ??? IP OKAY TO TREAT   Other Continuous PRN Beryle Beams, PA       ??? ipratropium-albuteroL (DUO-NEB) 0.5-2.5 mg/3 mL nebulizer solution 3 mL  3 mL Nebulization Q4H (RT) Beryle Beams, PA   3 mL at 02/16/19 1139   ??? isosorbide mononitrate (IMDUR) 24 hr tablet 30 mg  30 mg Oral Daily Beryle Beams, PA   30 mg at 02/16/19 4540   ??? lactulose (CHRONULAC) oral solution (30 mL cup)  20 g Oral TID Beryle Beams, PA   20 g at 02/15/19 2134   ??? [START ON 02/17/2019] levoFLOXacin (LEVAQUIN) tablet 500 mg  500 mg Oral Q24H SCH Beryle Beams, PA       ??? loperamide (IMODIUM) capsule 2 mg  2 mg Oral Q2H PRN Beryle Beams, PA       ??? loperamide (IMODIUM) capsule 4 mg  4 mg Oral Once PRN Beryle Beams, PA       ??? meperidine (DEMEROL) injection 25 mg  25 mg Intravenous Q30 Min PRN Beryle Beams, PA       ??? methylPREDNISolone sodium succinate (PF) (Solu-MEDROL) injection 125 mg  125 mg Intravenous Q4H PRN Beryle Beams, PA       ??? metoprolol succinate (TOPROL-XL) 24 hr tablet 25 mg  25 mg Oral Daily Beryle Beams, PA   25 mg at 02/15/19 1746   ??? nitroglycerin (NITROSTAT) SL tablet 0.4 mg  0.4 mg Sublingual Q5 Min PRN Beryle Beams, PA ??? ondansetron [facility]) injection 8 mg  8 mg Intravenous Once PRN Beryle Beams, PA       ??? ondansetron [facility]) tablet 16 mg  16 mg Oral Q22H Beryle Beams, PA   16 mg at 02/16/19 1139   ??? pantoprazole (PROTONIX) EC tablet 40 mg  40 mg Oral BID Beryle Beams, PA   40 mg at 02/16/19 9811   ??? polyethylene glycol (MIRALAX) packet 17 g  17 g Oral Daily Beryle Beams, PA   17 g at 02/15/19 9147   ??? predniSONE (DELTASONE) tablet 40 mg  40 mg Oral Daily Beryle Beams, PA   40 mg at 02/16/19 0745   ??? prochlorperazine (COMPAZINE) tablet 5 mg  5 mg Oral Q6H PRN Beryle Beams, PA   5 mg at 02/14/19 0026   ??? senna (SENOKOT) tablet 2 tablet  2 tablet Oral Nightly Beryle Beams, [place]   2 tablet at 02/14/19 2016   ??? sodium chloride (NS) 0.9 % infusion  20 mL/hr Intravenous Continuous PRN Beryle Beams, PA       ??? sodium chloride 0.9% (NS) bolus 1,000 mL  1,000 mL Intravenous Daily PRN Beryle Beams, PA       ??? sucralfate (CARAFATE) oral suspension  1 g Oral ACHS Beryle Beams, PA   1 g at 02/16/19 1139   ??? traMADoL (  ULTRAM) tablet 25 mg  25 mg Oral Q6H PRN Beryle Beams, PA   25 mg at 02/15/19 1249    Or   ??? traMADoL (ULTRAM) tablet 50 mg  50 mg Oral Q6H PRN Beryle Beams, PA   50 mg at 02/16/19 0745   ??? valACYclovir (VALTREX) tablet 500 mg  500 mg Oral Daily Beryle Beams, PA   500 mg at 02/16/19 0859   ??? venetoclax (VENCLEXTA) tablet 400 mg  400 mg Oral Daily Beryle Beams, PA   400 mg at 02/15/19 1745   ??? white petrolatum-mineral oil-lanolin topical cream   Topical TID PRN Beryle Beams, PA           Physical Exam:  General: Resting, in no apparent distress, lying in bed  HEENT:  PERRL. No scleral icterus or conjunctival injection. MMM without ulceration, erythema or exudate. No cervical or axillary lymphadenopathy.   Heart:  RRR. S1, S2. No murmurs, gallops, or rubs.  Lungs:  Breathing is unlabored, and patient is speaking full sentences with ease. Very diminished air movement throughout on exam, audible wheeze present Abdomen:  No distention or pain on palpation.  Bowel sounds are present and normoactive x 4.  No palpable hepatomegaly or splenomegaly.  No palpable masses.   Skin: Warm to touch, dry. Facial scabbed areas bilaterally, no drainage, itchy at times per patient, psoriasis to BUE  Musculoskeletal:  No grossly-evident joint effusions or deformities.  Range of motion about the shoulder, elbow, hips and knees is grossly normal.  Psychiatric:  Range of affect is appropriate.    Neurologic:  Alert and oriented to person, place, time and situation.  Gait is normal.  Extremities:  Appear well-perfused. No clubbing, edema, or cyanosis.  CVAD: PIV, C/D/I      Labs:  Recent Labs     02/14/19  0537 02/14/19  1226 02/14/19  1226 02/15/19  0128 02/15/19  0711 02/15/19  0712 02/15/19  1805 02/16/19  0616   WBC 1.3* 1.7*   < > 1.4*  --  1.0*  --  0.9*   NEUTROABS 0.6* 0.7*  --  0.9*  --  0.7*  --  0.5*   LYMPHSABS 0.5* 0.6*  --  0.3*  --   --   --   --    HGB 6.2* 6.5*   < > 7.0*  --  7.1*  --  6.6*   HCT 18.7* 19.6*   < > 20.8*  --  20.8*  --  20.2*   PLT 19* 16*   < > 13*  --  46*  --  40*   CREATININE 0.67*  --    < >  --  0.65*  --  0.77 0.90   BUN 18  --    < >  --  30*  --  33* 34*   BILITOT 1.4*  --    < >  --  1.1  --  0.9 0.7   AST 39  --    < >  --  28  --  26 27   ALT 26  --    < >  --  22  --  21 22   ALKPHOS 62  --    < >  --  58  --  58 53   K 4.1  --    < >  --  4.7  --  4.4 4.0   MG 1.7  --   --   --  2.3*  --   --  2.1   CALCIUM 8.2*  --    < >  --  8.4*  --  8.4* 8.4*   NA 134*  --    < >  --  136  --  135 138   CL 94*  --    < >  --  92*  --  93* 92*   CO2 37.0*  --    < >  --  38.0*  --  35.0* 40.0*   PHOS 3.1  --    < >  --  3.1  --  2.6* 2.7*   INR 1.26  --   --   --   --   --   --   --     < > = values in this interval not displayed.       Imaging:  CXR reviewed per EMR  CT chest reviewed per EMR  CT A/P pending, will review    Lowella Fairy 02/16/2019  1:57 PM   Physician Assistant Hematology/Oncology Department   [facility]   Group pager: [phone]

## 2019-02-16 NOTE — Unmapped (Signed)
Patient remains afebrile, remains on 2L O2 per nasal cannula with saturation levels 93-94%. Continues to have dark brown stools. Patient able to ambulate to bathroom with one person assist. Small tremor noted in left hand after trip to bathroom. Same tremor noted in previous days. Patient alert and gives appropriate responses to questions. Care clustered at night for optimum sleep. Monitor

## 2019-02-16 NOTE — Unmapped (Addendum)
Raymond [name]??is a 63 y.o.??male??with newly diagnosed AML??and PMHx of NSTEMI/CAD s/t stents, DMT2, HLD, HTN, COPD, depression/anxiety, mild cognitive impairment??, who is admitted for??initiation of tx with Azacitidine and Venetoclax. He tolerated treatment well. His course was complicated by non-neutropenic fever and likely COPD exacerbation requiring brief transfer to step-down unit. Fever work up was negative with CT chest and CT abdomen pelvis negative for infection. BCx were NGTD at discharge. He was transitioned from Cefepime to Levaquin and remained afebrile for 24 hours prior to discharge. He was started on 5 day course of Prednisone 40mg  for COPD exacerbation. He was discharged on a Prednisone taper as detailed below. His O2 requirement returned to his baseline 2L. His Protonix was increased to 40mg  BID due to concern for GI bleed and benefit vs risk for endoscopy. Plan to continue Protonix BID for 6 week course. He will discharge on Levaquin, Valtrex, and Posa ppx. He will start Posaconazole on 10/13 and decrease his dose of Venetoclax to 100mg . It was confirmed that patient had both of these medications prior to discharge. He was also discharged with 30 day supply of all home medications that were continued during admission. He also received 2 units of RBCs on day of discharge. He has a follow up scheduled with Dr. Vertell Limber on 10/15 at [facility]. He has labs twice weekly at [facility] starting 10/15. Detailed hospital course as below: AML, with MRC, complex karyotype, TP53 mutation: Presented to PCP with abd pain and fatigue, labs revealed pancytopenia. Diagnostic BMBx confirmed AML with TP53 mutation. ??Multiple tx regimens were discussed and decided to proceed with Azacitidine and Venetoclax. Plan for BMBx s/p C1. Patient refused admission COVID screening numerous times despite education and risk/benefit discussion. C1D1=10/6. Venetoclax PO daily ramp up as follows: 100 mg D1, 200 mg D2, 400 mg D3-7, then 100 mg D8 and forward (d/t posaconazole start). He was started on Allopurinol daily with BID TLS lab monitoring. He had no signs of TLS. He was started on Valtrex ppx. He was transitioned to Levaquin ppx following Cefepime. He will start Posaconazole on 10/13 and decrease his dose of Venetoclax to 100mg . It was confirmed that patient had both of these medications prior to discharge. He will discharge on Levaquin, Valtrex, and Posa ppx. He will start Posaconazole on 10/13 and decrease his dose of Venetoclax to 100mg . It was confirmed that patient had both of these medications prior to discharge. He was also discharged with 30 day supply of all home medications that were continued during admission. He also received 2 units of RBCs on day of discharge. He has a follow up scheduled with Dr. Vertell Limber on 10/15 at [facility]. He has labs twice weekly at [facility] starting 10/15.  ??  Disposition  - 10/15 Labs at 2:30pm followed by 3:30pm appt w/Dr. Vertell Limber  - 10/19 Labs 12pm followed by appt w/[name] at 1 pm (D15 appointment)  - labs with transfusion support 2x/weekly starting 10/15 scheduled  - Katherine coordinating [facility] in [place] area for medication management   - 10/29 BmBx scheduled   - Follow up with Oliver Hum, NP scheduled 11/5 COPD Exacerbation:??Reported chronic condition. Uses Symbicort and albuterol inhalers intermittently. Reports recent addition of home O2 due to low O2 level, not needing on admission. CXR on admission with generally clear lungs with possible mild edema. Continued Symbicort during admission. 10/9 Rapid called overnight for desat's 70s, though exam w/only mild dyspnea, transitioned to [facility] for BIPAP (worse for about 4 hours), transitioned to 5L [place] this  AM, sp02 upper 90's. S/p 20 mg Lasix IVP, CT chest with small bilateral pleural effusions, small pericardial effusion, otherwise negative. Net neg 3L, weight slightly down from admission, BLE swelling improved. Likely COPD exacerbation vs fluid overload. VBG with compensated Ph 7.34, chronic c02 retention in 70s. He was started on 40 mg Prednisone daily x 5 days (10/9- ), Duo-neb Q4H scheduled. He continued Lasix 40mg  daily PO. He was discharged on Prednisone taper 40mg  x1 day (to complete 5 day course), 20mg  daily x5 days then 10mg  daily x5 days. Sister [name]) to pick up patients 02 in [place] prior to picking him up on day of discharge, she was aware tentative d/c date is 10/12. Non-neutropenic Fever: Developed fever overnight on 02/13/19 38.2 that resolved spontaneously. Fevered again to 38.9. Cefepime started, work up ordered. Patient denies cough, SOB, sore throat, dysuria, abdominal pain or diarrhea. Patient had multiple healing red scabbed areas to face, appears better on exam, Vancomycin was not started. 10/9 CT Chest: No acute airspace disease. Trace pericardial and trace bilateral pleural effusions. Multiple prominent lymph nodes throughout the supraclavicular region and mediastinum measuring 1.3 cm, which may be compatible with newly diagnosed AML. 10/9 CT Abdomen/Pelvis with contrast: negative for infection. BCx and UCx remained NGTD. He was transitioned from Cefepime to Levaquin on 10/11 and remained afebrile for 24 hours prior to discharge. He will continue ppx as detailed above.  ??  Possible??GIB: Months long hx of abd pain and dark stools, likely confounded by AML diagnosis and pancytopenia, requiring intermittent blood product transfusions. Pt has also noted BRBPR recently, and remote hx of hemorrhoids.??Home med list includes protonix, ranitidine, bentyl, carafate.??Follows with GI locally? KUB on admission with nonobstructive bowel pattern and moderate stool burden. He was started on a regular bowel regimen and Lactulose 20g TID until BM. He began having BM so this was discontinued. He reported an episode of melana during admission. He was transfused. No other episodes were reported. His Protonix was increased to 40mg  BID and GI was not consulted due to risk vs benefit with an endoscopy. He will complete a 6 week course of Protonix BID.    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 No drainage, erythema or other concerns for infection during admission. Cetirizine daily with Eucerin cream as needed.  ??  CAD, HTN, HLD, s/p STEMI (2010), with stent placement (2010, 2017):??Chronic conditions, follows with [facility]ology?. Reportedly, most recent cardiac cath in 2019 revealed normal LV function, and known stents. Pt uncertain of current medication regimen, but home med list includes: CCB, ACE, diuretics, BB, vasodilators and nitrates, statin. States he has been out of many meds recently and is unsure what he has been taking. States Plavix and Aspirin have been stopped since he was directed to stop these by Dr. Vertell Limber at recent outpatient visit. EKG on admission was NSR. He has not needed NTG tabs lately. Continued home Amlodipine and Metoprolol during admission with NTG PRN. Home ACE and statin held due to concern for TLS risk and drug interactions with chemotherapy. 10/8 Imdur 30 mg added, patient c/o episode of chest pain ON 10/7 (not called to night coverage) that resolved on its own, EKG NSR, no change from prior.  ??  DMT2:??Chronic condition. Home med list included Metformin. Accuchecks 4x/daily and SSI.  SSI was discontinued since patient did not require any coverage during admission.  ??  Pain:??Describes as arthritic pain in knees and hands, intermittent frontal headaches. States he needs surgery on his right knee. Tramadol 25-50 mg for mod-severe pain per pain scale. Voltaren gel to knees scheduled 4x/daily. Tylenol PRN  ??  Anxiety depression:??chronic condition. Home med list included Celexa, Cymbalta. Continued Celexa during admission. 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 Recently living at home alone, however planning to move in with sister after hospital d/c, for support while undergoing tx. Psych consulted given patients frequent refusal of care, patient refusing HCPOA designation paperwork. Pharmacy assisted in reviewing home meds to consolidate prior to discharge.

## 2019-02-17 ENCOUNTER — Ambulatory Visit: Admit: 2019-02-17 | Payer: MEDICARE | Attending: Pharmacist | Primary: Pharmacist

## 2019-02-17 LAB — CBC W/ AUTO DIFF
HEMATOCRIT: 23.7 % — ABNORMAL LOW (ref 41.0–53.0)
HEMOGLOBIN: 7.6 g/dL — ABNORMAL LOW (ref 13.5–17.5)
MEAN CORPUSCULAR HEMOGLOBIN CONC: 31.9 g/dL (ref 31.0–37.0)
MEAN CORPUSCULAR HEMOGLOBIN: 30.8 pg (ref 26.0–34.0)
MEAN CORPUSCULAR VOLUME: 96.6 fL (ref 80.0–100.0)
PLATELET COUNT: 38 10*9/L — ABNORMAL LOW (ref 150–440)
RED CELL DISTRIBUTION WIDTH: 17.1 % — ABNORMAL HIGH (ref 12.0–15.0)

## 2019-02-17 LAB — COMPREHENSIVE METABOLIC PANEL
ALBUMIN: 2.9 g/dL — ABNORMAL LOW (ref 3.5–5.0)
ALKALINE PHOSPHATASE: 47 U/L (ref 38–126)
ALT (SGPT): 20 U/L (ref <50)
ANION GAP: 2 mmol/L — ABNORMAL LOW (ref 7–15)
AST (SGOT): 21 U/L (ref 19–55)
BILIRUBIN TOTAL: 0.9 mg/dL (ref 0.0–1.2)
BLOOD UREA NITROGEN: 38 mg/dL — ABNORMAL HIGH (ref 7–21)
BUN / CREAT RATIO: 47
CALCIUM: 8.2 mg/dL — ABNORMAL LOW (ref 8.5–10.2)
CHLORIDE: 95 mmol/L — ABNORMAL LOW (ref 98–107)
CO2: 39 mmol/L — ABNORMAL HIGH (ref 22.0–30.0)
CREATININE: 0.81 mg/dL (ref 0.70–1.30)
EGFR CKD-EPI NON-AA MALE: >90 mL/min/{1.73_m2} (ref >=60)
GLUCOSE RANDOM: 90 mg/dL (ref 70–179)
POTASSIUM: 3.9 mmol/L (ref 3.5–5.0)
PROTEIN TOTAL: 5.9 g/dL — ABNORMAL LOW (ref 6.5–8.3)
SODIUM: 136 mmol/L (ref 135–145)

## 2019-02-17 LAB — MANUAL DIFFERENTIAL
BASOPHILS - ABS (DIFF): 0 10*9/L (ref 0.0–0.1)
BASOPHILS - REL (DIFF): 3 %
BLASTS - REL (DIFF): 3 % (ref <=0)
EOSINOPHILS - ABS (DIFF): 0 10*9/L (ref 0.0–0.4)
LYMPHOCYTES - ABS (DIFF): 0.5 10*9/L — ABNORMAL LOW (ref 1.5–5.0)
LYMPHOCYTES - REL (DIFF): 51 %
MONOCYTES - ABS (DIFF): 0 10*9/L — ABNORMAL LOW (ref 0.2–0.8)
MONOCYTES - REL (DIFF): 4 %
NEUTROPHILS - REL (DIFF): 39 %

## 2019-02-17 LAB — PHOSPHORUS: Phosphate:MCnc:Pt:Ser/Plas:Qn:: 3

## 2019-02-17 LAB — LACTATE DEHYDROGENASE: Lactate dehydrogenase:CCnc:Pt:Ser/Plas:Qn:Reaction: pyruvate to lactate: 957 — ABNORMAL HIGH

## 2019-02-17 LAB — WBC ADJUSTED: Leukocytes:NCnc:Pt:Bld:Qn:: 0.9 — ABNORMAL LOW

## 2019-02-17 LAB — LYMPHOCYTES - REL (DIFF): Lymphocytes/100 leukocytes:NFr:Pt:Bld:Qn:Manual count: 51

## 2019-02-17 LAB — CO2: Carbon dioxide:SCnc:Pt:Ser/Plas:Qn:: 39 — ABNORMAL HIGH

## 2019-02-17 LAB — MAGNESIUM: Magnesium:MCnc:Pt:Ser/Plas:Qn:: 1.9

## 2019-02-17 LAB — URIC ACID: Urate:MCnc:Pt:Ser/Plas:Qn:: 3.9 — ABNORMAL LOW

## 2019-02-17 MED ORDER — LEVOFLOXACIN 500 MG PO TABS
500.00 | ORAL_TABLET | ORAL | Status: DC
Start: 2019-02-18 — End: 2019-02-17

## 2019-02-17 MED ORDER — PANTOPRAZOLE SODIUM 40 MG PO TBEC
40.00 | DELAYED_RELEASE_TABLET | ORAL | Status: DC
Start: 2019-02-17 — End: 2019-02-17

## 2019-02-17 MED ORDER — GENERIC EXTERNAL MEDICATION
Status: DC
Start: ? — End: 2019-02-17

## 2019-02-17 MED ORDER — SENNOSIDES 8.6 MG TABLET: 2 | tablet | Freq: Every evening | 0 refills | 30 days | Status: AC

## 2019-02-17 MED ORDER — DOCUSATE SODIUM 100 MG CAPSULE: 100 mg | capsule | Freq: Every day | 0 refills | 30 days | Status: AC

## 2019-02-17 MED ORDER — LEVOFLOXACIN 500 MG TABLET: 500 mg | tablet | Freq: Every day | 6 refills | 30 days | Status: AC

## 2019-02-17 MED ORDER — PREDNISONE 20 MG TABLET: tablet | 0 refills | 11 days | Status: AC

## 2019-02-17 MED ORDER — VALACYCLOVIR 500 MG TABLET: 500 mg | tablet | Freq: Every day | 0 refills | 30 days | Status: AC

## 2019-02-17 MED ORDER — VENETOCLAX 100 MG TABLET: 100 mg | tablet | Freq: Every day | 2 refills | 30 days | Status: AC

## 2019-02-17 MED ORDER — ISOSORBIDE MONONITRATE ER 30 MG TABLET,EXTENDED RELEASE 24 HR: 30 mg | tablet | Freq: Every day | 0 refills | 30 days | Status: AC

## 2019-02-17 MED ORDER — PANTOPRAZOLE 40 MG TABLET,DELAYED RELEASE: 40 mg | tablet | Freq: Two times a day (BID) | 0 refills | 30 days | Status: AC

## 2019-02-17 MED ORDER — CITALOPRAM 20 MG TABLET: 20 mg | tablet | Freq: Every day | 0 refills | 30 days | Status: AC

## 2019-02-17 MED ORDER — ONDANSETRON HCL 4 MG TABLET: 4 mg | tablet | Freq: Three times a day (TID) | 0 refills | 10 days | Status: AC

## 2019-02-17 MED ORDER — METOPROLOL SUCCINATE ER 25 MG TABLET,EXTENDED RELEASE 24 HR: 25 mg | tablet | Freq: Every day | 0 refills | 30 days | Status: AC

## 2019-02-17 MED ORDER — ALBUTEROL SULFATE HFA 90 MCG/ACTUATION AEROSOL INHALER: 2 | g | 0 refills | 0 days | Status: AC

## 2019-02-17 MED ORDER — AMLODIPINE 5 MG TABLET: 5 mg | tablet | Freq: Every day | 0 refills | 30 days | Status: AC

## 2019-02-17 MED FILL — ONDANSETRON HCL 4 MG TABLET: 10 days supply | Qty: 30 | Fill #0 | Status: AC

## 2019-02-17 MED FILL — PREDNISONE 20 MG TABLET: 11 days supply | Qty: 10 | Fill #0 | Status: AC

## 2019-02-17 MED FILL — LEVOFLOXACIN 500 MG TABLET: 30 days supply | Qty: 30 | Fill #0 | Status: AC

## 2019-02-17 MED FILL — AMLODIPINE 5 MG TABLET: ORAL | 30 days supply | Qty: 30 | Fill #0

## 2019-02-17 MED FILL — CITALOPRAM 20 MG TABLET: ORAL | 30 days supply | Qty: 30 | Fill #0

## 2019-02-17 MED FILL — DOK 100 MG CAPSULE: 30 days supply | Qty: 30 | Fill #0 | Status: AC

## 2019-02-17 MED FILL — VALACYCLOVIR 500 MG TABLET: ORAL | 30 days supply | Qty: 30 | Fill #0

## 2019-02-17 MED FILL — AMLODIPINE 5 MG TABLET: 30 days supply | Qty: 30 | Fill #0 | Status: AC

## 2019-02-17 MED FILL — ISOSORBIDE MONONITRATE ER 30 MG TABLET,EXTENDED RELEASE 24 HR: 30 days supply | Qty: 30 | Fill #0 | Status: AC

## 2019-02-17 MED FILL — PANTOPRAZOLE 40 MG TABLET,DELAYED RELEASE: ORAL | 30 days supply | Qty: 60 | Fill #0

## 2019-02-17 MED FILL — METOPROLOL SUCCINATE ER 25 MG TABLET,EXTENDED RELEASE 24 HR: ORAL | 30 days supply | Qty: 30 | Fill #0

## 2019-02-17 MED FILL — SENNA LAX 8.6 MG TABLET: 30 days supply | Qty: 60 | Fill #0 | Status: AC

## 2019-02-17 MED FILL — ONDANSETRON HCL 4 MG TABLET: ORAL | 10 days supply | Qty: 30 | Fill #0

## 2019-02-17 MED FILL — CITALOPRAM 20 MG TABLET: 30 days supply | Qty: 30 | Fill #0 | Status: AC

## 2019-02-17 MED FILL — PANTOPRAZOLE 40 MG TABLET,DELAYED RELEASE: 30 days supply | Qty: 60 | Fill #0 | Status: AC

## 2019-02-17 MED FILL — SENNA LAX 8.6 MG TABLET: ORAL | 30 days supply | Qty: 60 | Fill #0

## 2019-02-17 MED FILL — PREDNISONE 20 MG TABLET: ORAL | 11 days supply | Qty: 10 | Fill #0

## 2019-02-17 MED FILL — LEVOFLOXACIN 500 MG TABLET: ORAL | 30 days supply | Qty: 30 | Fill #0

## 2019-02-17 MED FILL — ISOSORBIDE MONONITRATE ER 30 MG TABLET,EXTENDED RELEASE 24 HR: ORAL | 30 days supply | Qty: 30 | Fill #0

## 2019-02-17 MED FILL — DOK 100 MG CAPSULE: ORAL | 30 days supply | Qty: 30 | Fill #0

## 2019-02-17 MED FILL — VALACYCLOVIR 500 MG TABLET: 30 days supply | Qty: 30 | Fill #0 | Status: AC

## 2019-02-17 MED FILL — METOPROLOL SUCCINATE ER 25 MG TABLET,EXTENDED RELEASE 24 HR: 30 days supply | Qty: 30 | Fill #0 | Status: AC

## 2019-02-17 NOTE — Unmapped (Signed)
Physician Discharge Summary Raymond [name]  5 [facility]C [facility]  [address]  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 *      Outpatient Provider Follow Up Issues:   Supportive Care Recommendations:  We recommend based on the patient???s underlying diagnosis and treatment history the following supportive care:    1. Antimicrobial prophylaxis:  AML (not in remission): Bacterial: Levofloxacin 500mg  PO daily (absolute neutrophils >/= 0.5 until absolute neutrophils >/= 0.5);   Fungal: AML fungal: Posaconazole 300mg  PO daily (absolute neutrophils >/= 0.5 until absolute neutrophils >/= 0.5) ;   Viral: Valacyclovir 500mg  PO daily (continuous)    2. Blood product support:  Leukoreduced blood products are required.  Irradiated blood products are preferred, but in case of urgent transfusion needs non-irradiated blood products may be used:     -  RBC transfusion threshold: transfuse 1 units for Hgb < 7 g/dL.  -  Platelet transfusion threshold: transfuse 1 unit of platelets for platelet count < 10, or for bleeding or need for invasive procedure. Based on the patient's disease status and intensity of therapy, complete blood count with differential should be evaluated 2 times per week and used to guide transfusion support    3. Hematopoietic growth factor support: none    Hospital Course:   Raymond [name]??is a 64 y.o.??male??with newly diagnosed AML??and PMHx of NSTEMI/CAD s/t stents, DMT2, HLD, HTN, COPD, depression/anxiety, mild cognitive impairment??, who is admitted for??initiation of tx with Azacitidine and Venetoclax. He tolerated treatment well. His course was complicated by non-neutropenic fever and likely COPD exacerbation requiring brief transfer to step-down unit. Fever work up was negative with CT chest and CT abdomen pelvis negative for infection. BCx were NGTD at discharge. He was transitioned from Cefepime to Levaquin and remained afebrile for 24 hours prior to discharge. He was started on 5 day course of Prednisone 40mg  for COPD exacerbation. He was discharged on a Prednisone taper as detailed below. His O2 requirement returned to his baseline 2L. His Protonix was increased to 40mg  BID due to concern for GI bleed and benefit vs risk for endoscopy. Plan to continue Protonix BID for 6 week course. He will discharge on Levaquin, Valtrex, and Posa ppx. He will start Posaconazole on 10/13 and decrease his dose of Venetoclax to 100mg . It was confirmed that patient had both of these medications prior to discharge. He was also discharged with 30 day supply of all home medications that were continued during admission. He also received 2 units of RBCs on day of discharge. He has a follow  up scheduled with Dr. Vertell Limber on 10/15 at [facility]. He has labs twice weekly at [facility] starting 10/15. Detailed hospital course as below: AML, with MRC, complex karyotype, TP53 mutation: Presented to PCP with abd pain and fatigue, labs revealed pancytopenia. Diagnostic BMBx confirmed AML with TP53 mutation. ??Multiple tx regimens were discussed and decided to proceed with Azacitidine and Venetoclax. Plan for BMBx s/p C1. Patient refused admission COVID screening numerous times despite education and risk/benefit discussion. C1D1=10/6. Venetoclax PO daily ramp up as follows: 100 mg D1, 200 mg D2, 400 mg D3-7, then 100 mg D8 and forward (d/t posaconazole start). He was started on Allopurinol daily with BID TLS lab monitoring. He had no signs of TLS. He was started on Valtrex ppx. He was transitioned to Levaquin ppx following Cefepime. He will start Posaconazole on 10/13 and decrease his dose of Venetoclax to 100mg . It was confirmed that patient had both of these medications prior to discharge. He will discharge on Levaquin, Valtrex, and Posa ppx. He will start Posaconazole on 10/13 and decrease his dose of Venetoclax to 100mg . It was confirmed that patient had both of these medications prior to discharge. He was also discharged with 30 day supply of all home medications that were continued during admission. He also received 2 units of RBCs on day of discharge. He has a follow up scheduled with Dr. Vertell Limber on 10/15 at [facility]. He has labs twice weekly at [facility] starting 10/15.  ??  Disposition  - 10/15 Labs at 2:30pm followed by 3:30pm appt w/Dr. Vertell Limber  - 10/19 Labs 12pm followed by appt w/[name] at 1 pm (D15 appointment)  - labs with transfusion support 2x/weekly starting 10/15 scheduled  - Katherine coordinating [facility] in [place] area for medication management   - 10/29 BmBx scheduled   - Follow up with Oliver Hum, NP scheduled 11/5 COPD Exacerbation:??Reported chronic condition. Uses Symbicort and albuterol inhalers intermittently. Reports recent addition of home O2 due to low O2 level, not needing on admission. CXR on admission with generally clear lungs with possible mild edema. Continued Symbicort during admission. 10/9 Rapid called overnight for desat's 70s, though exam w/only mild dyspnea, transitioned to [facility] for BIPAP (worse for about 4 hours), transitioned to 5L [place] this AM, sp02 upper 90's. S/p 20 mg Lasix IVP, CT chest with small bilateral pleural effusions, small pericardial effusion, otherwise negative. Net neg 3L, weight slightly down from admission, BLE swelling improved. Likely COPD exacerbation vs fluid overload. VBG with compensated Ph 7.34, chronic c02 retention in 70s. He was started on 40 mg Prednisone daily x 5 days (10/9- ), Duo-neb Q4H scheduled. He continued Lasix 40mg  daily PO. He was discharged on Prednisone taper 40mg  x1 day (to complete 5 day course), 20mg  daily x5 days then 10mg  daily x5 days. Sister [name]) to pick up patients 02 in [place] prior to picking him up on day of discharge, she was aware tentative d/c date is 10/12. Non-neutropenic Fever: Developed fever overnight on 02/13/19 38.2 that resolved spontaneously. Fevered again to 38.9. Cefepime started, work up ordered. Patient denies cough, SOB, sore throat, dysuria, abdominal pain or diarrhea. Patient had multiple healing red scabbed areas to face, appears better on exam, Vancomycin was not started. 10/9 CT Chest: No acute airspace disease. Trace pericardial and trace bilateral pleural effusions. Multiple prominent lymph nodes throughout the supraclavicular region and mediastinum measuring 1.3 cm, which may be compatible with newly diagnosed AML. 10/9 CT Abdomen/Pelvis with contrast: negative for infection. BCx and UCx remained NGTD. He  was transitioned from Cefepime to Levaquin on 10/11 and remained afebrile for 24 hours prior to discharge. He will continue ppx as detailed above.  ??  Possible??GIB: Months long hx of abd pain and dark stools, likely confounded by AML diagnosis and pancytopenia, requiring intermittent blood product transfusions. Pt has also noted BRBPR recently, and remote hx of hemorrhoids.??Home med list includes protonix, ranitidine, bentyl, carafate.??Follows with GI locally? KUB on admission with nonobstructive bowel pattern and moderate stool burden. He was started on a regular bowel regimen and Lactulose 20g TID until BM. He began having BM so this was discontinued. He reported an episode of melana during admission. He was transfused. No other episodes were reported. His Protonix was increased to 40mg  BID and GI was not consulted due to risk vs benefit with an endoscopy. He will complete a 6 week course of Protonix BID.    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 No drainage, erythema or other concerns for infection during admission. Cetirizine daily with Eucerin cream as needed.  ??  CAD, HTN, HLD, s/p STEMI (2010), with stent placement (2010, 2017):??Chronic conditions, follows with [facility]ology?. Reportedly, most recent cardiac cath in 2019 revealed normal LV function, and known stents. Pt uncertain of current medication regimen, but home med list includes: CCB, ACE, diuretics, BB, vasodilators and nitrates, statin. States he has been out of many meds recently and is unsure what he has been taking. States Plavix and Aspirin have been stopped since he was directed to stop these by Dr. Vertell Limber at recent outpatient visit. EKG on admission was NSR. He has not needed NTG tabs lately. Continued home Amlodipine and Metoprolol during admission with NTG PRN. Home ACE and statin held due to concern for TLS risk and drug interactions with chemotherapy. 10/8 Imdur 30 mg added, patient c/o episode of chest pain ON 10/7 (not called to night coverage) that resolved on its own, EKG NSR, no change from prior.  ??  DMT2:??Chronic condition. Home med list included Metformin. Accuchecks 4x/daily and SSI. SSI was discontinued since patient did not require any coverage during admission.  ??  Pain:??Describes as arthritic pain in knees and hands, intermittent frontal headaches. States he needs surgery on his right knee. Tramadol 25-50 mg for mod-severe pain per pain scale. Voltaren gel to knees scheduled 4x/daily. Tylenol PRN  ??  Anxiety depression:??chronic condition. Home med list included Celexa, Cymbalta. Continued Celexa during admission. 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 Recently living at home alone, however planning to move in with sister after hospital d/c, for support while undergoing tx. Psych consulted given patients frequent refusal of care, patient refusing HCPOA designation paperwork. Pharmacy assisted in reviewing home meds to consolidate prior to discharge.      Procedures:  Chemotherapy  No admission procedures for hospital encounter.  ______________________________________________________________________  Discharge Medications:     Your Medication List      STOP taking these medications    allopurinoL 300 MG tablet  Commonly known as: ZYLOPRIM     atorvastatin 20 MG tablet  Commonly known as: LIPITOR     clobetasoL 0.05 % ointment  Commonly known as: TEMOVATE     clotrimazole 1 % cream  Commonly known as: LOTRIMIN     diphenhydrAMINE 25 mg capsule  Commonly known as:  BENADRYL     docusate sodium 100 mg tablet  Replaced by: DOK 100 MG capsule     mupirocin 2 % ointment  Commonly known as: BACTROBAN     ranitidine 15 mg/mL syrup  Commonly known as: ZANTAC     sucralfate 100 mg/mL suspension  Commonly known as: CARAFATE        START taking these medications    DOK 100 MG capsule  Generic drug: docusate sodium  Take 1 capsule (100 mg total) by mouth daily.  Replaces: docusate sodium 100 mg tablet     ondansetron 4 MG tablet  Commonly known as: ZOFRAN  Take 1 tablet (4 mg total) by mouth every eight (8) hours as needed for nausea for up to 30 doses.     predniSONE 20 MG tablet  Commonly known as: DELTASONE  Take 2 tablets (40 mg total) by mouth daily for 1 day, THEN 1 tablet (20 mg total) daily for 5 days, THEN 0.5 tablets (10 mg total) daily for 5 days.  Start taking on: February 18, 2019        CHANGE how you take these medications    pantoprazole 40 MG tablet  Commonly known as: PROTONIX  Take 1 tablet (40 mg total) by mouth Two (2) times a day.  What changed: when to take this     SENNA LAX 8.6 mg tablet Generic drug: senna  Take 2 tablets by mouth nightly.  What changed:   ?? when to take this  ?? reasons to take this     venetoclax 100 mg tablet  Commonly known as: VENCLEXTA  Take 1 tablet (100 mg total) by mouth daily. Take with a meal and water. Do not chew, crush, or break tablets.  What changed: how much to take        CONTINUE taking these medications    acetaminophen 650 MG CR tablet  Commonly known as: TYLENOL 8 HOUR  Take 1 tablet (650 mg total) by mouth every eight (8) hours as needed for pain.     albuterol 90 mcg/actuation inhaler  Commonly known as: PROVENTIL HFA;VENTOLIN HFA  Inhale 2 puffs every four (4) hours as needed.     amLODIPine 5 MG tablet  Commonly known as: NORVASC  Take 1 tablet (5 mg total) by mouth daily.  Start taking on: February 18, 2019     citalopram 20 MG tablet  Commonly known as: CeleXA  Take 1 tablet (20 mg total) by mouth daily.     diclofenac sodium 1 % gel  Commonly known as: VOLTAREN  Apply 2 g topically.     dicyclomine 10 mg capsule  Commonly known as: BENTYL  Take 10 mg by mouth Two (2) times a day.     FLECTOR 1.3 % Pt12  Generic drug: diclofenac epolamine  Place 1 patch on the skin daily.     furosemide 40 MG tablet  Commonly known as: LASIX  TAKE ONE TABLET BY MOUTH EVERY DAY     isosorbide mononitrate 30 MG 24 hr tablet  Commonly known as: IMDUR  Take 1 tablet (30 mg total) by mouth daily.     levoFLOXacin 500 MG tablet  Commonly known as: LEVAQUIN  Take 1 tablet (500 mg total) by mouth daily.     metoprolol succinate 25 MG 24 hr tablet  Commonly known as: TOPROL-XL  Take 1 tablet (25 mg total) by mouth daily.     nitroglycerin 0.4 MG SL tablet  Commonly  known as: NITROSTAT  Place 1 tablet (0.4 mg total) under the tongue every five (5) minutes as needed for chest pain.     posaconazole 100 mg Tbec delayed released tablet  Commonly known as: NOXAFIL  Take 3 tablets (300 mg) by mouth daily.     SYMBICORT 160-4.5 mcg/actuation inhaler  Generic drug: budesonide-formoteroL INHALE TWO PUFFS BY MOUTH TWICE DAILY     traMADoL 50 mg tablet  Commonly known as: ULTRAM  Take 1 tablet (50 mg total) by mouth every eight (8) hours as needed for pain.     valACYclovir 500 MG tablet  Commonly known as: VALTREX  Take 1 tablet (500 mg total) by mouth daily.            Allergies:  Patient has no known allergies.  ______________________________________________________________________  Pending Test Results (if blank, then none):  Pending Labs     Order Current Status    Blood Culture Preliminary result    Blood Culture, Adult Preliminary result          Most Recent Labs:  All lab results last 24 hours -   Recent Results (from the past 24 hour(s))   Magnesium Level    Collection Time: 02/17/19  6:31 AM   Result Value Ref Range    Magnesium 1.9 1.6 - 2.2 mg/dL   Comprehensive Metabolic Panel    Collection Time: 02/17/19  6:31 AM   Result Value Ref Range    Sodium 136 135 - 145 mmol/L    Potassium 3.9 3.5 - 5.0 mmol/L    Chloride 95 (L) 98 - 107 mmol/L    Anion Gap 2 (L) 7 - 15 mmol/L    CO2 39.0 (H) 22.0 - 30.0 mmol/L    BUN 38 (H) 7 - 21 mg/dL    Creatinine 1.61 0.96 - 1.30 mg/dL    BUN/Creatinine Ratio 47     EGFR CKD-EPI Non-African American, Male >90 >=60 mL/min/1.47m2    EGFR CKD-EPI African American, Male >90 >=60 mL/min/1.56m2    Glucose 90 70 - 179 mg/dL    Calcium 8.2 (L) 8.5 - 10.2 mg/dL    Albumin 2.9 (L) 3.5 - 5.0 g/dL    Total Protein 5.9 (L) 6.5 - 8.3 g/dL    Total Bilirubin 0.9 0.0 - 1.2 mg/dL    AST 21 19 - 55 U/L    ALT 20 <50 U/L    Alkaline Phosphatase 47 38 - 126 U/L   Lactate dehydrogenase    Collection Time: 02/17/19  6:31 AM   Result Value Ref Range    LDH 957 (H) 338 - 610 U/L   Phosphorus Level    Collection Time: 02/17/19  6:31 AM   Result Value Ref Range    Phosphorus 3.0 2.9 - 4.7 mg/dL   Uric acid    Collection Time: 02/17/19  6:31 AM   Result Value Ref Range    Uric Acid 3.9 (L) 4.0 - 9.0 mg/dL   CBC w/ Differential    Collection Time: 02/17/19  6:31 AM Result Value Ref Range    WBC 0.9 (L) 4.5 - 11.0 10*9/L    RBC 2.45 (L) 4.50 - 5.90 10*12/L    HGB 7.6 (L) 13.5 - 17.5 g/dL    HCT 04.5 (L) 40.9 - 53.0 %    MCV 96.6 80.0 - 100.0 fL    MCH 30.8 26.0 - 34.0 pg    MCHC 31.9 31.0 - 37.0 g/dL    RDW 81.1 (H)  12.0 - 15.0 %    MPV 9.8 7.0 - 10.0 fL    Platelet 38 (L) 150 - 440 10*9/L    Neutrophil Left Shift 1+ (A) Not Present    Macrocytosis Moderate (A) Not Present    Anisocytosis Slight (A) Not Present    Hypochromasia Slight (A) Not Present   Manual Differential    Collection Time: 02/17/19  6:31 AM   Result Value Ref Range    Neutrophils % 39 %    Lymphocytes % 51 %    Monocytes % 4 %    Eosinophils % 0 %    Basophils % 3 %    Blasts % 3 (HH) <=0 %    Absolute Neutrophils 0.4 (LL) 2.0 - 7.5 10*9/L    Absolute Lymphocytes 0.5 (L) 1.5 - 5.0 10*9/L    Absolute Monocytes 0.0 (L) 0.2 - 0.8 10*9/L    Absolute Eosinophils 0.0 0.0 - 0.4 10*9/L    Absolute Basophils 0.0 0.0 - 0.1 10*9/L    Smear Review Comments See Comment (A) Undefined    Toxic Vacuolation Present (A) Not Present   Prepare RBC    Collection Time: 02/17/19 12:00 PM   Result Value Ref Range    Crossmatch Compatible     Unit Blood Type A Pos     ISBT Number 6200     Unit # [id number]     Status Issued     Spec Expiration [id number]     Product ID Red Blood Cells     PRODUCT CODE [id number]        Relevant Studies/Radiology (if blank, then none):  Ecg 12 Lead    Result Date: 02/15/2019  NORMAL SINUS RHYTHM LOW VOLTAGE QRS BORDERLINE ECG WHEN COMPARED WITH ECG OF 11-Feb-2019 19:21, NO SIGNIFICANT CHANGE WAS FOUND Confirmed by Huel Coventry [phone]) on 02/15/2019 10:09:28 AM    Ecg 12 Lead    Result Date: 02/12/2019  NORMAL SINUS RHYTHM NORMAL ECG NO PREVIOUS ECGS AVAILABLE Confirmed by Mariane Baumgarten ([id number]) on 02/12/2019 8:14:59 AM    Xr Chest Portable    Result Date: 02/12/2019 EXAM: XR CHEST PORTABLE DATE: 02/12/2019 2:02 AM ACCESSION: [id number] UN DICTATED: 02/12/2019 4:03 AM [facility]PRET[facility] CLINICAL INDICATION: 63 years old Male with HYPOXEMIA  COMPARISON: 02/11/2019 TECHNIQUE: Portable Chest Radiograph. FINDINGS: No focal consolidation. Prominent interstitial opacities, similar to prior. Left basilar and retrocardiac opacity compared to prior. Peribronchial cuffing. No pleural effusion or pneumothorax. Stable cardiomediastinal silhouette.     Left basilar atelectasis.     Xr Chest Portable    Result Date: 02/11/2019  EXAM: XR CHEST PORTABLE DATE: ACCESSION: [id number] UN DICTATED: 02/11/2019 4:36 PM [facility]PRETATION LOCATION: [facility] CLINICAL INDICATION: 63 years old Male with CHRONIC AIRWAY OBSTRUCTION  COMPARISON: 11/18/2016. TECHNIQUE: Portable Chest Radiograph. FINDINGS: No focal lung consolidation. Possible mild edema. Possible trace bilateral pleural effusions. No pneumothorax. Stable cardiomediastinal silhouette.     No focal consolidation. Possible mild edema.    Ct Chest Wo Contrast    Result Date: 02/14/2019 EXAM: CT CHEST WO CONTRAST DATE: 02/14/2019 1:49 AM ACCESSION: [id number] UN DICTATED: 02/14/2019 2:06 AM [facility]PRETATION LOCATION: [facility] CLINICAL INDICATION: 64 years old Male with hypoxia  COMPARISON: 02/12/2019 chest radiograph TECHNIQUE: A helical CT scan was obtained without IV contrast from the thoracic inlet through the hemidiaphragms. Images were reconstructed in the axial plane.  Coronal and sagittal reformatted images of the chest were also provided for further evaluation of the lung parenchyma. FINDINGS: AIRWAYS, LUNGS, PLEURA: Clear central tracheobronchial  tree.  No lung consolidation.  Trace bilateral pleural effusions. Bibasilar atelectasis. MEDIASTINUM: Normal heart size. Trace pericardial effusion. Normal caliber thoracic aorta. Coronary calcifications. Multiple prominent lymph nodes throughout the supraclavicular region and mediastinum measuring up to 1.3 cm. IMAGED ABDOMEN: Cholecystectomy. SOFT TISSUES: Unremarkable. BONES: Unremarkable.     No acute airspace disease. Trace pericardial and trace bilateral pleural effusions. Multiple prominent lymph nodes throughout the supraclavicular region and mediastinum measuring 1.3 cm, which may be compatible with newly diagnosed AML. Addendum by Dr. Royanne Foots on 02/14/2019 at 7:15 A.M. Minimal interlobular septal thickening in bilateral upper lobes, diffuse mild peribronchovascular interstitial thickening, minimal fluid in bilateral major fissures, small pericardial effusion and mild diffuse fat stranding; consistent with pulmonary edema/volume overload status.     Ct Abdomen Pelvis W Contrast    Result Date: 02/14/2019 EXAM: CT ABDOMEN PELVIS W CONTRAST DATE: 02/14/2019 2:58 PM ACCESSION: [id number] UN DICTATED: 02/14/2019 3:25 PM [facility]PRETATION [facility]ATION: [facility] CLINICAL INDICATION: Fever, abdominal pain, also hx of GIB  COMPARISON: 12/01/2016 TECHNIQUE: A spiral CT scan of the abdomen and pelvis was obtained with IV contrast from the lung bases through the pubic symphysis. Images were reconstructed in the axial plane. Coronal and sagittal reformatted images were also provided for further evaluation. FINDINGS: LINES AND TUBES: None. LOWER THORAX: Please see same day chest CT for findings above the diaphragm. HEPATOBILIARY: Hepatomegaly measuring 18 cm in craniocaudal dimension. No focal hepatic lesions. The gallbladder is surgically absent. No biliary dilatation.  SPLEEN: Mild splenomegaly measuring 12 cm in greatest craniocaudal dimension. PANCREAS: Unremarkable. ADRENALS: Unremarkable. KIDNEYS/URETERS: Symmetric nephrograms. No hydronephrosis. Exophytic focal hypoattenuating lesion in the superior pole of the left kidney measuring 1.7 cm (2:50), previously 1.2 cm. 1.3 cm exophytic renal cyst in the superior pole right kidney (3:87), most likely representing a renal cyst. Subcentimeter hypodensity in the inferior pole of the right kidney is too small to characterize on CT. BLADDER: Limited evaluation due to under distention. PELVIC/REPRODUCTIVE ORGANS: Prostatic calcifications. GI TRACT: No dilated or thick walled loops of bowel. The appendix is surgically absent. Sequelae of chronic epiploic appendagitis in the descending colon (2:122). PERITONEUM/RETROPERITONEUM AND MESENTERY: No free air or fluid. LYMPH NODES: Prominent lymph node in the gastrohepatic ligament measures 1.3 cm (2:32). Additional 1.4 cm Porta caval node (2:48). VESSELS: The aorta is normal in caliber.  No significant calcified atherosclerotic disease. Accessory left hepatic artery originates from the abdominal aorta. The portal venous system is patent. The hepatic veins and IVC are unremarkable. BONES AND SOFT TISSUES: Degenerative spine disease. No acute or suspicious osseous abnormalities.     -Hepatosplenomegaly -Multiple unchanged bilateral hypoattenuating renal lesions. -Nonspecific, enlarged periaortic and portacaval lymph nodes. -Please see same day chest CT for findings above the diaphragm    Xr Abdomen 1 View    Result Date: 02/13/2019  EXAM: XR ABDOMEN 1 VIEW DATE: 02/13/2019 9:49 AM ACCESSION: [id number] UN DICTATED: 02/13/2019 10:21 AM INTERPRETATION LOCATION: [facility] CLINICAL INDICATION: 63 years old Male with ABDOMINAL PAIN  -  UNSPECIFIED SITE  COMPARISON: abdominal radiograph from 02/11/2019 TECHNIQUE: Supine view of the abdomen. FINDINGS: Paucity of bowel gas limits evaluation of bowel gas pattern. Visceral contours are unremarkable. No abnormal calcifications. Clips project over the right upper quadrant and right hemipelvis. Patchy linear opacities in the lung bases bilaterally, likely atelectasis. The visualized cardiac silhouette is enlarged, although this may be projectional. No acute soft tissue or osseous abnormalities.     Paucity of bowel gas limits evaluation of bowel gas pattern, although no definite evidence  of obstruction.     Xr Abdomen 1 View    Result Date: 02/11/2019  EXAM: XR ABDOMEN 1 VIEW DATE: 02/11/2019 4:37 PM ACCESSION: [id number] UN DICTATED: 02/11/2019 4:58 PM INTERPRETATION LOCATION: [facility] CLINICAL INDICATION: 63 years old Male with ABDOMINAL PAIN  -  UNSPECIFIED SITE  COMPARISON: CT abdomen pelvis from 12/01/2016. TECHNIQUE: Supine view of the abdomen. FINDINGS: Nonobstructed bowel gas pattern. Moderate colonic stool burden. Cholecystectomy clips. Surgical clips project over the right hemipelvis. No acute osseous abnormalities. Evaluation of the lung bases is slightly limited by overpenetration. No overt pleural fluid or air. The cardiac silhouette is enlarged. Nonobstructed bowel gas pattern. Moderate colonic stool burden.    ______________________________________________________________________  Discharge Instructions:   Please make sure you have a functioning thermometer at home.  If you are feeling poorly, especially if you have chills, shaking, muscle aches or lightheadedness, measure your temperature. If it is more than 100.5 Farenheit, call the nurse triage line during daytime hours (Monday through Friday 8AM???5PM: [phone]) or on nights and weekends, the on-call doctor by calling the hospital operator [phone]) and asking for the on-call adult oncologist. Alternatively, since fever after chemotherapy may be a medical emergency, you may proceed directly to your local emergency room. Inform your provider that you recently received chemotherapy. You may have blood drawn for blood cultures and receive IV antibiotics.    Following discharge from the hospital if you notice the development or worsening of any symptoms such as nausea, vomiting, chest pain, shortness of breath, fevers, or chills, please return to the emergency department.      If you develop these symptoms, or if you have trouble obtaining any of your medications you may call the [facility] Communication Center to speak with the triage team at [phone] if Monday through Friday 8am-5pm or call [phone] after hours.      For appointments & questions Monday through Friday 8 AM??? 5 PM   please call [phone] or Toll free [phone].    On Nights, Weekends and Holidays  Call [phone] and ask for the oncologist on call.    [facility]  [address]  [place], [place] [address]  [email or web address]               Other Instructions     Call MD for:  difficulty breathing, headache or visual disturbances      Call MD for:  extreme fatigue      Call MD for:  hives      Call MD for:  persistent dizziness or light-headedness      Call MD for:  persistent nausea or vomiting Call MD for:  redness, tenderness, or signs of infection (pain, swelling, redness, odor or green/yellow discharge around incision site)      Call MD for:  severe uncontrolled pain      Call MD for: Temperature > 38.5 Celsius ( > 101.3 Fahrenheit)      Discharge instructions      Raymond [name], 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 You received cycle 1 of Azacitidine and Venetoclax. You tolerated treatment well. Your admission was complicated by a fever and COPD exacerbation. You received IV antibiotics. Your blood cultures did not grow any bacteria and there was no source of infection found. IV antibiotics were discontinued and an oral antibiotic was started for prevention of infection. You were started on a steroid for your COPD exacerbation. Your breathing improved and you were weaned to  your normal oxygen amount.    You should take Venetoclax 100mg  daily (1 tablet). This is your chemotherapy pill. You should take this with a meal and drink water. You need to take this STARTING tonight, 10/12    You should take the following medications to decrease the risk of infection:  1) Valtrex 500mg  daily to prevent viral infections  2) Levaquin 500mg  daily to prevent bacterial infections  3) Posaconazole 300mg  daily to prevent fungal infections. You should START this tomorrow, 10/13    I am also prescribing:  1) Prednisone taper. You will take 40mg  (2 tablets) daily for 1 day STARTING tomorrow 10/13. You will then take 20mg  daily (1 tablet) for 5 days (10/14-10/18). You will then take 10mg  daily (0.5mg  tablet) for 5 days (10/19-10/23).    2) Protonix 40mg  twice a day. This is to help with possible GI bleed caused by an ulcer  3) Metoprolol 25mg  daily for your blood pressure  4) Amlodipine 5mg  daily for your blood pressure  5) Lasix 40mg  daily for swelling  6) Celexa 20mg  daily for anxiety/depression  7) Senna 2 tablets nightly for constipation  8) Colace 100mg  daily for constipation 9) Zofran 4mg  every 8 hours ONLY as needed if you have any nausea or vomiting    You are scheduled to have your labs checked twice weekly on Monday's and Thursday's at [facility] starting 10/15    You are also scheduled to see Dr. Vertell Limber at [facility] on 10/15.    Your blood counts are low, so it is good to adhere to the following precautions:    Infection precautions:  - Wash your hands frequently with soap and water  - Take your temperature when you have chills or are not feeling well  - Speak with your doctor before having any dental work done    - Limit exposure to pet feces (e.g., litter box)    - Avoid people who have colds or the flu    - Avoid anyone who has received a live vaccination (shot) within the last three weeks    Bleeding precautions:   - Try to avoid getting cuts, punctures or scratches on your skin   - Avoid contact sports, horseback riding    Mouth Care   - Use a soft toothbrush. Use an oral swab or special soft toothbrush if your gums bleed during regular brushing.   - Don't use dental floss if your platelet count is below 50,000.    - Avoid mouthwashes that contain alcohol  - If you can't tolerate regular methods, use salt and baking soda to clean your mouth. Mix 1 teaspoon(s) of salt and 1 teaspoon(s) of baking soda into an 8-ounce glass of warm water. Swish and spit.  - Watch your mouth and tongue for white patches. This is a sign of fungal infection, a common side effect of chemotherapy. Be sure to tell your doctor about these patches. Medication can be prescribed to help you fight the fungal infection    Other precautions:  - Prevent constipation. Contact your provider if you become constipated.  - Drink lots of fluids (water, juice, etc.) to prevent dehydration  - Maintain a well-balanced diet and eat healthy foods, but avoid raw or uncooked foods, including raw vegetables and fruits During this COVID-19 outbreak, please avoid and or minimize close contact with members outside of your immediate family. If you must be in public wear a mask and remain 6 feet from others.  Avoid large crowds or  situations with close contact of multiple people. REMEMBER to wash your hands frequently and increase this frequency when in public.     Please make sure you have a functioning thermometer at home.?? If you are feeling poorly, especially if you have chills, shaking, muscle aches or lightheadedness, measure your temperature. If it is more than 100.4 Farenheit, call the nurse triage line during daytime hours (Monday through Friday 8AM - 5PM: [phone]) or on nights and weekends, the on-call doctor by calling the hospital operator [phone]) and asking for the on-call adult oncologist. Alternatively, if you develop a fever and you are currently taking or have recently received chemotherapy, you may proceed directly to your local emergency room as this??can be a medical emergency. Be sure to inform the emergency room provider if you have recently received chemotherapy. You may have blood drawn for blood cultures and receive IV antibiotics.    Following discharge from the hospital, if you develop or notice worsening of any symptoms such as nausea, vomiting, chest pain, shortness of breath, fevers, or chills, please return to the emergency department.??     If you develop these symptoms, or if you have trouble obtaining any of your medications you may call the [facility] Communication Center to speak with the triage team at [phone] if Monday through Friday 8am-5pm or call [phone] after hours.      For appointments & questions Monday through Friday 8 AM - 5 PM   please call [phone] or toll free [phone].    On Nights, Weekends and Holidays  Call [phone] and ask for the oncologist on call.    [name]  [address]  [place], [place] [address]  [email or web address] COVID-19 is a new challenge, but [facility] is dedicated to providing you and your loved ones with the best possible cancer care and support in the safest way possible during this time. We made two videos about the ways we are working to keep you safe, such as offering the option to visit your care team over the phone or through a video, as well as support services offered for our patients and their caregivers. If you have any questions about your cancer care, please call your care team.  ??  Video #1: Keeping [name] patients safe during the COVID-19 crisis  http://go.eabjmlille.com  ??  Video #2: Support for cancer patients and their caregivers during the COVID-19 pandemic  http://go.SecureGap.uy               Follow Up instructions and Outpatient Referrals     Ambulatory referral to Home Health      Is this a  or [facility] referral?: No    Physician to follow patient's care: PCP    Disciplines requested: Nursing    Nursing requested: Other: (please enter in comments) Comment - Asmtha/COPD management and education.Medicine management & education    Call MD for:  difficulty breathing, headache or visual disturbances      Call MD for:  extreme fatigue      Call MD for:  hives      Call MD for:  persistent dizziness or light-headedness      Call MD for:  persistent nausea or vomiting      Call MD for:  redness, tenderness, or signs of infection (pain, swelling, redness, odor or green/yellow discharge around incision site)      Call MD for:  severe uncontrolled pain  Call MD for: Temperature > 38.5 Celsius ( > 101.3 Fahrenheit)      Discharge instructions            Appointments which have been scheduled for you    Feb 20, 2019  2:30 PM  (Arrive by 2:00 PM)  LAB ONLY [place] with ADULT ONC LAB  [facility] ADULT ONCOLOGY LAB DRAW STATION [facility] REGION) [address]  [place] [address] [phone]      Feb 20, 2019  3:30 PM  (Arrive by 3:00 PM)  RETURN ACTIVE [place] with Avie Arenas, MD  [facility] HEMATOLOGY ONCOLOGY 2ND FLR CANCER HOSP [facility] REGION) [address]  [place] [address]  [phone]      Feb 24, 2019 12:00 PM  (Arrive by 11:30 AM)  LAB ONLY [place] with ADULT ONC LAB  [facility] ADULT ONCOLOGY LAB DRAW STATION [facility] REGION) [address]  [place] [address]  [phone]      Feb 24, 2019  1:00 PM  (Arrive by 12:30 PM)  RETURN ACTIVE [place] with Malva Cogan, CPP  [place] HEMATOLOGY ONCOLOGY 2ND FLR CANCER HOSP [facility] REGION) [address]  [place] [address]  [phone]      Mar 06, 2019 10:30 AM  (Arrive by 10:00 AM)  NURSE LAB DRAW with ADULT ONC LAB  [facility] ADULT ONCOLOGY LAB DRAW STATION [facility] REGION) [address]  [place] [address]  [phone]      Mar 06, 2019 11:30 AM  (Arrive by 11:00 AM)  BONE MARROW BIOPSY with Vernie Murders, AGNP  [place] ONCOLOGY INFUSION [facility] REGION) [address]  [place] [address]  [phone]      Mar 13, 2019 10:00 AM  (Arrive by 9:30 AM)  LAB ONLY [place] with ADULT ONC LAB  [facility] ADULT ONCOLOGY LAB DRAW STATION [employer] [name] REGION) [address]  [place] [address]  [phone]      Mar 13, 2019 11:00 AM  (Arrive by 10:30 AM)  RETURN ACTIVE [place] with Virgil Benedict, [place]  [place] HEMATOLOGY ONCOLOGY 2ND FLR CANCER HOSP [facility] REGION) [address]  [place] [address]  [phone]           ______________________________________________________________________  Discharge Day Services:  BP 117/56  - Pulse 86  - Temp 36.4 ??C (97.5 ??F) (Oral)  - Resp 24 Comment: when awake, 16 when sleeping - Ht 177 cm (5' 9.69)  - Wt (!) 104.4 kg (230 lb 2.6 oz)  - SpO2 96%  - BMI 33.32 kg/m?? Pt seen on the day of discharge and determined appropriate for discharge.    Condition at Discharge: good    Length of Discharge: I spent greater than 30 mins in the discharge of this patient.    Beryle Beams, PA-C  Physician Assistant  Hematology/Oncology

## 2019-02-17 NOTE — Unmapped (Signed)
PT receiving one unit of pRBC's, then will be discharged to his sister.  Sister is bringing the home oxygen that the patient will need for his return home.  Last dose of SQ azacitidine given on Left lower abdomen.  Daily venetoclax due this evening; patient's own venetoclax bottle returned to patient by Ophelia Charter, pharmacy .  [facility] had brought up the rest of the home meds.  Will review discharge with [name], the patient's sister. CTM.

## 2019-02-18 ENCOUNTER — Other Ambulatory Visit: Payer: Self-pay

## 2019-02-18 ENCOUNTER — Telehealth: Payer: Self-pay

## 2019-02-18 MED ORDER — GENERIC EXTERNAL MEDICATION
Status: DC
Start: ? — End: 2019-02-18

## 2019-02-18 NOTE — Telephone Encounter (Signed)
Copied from [place] [id number]. Topic: General - Inquiry >> Feb 18, 2019 10:55 AM Rutherford Nail, NT wrote: Reason for CRM: Patient's sister calling an states that patient is now living with her. States that she is needing to know where he is ordering his Oxygen from. States that he is almost out and she does not know where to order from. Please advise.

## 2019-02-18 NOTE — Telephone Encounter (Signed)
Patient scheduled for hospital follow up with [facility] 10/15. Labs also to be drawn and followed at this time. No transitional care management needed at this time.

## 2019-02-18 NOTE — Patient Outreach (Signed)
[facility]) Care Management  02/18/2019  Nicholas Reyes 1956-02-11 PM:[id number]   Referral Date:02/25/2018 Referral Source:[facility] ED Census Reason for Referral:6 or more ED visits in the past 6 months Insurance:[employer] Medicare & Medicaid   Outreach Attempt:  Outreach #8.  No answer HIPAA compliant voice message left.    Plan: RN CM will attempt again in the month of November.    [name], RN, MSN [place] Management Care Management Coordinator Direct Line [phone] Cell [phone] Toll Free: [phone]  Fax: [phone]

## 2019-02-18 NOTE — Telephone Encounter (Signed)
They need to contact the doctors at [facility] to schedule home oxygen  [place]

## 2019-02-19 ENCOUNTER — Ambulatory Visit: Payer: Self-pay | Admitting: *Deleted

## 2019-02-19 NOTE — Unmapped (Signed)
[facility]ukemia [facility] Follow-up    Patient Name: Raymond [name]  Patient Age: 63 y.o.  Encounter Date: 02/20/2019    Primary Care Provider:  Donnald Garre, MD    Referring Physician:  Pervis Hocking McLean-Scocuzza, MD  [address]  [facility] [address] at [facility]  [place],  [place] [address]    Reason for visit: F/U visit for AML     Assessment:  Raymond [name] is a 63 yo M with CAD, COPD, diabetes, intellectual disability and newly diagnosed AML with MRC, complex cytogenetics, TP53 mutation, currently day 10 of Azacitidine + Venetoclax presents to clinic for follow up appointment.     AML- Raymond [name] was admitted for dose ramp-up venetoclax given his high-risk of TLS, complications, and his social support structure. He tolerated Venetoclax dose-ramp up and cycle 1 of Azacitidine well without complications. He will continue to require labs and transfusion support 2x/week until he establishes care in [place]. He has an appointment at [facility] on 11/06.  He has a BM biopsy scheduled on 10/29 to assess disease status and has follow up with [facility] on 11/05. Thus, we will plan to continue cycle 2 at [facility] starting on November 9th. He started Posaconazole for anti-fungal ppx so will remain on Venetoclax 100 mg daily.   - I recommended Imodium PRN for his diarrhea which is likely secondary to Venetoclax.     Severe COPD- During cycle 1 of Aza/Ven, he developed COPD flare requiring oxygen and higher level of care. He is currently on Prednisone taper to be completed on 10/23.     Melena/GIB- He was deemed high-risk for interventions and likely due to thrombocytopenia. His platelet goal will be >30 and will remain on Protonix 40 mg PO Bid. If bleeding continues, will add Tranexamic acid. Although he states he has dark stools, his hemoglobin does not suggest that he is losing blood. Will follow this closely and add Tranexamic acid as needed. Plan and Recommendations:  1) Platelet transfusion today- Platelet goal >30  2) Cont. Labs 2x/week  3) BM biopsy 10/29  4) Establish care at [facility] on 11/06  5) Cont. Venetoclax 100 mg daily  6) Cont. PPx Abx- Levaquin, Posaconazole, Valtrex    7) Add Lasix 40 mg daily    History of Present Illness:  Oncology History Overview Note   Diagnosis: AML with MDS-Related Changes    Presentation: Pancytopenia    Final Diagnosis   Date Value Ref Range Status   01/15/2019   Final    (Outside Case #:  [id number], dated 01/15/2019)  Bone marrow,  aspiration and biopsy  -  Hypercellular bone marrow (70%) involved by acute myeloid leukemia (30% blasts by manual aspirate differential) (See Comment)        This electronic signature is attestation that the pathologist personally reviewed the submitted material(s) and the final diagnosis reflects that evaluation.          Cytogenetics: Complex/Monosomal Karyotype with Del(5q), -17    Flow cytometry: CD7+, CD33+, CD34+, CD38+, CD117+, CD123+, HLA-DR+    Variants of Known or Likely Clinical Significance    Gene  Transcript   Predicted Protein   VAF (%)    TP53  c.659A>G  p.Tyr220Cys  52        Acute myeloid leukemia not having achieved remission (CMS-HCC)   01/15/2019 Biopsy    Final Diagnosis   Date Value Ref Range Status   01/15/2019   Final    (  Outside Case #:  [id number], dated 01/15/2019)  Bone marrow,  aspiration and biopsy  -  Hypercellular bone marrow (70%) involved by acute myeloid leukemia (30% blasts by manual aspirate differential) (See Comment)        This electronic signature is attestation that the pathologist personally reviewed the submitted material(s) and the final diagnosis reflects that evaluation.          Cytogenetics: Complex Karyotype with Del(5q), -17     01/23/2019 Initial Diagnosis    Acute myeloid leukemia not having achieved remission (CMS-HCC)     02/11/2019 -  Chemotherapy    OP AML AZACITIDINE + VENETOCLAX azacitidine 75 mg/m2 SQ on days 1-7, venetoclax ramp up week 1; dose dependent, then azacitidine 75 mg/m2 SQ on days 1-7 every 28 days.         Interval History:  Since last seen here, Raymond [name] is complaining of lower extremity swelling. Energy is better. He is currently on oxygen at home. Breathing is better. Cough is better and improving. He is walking around and independent of his ADL's. No N/V. 3-4 bowel movements/day. He thinks his stools are dark.     Otherwise, he denies new constitutional symptoms such as anorexia, weight loss, night sweats or unexplained fevers.  Furthermore, he denies symptoms of marrow failure: unexplained bleeding or bruising, recurrent or unexplained intercurrent infections, dyspnea on exertion, lightheadedness, palpitations or chest pain.  There have been no new or unexplained pains or self-identified masses, swelling or enlarged lymph nodes.    Past Medical, Surgical and Family History were reviewed and pertinent updates were made in the Electronic Medical Record    Review of Systems:  Other than as reported above in the interim history, the balance of a full 12-system review was performed and unremarkable.    ECOG Performance Status: 2    Past Medical History:  Past Medical History:   Diagnosis Date   ??? Angina pectoris (CMS-HCC)    ??? Anxiety    ??? Arthritis     bilateral knees   ??? CAD (coronary artery disease)    ??? Depression    ??? Dyslipidemia    ??? HL (hearing loss)    ??? Hyperlipidemia    ??? Hypertension    ??? Medically noncompliant    ??? Mental retardation    ??? MI (myocardial infarction) (CMS-HCC)    ??? Migraines    ??? Sleep apnea     USES CPAP OCCASIONALLY   ??? Thrombocytopenia (CMS-HCC) 01/20/2019       Medications:  Current Outpatient Medications   Medication Sig Dispense Refill   ??? acetaminophen (TYLENOL) 650 MG CR tablet Take 1 tablet (650 mg total) by mouth every eight (8) hours as needed for pain. 30 tablet 0 ??? albuterol HFA 90 mcg/actuation inhaler Inhale 2 puffs every four (4) hours as needed. 8.5 g 0   ??? amLODIPine (NORVASC) 5 MG tablet Take 1 tablet (5 mg total) by mouth daily. 30 tablet 0   ??? citalopram (CELEXA) 20 MG tablet Take 1 tablet (20 mg total) by mouth daily. 30 tablet 0   ??? diclofenac sodium (VOLTAREN) 1 % gel Apply 2 g topically.     ??? dicyclomine (BENTYL) 10 mg capsule Take 10 mg by mouth Two (2) times a day.      ??? docusate sodium (COLACE) 100 MG capsule Take 1 capsule (100 mg total) by mouth daily. 30 capsule 0   ??? FLECTOR 1.3 % PT12 Place 1 patch on the skin  daily.  0   ??? furosemide (LASIX) 40 MG tablet TAKE ONE TABLET BY MOUTH EVERY DAY 30 tablet 2   ??? isosorbide mononitrate (IMDUR) 30 MG 24 hr tablet Take 1 tablet (30 mg total) by mouth daily. 30 tablet 0   ??? levoFLOXacin (LEVAQUIN) 500 MG tablet Take 1 tablet (500 mg total) by mouth daily. 30 tablet 6   ??? metoprolol succinate (TOPROL-XL) 25 MG 24 hr tablet Take 1 tablet (25 mg total) by mouth daily. 30 tablet 0   ??? nitroglycerin (NITROSTAT) 0.4 MG SL tablet Place 1 tablet (0.4 mg total) under the tongue every five (5) minutes as needed for chest pain. 30 tablet 0   ??? ondansetron (ZOFRAN) 4 MG tablet Take 1 tablet (4 mg total) by mouth every eight (8) hours as needed for nausea for up to 30 doses. 30 tablet 0   ??? pantoprazole (PROTONIX) 40 MG tablet Take 1 tablet (40 mg total) by mouth Two (2) times a day. 60 tablet 0   ??? posaconazole (NOXAFIL) 100 mg TbEC delayed released tablet Take 3 tablets (300 mg) by mouth daily. (Patient not taking: Reported on 02/11/2019) 90 tablet 5   ??? predniSONE (DELTASONE) 20 MG tablet Take 2 tablets (40 mg total) by mouth daily for 1 day, THEN 1 tablet (20 mg total) daily for 5 days, THEN 0.5 tablets (10 mg total) daily for 5 days. 10 tablet 0   ??? senna (SENNA LAX) 8.6 mg tablet Take 2 tablets by mouth nightly. 60 tablet 0   ??? SYMBICORT 160-4.5 mcg/actuation inhaler INHALE TWO PUFFS BY MOUTH TWICE DAILY  11 ??? traMADoL (ULTRAM) 50 mg tablet Take 1 tablet (50 mg total) by mouth every eight (8) hours as needed for pain. 90 tablet 0   ??? valACYclovir (VALTREX) 500 MG tablet Take 1 tablet (500 mg total) by mouth daily. 30 tablet 0   ??? venetoclax (VENCLEXTA) 100 mg tablet Take 1 tablet (100 mg total) by mouth daily with a meal and water. Do not chew, crush, or break tablets. 30 tablet 2     No current facility-administered medications for this visit.        Vital Signs:  BSA: 2.27 meters squared  Vitals:    02/20/19 1525   BP: 121/64   Pulse: 74   Resp: 18   Temp: 36.6 ??C (97.8 ??F)   SpO2: 95%         Physical Exam:  Objective:   Physical Exam: General: Resting in no apparent distress  HEENT:  Pupils are equal, round and reactive to light and accomodation.  There is no scleral icterus and no conjunctival injection.  The oral mucosa does not demonstrate ulceration, erythema, exudate or purpura.    Lymph node exam:  No lymphadenopathy in the occipital, auricular, anterior/posterior cervical regions.  Heart:  Regular rate and rhythm.  Normal S1 and S2 without S3 or S4.  There are no murmurs, gallops or rubs.  Lungs:  Breathing is unlabored and patient is speaking full sentences with ease.  No stridor.  Auscultation of lung fields reveals normal air movement without rales, ronchi or crackles.    Abdomen:  No distention or pain on palpation. There is no palpable hepatomegaly or splenomegaly.  No palpable masses.  Skin:  No rashes, petechiae or purpura.  No areas of skin breakdown.  Musculoskeletal:  There are no grossly-evident joint effusions or deformities.  Range of motion about the shoulder, elbow, hips and knees is grossly normal.  Psychiatric:  Alert and oriented to person, place, time and situation.  Range of affect is appropriate.    Neurologic:   Strength 5/5 in upper and lower extremities. Gait is normal.   Extremities:  Appear well-perfused, there is no clubbing, +1 pitting edema Relevant Laboratory, radiology and pathology results:  Recent Results (from the past 24 hour(s))   Comprehensive Metabolic Panel    Collection Time: 02/20/19  2:31 PM   Result Value Ref Range    Sodium 138 135 - 145 mmol/L    Potassium 4.1 3.5 - 5.0 mmol/L    Chloride 96 (L) 98 - 107 mmol/L    Anion Gap 6 (L) 7 - 15 mmol/L    CO2 36.0 (H) 22.0 - 30.0 mmol/L    BUN 21 7 - 21 mg/dL    Creatinine 1.61 0.96 - 1.30 mg/dL    BUN/Creatinine Ratio 29     EGFR CKD-EPI Non-African American, Male >90 >=60 mL/min/1.77m2    EGFR CKD-EPI African American, Male >90 >=60 mL/min/1.18m2    Glucose 148 70 - 179 mg/dL    Calcium 8.3 (L) 8.5 - 10.2 mg/dL    Albumin 3.0 (L) 3.5 - 5.0 g/dL    Total Protein 6.0 (L) 6.5 - 8.3 g/dL    Total Bilirubin 0.8 0.0 - 1.2 mg/dL    AST 22 19 - 55 U/L    ALT 21 <50 U/L    Alkaline Phosphatase 48 38 - 126 U/L   CBC w/ Differential    Collection Time: 02/20/19  2:31 PM   Result Value Ref Range    WBC 0.5 (L) 4.5 - 11.0 10*9/L    RBC 2.71 (L) 4.50 - 5.90 10*12/L    HGB 8.5 (L) 13.5 - 17.5 g/dL    HCT 04.5 (L) 40.9 - 53.0 %    MCV 94.9 80.0 - 100.0 fL    MCH 31.2 26.0 - 34.0 pg    MCHC 32.9 31.0 - 37.0 g/dL    RDW 81.1 (H) 91.4 - 15.0 %    MPV 10.9 (H) 7.0 - 10.0 fL    Platelet 23 (L) 150 - 440 10*9/L    nRBC 3 <=4 /100 WBCs    Neutrophil Left Shift 1+ (A) Not Present    Macrocytosis Slight (A) Not Present    Anisocytosis Slight (A) Not Present   Manual Differential    Collection Time: 02/20/19  2:31 PM   Result Value Ref Range    Neutrophils % 63 %    Lymphocytes % 29 %    Monocytes % 4 %    Eosinophils % 2 %    Basophils % 0 %    Blasts % 2 (HH) <=0 %    Absolute Neutrophils 0.3 (LL) 2.0 - 7.5 10*9/L    Absolute Lymphocytes 0.1 (L) 1.5 - 5.0 10*9/L    Absolute Monocytes 0.0 (L) 0.2 - 0.8 10*9/L    Absolute Eosinophils 0.0 0.0 - 0.4 10*9/L    Absolute Basophils 0.0 0.0 - 0.1 10*9/L    Smear Review Comments See Comment (A) Undefined    Toxic Granulation Present (A) Not Present Cindra Presume, MD

## 2019-02-20 ENCOUNTER — Ambulatory Visit: Payer: Self-pay

## 2019-02-20 ENCOUNTER — Telehealth: Payer: Self-pay | Admitting: Internal Medicine

## 2019-02-20 ENCOUNTER — Ambulatory Visit: Admit: 2019-02-20 | Discharge: 2019-02-21 | Payer: MEDICARE

## 2019-02-20 ENCOUNTER — Ambulatory Visit
Admit: 2019-02-20 | Discharge: 2019-02-21 | Payer: MEDICARE | Attending: Hematology & Oncology | Primary: Hematology & Oncology

## 2019-02-20 ENCOUNTER — Other Ambulatory Visit: Admit: 2019-02-20 | Discharge: 2019-02-21 | Payer: MEDICARE

## 2019-02-20 DIAGNOSIS — C9200 Acute myeloblastic leukemia, not having achieved remission: Principal | ICD-10-CM

## 2019-02-20 DIAGNOSIS — D649 Anemia, unspecified: Secondary | ICD-10-CM | POA: Diagnosis not present

## 2019-02-20 LAB — CBC W/ AUTO DIFF
HEMATOCRIT: 25.7 % — ABNORMAL LOW (ref 41.0–53.0)
MEAN CORPUSCULAR HEMOGLOBIN CONC: 32.9 g/dL (ref 31.0–37.0)
MEAN CORPUSCULAR HEMOGLOBIN: 31.2 pg (ref 26.0–34.0)
MEAN CORPUSCULAR VOLUME: 94.9 fL (ref 80.0–100.0)
MEAN PLATELET VOLUME: 10.9 fL — ABNORMAL HIGH (ref 7.0–10.0)
PLATELET COUNT: 23 10*9/L — ABNORMAL LOW (ref 150–440)
RED BLOOD CELL COUNT: 2.71 10*12/L — ABNORMAL LOW (ref 4.50–5.90)
RED CELL DISTRIBUTION WIDTH: 17.4 % — ABNORMAL HIGH (ref 12.0–15.0)
WBC ADJUSTED: 0.5 10*9/L — ABNORMAL LOW (ref 4.5–11.0)

## 2019-02-20 LAB — COMPREHENSIVE METABOLIC PANEL
ALBUMIN: 3 g/dL — ABNORMAL LOW (ref 3.5–5.0)
ALKALINE PHOSPHATASE: 48 U/L (ref 38–126)
ALT (SGPT): 21 U/L (ref <50)
ANION GAP: 6 mmol/L — ABNORMAL LOW (ref 7–15)
AST (SGOT): 22 U/L (ref 19–55)
BILIRUBIN TOTAL: 0.8 mg/dL (ref 0.0–1.2)
BUN / CREAT RATIO: 29
CALCIUM: 8.3 mg/dL — ABNORMAL LOW (ref 8.5–10.2)
CHLORIDE: 96 mmol/L — ABNORMAL LOW (ref 98–107)
CO2: 36 mmol/L — ABNORMAL HIGH (ref 22.0–30.0)
EGFR CKD-EPI AA MALE: >90 mL/min/{1.73_m2} (ref >=60)
EGFR CKD-EPI NON-AA MALE: >90 mL/min/{1.73_m2} (ref >=60)
GLUCOSE RANDOM: 148 mg/dL (ref 70–179)
POTASSIUM: 4.1 mmol/L (ref 3.5–5.0)
PROTEIN TOTAL: 6 g/dL — ABNORMAL LOW (ref 6.5–8.3)
SODIUM: 138 mmol/L (ref 135–145)

## 2019-02-20 LAB — MANUAL DIFFERENTIAL
BASOPHILS - ABS (DIFF): 0 10*9/L (ref 0.0–0.1)
BLASTS - REL (DIFF): 2 % (ref <=0)
EOSINOPHILS - REL (DIFF): 2 %
LYMPHOCYTES - ABS (DIFF): 0.1 10*9/L — ABNORMAL LOW (ref 1.5–5.0)
LYMPHOCYTES - REL (DIFF): 29 %
MONOCYTES - ABS (DIFF): 0 10*9/L — ABNORMAL LOW (ref 0.2–0.8)
MONOCYTES - REL (DIFF): 4 %
NEUTROPHILS - ABS (DIFF): 0.3 10*9/L — CL (ref 2.0–7.5)
NEUTROPHILS - REL (DIFF): 63 %

## 2019-02-20 LAB — NEUTROPHILS - ABS (DIFF): Neutrophils:NCnc:Pt:Bld:Qn:: 0.3 — CL

## 2019-02-20 LAB — MEAN CORPUSCULAR HEMOGLOBIN CONC: Lab: 32.9

## 2019-02-20 LAB — ALKALINE PHOSPHATASE: Alkaline phosphatase:CCnc:Pt:Ser/Plas:Qn:: 48

## 2019-02-20 MED ORDER — AMLODIPINE 5 MG TABLET: 5 mg | tablet | Freq: Every day | 3 refills | 30 days | Status: AC

## 2019-02-20 MED ORDER — PANTOPRAZOLE 40 MG TABLET,DELAYED RELEASE: 40 mg | tablet | Freq: Two times a day (BID) | 3 refills | 30 days | Status: AC

## 2019-02-20 MED ORDER — VALACYCLOVIR 500 MG TABLET: 500 mg | tablet | Freq: Every day | 3 refills | 30 days | Status: AC

## 2019-02-20 MED ORDER — LEVOFLOXACIN 500 MG TABLET: 500 mg | tablet | Freq: Every day | 6 refills | 30 days | Status: AC

## 2019-02-20 MED ORDER — LOPERAMIDE 2 MG TABLET: 2 mg | tablet | Freq: Four times a day (QID) | 6 refills | 30 days | Status: AC

## 2019-02-20 MED ORDER — FUROSEMIDE 40 MG TABLET: 40 mg | tablet | Freq: Every day | 2 refills | 30 days | Status: AC

## 2019-02-20 NOTE — Telephone Encounter (Signed)
Hematology and Oncology [facility], Elige Radon, [place]  [address]  CB# [id number] [facility]  [place],  [address]  [phone]  [phone] (Fax)      Fax this note to Dr. Pattricia Boss  Urgent attn   Pt needs help with coordination of home O2  I didn't start him on this I believe [facility] cancer center did and he left the hospital without O2 recently possibly  He keeps calling here for O2 but living with sister and not close to [place]   [place]

## 2019-02-20 NOTE — Telephone Encounter (Signed)
Amy calling from [facility] is calling because the patient is stating the machine for oxygen is not working. Amy is wondering where does the patient get is oxygen supplies from?  Please advise [phone] Leave a message with Triage Nurse

## 2019-02-20 NOTE — Telephone Encounter (Signed)
Called and spoke to patient.  Informed patient that he will need to contact the facility where he initially got his oxygen tank.  Patient said that he initially got his oxygen tank from the [facility].  Patient gave phone to his sister Marlowe Kays (ok to speak to per [facility]).  She said that originally patient went to [facility] in [place] but they are in the process of transferring him to the [facility] since pt is staying in at her home in a camper.  Marlowe Kays said that she has the number to the [facility] and will call there to inform them that the pt's oxygen tank is not working.  Called the Triage Nurse at number given but no answer. Clinic was closed.

## 2019-02-20 NOTE — Unmapped (Signed)
Portable oxygen concentrator SN# [id number]    [employer]    Per pt, unit keeps stopping - needs help figuring out how to get help with unit.    Email sent to [employer] tech support for help. Ms. Remus Loffler states that pt has oxygen at hospital while portable concentrator is charging and they just need to be able to get him home where he has a home supply. Emotional support provided.     NN will continue to f/u.

## 2019-02-20 NOTE — Unmapped (Signed)
Contacted office of Dr. Pasty Spillers McLean-Scocuzza to attempt to determine origin of [name]' home oxygen. Dr. McLean-Scocuzza's office will investigate and return call.

## 2019-02-20 NOTE — Unmapped (Signed)
Contacted Raymond [name] to clarify O2 provider. It is not [facility] nor [facility].

## 2019-02-20 NOTE — Unmapped (Signed)
Labs collected via #23 butterfly.  To next appt.

## 2019-02-20 NOTE — Unmapped (Addendum)
It was great to see you today.    Please continue taking Venclexta 100 mg daily (1 pill a day). Please continue Posaconazole 300 mg daily.     Please start taking:  Lasix 40 mg daily  Levaquin 500 mg daily  Valtrex 500 mg daily  Protonix 40 mg twice daily    Do not take Amlodipine unless we tell you otherwise.     Please take Imodium 2 mg as needed for diarrhea up to 4x/day.     We will keep your platelets above 30,000. Please let us know if you have any bloody bowel movements.     If you have questions or concerns at night or on the weekend, call the hospital operator at [phone] and ask to speak to the hematology/oncology fellow on call.  ??  If you have questions or concerns on a week day during business hours, you may call the Nurse call line at [phone].    Lab on 02/20/2019   Component Date Value Ref Range Status   ??? Sodium 02/20/2019 138  135 - 145 mmol/L Final   ??? Potassium 02/20/2019 4.1  3.5 - 5.0 mmol/L Final   ??? Chloride 02/20/2019 96* 98 - 107 mmol/L Final   ??? Anion Gap 02/20/2019 6* 7 - 15 mmol/L Final   ??? CO2 02/20/2019 36.0* 22.0 - 30.0 mmol/L Final   ??? BUN 02/20/2019 21  7 - 21 mg/dL Final   ??? Creatinine 02/20/2019 0.72  0.70 - 1.30 mg/dL Final   ??? BUN/Creatinine Ratio 02/20/2019 29   Final   ??? EGFR CKD-EPI Non-African American,* 02/20/2019 >90  >=60 mL/min/1.38m2 Final   ??? EGFR CKD-EPI African American, Male 02/20/2019 >90  >=60 mL/min/1.79m2 Final   ??? Glucose 02/20/2019 148  70 - 179 mg/dL Final   ??? Calcium 29/56/2130 8.3* 8.5 - 10.2 mg/dL Final   ??? Albumin 86/57/8469 3.0* 3.5 - 5.0 g/dL Final   ??? Total Protein 02/20/2019 6.0* 6.5 - 8.3 g/dL Final   ??? Total Bilirubin 02/20/2019 0.8  0.0 - 1.2 mg/dL Final   ??? AST 62/95/2841 22  19 - 55 U/L Final   ??? ALT 02/20/2019 21  <50 U/L Final   ??? Alkaline Phosphatase 02/20/2019 48  38 - 126 U/L Final   ??? WBC 02/20/2019 0.5* 4.5 - 11.0 10*9/L Final   ??? RBC 02/20/2019 2.71* 4.50 - 5.90 10*12/L Final   ??? HGB 02/20/2019 8.5* 13.5 - 17.5 g/dL Final ??? HCT 32/44/0102 25.7* 41.0 - 53.0 % Final   ??? MCV 02/20/2019 94.9  80.0 - 100.0 fL Final   ??? MCH 02/20/2019 31.2  26.0 - 34.0 pg Final   ??? MCHC 02/20/2019 32.9  31.0 - 37.0 g/dL Final   ??? RDW 72/53/6644 17.4* 12.0 - 15.0 % Final   ??? MPV 02/20/2019 10.9* 7.0 - 10.0 fL Final   ??? Platelet 02/20/2019 23* 150 - 440 10*9/L Final   ??? nRBC 02/20/2019 3  <=4 /100 WBCs Final   ??? Neutrophil Left Shift 02/20/2019 1+* Not Present Final   ??? Macrocytosis 02/20/2019 Slight* Not Present Final   ??? Anisocytosis 02/20/2019 Slight* Not Present Final   ??? Neutrophils % 02/20/2019 63  % Final   ??? Lymphocytes % 02/20/2019 29  % Final   ??? Monocytes % 02/20/2019 4  % Final   ??? Eosinophils % 02/20/2019 2  % Final   ??? Basophils % 02/20/2019 0  % Final   ??? Blasts % 02/20/2019 2* <=0 % Final   ???  Absolute Neutrophils 02/20/2019 0.3* 2.0 - 7.5 10*9/L Final   ??? Absolute Lymphocytes 02/20/2019 0.1* 1.5 - 5.0 10*9/L Final   ??? Absolute Monocytes 02/20/2019 0.0* 0.2 - 0.8 10*9/L Final   ??? Absolute Eosinophils 02/20/2019 0.0  0.0 - 0.4 10*9/L Final   ??? Absolute Basophils 02/20/2019 0.0  0.0 - 0.1 10*9/L Final   ??? Smear Review Comments 02/20/2019 See Comment* Undefined Final    Slide reviewed. Blasts Present. Myelocytes present. Agranular neutrophils present.   ??? Toxic Granulation 02/20/2019 Present* Not Present Final

## 2019-02-21 NOTE — Telephone Encounter (Signed)
Info has been sent.

## 2019-02-21 NOTE — Unmapped (Signed)
Hi Raymond [name],    It looks like we sold this device to [employer] in [place], [place].     I see the device keeps alarming for a low battery which is causing the device to shut down. Prior to the alarm, the device will give off an alert 7 minutes to indicate that it needs to be connected to an [facility] or DC power cord in order to keep running and to recharge the installed batteries.     If the patient continues to experience alarms with their device, I would recommend contacting their provider, [employer] at [phone].          Best Regards,       Mervyn Skeeters- Technical Support  [address]  - [place], [place] [address]  P [phone], ext. [phone] F [phone]  [email or web address]        This email message, including any attachment(s), is for the sole use of the intended recipient(s) and may contain confidential information.  Any unauthorized review, use, disclosure or distribution is strictly prohibited.  If you are not the intended recipient, please immediately contact the sender by email

## 2019-02-21 NOTE — Unmapped (Signed)
On Epic chat:     Dr. Vertell Limber: pt came for labs, blood bank is on emergency shortage until after 1900. Would you like him to go to the ER to receive them or come back Saturday? the patient states that he is now living 3.5 hours away and wasn't planning on coming back until Monday. He states that if he comes back Saturday, he does not want to return Monday. Thoughts?    Amy/Patti--to keep you both in the loop--patient is coming Saturday. They are trying to establish care closer to their new home in [place]. He needs appts made for Tuesday and Friday instead of his current appts (Monday/Thursday). They will get updated appt times when they come Saturday.

## 2019-02-21 NOTE — Unmapped (Signed)
Contacted Ms. Lanning to share contact info for [employer] and encourage her to let them know of the issues for possible repair or replacement. Ms. Remus Loffler verbalized understanding and expressed appreciation for the call.

## 2019-02-22 ENCOUNTER — Ambulatory Visit: Admit: 2019-02-22 | Discharge: 2019-02-23 | Payer: MEDICARE

## 2019-02-22 DIAGNOSIS — C9200 Acute myeloblastic leukemia, not having achieved remission: Secondary | ICD-10-CM | POA: Diagnosis not present

## 2019-02-22 DIAGNOSIS — D649 Anemia, unspecified: Secondary | ICD-10-CM | POA: Diagnosis not present

## 2019-02-22 LAB — PLATELET COUNT
Platelets:NCnc:Pt:Bld:Qn:Automated count: 18 — ABNORMAL LOW
Platelets:NCnc:Pt:Bld:Qn:Automated count: 42 — ABNORMAL LOW

## 2019-02-22 NOTE — Unmapped (Signed)
Patient had a 10:30 appointment time. He was called at 12:30 with no response and a voicemail was left. Patient arrived with his sister at 1:53pm. Per Melina Schools, NP patient will receive only 1 unit of platelets and we will draw his post platelet count. Because of time constraints he will not be able to receive a second unit it needed, he will need to reschedule. Platelets infused uneventfully via PIV. PIV removed. AVS given. Patient/family has no questions or concerns.    Patients post platelet count increased >15 points from 18 to 32.

## 2019-02-22 NOTE — Unmapped (Signed)
Hospital Outpatient Visit on 02/22/2019   Component Date Value Ref Range Status   ??? Platelet 02/22/2019 18* 150 - 440 10*9/L Final   ??? Unit Blood Type 02/22/2019 B Pos   Final   ??? ISBT Number 02/22/2019 [id number]   Final   ??? Unit # 02/22/2019 [id number]   Final   ??? Status 02/22/2019 Issued   Final   ??? Product ID 02/22/2019 Platelets   Final   ??? PRODUCT CODE 02/22/2019 [id number]   Final

## 2019-02-22 NOTE — Unmapped (Signed)
Leukemia Clinic Follow-up Visit Note    Patient Name: Raymond [name]  Patient Age: 63 y.o.  Encounter Date: 02/10/2019    Referring Physician: Dr. Rickard Patience    Primary Care Provider:  Donnald Garre, MD    Reason for visit: assessment pre-induction for new AML    Assessment:  Raymond [name] is a 63 year old with newly diagnosed AML with MDS related changes, complex karyotype, and TP53 mutation. PMX of CAD, COPD, DM, and intellectual disability.  He presents to clinic today prior to admission for aza/ven dose ramp up.     Due to Raymond [name] increased risk of TLS and his social support we opted to admit him for venetoclax dose ramp up.  He is planning to move into a trailer at his sister's home near [place], but he has not moved yet.  This is planned for post discharge.  Raymond [name] has previously reported black watery stools, but today states he's only had a little diarrhea.  He had a drop in his hgb, but this is confounded by his disease.  He denies any bleeding today.  His hgb is stable today and reports increased energy since his last transfusion.  He remains thrombocytopenic.      We will proceed with plan to admit tomorrow for D1C1 of aza/ven through venetoclax ramp up. We will plan for him to start posaconazole on day 8.  Would also like weekly f/u through first cycle.  He will need labs 2x/week.  Referral has been sent to local oncology office in [place] to establish care.     Plans/Recommendations:  - admit tomorrow 10/6 for d1/c1 azacitidine 75mg /m2 x 7 days with venetoclax dose ramp up  - start posaconazole on day 8 and reduce venetoclax to 100mg /day.   - continue levaquin and valtrex.   - continue allopurinal  - labs 2x/week once discharged  - f/u recommended weekly during first cycle      Dr. Vertell Limber was available    Arna Medici, AGNP-BC  Leukemia Research Nurse Practitioner  Hematology/Oncology Division  [facility]  02/10/2019    Oncology History Oncology History Overview Note   Diagnosis: AML with MDS-Related Changes    Presentation: Pancytopenia    Final Diagnosis   Date Value Ref Range Status   01/15/2019   Final    (Outside Case #:  [id number], dated 01/15/2019)  Bone marrow,  aspiration and biopsy  -  Hypercellular bone marrow (70%) involved by acute myeloid leukemia (30% blasts by manual aspirate differential) (See Comment)        This electronic signature is attestation that the pathologist personally reviewed the submitted material(s) and the final diagnosis reflects that evaluation.          Cytogenetics: Complex/Monosomal Karyotype with Del(5q), -17    Flow cytometry: CD7+, CD33+, CD34+, CD38+, CD117+, CD123+, HLA-DR+    Variants of Known or Likely Clinical Significance    Gene  Transcript   Predicted Protein   VAF (%)    TP53  c.659A>G  p.Tyr220Cys  52     Cycle 1: 02/11/19  Azacitidine 75mg /m2 x 7 days + venetoclax 100mg  daily (DR'd d/t DDI with posaconazole)     Acute myeloid leukemia not having achieved remission (CMS-HCC)   01/15/2019 Biopsy    Final Diagnosis   Date Value Ref Range Status   01/15/2019   Final    (Outside Case #:  [id number], dated 01/15/2019)  Bone marrow,  aspiration and biopsy  -  Hypercellular  bone marrow (70%) involved by acute myeloid leukemia (30% blasts by manual aspirate differential) (See Comment)        This electronic signature is attestation that the pathologist personally reviewed the submitted material(s) and the final diagnosis reflects that evaluation.          Cytogenetics: Complex Karyotype with Del(5q), -17     01/23/2019 Initial Diagnosis    Acute myeloid leukemia not having achieved remission (CMS-HCC)     02/11/2019 -  Chemotherapy    OP AML AZACITIDINE + VENETOCLAX  azacitidine 75 mg/m2 SQ on days 1-7, venetoclax ramp up week 1; dose dependent, then azacitidine 75 mg/m2 SQ on days 1-7 every 28 days.       Interval History: Since last seen Raymond [name] reports increased energy since his last transfusion.  His appetite is good.  He has not moved yet.  His sister, who accompanies him today states they are planning to finalize this once he is discharged from the hospital.  No fevers/chills, new cough.  Denies any n/v, mild diarrhea.  No cp/palpitations, or sob. Continues to complain of knee pain.     Otherwise, he denies new constitutional symptoms such as anorexia, weight loss, fatigue, night sweats or unexplained fevers.  Furthermore, he denies unexplained bleeding or bruising, recurrent or unexplained intercurrent infections, dyspnea on exertion, lightheadedness, palpitations or chest pain.  There have been no new or unexplained pains or self-identified masses, swelling or enlarged lymph nodes.    ROS: all other systems reviewed were negative.       Past Medical History:   Diagnosis Date   ??? Angina pectoris (CMS-HCC)    ??? Anxiety    ??? Arthritis     bilateral knees   ??? CAD (coronary artery disease)    ??? Depression    ??? Dyslipidemia    ??? HL (hearing loss)    ??? Hyperlipidemia    ??? Hypertension    ??? Medically noncompliant    ??? Mental retardation    ??? MI (myocardial infarction) (CMS-HCC)    ??? Migraines    ??? Sleep apnea     USES CPAP OCCASIONALLY   ??? Thrombocytopenia (CMS-HCC) 01/20/2019      Past Surgical History:   Procedure Laterality Date   ??? APPENDECTOMY     ??? CARDIAC CATHETERIZATION     ??? CHOLECYSTECTOMY     ??? CORONARY STENT PLACEMENT     ??? HEMORRHOID SURGERY     ??? PR CATH PLACE/CORON ANGIO, IMG SUPER/INTERP,W LEFT HEART VENTRICULOGRAPHY N/A 02/25/2016    Procedure: Left Heart Catheterization W Intervention;  Surgeon: Job Founds, MD;  Location: [facility] CATH;  Service: Cardiology   ??? PR KNEE SCOPE,PART SYNOVECT Right 04/21/2015    Procedure: ARTHROSCOPY, KNEE, SURGICAL; SYNOVECTOMY LIMITED (EG, PLICA OR SHELF RESECTION) (SEPARATE PROCEDURE);  Surgeon: Annice Needy, MD;  [facility];  Service: Orthopedics ??? PR KNEE SCOPE,SINGLE MENISECTOMY Right 04/21/2015    Procedure: ARTHROSCOPY, KNEE; W/MENISECTMY/CHONDROPLASTY/SYNOVECTOMY;  Surgeon: Annice Needy, MD;  Location[name];  Service: Orthopedics   ??? PR XCAPSL CTRC RMVL INSJ IO LENS PROSTH W/O ECP Right 03/12/2017    Procedure: CATARACT RIGHT EYE;  Surgeon: Gwynneth Aliment, MD;  Location: [name];  Service: Ophthalmology   ??? PR XCAPSL CTRC RMVL INSJ IO LENS PROSTH W/O ECP Left 05/28/2017    Procedure: CATARACT LEFT EYE;  Surgeon: Gwynneth Aliment, MD;  Location: [name];  Service: Ophthalmology   ??? SKIN  BIOPSY     ??? TONSILLECTOMY        Family History   Problem Relation Age of Onset   ??? Heart attack Father    ??? Melanoma Neg Hx    ??? Basal cell carcinoma Neg Hx    ??? Squamous cell carcinoma Neg Hx       Family Status   Relation Name Status   ??? Mother  Deceased   ??? Father  Deceased   ??? Neg Hx  (Not Specified)     Other notable Family History:  Brother died from MI.  Mother and Father both had Lung Cancer  Sister and Father: Squamous cell skin cancer  Sister: vWD  No known family history of leukemia.    Social History     Occupational History   ??? Not on file   Tobacco Use   ??? Smoking status: Former Smoker     Packs/day: 1.00     Years: 5.00     Pack years: 5.00     Types: Cigarettes     Quit date: 04/11/2014     Years since quitting: 4.8   ??? Smokeless tobacco: Never Used   Substance and Sexual Activity   ??? Alcohol use: No     Alcohol/week: 0.0 standard drinks   ??? Drug use: No   ??? Sexual activity: Not Currently     Partners: Female     No Known Allergies      Current Outpatient Medications   Medication Sig Dispense Refill   ??? acetaminophen (TYLENOL) 650 MG CR tablet Take 1 tablet (650 mg total) by mouth every eight (8) hours as needed for pain. (Patient not taking: Reported on 02/20/2019) 30 tablet 0   ??? dicyclomine (BENTYL) 10 mg capsule Take 10 mg by mouth Two (2) times a day. ??? FLECTOR 1.3 % PT12 Place 1 patch on the skin daily.  0   ??? nitroglycerin (NITROSTAT) 0.4 MG SL tablet Place 1 tablet (0.4 mg total) under the tongue every five (5) minutes as needed for chest pain. (Patient not taking: Reported on 02/20/2019) 30 tablet 0   ??? posaconazole (NOXAFIL) 100 mg TbEC delayed released tablet Take 3 tablets (300 mg) by mouth daily. 90 tablet 5   ??? SYMBICORT 160-4.5 mcg/actuation inhaler INHALE TWO PUFFS BY MOUTH TWICE DAILY  11   ??? traMADoL (ULTRAM) 50 mg tablet Take 1 tablet (50 mg total) by mouth every eight (8) hours as needed for pain. (Patient not taking: Reported on 02/20/2019) 90 tablet 0   ??? albuterol HFA 90 mcg/actuation inhaler Inhale 2 puffs every four (4) hours as needed. (Patient not taking: Reported on 02/20/2019) 8.5 g 0   ??? amLODIPine (NORVASC) 5 MG tablet Take 1 tablet (5 mg total) by mouth daily. 30 tablet 3   ??? citalopram (CELEXA) 20 MG tablet Take 1 tablet (20 mg total) by mouth daily. 30 tablet 0   ??? diclofenac sodium (VOLTAREN) 1 % gel Apply 2 g topically.     ??? docusate sodium (COLACE) 100 MG capsule Take 1 capsule (100 mg total) by mouth daily. (Patient not taking: Reported on 02/20/2019) 30 capsule 0   ??? furosemide (LASIX) 40 MG tablet Take 1 tablet (40 mg total) by mouth daily. 30 tablet 2   ??? isosorbide mononitrate (IMDUR) 30 MG 24 hr tablet Take 1 tablet (30 mg total) by mouth daily. 30 tablet 0   ??? levoFLOXacin (LEVAQUIN) 500 MG tablet Take 1 tablet (500 mg total) by  mouth daily. 30 tablet 6   ??? loperamide (IMODIUM A-D) 2 mg tablet Take 1 tablet (2 mg total) by mouth 4 (four) times a day as needed for diarrhea. 120 tablet 6   ??? metoprolol succinate (TOPROL-XL) 25 MG 24 hr tablet Take 1 tablet (25 mg total) by mouth daily. 30 tablet 0   ??? ondansetron (ZOFRAN) 4 MG tablet Take 1 tablet (4 mg total) by mouth every eight (8) hours as needed for nausea for up to 30 doses. (Patient not taking: Reported on 02/20/2019) 30 tablet 0 ??? pantoprazole (PROTONIX) 40 MG tablet Take 1 tablet (40 mg total) by mouth Two (2) times a day. 60 tablet 3   ??? predniSONE (DELTASONE) 20 MG tablet Take 2 tablets (40 mg total) by mouth daily for 1 day, THEN 1 tablet (20 mg total) daily for 5 days, THEN 0.5 tablets (10 mg total) daily for 5 days. 10 tablet 0   ??? senna (SENNA LAX) 8.6 mg tablet Take 2 tablets by mouth nightly. 60 tablet 0   ??? valACYclovir (VALTREX) 500 MG tablet Take 1 tablet (500 mg total) by mouth daily. 30 tablet 3   ??? venetoclax (VENCLEXTA) 100 mg tablet Take 1 tablet (100 mg total) by mouth daily with a meal and water. Do not chew, crush, or break tablets. (Patient not taking: Reported on 02/20/2019) 30 tablet 2     No current facility-administered medications for this visit.      Facility-Administered Medications Ordered in Other Visits   Medication Dose Route Frequency Provider Last Rate Last Dose   ??? acetaminophen (TYLENOL) 325 MG tablet            ??? diphenhydrAMINE (BENADRYL) injection 25 mg  25 mg Intravenous Once Rudi Coco, AGNP       ??? EPINEPHrine (EPIPEN) injection 0.3 mg  0.3 mg Intramuscular Once Robyn J Tolley, AGNP       ??? famotidine (PF) (PEPCID) injection 20 mg  20 mg Intravenous Once Robyn J Tolley, AGNP       ??? meperidine (DEMEROL) injection 25 mg  25 mg Intravenous Once PRN Rudi Coco, AGNP       ??? methylPREDNISolone sodium succinate (PF) (Solu-MEDROL) injection 125 mg  125 mg Intravenous Once Rudi Coco, AGNP       ??? sodium chloride (NS) 0.9 % infusion  20 mL/hr Intravenous Continuous Rudi Coco, AGNP       ??? sodium chloride 0.9% (NS) bolus 1,000 mL  1,000 mL Intravenous Once Rudi Coco, AGNP         Vital Signs:  Vitals:    02/10/19 1426   BP: 128/59   Pulse: 82   Resp: 16   Temp: 36.7 ??C (98.1 ??F)   SpO2: 94%     Physical Exam:   General: Resting in no apparent distress  HEENT:  Pupils are equal, round and reactive to light and accomodation.  There is no scleral icterus and no conjunctival injection. Heart:  Regular rate and rhythm.  Normal s1/s2.  There are no murmurs, gallops or rubs.  No edema noted  Lungs:  Breathing is unlabored and patient is speaking full sentences with ease. Auscultation of lung fields reveals normal air movement without rales, ronchi or crackles.    Abdomen:  No distention or pain on palpation.  No palpable masses.  Bowel sounds are present  Skin:  No rashes, petechiae or purpura.  Grossly intact.   Musculoskeletal: Range of motion about  the shoulder, elbow, hips and knees is grossly normal.    Psychiatric: flat affect.    Neurologic:  alert and oriented x 4. Gait is normal.     ECOG Performance Status: 2    Lab Results:  Lab on 02/10/2019   Component Date Value Ref Range Status   ??? ABO Grouping 02/10/2019 A POS   Final   ??? Antibody Screen 02/10/2019 NEG   Final   ??? Sodium 02/10/2019 140  135 - 145 mmol/L Final   ??? Potassium 02/10/2019 3.7  3.5 - 5.0 mmol/L Final   ??? Chloride 02/10/2019 97* 98 - 107 mmol/L Final   ??? Anion Gap 02/10/2019 8  7 - 15 mmol/L Final   ??? CO2 02/10/2019 35.0* 22.0 - 30.0 mmol/L Final   ??? BUN 02/10/2019 13  7 - 21 mg/dL Final   ??? Creatinine 02/10/2019 0.58* 0.70 - 1.30 mg/dL Final   ??? BUN/Creatinine Ratio 02/10/2019 22   Final   ??? EGFR CKD-EPI Non-African American,* 02/10/2019 >90  >=60 mL/min/1.65m2 Final   ??? EGFR CKD-EPI African American, Male 02/10/2019 >90  >=60 mL/min/1.5m2 Final   ??? Glucose 02/10/2019 85  70 - 179 mg/dL Final   ??? Calcium 29/56/2130 8.4* 8.5 - 10.2 mg/dL Final   ??? Albumin 86/57/8469 3.6  3.5 - 5.0 g/dL Final   ??? Total Protein 02/10/2019 6.6  6.5 - 8.3 g/dL Final   ??? Total Bilirubin 02/10/2019 1.0  0.0 - 1.2 mg/dL Final   ??? AST 62/95/2841 28  19 - 55 U/L Final   ??? ALT 02/10/2019 13  <50 U/L Final   ??? Alkaline Phosphatase 02/10/2019 65  38 - 126 U/L Final   ??? Magnesium 02/10/2019 1.7  1.6 - 2.2 mg/dL Final   ??? Phosphorus 02/10/2019 3.7  2.9 - 4.7 mg/dL Final   ??? LDH 02/10/2019 1,816* 338 - 610 U/L Final ??? Uric Acid 02/10/2019 3.7* 4.0 - 9.0 mg/dL Final   ??? WBC 32/44/0102 3.0* 4.5 - 11.0 10*9/L Final   ??? RBC 02/10/2019 2.87* 4.50 - 5.90 10*12/L Final   ??? HGB 02/10/2019 9.2* 13.5 - 17.5 g/dL Final   ??? HCT 72/53/6644 27.9* 41.0 - 53.0 % Final   ??? MCV 02/10/2019 97.0  80.0 - 100.0 fL Final   ??? MCH 02/10/2019 32.1  26.0 - 34.0 pg Final   ??? MCHC 02/10/2019 33.1  31.0 - 37.0 g/dL Final   ??? RDW 03/47/4259 20.3* 12.0 - 15.0 % Final   ??? MPV 02/10/2019 11.5* 7.0 - 10.0 fL Final   ??? Platelet 02/10/2019 22* 150 - 440 10*9/L Final   ??? Neutrophils % 02/10/2019 32.5  % Final   ??? Lymphocytes % 02/10/2019 48.8  % Final   ??? Monocytes % 02/10/2019 6.1  % Final   ??? Eosinophils % 02/10/2019 1.0  % Final   ??? Basophils % 02/10/2019 4.7  % Final   ??? Neutrophil Left Shift 02/10/2019 3+* Not Present Final   ??? Absolute Neutrophils 02/10/2019 1.0* 2.0 - 7.5 10*9/L Final   ??? Absolute Lymphocytes 02/10/2019 1.5  1.5 - 5.0 10*9/L Final   ??? Absolute Monocytes 02/10/2019 0.2  0.2 - 0.8 10*9/L Final   ??? Absolute Eosinophils 02/10/2019 0.0  0.0 - 0.4 10*9/L Final   ??? Absolute Basophils 02/10/2019 0.1  0.0 - 0.1 10*9/L Final   ??? Large Unstained Cells 02/10/2019 12* 0 - 4 % Final    Blasts Present.   ??? Macrocytosis 02/10/2019 Moderate* Not Present Final   ???  Anisocytosis 02/10/2019 Moderate* Not Present Final   ??? Smear Review Comments 02/10/2019 See Comment* Undefined Final    Slide reviewed.   Blast cells present.  Agranular neutrophils present.

## 2019-02-24 DIAGNOSIS — H9190 Unspecified hearing loss, unspecified ear: Secondary | ICD-10-CM | POA: Diagnosis not present

## 2019-02-24 DIAGNOSIS — E119 Type 2 diabetes mellitus without complications: Secondary | ICD-10-CM | POA: Diagnosis not present

## 2019-02-24 DIAGNOSIS — Z743 Need for continuous supervision: Secondary | ICD-10-CM | POA: Diagnosis not present

## 2019-02-24 DIAGNOSIS — R0602 Shortness of breath: Secondary | ICD-10-CM | POA: Diagnosis not present

## 2019-02-24 DIAGNOSIS — R531 Weakness: Secondary | ICD-10-CM | POA: Diagnosis not present

## 2019-02-24 DIAGNOSIS — Z856 Personal history of leukemia: Secondary | ICD-10-CM | POA: Diagnosis not present

## 2019-02-24 DIAGNOSIS — D696 Thrombocytopenia, unspecified: Secondary | ICD-10-CM | POA: Diagnosis not present

## 2019-02-24 DIAGNOSIS — C9200 Acute myeloblastic leukemia, not having achieved remission: Secondary | ICD-10-CM | POA: Diagnosis not present

## 2019-02-24 DIAGNOSIS — D61818 Other pancytopenia: Secondary | ICD-10-CM | POA: Diagnosis not present

## 2019-02-24 DIAGNOSIS — R197 Diarrhea, unspecified: Secondary | ICD-10-CM | POA: Diagnosis not present

## 2019-02-24 DIAGNOSIS — J441 Chronic obstructive pulmonary disease with (acute) exacerbation: Secondary | ICD-10-CM | POA: Diagnosis not present

## 2019-02-24 DIAGNOSIS — I252 Old myocardial infarction: Secondary | ICD-10-CM | POA: Diagnosis not present

## 2019-02-24 DIAGNOSIS — I2510 Atherosclerotic heart disease of native coronary artery without angina pectoris: Secondary | ICD-10-CM | POA: Diagnosis not present

## 2019-02-24 NOTE — Unmapped (Signed)
I called [name] sister [name] at the scheduled appointment time. My focus today would have been to review medications, adherence, and tolerability. However she informed me that they are in the ER (in [place] I believe). He is having severe diarrhea, with stool running down his leg. She is afraid of him dehydrating. The stool is very dark, and there had been a previous concern about GIB, and he is feeling weak. Denies fever. I did confirm that he is taking venetoclax 100 mg daily along with posaconazole 300 mg daily. I did not otherwise do a full med review, but she supported no changes from previous appointment.     Our team will follow-up on outcome of ER visit and if appointments need to be rescheduled.       Manfred Arch, PharmD, BCOP, CPP  Pager: [phone]

## 2019-02-24 NOTE — Unmapped (Signed)
Hem/Onc Phone Triage Note    Caller: Dr Lolita Cram at [facility] ED    Reason for Call:   Boluwatife Flight is a 63 y.o. with a PMHx of CAD, COPD (on home O2), diabetes, intellectual disability, and most notably newly diagnosed AML with MRC, complex cytogenetics, TP53 mutation, currently day 14 of Aza/Venetoclax who presented to [facility] ED with diarrhea.  Per discussion with ED providers pt reported being constipated for 3 days and then took a laxative provided by his brother earlier today and subsequently had severe diarrhea.  The pt's sister reported that his stool was very dark in color.  Only other symptoms were generalized weakness and mild abdominal discomfort.    VS: Temp 37.2, HR 84, BP 132/61, RR 15, O2 94% (on 2L).    Labs:  WBC 400, Hgb 6.6, Plts 16K.  INR 1.25, Fibrinogen 453, D-dimer 1050  Chemistries and LFTs were normal (K slightly low at 3.1)    CXR: Concern for basilar infiltrate.  CT A/P: Reportedly normal and no infiltrate seem in the lower lung fields.    Was initially started on Zosyn due to concern for PNA on CXR, but was then discontinued after CT showed no infiltrate.    Was ordered for 1U PRBCs and 1U of Plts, although appears he did not get the transfusions prior to transport.    He was not tested for COVID.    TRANSFER/ADMISSION??VITAL PARAMATERS:  HR >60 and <120 YES??(HR 84)  SBP >90 and <180 YES??(132/61)  Respirations >8 and <25 YES??(RR 15)  O2 sat >90% on < or = to 40% or stable home O2 requirement YES??(O2 94% on 2L [place])  ??  SHOULD NOT HAVE ANY OF THE FOLLOWING (MUST ALL BE NO ANSWERS):  Concern for neurologic complications related to cancer or treatments:??NO  Acute AMS:??NO  Positive troponin: Not obtained  Concern for leukostasis: NO  Severe sepsis/hemodynamic instability: NO  Differential includes sepsis or leukostasis: NO (WBC is >50K, but no reported symptoms of leukostasis)  Supportive nursing interventions at least every 2 hours: NO Drips, titratable meds or meds requiring frequent monitoring: NO  Vitals/Assessments/Labs more than every 4 hours: NO  Undiagnosed malignancy: NO  Unclear which primary service would best serve the patient: NO    Assessment/Plan:   Pt is a 63 y.o. with a notable PMHx of newly diagnosed AML with MRC, complex cytogenetics, TP53 mutation, currently day 14 of Aza/Venetoclax who presented to [facility] ED with diarrhea.  Based on my conversation with the providers at the [facility] ED it sounds like the severe diarrhea was precipitated by laxative use; however, there has also been concern for a GI bleed in the past and there was report of dark stool and his Hgb was 6.6, so GI bleed remains on the differential.  Additionally, he has been on Levaquin prophylaxis, raising concern for C Diff (was not tested at the OSH).    Recommendations/Plan:  - Obtain T&S and CBC w/diff (pt was not transfused at the OSH prior to transport, so will likely need PRBC and Plt transfusion)  - Obtain CMP, Phosphate, Uric Acid, LDH  - Obtain DIC panel  - Check C Diff  - Check COVID test (out of caution in setting of diarrhea)  - If febrile please obtain BCx, start cefepime, and add Vanc if concern for line and/or skin/soft tissue infection.  - Will defer decision regarding continued Venetoclax to primary team.    Please page the malignant hematology fellow if questions about care  occur.    Fellow Taking Call:  Doree Barthel  February 24, 2019 4:56 PM

## 2019-02-25 ENCOUNTER — Ambulatory Visit: Admit: 2019-02-25 | Discharge: 2019-02-26 | Payer: MEDICARE

## 2019-02-25 DIAGNOSIS — D696 Thrombocytopenia, unspecified: Secondary | ICD-10-CM | POA: Diagnosis not present

## 2019-02-25 DIAGNOSIS — R197 Diarrhea, unspecified: Secondary | ICD-10-CM | POA: Diagnosis not present

## 2019-02-25 DIAGNOSIS — C9200 Acute myeloblastic leukemia, not having achieved remission: Secondary | ICD-10-CM | POA: Diagnosis not present

## 2019-02-25 DIAGNOSIS — I252 Old myocardial infarction: Secondary | ICD-10-CM | POA: Diagnosis not present

## 2019-02-25 DIAGNOSIS — H9190 Unspecified hearing loss, unspecified ear: Secondary | ICD-10-CM | POA: Diagnosis not present

## 2019-02-25 DIAGNOSIS — J441 Chronic obstructive pulmonary disease with (acute) exacerbation: Secondary | ICD-10-CM | POA: Diagnosis not present

## 2019-02-25 DIAGNOSIS — I10 Essential (primary) hypertension: Secondary | ICD-10-CM | POA: Diagnosis not present

## 2019-02-25 DIAGNOSIS — E119 Type 2 diabetes mellitus without complications: Secondary | ICD-10-CM | POA: Diagnosis not present

## 2019-02-25 DIAGNOSIS — I2510 Atherosclerotic heart disease of native coronary artery without angina pectoris: Secondary | ICD-10-CM | POA: Diagnosis not present

## 2019-02-25 DIAGNOSIS — D61818 Other pancytopenia: Secondary | ICD-10-CM | POA: Diagnosis not present

## 2019-02-25 LAB — PROTIME: Coagulation tissue factor induced:Time:Pt:PPP:Qn:Coag: 15.6 — ABNORMAL HIGH

## 2019-02-25 LAB — CBC W/ AUTO DIFF
BASOPHILS ABSOLUTE COUNT: 0 10*9/L (ref 0.0–0.1)
BASOPHILS RELATIVE PERCENT: 4.3 %
EOSINOPHILS ABSOLUTE COUNT: 0 10*9/L (ref 0.0–0.4)
EOSINOPHILS RELATIVE PERCENT: 1.3 %
HEMATOCRIT: 22.3 % — ABNORMAL LOW (ref 41.0–53.0)
HEMOGLOBIN: 6 g/dL — ABNORMAL LOW (ref 13.5–17.5)
HEMOGLOBIN: 7.4 g/dL — ABNORMAL LOW (ref 13.5–17.5)
LARGE UNSTAINED CELLS: 7 % — ABNORMAL HIGH (ref 0–4)
LYMPHOCYTES RELATIVE PERCENT: 44.2 %
MEAN CORPUSCULAR HEMOGLOBIN CONC: 32.7 g/dL (ref 31.0–37.0)
MEAN CORPUSCULAR HEMOGLOBIN CONC: 33.3 g/dL (ref 31.0–37.0)
MEAN CORPUSCULAR HEMOGLOBIN: 30.9 pg (ref 26.0–34.0)
MEAN CORPUSCULAR HEMOGLOBIN: 31.3 pg (ref 26.0–34.0)
MEAN CORPUSCULAR VOLUME: 94.7 fL (ref 80.0–100.0)
MEAN CORPUSCULAR VOLUME: 93.9 fL (ref 80.0–100.0)
MEAN PLATELET VOLUME: 10.9 fL — ABNORMAL HIGH (ref 7.0–10.0)
MEAN PLATELET VOLUME: 9.5 fL (ref 7.0–10.0)
MONOCYTES ABSOLUTE COUNT: 0 10*9/L — ABNORMAL LOW (ref 0.2–0.8)
MONOCYTES RELATIVE PERCENT: 7.1 %
NEUTROPHILS ABSOLUTE COUNT: 0.2 10*9/L — CL (ref 2.0–7.5)
NUCLEATED RED BLOOD CELLS: 13 /100{WBCs} — ABNORMAL HIGH (ref <=4)
PLATELET COUNT: 36 10*9/L — ABNORMAL LOW (ref 150–440)
PLATELET COUNT: 44 10*9/L — ABNORMAL LOW (ref 150–440)
RED BLOOD CELL COUNT: 1.95 10*12/L — ABNORMAL LOW (ref 4.50–5.90)
RED BLOOD CELL COUNT: 2.38 10*12/L — ABNORMAL LOW (ref 4.50–5.90)
RED CELL DISTRIBUTION WIDTH: 17.5 % — ABNORMAL HIGH (ref 12.0–15.0)
RED CELL DISTRIBUTION WIDTH: 17.1 % — ABNORMAL HIGH (ref 12.0–15.0)
WBC ADJUSTED: 0.4 10*9/L — CL (ref 4.5–11.0)
WBC ADJUSTED: 0.4 10*9/L — CL (ref 4.5–11.0)

## 2019-02-25 LAB — PHOSPHORUS: Phosphate:MCnc:Pt:Ser/Plas:Qn:: 3.3

## 2019-02-25 LAB — LYMPHOCYTES RELATIVE PERCENT: Lymphocytes/100 leukocytes:NFr:Pt:Bld:Qn:Automated count: 44.2

## 2019-02-25 LAB — COMPREHENSIVE METABOLIC PANEL
ALBUMIN: 2.7 g/dL — ABNORMAL LOW (ref 3.5–5.0)
ALKALINE PHOSPHATASE: 72 U/L (ref 38–126)
ALT (SGPT): 26 U/L (ref <50)
ANION GAP: 1 mmol/L — ABNORMAL LOW (ref 7–15)
AST (SGOT): 28 U/L (ref 19–55)
BILIRUBIN TOTAL: 1.5 mg/dL — ABNORMAL HIGH (ref 0.0–1.2)
BLOOD UREA NITROGEN: 17 mg/dL (ref 7–21)
BUN / CREAT RATIO: 23
CALCIUM: 7.7 mg/dL — ABNORMAL LOW (ref 8.5–10.2)
CHLORIDE: 98 mmol/L (ref 98–107)
CO2: 36 mmol/L — ABNORMAL HIGH (ref 22.0–30.0)
CREATININE: 0.73 mg/dL (ref 0.70–1.30)
EGFR CKD-EPI AA MALE: >90 mL/min/{1.73_m2} (ref >=60)
EGFR CKD-EPI NON-AA MALE: >90 mL/min/{1.73_m2} (ref >=60)
POTASSIUM: 3.4 mmol/L — ABNORMAL LOW (ref 3.5–5.0)
PROTEIN TOTAL: 5.3 g/dL — ABNORMAL LOW (ref 6.5–8.3)
SODIUM: 135 mmol/L (ref 135–145)

## 2019-02-25 LAB — LACTATE DEHYDROGENASE: Lactate dehydrogenase:CCnc:Pt:Ser/Plas:Qn:Reaction: pyruvate to lactate: 974 — ABNORMAL HIGH

## 2019-02-25 LAB — MANUAL DIFFERENTIAL
BASOPHILS - ABS (DIFF): 0 10*9/L (ref 0.0–0.1)
BASOPHILS - REL (DIFF): 0 %
BLASTS - REL (DIFF): 4 % (ref <=0)
LYMPHOCYTES - ABS (DIFF): 0.2 10*9/L — ABNORMAL LOW (ref 1.5–5.0)
LYMPHOCYTES - REL (DIFF): 54 %
MONOCYTES - ABS (DIFF): 0 10*9/L — ABNORMAL LOW (ref 0.2–0.8)
MONOCYTES - REL (DIFF): 4 %
NEUTROPHILS - ABS (DIFF): 0.2 10*9/L — CL (ref 2.0–7.5)

## 2019-02-25 LAB — BASOPHILS - REL (DIFF): Basophils/100 leukocytes:NFr:Pt:Bld:Qn:Manual count: 0

## 2019-02-25 LAB — PROTIME-INR: PROTIME: 15.6 s — ABNORMAL HIGH (ref 10.2–13.1)

## 2019-02-25 LAB — URIC ACID: Urate:MCnc:Pt:Ser/Plas:Qn:: 4.1

## 2019-02-25 LAB — HEMATOCRIT: Hematocrit:VFr:Pt:Bld:Qn:: 25.7 — ABNORMAL LOW

## 2019-02-25 LAB — HEMOGLOBIN
Hemoglobin:MCnc:Pt:Bld:Qn:: 8 — ABNORMAL LOW
Hemoglobin:MCnc:Pt:Bld:Qn:: 8 — ABNORMAL LOW

## 2019-02-25 LAB — SMEAR REVIEW

## 2019-02-25 LAB — HEMOGLOBIN AND HEMATOCRIT, BLOOD: HEMOGLOBIN: 8.4 g/dL — ABNORMAL LOW (ref 13.5–17.5)

## 2019-02-25 LAB — CO2: Carbon dioxide:SCnc:Pt:Ser/Plas:Qn:: 36 — ABNORMAL HIGH

## 2019-02-25 LAB — MEAN CORPUSCULAR HEMOGLOBIN CONC: Lab: 32.7

## 2019-02-25 MED ORDER — ALBUTEROL SULFATE (2.5 MG/3ML) 0.083% IN NEBU
2.50 | INHALATION_SOLUTION | RESPIRATORY_TRACT | Status: DC
Start: ? — End: 2019-02-25

## 2019-02-25 MED ORDER — SODIUM CHLORIDE 0.9 % IV SOLN
INTRAVENOUS | Status: DC
Start: ? — End: 2019-02-25

## 2019-02-25 MED ORDER — ISOSORBIDE MONONITRATE ER 30 MG PO TB24
30.00 | ORAL_TABLET | ORAL | Status: DC
Start: 2019-02-27 — End: 2019-02-25

## 2019-02-25 MED ORDER — DICLOFENAC SODIUM 1 % TD GEL
2.00 | TRANSDERMAL | Status: DC
Start: ? — End: 2019-02-25

## 2019-02-25 MED ORDER — LEVOFLOXACIN 500 MG PO TABS
500.00 | ORAL_TABLET | ORAL | Status: DC
Start: 2019-02-27 — End: 2019-02-25

## 2019-02-25 MED ORDER — AMLODIPINE BESYLATE 5 MG PO TABS
5.00 | ORAL_TABLET | ORAL | Status: DC
Start: 2019-02-27 — End: 2019-02-25

## 2019-02-25 MED ORDER — CITALOPRAM HYDROBROMIDE 20 MG PO TABS
20.00 | ORAL_TABLET | ORAL | Status: DC
Start: 2019-02-27 — End: 2019-02-25

## 2019-02-25 MED ORDER — PANTOPRAZOLE SODIUM 40 MG PO TBEC
40.00 | DELAYED_RELEASE_TABLET | ORAL | Status: DC
Start: 2019-02-26 — End: 2019-02-25

## 2019-02-25 MED ORDER — VALACYCLOVIR HCL 500 MG PO TABS
500.00 | ORAL_TABLET | ORAL | Status: DC
Start: 2019-02-27 — End: 2019-02-25

## 2019-02-25 MED ORDER — POSACONAZOLE 100 MG PO TBEC
300.00 | DELAYED_RELEASE_TABLET | ORAL | Status: DC
Start: 2019-02-27 — End: 2019-02-25

## 2019-02-25 MED ORDER — TRANEXAMIC ACID 650 MG PO TABS
1300.00 | ORAL_TABLET | ORAL | Status: DC
Start: 2019-02-26 — End: 2019-02-25

## 2019-02-25 MED ORDER — PREDNISONE 10 MG PO TABS
10.00 | ORAL_TABLET | ORAL | Status: DC
Start: 2019-02-27 — End: 2019-02-25

## 2019-02-25 MED ORDER — GENERIC EXTERNAL MEDICATION
Status: DC
Start: ? — End: 2019-02-25

## 2019-02-25 MED ORDER — VENETOCLAX 100 MG PO TABS
100.00 | ORAL_TABLET | ORAL | Status: DC
Start: 2019-02-26 — End: 2019-02-25

## 2019-02-25 MED ORDER — TRAMADOL HCL 50 MG PO TABS
50.00 | ORAL_TABLET | ORAL | Status: DC
Start: ? — End: 2019-02-25

## 2019-02-25 MED ORDER — ONDANSETRON HCL 8 MG PO TABS
4.00 | ORAL_TABLET | ORAL | Status: DC
Start: ? — End: 2019-02-25

## 2019-02-25 MED ORDER — FLUTICASONE FUROATE-VILANTEROL 100-25 MCG/INH IN AEPB
1.00 | INHALATION_SPRAY | RESPIRATORY_TRACT | Status: DC
Start: 2019-02-27 — End: 2019-02-25

## 2019-02-25 MED ORDER — NITROGLYCERIN 0.4 MG SL SUBL
0.40 | SUBLINGUAL_TABLET | SUBLINGUAL | Status: DC
Start: ? — End: 2019-02-25

## 2019-02-25 MED ORDER — ACETAMINOPHEN 325 MG PO TABS
650.00 | ORAL_TABLET | ORAL | Status: DC
Start: ? — End: 2019-02-25

## 2019-02-25 MED ORDER — METOPROLOL SUCCINATE ER 25 MG PO TB24
25.00 | ORAL_TABLET | ORAL | Status: DC
Start: 2019-02-27 — End: 2019-02-25

## 2019-02-25 MED ORDER — FUROSEMIDE 40 MG PO TABS
40.00 | ORAL_TABLET | ORAL | Status: DC
Start: 2019-02-27 — End: 2019-02-25

## 2019-02-25 MED ORDER — DICYCLOMINE HCL 10 MG PO CAPS
10.00 | ORAL_CAPSULE | ORAL | Status: DC
Start: 2019-02-26 — End: 2019-02-25

## 2019-02-25 MED ORDER — TRANEXAMIC ACID 650 MG TABLET: 1300 mg | tablet | Freq: Three times a day (TID) | 0 refills | 30 days

## 2019-02-25 NOTE — Unmapped (Signed)
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 y/o male with PMHx as noted below that presents to [facility] with Diarrhea.    Anemia, C/f GI bleed with hx of melena: Hemoglobin 6.7 at the outside hospital; given his history of melena for the past few weeks, GI bleed is definitely in her differential.  Looking at his trend, consider patient had a normal hemoglobin back in 2018.  Upon diagnosis of his AML, has had a hemoglobin fluctuating between 6 and 9.  Unknown whether his hemoglobin drop is acute and correlates with an ongoing GI bleed or if it is more related to his AML.  -Trend H&H every 6 hours  -Normal transfusion thresholds  -IV twice daily PPI; may discontinue if hemoglobin stays stable  -N.p.o. at midnight for possible scope  -GI consult in the morning    Diarrhea: Patient describes being constipated since 4 days ago; his brother gave him a pill (unknown which 1) and that he suddenly started having uncontrollable diarrhea.  He describes the blood is liquid and dripping down his legs.  Has not seen any blood in the stool.  He also has diffuse abdominal pain that he says is chronic in nature and has been present for the past few months.  Differential diagnosis includes infectious diarrhea, medication induced diarrhea (in particular posaconazole and venetoclax since these are new for him). Has not had any episodes since this am.  - C diff panel  - hold off abx at this time; received 1x dose of zosyn at OSH  - loperamide once c diff negative; will discontinue d diff if unable to provide sample.     AML, with MRC, complex karyotype, TP53 mutation: Recently discharged on 10/12 for new diagnosis of AML.Marland Kitchen Underwent BMbx which confirmed AML with TP53 mutation. S/p C1D1 on 10/6 with venetoclax. - cont home valtrex  - cont home levaquin  - cont home posaconazole  - Notify Dr. Vertell Limber of admission    Chronic conditions:   - Pain management: cont home tramadol, tylenol  - HTN: cont home amlodipine, imdur,metop, lasix  - CAD: cont home metop  - COPD exacerbation on previous admission: cont pred taper @10mg       FEN/GI/DVT  Diet: NPO  IV fluids:none  Replete Lytes PRN   GI: BID PPI  DVT: contraindicated    Code Status:  Full Code    Dispo:  ___________________________________________________________________    Chief Complaint  No chief complaint on file.      HPI:  Raymond [name] is a 63 y.o. y/o male with PMHx as noted below that presents to [facility] with Diarrhea.    Patient presented to [facility] system for uncontrollable diarrhea.  Patient reports he had been constipated for 3 to 4 days and then his brother-in-law gave him a medication that he thinks might have been a laxative.  Shortly afterwards, he started having uncontrollable diarrhea described as liquid stools that dripped down his legs.  Unknown how many movements he had but his last one was this morning.  He did not see any blood in the stool.  Patient also has diffuse abdominal pain that he has had for the past 3 or 4 months and is described as constant.  Patient does not have any nausea or vomiting.  Patient also denies any fevers, cough, headaches or other infectious symptoms.  However, patient says that he has noticed darker stools over the past few months.  Denies any symptoms of anemia including shortness of breath with exertion or at rest, changes in skin color.  Patient has severe pancytopenia and gets regularly scheduled transfusions.  At the outside hospital ED, patient was found to be pancytopenic.  Abdominal CT performed and was unremarkable.  Outside hospital ED spoke to Dr. Billey Co and decision was made to transfer patient to [facility] for further work-up of his diarrhea. Patient was recently discharged on 10/12 for new diagnosis of AML.  He underwent a bone marrow biopsy which confirmed AML with T p53 mutation.  He was started on venetoclax on 10/6 and is currently on cycle 1 day 14.    Allergies:  Patient has no known allergies.    Medications:   Prior to Admission medications    Medication Sig Start Date End Date Taking? Authorizing Provider   acetaminophen (TYLENOL) 650 MG CR tablet Take 1 tablet (650 mg total) by mouth every eight (8) hours as needed for pain.  Patient not taking: Reported on 02/20/2019 06/17/16   Golda Acre, MD   albuterol HFA 90 mcg/actuation inhaler Inhale 2 puffs every four (4) hours as needed.  Patient not taking: Reported on 02/20/2019 02/17/19   Beryle Beams, PA   amLODIPine (NORVASC) 5 MG tablet Take 1 tablet (5 mg total) by mouth daily. 02/20/19   Avie Arenas, MD   citalopram (CELEXA) 20 MG tablet Take 1 tablet (20 mg total) by mouth daily. 02/17/19   Beryle Beams, PA   diclofenac sodium (VOLTAREN) 1 % gel Apply 2 g topically. 01/11/18 02/11/20  Historical Provider, MD   dicyclomine (BENTYL) 10 mg capsule Take 10 mg by mouth Two (2) times a day.  01/08/19   Historical Provider, MD   docusate sodium (COLACE) 100 MG capsule Take 1 capsule (100 mg total) by mouth daily.  Patient not taking: Reported on 02/20/2019 02/17/19 03/19/19  Beryle Beams, PA   FLECTOR 1.3 % PT12 Place 1 patch on the skin daily. 06/04/17   Historical Provider, MD   furosemide (LASIX) 40 MG tablet Take 1 tablet (40 mg total) by mouth daily. 02/20/19   Avie Arenas, MD   isosorbide mononitrate (IMDUR) 30 MG 24 hr tablet Take 1 tablet (30 mg total) by mouth daily. 02/17/19   Beryle Beams, PA   levoFLOXacin (LEVAQUIN) 500 MG tablet Take 1 tablet (500 mg total) by mouth daily. 02/20/19   Avie Arenas, MD loperamide (IMODIUM A-D) 2 mg tablet Take 1 tablet (2 mg total) by mouth 4 (four) times a day as needed for diarrhea. 02/20/19 03/22/19  Avie Arenas, MD   metoprolol succinate (TOPROL-XL) 25 MG 24 hr tablet Take 1 tablet (25 mg total) by mouth daily. 02/17/19   Beryle Beams, PA   nitroglycerin (NITROSTAT) 0.4 MG SL tablet Place 1 tablet (0.4 mg total) under the tongue every five (5) minutes as needed for chest pain.  Patient not taking: Reported on 02/20/2019 02/10/13   Dyanne Iha, MD   ondansetron (ZOFRAN) 4 MG tablet Take 1 tablet (4 mg total) by mouth every eight (8) hours as needed for nausea for up to 30 doses.  Patient not taking: Reported on 02/20/2019 02/17/19   Beryle Beams, PA   pantoprazole (PROTONIX) 40  MG tablet Take 1 tablet (40 mg total) by mouth Two (2) times a day. 02/20/19   Avie Arenas, MD   posaconazole (NOXAFIL) 100 mg TbEC delayed released tablet Take 3 tablets (300 mg) by mouth daily. 01/30/19   Avie Arenas, MD   predniSONE (DELTASONE) 20 MG tablet Take 2 tablets (40 mg total) by mouth daily for 1 day, THEN 1 tablet (20 mg total) daily for 5 days, THEN 0.5 tablets (10 mg total) daily for 5 days. 02/18/19 03/01/19  Beryle Beams, PA   senna (SENNA LAX) 8.6 mg tablet Take 2 tablets by mouth nightly. 02/17/19   Beryle Beams, PA   SYMBICORT 160-4.5 mcg/actuation inhaler INHALE TWO PUFFS BY MOUTH TWICE DAILY 03/11/15   Historical Provider, MD   traMADoL (ULTRAM) 50 mg tablet Take 1 tablet (50 mg total) by mouth every eight (8) hours as needed for pain.  Patient not taking: Reported on 02/20/2019 01/30/19   Avie Arenas, MD   valACYclovir (VALTREX) 500 MG tablet Take 1 tablet (500 mg total) by mouth daily. 02/20/19   Avie Arenas, MD   venetoclax [facility]) 100 mg tablet Take 1 tablet (100 mg total) by mouth daily with a meal and water. Do not chew, crush, or break tablets. Patient not taking: Reported on 02/20/2019 02/17/19   Beryle Beams, PA       Medical History:  Past Medical History:   Diagnosis Date   ??? Angina pectoris (CMS-HCC)    ??? Anxiety    ??? Arthritis     bilateral knees   ??? CAD (coronary artery disease)    ??? Depression    ??? Dyslipidemia    ??? HL (hearing loss)    ??? Hyperlipidemia    ??? Hypertension    ??? Medically noncompliant    ??? Mental retardation    ??? MI (myocardial infarction) (CMS-HCC)    ??? Migraines    ??? Sleep apnea     USES CPAP OCCASIONALLY   ??? Thrombocytopenia (CMS-HCC) 01/20/2019       Surgical History:  Past Surgical History:   Procedure Laterality Date   ??? APPENDECTOMY     ??? CARDIAC CATHETERIZATION     ??? CHOLECYSTECTOMY     ??? CORONARY STENT PLACEMENT     ??? HEMORRHOID SURGERY     ??? PR CATH PLACE/CORON ANGIO, IMG SUPER/INTERP,W LEFT HEART VENTRICULOGRAPHY N/A 02/25/2016    Procedure: Left Heart Catheterization W Intervention;  Surgeon: Job Founds, MD;  Location[facility];  Service: Cardiology   ??? PR KNEE SCOPE,PART SYNOVECT Right 04/21/2015    Procedure: ARTHROSCOPY, KNEE, SURGICAL; SYNOVECTOMY LIMITED (EG, PLICA OR SHELF RESECTION) (SEPARATE PROCEDURE);  Surgeon: Annice Needy, MD;  [facility];  Service: Orthopedics   ??? PR KNEE SCOPE,SINGLE MENISECTOMY Right 04/21/2015    Procedure: ARTHROSCOPY, KNEE; W/MENISECTMY/CHONDROPLASTY/SYNOVECTOMY;  Surgeon: Annice Needy, MD;  Location: [name] [facility];  Service: Orthopedics   ??? PR XCAPSL CTRC RMVL INSJ IO LENS PROSTH W/O ECP Right 03/12/2017    Procedure: CATARACT RIGHT EYE;  Surgeon: Gwynneth Aliment, MD;  Location: [name] [facility];  Service: Ophthalmology   ??? PR XCAPSL CTRC RMVL INSJ IO LENS PROSTH W/O ECP Left 05/28/2017    Procedure: CATARACT LEFT EYE;  Surgeon: Gwynneth Aliment, MD;  Location: [name] [facility];  Service: Ophthalmology   ??? SKIN BIOPSY     ??? TONSILLECTOMY         Social History: Tobacco  use:   reports that he quit smoking about 4 years ago. His smoking use included cigarettes. He has a 5.00 pack-year smoking history. He has never used smokeless tobacco.  Alcohol use:   reports no history of alcohol use.  Drug use:  reports no history of drug use.  Living situation: the patient lives with their family.    Family History:  The patient's family history includes Heart attack in his father..    Review of Systems:  10 systems reviewed and are negative unless otherwise mentioned in HPI      Physical Exam:  Temp:  [37.6 ??C] 37.6 ??C  Heart Rate:  [81] 81  Resp:  [20] 20  BP: (132)/(61) 132/61  SpO2:  [91 %] 91 %    Gen: WDWN lying in bed in NAD  HENT: No nasal discharge, moist mucus membranes  Eyes: EOMI, no scleral icterus  Respiratory: CTA BL with no increased WOB  Cardio: Normal rate with regular rhythm no m/r/g  Abdomen: Diffusely tender to palpation but no grimacing noted.  Extremities: warm and well perfused, no edema  Neuro: Alert and oriented with no focal neurological deficits  Skin: No rashes or jaundice    Test Results:  Data Review:  All lab results last 24 hours:  No results found for this or any previous visit (from the past 24 hour(s)).    Imaging: Radiology studies were personally reviewed

## 2019-02-25 NOTE — Unmapped (Signed)
Pt transferred from OSH for diarrhea & abdominal pain. Alert/oriented x4. VSS, On O2 @2L / with sat 90-94% maintained. Refused COVID swab, no SOB, no coughing. Abdominal pain voiced. Tramadol po x1 given with relief. Oriented to room and unit routine. NPO after MN instructed for possible scope & GI C/S.   Will CTM.

## 2019-02-25 NOTE — Unmapped (Signed)
Care Management  Initial Transition Planning Assessment    Per H&P:  Raymond [name] is a 63 y.o. y/o male with PMHx as noted below that presents to [facility] with Diarrhea.        Medical Provider(s): TRACY Kristen Loader, MD  Reason for Admission: Admitting Diagnosis:  AML  Past Medical History:   has a past medical history of Angina pectoris (CMS-HCC), Anxiety, Arthritis, CAD (coronary artery disease), Depression, Dyslipidemia, HL (hearing loss), Hyperlipidemia, Hypertension, Medically noncompliant, Mental retardation, MI (myocardial infarction) (CMS-HCC), Migraines, Sleep apnea, and Thrombocytopenia (CMS-HCC) (01/20/2019).  Past Surgical History:   has a past surgical history that includes Cholecystectomy; Appendectomy; Cardiac catheterization; Coronary stent placement; Tonsillectomy; pr knee scope,single menisectomy (Right, 04/21/2015); pr knee scope,part synovect (Right, 04/21/2015); Hemorrhoid surgery; pr cath place/coron angio, img super/interp,w left heart ventriculography (N/A, 02/25/2016); pr xcapsl ctrc rmvl insj io lens prosth w/o ecp (Right, 03/12/2017); pr xcapsl ctrc rmvl insj io lens prosth w/o ecp (Left, 05/28/2017); and Skin biopsy.   Previous admit date: 02/11/2019    Primary Insurance- Payor: HUMANA MEDICARE ADV / Plan: HUMANA GOLD PLUS HMO / Product Type: *No Product type* /   Secondary Insurance ??? Secondary Insurance  MEDICAID [place]  Prescription Coverage ??? Yes  Preferred Pharmacy - [id number] [place] ([place]), [place] - [address]  [facility] [place] - [address]  [facility]  [facility]  [facility] [phone] - [place], [place] - [address] AT [facility] [address]    Transportation home: Private vehicle  Level of function prior to admission: Requires Assistance                General Care Manager assessed the patient by : Telephone conversation with family, Medical record review, Discussion with Clinical Care team(Patient on enteric precautions and being r/o for COVID)  Orientation Level: Oriented X4  Who provides care at home?: Family member  Reason for referral: Discharge Planning    Contact/Decision Maker  Extended Emergency Contact Information  Primary Emergency Contact: Abernathy,Joyce  Address: [address]           [place], [place] [address] [place]  Mobile Phone: [phone]  Relation: Sister  Secondary Emergency Contact: Lanning,Connie  Mobile Phone: [phone]  Relation: Sister    Legal Next of Kin / Guardian / POA / Advance Directives       Advance Directive (Medical Treatment)  Does patient have an advance directive covering medical treatment?: Patient does not have advance directive covering medical treatment.  Reason patient does not have an advance directive covering medical treatment:: Patient does not wish to complete one at this time.    Health Care Decision Maker [HCDM] (Medical & Mental Health Treatment)  Healthcare Decision Maker: Patient does not wish to appoint a Health Care Decision Maker at this time  Information offered on HCDM, Medical & Mental Health advance directives:: Patient declined information.         Patient Information  Lives with: Family members(sister, [name])    Type of Residence: Private residence     [address] [place] [address]   [phone] (cell)             Support Systems/Concerns: Family Members    Responsibilities/Dependents at home?: No    Home Care services in place prior to admission?: No  Equipment Currently Used at Home: oxygen(CPAP)       Currently receiving outpatient dialysis?: No       Financial Information       Need for financial assistance?: No       Social Determinants of Health  Social History     Socioeconomic History   ??? Marital status: Single     Spouse name: Not on file ??? Number of children: Not on file   ??? Years of education: Not on file   ??? Highest education level: Not on file   Occupational History   ??? Not on file   Social Needs   ??? Financial resource strain: Not on file   ??? Food insecurity     Worry: Never true     Inability: Never true   ??? Transportation needs     Medical: Not on file     Non-medical: Not on file   Tobacco Use   ??? Smoking status: Former Smoker     Packs/day: 1.00     Years: 5.00     Pack years: 5.00     Types: Cigarettes     Quit date: 04/11/2014     Years since quitting: 4.8   ??? Smokeless tobacco: Never Used   Substance and Sexual Activity   ??? Alcohol use: No     Alcohol/week: 0.0 standard drinks   ??? Drug use: No   ??? Sexual activity: Not Currently     Partners: Female   Lifestyle   ??? Physical activity     Days per week: Not on file     Minutes per session: Not on file   ??? Stress: Not on file   Relationships   ??? Social Wellsite geologist on phone: Not on file     Gets together: Not on file     Attends religious service: Not on file     Active member of club or organization: Not on file     Attends meetings of clubs or organizations: Not on file     Relationship status: Not on file   Other Topics Concern   ??? Do you use sunscreen? No   ??? Tanning bed use? No   ??? Are you easily burned? No   ??? Excessive sun exposure? No   ??? Blistering sunburns? No   Social History Narrative   ??? Not on file     Housing/Utilities   ??? Within the past 12 months, have you ever stayed: outside, in a car, in a tent, in an overnight shelter, or temporarily in someone else's home (i.e. couch-surfing)?     ??? Are you worried about losing your housing?     ??? Within the past 12 months, have you been unable to get utilities (heat, electricity) when it was really needed?       Literacy   ??? How often do you need to have someone help you when you read instructions, pamphlets, or other written material from your doctor or pharmacy?         Discharge Needs Assessment Concerns to be Addressed: discharge planning    Clinical Risk Factors: Principal Diagnosis: Cancer, Stroke, COPD, Heart Failure, AMI, Pneumonia, Joint Replacment, New Diagnosis, Multiple Diagnoses (Chronic), Readmission < 30 Days, Functional Limitations    Barriers to taking medications: No    Prior overnight hospital stay or ED visit in last 90 days: Yes(10/6 thu 10/12 New AML)    Readmission Within the Last 30  Days: unable to assess         Anticipated Changes Related to Illness: inability to care for self    Equipment Needed After Discharge: (TBD)    Discharge Facility/Level of Care Needs:  N/A    Readmission  Risk of Unplanned Readmission Score:  %  Predictive Model Details           21% (Medium) Factors Contributing to Score   Calculated 02/25/2019 15:06 29% Number of active Rx orders is 45   Cyrus Risk of Unplanned Readmission Model 10% Diagnosis of cancer is present     9% ECG/EKG order is present in last 6 months     9% Latest calcium is low (7.7 mg/dL)     6% Imaging order is present in last 6 months     6% Latest hemoglobin is low (8.4 g/dL)     6% Phosphorous result is present     5% Charlson Comorbidity Index is 5     5% Age is 89     5% Number of hospitalizations in last year is 1     4% Diagnosis of deficiency anemia is present     4% Active corticosteroid Rx order is present     2% Future appointment is scheduled     1% Active ulcer medication Rx order is present     1% Current length of stay is 0.722 days     Readmitted Within the Last 30 Days? (No if blank) Yes  Patient at risk for readmission?: Yes    Discharge Plan  Screen findings are: Discharge planning needs identified or anticipated (Comment).(TBD)    Expected Discharge Date: 02/26/2019    Expected Transfer from Critical Care:  N/A    Patient and/or family were provided with choice of facilities / services that are available and appropriate to meet post hospital care needs?: No       Initial Assessment complete?: Yes

## 2019-02-26 DIAGNOSIS — I252 Old myocardial infarction: Secondary | ICD-10-CM | POA: Diagnosis not present

## 2019-02-26 DIAGNOSIS — C9200 Acute myeloblastic leukemia, not having achieved remission: Secondary | ICD-10-CM | POA: Diagnosis not present

## 2019-02-26 DIAGNOSIS — J441 Chronic obstructive pulmonary disease with (acute) exacerbation: Secondary | ICD-10-CM | POA: Diagnosis not present

## 2019-02-26 DIAGNOSIS — D61818 Other pancytopenia: Secondary | ICD-10-CM | POA: Diagnosis not present

## 2019-02-26 DIAGNOSIS — H9190 Unspecified hearing loss, unspecified ear: Secondary | ICD-10-CM | POA: Diagnosis not present

## 2019-02-26 DIAGNOSIS — E119 Type 2 diabetes mellitus without complications: Secondary | ICD-10-CM | POA: Diagnosis not present

## 2019-02-26 DIAGNOSIS — D696 Thrombocytopenia, unspecified: Secondary | ICD-10-CM | POA: Diagnosis not present

## 2019-02-26 DIAGNOSIS — I2510 Atherosclerotic heart disease of native coronary artery without angina pectoris: Secondary | ICD-10-CM | POA: Diagnosis not present

## 2019-02-26 DIAGNOSIS — R197 Diarrhea, unspecified: Secondary | ICD-10-CM | POA: Diagnosis not present

## 2019-02-26 LAB — MANUAL DIFFERENTIAL
BLASTS - REL (DIFF): 9 % (ref <=0)
EOSINOPHILS - ABS (DIFF): 0 10*9/L (ref 0.0–0.4)
EOSINOPHILS - REL (DIFF): 2 %
LYMPHOCYTES - ABS (DIFF): 0.2 10*9/L — ABNORMAL LOW (ref 1.5–5.0)
MONOCYTES - ABS (DIFF): 0.1 10*9/L — ABNORMAL LOW (ref 0.2–0.8)
MONOCYTES - REL (DIFF): 16 %
NEUTROPHILS - ABS (DIFF): 0.1 10*9/L — CL (ref 2.0–7.5)
NEUTROPHILS - REL (DIFF): 15 %

## 2019-02-26 LAB — CBC W/ AUTO DIFF
HEMATOCRIT: 23.6 % — ABNORMAL LOW (ref 41.0–53.0)
HEMOGLOBIN: 7.8 g/dL — ABNORMAL LOW (ref 13.5–17.5)
MEAN CORPUSCULAR HEMOGLOBIN CONC: 33.1 g/dL (ref 31.0–37.0)
MEAN CORPUSCULAR HEMOGLOBIN: 31.5 pg (ref 26.0–34.0)
MEAN CORPUSCULAR VOLUME: 95.1 fL (ref 80.0–100.0)
NUCLEATED RED BLOOD CELLS: 13 /100{WBCs} — ABNORMAL HIGH (ref <=4)
PLATELET COUNT: 33 10*9/L — ABNORMAL LOW (ref 150–440)
RED CELL DISTRIBUTION WIDTH: 17.9 % — ABNORMAL HIGH (ref 12.0–15.0)
WBC ADJUSTED: 0.4 10*9/L — CL (ref 4.5–11.0)

## 2019-02-26 LAB — COMPREHENSIVE METABOLIC PANEL
ALBUMIN: 2.6 g/dL — ABNORMAL LOW (ref 3.5–5.0)
ALKALINE PHOSPHATASE: 148 U/L — ABNORMAL HIGH (ref 38–126)
ALT (SGPT): 77 U/L — ABNORMAL HIGH (ref <50)
AST (SGOT): 92 U/L — ABNORMAL HIGH (ref 19–55)
BILIRUBIN TOTAL: 1.4 mg/dL — ABNORMAL HIGH (ref 0.0–1.2)
BUN / CREAT RATIO: 29
CALCIUM: 7.8 mg/dL — ABNORMAL LOW (ref 8.5–10.2)
CHLORIDE: 95 mmol/L — ABNORMAL LOW (ref 98–107)
CO2: >40 mmol/L (ref 22.0–30.0)
CREATININE: 0.69 mg/dL — ABNORMAL LOW (ref 0.70–1.30)
EGFR CKD-EPI AA MALE: >90 mL/min/{1.73_m2} (ref >=60)
EGFR CKD-EPI NON-AA MALE: >90 mL/min/{1.73_m2} (ref >=60)
GLUCOSE RANDOM: 113 mg/dL (ref 70–179)
POTASSIUM: 3.9 mmol/L (ref 3.5–5.0)
PROTEIN TOTAL: 5.2 g/dL — ABNORMAL LOW (ref 6.5–8.3)
SODIUM: 135 mmol/L (ref 135–145)

## 2019-02-26 LAB — NEUTROPHILS - ABS (DIFF): Neutrophils:NCnc:Pt:Bld:Qn:: 0.1 — CL

## 2019-02-26 LAB — ALT (SGPT): Alanine aminotransferase:CCnc:Pt:Ser/Plas:Qn:: 77 — ABNORMAL HIGH

## 2019-02-26 LAB — MEAN CORPUSCULAR HEMOGLOBIN CONC: Lab: 33.1

## 2019-02-26 MED ORDER — AMINOCAPROIC ACID 500 MG TABLET: 1 g | tablet | Freq: Three times a day (TID) | 0 refills | 30 days

## 2019-02-26 MED ORDER — PREDNISONE 20 MG TABLET: 10 mg | tablet | Freq: Every day | 0 refills | 4 days

## 2019-02-26 MED ORDER — AMINOCAPROIC ACID 500 MG TABLET: 1000 mg | tablet | Freq: Three times a day (TID) | 0 refills | 30 days | Status: AC

## 2019-02-26 MED FILL — AMINOCAPROIC ACID 500 MG TABLET: 30 days supply | Qty: 180 | Fill #0 | Status: AC

## 2019-02-26 MED FILL — AMINOCAPROIC ACID 500 MG TABLET: ORAL | 30 days supply | Qty: 180 | Fill #0

## 2019-02-26 NOTE — Unmapped (Signed)
Pt VSS, afebrile. A&O x 4, not acute issues during shift. Pt received 1 U of both pRBC & plat. Pt is to discharge home per provider order.        Problem: Adult Inpatient Plan of Care  Goal: Plan of Care Review  Outcome: Resolved  Goal: Patient-Specific Goal (Individualization)  Outcome: Resolved  Goal: Absence of Hospital-Acquired Illness or Injury  Outcome: Resolved  Goal: Optimal Comfort and Wellbeing  Outcome: Resolved  Goal: Readiness for Transition of Care  Outcome: Resolved  Goal: Rounds/Family Conference  Outcome: Resolved     Problem: Wound  Goal: Optimal Wound Healing  Outcome: Resolved     Problem: Self-Care Deficit  Goal: Improved Ability to Complete Activities of Daily Living  Outcome: Resolved     Problem: Infection  Goal: Infection Symptom Resolution  Outcome: Resolved

## 2019-02-26 NOTE — Unmapped (Signed)
Pt is A&O x 4 with intellectual delay, VSS, afebrile, no acute issues or changes in care during shift. Pt is stable WCTM      Problem: Adult Inpatient Plan of Care  Goal: Plan of Care Review  Outcome: Ongoing - Unchanged  Goal: Patient-Specific Goal (Individualization)  Outcome: Ongoing - Unchanged  Goal: Absence of Hospital-Acquired Illness or Injury  Outcome: Ongoing - Unchanged  Goal: Optimal Comfort and Wellbeing  Outcome: Ongoing - Unchanged  Goal: Readiness for Transition of Care  Outcome: Ongoing - Unchanged  Goal: Rounds/Family Conference  Outcome: Ongoing - Unchanged     Problem: Wound  Goal: Optimal Wound Healing  Outcome: Ongoing - Unchanged     Problem: Self-Care Deficit  Goal: Improved Ability to Complete Activities of Daily Living  Outcome: Ongoing - Unchanged     Problem: Infection  Goal: Infection Symptom Resolution  Outcome: Ongoing - Unchanged

## 2019-02-27 NOTE — Unmapped (Signed)
Physician Discharge Summary [facility] : [facility]  4 ONC [facility]  [address]  [place] [address]  Dept: [phone]  Loc: [phone]     Identifying Information:   Raymond [name]  [birth year]  [id number]    Primary Care Physician: Donnald Garre, MD     Referring Physician: Georgian Co     Code Status: Full Code    Admit Date: 02/24/2019    Discharge Date: 02/26/2019    Discharge To: Home with Home Health and/or PT/OT    Discharge Service: [facility] - MDE - Hematology Teaching     Discharge Attending Physician: No att. 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 *    Hospital Course:   [ ]  Continue Prednisone until 02/28/2019    Raymond [name] is a 63 y.o. y/o male with PMHx as noted below that presented to [facility] with diarrhea and anemia.  ??  Anemia, C/f GI bleed with hx of melena:??Hemoglobin 6.7 at the outside hospital; initial concern for GI bleed given recent dark colored stools. Patient had normal Hgb in 2018. Upon diagnosis of his AML, has had a hemoglobin fluctuating between 6 and 9. S/p 2 uPRBc with improvement in Hgb. Initially managed at GIB and started in IV PPI. However, there was no evidence blood with bowel movements during admission and Hgb remained stable. Anemia felt to be primarily related to his AML. Diarrhea:??Patient described being constipated 4 days prior to admission and was given a laxative resulting in diarrhea/dark colored stools. Also with diffuse chronic abdominal pain for the past few months. Diarrhea resolved during admission so C. Diff testing or other infectious work-up not pursued. Considered also medication induced diarrhea in setting of posaconazole??and venetoclax however patient continued on these medications during admission.  ??  AML, with MRC, complex karyotype, TP53 mutation c/b pancytopenia:??Recently discharged on 10/12 for new diagnosis of AML. Underwent BMbx which confirmed AML with TP53 mutation. S/p C1D1 on 10/6 with venetoclax. Continued on home Valtrex, levaquin, posaconazole and venetoclax. PLTs 36 on admission, started on Tranexamic acid. Transitioned to Aminocaproic acid and transfused 1u PRBc, 1u PLT on day of discharge.  ??  Chronic conditions: Managed as below.  Pain management: cont home tramadol, tylenol  HTN: cont home amlodipine, imdur,metop, lasix  CAD: Cont home metop  COPD exacerbation on previous admission: cont pred taper @10mg   ??    Procedures:  No admission procedures for hospital encounter.  ______________________________________________________________________  Discharge Medications:     Your Medication List      START taking these medications    aminocaproic acid 500 mg tablet  Commonly known as: AMICAR  Take 2 tablets (1,000 mg total) by mouth Three (3) times a day.        CHANGE how you take these medications    predniSONE 20 MG tablet  Commonly known as: DELTASONE  Take 0.5 tablets (10 mg total) by mouth daily for 2 days.  What changed: See the new instructions.        CONTINUE taking these medications    acetaminophen 650 MG CR tablet  Commonly known as: TYLENOL 8 HOUR  Take 1 tablet (650 mg total) by mouth every eight (8) hours as needed  for pain.     albuterol 90 mcg/actuation inhaler  Commonly known as: PROVENTIL HFA;VENTOLIN HFA Inhale 2 puffs every four (4) hours as needed.     amLODIPine 5 MG tablet  Commonly known as: NORVASC  Take 1 tablet (5 mg total) by mouth daily.     citalopram 20 MG tablet  Commonly known as: CeleXA  Take 1 tablet (20 mg total) by mouth daily.     diclofenac sodium 1 % gel  Commonly known as: VOLTAREN  Apply 2 g topically.     dicyclomine 10 mg capsule  Commonly known as: BENTYL  Take 10 mg by mouth Two (2) times a day.     DOK 100 MG capsule  Generic drug: docusate sodium  Take 1 capsule (100 mg total) by mouth daily.     FLECTOR 1.3 % Pt12  Generic drug: diclofenac epolamine  Place 1 patch on the skin daily.     furosemide 40 MG tablet  Commonly known as: LASIX  Take 1 tablet (40 mg total) by mouth daily.     isosorbide mononitrate 30 MG 24 hr tablet  Commonly known as: IMDUR  Take 1 tablet (30 mg total) by mouth daily.     levoFLOXacin 500 MG tablet  Commonly known as: LEVAQUIN  Take 1 tablet (500 mg total) by mouth daily.     loperamide 2 mg tablet  Commonly known as: IMODIUM A-D  Take 1 tablet (2 mg total) by mouth 4 (four) times a day as needed for diarrhea.     metoprolol succinate 25 MG 24 hr tablet  Commonly known as: TOPROL-XL  Take 1 tablet (25 mg total) by mouth daily.     nitroglycerin 0.4 MG SL tablet  Commonly known as: NITROSTAT  Place 1 tablet (0.4 mg total) under the tongue every five (5) minutes as needed for chest pain.     ondansetron 4 MG tablet  Commonly known as: ZOFRAN  Take 1 tablet (4 mg total) by mouth every eight (8) hours as needed for nausea for up to 30 doses.     pantoprazole 40 MG tablet  Commonly known as: PROTONIX  Take 1 tablet (40 mg total) by mouth Two (2) times a day.     posaconazole 100 mg Tbec delayed released tablet  Commonly known as: NOXAFIL  Take 3 tablets (300 mg) by mouth daily.     SENNA LAX 8.6 mg tablet  Generic drug: senna  Take 2 tablets by mouth nightly.     SYMBICORT 160-4.5 mcg/actuation inhaler  Generic drug: budesonide-formoteroL INHALE TWO PUFFS BY MOUTH TWICE DAILY     traMADoL 50 mg tablet  Commonly known as: ULTRAM  Take 1 tablet (50 mg total) by mouth every eight (8) hours as needed for pain.     valACYclovir 500 MG tablet  Commonly known as: VALTREX  Take 1 tablet (500 mg total) by mouth daily.     venetoclax 100 mg tablet  Commonly known as: VENCLEXTA  Take 1 tablet (100 mg total) by mouth daily with a meal and water. Do not chew, crush, or break tablets.            Allergies:  Patient has no known allergies.  ______________________________________________________________________  Pending Test Results (if blank, then none):      Most Recent Labs:  CBC -   Results in Past 2 Days  Result Component Current Result   WBC 0.4 (LL) (02/26/2019)   RBC 2.48 (L) (02/26/2019)  HGB 7.8 (L) (02/26/2019)   HCT 23.6 (L) (02/26/2019)   MCV 95.1 (02/26/2019)   MCH 31.5 (02/26/2019)   MCHC 33.1 (02/26/2019)   MPV 10.6 (H) (02/26/2019)   Platelet 33 (L) (02/26/2019)     BMP -   Results in Past 2 Days  Result Component Current Result   Sodium 135 (02/26/2019)   Potassium 3.9 (02/26/2019)   Chloride 95 (L) (02/26/2019)   CO2 >40.0 (HH) (02/26/2019)   BUN 20 (02/26/2019)   Creatinine 0.69 (L) (02/26/2019)   EST.GFR (MDRD) Not in Time Range   Glucose 113 (02/26/2019)     Coagulation -   No results found for requested labs within last 2 days.     Cardiac markers -   No results found for requested labs within last 2 days.     ABGs-   No results found for requested labs within last 2 days.     LFT's -   Results in Past 2 Days  Result Component Current Result   Albumin 2.6 (L) (02/26/2019)   ALT 77 (H) (02/26/2019)   AST 92 (H) (02/26/2019)   Alkaline Phosphatase 148 (H) (02/26/2019)   Total Bilirubin 1.4 (H) (02/26/2019)   Bilirubin, Direct Not in Time Range    Not in Time Range       Relevant Studies/Radiology (if blank, then none):  No results found.  ______________________________________________________________________  Discharge Instructions: Activity Instructions     Activity as tolerated                Other Instructions     Call MD for:  difficulty breathing, headache or visual disturbances      Call MD for:  persistent nausea or vomiting      Call MD for:  severe uncontrolled pain      Call MD for:  temperature >38.5 Celsius      Discharge instructions      It was a pleasure taking care of you!    You were admitted to [facility] for evaluation of diarrhea and anemia. Your diarrhea started after you were started on a laxative, and appears most likely to be due to the medication as your symptoms improved during your hospital stay. Your hemoglobin levels remained stable so it appears unlikely that you have a bleed in your bowels. Please follow-up as scheduled or sooner if you notice recurrence of your symptoms.    Follow-up Plan after discharge:  Issues related to your hospitalization: During your hospitalization, your platelets were noted to be low. Because of this, you were started on a medication to decrease your risk of bleeding. You will be discharged on a similar medication which we would like you to take as prescribed to prevent bleeding in the future.  Follow-up appointment for lab draws: Please follow up as scheduled for any additional lab work.  Follow-up appointment with [facility]: You have appointments scheduled next Friday, and next Monday with plans for a bone marrow biopsy next week.    During this COVID-19 outbreak, please avoid and or minimize close contact with members outside of your immediate family. If you must be in public wear a mask and remain 6 feet from others.  Avoid large crowds or situations with close contact of multiple people. REMEMBER to wash your hands frequently and increase this frequency when in public. Please make sure you have a functioning thermometer at home.?? If you are feeling poorly, especially if you have chills, shaking, muscle aches or lightheadedness, measure your  temperature. If it is more than 100.5 Farenheit, call the nurse triage line during daytime hours (Monday through Friday 8AM - 5PM: [phone]) or on nights and weekends, the on-call doctor by calling the hospital operator [phone]) and asking for the on-call adult oncologist. Alternatively, if you develop a fever and you are currently taking or have recently received chemotherapy, you may proceed directly to your local emergency room as this??can be a medical emergency. Be sure to inform the emergency room provider if you have recently received chemotherapy. You may have blood drawn for blood cultures and receive IV antibiotics.    Following discharge from the hospital, if you develop or notice worsening of any symptoms such as nausea, vomiting, chest pain, shortness of breath, fevers, or chills, please return to the emergency department.??     If you develop these symptoms, or if you have trouble obtaining any of your medications you may call the [facility] Communication Center to speak with the triage team at [phone] if Monday through Friday 8am-5pm or call [phone] after hours.      For appointments & questions Monday through Friday 8 AM - 5 PM   please call [phone] or toll free [phone].    On Nights, Weekends and Holidays  Call [phone] and ask for the oncologist on call.    [facility]  [address]  [place], [place] [address]  [email or web address] COVID-19 is a new challenge, but [facility] and the [facility] is dedicated to providing you and your loved ones with the best possible cancer care and support in the safest way possible during this time. We made two videos about the ways we are working to keep you safe, such as offering the option to visit your care team over the phone or through a video, as well as support services offered for our patients and their caregivers. If you have any questions about your cancer care, please call your care team.  ??  Video #1: Keeping [facility] patients safe during the COVID-19 crisis  http://go.eabjmlille.com  ??  Video #2: Support for cancer patients and their caregivers during the COVID-19 pandemic  http://go.SecureGap.uy               Follow Up instructions and Outpatient Referrals     Call MD for:  difficulty breathing, headache or visual disturbances      Call MD for:  persistent nausea or vomiting      Call MD for:  severe uncontrolled pain      Call MD for:  temperature >38.5 Celsius      Discharge instructions            Appointments which have been scheduled for you    Feb 28, 2019  1:00 PM  (Arrive by 12:30 PM)  NURSE LAB DRAW with ADULT ONC LAB  [facility] ADULT ONCOLOGY LAB DRAW STATION [place] [facility] REGION) [address]  [place] [address]  [phone]      Feb 28, 2019  2:00 PM  (Arrive by 1:30 PM)  BLOOD TRANSFUSION - 2 UNITS with ONCINF CHAIR 40  [place] ONCOLOGY INFUSION [place] [facility] REGION) [address]  [place] [address]  [phone]      Mar 03, 2019 12:00 PM  (Arrive by 11:30 AM)  NURSE LAB DRAW with ADULT ONC LAB  [facility] ADULT ONCOLOGY LAB DRAW STATION [place]) [address]  [place] [address]  [phone]      Mar 03, 2019  1:00 PM  (Arrive by 12:30 PM)  BLOOD TRANSFUSION - 2 UNITS with [employer] CHAIR 14 [place] ONCOLOGY INFUSION [facility] [facility] REGION) [address]  [place] [address]  [phone]      Mar 06, 2019 10:30 AM  (Arrive by 10:00 AM)  NURSE LAB DRAW with ADULT ONC LAB  [facility] ADULT ONCOLOGY LAB DRAW STATION [place] [facility] REGION) [address]  [place] [address]  [phone]      Mar 06, 2019 11:30 AM  (Arrive by 11:00 AM)  BONE MARROW BIOPSY with Vernie Murders, AGNP  [place] ONCOLOGY INFUSION [place] [facility] REGION) [address]  [place] [address]  [phone]      Mar 06, 2019  1:00 PM  (Arrive by 12:30 PM)  BLOOD TRANSFUSION - 2 UNITS with [employer] CHAIR 08  [place] ONCOLOGY INFUSION [place] [facility] REGION) [address]  [place] [address]  [phone]      Mar 10, 2019 11:00 AM  (Arrive by 10:30 AM)  NURSE LAB DRAW with ADULT ONC LAB  [facility] ADULT ONCOLOGY LAB DRAW STATION [place] [facility] REGION) [address]  [place] [address]  [phone]      Mar 10, 2019 12:00 PM  (Arrive by 11:30 AM)  BLOOD TRANSFUSION - 2 UNITS with ONCDEV CHAIR 52  [place] ONCOLOGY INFUSION [place] [facility] REGION) [address]  [place] [address]  [phone]      Mar 13, 2019 10:00 AM  (Arrive by 9:30 AM)  NURSE LAB DRAW with ADULT ONC LAB  [facility] ADULT ONCOLOGY LAB DRAW STATION [facility] REGION) [address]  [place] [address]  [phone]      Mar 13, 2019 11:00 AM  (Arrive by 10:30 AM)  RETURN ACTIVE [place] with Virgil Benedict, [place]  [place] HEMATOLOGY ONCOLOGY [facility] REGION) [address]  [place] [address]  [phone]      Mar 13, 2019 12:00 PM  (Arrive by 11:30 AM)  BLOOD TRANSFUSION - 2 UNITS with ONCINF CHAIR 14   ONCOLOGY INFUSION [place] [facility] REGION) [address]  [place] [address]  [phone] ______________________________________________________________________  Discharge Day Services:  BP 152/73  - Pulse 75  - Temp 37.2 ??C (Oral)  - Resp 18  - Wt (!) 102.7 kg (226 lb 6.6 oz)  - SpO2 92%  - BMI 32.78 kg/m??   Pt seen on the day of discharge and determined appropriate for discharge.    Condition at Discharge: good    Length of Discharge: I spent greater than 30 mins in the discharge of this patient.

## 2019-02-27 NOTE — Unmapped (Addendum)
[ ]   Continue Prednisone until 02/28/2019    Raymond [name] is a 63 y.o. y/o male with PMHx as noted below that presented to [facility] with diarrhea and anemia.  ??  Anemia, C/f GI bleed with hx of melena:??Hemoglobin 6.7 at the outside hospital; initial concern for GI bleed given recent dark colored stools. Patient had normal Hgb in 2018. Upon diagnosis of his AML, has had a hemoglobin fluctuating between 6 and 9. S/p 2 uPRBc with improvement in Hgb. Initially managed at GIB and started in IV PPI. However, there was no evidence blood with bowel movements during admission and Hgb remained stable. Anemia felt to be primarily related to his AML.     Diarrhea:??Patient described being constipated 4 days prior to admission and was given a laxative resulting in diarrhea/dark colored stools. Also with diffuse chronic abdominal pain for the past few months. Diarrhea resolved during admission so C. Diff testing or other infectious work-up not pursued. Considered also medication induced diarrhea in setting of posaconazole??and venetoclax however patient continued on these medications during admission.  ??  AML, with MRC, complex karyotype, TP53 mutation c/b pancytopenia:??Recently discharged on 10/12 for new diagnosis of AML. Underwent BMbx which confirmed AML with TP53 mutation. S/p C1D1 on 10/6 with venetoclax. Continued on home Valtrex, levaquin, posaconazole and venetoclax. PLTs 36 on admission, started on Tranexamic acid. Transitioned to Aminocaproic acid and transfused 1u PRBc, 1u PLT on day of discharge.  ??  Chronic conditions: Managed as below.  Pain management: cont home tramadol, tylenol  HTN: cont home amlodipine, imdur,metop, lasix  CAD: Cont home metop  COPD exacerbation on previous admission: cont pred taper @10mg

## 2019-02-28 DIAGNOSIS — C9200 Acute myeloblastic leukemia, not having achieved remission: Principal | ICD-10-CM

## 2019-02-28 LAB — CBC W/ AUTO DIFF
HEMATOCRIT: 26.1 % — ABNORMAL LOW (ref 41.0–53.0)
MEAN CORPUSCULAR HEMOGLOBIN CONC: 32.5 g/dL (ref 31.0–37.0)
MEAN CORPUSCULAR VOLUME: 94.9 fL (ref 80.0–100.0)
MEAN PLATELET VOLUME: 10.6 fL — ABNORMAL HIGH (ref 7.0–10.0)
NUCLEATED RED BLOOD CELLS: 15 /100{WBCs} — ABNORMAL HIGH (ref <=4)
PLATELET COUNT: 31 10*9/L — ABNORMAL LOW (ref 150–440)
RED BLOOD CELL COUNT: 2.75 10*12/L — ABNORMAL LOW (ref 4.50–5.90)
RED CELL DISTRIBUTION WIDTH: 18.1 % — ABNORMAL HIGH (ref 12.0–15.0)
WBC ADJUSTED: 0.4 10*9/L — CL (ref 4.5–11.0)

## 2019-02-28 LAB — COMPREHENSIVE METABOLIC PANEL
ALBUMIN: 2.9 g/dL — ABNORMAL LOW (ref 3.5–5.0)
ALT (SGPT): 159 U/L — ABNORMAL HIGH (ref <50)
AST (SGOT): 144 U/L — ABNORMAL HIGH (ref 19–55)
BILIRUBIN TOTAL: 1.9 mg/dL — ABNORMAL HIGH (ref 0.0–1.2)
BLOOD UREA NITROGEN: 33 mg/dL — ABNORMAL HIGH (ref 7–21)
BUN / CREAT RATIO: 34
CALCIUM: 8.3 mg/dL — ABNORMAL LOW (ref 8.5–10.2)
CHLORIDE: 88 mmol/L — ABNORMAL LOW (ref 98–107)
CO2: >40 mmol/L (ref 22.0–30.0)
CREATININE: 0.97 mg/dL (ref 0.70–1.30)
EGFR CKD-EPI AA MALE: >90 mL/min/{1.73_m2} (ref >=60)
GLUCOSE RANDOM: 148 mg/dL (ref 70–179)
POTASSIUM: 4.2 mmol/L (ref 3.5–5.0)
PROTEIN TOTAL: 5.8 g/dL — ABNORMAL LOW (ref 6.5–8.3)
SODIUM: 133 mmol/L — ABNORMAL LOW (ref 135–145)

## 2019-02-28 LAB — PLATELET COUNT: Platelets:NCnc:Pt:Bld:Qn:Automated count: 66 — ABNORMAL LOW

## 2019-02-28 LAB — BASOPHILS - REL (DIFF): Basophils/100 leukocytes:NFr:Pt:Bld:Qn:Manual count: 2

## 2019-02-28 LAB — MANUAL DIFFERENTIAL
BASOPHILS - REL (DIFF): 2 %
BLASTS - REL (DIFF): 4 % (ref <=0)
EOSINOPHILS - ABS (DIFF): 0 10*9/L (ref 0.0–0.4)
LYMPHOCYTES - ABS (DIFF): 0.2 10*9/L — ABNORMAL LOW (ref 1.5–5.0)
LYMPHOCYTES - REL (DIFF): 44 %
MONOCYTES - ABS (DIFF): 0.2 10*9/L (ref 0.2–0.8)
MONOCYTES - REL (DIFF): 41 %

## 2019-02-28 LAB — BILIRUBIN DIRECT: Bilirubin.glucuronidated+Bilirubin.albumin bound:MCnc:Pt:Ser/Plas:Qn:: 1.1 — ABNORMAL HIGH

## 2019-02-28 LAB — HEMATOCRIT: Hematocrit:VFr:Pt:Bld:Qn:: 26.1 — ABNORMAL LOW

## 2019-02-28 LAB — ANION GAP: Anion gap 3:SCnc:Pt:Ser/Plas:Qn:: 0

## 2019-02-28 MED ORDER — NYSTATIN 100,000 UNIT/ML ORAL SUSPENSION: 500000 [IU] | mL | Freq: Four times a day (QID) | 0 refills | 3 days | Status: AC

## 2019-02-28 NOTE — Unmapped (Signed)
Pt tolerated unit of platelets without adverse reaction. Danella Maiers Dimitriy Carreras,RN

## 2019-02-28 NOTE — Unmapped (Signed)
NP paged:    Kauffmann/chair 38 pt plt 31 and threshold is 30, he is due back late next week. also, he would like to speak w someone re bowel movements. thx [place]rie [place] [id number]    Danella Maiers Chamia Schmutz,RN

## 2019-02-28 NOTE — Unmapped (Addendum)
Dr. Vertell Limber would like you to STOP taking Amicar. He thinks this is causing your lab abnormalities.    FOR CONSTIPATION:  ??  Docusate sodium (colace) 100mg  by mouth twice a day (may decrease if liquid stool)  ??  Polyethylene glycol (miralax) Drink one capful (17 grams) in 4-8 ounces of warm or cold beverage once a day (may take up to three times a day for constipation)   ??  Senna Take 2 tablets po once daily, preferable at bedtime if no BM during day    Lab on 02/28/2019   Component Date Value Ref Range Status   ??? Sodium 02/28/2019 133* 135 - 145 mmol/L Final   ??? Potassium 02/28/2019 4.2  3.5 - 5.0 mmol/L Final   ??? Chloride 02/28/2019 88* 98 - 107 mmol/L Final   ??? Anion Gap 02/28/2019    Final    Unable to calculate.   ??? CO2 02/28/2019 >40.0* 22.0 - 30.0 mmol/L Final   ??? BUN 02/28/2019 33* 7 - 21 mg/dL Final   ??? Creatinine 02/28/2019 0.97  0.70 - 1.30 mg/dL Final   ??? BUN/Creatinine Ratio 02/28/2019 34   Final   ??? EGFR CKD-EPI Non-African American,* 02/28/2019 83  >=60 mL/min/1.54m2 Final   ??? EGFR CKD-EPI African American, Male 02/28/2019 >90  >=60 mL/min/1.10m2 Final   ??? Glucose 02/28/2019 148  70 - 179 mg/dL Final   ??? Calcium 29/56/2130 8.3* 8.5 - 10.2 mg/dL Final   ??? Albumin 86/57/8469 2.9* 3.5 - 5.0 g/dL Final   ??? Total Protein 02/28/2019 5.8* 6.5 - 8.3 g/dL Final   ??? Total Bilirubin 02/28/2019 1.9* 0.0 - 1.2 mg/dL Final   ??? AST 62/95/2841 144* 19 - 55 U/L Final   ??? ALT 02/28/2019 159* <50 U/L Final   ??? Alkaline Phosphatase 02/28/2019 161* 38 - 126 U/L Final   ??? WBC 02/28/2019 0.4* 4.5 - 11.0 10*9/L Final   ??? RBC 02/28/2019 2.75* 4.50 - 5.90 10*12/L Final   ??? HGB 02/28/2019 8.5* 13.5 - 17.5 g/dL Final   ??? HCT 32/44/0102 26.1* 41.0 - 53.0 % Final   ??? MCV 02/28/2019 94.9  80.0 - 100.0 fL Final   ??? MCH 02/28/2019 30.8  26.0 - 34.0 pg Final   ??? MCHC 02/28/2019 32.5  31.0 - 37.0 g/dL Final   ??? RDW 72/53/6644 18.1* 12.0 - 15.0 % Final   ??? MPV 02/28/2019 10.6* 7.0 - 10.0 fL Final ??? Platelet 02/28/2019 31* 150 - 440 10*9/L Final   ??? nRBC 02/28/2019 15* <=4 /100 WBCs Final   ??? Neutrophil Left Shift 02/28/2019 1+* Not Present Final   ??? Macrocytosis 02/28/2019 Slight* Not Present Final   ??? Anisocytosis 02/28/2019 Moderate* Not Present Final   ??? Neutrophils % 02/28/2019 8  % Final   ??? Lymphocytes % 02/28/2019 44  % Final   ??? Monocytes % 02/28/2019 41  % Final   ??? Eosinophils % 02/28/2019 1  % Final   ??? Basophils % 02/28/2019 2  % Final   ??? Blasts % 02/28/2019 4* <=0 % Final   ??? Absolute Neutrophils 02/28/2019 0.0* 2.0 - 7.5 10*9/L Final   ??? Absolute Lymphocytes 02/28/2019 0.2* 1.5 - 5.0 10*9/L Final   ??? Absolute Monocytes 02/28/2019 0.2  0.2 - 0.8 10*9/L Final   ??? Absolute Eosinophils 02/28/2019 0.0  0.0 - 0.4 10*9/L Final   ??? Absolute Basophils 02/28/2019 0.0  0.0 - 0.1 10*9/L Final   ??? Smear Review Comments 02/28/2019 See Comment* Undefined Final  Slide reviewed. Blasts Present.   ??? Bilirubin, Direct 02/28/2019 1.10* 0.00 - 0.40 mg/dL Final   Hospital Outpatient Visit on 02/28/2019   Component Date Value Ref Range Status   ??? Unit Blood Type 02/28/2019 A Pos   Final   ??? ISBT Number 02/28/2019 [id number]   Final   ??? Unit # 02/28/2019 [id number]   Final   ??? Status 02/28/2019 Ready   Final   ??? Product ID 02/28/2019 Platelets   Final   ??? PRODUCT CODE 02/28/2019 [id number]   Final

## 2019-02-28 NOTE — Unmapped (Signed)
Labs drawn and sent.  Care provided by [facility] RN

## 2019-02-28 NOTE — Unmapped (Signed)
[facility] Specialty Medication Onboarding    Specialty Medication: VENCLEXTA  Prior Authorization: Not Required   Financial Assistance: No - copay  <$25  Final Copay/Day Supply: $0 / 30 DAYS    Insurance Restrictions: None     Notes to Pharmacist: DOSE CHANGE    The triage team has completed the benefits investigation and has determined that the patient is able to fill this medication at [facility]. Please contact the patient to complete the onboarding or follow up with the prescribing physician as needed.

## 2019-03-01 NOTE — Unmapped (Signed)
Encounter addended by: Ivan Croft, FNP on: 02/28/2019 5:04 PM   Actions taken: Pharmacy for encounter modified, Order list changed, Diagnosis association updated

## 2019-03-02 DIAGNOSIS — Z20828 Contact with and (suspected) exposure to other viral communicable diseases: Secondary | ICD-10-CM | POA: Diagnosis not present

## 2019-03-02 DIAGNOSIS — D61818 Other pancytopenia: Secondary | ICD-10-CM | POA: Diagnosis not present

## 2019-03-02 DIAGNOSIS — Z03818 Encounter for observation for suspected exposure to other biological agents ruled out: Secondary | ICD-10-CM | POA: Diagnosis not present

## 2019-03-02 DIAGNOSIS — R600 Localized edema: Secondary | ICD-10-CM | POA: Diagnosis not present

## 2019-03-02 DIAGNOSIS — R531 Weakness: Secondary | ICD-10-CM | POA: Diagnosis not present

## 2019-03-02 DIAGNOSIS — D709 Neutropenia, unspecified: Secondary | ICD-10-CM | POA: Diagnosis not present

## 2019-03-02 DIAGNOSIS — D638 Anemia in other chronic diseases classified elsewhere: Secondary | ICD-10-CM | POA: Diagnosis not present

## 2019-03-02 DIAGNOSIS — C9200 Acute myeloblastic leukemia, not having achieved remission: Secondary | ICD-10-CM | POA: Diagnosis not present

## 2019-03-02 DIAGNOSIS — R918 Other nonspecific abnormal finding of lung field: Secondary | ICD-10-CM | POA: Diagnosis not present

## 2019-03-02 NOTE — Unmapped (Signed)
Hem/Onc Phone Triage Note    Caller: [name], patient's caregiver    Reason for Call:   Raymond [name] is a 63 y.o. M with TP53-mutated AML on C1 azacitidine/venetoclax. Primary oncologist: Dr. Vertell Limber. [name] reports she is concerned Mr. [name] is 'going into liver failure. She reports his urine has become really dark over past 2-3 days. He has also been complaining of pain in the center of his abdomen and has been since yesterday. He has been sleeping much more than usual over past 2-3 days. He does not feel febrile but their thermometer is currently not working. No bruising or bleeding. She has not noticed his skin or eyes are yellow. Of note, his LFTs were elevated at last visit 10/23 with bili 1.9, AST 144, ALT 159, alk phos 161. I told [name] I am also worried about his symptoms in the setting of rising LFTs and he should be evaluated with labs and an exam today. [name] will take him to their local ED. I told her to have the ED contact us for a possible transfer if the patient warrants admission.     Message routed to Dr. Vertell Limber and his team.     Fellow Taking Call:  Starr Sinclair  March 02, 2019 1:15 PM

## 2019-03-03 ENCOUNTER — Ambulatory Visit
Admit: 2019-03-03 | Discharge: 2019-03-12 | Disposition: A | Discharge status: Hospice | Payer: MEDICARE | Source: Other Acute Inpatient Hospital

## 2019-03-03 ENCOUNTER — Other Ambulatory Visit
Admit: 2019-03-03 | Discharge: 2019-03-12 | Disposition: A | Discharge status: Hospice | Payer: MEDICARE | Source: Other Acute Inpatient Hospital

## 2019-03-03 DIAGNOSIS — R279 Unspecified lack of coordination: Secondary | ICD-10-CM | POA: Diagnosis not present

## 2019-03-03 DIAGNOSIS — J811 Chronic pulmonary edema: Secondary | ICD-10-CM | POA: Diagnosis not present

## 2019-03-03 DIAGNOSIS — J189 Pneumonia, unspecified organism: Secondary | ICD-10-CM | POA: Diagnosis not present

## 2019-03-03 DIAGNOSIS — R569 Unspecified convulsions: Secondary | ICD-10-CM | POA: Diagnosis not present

## 2019-03-03 DIAGNOSIS — J810 Acute pulmonary edema: Secondary | ICD-10-CM | POA: Diagnosis not present

## 2019-03-03 DIAGNOSIS — E8770 Fluid overload, unspecified: Secondary | ICD-10-CM | POA: Diagnosis not present

## 2019-03-03 DIAGNOSIS — K567 Ileus, unspecified: Secondary | ICD-10-CM | POA: Diagnosis not present

## 2019-03-03 DIAGNOSIS — J449 Chronic obstructive pulmonary disease, unspecified: Secondary | ICD-10-CM | POA: Diagnosis not present

## 2019-03-03 DIAGNOSIS — Z20828 Contact with and (suspected) exposure to other viral communicable diseases: Secondary | ICD-10-CM | POA: Diagnosis not present

## 2019-03-03 DIAGNOSIS — R531 Weakness: Secondary | ICD-10-CM | POA: Diagnosis not present

## 2019-03-03 DIAGNOSIS — J440 Chronic obstructive pulmonary disease with acute lower respiratory infection: Secondary | ICD-10-CM | POA: Diagnosis not present

## 2019-03-03 DIAGNOSIS — R918 Other nonspecific abnormal finding of lung field: Secondary | ICD-10-CM | POA: Diagnosis not present

## 2019-03-03 DIAGNOSIS — R5081 Fever presenting with conditions classified elsewhere: Secondary | ICD-10-CM | POA: Diagnosis not present

## 2019-03-03 DIAGNOSIS — D61818 Other pancytopenia: Secondary | ICD-10-CM | POA: Diagnosis not present

## 2019-03-03 DIAGNOSIS — D696 Thrombocytopenia, unspecified: Secondary | ICD-10-CM | POA: Diagnosis not present

## 2019-03-03 DIAGNOSIS — R069 Unspecified abnormalities of breathing: Secondary | ICD-10-CM | POA: Diagnosis not present

## 2019-03-03 DIAGNOSIS — R5381 Other malaise: Secondary | ICD-10-CM | POA: Diagnosis not present

## 2019-03-03 DIAGNOSIS — G8929 Other chronic pain: Secondary | ICD-10-CM | POA: Diagnosis not present

## 2019-03-03 DIAGNOSIS — R29810 Facial weakness: Secondary | ICD-10-CM | POA: Diagnosis not present

## 2019-03-03 DIAGNOSIS — M6281 Muscle weakness (generalized): Secondary | ICD-10-CM | POA: Diagnosis not present

## 2019-03-03 DIAGNOSIS — R160 Hepatomegaly, not elsewhere classified: Secondary | ICD-10-CM | POA: Diagnosis not present

## 2019-03-03 DIAGNOSIS — E871 Hypo-osmolality and hyponatremia: Secondary | ICD-10-CM | POA: Diagnosis not present

## 2019-03-03 DIAGNOSIS — D638 Anemia in other chronic diseases classified elsewhere: Secondary | ICD-10-CM | POA: Diagnosis not present

## 2019-03-03 DIAGNOSIS — I959 Hypotension, unspecified: Secondary | ICD-10-CM | POA: Diagnosis not present

## 2019-03-03 DIAGNOSIS — R945 Abnormal results of liver function studies: Secondary | ICD-10-CM | POA: Diagnosis not present

## 2019-03-03 DIAGNOSIS — J90 Pleural effusion, not elsewhere classified: Secondary | ICD-10-CM | POA: Diagnosis not present

## 2019-03-03 DIAGNOSIS — R4182 Altered mental status, unspecified: Secondary | ICD-10-CM | POA: Diagnosis not present

## 2019-03-03 DIAGNOSIS — A499 Bacterial infection, unspecified: Secondary | ICD-10-CM | POA: Diagnosis not present

## 2019-03-03 DIAGNOSIS — R251 Tremor, unspecified: Secondary | ICD-10-CM | POA: Diagnosis not present

## 2019-03-03 DIAGNOSIS — R109 Unspecified abdominal pain: Secondary | ICD-10-CM | POA: Diagnosis not present

## 2019-03-03 DIAGNOSIS — G8194 Hemiplegia, unspecified affecting left nondominant side: Secondary | ICD-10-CM | POA: Diagnosis not present

## 2019-03-03 DIAGNOSIS — C9200 Acute myeloblastic leukemia, not having achieved remission: Secondary | ICD-10-CM | POA: Diagnosis not present

## 2019-03-03 DIAGNOSIS — I4891 Unspecified atrial fibrillation: Secondary | ICD-10-CM | POA: Diagnosis not present

## 2019-03-03 DIAGNOSIS — R609 Edema, unspecified: Secondary | ICD-10-CM | POA: Diagnosis not present

## 2019-03-03 DIAGNOSIS — J9621 Acute and chronic respiratory failure with hypoxia: Secondary | ICD-10-CM | POA: Diagnosis not present

## 2019-03-03 DIAGNOSIS — M25562 Pain in left knee: Secondary | ICD-10-CM | POA: Diagnosis not present

## 2019-03-03 DIAGNOSIS — R0902 Hypoxemia: Secondary | ICD-10-CM | POA: Diagnosis not present

## 2019-03-03 DIAGNOSIS — D61810 Antineoplastic chemotherapy induced pancytopenia: Secondary | ICD-10-CM | POA: Diagnosis not present

## 2019-03-03 DIAGNOSIS — J9601 Acute respiratory failure with hypoxia: Secondary | ICD-10-CM | POA: Diagnosis not present

## 2019-03-03 DIAGNOSIS — D709 Neutropenia, unspecified: Secondary | ICD-10-CM | POA: Diagnosis not present

## 2019-03-03 DIAGNOSIS — I10 Essential (primary) hypertension: Secondary | ICD-10-CM | POA: Diagnosis not present

## 2019-03-03 DIAGNOSIS — K5900 Constipation, unspecified: Secondary | ICD-10-CM | POA: Diagnosis not present

## 2019-03-03 DIAGNOSIS — R600 Localized edema: Secondary | ICD-10-CM | POA: Diagnosis not present

## 2019-03-03 LAB — URINALYSIS
BLOOD UA: NEGATIVE
GRANULAR CASTS: 1 /LPF — ABNORMAL HIGH
HYALINE CASTS: 3 /LPF — ABNORMAL HIGH (ref 0–1)
KETONES UA: NEGATIVE
LEUKOCYTE ESTERASE UA: NEGATIVE
NITRITE UA: NEGATIVE
PH UA: 5 (ref 5.0–9.0)
PROTEIN UA: NEGATIVE
RBC UA: <1 /HPF (ref <=3)
SPECIFIC GRAVITY UA: 1.009 (ref 1.003–1.030)
SQUAMOUS EPITHELIAL: <1 /HPF (ref 0–5)
UROBILINOGEN UA: 2 — AB
WBC UA: <1 /HPF (ref <=2)

## 2019-03-03 LAB — BLOOD GAS CRITICAL CARE PANEL, ARTERIAL
BASE EXCESS ARTERIAL: 17.2 — ABNORMAL HIGH (ref -2.0–2.0)
CALCIUM IONIZED ARTERIAL (MG/DL): 4.2 mg/dL — ABNORMAL LOW (ref 4.40–5.40)
GLUCOSE WHOLE BLOOD: 105 mg/dL (ref 70–179)
HCO3 ARTERIAL: 43 mmol/L — ABNORMAL HIGH (ref 22–27)
LACTATE BLOOD ARTERIAL: 0.8 mmol/L (ref <1.3)
O2 SATURATION ARTERIAL: 93.1 % — ABNORMAL LOW (ref 94.0–100.0)
PCO2 ARTERIAL: 70.2 mmHg (ref 35.0–45.0)
PH ARTERIAL: 7.4 (ref 7.35–7.45)
PO2 ARTERIAL: 67.6 mmHg — ABNORMAL LOW (ref 80.0–110.0)
POTASSIUM WHOLE BLOOD: 3.7 mmol/L (ref 3.4–4.6)
SODIUM WHOLE BLOOD: 135 mmol/L (ref 135–145)

## 2019-03-03 LAB — MANUAL DIFFERENTIAL
BASOPHILS - REL (DIFF): 0 %
EOSINOPHILS - ABS (DIFF): 0 10*9/L (ref 0.0–0.4)
EOSINOPHILS - REL (DIFF): 2 %
LYMPHOCYTES - ABS (DIFF): 0.1 10*9/L — ABNORMAL LOW (ref 1.5–5.0)
LYMPHOCYTES - REL (DIFF): 28 %
MONOCYTES - ABS (DIFF): 0.1 10*9/L — ABNORMAL LOW (ref 0.2–0.8)
MONOCYTES - REL (DIFF): 13 %
NEUTROPHILS - REL (DIFF): 57 %

## 2019-03-03 LAB — INR: Coagulation tissue factor induced.INR:RelTime:Pt:PPP:Qn:Coag: 1.45

## 2019-03-03 LAB — CBC W/ AUTO DIFF
HEMATOCRIT: 24.8 % — ABNORMAL LOW (ref 41.0–53.0)
HEMOGLOBIN: 7.8 g/dL — ABNORMAL LOW (ref 13.5–17.5)
MEAN CORPUSCULAR HEMOGLOBIN: 31 pg (ref 26.0–34.0)
MEAN CORPUSCULAR VOLUME: 98.3 fL (ref 80.0–100.0)
MEAN PLATELET VOLUME: 10.4 fL — ABNORMAL HIGH (ref 7.0–10.0)
NUCLEATED RED BLOOD CELLS: 21 /100{WBCs} — ABNORMAL HIGH (ref <=4)
RED BLOOD CELL COUNT: 2.53 10*12/L — ABNORMAL LOW (ref 4.50–5.90)
RED CELL DISTRIBUTION WIDTH: 17.4 % — ABNORMAL HIGH (ref 12.0–15.0)
WBC ADJUSTED: 0.5 10*9/L — ABNORMAL LOW (ref 4.5–11.0)

## 2019-03-03 LAB — PHOSPHORUS: Phosphate:MCnc:Pt:Ser/Plas:Qn:: 4.3

## 2019-03-03 LAB — COMPREHENSIVE METABOLIC PANEL
ALBUMIN: 2.7 g/dL — ABNORMAL LOW (ref 3.5–5.0)
ALT (SGPT): 143 U/L — ABNORMAL HIGH (ref <50)
AST (SGOT): 96 U/L — ABNORMAL HIGH (ref 19–55)
BILIRUBIN TOTAL: 2.4 mg/dL — ABNORMAL HIGH (ref 0.0–1.2)
BLOOD UREA NITROGEN: 32 mg/dL — ABNORMAL HIGH (ref 7–21)
CALCIUM: 7.9 mg/dL — ABNORMAL LOW (ref 8.5–10.2)
CHLORIDE: 90 mmol/L — ABNORMAL LOW (ref 98–107)
CO2: >40 mmol/L (ref 22.0–30.0)
CREATININE: 1 mg/dL (ref 0.70–1.30)
EGFR CKD-EPI AA MALE: >90 mL/min/{1.73_m2} (ref >=60)
EGFR CKD-EPI NON-AA MALE: 80 mL/min/{1.73_m2} (ref >=60)
GLUCOSE RANDOM: 81 mg/dL (ref 70–99)
POTASSIUM: 4 mmol/L (ref 3.5–5.0)
PROTEIN TOTAL: 5.8 g/dL — ABNORMAL LOW (ref 6.5–8.3)
SODIUM: 136 mmol/L (ref 135–145)

## 2019-03-03 LAB — MONOCYTES - REL (DIFF): Monocytes/100 leukocytes:NFr:Pt:Bld:Qn:Manual count: 13

## 2019-03-03 LAB — CALCIUM: Calcium:MCnc:Pt:Ser/Plas:Qn:: 7.9 — ABNORMAL LOW

## 2019-03-03 LAB — O2 SATURATION ARTERIAL: Oxygen saturation:MFr:Pt:BldA:Qn:: 93.1 — ABNORMAL LOW

## 2019-03-03 LAB — BLOOD UA: Hemoglobin:PrThr:Pt:Urine:Ord:Test strip: NEGATIVE

## 2019-03-03 LAB — HEPARIN CORRELATION: Lab: <0.2

## 2019-03-03 LAB — URIC ACID: Urate:MCnc:Pt:Ser/Plas:Qn:: 6.4

## 2019-03-03 LAB — D-DIMER QUANTITATIVE (CH,ML,PD,ET): Fibrin D-dimer DDU:MCnc:Pt:PPP:Qn:: 1412 — ABNORMAL HIGH

## 2019-03-03 LAB — MACROCYTES

## 2019-03-03 LAB — MAGNESIUM: Magnesium:MCnc:Pt:Ser/Plas:Qn:: 1.9

## 2019-03-03 LAB — LACTATE DEHYDROGENASE: Lactate dehydrogenase:CCnc:Pt:Ser/Plas:Qn:Reaction: pyruvate to lactate: 1488 — ABNORMAL HIGH

## 2019-03-03 LAB — PROTIME-INR: INR: 1.45

## 2019-03-03 NOTE — Unmapped (Signed)
Transfer Center Request Note    Date and Time: March 02, 2019 9:37 PM    Requesting Physician: Dr Velva Harman    Requesting [facility]: [facility]    Requesting Service: Malignant Hematology    Reason for Transfer: continuity of care in a complex patient    Patient been to [facility] before? 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 Presented to OSH with fatigue, weakness, and SOB. Hypoxic SpO2 80%, reports that he feels swollen. BNP 8000.     Evaluated for LLL PNA, hypoxia on HFNC flow 55 ml/hr at 55% (baseline of 2L-4L at home), seems more comfortable with the extra pressure, than with [place]. Currently being titrated down twice. Appears chronically ill.     Patient's caregiver had called earlier today for worsening dark urine, abdominal pain, increased fatigue. There is an increased in Tbili (2.1 ->1.9), AST (144 -> 196), ALT (159 -> 168), ALKP (161 -> 194).     VS: T  Afebrile  HR 80  BP 112/72  RR  20   SATS 96, 97%        LABS:  CBC: wbc 1.0,  Hb 8.2  Plt 54 ANC 440  Na  136   HCO  30 K 4.4  Cr 1.09   BUN  42   Cal 9.1 Albumin 2.6  No Phos, uric acid, LDH    ALKP 194, tbili 2.1, AST 109 ALT 168  Blood culture: 10/25 @ 1927  UA       Imaging :  CXR: LLL PNA    For far in the Ed he has received: no fluids, but thinking of getting Cefepime and Vanco    Checklist for admission to [facility] from outside ED:  MUST MEET THE FOLLOWING VITAL PARAMATERS:  HR >60 and <120 yes  SBP >90 and <180 yes  Respirations >8 and <25 yes  O2 sat >90% on < or = to 40% or stable home O2 requirement yes    SHOULD NOT HAVE ANY OF THE FOLLOWING (MUST ALL BE NO ANSWERS):  Concern for neurologic complications related to cancer or treatments no  Acute AMS no  Positive troponin: N/A   Concern for leukostasis yes  Severe sepsis/hemodynamic instability no  Differential includes sepsis or leukostasis yes  Supportive nursing interventions at least every 2 hours no Drips, titratable meds or meds requiring frequent monitoring no  Vitals/Assessments/Labs more than every 4 hours no  Undiagnosed malignancy no  Unclear which primary service would best serve the patient no    Impression: Mr Toral is known to our clinic as a patient with new diagnosis of TP53 AML recently on Aza/Ven. He appears acute ill with the problems listed below and warrants transfer for further management given his acuity.     Problem list  1. Pancytopenia  2. Worsening transaminitis   3. LLL PNA   4. Acute hypoxic respiratory distress     Plan Upon Arrival:   - admit to the floor with repeat CBC/d, BMP, LFTs,   - evaluate for liver dysfunction  - continue Antibiotics, re-evaluating need for Vancomycin   - rest of plan pending full assessment on arrival    Bed Type: Stepdown    Accepting Service: E1    Records of consults, imaging, pathology and discharge summary request to be faxed to [facility] fax [phone] : yes  Disk of imaging requested: no    Pathology slides requested:no      Fellow Triaging Request:  Jeraldine Loots, MD    The patient was discussed with Dr Lloyd Huger.

## 2019-03-03 NOTE — Unmapped (Signed)
Medicine History and Physical    Assessment/Plan:    Principal Problem:    Acute hypoxemic respiratory failure (CMS-HCC)  Active Problems:    Diabetes mellitus (CMS-HCC)    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 *    Rannie Makyi Ledo is a 63 y.o. man with a history of COPD and recently diagnosed AML with course complicated by treatment related pancytopenia and GI bleed here with AHRF and elevated LFTs.    Acute on Chronic Hypoxic Respiratory Failure: At baseline on 2-4L of supplemental oxygen at home. CXR with evidence of pulmonary edema and trace left pleural effusion. History of COPD having recently completed an extended steroid taper for exacerbation on prior admission.   - Differential includes PNA vs COPD exacerbation.  - Follow up Respiratory Pathogen Panel, SARS-CoV-2 PCR  - Continue treatment for CAP? +/- COPD exacerbation treatment?  - Prednisone 40 mg  - Cefepime    AMS/Left-sided weakness: Patient developed acute-onset left-sided weakness on admission. Evaluated by Neurology NIHSS 8 initially improving to 1. CTH negative.  - continuous EEG x24h to evaluate for any seizure activity    Elevated LFTs: Characterized by mild hyperbilirubinemia and transaminase elevations. TBili first noted to be elevated on 10/20, AST/ALT/AlkPhos elevation since 10/21. Could be related to azacitidine which can cause transient elevation of liver enzymes. Hep B/C and HIV were negative in 11/2017. Most recent abdominal imaging (CT) showed hepatosplenomegaly by no evidence of cirrhosis.   - Daily CMP  - Liver US w/ doppler, RUQ [place] ?  - If worsening cholestatic pattern consider GI Biliary consult for MRCP/ERCP AML: Follows with Dr. Vertell Limber. BMBx on 01/15/2019 was hypercellular (70%) with 30% blasts. He has complex cytogenetics and TP53 mutation. He was started on Azacitdine/Venetoclax, C1D1 = 10/6. Course complicated by pancytopenia and GI bleeding.  - Daily CBC w/ diff  - Maintain active Type and Screen  - Transfuse for Hgb < 7, Plts < 30  - Discuss continuation of current chemotherapy regimen while inpatient.    Chronic Medical Problems:  HTN: Holding amlodipine 10 mg daily  CAD: Stents last placed on 02/2016 with reported EF of 50%. Most recent heart cath on 01/2018 was unremarkable. Holding metoprolol, IMDUR, PRN diureses with furosemide depending on volume status.  COPD: Continue duonebs  Chronic Pain: Continue home pain regimen    Daily Checklist:  Access: PIV  Diet: Regular  Electrolytes: Replete PRN  DVT PPx: Lovenox   GI PPx: Not Indicated  Code Status: Full Code  Dispo: Stepdown, Med E1  ___________________________________________________________________    Chief Complaint:  Acute hypoxemic respiratory failure (CMS-HCC)    HPI:  Kyrian Stage is a 63 y.o. man with a history of COPD and recently diagnosed AML with course complicated by treatment related pancytopenia and GI bleed who presents as a transfer from [facility] for management of AHRF and elevated LFTs.    Patient presented from outside hospital with weakness, fatigue and shortness of breath. On presentation, patient had a rash across his chest back and neck along with swelling of his left eye with purulent drainage. Per patient's sister, the rash or eye symptoms were not present the night prior. Patient also noted to have a new left-sided facial droop, with LUE weakness of acute onset. BG: 60-70. Patient was evaluated by Neurology with initial concern for stroke and noted to be gradually improving. CTH was negative for acute pathology.  Labs notable for CBC: WBC: 0.5, Hgb: 7.8, PLT: 56. ANC: 0.3. LDH: 1488. AST: 96, ALT: 143, ALP: 164. In MPCU patient required oxygen by HFNC 60%. ABG: 7.36/78/59. CXR with pulmonary edema. Patient noted to have significant peripheral edema and given lasix.    Oncology History Overview Note   Diagnosis: AML with MDS-Related Changes    Presentation: Pancytopenia    Final Diagnosis   Date Value Ref Range Status   01/15/2019   Final    (Outside Case #:  [id number], dated 01/15/2019)  Bone marrow,  aspiration and biopsy  -  Hypercellular bone marrow (70%) involved by acute myeloid leukemia (30% blasts by manual aspirate differential) (See Comment)        This electronic signature is attestation that the pathologist personally reviewed the submitted material(s) and the final diagnosis reflects that evaluation.          Cytogenetics: Complex/Monosomal Karyotype with Del(5q), -17    Flow cytometry: CD7+, CD33+, CD34+, CD38+, CD117+, CD123+, HLA-DR+    Variants of Known or Likely Clinical Significance    Gene  Transcript   Predicted Protein   VAF (%)    TP53  c.659A>G  p.Tyr220Cys  52     Cycle 1: 02/11/19  Azacitidine 75mg /m2 x 7 days + venetoclax 100mg  daily (DR'd d/t DDI with posaconazole)     Acute myeloid leukemia not having achieved remission (CMS-HCC)   01/15/2019 Biopsy    Final Diagnosis   Date Value Ref Range Status   01/15/2019   Final    (Outside Case #:  [id number], dated 01/15/2019)  Bone marrow,  aspiration and biopsy  -  Hypercellular bone marrow (70%) involved by acute myeloid leukemia (30% blasts by manual aspirate differential) (See Comment)        This electronic signature is attestation that the pathologist personally reviewed the submitted material(s) and the final diagnosis reflects that evaluation.          Cytogenetics: Complex Karyotype with Del(5q), -17     01/23/2019 Initial Diagnosis    Acute myeloid leukemia not having achieved remission (CMS-HCC)     02/11/2019 -  Chemotherapy OP AML AZACITIDINE + VENETOCLAX  azacitidine 75 mg/m2 SQ on days 1-7, venetoclax ramp up week 1; dose dependent, then azacitidine 75 mg/m2 SQ on days 1-7 every 28 days.       Allergies:  Patient has no known allergies.    Medications:   Prior to Admission medications    Medication Dose, Route, Frequency   acetaminophen (TYLENOL) 650 MG CR tablet 650 mg, Oral, Every 8 hours PRN   albuterol HFA 90 mcg/actuation inhaler 2 puffs, Inhalation, Every 4 hours PRN  Patient not taking: Reported on 02/20/2019   aminocaproic acid (AMICAR) 500 mg tablet 1,000 mg, Oral, 3 times a day (standard)   amLODIPine (NORVASC) 5 MG tablet 5 mg, Oral, Daily (standard)   citalopram (CELEXA) 20 MG tablet 20 mg, Oral, Daily (standard)   diclofenac sodium (VOLTAREN) 1 % gel 2 g, Topical   dicyclomine (BENTYL) 10 mg capsule 10 mg, Oral, 2 times a day (standard)   docusate sodium (COLACE) 100 MG capsule 100 mg, Oral, Daily (standard)  Patient not taking: Reported on 02/20/2019   FLECTOR 1.3 % PT12 1 patch, Transdermal, Daily (standard)   furosemide (LASIX) 40 MG tablet 40 mg, Oral, Daily (standard)   isosorbide mononitrate (IMDUR) 30 MG 24 hr tablet 30 mg, Oral, Daily (standard)   levoFLOXacin (LEVAQUIN) 500 MG tablet 500 mg, Oral, Daily (standard)  loperamide (IMODIUM A-D) 2 mg tablet 2 mg, Oral, 4 times daily PRN   metoprolol succinate (TOPROL-XL) 25 MG 24 hr tablet 25 mg, Oral, Daily (standard)   nitroglycerin (NITROSTAT) 0.4 MG SL tablet 0.4 mg, Sublingual, Every 5 min PRN  Patient not taking: Reported on 02/20/2019   nystatin (MYCOSTATIN) 100,000 unit/mL suspension 500,000 Units, Oral, 4 times a day, Swish and spit   ondansetron (ZOFRAN) 4 MG tablet 4 mg, Oral, Every 8 hours PRN  Patient not taking: Reported on 02/20/2019   pantoprazole (PROTONIX) 40 MG tablet 40 mg, Oral, 2 times a day (standard)   posaconazole (NOXAFIL) 100 mg TbEC delayed released tablet Take 3 tablets (300 mg) by mouth daily. senna (SENNA LAX) 8.6 mg tablet 2 tablets, Oral, Nightly   SYMBICORT 160-4.5 mcg/actuation inhaler INHALE TWO PUFFS BY MOUTH TWICE DAILY   traMADoL (ULTRAM) 50 mg tablet 50 mg, Oral, Every 8 hours PRN  Patient not taking: Reported on 02/20/2019   valACYclovir (VALTREX) 500 MG tablet 500 mg, Oral, Daily (standard)   venetoclax (VENCLEXTA) 100 mg tablet Take 1 tablet (100 mg total) by mouth daily with a meal and water. Do not chew, crush, or break tablets.  Patient not taking: Reported on 02/20/2019     Medical History:  Past Medical History:   Diagnosis Date   ??? Angina pectoris (CMS-HCC)    ??? Anxiety    ??? Arthritis     bilateral knees   ??? CAD (coronary artery disease)    ??? Depression    ??? Dyslipidemia    ??? HL (hearing loss)    ??? Hyperlipidemia    ??? Hypertension    ??? Medically noncompliant    ??? Mental retardation    ??? MI (myocardial infarction) (CMS-HCC)    ??? Migraines    ??? Sleep apnea     USES CPAP OCCASIONALLY   ??? Thrombocytopenia (CMS-HCC) 01/20/2019     Surgical History:  Past Surgical History:   Procedure Laterality Date   ??? APPENDECTOMY     ??? CARDIAC CATHETERIZATION     ??? CHOLECYSTECTOMY     ??? CORONARY STENT PLACEMENT     ??? HEMORRHOID SURGERY     ??? PR CATH PLACE/CORON ANGIO, IMG SUPER/INTERP,W LEFT HEART VENTRICULOGRAPHY N/A 02/25/2016    Procedure: Left Heart Catheterization W Intervention;  Surgeon: Job Founds, MD;  Location: [facility] CATH;  Service: Cardiology   ??? PR KNEE SCOPE,PART SYNOVECT Right 04/21/2015    Procedure: ARTHROSCOPY, KNEE, SURGICAL; SYNOVECTOMY LIMITED (EG, PLICA OR SHELF RESECTION) (SEPARATE PROCEDURE);  Surgeon: Annice Needy, MD;  [facility];  Service: Orthopedics   ??? PR KNEE SCOPE,SINGLE MENISECTOMY Right 04/21/2015    Procedure: ARTHROSCOPY, KNEE; W/MENISECTMY/CHONDROPLASTY/SYNOVECTOMY;  Surgeon: Annice Needy, MD;  Location: [name] [facility];  Service: Orthopedics   ??? PR XCAPSL CTRC RMVL INSJ IO LENS PROSTH W/O ECP Right 03/12/2017 Procedure: CATARACT RIGHT EYE;  Surgeon: Gwynneth Aliment, MD;  Location: [name] [facility];  Service: Ophthalmology   ??? PR XCAPSL CTRC RMVL INSJ IO LENS PROSTH W/O ECP Left 05/28/2017    Procedure: CATARACT LEFT EYE;  Surgeon: Gwynneth Aliment, MD;  Location: [name] [facility];  Service: Ophthalmology   ??? SKIN BIOPSY     ??? TONSILLECTOMY       Social History:  Social History     Socioeconomic History   ??? Marital status: Single     Spouse name: Not on file   ??? Number of children: Not on  file   ??? Years of education: Not on file   ??? Highest education level: Not on file   Occupational History   ??? Not on file   Social Needs   ??? Financial resource strain: Not on file   ??? Food insecurity     Worry: Never true     Inability: Never true   ??? Transportation needs     Medical: Not on file     Non-medical: Not on file   Tobacco Use   ??? Smoking status: Former Smoker     Packs/day: 1.00     Years: 5.00     Pack years: 5.00     Types: Cigarettes     Quit date: 04/11/2014     Years since quitting: 4.8   ??? Smokeless tobacco: Never Used   Substance and Sexual Activity   ??? Alcohol use: No     Alcohol/week: 0.0 standard drinks   ??? Drug use: No   ??? Sexual activity: Not Currently     Partners: Female   Lifestyle   ??? Physical activity     Days per week: Not on file     Minutes per session: Not on file   ??? Stress: Not on file   Relationships   ??? Social Wellsite geologist on phone: Not on file     Gets together: Not on file     Attends religious service: Not on file     Active member of club or organization: Not on file     Attends meetings of clubs or organizations: Not on file     Relationship status: Not on file   Other Topics Concern   ??? Do you use sunscreen? No   ??? Tanning bed use? No   ??? Are you easily burned? No   ??? Excessive sun exposure? No   ??? Blistering sunburns? No   Social History Narrative   ??? Not on file     Family History:  Family History   Problem Relation Age of Onset   ??? Heart attack Father    ??? Melanoma Neg Hx ??? Basal cell carcinoma Neg Hx    ??? Squamous cell carcinoma Neg Hx      Review of Systems:  10 systems reviewed and are negative unless otherwise mentioned in HPI    Labs/Studies:  Labs and Studies from the last 24hrs per EMR and Reviewed    Physical Exam:  Temp:  [37.3 ??C-39.4 ??C] 38 ??C  Heart Rate:  [84-92] 88  SpO2 Pulse:  [85-94] 88  Resp:  [17-27] 24  BP: (101-143)/(30-62) 139/49  FiO2 (%):  [50 %-100 %] 50 %  SpO2:  [90 %-97 %] 94 %    General: Chronically ill-appearing male, somnolent but easily arousable.  HEENT: Head: normocephalic, atraumatic. Left eyelid swollen, eye with purulent drainage. Mouth: moist mucous membranes, no erythema, no lesions. Neck: supple, no thyromegaly, no JVD, no carotid bruit.  Lymphatics: No adenopathy.  Chest/Lungs: Increased work of breathing, symmetric air entry, faint rhonchi on auscultation.  Heart: regular rate rhythm, normal S1, S2, no murmurs, rubs, clicks or gallops  Abdomen: soft, nontender, distended, no masses or organomegaly appreciated.  Musculoskeletal: no joint tenderness, or deformity  Extremities: peripheral pulses normal, bilateral LE edema 3+ pitting to knee, no clubbing or cyanosis  Skin: erythematous urticarial rash localized to chest, back, abdomen. Multiple psoriatic lesions, various stages of healing.  Neurological: Left sided weakness 4/5 strength LUE, mild pronator  drift. Left-sided facial droop.  Psychiatric: Appears at baseline, pt with developmental delay

## 2019-03-03 NOTE — Unmapped (Signed)
Initial Consult Note        Requesting Attending Physician:  Windell Norfolk Zeidner,*  Service Requesting Consult: Oncology/Hematology (MDE)     Assessment and Plan          Raymond [name] is a 63 y.o.  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    Left Sided Weakness:  The patient initially presented with reported left gaze preference, left facial droop and LUE weakness with an NIHSS of 8. His thrombocytopenia immediately excluded him from tPA. Upon neurology evaluation, could not appreciate previously identified left facial droop or gaze preference on exam, though did note some mild left pronator drift without any sensory deficits or aphasia, which gives him a NIHSS of 1, thus making a large vessel occlusion unlikely. Another possibility, given the quick resolution of symptoms and gaze preference towards the side of weakness, could be related to intraictal and then post-epileptic activity (Todd's phenomenon). The leading etiology being a possible intracranial hemorrhage acting as a seizure focus given his thrombocytopenia.     RECOMMENDATIONS:  1. STAT CT Head Wo Contrast to evaluate for hemorrhage  2. continuous EEG x24h to evaluate for any seizure activity or possible focus. If focal abnormalities present, will likely recommend additional imaging.    This patient was seen and discussed with Dr. Raenette Rover who agrees with the above assessment and plan.      Lyla Son, MD  PGY-2, Neurology       ATTESTATION NOTE:  I saw and evaluated the patient, participating in the key portions of the service.  I reviewed the resident???s note and agree with the resident???s findings and plan as documented in their note.  Tawanna Cooler, MD        HPI        Reason for Consult: BAT Code Germaine [name] is a 63 y.o.male with a PMH of COPD, developmental delay, AML with course complicated by treatment related pancytopenia and GI bleed, on whom I have been asked by Avie Arenas,* to consult for Left facial droop, Left gaze preference, LUE weakness.     The patient was recently transferred to [facility] (03/03/19 @1300 ) for management of acute hypoxic respiratory failure, in the setting of treatment related pancytopenia given that his AML is managed at [facility]. The patient's nurse noticed a left facial droop around 1445. A RRT was called which and soon after a BAT Code was called as well. LKN was around 1425. The patient was initially given a NIHSS 8 for left gaze preference, facial droop and left sided weakness. Upon my exam, did not appreciate significant deficits in cranial nerves. Repeat NIHSS 1. There still did appear to be some left pronator drift. Patient initially resisted cooperating with exam and refused to obtain [facility] saying he was fine, but eventually complied.    No Known Allergies     Current Facility-Administered Medications   Medication Dose Route Frequency Provider Last Rate Last Dose   ??? citalopram (CeleXA) tablet 20 mg  20 mg Oral Daily Harlow Asa, MD   20 mg at 03/03/19 1550   ??? dextrose 50 % solution            ??? fluticasone furoate-vilanteroL (BREO ELLIPTA) 100-25 mcg/dose inhaler 1 puff  1 puff Inhalation Daily (RT) Harlow Asa, MD       ???  hydrocortisone 1 % cream   Topical BID Harlow Asa, MD       ??? ipratropium-albuteroL (DUO-NEB) 0.5-2.5 mg/3 mL nebulizer solution 3 mL  3 mL Nebulization Q6H (RT) Harlow Asa, MD       ??? levoFLOXacin (LEVAQUIN) tablet 500 mg  500 mg Oral Daily Harlow Asa, MD   500 mg at 03/03/19 1747   ??? ondansetron (ZOFRAN) tablet 4 mg  4 mg Oral Q8H PRN Harlow Asa, MD       ??? pantoprazole (PROTONIX) EC tablet 40 mg  40 mg Oral BID Harlow Asa, MD ??? posaconazole (NOXAFIL) delayed released tablet 300 mg  300 mg Oral Daily Harlow Asa, MD   300 mg at 03/03/19 1550   ??? predniSONE (DELTASONE) tablet 40 mg  40 mg Oral Daily Harlow Asa, MD   40 mg at 03/03/19 1747   ??? senna (SENOKOT) tablet 2 tablet  2 tablet Oral Nightly Harlow Asa, MD       ??? valACYclovir (VALTREX) tablet 500 mg  500 mg Oral Daily Harlow Asa, MD   500 mg at 03/03/19 1550       Past Medical History:   Diagnosis Date   ??? Angina pectoris (CMS-HCC)    ??? Anxiety    ??? Arthritis     bilateral knees   ??? CAD (coronary artery disease)    ??? Depression    ??? Dyslipidemia    ??? HL (hearing loss)    ??? Hyperlipidemia    ??? Hypertension    ??? Medically noncompliant    ??? Mental retardation    ??? MI (myocardial infarction) (CMS-HCC)    ??? Migraines    ??? Sleep apnea     USES CPAP OCCASIONALLY   ??? Thrombocytopenia (CMS-HCC) 01/20/2019       Past Surgical History:   Procedure Laterality Date   ??? APPENDECTOMY     ??? CARDIAC CATHETERIZATION     ??? CHOLECYSTECTOMY     ??? CORONARY STENT PLACEMENT     ??? HEMORRHOID SURGERY     ??? PR CATH PLACE/CORON ANGIO, IMG SUPER/INTERP,W LEFT HEART VENTRICULOGRAPHY N/A 02/25/2016    Procedure: Left Heart Catheterization W Intervention;  Surgeon: Job Founds, MD;  Location: [facility] CATH;  Service: Cardiology   ??? PR KNEE SCOPE,PART SYNOVECT Right 04/21/2015    Procedure: ARTHROSCOPY, KNEE, SURGICAL; SYNOVECTOMY LIMITED (EG, PLICA OR SHELF RESECTION) (SEPARATE PROCEDURE);  Surgeon: Annice Needy, MD;  [facility];  Service: Orthopedics   ??? PR KNEE SCOPE,SINGLE MENISECTOMY Right 04/21/2015    Procedure: ARTHROSCOPY, KNEE; W/MENISECTMY/CHONDROPLASTY/SYNOVECTOMY;  Surgeon: Annice Needy, MD;  Location: [name];  Service: Orthopedics   ??? PR XCAPSL CTRC RMVL INSJ IO LENS PROSTH W/O ECP Right 03/12/2017 Procedure: CATARACT RIGHT EYE;  Surgeon: Gwynneth Aliment, MD;  Location: [name];  Service: Ophthalmology   ??? PR XCAPSL CTRC RMVL INSJ IO LENS PROSTH W/O ECP Left 05/28/2017    Procedure: CATARACT LEFT EYE;  Surgeon: Gwynneth Aliment, MD;  Location: [name];  Service: Ophthalmology   ??? SKIN BIOPSY     ??? TONSILLECTOMY         Social History     Socioeconomic History   ??? Marital status: Single     Spouse name: Not on file   ??? Number of children: Not on file   ??? Years of education: Not on file   ??? Highest education level:  Not on file   Occupational History   ??? Not on file   Social Needs   ??? Financial resource strain: Not on file   ??? Food insecurity     Worry: Never true     Inability: Never true   ??? Transportation needs     Medical: Not on file     Non-medical: Not on file   Tobacco Use   ??? Smoking status: Former Smoker     Packs/day: 1.00     Years: 5.00     Pack years: 5.00     Types: Cigarettes     Quit date: 04/11/2014     Years since quitting: 4.8   ??? Smokeless tobacco: Never Used   Substance and Sexual Activity   ??? Alcohol use: No     Alcohol/week: 0.0 standard drinks   ??? Drug use: No   ??? Sexual activity: Not Currently     Partners: Female   Lifestyle   ??? Physical activity     Days per week: Not on file     Minutes per session: Not on file   ??? Stress: Not on file   Relationships   ??? Social Wellsite geologist on phone: Not on file     Gets together: Not on file     Attends religious service: Not on file     Active member of club or organization: Not on file     Attends meetings of clubs or organizations: Not on file     Relationship status: Not on file   Other Topics Concern   ??? Do you use sunscreen? No   ??? Tanning bed use? No   ??? Are you easily burned? No   ??? Excessive sun exposure? No   ??? Blistering sunburns? No   Social History Narrative   ??? Not on file       Family History   Problem Relation Age of Onset   ??? Heart attack Father    ??? Melanoma Neg Hx ??? Basal cell carcinoma Neg Hx    ??? Squamous cell carcinoma Neg Hx        Code Status: Full Code     Review of Systems     A 10-system review of systems was conducted and was negative except as documented above in the HPI.       Objective        Temp:  [37.3 ??C-37.8 ??C] 37.8 ??C  Heart Rate:  [84-92] 91  SpO2 Pulse:  [85-94] 91  Resp:  [17-27] 25  BP: (101-143)/(30-62) 143/52  MAP (mmHg):  [55-78] 78  FiO2 (%):  [50 %-100 %] 50 %  SpO2:  [90 %-97 %] 94 %  BMI (Calculated):  [34.66] 34.66  I/O this shift:  In: 120 [P.O.:120]  Out: 750 [Urine:750]    Physical Exam:  General Appearance:Overweight.  HEENT: Head is atraumatic and normocephalic. Yellowish drainage from Left eye. Oropharyngeal membranes are dry.  Neck: Deferred.  Lungs: increased work of breathing   Heart: Regular rate and rhythm.  Abdomen: Nondistended.  Extremities: No clubbing or cyanosis.    Neurological Examination:     Mental Status: Attention and concentration grossly intact to interview and physical.    Cranial Nerves: PERRL 3 mm. Pursuit eye movements were uninterrupted with full range and without more than end-gaze nystagmus. Facial sensation intact bilaterally to light touch on the forehead, cheek, and chin. Face symmetric at rest. Normal facial movement bilaterally, including  forehead, eye closure and grimace/smile. Hearing intact to conversation. Shoulder shrug full strength bilaterally. Palate movement is symmetric. Tongue protrudes midline and tongue movements are normal.    Motor Exam: Normal bulk.  No tremors, myoclonus, or other adventitious movement.  Pronator drift present on the Left.  UE R/L: biceps 5/4+, triceps 5/4+ and hand grip strong/mildly weak.  LE R/L: hip flexion 3/3, quadriceps 3/3, hamstrings 3/3, dorsiflexion 5/5 and plantar flexion 5/5.    Reflexes: deferred    Sensory: grossly intact to LT and temp.    Cerebellar/Coordination/Gait: Finger-to-nose is normal without ataxia or dysmetria bilaterally.      NIHSS: (1a.) Level of Consciousness:  0 = Alert; keenly responsive   (1b.) LOC Questions:  0 = Answers both questions correctly   (1c.) LOC Commands:  0 = Performs both tasks correctly   (2.)   Best Gaze:  0 = Normal   (3.)   Visual:  0 = No visual loss   (4.)   Facial Palsy:  0 = Normal symmetric movement   (5a.) Motor Arm, Left:  1 = Drift, limb holds 90 (or 45) degrees, but drifts down before full 10 seconds; does not hit bed   (5b.) Motor Arm, Right:  0 = No drift, limb holds 90 (or 45) degrees for full 10 seconds   (6a.) Motor Leg, Left:  0 = No drift, limb holds 90 (or 45) degrees for full 5 seconds   (6b.) Motor Leg, Right:  0 = No drift, limb holds 90 (or 45) degrees for full 5 seconds   (7.)   Limb Ataxia:  0 = Absent   (8.)   Sensory:  0 = Normal; no sensory loss   (9.)   Best Language:  0 = No aphasia, normal   (10.) Dysarthria:  0 = Normal   (11.) Extinction and Inattention:  0 = No abnormality   NIHSS Total Score:  1         ABCD2 TIA Assessment Scale:  Age 59 or above?  YES  +1   SBP > 140 mmHg or DBP > 90 mmHg at Initial Evaluation?  NO   Clinical Features of the TIA:  ??  Unilateral weakness with or without speech impairment?   ??  Speech impairment ONLY without unilateral weakness?     YES  +2  NO   Duration of Symptoms:  ??  ? 60 minutes?   ??  10-59 minutes?     NO  YES  +1   Diabetes Mellitus in the Patient's History?  NO   ABCD2 Total Score:  4                Diagnostic Studies      All Labs Last 24hrs:   Recent Results (from the past 24 hour(s))   APTT    Collection Time: 03/03/19  1:40 PM   Result Value Ref Range    APTT 20.7 (L) 25.3 - 37.1 sec    Heparin Correlation <0.2    Comprehensive Metabolic Panel    Collection Time: 03/03/19  1:40 PM   Result Value Ref Range    Sodium 136 135 - 145 mmol/L    Potassium 4.0 3.5 - 5.0 mmol/L    Chloride 90 (L) 98 - 107 mmol/L    Anion Gap      CO2 >40.0 (HH) 22.0 - 30.0 mmol/L    BUN 32 (H) 7 - 21 mg/dL    Creatinine 9.62 9.52 -  1.30 mg/dL BUN/Creatinine Ratio 32     EGFR CKD-EPI Non-African American, Male 80 >=60 mL/min/1.87m2    EGFR CKD-EPI African American, Male >90 >=60 mL/min/1.56m2    Glucose 81 70 - 99 mg/dL    Calcium 7.9 (L) 8.5 - 10.2 mg/dL    Albumin 2.7 (L) 3.5 - 5.0 g/dL    Total Protein 5.8 (L) 6.5 - 8.3 g/dL    Total Bilirubin 2.4 (H) 0.0 - 1.2 mg/dL    AST 96 (H) 19 - 55 U/L    ALT 143 (H) <50 U/L    Alkaline Phosphatase 164 (H) 38 - 126 U/L   D-Dimer, Quantitative    Collection Time: 03/03/19  1:40 PM   Result Value Ref Range    D-Dimer 1,412 (H) <230 ng/mL DDU   Lactate dehydrogenase    Collection Time: 03/03/19  1:40 PM   Result Value Ref Range    LDH 1,488 (H) 338 - 610 U/L   Magnesium Level    Collection Time: 03/03/19  1:40 PM   Result Value Ref Range    Magnesium 1.9 1.6 - 2.2 mg/dL   Phosphorus Level    Collection Time: 03/03/19  1:40 PM   Result Value Ref Range    Phosphorus 4.3 2.9 - 4.7 mg/dL   PT-INR    Collection Time: 03/03/19  1:40 PM   Result Value Ref Range    PT 16.8 (H) 10.2 - 13.1 sec    INR 1.45    Uric acid    Collection Time: 03/03/19  1:40 PM   Result Value Ref Range    Uric Acid 6.4 4.0 - 9.0 mg/dL   Type and Screen    Collection Time: 03/03/19  1:40 PM   Result Value Ref Range    ABO Grouping A POS     Antibody Screen NEG    CBC w/ Differential    Collection Time: 03/03/19  1:40 PM   Result Value Ref Range    WBC 0.5 (L) 4.5 - 11.0 10*9/L    RBC 2.53 (L) 4.50 - 5.90 10*12/L    HGB 7.8 (L) 13.5 - 17.5 g/dL    HCT 16.1 (L) 09.6 - 53.0 %    MCV 98.3 80.0 - 100.0 fL    MCH 31.0 26.0 - 34.0 pg    MCHC 31.6 31.0 - 37.0 g/dL    RDW 04.5 (H) 40.9 - 15.0 %    MPV 10.4 (H) 7.0 - 10.0 fL    Platelet 56 (L) 150 - 440 10*9/L    nRBC 21 (H) <=4 /100 WBCs    Neutrophil Left Shift 1+ (A) Not Present    Macrocytosis Moderate (A) Not Present    Anisocytosis Slight (A) Not Present    Hypochromasia Marked (A) Not Present   Manual Differential    Collection Time: 03/03/19  1:40 PM   Result Value Ref Range    Neutrophils % 57 % Lymphocytes % 28 %    Monocytes % 13 %    Eosinophils % 2 %    Basophils % 0 %    Absolute Neutrophils 0.3 (LL) 2.0 - 7.5 10*9/L    Absolute Lymphocytes 0.1 (L) 1.5 - 5.0 10*9/L    Absolute Monocytes 0.1 (L) 0.2 - 0.8 10*9/L    Absolute Eosinophils 0.0 0.0 - 0.4 10*9/L    Absolute Basophils 0.0 0.0 - 0.1 10*9/L    Smear Review Comments See Comment (A) Undefined    Schistocytes Rare (A) Not  Present    Toxic Vacuolation Present (A) Not Present    Poikilocytosis Moderate (A) Not Present   POCT Glucose    Collection Time: 03/03/19  2:39 PM   Result Value Ref Range    Glucose, POC 71 70 - 179 mg/dL   Carboxyhemoglobin/POC    Collection Time: 03/03/19  2:43 PM   Result Value Ref Range    Carboxyhemoglobin 1.3 (H) <1.2 %   GLUCOSE-BG/POC    Collection Time: 03/03/19  2:43 PM   Result Value Ref Range    Glucose Whole Blood 85 70 - 179 mg/dL   POCT Arterial Blood Gas    Collection Time: 03/03/19  2:43 PM   Result Value Ref Range    pH, Arterial 7.36 7.35 - 7.45    pCO2, Arterial 78 (HH) 35 - 45 mm[Hg]    pO2, Arterial 59 (L) 80 - 110 mm[Hg]    HCO3 (Bicarbonate), Arterial 43.3 (H) 22.0 - 27.0 mmol/L    Base Excess, Arterial 17.8 (H) -2.0 - 2.0 mmol/L    O2 Sat, Arterial 88.9 (L) 94.0 - 100.0 %    Specimen Type, Arterial Arterial     FIO2 53 %   HEMOGLOBIN BG/POC    Collection Time: 03/03/19  2:43 PM   Result Value Ref Range    Hemoglobin 7.4 (L) 13.5 - 17.5 g/dL   POCT Ionized Calcium    Collection Time: 03/03/19  2:43 PM   Result Value Ref Range    Ionized Calcium 4.5 4.4 - 5.4 mg/dL   POTASSIUM-BG/POC    Collection Time: 03/03/19  2:43 PM   Result Value Ref Range    Potassium, Bld 4.0 3.4 - 4.6 mmol/L   POCT Lactic Acid (Lactate), Arterial    Collection Time: 03/03/19  2:43 PM   Result Value Ref Range    Lactate, Arterial 0.5 <1.3 mmol/L   METHEMOGLOBIN/POC    Collection Time: 03/03/19  2:43 PM   Result Value Ref Range    Methemoglobin <1.0 <1.5 %   SODIUM-BG/POC    Collection Time: 03/03/19  2:43 PM Result Value Ref Range    Sodium Whole Blood 141 135 - 145 mmol/L   POCT Arterial Oxyhemoglobin    Collection Time: 03/03/19  2:43 PM   Result Value Ref Range    Oxyhemoglobin 87.0 (L) 94.0 - 100.0 %       [facility], 03/03/19, personally reviewed:  No acute hemorrhage

## 2019-03-03 NOTE — Unmapped (Signed)
[facility] Consult Services                                                                 Wound Evaluation     Reason for Consult:   - Initial  - Wound    Problem List:   Principal Problem:    Acute hypoxemic respiratory failure (CMS-HCC)  Active Problems:    Diabetes mellitus (CMS-HCC)    Acute myeloid leukemia not having achieved remission (CMS-HCC)    Pneumonia    Assessment: Per EMR- Mr. 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 He is currently day 15 of Aza/Ven. He continues to have grade 4 cytopenias and circulating blasts. Pancytopenia secondary to AML. He p/w diarrhea after taking a laxative.     We were asked to evaluate wounds on the right shoulder that were present on admission.     Patient has two wounded areas of unknown etiology on his right shoulder. The skin around both has blistered away, the central portion is covered with eschar. There is no erythema, no calor or palor.     No interventions required at this time. Continue to monitor for changes such as increased erythema, drainage.     If there is concern may want to consult Dermatology.          03/03/19 1700   Wound 03/03/19 Other (comment) Back Right;Upper;Other (Comment)   Date First Assessed/Time First Assessed: 03/03/19 1402   Primary Wound Type: Other (comment)  Location: Back  Wound Location Orientation: (c) Right;Upper;Other (Comment)  LIP Notified Of Pressure Injury: Yes  Name of LIP: Boye-doe   Dressing Status      No dressing   Wound Length (cm) 2.5 cm   Wound Width (cm) 1 cm   Wound Depth (cm)   (depth indeterminable)   Wound Surface Area (cm^2) 2.5 cm^2   Wound Bed Black   Peri-wound Assessment        (Flaky)   Exudate Amnt      None   Dressing Open to air       Continence Status:   Incontinence of bladder: Foley in place    Moisture Associated Skin Damage: - None noted.     Lab Results   Component Value Date    WBC 0.4 (LL) 02/28/2019    HGB 8.5 (L) 02/28/2019    HCT 26.1 (L) 02/28/2019    ESR 61 (H) 08/29/2015    A1C 6.3 (H) 09/19/2012    GLU 148 02/28/2019    POCGLU 158 02/14/2019    ALBUMIN 2.9 (L) 02/28/2019    PROT 5.8 (L) 02/28/2019     Lower Extremity Distal Pulses:   - not assesed       Support Surface:   - Low Air Loss - ICU    Offloading: Heels off bed  Left: Pillow  Right: Pillow    Type Debridement Completed By [facility]OC[facility]:  N/A    Teaching:  - No teaching. Patient very sedate.    [facility] Recommendations:   - Contact [facility] with questions, concerns, or wound deterioration.    Topical Therapy/Interventions:   - No interventions required    Recommended Consults:  - Not  Applicable    [facility] Follow Up:  - We will sign off at this time. Please reconsult the [facility] Consult Service if you would like [place] to participate further in the care of this patient.     Plan of Care Discussed With:   - RN Fidela Juneau    Supplies Ordered: No    Workup Time:   30 minutes     Jeanelle Malling RN BS [facility]  (Pager)- [phone]  (Office)- [phone]

## 2019-03-03 NOTE — Unmapped (Addendum)
Raymond [name]??is a 63 y.o.??man with a history of COPD and recently diagnosed AML with course complicated by treatment related pancytopenia and GI bleed who presented as a transfer from [facility] for management of AHRF and elevated LFTs, found to also have neutropenic fevers on arrival. Acute on Chronic Hypoxic Respiratory Failure - PNA - Volume Overload:  AHRF likely multifactorial from multifocal pneumonia, COPD, and volume overload.  Per sister presented with 1 day of dyspnea, stopped wearing his home O2, and no pulse ox at home. At baseline on 2L of supplemental oxygen at home. Found to be hypoxic at OSH requiring HFNC.??CXR??with evidence of pulmonary edema and trace left pleural effusion.??Evening of transfer developed fever to 38.C raising concern for PNA. History of COPD having recently completed an extended steroid taper for exacerbation on prior admission and has poor airflow on exam. Also with 3+ pitting edema of the lower extremities suggesting component of volume overload and pro-BNP of 4240 compared to 88-100 on prior labs. Echo this admission showed newly reduced EF to Respiratory Pathogen Panel, SARS-CoV-2 PCR negative. CT Chest Noncon (10/26) w/ multifocal nodular and consolidative opacities, compatible with bronchopneumonia of possible viral etiology. Had intermittent returns to home O2 requirements during admission with diuresis, antibiotics, prednisone and nebulizer treatments, however followed by sudden increase in O2 requirements with periodic prn bipap use and bipap use at night in setting of chronic respiratory acidosis. Fluid restriction started due to excess PO fluid intake to maintain goal of net -1L. Again febrile (10/31), with repeat CT chest w/o contrast notable for interval improvement of patchy bilateral peribronchial nodular and GGO. Weaned to 3l [place] (11/1). Treated for HAP with cefepime 10/26 - 10/29 followed by zosyn 10/29 - 11/4). Initially planned for 1 week course to complete on 11/2 but extended due to fevers as below. It was decided to discharge patient on augmentin, while on hospice. Neutropenic Fevers - Most likely due to PNA initially, fevered to 39.4 on 10/26 then slowly defervesced throughout day. Treated for HAP as above with cefepime followed by zosyn. Blood cultures from OSH were NG x 5 days and repeat cultures at [facility] were NG on two separate occasions (10/26, 10/31).  and had largely been afebrile until evening 10/30 he began to have recurrent evening fevers daily in 38 -38.5 C range with other vitals stable.  UA unremarkable, urine culture 10/31 without growth, repeat blood cultures without growth. Zosyn course was extended and transitioned to augmentin. Considered intraabdominal pathology given pt's history of recurrent diarrhea with alternating constipation. Bowel regimen held on 11/1 and C diff test performed on 11/3 is still pending.      Afib w/ RVR - Pt with afib on 10/29 with rates as high as 170s-180s. No previous documentation of afib, was on metoprolol at home but may have been for CAD rather than for afib. Received 2 doses metoprolol 5 mg and 1 dose IV lasix 40 and converted to NSR within about an hour. Suspect afib was instigated by volume overload. Pt was restarted on his home metoprolol in divided doses (metoprolol tartrate 12.5 mg BID in place of home metoprolol succinate 25 mg daily). Consolidated back to home regimen on 11/3.  ??   Upper extremity tremor - S/p stroke w/u for L facial droop Patient developed acute-onset left-sided facial droop and LUE weakness on day of admission 10/27. Evaluated by Neurology NIHSS 8 initially, CTH negative. 24 hours of video EEG completed and without focal seziures, only showed difuse slowing. Neurology signed  off. Droop largely resolved.. Mental status seems at baseline (hx of intellectual disability). However has had pronounced upper extremity tremors new on this admission Elevated LFTs:??Characterized by mild hyperbilirubinemia and transaminase elevations.TBili first noted to be elevated on 10/20, AST/ALT/AlkPhos elevation since 10/21.??Likely drug induced. Could be related to azacitidine which can cause transient elevation of liver enzymes. Also discharged on amicare which may have exacerbated transaminitis. Hep B/C and HIV were negative in 11/2017. Most recent abdominal imaging (CT) showed hepatosplenomegaly but no evidence of cirrhosis. RUQ [place] without biliary duct dilation, showed mild hepatomegaly, s/p cholecystectomy.   ??  Ileus: Dilated loops of bowel on early admission KUB concerning for partial SBO vs ileus after transient report of abdominal pain. Pt with history of going through cycles of constipation followed by diarrhea. Sister reports he did have one dark (chronic melena) BM on 10/25 prior to presentation to hospital. Often in house has frequent BMs but with straining so we continued aggressive bowel regimen with miralax TID and senna BID. Held bowel regimen on 11/1 for diarrhea with plan to test for C diff on 11/3 is still pending.  Repeat KUB on 11/2 was without dilated bowels or large stool burden.  ??  Diffuse Rash - Facial edema (resolving)- suspect mild drug allergy to vanc vs. amicar. Swelling and itching improved with topical steroid cream, prednisone for COPD exacerbation, and 1x benadryl. Rash still present with diffuse erythematous macules on trunk but improving. Circumscribed lesions on back likely psoriatic given hx of psoriasis per chart review.    L eye swelling/drainage (Stable)- Purulent drainage on admission, was treated with polymixin eye drops for bacterial conjunctivitis for 1 week course from 10/26 - 11/2 with improvement. Adverse Risk AML: Follows with Dr. Vertell Limber. BMBx on 01/15/2019 was hypercellular (70%) with 30% blasts. He has complex cytogenetics and TP53 mutation. He was started on Azacitdine/Venetoclax, C1D1 = 10/6. Course complicated by pancytopenia and GI bleeding. He has a poor prognosis with and without chemo and has many underlying comorbidities. Began to discuss pt's goals of care with pt and sister- pt with poor understanding of risks vs. Benefits of chemo and only short term outlook I want to go home. Extensive goals of care discussion was had with pt's three sister's on 10/31. Decision was made to proceed with bone marrow biopsy on 11/2 to assess for response to therapy. Prelim bone marrow biopsy results showed response with 4% blasts. After discussion with HPOA Raymond [name] decision made to transition to home hospice.    Hyponatremia: New on this admission with drop from 136 to 126 between 10/27 to 10/28. Initially suspected to be hypervolemic given evidence of volume overload potentially also with component from low solute intake and/or psychogenic polydypsia given large volume PO water intake the day of the acute drop and intermittently thereafter w/ very poor PO food intake. Urine studies with Uosmol 479 and urine sodium < 5 (however urine studies obtained after already being diuresed) potentially consistent with hypervolemia.  Pt eventually placed on a 1.5 L fluid restriction and continued to be diuresed but sodium did not improve. Stable upon discharge to home hospice.  ??  Chronic Medical Problems:  HTN:??Holding amlodipine 10 mg daily  CAD:??Stents last placed on 02/2016 with reported EF of 50%. Most recent heart cath on 01/2018 was unremarkable. Holding IMDUR, diuresing as above, home metoprolol held then resumed  Chronic Pain: Home tramadol held given initial concerns for AMS, resumed at discharge.

## 2019-03-04 DIAGNOSIS — E8770 Fluid overload, unspecified: Secondary | ICD-10-CM | POA: Diagnosis not present

## 2019-03-04 DIAGNOSIS — R569 Unspecified convulsions: Secondary | ICD-10-CM | POA: Diagnosis not present

## 2019-03-04 DIAGNOSIS — C9200 Acute myeloblastic leukemia, not having achieved remission: Secondary | ICD-10-CM | POA: Diagnosis not present

## 2019-03-04 DIAGNOSIS — K567 Ileus, unspecified: Secondary | ICD-10-CM | POA: Diagnosis not present

## 2019-03-04 DIAGNOSIS — J9621 Acute and chronic respiratory failure with hypoxia: Secondary | ICD-10-CM | POA: Diagnosis not present

## 2019-03-04 LAB — BLOOD GAS CRITICAL CARE PANEL, ARTERIAL
BASE EXCESS ARTERIAL: 16.8 — ABNORMAL HIGH (ref -2.0–2.0)
BASE EXCESS ARTERIAL: 16.7 — ABNORMAL HIGH (ref -2.0–2.0)
CALCIUM IONIZED ARTERIAL (MG/DL): 4.28 mg/dL — ABNORMAL LOW (ref 4.40–5.40)
GLUCOSE WHOLE BLOOD: 123 mg/dL (ref 70–179)
GLUCOSE WHOLE BLOOD: 125 mg/dL (ref 70–179)
HCO3 ARTERIAL: 43 mmol/L — ABNORMAL HIGH (ref 22–27)
HCO3 ARTERIAL: 43 mmol/L — ABNORMAL HIGH (ref 22–27)
HEMOGLOBIN BLOOD GAS: 7.1 g/dL — ABNORMAL LOW (ref 13.50–17.50)
LACTATE BLOOD ARTERIAL: 0.6 mmol/L (ref <1.3)
LACTATE BLOOD ARTERIAL: 0.7 mmol/L (ref <1.3)
O2 SATURATION ARTERIAL: 95.4 % (ref 94.0–100.0)
O2 SATURATION ARTERIAL: 95.3 % (ref 94.0–100.0)
PCO2 ARTERIAL: 77.1 mmHg (ref 35.0–45.0)
PH ARTERIAL: 7.39 (ref 7.35–7.45)
PO2 ARTERIAL: 77.4 mmHg — ABNORMAL LOW (ref 80.0–110.0)
POTASSIUM WHOLE BLOOD: 4 mmol/L (ref 3.4–4.6)
POTASSIUM WHOLE BLOOD: 3.9 mmol/L (ref 3.4–4.6)
SODIUM WHOLE BLOOD: 139 mmol/L (ref 135–145)
SODIUM WHOLE BLOOD: 135 mmol/L (ref 135–145)

## 2019-03-04 LAB — CBC W/ AUTO DIFF
BASOPHILS RELATIVE PERCENT: 5.2 %
EOSINOPHILS ABSOLUTE COUNT: 0 10*9/L (ref 0.0–0.4)
EOSINOPHILS RELATIVE PERCENT: 1 %
HEMATOCRIT: 24.9 % — ABNORMAL LOW (ref 41.0–53.0)
HEMOGLOBIN: 7.8 g/dL — ABNORMAL LOW (ref 13.5–17.5)
LARGE UNSTAINED CELLS: 7 % — ABNORMAL HIGH (ref 0–4)
LYMPHOCYTES ABSOLUTE COUNT: 0.2 10*9/L — ABNORMAL LOW (ref 1.5–5.0)
MEAN CORPUSCULAR HEMOGLOBIN CONC: 31.2 g/dL (ref 31.0–37.0)
MEAN CORPUSCULAR HEMOGLOBIN: 30.6 pg (ref 26.0–34.0)
MEAN CORPUSCULAR VOLUME: 98.2 fL (ref 80.0–100.0)
MEAN PLATELET VOLUME: 10.2 fL — ABNORMAL HIGH (ref 7.0–10.0)
MONOCYTES ABSOLUTE COUNT: 0.1 10*9/L — ABNORMAL LOW (ref 0.2–0.8)
MONOCYTES RELATIVE PERCENT: 11.9 %
NEUTROPHILS ABSOLUTE COUNT: 0.2 10*9/L — CL (ref 2.0–7.5)
NEUTROPHILS RELATIVE PERCENT: 37 %
PLATELET COUNT: 41 10*9/L — ABNORMAL LOW (ref 150–440)
RED BLOOD CELL COUNT: 2.54 10*12/L — ABNORMAL LOW (ref 4.50–5.90)
RED CELL DISTRIBUTION WIDTH: 17 % — ABNORMAL HIGH (ref 12.0–15.0)
WBC ADJUSTED: 0.4 10*9/L — CL (ref 4.5–11.0)

## 2019-03-04 LAB — COMPREHENSIVE METABOLIC PANEL
ALBUMIN: 2.5 g/dL — ABNORMAL LOW (ref 3.5–5.0)
ALKALINE PHOSPHATASE: 143 U/L — ABNORMAL HIGH (ref 38–126)
ALT (SGPT): 108 U/L — ABNORMAL HIGH (ref <50)
AST (SGOT): 73 U/L — ABNORMAL HIGH (ref 19–55)
BILIRUBIN TOTAL: 2.2 mg/dL — ABNORMAL HIGH (ref 0.0–1.2)
BLOOD UREA NITROGEN: 33 mg/dL — ABNORMAL HIGH (ref 7–21)
BUN / CREAT RATIO: 44
CALCIUM: 7.8 mg/dL — ABNORMAL LOW (ref 8.5–10.2)
CHLORIDE: 88 mmol/L — ABNORMAL LOW (ref 98–107)
CO2: >40 mmol/L (ref 22.0–30.0)
EGFR CKD-EPI AA MALE: >90 mL/min/{1.73_m2} (ref >=60)
EGFR CKD-EPI NON-AA MALE: >90 mL/min/{1.73_m2} (ref >=60)
GLUCOSE RANDOM: 99 mg/dL (ref 70–179)
POTASSIUM: 4.1 mmol/L (ref 3.5–5.0)
PROTEIN TOTAL: 5.5 g/dL — ABNORMAL LOW (ref 6.5–8.3)
SODIUM: 136 mmol/L (ref 135–145)

## 2019-03-04 LAB — BASE EXCESS ARTERIAL: Base excess:SCnc:Pt:BldA:Qn:Calculated: 16.7 — ABNORMAL HIGH

## 2019-03-04 LAB — SMEAR REVIEW

## 2019-03-04 LAB — MAGNESIUM: Magnesium:MCnc:Pt:Ser/Plas:Qn:: 2

## 2019-03-04 LAB — NEUTROPHILS ABSOLUTE COUNT: Neutrophils:NCnc:Pt:Bld:Qn:Automated count: 0.2 — CL

## 2019-03-04 LAB — CALCIUM IONIZED ARTERIAL (MG/DL): Calcium.ionized:MCnc:Pt:Bld:Qn:: 4.51

## 2019-03-04 LAB — PRO-BNP: Natriuretic peptide.B prohormone N-Terminal:MCnc:Pt:Ser/Plas:Qn:: 4240 — ABNORMAL HIGH

## 2019-03-04 LAB — ALBUMIN: Albumin:MCnc:Pt:Ser/Plas:Qn:: 2.5 — ABNORMAL LOW

## 2019-03-04 MED ORDER — FLUTICASONE FUROATE-VILANTEROL 100-25 MCG/INH IN AEPB
1.00 | INHALATION_SPRAY | RESPIRATORY_TRACT | Status: DC
Start: 2019-03-13 — End: 2019-03-04

## 2019-03-04 MED ORDER — CALCIUM PANTOTHENATE
300.00 | Status: DC
Start: 2019-03-13 — End: 2019-03-04

## 2019-03-04 MED ORDER — TRANDOLAPRIL
2.00 | Status: DC
Start: 2019-03-04 — End: 2019-03-04

## 2019-03-04 MED ORDER — MAVIK 1 MG PO TABS
2.00 | ORAL_TABLET | ORAL | Status: DC
Start: 2019-03-04 — End: 2019-03-04

## 2019-03-04 MED ORDER — BAYER WOMENS 81-300 MG PO TABS
40.00 | ORAL_TABLET | ORAL | Status: DC
Start: 2019-03-12 — End: 2019-03-04

## 2019-03-04 MED ORDER — MYLANTA ULTRA 700-300 MG PO CHEW
3.00 | CHEWABLE_TABLET | ORAL | Status: DC
Start: 2019-03-04 — End: 2019-03-04

## 2019-03-04 MED ORDER — ROGAINE EXTRA STRENGTH FOR MEN 5 % EX SOLN
40.00 | CUTANEOUS | Status: DC
Start: 2019-03-05 — End: 2019-03-04

## 2019-03-04 MED ORDER — MEDI-TUSSIN DM DOUBLE STRENGTH 30-200 MG/5ML PO LIQD
1000.00 | ORAL | Status: DC
Start: ? — End: 2019-03-04

## 2019-03-04 MED ORDER — TYLENOL WITH CODEINE #3 300-30 MG PO TABS
ORAL_TABLET | ORAL | Status: DC
Start: 2019-03-12 — End: 2019-03-04

## 2019-03-04 MED ORDER — Medication
4.00 | Status: DC
Start: ? — End: 2019-03-04

## 2019-03-04 MED ORDER — GRANDPAS INDIAN CORN SOAP EX
500.00 | CUTANEOUS | Status: DC
Start: 2019-03-13 — End: 2019-03-04

## 2019-03-04 MED ORDER — Medication
1.00 | Status: DC
Start: 2019-03-04 — End: 2019-03-04

## 2019-03-04 MED ORDER — CITALOPRAM HYDROBROMIDE 20 MG PO TABS
20.00 | ORAL_TABLET | ORAL | Status: DC
Start: 2019-03-05 — End: 2019-03-04

## 2019-03-04 NOTE — Unmapped (Signed)
ADULT SPECIALTY CARE TEAM SWAB CONSULT    Adult Specialty Care Team to bedside for collection of a(n) nasopharyngeal swab(s) for COVID-19    Swabber:  Cedric Fishman RN  Monitor:  [facility] primary RN.    Appropriate PPE were available and donned per protocol.  Identity of the patient was confirmed via name, medical record number and date of birth.      Prior to specimen collection, team member spoke with primary RN.  Procedure was explained to patient.  Patient was in a normal room with no filter. The patient was unable to be swabbed due to adamant refusal by patient.     PPE was doffed as directed by hospital policy.     Procedure time in room: 20 minutes

## 2019-03-04 NOTE — Unmapped (Addendum)
Pt AAOx4, extremely drowsy. In beginning of shift pt was moaning and c/o severe abd pain, itchy rash across back and abdomen, and was febrile. MD paged and PRN benadryl and tylenol ordered, abd XR done. All other VSS, >90% on 50% HFNC. Pt then became extremely drowsy and has been since, but arouses easily. ABG drawn and decision made to leave pt on HFNC due to not being able to tolerate bipap, repeat ABGs scheduled.  CT of chest postponed until tomorrow due to pts status. EEG started tonight. Noted that pt left eye swollen and with drainage, MD paged and eye drops ordered. Condom cath in place with adequate UOP, no BM tonight. Fall and safety precautions maintained, will continue to monitor.     Problem: Adult Inpatient Plan of Care  Goal: Plan of Care Review  Outcome: Progressing  Goal: Patient-Specific Goal (Individualization)  Outcome: Progressing  Goal: Absence of Hospital-Acquired Illness or Injury  Outcome: Progressing  Goal: Optimal Comfort and Wellbeing  Outcome: Progressing  Goal: Readiness for Transition of Care  Outcome: Progressing  Goal: Rounds/Family Conference  Outcome: Progressing     Problem: Wound  Goal: Optimal Wound Healing  Outcome: Progressing     Problem: Fall Injury Risk  Goal: Absence of Fall and Fall-Related Injury  Outcome: Progressing     Problem: Skin Injury Risk Increased  Goal: Skin Health and Integrity  Outcome: Progressing     Problem: Respiratory Compromise (Pneumonia)  Goal: Effective Oxygenation and Ventilation  Outcome: Progressing     Problem: Hypertension Comorbidity  Goal: Blood Pressure in Desired Range  Outcome: Progressing

## 2019-03-04 NOTE — Unmapped (Signed)
Pt arrived from [facility] around 1300. A&Ox4, VSS. Arrived on 100% NRB, RT transitioned pt to HFNC 60% 40L. Around 1430, MD came by to assess pt and noticed L facial droop and pt was more drowsy. ARRT called and then BAT called. Pt initially refusing head CT, eventually was agreeable to it.   Pt intermittently refusing meds and care.  Condom cath in place. Adequate UOP. No BM. NPO for abd [place] tonight. Pending CT chest also. Stable now on 50% 40L HFNC. Pts sister given update.  Safety precautions in place. Will continue to monitor.    Problem: Adult Inpatient Plan of Care  Goal: Plan of Care Review  Outcome: Ongoing - Unchanged  Goal: Patient-Specific Goal (Individualization)  Outcome: Ongoing - Unchanged  Goal: Absence of Hospital-Acquired Illness or Injury  Outcome: Ongoing - Unchanged  Goal: Optimal Comfort and Wellbeing  Outcome: Ongoing - Unchanged  Goal: Readiness for Transition of Care  Outcome: Ongoing - Unchanged  Goal: Rounds/Family Conference  Outcome: Ongoing - Unchanged     Problem: Wound  Goal: Optimal Wound Healing  Outcome: Ongoing - Unchanged     Problem: Fall Injury Risk  Goal: Absence of Fall and Fall-Related Injury  Outcome: Ongoing - Unchanged     Problem: Skin Injury Risk Increased  Goal: Skin Health and Integrity  Outcome: Ongoing - Unchanged     Problem: Respiratory Compromise (Pneumonia)  Goal: Effective Oxygenation and Ventilation  Outcome: Ongoing - Unchanged     Problem: Hypertension Comorbidity  Goal: Blood Pressure in Desired Range  Outcome: Ongoing - Unchanged

## 2019-03-04 NOTE — Unmapped (Signed)
HISTORY   Raymond [name] is 63 y.o. years old male with evaluation of seizure or seizure like activity.    PROCDURE  Continuous vide-EEG was performed while awake utilizing 21 active electrodes placed according to the international 10-20 system.  The study was recorded digitally with a bandpass of 1-70Hz  and a sampling rate of 200Hz  and was reviewed with the possibility of multiple reformatting. The study was digitally processed with potential spike and seizure events identified for physician analysis and review.  Patient recognized events were identified by a push button marker and reviewed by the physician.  Background activity was reviewed from the continuously recorded data. Simultaneous video was reviewed for all patient events.    TECHNICAL DESCRIPTION  DETAIL:  Day 1 (03:22 on 03/04/2019 - 06:17 on 03/04/2019)  The best waking record shows a good , fair  organization at rest, consisting of a 20-40 uV 8-9 Hz posterior dominant rhythm with fair  reactivity. There is a moderate bilateral beta activity.     Excessive to nearly continuous drowsiness is characterized by attenuation of the background, vertex waves, and bilateral theta and delta slowing. Stage II sleep is characterized by slowing, vertex waves, K-complexes, and symmetric sleep spindles. Slow wave and REM sleep are recorded.    There is no push button clinical event.    IMPRESSION   This continuous EEG with video monitoring over 3 hours does not show any definitive abnormality. Borderline slowing is noted but may not exceed the normal limits of excessive to nearly continuous drowsy and sleep state.      CLINICAL INTERPRETATION

## 2019-03-04 NOTE — Unmapped (Signed)
Care Management  Initial Transition Planning Assessment    CM initial assessment completed telephonically as a precautionary measure in accordance with [facility] COVID-19 Pandemic emergency response plan. Patient verbalized understanding and agreement with completing this assessment by telephone.     Per H&P: 63 y/o with PMH of adverse risk AML previously on venitoclax ??PMHx of NSTEMI/CAD s/t stents, DMT2, HLD, HTN, COPD, depression/anxiety, mild cognitive impairment presents with hypoxia, also with acute facial droop today, facial edema, diffuse rash, persistent LFT elevation.    Type of Residence: Mailing Address:  [address] [place] [address]  Patient Phone Number: [phone] (home) [phone] (work)        Medical Provider(s): Donnald Garre, MD  Previous admit date: 02/11/2019    Primary Insurance- Payor: [employer] MEDICARE ADV / Plan: Francine Graven GOLD PLUS HMO / Product Type: *No Product type* /   Secondary Insurance ??? Secondary Insurance  MEDICAID [place]  Prescription Coverage ??? Yes  Preferred Pharmacy - [facility] - [place] ([place]), [place] - [address]  [facility] [place] - [address]  [facility] [place]  [facility] [place]  [facility] [phone] - [place], [place] - [address] AT [facility] [address]    Transportation home: Private vehicle - sister  Level of function prior to admission: Independent with ADL's, no mobility aides, has home oxygen & cpap. Patient says cpap left in [place], [place]. Patient lives with sister [name] who assist him when needed, lives in a trailer. Patient denies food insecurity & unable to recall oxygen company name. Patient is open for [facility] services, no preferences. Patient says no HH services from previous hospitalization. [facility] [phone], confirmed no services for patient.               General Care Manager assessed the patient by : Medical record review, Discussion with Clinical Care team(phone interview with patient, patient on airborne isolation)  Orientation Level: Oriented X4  Who provides care at home?: Other (Comment), Family member(Independent with self care as much as possible, sister support as needed)  Reason for referral: Discharge Planning    Contact/Decision Maker  Extended Emergency Contact Information  Primary Emergency Contact: Abernathy,Joyce  Address[address] [place] [address] [place]  Home Phone: [phone]  Mobile Phone: [phone]  Relation: Sister  Secondary Emergency Contact: Lanning,Connie  Mobile Phone: [phone]  Relation: Sister    Legal Next of Kin / Guardian / POA / Advance Directives     HCDM (patient stated preference): [name] - Sister - [phone]    Advance Directive (Medical Treatment)  Does patient have an advance directive covering medical treatment?: Patient does not have advance directive covering medical treatment.  Reason patient does not have an advance directive covering medical treatment:: Patient does not wish to complete one at this time.    Health Care Decision Maker [HCDM] (Medical & Mental Health Treatment)  Healthcare Decision Maker: HCDM documented in the HCDM/Contact Info section.  Information offered on HCDM, Medical & Mental Health advance directives:: Patient declined information.    Advance Directive (Mental Health Treatment)  Does patient have an advance directive covering mental health treatment?: Patient does not have advance directive covering mental health treatment.  Reason patient does not have an advance directive covering mental health treatment:: Patient does not  wish to complete one at this time.    Patient Information  Lives with: Family members(sister [name])    Type of Residence: Private residence        Location/Detail: [address] [place] [address] [place], [place] [address] Support Systems/Concerns: Family Members    Responsibilities/Dependents at home?: No    Home Care services in place prior to admission?: No        Outpatient/Community Resources in place prior to admission: Clinic  Agency detail (Name/Phone #): PCP: Magnus Sinning    Equipment Currently Used at Home: oxygen, respiratory supplies(cpap)  Current HME Agency (Name/Phone #): pt does not recall company name for O2    Currently receiving outpatient dialysis?: No     Financial Information     Need for financial assistance?: No     Social Determinants of Health  Social History     Socioeconomic History   ??? Marital status: Single     Spouse name: Not on file   ??? Number of children: Not on file   ??? Years of education: Not on file   ??? Highest education level: Not on file   Occupational History   ??? Not on file   Social Needs   ??? Financial resource strain: Not on file   ??? Food insecurity     Worry: Never true     Inability: Never true   ??? Transportation needs     Medical: Not on file     Non-medical: Not on file   Tobacco Use   ??? Smoking status: Former Smoker     Packs/day: 1.00     Years: 5.00     Pack years: 5.00     Types: Cigarettes     Quit date: 04/11/2014     Years since quitting: 4.8   ??? Smokeless tobacco: Never Used   Substance and Sexual Activity   ??? Alcohol use: No     Alcohol/week: 0.0 standard drinks   ??? Drug use: No   ??? Sexual activity: Not Currently     Partners: Female   Lifestyle   ??? Physical activity     Days per week: Not on file     Minutes per session: Not on file   ??? Stress: Not on file   Relationships   ??? Social Wellsite geologist on phone: Not on file     Gets together: Not on file     Attends religious service: Not on file     Active member of club or organization: Not on file     Attends meetings of clubs or organizations: Not on file     Relationship status: Not on file   Other Topics Concern   ??? Do you use sunscreen? No   ??? Tanning bed use? No   ??? Are you easily burned? No ??? Excessive sun exposure? No   ??? Blistering sunburns? No   Social History Narrative   ??? Not on file     Housing/Utilities   ??? Within the past 12 months, have you ever stayed: outside, in a car, in a tent, in an overnight shelter, or temporarily in someone else's home (i.e. couch-surfing)?     ??? Are you worried about losing your housing?     ??? Within the past 12 months, have you been unable to get utilities (heat, electricity) when it was really needed?       Literacy   ??? How often do you  need to have someone help you when you read instructions, pamphlets, or other written material from your doctor or pharmacy?         Discharge Needs Assessment  Concerns to be Addressed: care coordination/care conferences, discharge planning    Clinical Risk Factors: Multiple Diagnoses (Chronic), Readmission < 30 Days, New Diagnosis    Barriers to taking medications: No    Prior overnight hospital stay or ED visit in last 90 days: Yes    Readmission Within the Last 30 Days: current reason for admission unrelated to previous admission     Anticipated Changes Related to Illness: inability to care for self    Equipment Needed After Discharge: other (see comments)(TBD)    Discharge Facility/Level of Care Needs: other (see comments)(home with home health)    Readmission  Risk of Unplanned Readmission Score: UNPLANNED READMISSION SCORE: 22%  Predictive Model Details           21% (Medium) Factors Contributing to Score   Calculated 03/04/2019 11:06 23% Number of active Rx orders is 37   Gap Risk of Unplanned Readmission Model 10% Diagnosis of cancer is present     9% ECG/EKG order is present in last 6 months     8% Latest calcium is low (7.8 mg/dL)     7% Latest BUN is high (33 mg/dL)     6% Imaging order is present in last 6 months     6% Latest hemoglobin is low (7.8 g/dL)     6% Phosphorous result is present     5% Charlson Comorbidity Index is 5     5% Age is 63     4% Number of hospitalizations in last year is 1 4% Diagnosis of deficiency anemia is present     4% Active corticosteroid Rx order is present     2% Future appointment is scheduled     1% Active ulcer medication Rx order is present     1% Current length of stay is 0.93 days     Readmitted Within the Last 30 Days? (No if blank) Yes  Patient at risk for readmission?: Yes    Discharge Plan  Screen findings are: Discharge planning needs identified or anticipated (Comment).    Expected Discharge Date:     Expected Transfer from Critical Care:      Patient and/or family were provided with choice of facilities / services that are available and appropriate to meet post hospital care needs?: Yes   List choices in order highest to lowest preferred, if applicable. : Open for HH, no preference. Patient says no HH service from last hospitalization    Initial Assessment complete?: Yes

## 2019-03-04 NOTE — Unmapped (Signed)
Pt on HFNC overnight. Pt tolerated breathing treatment with no complication. Per MD verbal, pt BiPAP was put on hold for the night. Will continue to monitor.

## 2019-03-04 NOTE — Unmapped (Signed)
ADULT SPECIALTY CARE TEAM SWAB CONSULT    Adult Specialty Care Team to bedside for collection of a(n) nasopharyngeal swab(s) for Respiratory Pathogen Panel(RPP) and COVID-19 testing    Swabber:  Cedric Fishman RN  Monitor:  [facility] primary RN.    Appropriate PPE were available and donned per protocol.  Identity of the patient was confirmed via name, medical record number and date of birth.      Prior to specimen collection, team member spoke with primary RN.  Procedure was explained to patient.  Patient was in a normal room with no filter. The patient was swabbed.     PPE was doffed as directed by hospital policy. Sample delivered to lab in person.    Procedure time in room: 20 minutes

## 2019-03-04 NOTE — Unmapped (Signed)
Follow-up Consult Note        Requesting Attending Physician:  Windell Norfolk Zeidner,*  Service Requesting Consult: Oncology/Hematology (MDE)     Assessment and Plan          Raymond [name] is a 63 y.o. 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  ??  Left Sided Weakness:  The patient initially presented with reported left gaze preference, left facial droop and LUE weakness with an NIHSS of 8. His thrombocytopenia immediately excluded him from tPA. Upon neurology evaluation, could not appreciate previously identified left facial droop or gaze preference on exam, though did note some mild left pronator drift without any sensory deficits or aphasia, which gives him a NIHSS of 1, thus making a large vessel occlusion unlikely. Another possibility, given the quick resolution of symptoms and gaze preference towards the side of weakness, could be related to intraictal and then post-epileptic activity (Todd's phenomenon). The leading etiology being a possible intracranial hemorrhage acting as a seizure focus given his thrombocytopenia, however, no hemorrhage seen on CTH. No focality was observed on EEG, only borderline diffuse slowing and so can defer additional neuroimaging for now.   ??  RECOMMENDATIONS:  1. Discontinue EEG  ??  Neurology will sign off. Please feel free to page Neurology adult consults at [phone] for any further questions or concerns.    This patient was discussed with Dr. Raenette Rover who agrees with the above assessment and plan.    ??  Lyla Son, MD  PGY-2, Neurology       ATTESTATION NOTE:  I discussed the patient's case with the resident. I reviewed the resident???s note and agree with the resident???s findings and plan as documented in their note.   Tawanna Cooler, MD        Subjective Reason for Consult: BAT Code for Left sided facial droop, weakness    Patient febrile overnight was made NPO for concerns of ileus vs possible SBO as seen on KUB. Patient reports feeling fine, he denies any weakness or feeling as though his face is numb or weak. He is primarily concerned about getting something to drink.          Objective        Temp:  [36.6 ??C-39.4 ??C] 37.2 ??C  Heart Rate:  [65-92] 80  SpO2 Pulse:  [65-94] 80  Resp:  [14-27] 14  BP: (96-143)/(30-62) 104/47  MAP (mmHg):  [55-78] 62  FiO2 (%):  [50 %-100 %] 50 %  SpO2:  [90 %-100 %] 98 %  BMI (Calculated):  [34.66] 34.66  I/O this shift:  In: 950 [P.O.:950]  Out: -     Physical Exam:  General Appearance:Overweight.  HEENT: Head is atraumatic and normocephalic. Sclera anicteric without injection. Oropharyngeal membranes are moist with no erythema or exudate.  Neck: Deferred.  Lungs: Normal work of breathing  Heart: Regular rate and rhythm.  Abdomen: Nondistended.  Extremities: No clubbing or cyanosis.    Neurological Examination:     Mental Status: Attention and concentration grossly intact to interview and physical.  ??  Cranial Nerves: PERRL 3 mm. Pursuit eye movements were uninterrupted with full range and without more than end-gaze nystagmus. Facial sensation intact bilaterally to light touch on the forehead, cheek, and chin. Face symmetric at rest. Normal facial movement bilaterally, including forehead, eye  closure and grimace/smile. Hearing intact to conversation. Shoulder shrug full strength bilaterally. Palate movement is symmetric. Tongue protrudes midline and tongue movements are normal.  ??  Motor Exam: Normal bulk.  No tremors, myoclonus, or other adventitious movement.  No Pronator drift  UE R/L: biceps 5/4+, triceps 5/4+ and hand grip mildly weak/mildly weak. (poor effort 2/2 decreasing compliance with exam)  LE R/L: hip flexion 3/3, quadriceps 3/3, hamstrings 3/3, dorsiflexion 5/5 and plantar flexion 5/5.  ??  Reflexes: deferred Sensory: grossly intact to LT and temp.  ??  Cerebellar/Coordination/Gait: Finger-to-nose is normal without ataxia or dysmetria bilaterally.          Medications:  Scheduled medications:   ??? Cefepime  2 g Intravenous Q8H   ??? citalopram  20 mg Oral Daily   ??? fluticasone furoate-vilanteroL  1 puff Inhalation Daily (RT)   ??? hydrocortisone   Topical BID   ??? ipratropium-albuteroL  3 mL Nebulization Q6H (RT)   ??? pantoprazole  40 mg Oral BID   ??? polyethylen glycol  17 g Oral TID   ??? polymyxin B sulf-trimethoprim  1 drop Left Eye 6x Daily   ??? posaconazole  300 mg Oral Daily   ??? predniSONE  40 mg Oral Daily   ??? senna  2 tablet Oral Nightly   ??? valACYclovir  500 mg Oral Daily     Continuous infusions:   PRN medications: acetaminophen, ondansetron     Diagnostic Studies      All Labs Last 24hrs:   Recent Results (from the past 24 hour(s))   APTT    Collection Time: 03/03/19  1:40 PM   Result Value Ref Range    APTT 20.7 (L) 25.3 - 37.1 sec    Heparin Correlation <0.2    Comprehensive Metabolic Panel    Collection Time: 03/03/19  1:40 PM   Result Value Ref Range    Sodium 136 135 - 145 mmol/L    Potassium 4.0 3.5 - 5.0 mmol/L    Chloride 90 (L) 98 - 107 mmol/L    Anion Gap      CO2 >40.0 (HH) 22.0 - 30.0 mmol/L    BUN 32 (H) 7 - 21 mg/dL    Creatinine 1.61 0.96 - 1.30 mg/dL    BUN/Creatinine Ratio 32     EGFR CKD-EPI Non-African American, Male 80 >=60 mL/min/1.74m2    EGFR CKD-EPI African American, Male >90 >=60 mL/min/1.53m2    Glucose 81 70 - 99 mg/dL    Calcium 7.9 (L) 8.5 - 10.2 mg/dL    Albumin 2.7 (L) 3.5 - 5.0 g/dL    Total Protein 5.8 (L) 6.5 - 8.3 g/dL    Total Bilirubin 2.4 (H) 0.0 - 1.2 mg/dL    AST 96 (H) 19 - 55 U/L    ALT 143 (H) <50 U/L    Alkaline Phosphatase 164 (H) 38 - 126 U/L   D-Dimer, Quantitative    Collection Time: 03/03/19  1:40 PM   Result Value Ref Range    D-Dimer 1,412 (H) <230 ng/mL DDU   Lactate dehydrogenase    Collection Time: 03/03/19  1:40 PM   Result Value Ref Range LDH 1,488 (H) 338 - 610 U/L   Magnesium Level    Collection Time: 03/03/19  1:40 PM   Result Value Ref Range    Magnesium 1.9 1.6 - 2.2 mg/dL   Phosphorus Level    Collection Time: 03/03/19  1:40 PM   Result Value Ref Range    Phosphorus  4.3 2.9 - 4.7 mg/dL   PT-INR    Collection Time: 03/03/19  1:40 PM   Result Value Ref Range    PT 16.8 (H) 10.2 - 13.1 sec    INR 1.45    Uric acid    Collection Time: 03/03/19  1:40 PM   Result Value Ref Range    Uric Acid 6.4 4.0 - 9.0 mg/dL   Type and Screen    Collection Time: 03/03/19  1:40 PM   Result Value Ref Range    ABO Grouping A POS     Antibody Screen NEG    CBC w/ Differential    Collection Time: 03/03/19  1:40 PM   Result Value Ref Range    WBC 0.5 (L) 4.5 - 11.0 10*9/L    RBC 2.53 (L) 4.50 - 5.90 10*12/L    HGB 7.8 (L) 13.5 - 17.5 g/dL    HCT 16.1 (L) 09.6 - 53.0 %    MCV 98.3 80.0 - 100.0 fL    MCH 31.0 26.0 - 34.0 pg    MCHC 31.6 31.0 - 37.0 g/dL    RDW 04.5 (H) 40.9 - 15.0 %    MPV 10.4 (H) 7.0 - 10.0 fL    Platelet 56 (L) 150 - 440 10*9/L    nRBC 21 (H) <=4 /100 WBCs    Neutrophil Left Shift 1+ (A) Not Present    Macrocytosis Moderate (A) Not Present    Anisocytosis Slight (A) Not Present    Hypochromasia Marked (A) Not Present   Manual Differential    Collection Time: 03/03/19  1:40 PM   Result Value Ref Range    Neutrophils % 57 %    Lymphocytes % 28 %    Monocytes % 13 %    Eosinophils % 2 %    Basophils % 0 %    Absolute Neutrophils 0.3 (LL) 2.0 - 7.5 10*9/L    Absolute Lymphocytes 0.1 (L) 1.5 - 5.0 10*9/L    Absolute Monocytes 0.1 (L) 0.2 - 0.8 10*9/L    Absolute Eosinophils 0.0 0.0 - 0.4 10*9/L    Absolute Basophils 0.0 0.0 - 0.1 10*9/L    Smear Review Comments See Comment (A) Undefined    Schistocytes Rare (A) Not Present    Toxic Vacuolation Present (A) Not Present    Poikilocytosis Moderate (A) Not Present   POCT Glucose    Collection Time: 03/03/19  2:39 PM   Result Value Ref Range    Glucose, POC 71 70 - 179 mg/dL   Carboxyhemoglobin/POC Collection Time: 03/03/19  2:43 PM   Result Value Ref Range    Carboxyhemoglobin 1.3 (H) <1.2 %   GLUCOSE-BG/POC    Collection Time: 03/03/19  2:43 PM   Result Value Ref Range    Glucose Whole Blood 85 70 - 179 mg/dL   POCT Arterial Blood Gas    Collection Time: 03/03/19  2:43 PM   Result Value Ref Range    pH, Arterial 7.36 7.35 - 7.45    pCO2, Arterial 78 (HH) 35 - 45 mm[Hg]    pO2, Arterial 59 (L) 80 - 110 mm[Hg]    HCO3 (Bicarbonate), Arterial 43.3 (H) 22.0 - 27.0 mmol/L    Base Excess, Arterial 17.8 (H) -2.0 - 2.0 mmol/L    O2 Sat, Arterial 88.9 (L) 94.0 - 100.0 %    Specimen Type, Arterial Arterial     FIO2 53 %   HEMOGLOBIN BG/POC    Collection Time: 03/03/19  2:43  PM   Result Value Ref Range    Hemoglobin 7.4 (L) 13.5 - 17.5 g/dL   POCT Ionized Calcium    Collection Time: 03/03/19  2:43 PM   Result Value Ref Range    Ionized Calcium 4.5 4.4 - 5.4 mg/dL   POTASSIUM-BG/POC    Collection Time: 03/03/19  2:43 PM   Result Value Ref Range    Potassium, Bld 4.0 3.4 - 4.6 mmol/L   POCT Lactic Acid (Lactate), Arterial    Collection Time: 03/03/19  2:43 PM   Result Value Ref Range    Lactate, Arterial 0.5 <1.3 mmol/L   METHEMOGLOBIN/POC    Collection Time: 03/03/19  2:43 PM   Result Value Ref Range    Methemoglobin <1.0 <1.5 %   SODIUM-BG/POC    Collection Time: 03/03/19  2:43 PM   Result Value Ref Range    Sodium Whole Blood 141 135 - 145 mmol/L   POCT Arterial Oxyhemoglobin    Collection Time: 03/03/19  2:43 PM   Result Value Ref Range    Oxyhemoglobin 87.0 (L) 94.0 - 100.0 %   Urinalysis    Collection Time: 03/03/19  7:43 PM   Result Value Ref Range    Color, UA Yellow     Clarity, UA Clear     Specific Gravity, UA 1.009 1.003 - 1.030    pH, UA 5.0 5.0 - 9.0    Leukocyte Esterase, UA Negative Negative    Nitrite, UA Negative Negative    Protein, UA Negative Negative    Glucose, UA Negative Negative    Ketones, UA Negative Negative    Urobilinogen, UA 2.0 mg/dL (A) 0.2 mg/dL, 1.0 mg/dL Bilirubin, UA Negative Negative    Blood, UA Negative Negative    RBC, UA <1 <=3 /HPF    WBC, UA <1 <=2 /HPF    Squam Epithel, UA <1 0 - 5 /HPF    Bacteria, UA Rare (A) None Seen /HPF    Granular Casts, UA 1 (H) 0 /LPF    Hyaline Casts, UA 3 (H) 0 - 1 /LPF    Mucus, UA Occasional (A) None Seen /HPF   Blood Gas Critical Care Panel, Arterial    Collection Time: 03/03/19  9:28 PM   Result Value Ref Range    Specimen Source Arterial     FIO2 Arterial Not Specified     pH, Arterial 7.40 7.35 - 7.45    pCO2, Arterial 70.2 (HH) 35.0 - 45.0 mm Hg    pO2, Arterial 67.6 (L) 80.0 - 110.0 mm Hg    HCO3 (Bicarbonate), Arterial 43 (H) 22 - 27 mmol/L    Base Excess, Arterial 17.2 (H) -2.0 - 2.0    O2 Sat, Arterial 93.1 (L) 94.0 - 100.0 %    Sodium Whole Blood 135 135 - 145 mmol/L    Potassium, Bld 3.7 3.4 - 4.6 mmol/L    Calcium, Ionized Arterial 4.20 (L) 4.40 - 5.40 mg/dL    Glucose Whole Blood 105 70 - 179 mg/dL    Lactate, Arterial 0.8 <1.3 mmol/L    Hgb, blood gas 7.30 (L) 13.50 - 17.50 g/dL   Blood Gas Critical Care Panel, Arterial    Collection Time: 03/04/19  1:38 AM   Result Value Ref Range    Specimen Source Arterial     FIO2 Arterial Not Specified     pH, Arterial 7.37 7.35 - 7.45    pCO2, Arterial 77.1 (HH) 35.0 - 45.0 mm Hg  pO2, Arterial 77.4 (L) 80.0 - 110.0 mm Hg    HCO3 (Bicarbonate), Arterial 43 (H) 22 - 27 mmol/L    Base Excess, Arterial 16.8 (H) -2.0 - 2.0    O2 Sat, Arterial 95.4 94.0 - 100.0 %    Sodium Whole Blood 139 135 - 145 mmol/L    Potassium, Bld 4.0 3.4 - 4.6 mmol/L    Calcium, Ionized Arterial 4.51 4.40 - 5.40 mg/dL    Glucose Whole Blood 123 70 - 179 mg/dL    Lactate, Arterial 0.6 <1.3 mmol/L    Hgb, blood gas 7.10 (L) 13.50 - 17.50 g/dL   Blood Gas Critical Care Panel, Arterial    Collection Time: 03/04/19  5:43 AM   Result Value Ref Range    Specimen Source Arterial     FIO2 Arterial Not Specified     pH, Arterial 7.39 7.35 - 7.45    pCO2, Arterial 72.3 (HH) 35.0 - 45.0 mm Hg pO2, Arterial 75.6 (L) 80.0 - 110.0 mm Hg    HCO3 (Bicarbonate), Arterial 43 (H) 22 - 27 mmol/L    Base Excess, Arterial 16.7 (H) -2.0 - 2.0    O2 Sat, Arterial 95.3 94.0 - 100.0 %    Sodium Whole Blood 135 135 - 145 mmol/L    Potassium, Bld 3.9 3.4 - 4.6 mmol/L    Calcium, Ionized Arterial 4.28 (L) 4.40 - 5.40 mg/dL    Glucose Whole Blood 125 70 - 179 mg/dL    Lactate, Arterial 0.7 <1.3 mmol/L    Hgb, blood gas 6.70 (L) 13.50 - 17.50 g/dL   Comprehensive Metabolic Panel    Collection Time: 03/04/19  8:46 AM   Result Value Ref Range    Sodium 136 135 - 145 mmol/L    Potassium 4.1 3.5 - 5.0 mmol/L    Chloride 88 (L) 98 - 107 mmol/L    Anion Gap      CO2 >40.0 (HH) 22.0 - 30.0 mmol/L    BUN 33 (H) 7 - 21 mg/dL    Creatinine 1.61 0.96 - 1.30 mg/dL    BUN/Creatinine Ratio 44     EGFR CKD-EPI Non-African American, Male >90 >=60 mL/min/1.103m2    EGFR CKD-EPI African American, Male >90 >=60 mL/min/1.23m2    Glucose 99 70 - 179 mg/dL    Calcium 7.8 (L) 8.5 - 10.2 mg/dL    Albumin 2.5 (L) 3.5 - 5.0 g/dL    Total Protein 5.5 (L) 6.5 - 8.3 g/dL    Total Bilirubin 2.2 (H) 0.0 - 1.2 mg/dL    AST 73 (H) 19 - 55 U/L    ALT 108 (H) <50 U/L    Alkaline Phosphatase 143 (H) 38 - 126 U/L   Magnesium Level    Collection Time: 03/04/19  8:46 AM   Result Value Ref Range    Magnesium 2.0 1.6 - 2.2 mg/dL   CBC w/ Differential    Collection Time: 03/04/19  8:46 AM   Result Value Ref Range    WBC 0.4 (LL) 4.5 - 11.0 10*9/L    RBC 2.54 (L) 4.50 - 5.90 10*12/L    HGB 7.8 (L) 13.5 - 17.5 g/dL    HCT 04.5 (L) 40.9 - 53.0 %    MCV 98.2 80.0 - 100.0 fL    MCH 30.6 26.0 - 34.0 pg    MCHC 31.2 31.0 - 37.0 g/dL    RDW 81.1 (H) 91.4 - 15.0 %    MPV 10.2 (H) 7.0 - 10.0 fL  Platelet 41 (L) 150 - 440 10*9/L    nRBC 13 (H) <=4 /100 WBCs    Neutrophils % 37.0 %    Lymphocytes % 43.2 %    Monocytes % 11.9 %    Eosinophils % 1.0 %    Basophils % 5.2 %    Absolute Neutrophils 0.2 (LL) 2.0 - 7.5 10*9/L    Absolute Lymphocytes 0.2 (L) 1.5 - 5.0 10*9/L Absolute Monocytes 0.1 (L) 0.2 - 0.8 10*9/L    Absolute Eosinophils 0.0 0.0 - 0.4 10*9/L    Absolute Basophils 0.0 0.0 - 0.1 10*9/L    Large Unstained Cells 7 (H) 0 - 4 %    Macrocytosis Moderate (A) Not Present    Anisocytosis Slight (A) Not Present    Hypochromasia Marked (A) Not Present   Morphology Review    Collection Time: 03/04/19  8:46 AM   Result Value Ref Range    Smear Review Comments See Comment (A) Undefined     ??  [facility], 03/03/19, personally reviewed:  No acute hemorrhage

## 2019-03-04 NOTE — Unmapped (Signed)
VSS. Weaned from 50% 40L HFNC to 40% 40L HFNC, will continue to wean as tolerated. COVID and resp panel swab sent- all negative. Afebrile today. Adequate UOP with condom cath. No BM, passing gas.   This afternoon pt started having increasing shakiness in his BUE. MD notified and to bedside to assess. Pt stable otherwise.  Sister at bedside visiting.  Safety precautions in place. Will continue to monitor.    Nail care nurse to bedside to trim pts toenails  Problem: Adult Inpatient Plan of Care  Goal: Plan of Care Review  Outcome: Ongoing - Unchanged  Goal: Patient-Specific Goal (Individualization)  Outcome: Ongoing - Unchanged  Goal: Absence of Hospital-Acquired Illness or Injury  Outcome: Ongoing - Unchanged  Goal: Optimal Comfort and Wellbeing  Outcome: Ongoing - Unchanged  Goal: Readiness for Transition of Care  Outcome: Ongoing - Unchanged  Goal: Rounds/Family Conference  Outcome: Ongoing - Unchanged     Problem: Wound  Goal: Optimal Wound Healing  Outcome: Ongoing - Unchanged     Problem: Fall Injury Risk  Goal: Absence of Fall and Fall-Related Injury  Outcome: Ongoing - Unchanged     Problem: Skin Injury Risk Increased  Goal: Skin Health and Integrity  Outcome: Ongoing - Unchanged     Problem: Respiratory Compromise (Pneumonia)  Goal: Effective Oxygenation and Ventilation  Outcome: Ongoing - Unchanged     Problem: Hypertension Comorbidity  Goal: Blood Pressure in Desired Range  Outcome: Ongoing - Unchanged     Problem: Self-Care Deficit  Goal: Improved Ability to Complete Activities of Daily Living  Outcome: Ongoing - Unchanged     Problem: Infection  Goal: Infection Symptom Resolution  Outcome: Ongoing - Unchanged

## 2019-03-05 DIAGNOSIS — R609 Edema, unspecified: Secondary | ICD-10-CM | POA: Diagnosis not present

## 2019-03-05 DIAGNOSIS — R569 Unspecified convulsions: Secondary | ICD-10-CM | POA: Diagnosis not present

## 2019-03-05 DIAGNOSIS — J9621 Acute and chronic respiratory failure with hypoxia: Secondary | ICD-10-CM | POA: Diagnosis not present

## 2019-03-05 DIAGNOSIS — E8770 Fluid overload, unspecified: Secondary | ICD-10-CM | POA: Diagnosis not present

## 2019-03-05 DIAGNOSIS — J811 Chronic pulmonary edema: Secondary | ICD-10-CM | POA: Diagnosis not present

## 2019-03-05 DIAGNOSIS — E871 Hypo-osmolality and hyponatremia: Secondary | ICD-10-CM | POA: Diagnosis not present

## 2019-03-05 DIAGNOSIS — C9200 Acute myeloblastic leukemia, not having achieved remission: Secondary | ICD-10-CM | POA: Diagnosis not present

## 2019-03-05 DIAGNOSIS — R918 Other nonspecific abnormal finding of lung field: Secondary | ICD-10-CM | POA: Diagnosis not present

## 2019-03-05 DIAGNOSIS — J90 Pleural effusion, not elsewhere classified: Secondary | ICD-10-CM | POA: Diagnosis not present

## 2019-03-05 LAB — COMPREHENSIVE METABOLIC PANEL
ALBUMIN: 2.3 g/dL — ABNORMAL LOW (ref 3.5–5.0)
ALT (SGPT): 88 U/L — ABNORMAL HIGH (ref <50)
AST (SGOT): 63 U/L — ABNORMAL HIGH (ref 19–55)
BLOOD UREA NITROGEN: 24 mg/dL — ABNORMAL HIGH (ref 7–21)
CALCIUM: 7.4 mg/dL — ABNORMAL LOW (ref 8.5–10.2)
CHLORIDE: 84 mmol/L — ABNORMAL LOW (ref 98–107)
CO2: >40 mmol/L (ref 22.0–30.0)
CREATININE: 0.72 mg/dL (ref 0.70–1.30)
EGFR CKD-EPI AA MALE: >90 mL/min/{1.73_m2} (ref >=60)
EGFR CKD-EPI NON-AA MALE: >90 mL/min/{1.73_m2} (ref >=60)
GLUCOSE RANDOM: 102 mg/dL (ref 70–179)
POTASSIUM: 4.1 mmol/L (ref 3.5–5.0)
PROTEIN TOTAL: 5 g/dL — ABNORMAL LOW (ref 6.5–8.3)
SODIUM: 126 mmol/L — ABNORMAL LOW (ref 135–145)

## 2019-03-05 LAB — CBC W/ AUTO DIFF
BASOPHILS ABSOLUTE COUNT: 0 10*9/L (ref 0.0–0.1)
BASOPHILS RELATIVE PERCENT: 5.1 %
EOSINOPHILS RELATIVE PERCENT: 1.8 %
HEMATOCRIT: 21.6 % — ABNORMAL LOW (ref 41.0–53.0)
HEMOGLOBIN: 6.8 g/dL — ABNORMAL LOW (ref 13.5–17.5)
LARGE UNSTAINED CELLS: 9 % — ABNORMAL HIGH (ref 0–4)
LYMPHOCYTES ABSOLUTE COUNT: 0.2 10*9/L — ABNORMAL LOW (ref 1.5–5.0)
MEAN CORPUSCULAR HEMOGLOBIN CONC: 31.6 g/dL (ref 31.0–37.0)
MEAN CORPUSCULAR HEMOGLOBIN: 30.3 pg (ref 26.0–34.0)
MEAN CORPUSCULAR VOLUME: 95.8 fL (ref 80.0–100.0)
MEAN PLATELET VOLUME: 10 fL (ref 7.0–10.0)
MONOCYTES ABSOLUTE COUNT: 0 10*9/L — ABNORMAL LOW (ref 0.2–0.8)
MONOCYTES RELATIVE PERCENT: 5.9 %
NEUTROPHILS ABSOLUTE COUNT: 0.1 10*9/L — CL (ref 2.0–7.5)
NEUTROPHILS RELATIVE PERCENT: 34.5 %
NUCLEATED RED BLOOD CELLS: 12 /100{WBCs} — ABNORMAL HIGH (ref <=4)
RED BLOOD CELL COUNT: 2.26 10*12/L — ABNORMAL LOW (ref 4.50–5.90)
RED CELL DISTRIBUTION WIDTH: 17.4 % — ABNORMAL HIGH (ref 12.0–15.0)
WBC ADJUSTED: 0.3 10*9/L — CL (ref 4.5–11.0)

## 2019-03-05 LAB — SLIDE REVIEW

## 2019-03-05 LAB — MAGNESIUM
Magnesium:MCnc:Pt:Ser/Plas:Qn:: 2.1
Magnesium:MCnc:Pt:Ser/Plas:Qn:: 2.1

## 2019-03-05 LAB — BASIC METABOLIC PANEL
BLOOD UREA NITROGEN: 22 mg/dL — ABNORMAL HIGH (ref 7–21)
BLOOD UREA NITROGEN: 24 mg/dL — ABNORMAL HIGH (ref 7–21)
BUN / CREAT RATIO: 30
BUN / CREAT RATIO: 33
CALCIUM: 7.7 mg/dL — ABNORMAL LOW (ref 8.5–10.2)
CALCIUM: 7.4 mg/dL — ABNORMAL LOW (ref 8.5–10.2)
CHLORIDE: 89 mmol/L — ABNORMAL LOW (ref 98–107)
CHLORIDE: 84 mmol/L — ABNORMAL LOW (ref 98–107)
EGFR CKD-EPI AA MALE: >90 mL/min/{1.73_m2} (ref >=60)
EGFR CKD-EPI AA MALE: >90 mL/min/{1.73_m2} (ref >=60)
EGFR CKD-EPI NON-AA MALE: >90 mL/min/{1.73_m2} (ref >=60)
EGFR CKD-EPI NON-AA MALE: >90 mL/min/{1.73_m2} (ref >=60)
GLUCOSE RANDOM: 103 mg/dL (ref 70–179)
GLUCOSE RANDOM: 116 mg/dL (ref 70–179)
POTASSIUM: 4.2 mmol/L (ref 3.5–5.0)
POTASSIUM: 4.1 mmol/L (ref 3.5–5.0)
SODIUM: 132 mmol/L — ABNORMAL LOW (ref 135–145)
SODIUM: 128 mmol/L — ABNORMAL LOW (ref 135–145)

## 2019-03-05 LAB — HEMOGLOBIN: Hemoglobin:MCnc:Pt:Bld:Qn:: 7.7 — ABNORMAL LOW

## 2019-03-05 LAB — MONOCYTES ABSOLUTE COUNT: Monocytes:NCnc:Pt:Bld:Qn:Automated count: 0 — ABNORMAL LOW

## 2019-03-05 LAB — SMEAR REVIEW

## 2019-03-05 LAB — SODIUM URINE: Lab: <5

## 2019-03-05 LAB — POTASSIUM: Potassium:SCnc:Pt:Ser/Plas:Qn:: 4.2

## 2019-03-05 LAB — ANION GAP: Anion gap 3:SCnc:Pt:Ser/Plas:Qn:: 0

## 2019-03-05 LAB — POSACONAZOLE LEVEL: Lab: 1177

## 2019-03-05 LAB — OSMOLALITY MEASURED: Osmolality:Osmol:Pt:Ser/Plas:Qn:: 275

## 2019-03-05 LAB — OSMOLALITY URINE: Lab: 479

## 2019-03-05 LAB — EGFR CKD-EPI NON-AA MALE: Lab: >90

## 2019-03-05 NOTE — Unmapped (Signed)
Med E1 Inpatient Service  Daily Progress Note     Interval/Overnight/Subjective: Afebrile overnight, good O2 sats on HFNC 40% FiO2. Complained of increased thirst yesterday, ~2L PO fluid intake. Remains edematous, and diuresing with Lasix. Hyponatremic, w/o AMS, Uosm: 275. ECHO: LV normal size, EF: 45%. Has not had BM in interim, denies abdominal pain. GOC discussion ongoing with sister [name]. Patient has had decrease in functional status since Chemo and lives alone.      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 man with a history of COPD and recently diagnosed AML with course complicated by treatment related pancytopenia and GI bleed who presents as a transfer from [facility] for management of AHRF and elevated LFTs, now with fevers and rash as well.    Acute on Chronic Hypoxic Respiratory Failure - Concern for PNA - Volume Overload:  Differential includes PNA vs COPD exacerbation vs. Volume overload, most likely multifactorial. Per sister presented with 1 day of dyspnea, stopped wearing his home O2, and no pulse ox at home. At baseline on 2L of supplemental oxygen at home. Found to be hypoxic at OSH requiring HFNC. CXR with evidence of pulmonary edema and trace left pleural effusion. Evening of transfer developed fever to 38.C raising concern for PNA. History of COPD having recently completed an extended steroid taper for exacerbation on prior admission and has poor airflow on exam. Also with 3+ pitting edema of the lower extremities suggesting component of volume overload and pro-BNP of 4240 compared to 88-100 on prior labs. Respiratory Pathogen Panel, SARS-CoV-2 PCR negative  - Continue treatment for CAP (cefepime 10/26 -), will hold atypical cvg given negative RPP - Prednisone 40 mg for COPD exacerbation (10/26 - 30)  - Scheduled duonebs   - CT chest noncon to further evaluate potential PNA  - Goal to diuresis -1 L daily however currently limited by low Bps, will diurese as blood pressure allows   - Wean HFNC as tolerated    Fevers - Most likely due to PNA, fevered to 39.4 on 10/26 then slowly defervesced throughout day. UA unremarkable, Blood cultures drawn and cultures from OSH NG x 24 hours after calling micro to confirm  -Cefepime (10/26 - )  -CT chest as above  -F/u OSH BC from 10/24 (NG x 3 days) and in house BCx 10/26    AMS/Left facial droop Patient developed acute-onset left-sided facial droop and LUE weakness on day of admission 10/27. Evaluated by Neurology NIHSS 8 initially, CTH negative. 24 hours of video EEG completed and without focal seziures, only showed difuse slowing. Neurology signed off. Droop largely resolved and strength equal on exam today. Mental status seems at baseline (hx of intellectual disability).    Elevated LFTs: Characterized by mild hyperbilirubinemia and transaminase elevations.TBili first noted to be elevated on 10/20, AST/ALT/AlkPhos elevation since 10/21. Likely drug induced. Could be related to azacitidine which can cause transient elevation of liver enzymes. Also discharged on amicare which may have exacerbated transaminitis. Hep B/C and HIV were negative in 11/2017. Most recent abdominal imaging (CT) showed hepatosplenomegaly but no evidence of cirrhosis. RUQ [place] without biliary duct dilation, showed mild hepatomegaly, s/p cholecystectomy.  -Daily CMP  -Holding chemo and amicar, monitor for improvement  Ileus: Dilated loops of bowel on KUB concerning for partial SBO vs ileus after transient report of abdominal pain. Pt with history of going through cycles of constipation followed by diarrhea. Sister reports he did have one dark (chronic melena) BM on 10/25 prior to presentation to hospital. No BMs in house but passing flatus, has bowel sounds, and exam benign.  -Miralax TID  -Senna BID  -1x lactulose    Diffuse Rash - Facial edema - suspect mild drug allergy to vanc vs. Amicar. Swelling and itching improved with topical steroid cream, prednisone for COPD exacerbation, and 1x benadryl. Rash still present with diffuse erythematous macules on trunk. Circumscribed lesions on back likely psoriatic given hx of psoriasis per chart review.  -Avoid vanc for now  -Supportive care with topical crema  -WOCN consulting  -Consider derm consult if does not improve    L eye swelling/drainage - Purulent drainage on admission  -Polymixin eye drops for bacterial conjuncitvitis     Adverse Risk AML: Follows with Dr. Vertell Limber. BMBx on 01/15/2019 was hypercellular (70%) with 30% blasts. He has complex cytogenetics and TP53 mutation. He was started on Azacitdine/Venetoclax, C1D1 = 10/6. Course complicated by pancytopenia and GI bleeding. He has a poor prognosis with and without chemo. Began to discuss pt's goals of care with pt and sister today- pt with poor understanding of risks vs. Benefits of chemo and only short term outlook I want to go home. Sister to discuss with her other sister  -Continue goals of care discussions re: risks vs. Benefits of continuing chemo given multiple recent hospitalizations and poor prognosis  -Holding chemo for now in setting of infection    Chronic Medical Problems:  HTN: Holding amlodipine 10 mg daily  CAD: Stents last placed on 02/2016 with reported EF of 50%. Most recent heart cath on 01/2018 was unremarkable. Holding metoprolol, IMDUR, PRN diureses with furosemide depending on volume status. COPD: Continue duonebs  Chronic Pain: Continue home pain regimen      Daily Checklist:  Access: PIV  Diet:??Regular  Electrolytes:??Replete PRN  DVT PPx: Contraindicated due to thrombocytopenia  GI PPx: Not Indicated  Code Status: Full Code  Dispo:??Stepdown, Med E1  _______________________________________________________________    Objective:     Vital Signs:  Temp:  [36.4 ??C-37.3 ??C] 36.7 ??C  Heart Rate:  [66-86] 75  SpO2 Pulse:  [72-84] 75  Resp:  [18-27] 21  BP: (95-121)/(38-58) 119/51  MAP (mmHg):  [58-69] 58  FiO2 (%):  [40 %-50 %] 50 %  SpO2:  [95 %-99 %] 96 %    Physical Exam:  General: Chronically ill-appearing male, NAD  HEENT: Head: normocephalic, atraumatic. Left eyelid swollen, eye with less drainage and able to open easier compared to day prior.   Neck: difficult to appreciate JVD due to body habitus   Chest/Lungs: Comfortable-appearing on HFNC, faint rhonchi bilaterally  Heart: regular rate rhythm, normal S1, S2, no murmurs, rubs, clicks or gallops  Abdomen: soft, nontender, distended, no masses, normoactive BS  Extremities: bilateral LE edema 3+ pitting to mid shin, continued improvement from prior, [facility]  Skin: interval improvement in urticarial rash  Psychiatric: At baseline    Diagnostic Studies:  I reviewed all pertinent diagnostic studies.  ______________

## 2019-03-05 NOTE — Unmapped (Signed)
Med E1 Inpatient Service  Daily Progress Note     Interval/Overnight/Subjective: First fever overnight to 38.8 C, started on cefepime Overnight pt complained of abdominal pain but was soft, nontender on exam. KUB showed dilated loops of small bowel concerning for ileus vs. Partial SBO. Briefly placed on BiPAP due to concern for difficult arousability but seemed to be alert and interactive after awakening from deep sleep. Did not tolerate biPAP, placed back on HFNC 50%, 60 LPM. ABG with stable chronic respiratory acidosis. Pt was net negative at least 880 ml yesterday after 1 dose 40 mg IV lasix but given 500 ml bolus for soft pressures in 90s-100s systolic. Today pt is more calm and pleasant, reports improvement in itching of his diffuse rash. COVID/RPP Negative this AM in addition to OSH COVID negative.     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 man with a history of COPD and recently diagnosed AML with course complicated by treatment related pancytopenia and GI bleed who presents as a transfer from [facility] for management of AHRF and elevated LFTs, now with fevers and rash as well. Acute on Chronic Hypoxic Respiratory Failure - Concern for PNA - Volume Overload:  Differential includes PNA vs COPD exacerbation vs. Volume overload, most likely multifactorial. Per sister presented with 1 day of dyspnea, stopped wearing his home O2, and no pulse ox at home. At baseline on 2L of supplemental oxygen at home. Found to be hypoxic at OSH requiring HFNC. CXR with evidence of pulmonary edema and trace left pleural effusion. Evening of transfer developed fever to 38.C raising concern for PNA. History of COPD having recently completed an extended steroid taper for exacerbation on prior admission and has poor airflow on exam. Also with 3+ pitting edema of the lower extremities suggesting component of volume overload and pro-BNP of 4240 compared to 88-100 on prior labs. Respiratory Pathogen Panel, SARS-CoV-2 PCR negative  - Continue treatment for CAP (cefepime 10/26 -), will hold atypical cvg given negative RPP  - Prednisone 40 mg for COPD exacerbation (10/26 - 30)  - Scheduled duonebs   - CT chest noncon to further evaluate potential PNA  - Goal to diuresis -1 L daily however currently limited by low Bps, will diurese as blood pressure allows   - Wean HFNC as tolerated    Fevers - Most likely due to PNA, fevered to 39.4 on 10/26 then slowly defervesced throughout day. UA unremarkable, Blood cultures drawn and cultures from OSH NG x 24 hours after calling micro to confirm  -Cefepime (10/26 - )  -CT chest as above  -F/u OSH BC from 10/24 and in house BCx 10/26    AMS/Left facial droop Patient developed acute-onset left-sided facial droop and LUE weakness on day of admission 10/27. Evaluated by Neurology NIHSS 8 initially, CTH negative. 24 hours of video EEG completed and without focal seziures, only showed difuse slowing. Neurology signed off. Droop largely resolved and strength equal on exam today. Mental status seems at baseline (hx of intellectual disability). Elevated LFTs: Characterized by mild hyperbilirubinemia and transaminase elevations.TBili first noted to be elevated on 10/20, AST/ALT/AlkPhos elevation since 10/21. Likely drug induced. Could be related to azacitidine which can cause transient  elevation of liver enzymes. Also discharged on amicare which may have exacerbated transaminitis. Hep B/C and HIV were negative in 11/2017. Most recent abdominal imaging (CT) showed hepatosplenomegaly but no evidence of cirrhosis. RUQ [place] without biliary duct dilation, showed mild hepatomegaly, s/p cholecystectomy.  -Daily CMP  -Holding chemo and amicare, monitor for improvement    Ileus: Dilated loops of bowel on KUB concerning for partial SBO vs ileus after transient report of abdominal pain. Pt with history of going through cycles of constipation followed by diarrhea. Sister reports he did have one dark (chronic melena) BM on 10/25 prior to presentation to hospital. No BMs in house but passing flatus, has bowel sounds, and exam benign.  -Miralax TID  -Senna BID  -1x lactulose    Diffuse Rash - Facial edema - suspect mild drug allergy to vanc vs. Amicare. Swelling and itching improved with topical steroid cream, prednisone for COPD exacerbation, and 1x benadryl. Rash still present with diffuse erythematous macules on trunk. Circumscribed lesions on back likely psoriatic given hx of psoriasis.  -Avoid vanc for now  -Supportive care with topical crema  -[facility] consulting  -Consider derm consult if does not improve    L eye swelling/drainage - Purulent drainage on admission  -Polymixin eye drops for bacterial conjuncitvitis Adverse Risk AML: Follows with Dr. Vertell Limber. BMBx on 01/15/2019 was hypercellular (70%) with 30% blasts. He has complex cytogenetics and TP53 mutation. He was started on Azacitdine/Venetoclax, C1D1 = 10/6. Course complicated by pancytopenia and GI bleeding. He has a poor prognosis with and without chemo. Began to discuss pt's goals of care with pt and sister today- pt with poor understanding of risks vs. Benefits of chemo and only short term outlook I want to go home. Sister to discuss with her other sister  -Continue goals of care discussions re: risks vs. Benefits of continuing chemo given multiple recent hospitalizations and poor prognosis  -Holding chemo for now in setting of infection    Chronic Medical Problems:  HTN: Holding amlodipine 10 mg daily  CAD: Stents last placed on 02/2016 with reported EF of 50%. Most recent heart cath on 01/2018 was unremarkable. Holding metoprolol, IMDUR, PRN diureses with furosemide depending on volume status.  COPD: Continue duonebs  Chronic Pain: Continue home pain regimen      Daily Checklist:  Access: PIV  Diet:??Regular  Electrolytes:??Replete PRN  DVT PPx: Contraindicated due to thrombocytopenia  GI PPx: Not Indicated  Code Status: Full Code  Dispo:??Stepdown, Med E1_______________________________________________________________    Objective:     Vital Signs:  Temp:  [36.6 ??C-37.2 ??C] 37.2 ??C  Heart Rate:  [65-86] 78  SpO2 Pulse:  [65-84] 84  Resp:  [14-27] 27  BP: (95-109)/(39-58) 95/58  MAP (mmHg):  [59-63] 63  FiO2 (%):  [40 %-50 %] 40 %  SpO2:  [95 %-100 %] 95 %    Physical Exam:  General: Chronically ill-appearing male, AOx3, NAD  HEENT: Head: normocephalic, atraumatic. Left eyelid swollen, eye with less drainage and able to open easier compared to day prior.   Neck: difficult to appreciate JVD due to body habitus Chest/Lungs :Appears comfortable on HFNC, poor airflow throughout, faint inspiratory wheezing intermittently, unable to listen posteriorly due to pt report of pain   Heart: regular rate rhythm, normal S1, S2, no murmurs, rubs, clicks or gallops  Abdomen: soft, nontender, distended, no masses, normoactive BS  Extremities:bilateral LE edema 3+ pitting to knee, mildly improved from prior, no clubbing or cyanosis  Skin: erythematous urticarial rash localized to  chest, back, abdomen. Multiple psoriatic lesions, various stages of healing.  Neurological: Strength 5-/5 throughout extremities, facial droop much improved. Tremor of b/l upper extremities no change with movement  Psychiatric: Appears at baseline, pt with developmental delay, repeatedly requesting water and asking when he can ho home     Diagnostic Studies:  I reviewed all pertinent diagnostic studies.  ______________    Alford Highland, MD  Internal Medicine PGY2

## 2019-03-05 NOTE — Unmapped (Signed)
HISTORY   Dawsen Nicholis Stepanek is 63 y.o. years old male with evaluation of seizure or seizure like activity.    PROCDURE  Continuous vide-EEG was performed while awake utilizing 21 active electrodes placed according to the international 10-20 system.  The study was recorded digitally with a bandpass of 1-70Hz  and a sampling rate of 200Hz  and was reviewed with the possibility of multiple reformatting. The study was digitally processed with potential spike and seizure events identified for physician analysis and review.  Patient recognized events were identified by a push button marker and reviewed by the physician.  Background activity was reviewed from the continuously recorded data. Simultaneous video was reviewed for all patient events.    TECHNICAL DESCRIPTION  DETAIL:  Day 1 (03:22 on 03/04/2019 - 06:17 on 03/05/2019)  The best waking record shows a good , fair  organization at rest, consisting of a 20-40 uV 8-9 Hz posterior dominant rhythm with fair  reactivity. There is a moderate bilateral beta activity. There are intermittent bilateral theta > occasional delta slow waves.    Excessive to nearly continuous drowsiness is characterized by attenuation of the background, vertex waves, and bilateral theta and delta slowing. Stage II sleep is characterized by slowing, vertex waves, K-complexes, and symmetric sleep spindles. Slow wave and REM sleep are recorded.    There is no push button clinical event.    IMPRESSION   This continuous EEG with video monitoring over 1 day is abnormal due to mild background slowing    CLINICAL INTERPRETATION  This study is consistent with mild bilateral cerebral dysfunction and encephalopathy.

## 2019-03-05 NOTE — Unmapped (Signed)
[facility] Consult Services  ADVANCED FOOT AND NAIL CARE CONSULT     Reason For Consult:  - Initial  - Nail Care    Assessment:  Received order for [facility] to complete Advanced Foot and Nail Care Service.  Patient reports that he has not been able to independently cut toenails in awhile.    The patient has never been to a Podiatrist.  The Patient is agreeable to [facility] completing nail care.    Nail Assessment:  -  Finger Nail:  slightly long. Toenails are long with curling under.  -  No Ulcerations, foot deformities, or other nail concerns noted.      Procedure:  -  Patient in need of toenail cleaning and cutting.  The patient requested fingernail trimming also.  -  Nails clipped with sterile clippers, then nails filed with nail file.  -  Feet cleaned and moisturizer applied.  -  Patient tolerated the procedure well    Instruments Used:  -  100/180 grit nail file  -  cuticle stick    Plan:  -  Instructed the Patient the risk of ulceration, infection, and trauma with overgrown nails. Reviewed the importance of following up with [facility], Podiatrist or PCP to continue to maintain nail care/length.    Workup Time:  30 minutes

## 2019-03-05 NOTE — Unmapped (Signed)
Pt A&Ox4. VSS on 40% 4L HFNC. Pt stated he was feeling anxious at beginning of shift, hollaring at staff that walked by room, trying to get oob, tremors BUE. MD notified, atarax and melatonin given per order. Adequate uop by voiding, incontinent. No bm this shift. No c/o pain. CT postponed until morning, per md. Continuous eeg monitoring in place. Fall and safety precautions maintained. Will continue to monitor.       Problem: Adult Inpatient Plan of Care  Goal: Plan of Care Review  Outcome: Progressing  Goal: Patient-Specific Goal (Individualization)  Outcome: Progressing  Goal: Absence of Hospital-Acquired Illness or Injury  Outcome: Progressing  Goal: Optimal Comfort and Wellbeing  Outcome: Progressing  Goal: Readiness for Transition of Care  Outcome: Progressing  Goal: Rounds/Family Conference  Outcome: Progressing     Problem: Wound  Goal: Optimal Wound Healing  Outcome: Progressing     Problem: Fall Injury Risk  Goal: Absence of Fall and Fall-Related Injury  Outcome: Progressing     Problem: Skin Injury Risk Increased  Goal: Skin Health and Integrity  Outcome: Progressing     Problem: Respiratory Compromise (Pneumonia)  Goal: Effective Oxygenation and Ventilation  Outcome: Progressing

## 2019-03-05 NOTE — Unmapped (Signed)
Patient was compliant with all inhaled scheduled medications and was under no apparent distress. Currently on High flow Nasal O2.

## 2019-03-05 NOTE — Unmapped (Signed)
Adult Nutrition Assessment Note    Visit Type: RN Consult  Reason for Visit: Per Admission Nutrition Screen (Adult), Have you gained or lost 10 pounds in the past 3 months?, Have you had a decrease in food intake or appetite?    ASSESSMENT:   HPI & PMH: 63 y.o.??man with a history of COPD and recently diagnosed AML with course complicated by treatment related pancytopenia and GI bleed who presents as a transfer from [facility] for management of AHRF and elevated LFTs, now with fevers and rash as well.    Nutrition Hx: RD met with patient this afternoon. Patient appeared very uncomfortable, only providing brief responses to RD questions. Patient reported PO intake has been not much for several weeks PTA, usually only 1 meal/day. Patient endorsed N/V sometimes as well as diarrhea. Patient reported difficulty chewing and swallowing sometimes. Patient does not take vitamins/supplements at home. Patient unsure of UBW but endorsed weight loss.     Nutritionally Pertinent Meds: Lasix, Lactulose, Miralax, Prednisone, Senna     Labs: Na126(L)    Abd/GI: Per XR Abd 10/26, Mildly dilated loops of small bowel within the left and mid lower abdomen may represent ileus versus small bowel obstruction. Mild colonic stool burden. Per chart review, Pt with history of going through cycles of constipation followed by diarrhea. Sister reports he did have one dark (chronic melena) BM on 10/25 prior to presentation to hospital. No BMs in house but passing flatus, has bowel sounds, and exam benign.    Skin:   Patient Lines/Drains/Airways Status    Active Wounds     Name:   Placement date:   Placement time:   Site:   Days:    Surgical Site 03/12/17 Eye Right   03/12/17    1025     723    Wound 03/03/19 Other (comment) Back Right;Upper;Other (Comment)   03/03/19    1402    Back   1               Current nutrition therapy order:   Nutrition Orders          Nutrition Therapy Regular/House starting at 10/27 0930 Anthropometric Data:  -- Height: 170.2 cm (5' 7)   -- Last recorded weight: (!) 105.5 kg (232 lb 9.4 oz)  -- Admission weight: 100.4 kg (221 lb 5.5 oz)  -- IBW: 67.2 kg  -- Percent IBW: 149.4 %  -- BMI: Body mass index is 36.43 kg/m??.   -- Weight changes this admission:   Last 5 Recorded Weights    03/03/19 1300 03/03/19 1901 03/05/19 0346   Weight: 100.4 kg (221 lb 5.5 oz) (!) 103.1 kg (227 lb 4.7 oz) (!) 105.5 kg (232 lb 9.4 oz)      -- Weight history PTA:   Wt Readings from Last 10 Encounters:   03/05/19 (!) 105.5 kg (232 lb 9.4 oz)   02/28/19 (!) 102 kg (224 lb 13.9 oz)   02/24/19 (!) 102.7 kg (226 lb 6.6 oz)   02/20/19 (!) 105.1 kg (231 lb 9.6 oz)   02/15/19 (!) 104.4 kg (230 lb 2.6 oz)   02/10/19 (!) 101.2 kg (223 lb 3.2 oz)   02/01/19 (!) 104.5 kg (230 lb 7.9 oz)   01/30/19 (!) 104.3 kg (230 lb)   01/30/19 (!) 104.4 kg (230 lb 1.6 oz)   01/23/19 (!) 102.4 kg (225 lb 12.8 oz)        Daily Estimated Nutrient Needs:   Energy: 2015-2278 kcals [  Per [place] St-Jeor Equation(MStJx1.15-1.3) using admission body weight, 100.4 kg (03/05/19 1213)]  Protein: 126-142 gm [25% of kcal using admission body weight, 100.4 kg (03/05/19 1213)]  Carbohydrate:   [45-60% of kcal]  Fluid:   [per MD team]     Nutrition Focused Physical Exam:  Unable to complete this visit     DIAGNOSIS:  Malnutrition Assessment using AND/ASPEN Clinical Characteristics:  Pending NFPE     Overall nutrition impression: Concern for ileus vs SBO on imaging. Ensure added per discussion with patient. If patient unable to meet nutrition needs through PO intake, recommend consider temporary nutrition support.     GOALS:  Oral Intake:       - Patient to consume >50% of 3 meals per day.  - Patient to consume >50% of 1-2 oral supplements per day.     RECOMMENDATIONS AND INTERVENTIONS:  -Recommend continue current diet order   -Please monitor PO intake per documentation of % meals in Epic -Please assist patient with ordering meals, setting up meals, and feeding as needed   -Recommend Ensure HP TID   -Please weigh patient weekly     RD Follow Up Parameters:  1-2 times per week (and more frequent as indicated)     Electronically signed by:   Cross-Coverage Dietitian   Tonye Becket, RD, LDN, [facility]  Pager: [phone]

## 2019-03-06 DIAGNOSIS — J810 Acute pulmonary edema: Secondary | ICD-10-CM | POA: Diagnosis not present

## 2019-03-06 DIAGNOSIS — C9200 Acute myeloblastic leukemia, not having achieved remission: Secondary | ICD-10-CM | POA: Diagnosis not present

## 2019-03-06 DIAGNOSIS — D61818 Other pancytopenia: Secondary | ICD-10-CM | POA: Diagnosis not present

## 2019-03-06 DIAGNOSIS — R569 Unspecified convulsions: Secondary | ICD-10-CM | POA: Diagnosis not present

## 2019-03-06 DIAGNOSIS — I4891 Unspecified atrial fibrillation: Secondary | ICD-10-CM | POA: Diagnosis not present

## 2019-03-06 DIAGNOSIS — J9621 Acute and chronic respiratory failure with hypoxia: Secondary | ICD-10-CM | POA: Diagnosis not present

## 2019-03-06 DIAGNOSIS — J189 Pneumonia, unspecified organism: Secondary | ICD-10-CM | POA: Insufficient documentation

## 2019-03-06 DIAGNOSIS — R918 Other nonspecific abnormal finding of lung field: Secondary | ICD-10-CM | POA: Diagnosis not present

## 2019-03-06 LAB — BLOOD GAS CRITICAL CARE PANEL, VENOUS
BASE EXCESS VENOUS: 14.4 — ABNORMAL HIGH (ref -2.0–2.0)
CALCIUM IONIZED VENOUS (MG/DL): 4.39 mg/dL — ABNORMAL LOW (ref 4.40–5.40)
GLUCOSE WHOLE BLOOD: 191 mg/dL — ABNORMAL HIGH (ref 70–179)
HEMOGLOBIN BLOOD GAS: 7.5 g/dL — ABNORMAL LOW (ref 13.50–17.50)
LACTATE BLOOD VENOUS: 1.2 mmol/L (ref 0.5–1.8)
O2 SATURATION VENOUS: 98.3 % — ABNORMAL HIGH (ref 40.0–85.0)
PCO2 VENOUS: 83 mmHg (ref 40–60)
PH VENOUS: 7.31 — ABNORMAL LOW (ref 7.32–7.43)
PO2 VENOUS: 111 mmHg — ABNORMAL HIGH (ref 30–55)
SODIUM WHOLE BLOOD: 133 mmol/L — ABNORMAL LOW (ref 135–145)

## 2019-03-06 LAB — COMPREHENSIVE METABOLIC PANEL
ALBUMIN: 2.4 g/dL — ABNORMAL LOW (ref 3.5–5.0)
ALKALINE PHOSPHATASE: 112 U/L (ref 38–126)
ALKALINE PHOSPHATASE: 138 U/L — ABNORMAL HIGH (ref 38–126)
ALT (SGPT): 69 U/L — ABNORMAL HIGH (ref <50)
ALT (SGPT): 80 U/L — ABNORMAL HIGH (ref <50)
AST (SGOT): 49 U/L (ref 19–55)
AST (SGOT): 81 U/L — ABNORMAL HIGH (ref 19–55)
BILIRUBIN TOTAL: 1.3 mg/dL — ABNORMAL HIGH (ref 0.0–1.2)
BILIRUBIN TOTAL: 1.3 mg/dL — ABNORMAL HIGH (ref 0.0–1.2)
BLOOD UREA NITROGEN: 26 mg/dL — ABNORMAL HIGH (ref 7–21)
BLOOD UREA NITROGEN: 30 mg/dL — ABNORMAL HIGH (ref 7–21)
BUN / CREAT RATIO: 38
BUN / CREAT RATIO: 37
CALCIUM: 7.3 mg/dL — ABNORMAL LOW (ref 8.5–10.2)
CHLORIDE: 87 mmol/L — ABNORMAL LOW (ref 98–107)
CHLORIDE: 83 mmol/L — ABNORMAL LOW (ref 98–107)
CO2: >40 mmol/L (ref 22.0–30.0)
CO2: >40 mmol/L (ref 22.0–30.0)
CREATININE: 0.69 mg/dL — ABNORMAL LOW (ref 0.70–1.30)
CREATININE: 0.82 mg/dL (ref 0.70–1.30)
EGFR CKD-EPI AA MALE: >90 mL/min/{1.73_m2} (ref >=60)
EGFR CKD-EPI AA MALE: >90 mL/min/{1.73_m2} (ref >=60)
EGFR CKD-EPI NON-AA MALE: >90 mL/min/{1.73_m2} (ref >=60)
EGFR CKD-EPI NON-AA MALE: >90 mL/min/{1.73_m2} (ref >=60)
GLUCOSE RANDOM: 104 mg/dL (ref 70–179)
GLUCOSE RANDOM: 178 mg/dL (ref 70–179)
POTASSIUM: 3.6 mmol/L (ref 3.5–5.0)
POTASSIUM: 4 mmol/L (ref 3.5–5.0)
PROTEIN TOTAL: 5.4 g/dL — ABNORMAL LOW (ref 6.5–8.3)
PROTEIN TOTAL: 5.5 g/dL — ABNORMAL LOW (ref 6.5–8.3)
SODIUM: 133 mmol/L — ABNORMAL LOW (ref 135–145)
SODIUM: 130 mmol/L — ABNORMAL LOW (ref 135–145)

## 2019-03-06 LAB — BASIC METABOLIC PANEL
BLOOD UREA NITROGEN: 24 mg/dL — ABNORMAL HIGH (ref 7–21)
BUN / CREAT RATIO: 32
CALCIUM: 7.3 mg/dL — ABNORMAL LOW (ref 8.5–10.2)
CHLORIDE: 83 mmol/L — ABNORMAL LOW (ref 98–107)
CO2: >40 mmol/L (ref 22.0–30.0)
CREATININE: 0.74 mg/dL (ref 0.70–1.30)
EGFR CKD-EPI AA MALE: >90 mL/min/{1.73_m2} (ref >=60)
EGFR CKD-EPI NON-AA MALE: >90 mL/min/{1.73_m2} (ref >=60)
GLUCOSE RANDOM: 109 mg/dL (ref 70–179)
POTASSIUM: 3.4 mmol/L — ABNORMAL LOW (ref 3.5–5.0)

## 2019-03-06 LAB — CBC
HEMATOCRIT: 26.9 % — ABNORMAL LOW (ref 41.0–53.0)
HEMOGLOBIN: 8.5 g/dL — ABNORMAL LOW (ref 13.5–17.5)
MEAN CORPUSCULAR HEMOGLOBIN CONC: 31.8 g/dL (ref 31.0–37.0)
MEAN CORPUSCULAR HEMOGLOBIN: 30.7 pg (ref 26.0–34.0)
MEAN CORPUSCULAR VOLUME: 96.5 fL (ref 80.0–100.0)
MEAN PLATELET VOLUME: 10.2 fL — ABNORMAL HIGH (ref 7.0–10.0)
NUCLEATED RED BLOOD CELLS: 11 /100{WBCs} — ABNORMAL HIGH (ref <=4)
PLATELET COUNT: 44 10*9/L — ABNORMAL LOW (ref 150–440)
RED BLOOD CELL COUNT: 2.78 10*12/L — ABNORMAL LOW (ref 4.50–5.90)
RED CELL DISTRIBUTION WIDTH: 17.5 % — ABNORMAL HIGH (ref 12.0–15.0)

## 2019-03-06 LAB — CBC W/ AUTO DIFF
BASOPHILS ABSOLUTE COUNT: 0 10*9/L (ref 0.0–0.1)
BASOPHILS RELATIVE PERCENT: 5.2 %
EOSINOPHILS ABSOLUTE COUNT: 0 10*9/L (ref 0.0–0.4)
EOSINOPHILS RELATIVE PERCENT: 2.3 %
HEMATOCRIT: 24.1 % — ABNORMAL LOW (ref 41.0–53.0)
HEMOGLOBIN: 7.6 g/dL — ABNORMAL LOW (ref 13.5–17.5)
LARGE UNSTAINED CELLS: 9 % — ABNORMAL HIGH (ref 0–4)
LYMPHOCYTES ABSOLUTE COUNT: 0.2 10*9/L — ABNORMAL LOW (ref 1.5–5.0)
MEAN CORPUSCULAR HEMOGLOBIN CONC: 31.7 g/dL (ref 31.0–37.0)
MEAN CORPUSCULAR HEMOGLOBIN: 30.1 pg (ref 26.0–34.0)
MEAN CORPUSCULAR VOLUME: 95.2 fL (ref 80.0–100.0)
MEAN PLATELET VOLUME: 9.9 fL (ref 7.0–10.0)
MONOCYTES ABSOLUTE COUNT: 0 10*9/L — ABNORMAL LOW (ref 0.2–0.8)
MONOCYTES RELATIVE PERCENT: 8.7 %
NEUTROPHILS ABSOLUTE COUNT: 0.1 10*9/L — CL (ref 2.0–7.5)
PLATELET COUNT: 44 10*9/L — ABNORMAL LOW (ref 150–440)
RED BLOOD CELL COUNT: 2.54 10*12/L — ABNORMAL LOW (ref 4.50–5.90)
RED CELL DISTRIBUTION WIDTH: 17 % — ABNORMAL HIGH (ref 12.0–15.0)
WBC ADJUSTED: 0.3 10*9/L — CL (ref 4.5–11.0)

## 2019-03-06 LAB — CHLORIDE: Chloride:SCnc:Pt:Ser/Plas:Qn:: 83 — ABNORMAL LOW

## 2019-03-06 LAB — BLOOD GAS, ARTERIAL
BASE EXCESS ARTERIAL: 16.9 — ABNORMAL HIGH (ref -2.0–2.0)
O2 SATURATION ARTERIAL: 95.5 % (ref 94.0–100.0)
PCO2 ARTERIAL: 74.8 mmHg (ref 35.0–45.0)
PO2 ARTERIAL: 77.2 mmHg — ABNORMAL LOW (ref 80.0–110.0)

## 2019-03-06 LAB — THYROID STIMULATING HORMONE: Thyrotropin:ACnc:Pt:Ser/Plas:Qn:: 0.248 — ABNORMAL LOW

## 2019-03-06 LAB — PRO-BNP: Natriuretic peptide.B prohormone N-Terminal:MCnc:Pt:Ser/Plas:Qn:: 6000 — ABNORMAL HIGH

## 2019-03-06 LAB — ANISOCYTOSIS

## 2019-03-06 LAB — SODIUM WHOLE BLOOD: Sodium:SCnc:Pt:Bld:Qn:: 133 — ABNORMAL LOW

## 2019-03-06 LAB — BILIRUBIN TOTAL: Bilirubin:MCnc:Pt:Ser/Plas:Qn:: 1.3 — ABNORMAL HIGH

## 2019-03-06 LAB — BASE EXCESS ARTERIAL: Base excess:SCnc:Pt:BldA:Qn:Calculated: 16.9 — ABNORMAL HIGH

## 2019-03-06 LAB — MAGNESIUM
Magnesium:MCnc:Pt:Ser/Plas:Qn:: 2.1
Magnesium:MCnc:Pt:Ser/Plas:Qn:: 2.2

## 2019-03-06 LAB — PROTIME: Coagulation tissue factor induced:Time:Pt:PPP:Qn:Coag: 14.7 — ABNORMAL HIGH

## 2019-03-06 LAB — BLOOD UREA NITROGEN: Urea nitrogen:MCnc:Pt:Ser/Plas:Qn:: 24 — ABNORMAL HIGH

## 2019-03-06 LAB — T3 TOTAL: Triiodothyronine:MCnc:Pt:Ser/Plas:Qn:: 0.6 — ABNORMAL LOW

## 2019-03-06 LAB — TROPONIN I
TROPONIN I: <0.034 ng/mL (ref <0.034)
Troponin I.cardiac:MCnc:Pt:Ser/Plas:Qn:: <0.034

## 2019-03-06 LAB — NUCLEATED RED BLOOD CELLS: Lab: 11 — ABNORMAL HIGH

## 2019-03-06 LAB — FREE T4: Thyroxine.free:MCnc:Pt:Ser/Plas:Qn:: 0.99

## 2019-03-06 NOTE — Unmapped (Signed)
Pt A&Ox4. VSS on 3LNC from 5L [facility]. No c/o pain. Adequate uop to foley, no bm this shift. Continuous EEG monitoring in place. Q2 turns, fall and safety precautions maintained. Will continue to monitor.       Problem: Adult Inpatient Plan of Care  Goal: Plan of Care Review  Outcome: Progressing  Goal: Patient-Specific Goal (Individualization)  Outcome: Progressing  Goal: Absence of Hospital-Acquired Illness or Injury  Outcome: Progressing  Goal: Optimal Comfort and Wellbeing  Outcome: Progressing  Goal: Readiness for Transition of Care  Outcome: Progressing  Goal: Rounds/Family Conference  Outcome: Progressing     Problem: Wound  Goal: Optimal Wound Healing  Outcome: Progressing     Problem: Fall Injury Risk  Goal: Absence of Fall and Fall-Related Injury  Outcome: Progressing     Problem: Skin Injury Risk Increased  Goal: Skin Health and Integrity  Outcome: Progressing     Problem: Respiratory Compromise (Pneumonia)  Goal: Effective Oxygenation and Ventilation  Outcome: Progressing

## 2019-03-06 NOTE — Unmapped (Signed)
Pt is A&Ox4. VSS. Weaned O2 down to 3L [place], sats >88%. Afebrile. No c/o of pain. UO adequate, foley with adequate measured output. No BM. Pt's appetite has been fair, 50% of breakfast, followed with nausea, gave prn zofran, with relief. MAD on sacrum, rash on chest and back. Q2H turns and standard precautions maintained. No falls/injuries this shift. Pt transferred to [facility] around 1330, sent with all belongings including cell phone, electric razor, and clothes.     Problem: Adult Inpatient Plan of Care  Goal: Plan of Care Review  Outcome: Progressing  Goal: Patient-Specific Goal (Individualization)  Outcome: Progressing  Goal: Absence of Hospital-Acquired Illness or Injury  Outcome: Progressing  Goal: Optimal Comfort and Wellbeing  Outcome: Progressing  Goal: Readiness for Transition of Care  Outcome: Progressing  Goal: Rounds/Family Conference  Outcome: Progressing     Problem: Wound  Goal: Optimal Wound Healing  Outcome: Progressing     Problem: Fall Injury Risk  Goal: Absence of Fall and Fall-Related Injury  Outcome: Progressing     Problem: Skin Injury Risk Increased  Goal: Skin Health and Integrity  Outcome: Progressing     Problem: Respiratory Compromise (Pneumonia)  Goal: Effective Oxygenation and Ventilation  Outcome: Progressing     Problem: Hypertension Comorbidity  Goal: Blood Pressure in Desired Range  Outcome: Progressing     Problem: Self-Care Deficit  Goal: Improved Ability to Complete Activities of Daily Living  Outcome: Progressing     Problem: Infection  Goal: Infection Symptom Resolution  Outcome: Progressing

## 2019-03-06 NOTE — Unmapped (Addendum)
HISTORY   Katherine Cloyde Oregel is 63 y.o. years old male with evaluation of seizure or seizure like activity.    PROCDURE  Continuous vide-EEG was performed while awake utilizing 21 active electrodes placed according to the international 10-20 system.  The study was recorded digitally with a bandpass of 1-70Hz  and a sampling rate of 200Hz  and was reviewed with the possibility of multiple reformatting. The study was digitally processed with potential spike and seizure events identified for physician analysis and review.  Patient recognized events were identified by a push button marker and reviewed by the physician.  Background activity was reviewed from the continuously recorded data. Simultaneous video was reviewed for all patient events.    TECHNICAL DESCRIPTION  DETAIL:  Day 1 (03:22 on 03/04/2019 - 06:17 on 03/05/2019)  The best waking record shows a good , fair  organization at rest, consisting of a 20-40 uV 8-9 Hz posterior dominant rhythm with fair  reactivity. There is a moderate bilateral beta activity. There are intermittent bilateral theta > occasional delta slow waves.    Excessive to nearly continuous drowsiness is characterized by attenuation of the background, vertex waves, and bilateral theta and delta slowing. Stage II sleep is characterized by slowing, vertex waves, K-complexes, and symmetric sleep spindles. Slow wave and REM sleep are recorded.    There is no push button clinical event.    Day 2 (06:17 on 03/05/2019 - 09:14 on 03/06/2019)  The record shows similar findings to the previous day's recording. There is no push button clinical event.     IMPRESSION   This continuous EEG with video monitoring over 2 days  is abnormal due to mild background slowing    CLINICAL INTERPRETATION  This study is consistent with mild bilateral cerebral dysfunction and encephalopathy.

## 2019-03-06 NOTE — Unmapped (Signed)
Pt is A&Ox4. VSS. Weaned O2  down to 5L [facility], sats >88%. Afebrile. No c/o of pain. UO adequate, multiple occurences, place foley today, adequate measured output. Continuously stooling this shift, incont, MD notified, modified bowel regimen. Pt's appetite has been poor, continuously offering food, pt declined. MAD on sacrum,. Q2H turns and standard precautions maintained. No falls/injuries this shift. Went to CT today. Gave 1 unit RBC, pt tolerated well, no complications. All monitors with appropriate alarm settings, will continue to monitor patient.       Problem: Adult Inpatient Plan of Care  Goal: Plan of Care Review  Outcome: Progressing  Goal: Patient-Specific Goal (Individualization)  Outcome: Progressing  Goal: Absence of Hospital-Acquired Illness or Injury  Outcome: Progressing  Goal: Optimal Comfort and Wellbeing  Outcome: Progressing  Goal: Readiness for Transition of Care  Outcome: Progressing  Goal: Rounds/Family Conference  Outcome: Progressing     Problem: Wound  Goal: Optimal Wound Healing  Outcome: Progressing     Problem: Fall Injury Risk  Goal: Absence of Fall and Fall-Related Injury  Outcome: Progressing     Problem: Skin Injury Risk Increased  Goal: Skin Health and Integrity  Outcome: Progressing     Problem: Respiratory Compromise (Pneumonia)  Goal: Effective Oxygenation and Ventilation  Outcome: Progressing     Problem: Hypertension Comorbidity  Goal: Blood Pressure in Desired Range  Outcome: Progressing     Problem: Self-Care Deficit  Goal: Improved Ability to Complete Activities of Daily Living  Outcome: Progressing     Problem: Infection  Goal: Infection Symptom Resolution  Outcome: Progressing

## 2019-03-06 NOTE — Unmapped (Signed)
Med E1 Inpatient Service  Daily Progress Note     Interval/Overnight/Subjective: Patient improved from respiratory standpoint overnight, weaned to 3 l [place] from lbnc. Good UOP with lasix by foley. Multiple bowel movements with bowel regimen. Continued shaking of upper extremities this morning. EEG without evidence of acute event, discontinued. Transitioned antibiotics from Cefepime to Zosyn d/t c/f potential toxicity.     Transferred to BMTU in afternoon, but with increasing oxygen requirements to 6L [place] (88-91%). Started on Ipratropium neb. CXR w/ e/o increased pulmonary edema from prior. Diuresed with 40 mg lasix (previously -902 vs 40 mg Lasix). Afib noted in afternoon with sustained HR 180s. Given PO metoprolol 12.5 mg and IV metoprolol 5 mg x 2 with decrease in HR to 130s. Given IV metop 5 mg x 1 prior to transfer to [facility]. Plan for BMBx tomorrow, patient's Venetoclax to bedside today with plan to restart.    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 man with a history of COPD and recently diagnosed AML with course complicated by treatment related pancytopenia and GI bleed who presents as a transfer from [facility] for management of AHRF and elevated LFTs. Fevers and rash resolving. Increased O2 requirements c/f PNA/volume overload/COPD exacerbation also improving with diuresis, antibiotics and prednisone. Acute on Chronic Hypoxic Respiratory Failure - Concern for PNA - Volume Overload:  Differential includes PNA vs COPD exacerbation vs. Volume overload, most likely multifactorial. Per sister presented with 1 day of dyspnea, stopped wearing his home O2, and no pulse ox at home. At baseline on 2L of supplemental oxygen at home. Found to be hypoxic at OSH requiring HFNC. CXR with evidence of pulmonary edema and trace left pleural effusion. Evening of transfer developed fever to 38.C raising concern for PNA. History of COPD having recently completed an extended steroid taper for exacerbation on prior admission and has poor airflow on exam. Also with 3+ pitting edema of the lower extremities suggesting component of volume overload and pro-BNP of 4240 compared to 88-100 on prior labs. Respiratory Pathogen Panel, SARS-CoV-2 PCR negative  - Continue treatment for CAP (cefepime 10/26 -), will hold atypical cvg given negative RPP  - Prednisone 40 mg for COPD exacerbation (10/26 - 30)  - Scheduled Duonebs  - CT chest noncon to further evaluate potential PNA  - Goal to diuresis -1 L daily however currently limited by low Bps, will diurese as blood pressure allows   - Wean oxygen as tolerated    Fevers (resolved) - Most likely due to PNA, fevered to 39.4 on 10/26 then slowly defervesced throughout day. UA unremarkable, Blood cultures drawn and cultures from OSH NG x 24 hours after calling micro to confirm  -Cefepime (10/26 - 10/29)  -Start Zosyn (extended dosing) (10/29)  -CT chest as above  -F/u OSH BC from 10/24 (NG x 4 days) on 10/29 and in house BCx NGTD AMS/Left facial droop Patient developed acute-onset left-sided facial droop and LUE weakness on day of admission 10/27. Evaluated by Neurology NIHSS 8 initially, CTH negative. 24 hours of video EEG completed and without focal seziures, only showed difuse slowing. Neurology signed off. Droop largely resolved and strength equal on exam today. Mental status seems at baseline (hx of intellectual  disability). EEG w/o e/o acute event, discontinued. Continued UE shaking.  - Stop Cefepime    Elevated LFTs: Characterized by mild hyperbilirubinemia and transaminase elevations.TBili first noted to be elevated on 10/20, AST/ALT/AlkPhos elevation since 10/21. Likely drug induced. Could be related to azacitidine which can cause transient elevation of liver enzymes. Also discharged on amicare which may have exacerbated transaminitis. Hep B/C and HIV were negative in 11/2017. Most recent abdominal imaging (CT) showed hepatosplenomegaly but no evidence of cirrhosis. RUQ [place] without biliary duct dilation, showed mild hepatomegaly, s/p cholecystectomy. Liver function labs downtrending.  -Daily CMP  -Holding chemo and amicar, monitor for improvement    Ileus: Dilated loops of bowel on KUB concerning for partial SBO vs ileus after transient report of abdominal pain. Pt with history of going through cycles of constipation followed by diarrhea. Sister reports he did have one dark (chronic melena) BM on 10/25 prior to presentation to hospital. No BMs in house but passing flatus, has bowel sounds, and exam benign. Resolved with bowel regimen.  -Miralax TID  -Senna BID  -1x lactulose    Diffuse Rash - Facial edema (resolving)- suspect mild drug allergy to vanc vs. Amicar. Swelling and itching improved with topical steroid cream, prednisone for COPD exacerbation, and 1x benadryl. Rash still present with diffuse erythematous macules on trunk. Circumscribed lesions on back likely psoriatic given hx of psoriasis per chart review. -Avoid vanc for now  -Supportive care with topical crema  -WOCN consulting  -Consider derm consult if does not improve    L eye swelling/drainage (Stable)- Purulent drainage on admission  -Polymixin eye drops for bacterial conjuncitvitis     Adverse Risk AML: Follows with Dr. Vertell Limber. BMBx on 01/15/2019 was hypercellular (70%) with 30% blasts. He has complex cytogenetics and TP53 mutation. He was started on Azacitdine/Venetoclax, C1D1 = 10/6. Course complicated by pancytopenia and GI bleeding. He has a poor prognosis with and without chemo. Began to discuss pt's goals of care with pt and sister today- pt with poor understanding of risks vs. Benefits of chemo and only short term outlook I want to go home. Sister to discuss with her other sister  -Continue goals of care discussions re: risks vs. Benefits of continuing chemo given multiple recent hospitalizations and poor prognosis  -Holding chemo for now in setting of infection  -Plan to restart today    Chronic Medical Problems:  HTN: Holding amlodipine 10 mg daily  CAD: Stents last placed on 02/2016 with reported EF of 50%. Most recent heart cath on 01/2018 was unremarkable. Holding metoprolol, IMDUR, PRN diureses with furosemide depending on volume status.  COPD: Continue duonebs  Chronic Pain: Continue home pain regimen      Daily Checklist:  Access: PIV  Diet:??Regular  Electrolytes:??Replete PRN  DVT PPx: Contraindicated due to thrombocytopenia  GI PPx: Not Indicated  Code Status: Full Code  Dispo:??Stepdown, Med E1  _______________________________________________________________    Objective:     Vital Signs:  Temp:  [36.4 ??C-37.8 ??C] 37.2 ??C  Heart Rate:  [69-97] 97  SpO2 Pulse:  [68-81] 81  Resp:  [19-28] 27  BP: (109-149)/(36-63) 149/58  MAP (mmHg):  [62-87] 85  FiO2 (%):  [35 %] 35 %  SpO2:  [93 %-100 %] 97 %    Physical Exam:  General: Chronically ill-appearing male, NAD HEENT: Head: normocephalic, atraumatic. Left eyelid swollen, eye with less drainage and able to open easier compared to day prior. Eyelid erythematous.  Neck: difficult to appreciate JVD due to body habitus. Rash  R side of neck improving.  Chest/Lungs: Comfortable-appearing on HFNC, faint rhonchi bilaterally  Heart: regular rate rhythm in AM, normal S1, S2, no murmurs, rubs, clicks or gallops  Abdomen: soft, nontender, distended, no masses, normoactive BS  Extremities: bilateral LE edema 2+ pitting to knee, WWP  Skin: interval improvement in urticarial rash  Psychiatric: At baseline    Diagnostic Studies:  I reviewed all pertinent diagnostic studies.  ______________

## 2019-03-06 NOTE — Unmapped (Signed)
Azole Antifungal Therapeutic Monitoring Pharmacy Note    Raymond [name] is a 63 y.o. male continuing posaconazole.     Indication: Fungal prophylaxis after chemotherapy    Prior Dosing Information: Current regimen posaconazole 300 mg PO daily    Goals:  Therapeutic Drug Levels  Trough level: >700 ng/mL    Additional Clinical Monitoring/Outcomes  Monitor QTc, renal function (SCr and UOP), and liver function (LFTs)    Results:  Posaconazole level: 1177 ng/mL, drawn appropriately    Wt Readings from Last 3 Encounters:   03/06/19 100 kg (220 lb 7.4 oz)   02/28/19 (!) 102 kg (224 lb 13.9 oz)   02/24/19 (!) 102.7 kg (226 lb 6.6 oz)     Lab Results   Component Value Date    BILITOT 1.3 (H) 03/06/2019    ALBUMIN 2.4 (L) 03/06/2019    ALT 69 (H) 03/06/2019    AST 49 03/06/2019       Pharmacokinetic Considerations and Significant Drug Interactions:  ? Concurrent hepatotoxic medications: None identified  ? Concurrent QTc-prolonging medications: citalopram, ondansetron  ? Concurrent CYP3A4 substrates/inhibitors: venetoclax    Assessment/Plan:  Recommendation(s)  ? Continue current regimen of posaconazole 300 mg PO daily    Follow-up  ? No further levels indicated at this time.   ? A pharmacist will continue to monitor and recommend levels as appropriate    Please page service pharmacist with questions/clarifications.    Einar Crow, PharmD Candidate     Okey Regal, PharmD  PGY2 Oncology Pharmacy Resident

## 2019-03-07 ENCOUNTER — Ambulatory Visit: Admit: 2019-03-07 | Discharge: 2019-03-07 | Payer: MEDICARE

## 2019-03-07 ENCOUNTER — Ambulatory Visit: Admit: 2019-03-07 | Discharge: 2019-03-07 | Payer: MEDICARE | Attending: Adult Health | Primary: Adult Health

## 2019-03-07 DIAGNOSIS — I4891 Unspecified atrial fibrillation: Secondary | ICD-10-CM | POA: Diagnosis not present

## 2019-03-07 DIAGNOSIS — C9200 Acute myeloblastic leukemia, not having achieved remission: Secondary | ICD-10-CM | POA: Diagnosis not present

## 2019-03-07 DIAGNOSIS — J9621 Acute and chronic respiratory failure with hypoxia: Secondary | ICD-10-CM | POA: Diagnosis not present

## 2019-03-07 DIAGNOSIS — D61818 Other pancytopenia: Secondary | ICD-10-CM | POA: Diagnosis not present

## 2019-03-07 LAB — BASIC METABOLIC PANEL
BLOOD UREA NITROGEN: 32 mg/dL — ABNORMAL HIGH (ref 7–21)
BUN / CREAT RATIO: 40
CALCIUM: 7.3 mg/dL — ABNORMAL LOW (ref 8.5–10.2)
CHLORIDE: 79 mmol/L — ABNORMAL LOW (ref 98–107)
CO2: >40 mmol/L (ref 22.0–30.0)
EGFR CKD-EPI AA MALE: >90 mL/min/{1.73_m2} (ref >=60)
EGFR CKD-EPI NON-AA MALE: >90 mL/min/{1.73_m2} (ref >=60)
GLUCOSE RANDOM: 199 mg/dL — ABNORMAL HIGH (ref 70–179)
POTASSIUM: 3.5 mmol/L (ref 3.5–5.0)

## 2019-03-07 LAB — CBC W/ AUTO DIFF
BASOPHILS ABSOLUTE COUNT: 0 10*9/L (ref 0.0–0.1)
BASOPHILS RELATIVE PERCENT: 5.4 %
EOSINOPHILS ABSOLUTE COUNT: 0 10*9/L (ref 0.0–0.4)
EOSINOPHILS RELATIVE PERCENT: 4.7 %
HEMATOCRIT: 24.1 % — ABNORMAL LOW (ref 41.0–53.0)
HEMOGLOBIN: 7.7 g/dL — ABNORMAL LOW (ref 13.5–17.5)
LARGE UNSTAINED CELLS: 13 % — ABNORMAL HIGH (ref 0–4)
LYMPHOCYTES ABSOLUTE COUNT: 0.1 10*9/L — ABNORMAL LOW (ref 1.5–5.0)
LYMPHOCYTES RELATIVE PERCENT: 42.4 %
MEAN CORPUSCULAR HEMOGLOBIN CONC: 31.8 g/dL (ref 31.0–37.0)
MEAN CORPUSCULAR VOLUME: 96.8 fL (ref 80.0–100.0)
MEAN PLATELET VOLUME: 10.4 fL — ABNORMAL HIGH (ref 7.0–10.0)
MONOCYTES ABSOLUTE COUNT: 0 10*9/L — ABNORMAL LOW (ref 0.2–0.8)
MONOCYTES RELATIVE PERCENT: 7.6 %
NEUTROPHILS ABSOLUTE COUNT: 0.1 10*9/L — CL (ref 2.0–7.5)
NEUTROPHILS RELATIVE PERCENT: 32.6 %
NUCLEATED RED BLOOD CELLS: 7 /100{WBCs} — ABNORMAL HIGH (ref <=4)
PLATELET COUNT: 58 10*9/L — ABNORMAL LOW (ref 150–440)
RED BLOOD CELL COUNT: 2.48 10*12/L — ABNORMAL LOW (ref 4.50–5.90)
RED CELL DISTRIBUTION WIDTH: 17.4 % — ABNORMAL HIGH (ref 12.0–15.0)
WBC ADJUSTED: 0.2 10*9/L — CL (ref 4.5–11.0)

## 2019-03-07 LAB — CREATININE
Creatinine:MCnc:Pt:Ser/Plas:Qn:: 0.81
Creatinine:MCnc:Pt:Ser/Plas:Qn:: 0.91

## 2019-03-07 LAB — COMPREHENSIVE METABOLIC PANEL
ALBUMIN: 2.3 g/dL — ABNORMAL LOW (ref 3.5–5.0)
ALKALINE PHOSPHATASE: 132 U/L — ABNORMAL HIGH (ref 38–126)
ALT (SGPT): 77 U/L — ABNORMAL HIGH (ref <50)
AST (SGOT): 69 U/L — ABNORMAL HIGH (ref 19–55)
BLOOD UREA NITROGEN: 33 mg/dL — ABNORMAL HIGH (ref 7–21)
BUN / CREAT RATIO: 36
CALCIUM: 7.6 mg/dL — ABNORMAL LOW (ref 8.5–10.2)
CHLORIDE: 83 mmol/L — ABNORMAL LOW (ref 98–107)
CO2: >40 mmol/L (ref 22.0–30.0)
CREATININE: 0.91 mg/dL (ref 0.70–1.30)
EGFR CKD-EPI AA MALE: >90 mL/min/{1.73_m2} (ref >=60)
EGFR CKD-EPI NON-AA MALE: 90 mL/min/{1.73_m2} (ref >=60)
GLUCOSE RANDOM: 119 mg/dL (ref 70–179)
POTASSIUM: 4.2 mmol/L (ref 3.5–5.0)
SODIUM: 133 mmol/L — ABNORMAL LOW (ref 135–145)

## 2019-03-07 LAB — BLOOD GAS, VENOUS
BASE EXCESS VENOUS: 19.4 — ABNORMAL HIGH (ref -2.0–2.0)
BASE EXCESS VENOUS: 19.4 — ABNORMAL HIGH (ref -2.0–2.0)
HCO3 VENOUS: 46 mmol/L — ABNORMAL HIGH (ref 22–27)
HCO3 VENOUS: 48 mmol/L — ABNORMAL HIGH (ref 22–27)
O2 SATURATION VENOUS: 79.2 % (ref 40.0–85.0)
O2 SATURATION VENOUS: 36.2 % — ABNORMAL LOW (ref 40.0–85.0)
PCO2 VENOUS: 80 mmHg (ref 40–60)
PH VENOUS: 7.38 (ref 7.32–7.43)
PH VENOUS: 7.35 (ref 7.32–7.43)
PO2 VENOUS: 57 mmHg — ABNORMAL HIGH (ref 30–55)
PO2 VENOUS: 42 mmHg (ref 30–55)
PO2 VENOUS: 22 mmHg — ABNORMAL LOW (ref 30–55)

## 2019-03-07 LAB — O2 SATURATION VENOUS
Oxygen saturation:MFr:Pt:BldV:Qn:: 36.2 — ABNORMAL LOW
Oxygen saturation:MFr:Pt:BldV:Qn:: 88.3 — ABNORMAL HIGH

## 2019-03-07 LAB — ANISOCYTOSIS

## 2019-03-07 LAB — TOXIC GRANULATION

## 2019-03-07 LAB — FIO2 VENOUS

## 2019-03-07 LAB — SLIDE REVIEW

## 2019-03-07 LAB — MAGNESIUM: Magnesium:MCnc:Pt:Ser/Plas:Qn:: 2.2

## 2019-03-07 NOTE — Unmapped (Signed)
Med E1 Inpatient Service  Daily Progress Note     Interval/Overnight/Subjective:  Patient transferred to [facility] after RR for Afib RVR, conversion to NSR with metoprolol in evening. Continued elevated pCO2 (90->80s) on BiPAP. Weaned to 2l [place] in afternoon with continued diuresis, DuoNebs, Zosyn. Remains afebrile. 7x stools yesterday on bowel regimen. Denies abdominal pain. Continued bilateral UE shaking, gradually improving.     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 man with a history of COPD and recently diagnosed AML with course complicated by treatment related pancytopenia and GI bleed who presents as a transfer from [facility] for management of AHRF and elevated LFTs. Fevers and rash resolving. Increased O2 requirements c/f PNA/volume overload/COPD exacerbation also improving with diuresis, antibiotics and prednisone. Acute on Chronic Hypoxic Respiratory Failure - Concern for PNA - Volume Overload:  Differential includes PNA vs COPD exacerbation vs. Volume overload, most likely multifactorial. Per sister presented with 1 day of dyspnea, stopped wearing his home O2, and no pulse ox at home. At baseline on 2L of supplemental oxygen at home. Found to be hypoxic at OSH requiring HFNC. CXR with evidence of pulmonary edema and trace left pleural effusion. Evening of transfer developed fever to 38.C raising concern for PNA. History of COPD having recently completed an extended steroid taper for exacerbation on prior admission and has poor airflow on exam. Also with 3+ pitting edema of the lower extremities suggesting component of volume overload and pro-BNP of 4240 compared to 88-100 on prior labs. Respiratory Pathogen Panel, SARS-CoV-2 PCR negative. CT Chest Noncon (10/26) w/ multifocal nodular and consolidative opacities, compatible with bronchopneumonia of possible viral etiology.  - Continue treatment for CAP (cefepime 10/26-10/29); Zosyn (10/29-), will hold atypical cvg given negative RPP  - Prednisone 40 mg for COPD exacerbation (10/26 - 30)  - Scheduled Duonebs  - Goal to diuresis -1 L daily vs 40 mg lasix PRN as BP allows  - Nighttime BiPAP, wean O2 as able  - Cont Zosyn for 7 day course from start of antibiotics at OSH    Fevers (resolved) - Most likely due to PNA, fevered to 39.4 on 10/26 then slowly defervesced throughout day. UA unremarkable, Blood cultures drawn and cultures from OSH NG x 24 hours after calling micro to confirm  -Cefepime (10/26 - 10/29)  -Start Zosyn (extended dosing) (10/29)  -CT chest as above  -F/u OSH BC from 10/24 (NG x 4 days) on 10/29 and in house BCx NGTD AMS/Left facial droop Patient developed acute-onset left-sided facial droop and LUE weakness on day of admission 10/27. Evaluated by Neurology NIHSS 8 initially, CTH negative. 24 hours of video EEG completed and without focal seziures, only showed difuse slowing. Neurology signed off. Droop largely resolved and strength equal on exam today. Mental status seems at baseline (hx of intellectual disability). EEG w/o e/o acute event, discontinued. Continued UE shaking.  - Stop Cefepime    Elevated LFTs: Characterized by mild hyperbilirubinemia and transaminase elevations.TBili first noted to be elevated on 10/20, AST/ALT/AlkPhos elevation since 10/21. Likely drug induced. Could be related to azacitidine which can cause transient elevation of liver enzymes. Also discharged on amicare which may have exacerbated transaminitis. Hep B/C and HIV were negative in 11/2017.  Most recent abdominal imaging (CT) showed hepatosplenomegaly but no evidence of cirrhosis. RUQ [place] without biliary duct dilation, showed mild hepatomegaly, s/p cholecystectomy. Liver function labs downtrending.  -Daily CMP  -Holding chemo and amicar, monitor for improvement    Ileus: Dilated loops of bowel on KUB concerning for partial SBO vs ileus after transient report of abdominal pain. Pt with history of going through cycles of constipation followed by diarrhea. Sister reports he did have one dark (chronic melena) BM on 10/25 prior to presentation to hospital. No BMs in house but passing flatus, has bowel sounds, and exam benign. Resolved with bowel regimen.  -Miralax TID  -Senna BID    Diffuse Rash - Facial edema (resolving)- suspect mild drug allergy to vanc vs. Amicar. Swelling and itching improved with topical steroid cream, prednisone for COPD exacerbation, and 1x benadryl. Rash still present with diffuse erythematous macules on trunk. Circumscribed lesions on back likely psoriatic given hx of psoriasis per chart review.  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 Vertell Limber. BMBx on 01/15/2019 was hypercellular (70%) with 30% blasts. He has complex cytogenetics and TP53 mutation. He was started on Azacitdine/Venetoclax, C1D1 = 10/6. Course complicated by pancytopenia and GI bleeding. He has a poor prognosis with and without chemo. Began to discuss pt's goals of care with pt and sister today- pt with poor understanding of risks vs. Benefits of chemo and only short term outlook I want to go home. Sister to discuss with her other sister  - Continue goals of care discussions re: risks vs. Benefits of continuing chemo given multiple recent hospitalizations and poor prognosis. Initially planned BMBx this afternoon for evaluation of status of his AML, however patient and sister both decline.  - Cont Venetoclax    Chronic Medical Problems:  HTN: Holding amlodipine 10 mg daily  CAD: Stents last placed on 02/2016 with reported EF of 50%. Most recent heart cath on 01/2018 was unremarkable. Holding IMDUR, PRN diureses with furosemide depending on volume status.  - Restart Metoprolol 12.5 mg BID    COPD: Continue duonebs  Chronic Pain: Continue home pain regimen      Daily Checklist:  Access: PIV  Diet:??Regular  Electrolytes:??Replete PRN  DVT PPx: Contraindicated due to thrombocytopenia  GI PPx: Not Indicated  Code Status: Full Code  Dispo:??Stepdown, Med E1  _______________________________________________________________    Objective:     Vital Signs:  Temp:  [36.6 ??C-37.3 ??C] 36.9 ??C  Heart Rate:  [60-195] 74  SpO2 Pulse:  [60-69] 68  Resp:  [14-28] 17  BP: (87-127)/(49-81) 127/81  MAP (mmHg):  [69-93] 93  FiO2 (%):  [40 %] 40 %  SpO2:  [84 %-100 %] 100 %    Physical Exam:  General: Chronically ill-appearing male, NAD HEENT: Head: normocephalic, atraumatic. Left eyelid swollen, eye with less drainage and able to open easier compared to day prior. Eyelid erythematous.  Neck: difficult to appreciate JVD due to body habitus. Rash R side of neck improving.  Chest/Lungs: Comfortable-appearing on HFNC, faint rhonchi bilaterally  Heart: regular rate rhythm in AM, normal S1, S2, no murmurs, rubs, clicks or gallops  Abdomen: soft, nontender, distended, no masses, normoactive BS  Extremities: bilateral LE edema 2+ pitting to knee, WWP  Skin: interval improvement in urticarial rash  Psychiatric: At baseline    Diagnostic Studies:  I reviewed all pertinent diagnostic studies.  ______________

## 2019-03-07 NOTE — Unmapped (Signed)
VENOUS ACCESS ULTRASOUND PROCEDURE NOTE    Indications:   Poor venous access.    The Venous Access Team has assessed this patient for the placement of a PIV. Ultrasound guidance was necessary to obtain access.     Procedure Details:  Risks, benefits and alternatives discussed with patient. Identity of the patient was confirmed via name, medical record number and date of birth. The availability of the correct equipment was verified.    The vein was identified for ultrasound catheter insertion.  Field was prepared with necessary supplies and equipment.  Probe cover and sterile gel utilized.  Insertion site was prepped with chlorhexidine solution and allowed to dry.  The catheter extension was primed with normal saline.A(n) 20 g x 1.75 inch catheter was placed in the right forearm with 1 attempt(s).     Catheter aspirated, 4 mL blood return present. The catheter was then flushed with 10 mL of normal saline. Insertion site cleansed, and dressing applied per manufacturer guidelines. The catheter was inserted without difficulty  by Michel Santee RN.      RN was notified.     Thank you,     Michel Santee RN Venous Access Team   [phone]     Workup / Procedure Time:  30 minutes    See vein image below:

## 2019-03-07 NOTE — Unmapped (Signed)
OCCUPATIONAL THERAPY  Evaluation(overlap with PT Raymond [name]) (03/07/19 1140)    Patient Name:  Raymond [name]       Medical Record Number: [id number]   Date of Birth: Mar 24, 1956  Sex: Male          OT Treatment Diagnosis:  decreased fx cognition, decreased activity tolerance, decreased static/dynamic [name] balance, decreased safety awareness, impacting ADL performance/safety    Assessment  Problem List: Impaired balance, Decreased endurance, Decreased range of motion, Decreased strength, Decreased cognition, Decreased safety awareness, Impaired ADLs, Fall Risk  Assessment: Raymond [name] is a 63 y.o. man with a history of COPD and recently diagnosed AML with course complicated by treatment related pancytopenia and GI bleed who presents as a transfer from [facility] for management of AHRF and elevated LFTs. Fevers and rash resolving. Increased O2 requirements c/f PNA/volume overload/COPD exacerbation also improving with diuresis, antibiotics and prednisone. At baseline, patient was mod-i in ADLs, assist IADLs, and mod-i functional mobility. Patient is now limited by problem list as mentioned above, impacting ADL/functional mobility performance and safety. Patient would benefit from skilled OT services 3-4 times per week while at [facility], recommend continued skilled OT services 5 times per week at a low intensity upon discharge. After review of the patient's occupational profile and history, assessment of occupational performance, clinical decision making, and development of POC, the patient presents as a moderate complexity case. Today's Interventions: OT evaluation, bed mobility, functional transfers, toileting, toilet transfer, self feeding, upper body dressing, grooming tasks, sitting tolerance and [name] tolerance. Patient educated on role of OT, POC, safety, importance of RN/therapy assist with OOB mobility, benefits of early ADL engagement, fall prevention techniques (reducing clutter, adequate lighting, use of AD, seated ADL tasks),    Activity Tolerance During Today's Session  Patient tolerated treatment well    Plan  Planned Frequency of Treatment:  1-2x per day for: 3-4x week       Planned Interventions:  Adaptive equipment, ADL retraining, Balance activities, Bed mobility, Compensatory tech. training, Conservation, Education - Patient, Education - Family / caregiver, Endurance activities, Functional cognition, Functional mobility, Home exercise program, Modalities, Postular / Proximal stability, Positioning, Range of motion, Safety education, Transfer training, UE Strength / coordination exercise    Post-Discharge Occupational Therapy Recommendations:  OT Post Acute Discharge Recommendations: 5x weekly, Low intensity   OT DME Recommendations: Defer to post acute    GOALS:   Patient and Family Goals: I want to go home!    Long Term Goal #1: Pt will score 24/24 on AMPAC raw score       Short Term:  Pt will complete toilet t/f and toileting tasks with LRAD and SBA   Time Frame : 2 weeks  Pt will complete LB dressing with LRAD and supervision   Time Frame : 2 weeks  Pt will complete 2+ [name] grooming ADLs with LRAD and supervision   Time Frame : 2 weeks                  Prognosis:  Good  Positive Indicators:  PLOF, family support  Barriers to Discharge: Impulsivity, Decreased safety awareness, Endurance deficits, Cognitive deficits, Gait instability, Impaired Balance, Pain    Subjective  Current Status Pt left/rec;d in side lying, RN in room, all needs met, call bell within reach, RN updated Prior Functional Status Pt may be unreliable historian. PTA, pt reports being mod-I with all ADL/fx mobility, assist for IADls, no AD use, no falls. Pt reports currently living with  his sister. Pt does not work, enjoys watching TV.            Patient / Caregiver reports: I feel weaker than usua    Past Medical History:   Diagnosis Date   ??? Angina pectoris (CMS-HCC)    ??? Anxiety    ??? Arthritis     bilateral knees   ??? CAD (coronary artery disease)    ??? Depression    ??? Dyslipidemia    ??? HL (hearing loss)    ??? Hyperlipidemia    ??? Hypertension    ??? Medically noncompliant    ??? Mental retardation    ??? MI (myocardial infarction) (CMS-HCC)    ??? Migraines    ??? Sleep apnea     USES CPAP OCCASIONALLY   ??? Thrombocytopenia (CMS-HCC) 01/20/2019    Social History     Tobacco Use   ??? Smoking status: Former Smoker     Packs/day: 1.00     Years: 5.00     Pack years: 5.00     Types: Cigarettes     Quit date: 04/11/2014     Years since quitting: 4.9   ??? Smokeless tobacco: Never Used   Substance Use Topics   ??? Alcohol use: No     Alcohol/week: 0.0 standard drinks      Past Surgical History:   Procedure Laterality Date   ??? APPENDECTOMY     ??? CARDIAC CATHETERIZATION     ??? CHOLECYSTECTOMY     ??? CORONARY STENT PLACEMENT     ??? HEMORRHOID SURGERY     ??? PR CATH PLACE/CORON ANGIO, IMG SUPER/INTERP,W LEFT HEART VENTRICULOGRAPHY N/A 02/25/2016    Procedure: Left Heart Catheterization W Intervention;  Surgeon: Job Founds, MD;  Location: [facility] CATH;  Service: Cardiology   ??? PR KNEE SCOPE,PART SYNOVECT Right 04/21/2015    Procedure: ARTHROSCOPY, KNEE, SURGICAL; SYNOVECTOMY LIMITED (EG, PLICA OR SHELF RESECTION) (SEPARATE PROCEDURE);  Surgeon: Annice Needy, MD;  [facility];  Service: Orthopedics   ??? PR KNEE SCOPE,SINGLE MENISECTOMY Right 04/21/2015    Procedure: ARTHROSCOPY, KNEE; W/MENISECTMY/CHONDROPLASTY/SYNOVECTOMY;  Surgeon: Annice Needy, MD;  [name];  Service: Orthopedics ??? PR XCAPSL CTRC RMVL INSJ IO LENS PROSTH W/O ECP Right 03/12/2017    Procedure: CATARACT RIGHT EYE;  Surgeon: Gwynneth Aliment, MD;  Location: [name];  Service: Ophthalmology   ??? PR XCAPSL CTRC RMVL INSJ IO LENS PROSTH W/O ECP Left 05/28/2017    Procedure: CATARACT LEFT EYE;  Surgeon: Gwynneth Aliment, MD;  [facility]: [name];  Service: Ophthalmology   ??? SKIN BIOPSY     ??? TONSILLECTOMY      Family History   Problem Relation Age of Onset   ??? Heart attack Father    ??? Melanoma Neg Hx    ??? Basal cell carcinoma Neg Hx    ??? Squamous cell carcinoma Neg Hx         Patient has no known allergies.     Objective Findings  Precautions / Restrictions  Falls precautions    Weight Bearing  Non-applicable    Required Braces or Orthoses  Non-applicable    Communication Preference  Verbal    Pain  Pt screaming in pain while attempting to toilet in [name], no rating provided, Rn aware and in room    Equipment / Environment  Supplemental oxygen, Vascular access (PIV, TLC, Port-a-cath, PICC), Foley, Patient not wearing mask for full session, Telemetry(4L [place], able to  ween to 2L [place])    Living Situation  Living Environment: Trailer  Lives With: Sibling(s)  Home Living: One level home, Stairs to enter with rails  Rail placement (outside): Rail on left side  Number of Stairs: 2     Cognition   Orientation Level:  Other(unable to orient)   Arousal/Alertness:  Delayed responses to stimuli   Attention Span:  Attends with cues to redirect, Difficulty attending to directions   Memory:  Unable to assess   Following Commands:  Not following commands, Follows one step commands with increased time, Follows one step commands with repetition   Safety Judgment:  Decreased awareness of need for assistance, Decreased awareness of need for safety Awareness of Errors:  Decreased awareness of need for assistance, Decreased awareness of need for safety, Assistance required to identify errors made, Assistance required to correct errors made, Decreased awareness of errors   Problem Solving:  Assistance required to identify errors made, Assistance required to implement solutions, Assistance required to generate solutions   Comments: Pt with intellectual disability at baseline, unable to formally assess this session, pt with delayed respones and requiring multiple repetitions for commands, at times not follow commands for safety (i.e. placing weight on tray table instead of therapist hands, refusing to sit down)    Vision / Perception    Hearing: appear WFL   Vision: Other  Perception: decreased 2/2 L eye  Comments: Pt with conjunctivits to L eye    Hand Function  Hand Dominance: unknown  WFL    Skin Inspection  conjunctivits to L eye, lesions throughout body    ROM / Strength/Coordination  UE ROM/ Strength/ Coordination: appear WFL unable to formally assess  LE ROM/ Strength/ Coordination: B LE weakness noted in [name], B knee buckling    Sensation:  denies parasthesias    Balance:  sitting: CGA-SBA; [name]: min. A HHA progression to mod. Ax2 with B knee block    Functional Mobility  Transfer Assistance Needed: Yes  Transfers - Needs Assistance: Mod assist, Verbal assist/cues required(sit <> stand: min. AHHA progression to mod. Ax2 HHA and B knee block)  Bed Mobility Assistance Needed: Yes  Bed Mobility - Needs Assistance: Verbal assist/cues required, Physical assistance required(sup <> sit: CGA)      ADLs  ADLs: Needs assistance with ADLs  ADLs - Needs Assistance: Grooming, Feeding, Bathing, Toileting, UB dressing, LB dressing  Feeding - Needs Assistance: Requires additional structure(set up A to self feed ice chips)  Grooming - Needs Assistance: Mod assist(Pt required mod. A to wipe L eye with wash cloth for thoroughness, pt able to bring to face) Bathing - Needs Assistance: Verbal assist/cues required, Mod assist(anticipate min-mod. A)  Toileting - Needs Assistance: Verbal assist/cues required, Max assist(Pt required mod. A HHAx2 in [name] for toileting; max. A for peri hygiene)  UB Dressing - Needs Assistance: Min assist(min. a to don/doff gown EOB)  LB Dressing - Needs Assistance: Mod assist(anticipate mod. A)  IADLs: NT      Vitals / Orthostatics  At Rest: VSS on 4L [place] 100, poor pleth; able to decreased to 2L  With Activity: VSS  Orthostatics: asymptomatic      Medical Staff Made Aware: RN awaer      Occupational Therapy Session Duration  OT Individual - Duration: 25         I attest that I have reviewed the above information.  Signed: Merril Abbe, OT  Filed 03/07/2019

## 2019-03-07 NOTE — Unmapped (Signed)
PHYSICAL THERAPY  Evaluation (03/07/19 1146)     Patient Name:  Raymond [name]       Medical Record Number: [id number]   Date of Birth: November 07, 1955  Sex: Male            Treatment Diagnosis: deconditioning, decreased endurance, impaired balance, further limited by poor safety awareness, cognitive status, and impulsivity    ASSESSMENT  Problem List: Decreased mobility, Impaired balance, Decreased endurance, Fall Risk, Decreased safety awareness, Poor awareness of body mechanics, Impaired judgement, Gait deviation, Pain, Decreased cognition, Impaired ADLs     Assessment : Raymond [name] is a 63 y.o. man with a history of COPD and recently diagnosed AML with course complicated by treatment related pancytopenia and GI bleed who presents as a transfer from [facility] for management of AHRF and elevated LFTs. Fevers and rash resolving. Increased O2 requirements c/f PNA/volume overload/COPD exacerbation also improving with diuresis, antibiotics and prednisone. Pt presents to PT with deficits in functional mobility (see problem list). Pt is limited by impulsivity, poor safety awareness, and impaired cognition.  Pt will benefit from skilled acute PT to address mobility deficits. Recommend post-acute PT 5x/wk(low) to maximize functional indep and safety prior to return home, as pt is currently at an elevated risk for falls. After a review of the personal factors, comorbidities, clinical presentation, and examination of the number of affected body systems, the patient presents as a moderate complexity case.      Today's Interventions: Eval, ther act, transfers, standing balance/tolerance, pt ed: role of PT, POC, safety awareness, all questions answered.                          PLAN  Planned Frequency of Treatment:  1-2x per day for: 3-4x week Planned Interventions: Balance activities, Endurance activities, Self-care / Home training, Education - Patient, Education - Family / caregiver, Teacher, early years/pre, Therapeutic activity, Home exercise program, Gait training, Therapeutic exercise, Functional mobility, Stair training    Post-Discharge Physical Therapy Recommendations:  5x weekly, Low intensity    PT DME Recommendations: Defer to post acute           Goals:   Patient and Family Goals: to go home    Long Term Goal #1: In 6 weeks, pt will score 24/24 on AMPAC       SHORT GOAL #1: Pt will perform all transfers mod indep with LRAD.              Time Frame : 1 week  SHORT GOAL #2: Pt will ambulate 100 ft mod indep with LRAD.              Time Frame : 2 weeks  SHORT GOAL #3: Pt will negotiate 2 steps with single railing and supervision.              Time Frame : 2 weeks                                        Prognosis:        Barriers to Discharge: Impulsivity, Decreased safety awareness, Endurance deficits, Cognitive deficits, Gait instability, Impaired Balance, Pain    SUBJECTIVE  Patient reports: Pt/RN agreeable to PT  Current Functional Status: Pt presents side-lying in bed. Session concluded with pt side-lying in bed, RN present, all immediate needs met.     Prior  Functional Status: PTA, pt reports he was indep with mobility and ADL without device.  Equipment available at home: None     Past Medical History:   Diagnosis Date   ??? Angina pectoris (CMS-HCC)    ??? Anxiety    ??? Arthritis     bilateral knees   ??? CAD (coronary artery disease)    ??? Depression    ??? Dyslipidemia    ??? HL (hearing loss)    ??? Hyperlipidemia    ??? Hypertension    ??? Medically noncompliant    ??? Mental retardation    ??? MI (myocardial infarction) (CMS-HCC)    ??? Migraines    ??? Sleep apnea     USES CPAP OCCASIONALLY   ??? Thrombocytopenia (CMS-HCC) 01/20/2019    Social History     Tobacco Use   ??? Smoking status: Former Smoker     Packs/day: 1.00     Years: 5.00     Pack years: 5.00 Types: Cigarettes     Quit date: 04/11/2014     Years since quitting: 4.9   ??? Smokeless tobacco: Never Used   Substance Use Topics   ??? Alcohol use: No     Alcohol/week: 0.0 standard drinks      Past Surgical History:   Procedure Laterality Date   ??? APPENDECTOMY     ??? CARDIAC CATHETERIZATION     ??? CHOLECYSTECTOMY     ??? CORONARY STENT PLACEMENT     ??? HEMORRHOID SURGERY     ??? PR CATH PLACE/CORON ANGIO, IMG SUPER/INTERP,W LEFT HEART VENTRICULOGRAPHY N/A 02/25/2016    Procedure: Left Heart Catheterization W Intervention;  Surgeon: Job Founds, MD;  Location: [facility] CATH;  Service: Cardiology   ??? PR KNEE SCOPE,PART SYNOVECT Right 04/21/2015    Procedure: ARTHROSCOPY, KNEE, SURGICAL; SYNOVECTOMY LIMITED (EG, PLICA OR SHELF RESECTION) (SEPARATE PROCEDURE);  Surgeon: Annice Needy, MD;  [facility];  Service: Orthopedics   ??? PR KNEE SCOPE,SINGLE MENISECTOMY Right 04/21/2015    Procedure: ARTHROSCOPY, KNEE; W/MENISECTMY/CHONDROPLASTY/SYNOVECTOMY;  Surgeon: Annice Needy, MD;  Location: [name] [facility];  Service: Orthopedics   ??? PR XCAPSL CTRC RMVL INSJ IO LENS PROSTH W/O ECP Right 03/12/2017    Procedure: CATARACT RIGHT EYE;  Surgeon: Gwynneth Aliment, MD;  Location: [name] [facility];  Service: Ophthalmology   ??? PR XCAPSL CTRC RMVL INSJ IO LENS PROSTH W/O ECP Left 05/28/2017    Procedure: CATARACT LEFT EYE;  Surgeon: Gwynneth Aliment, MD;  Location: [name];  Service: Ophthalmology   ??? SKIN BIOPSY     ??? TONSILLECTOMY      Family History   Problem Relation Age of Onset   ??? Heart attack Father    ??? Melanoma Neg Hx    ??? Basal cell carcinoma Neg Hx    ??? Squamous cell carcinoma Neg Hx         Allergies: Patient has no known allergies.                Objective Findings  Precautions / Restrictions  Precautions: Falls precautions  Weight Bearing Status: Non-applicable  Required Braces or Orthoses: Non-applicable    Communication Preference: Verbal Pain Comments: pt grunting in pain, stating it hurts to pee!, RN aware  Medical Tests / Procedures: vitals, labs, orders reviewed in Epic  Equipment / Environment: Vascular access (PIV, TLC, Port-a-cath, PICC), Telemetry, Supplemental oxygen, Patient not wearing mask for full session    At Rest: HR 72, SpO2 100%  on 4L/min [place]  With Activity: VSS on 3L/min [place]  Orthostatics: asymptomatic  Airway Clearance: mobility to promote airway clearance    Living Situation  Living Environment: Physiological scientist  Lives With: Sibling(s)  Home Living: One level home, Stairs to enter with rails  Rail placement (outside): Rail on left side  Number of Stairs: 2     Cognition: answers simple questions appropriately, follows simple commands appropriately >50% of trials     Skin Inspection: visibly intact    UE ROM: WFL  UE Strength: WFL  LE ROM: WFL  LE Strength: full antigravity ROM                          Balance: static sitting with supervision; static standing with minA HHA initially but progressing to modA x2 HHA and B knees blocked once fatigued. Pt reaching for tray table (on wheels) while standing, max cueing for safety awareness         Bed Mobility: supine to sit with CGA  Transfers: sit<>stand with minA HHA   Gait  Level of Assistance: Dependent, patient does less than 25%  Distance Ambulated (ft): 0 ft  Gait: pt unable to progress to ambulation 2/2 grunting/screaming out in pain while standing (it hurts to pee!), demonstrating knee buckling after prolonged standing (pt declining to return to sitting despite strong encouragement)         Endurance: decreased from baseline; currently requiring 3-4L/min [place], baseline is 2L/min [place]    Physical Therapy Session Duration  PT Individual - Duration: 20    Medical Staff Made Aware: RN    I attest that I have reviewed the above information.  Signed: Aldean Ast, PT  Filed 03/07/2019

## 2019-03-07 NOTE — Unmapped (Signed)
Intact to confused. Able to answer all orientation questions correctly but seems a little off and displays poor judgement/safety awareness. Upon arrival to [facility], blood gas called with a critical of Co2 of 83. MD paged and BiPAP ordered. NSR with HR ranging high 50s-80s. BP stable. Midnight metop held according to parameters. MD aware. There seemed to be some confusion with pharmacy regarding PO chemo that was due to prior to arrival to [facility]. MD aware that pt missed his dose with the plan to receive his dose today at 1800. Pt has been on BiPAP for majority of the night. Will plan to recheck another VBG this am. Pt has multiple BM's overnight but appeared to be straining very hard. Stool softener given per order. Pt removed his PIV this morning. VAT team placed new PIV with ultrasound. Adequate UOP. Free from falls/injury. ROUNDs/turns q 2 hours. Wctm.         Problem: Adult Inpatient Plan of Care  Goal: Plan of Care Review  Outcome: Ongoing - Unchanged  Goal: Patient-Specific Goal (Individualization)  Outcome: Ongoing - Unchanged  Goal: Absence of Hospital-Acquired Illness or Injury  Outcome: Ongoing - Unchanged  Goal: Optimal Comfort and Wellbeing  Outcome: Ongoing - Unchanged  Goal: Readiness for Transition of Care  Outcome: Ongoing - Unchanged  Goal: Rounds/Family Conference  Outcome: Ongoing - Unchanged     Problem: Wound  Goal: Optimal Wound Healing  Outcome: Ongoing - Unchanged     Problem: Fall Injury Risk  Goal: Absence of Fall and Fall-Related Injury  Outcome: Ongoing - Unchanged     Problem: Skin Injury Risk Increased  Goal: Skin Health and Integrity  Outcome: Ongoing - Unchanged     Problem: Respiratory Compromise (Pneumonia)  Goal: Effective Oxygenation and Ventilation  Outcome: Ongoing - Unchanged     Problem: Hypertension Comorbidity  Goal: Blood Pressure in Desired Range  Outcome: Ongoing - Unchanged     Problem: Self-Care Deficit  Goal: Improved Ability to Complete Activities of Daily Living Outcome: Ongoing - Unchanged     Problem: Infection  Goal: Infection Symptom Resolution  Outcome: Ongoing - Unchanged

## 2019-03-08 DIAGNOSIS — R0902 Hypoxemia: Secondary | ICD-10-CM | POA: Diagnosis not present

## 2019-03-08 DIAGNOSIS — R251 Tremor, unspecified: Secondary | ICD-10-CM | POA: Diagnosis not present

## 2019-03-08 DIAGNOSIS — D61818 Other pancytopenia: Secondary | ICD-10-CM | POA: Diagnosis not present

## 2019-03-08 DIAGNOSIS — C9200 Acute myeloblastic leukemia, not having achieved remission: Secondary | ICD-10-CM | POA: Diagnosis not present

## 2019-03-08 DIAGNOSIS — J90 Pleural effusion, not elsewhere classified: Secondary | ICD-10-CM | POA: Diagnosis not present

## 2019-03-08 DIAGNOSIS — D709 Neutropenia, unspecified: Secondary | ICD-10-CM | POA: Diagnosis not present

## 2019-03-08 DIAGNOSIS — J9621 Acute and chronic respiratory failure with hypoxia: Secondary | ICD-10-CM | POA: Diagnosis not present

## 2019-03-08 DIAGNOSIS — R5081 Fever presenting with conditions classified elsewhere: Secondary | ICD-10-CM | POA: Diagnosis not present

## 2019-03-08 LAB — BLOOD GAS CRITICAL CARE PANEL, ARTERIAL
BASE EXCESS ARTERIAL: 21 — ABNORMAL HIGH (ref -2.0–2.0)
CALCIUM IONIZED ARTERIAL (MG/DL): 4.13 mg/dL — ABNORMAL LOW (ref 4.40–5.40)
GLUCOSE WHOLE BLOOD: 125 mg/dL (ref 70–179)
HCO3 ARTERIAL: 47 mmol/L — ABNORMAL HIGH (ref 22–27)
HEMOGLOBIN BLOOD GAS: 6.8 g/dL — ABNORMAL LOW (ref 13.50–17.50)
LACTATE BLOOD ARTERIAL: 1.9 mmol/L — ABNORMAL HIGH (ref <1.3)
O2 SATURATION ARTERIAL: 95.8 % (ref 94.0–100.0)
PCO2 ARTERIAL: 62.4 mmHg — ABNORMAL HIGH (ref 35.0–45.0)
PH ARTERIAL: 7.49 — ABNORMAL HIGH (ref 7.35–7.45)
PO2 ARTERIAL: 70.1 mmHg — ABNORMAL LOW (ref 80.0–110.0)
SODIUM WHOLE BLOOD: 130 mmol/L — ABNORMAL LOW (ref 135–145)

## 2019-03-08 LAB — COMPREHENSIVE METABOLIC PANEL
ALBUMIN: 2.3 g/dL — ABNORMAL LOW (ref 3.5–5.0)
ALKALINE PHOSPHATASE: 121 U/L (ref 38–126)
ALT (SGPT): 68 U/L — ABNORMAL HIGH (ref <50)
AST (SGOT): 77 U/L — ABNORMAL HIGH (ref 19–55)
BILIRUBIN TOTAL: 1.1 mg/dL (ref 0.0–1.2)
BLOOD UREA NITROGEN: 29 mg/dL — ABNORMAL HIGH (ref 7–21)
BUN / CREAT RATIO: 51
CALCIUM: 7.2 mg/dL — ABNORMAL LOW (ref 8.5–10.2)
CHLORIDE: 80 mmol/L — ABNORMAL LOW (ref 98–107)
CREATININE: 0.57 mg/dL — ABNORMAL LOW (ref 0.70–1.30)
EGFR CKD-EPI AA MALE: >90 mL/min/{1.73_m2} (ref >=60)
EGFR CKD-EPI NON-AA MALE: >90 mL/min/{1.73_m2} (ref >=60)
GLUCOSE RANDOM: 116 mg/dL (ref 70–179)
PROTEIN TOTAL: 5.2 g/dL — ABNORMAL LOW (ref 6.5–8.3)
SODIUM: 128 mmol/L — ABNORMAL LOW (ref 135–145)

## 2019-03-08 LAB — URINALYSIS
BACTERIA: NONE SEEN /HPF
BILIRUBIN UA: NEGATIVE
GLUCOSE UA: NEGATIVE
KETONES UA: NEGATIVE
LEUKOCYTE ESTERASE UA: NEGATIVE
NITRITE UA: NEGATIVE
PH UA: 8 (ref 5.0–9.0)
PROTEIN UA: 100 — AB
RBC UA: 21 /HPF — ABNORMAL HIGH (ref <=3)
SPECIFIC GRAVITY UA: 1.013 (ref 1.003–1.030)
SQUAMOUS EPITHELIAL: <1 /HPF (ref 0–5)
UROBILINOGEN UA: 4 — AB

## 2019-03-08 LAB — CBC W/ AUTO DIFF
BASOPHILS ABSOLUTE COUNT: 0 10*9/L (ref 0.0–0.1)
BASOPHILS RELATIVE PERCENT: 6.9 %
EOSINOPHILS ABSOLUTE COUNT: 0 10*9/L (ref 0.0–0.4)
HEMOGLOBIN: 7.2 g/dL — ABNORMAL LOW (ref 13.5–17.5)
LARGE UNSTAINED CELLS: 20 % — ABNORMAL HIGH (ref 0–4)
LYMPHOCYTES ABSOLUTE COUNT: 0.1 10*9/L — ABNORMAL LOW (ref 1.5–5.0)
MEAN CORPUSCULAR HEMOGLOBIN CONC: 32.4 g/dL (ref 31.0–37.0)
MEAN CORPUSCULAR HEMOGLOBIN: 30.9 pg (ref 26.0–34.0)
MEAN PLATELET VOLUME: 10.9 fL — ABNORMAL HIGH (ref 7.0–10.0)
MONOCYTES ABSOLUTE COUNT: 0 10*9/L — ABNORMAL LOW (ref 0.2–0.8)
MONOCYTES RELATIVE PERCENT: 13.7 %
NEUTROPHILS ABSOLUTE COUNT: 0 10*9/L — CL (ref 2.0–7.5)
NEUTROPHILS RELATIVE PERCENT: 13.7 %
NUCLEATED RED BLOOD CELLS: 24 /100{WBCs} — ABNORMAL HIGH (ref <=4)
PLATELET COUNT: 60 10*9/L — ABNORMAL LOW (ref 150–440)
RED BLOOD CELL COUNT: 2.34 10*12/L — ABNORMAL LOW (ref 4.50–5.90)
RED CELL DISTRIBUTION WIDTH: 17.1 % — ABNORMAL HIGH (ref 12.0–15.0)
WBC ADJUSTED: 0.2 10*9/L — CL (ref 4.5–11.0)

## 2019-03-08 LAB — TOXIC GRANULATION

## 2019-03-08 LAB — MAGNESIUM: Magnesium:MCnc:Pt:Ser/Plas:Qn:: 1.8

## 2019-03-08 LAB — CALCIUM: Calcium:MCnc:Pt:Ser/Plas:Qn:: 7.2 — ABNORMAL LOW

## 2019-03-08 LAB — TROPONIN I: Troponin I.cardiac:MCnc:Pt:Ser/Plas:Qn:: <0.034

## 2019-03-08 LAB — PO2 ARTERIAL: Oxygen:PPres:Pt:BldA:Qn:: 70.1 — ABNORMAL LOW

## 2019-03-08 LAB — PH UA: pH:LsCnc:Pt:Urine:Qn:Test strip: 8

## 2019-03-08 LAB — SLIDE REVIEW

## 2019-03-08 LAB — PHOSPHORUS: Phosphate:MCnc:Pt:Ser/Plas:Qn:: 1.7 — ABNORMAL LOW

## 2019-03-08 LAB — PRO-BNP: Natriuretic peptide.B prohormone N-Terminal:MCnc:Pt:Ser/Plas:Qn:: 3390 — ABNORMAL HIGH

## 2019-03-08 LAB — HEMATOCRIT: Hematocrit:VFr:Pt:Bld:Qn:: 22.4 — ABNORMAL LOW

## 2019-03-08 NOTE — Unmapped (Addendum)
RT contacted via vocera regarding pt desat and request for placement on BiPAP. Due to pt being on floor, plans already in place to transfer to higher level of care as soon as available. RT placed pt on BiPAP 14/6. FiO2 increased from 30 to 40% for desat to 88 on BiPAP with 30%. PT tolerating well. Mepilex lite placed on bridge of nose. No breakdown noted.     Pt transported to [facility] (with stop in CT for scan of chest) on 100% NRB via rapid response RN.  BiPAP moved to [facility]U by RT. PT placed back on BiPAP at 1410 by RT on arrival. Pt tolerated transport well. No distress or desat at this time.

## 2019-03-08 NOTE — Unmapped (Addendum)
Pt transferred from [facility] at shift change yesterday. Pt alert & oriented x3 but forgetful. Moderate to significant hand tremors pt stated he did have prior to hospital admission. MD notified who assessed pt at bedside. RR called during the night for o2 level dropping to low 80's/high 70's while on BiPAP. See RR documentation.                 Pt now on cont pulse ox and 3L [place]. BIPAP was making pt agitated.  Will cont to closely monitor.

## 2019-03-08 NOTE — Unmapped (Signed)
Med E1 Inpatient Service  Daily Progress Note     Interval/Overnight/Subjective: Pt transferred from [facility] to [facility] after 10/30 given afib w/ RVR resolved (has been in NSR since evening 10/29). Oxygen weaned to home O2 2 L w/ bipap on for night time use and naps. Overnight RR was called for O2 sats in 70s-80s on bipap- however unclear if it was true hypoxia as pulse ox was on finger and pt has been having significant hand tremors.  His Lasix was redosed and he was placed on a fluid restriction of 1.5 L given that he has been drinking excessive amounts of water making it difficult to diurese appropriately.  However this morning he was hypoxic to mid 80s on 5 to 6 L of nasal cannula so was placed on Ventimask.  Requested RT to place patient back on his nighttime BiPAP 14/6 with FiO2 of 40% to maintain goal sat 88%.  Throughout patient denied any respiratory symptoms.  Redosed Lasix, ordered CT chest to compare to prior (which showed multifocal pneumonia) given increasing oxygen requirement and one-time fever overnight to 38.3.  Patient to transfer back to [facility] for closer monitoring.    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 man with a history of COPD and recently diagnosed AML with course complicated by treatment related pancytopenia and GI bleed who presents as a transfer from [facility] for management of AHRF and elevated LFTs. Fevers and rash resolving. Increased O2 requirements c/f PNA/volume overload/COPD exacerbation also improving with diuresis, antibiotics and prednisone. Acute on Chronic Hypoxic Respiratory Failure - PNA - Volume Overload:  AHRF likely multifactorial from multifocal pneumonia, COPD, and volume overload.  Per sister presented with 1 day of dyspnea, stopped wearing his home O2, and no pulse ox at home. At baseline on 2L of supplemental oxygen at home. Found to be hypoxic at OSH requiring HFNC. CXR with evidence of pulmonary edema and trace left pleural effusion. Evening of transfer developed fever to 38.C raising concern for PNA. History of COPD having recently completed an extended steroid taper for exacerbation on prior admission and has poor airflow on exam. Also with 3+ pitting edema of the lower extremities suggesting component of volume overload and pro-BNP of 4240 compared to 88-100 on prior labs. Respiratory Pathogen Panel, SARS-CoV-2 PCR negative. CT Chest Noncon (10/26) w/ multifocal nodular and consolidative opacities, compatible with bronchopneumonia of possible viral etiology. Had briefly returned to home O2 requirement of 2 L on 10/30, but now having increasing oxygen requirement. Has been diuresed successfully most days but intermittently with excess of p.o. fluid intake making it difficult to maintain goal of net negativity 1 L.  Also with new one-time fever overnight early a.m. 10/31.   - Continue treatment for CAP (cefepime 10/26-10/29); Zosyn (10/29-), will hold atypical cvg given negative RPP  - Prednisone 40 mg for COPD exacerbation (10/26 - 30)  - Hold Duonebs to see if contributing to upper extremity tremor  - Close monitoring for recurrence of fever  - Repeat CT chest w/o contrast to compare to prior and continue to eval for possible worsening of bacterial PNA vs. underlying fungal PNA given likely has been functionally neutropenic for a long period of time  -  Repeat pro-BNP   -- Nighttime BiPAP and prn daily for naps and O2 support when needed, wean as tolerated  - Cont Zosyn for 7 day course from start of antibiotics at OSH Fevers - Most likely due to PNA, fevered to 39.4 on 10/26 then slowly defervesced throughout day and had largely been afebrile until evening 10/30 with 1 temp of 38.3 UA unremarkable, Blood cultures drawn and cultures from OSH NG x 24 hours after calling micro to confirm  -Cefepime (10/26 - 10/29)  -Continue Zosyn (extended dosing) (10/29)  -Repeat CT chest as above  -F/u OSH BC from 10/24 (NG x 4 days) on 10/29 and in house BCx NGTD 10/29  -F/u new cultures 10/31      Afib w/ RVR - Pt with afib on 10/29 with rates as high as 170s-180s. No previous documentation of afib, was on metoprolol at home but may have been for CAD rather than for afib. Received 2 doses metoprolol 5 mg and 1 dose IV lasix 40 and converted to NSR within about an hour. Suspect afib was instigated by volume overload  -Metoprolol tartrate 12.5 mg BID  -Diuresis as above     Upper extremity tremor - S/p stroke w/u for L facial droop Patient developed acute-onset left-sided facial droop and LUE weakness on day of admission 10/27. Evaluated by Neurology NIHSS 8 initially, CTH negative. 24 hours of video EEG completed and without focal seziures, only showed difuse slowing. Neurology signed off. Droop largely resolved.. Mental status seems at baseline (hx of intellectual disability). However has had pronounced upper extremity tremors new on this admission   - Continue zosyn (cefepime DCed to r/o neurotoxicity)  - HOLD duonebs in case tremor is side effect of albuterol  - Reduce centrally acting meds: hold celexa Elevated LFTs: Characterized by mild hyperbilirubinemia and transaminase elevations.TBili first noted to be elevated on 10/20, AST/ALT/AlkPhos elevation since 10/21. Likely drug induced. Could be related to azacitidine which can cause transient elevation of liver enzymes. Also discharged on amicare which may have exacerbated transaminitis. Hep B/C and HIV were negative in 11/2017. Most recent abdominal imaging (CT) showed hepatosplenomegaly but no evidence of cirrhosis. RUQ [place] without biliary duct dilation, showed mild hepatomegaly, s/p cholecystectomy. Liver function labs downtrending.  -Daily CMP  -Holding amicar, CTM     Ileus: Dilated loops of bowel on KUB concerning for partial SBO vs ileus after transient report of abdominal pain. Pt with history of going through cycles of constipation followed by diarrhea. Sister reports he did have one dark (chronic melena) BM on 10/25 prior to presentation to hospital. Often in house has frequent BMs but with straining so will continue aggressive BM.   -Miralax TID  -Senna BID    Diffuse Rash - Facial edema (resolving)- suspect mild drug allergy to vanc vs. Amicar. Swelling and itching improved with topical steroid cream, prednisone for COPD exacerbation, and 1x benadryl. Rash still present with diffuse erythematous macules on trunk but improving. Circumscribed lesions on back likely psoriatic given hx of psoriasis per chart review.  -Avoid vanc for now  -Supportive care with topical cream  -WOCN consulting    L eye swelling/drainage (Stable)- Purulent drainage on admission  -Polymixin eye drops for bacterial conjuncitvitis Adverse Risk AML: Follows with Dr. Vertell Limber. BMBx on 01/15/2019 was hypercellular (70%) with 30% blasts. He has complex cytogenetics and TP53 mutation. He was started on Azacitdine/Venetoclax, C1D1 = 10/6. Course complicated by pancytopenia and GI bleeding. He has a poor prognosis with and without  chemo. Began to discuss pt's goals of care with pt and sister- pt with poor understanding of risks vs. Benefits of chemo and only short term outlook I want to go home. Unfortunately it has been difficult to get in touch with pt's HPOA (he designates his sister [name]) to discuss long term plans and she is unable to come to bedside this weekend. Initially planned for BM biopsy on 10/30 but she denied consent when heme fellow called her. She seems to be uncertain as to whether or not it is worth continuing venetoclax vs. Having pt go home with hospice. We will continue to try discussions with her depending on patient stability about pursuing repeat BM biopsy to guide whether or not to continue treatment vs just stopping all aggressive therapy now.   - Continue goals of care discussions re: risks vs. benefits of continuing chemo given multiple recent hospitalizations and poor prognosis as well as often tenuous respiratory status  - Cont Venetoclax (held on admission, restarted on 10/30)    Chronic Medical Problems:  HTN: Holding amlodipine 10 mg daily  CAD: Stents last placed on 02/2016 with reported EF of 50%. Most recent heart cath on 01/2018 was unremarkable. Holding IMDUR, diuresing as above, metoprolol tartrate 12.5 mg BID in place of home metoprolol succinate 25 mg daily  Chronic Pain: Continue home pain regimen      Daily Checklist:  Access: PIV  Diet:??Regular  Electrolytes:??Replete PRN  DVT PPx: Contraindicated due to thrombocytopenia  GI PPx: Not Indicated  Code Status: Full Code (for now, pt without capacity to change code status - need to discuss with health care proxy [name])  Dispo:??Stepdown, Med E1 Alford Highland, MD  Internal Medicine PGY2    _______________________________________________________________    Objective:     Vital Signs:  Temp:  [36.8 ??C-38.3 ??C] 36.8 ??C  Heart Rate:  [69-80] 73  SpO2 Pulse:  [68-70] 70  Resp:  [17-34] 25  BP: (107-138)/(38-81) 114/54  MAP (mmHg):  [61-93] 70  FiO2 (%):  [30 %] 30 %  SpO2:  [79 %-100 %] 98 %    Physical Exam: (as of AM 10/31)  General: Chronically ill-appearing male, NAD  HEENT: Head: normocephalic, atraumatic. Left eyelid swollen, eye with less drainage and able to open easier compared to admission. Eyelid erythematous.  Chest/Lungs: Mildly tachypneic on 3 L [place], lungs with rhonchi most prominent at the bases, no wheezing   Heart: regular rate rhythm, normal S1, S2, no murmurs, rubs, clicks or gallops  Abdomen: soft, nontender, nondistended, no masses  Extremities: bilateral LE edema 2+ pitting to knee, WWP  Skin: interval improvement in urticarial rash  Psychiatric: At baseline, repeating when can I go home and saying yes to most questions    Diagnostic Studies:  I reviewed all pertinent diagnostic studies.  ______________

## 2019-03-08 NOTE — Unmapped (Signed)
Patient continues to use NPPV with good compliance.

## 2019-03-08 NOTE — Unmapped (Signed)
Oriented x3, disoriented to situation. Cognitively delayed, delayed responses. Oxygen >95% on 2-3LNC. Unable to consent pt for bone marrow biopsy. Multiple family members called for updates, requested team to update the family. Increased output from indwelling foley, BM x4 this shift, pt w/ increased straining, bowel regimen in place. No additional adverse events this shift. Will continue to monitor. Pt transferred to [facility], report given to [place]. Pt transferred w/ admission clothing AND home chemo meds verifed by pharmacy.  Problem: Adult Inpatient Plan of Care  Goal: Plan of Care Review  Outcome: Progressing  Goal: Patient-Specific Goal (Individualization)  Outcome: Progressing  Goal: Absence of Hospital-Acquired Illness or Injury  Outcome: Progressing  Goal: Optimal Comfort and Wellbeing  Outcome: Progressing  Goal: Readiness for Transition of Care  Outcome: Progressing  Goal: Rounds/Family Conference  Outcome: Progressing     Problem: Wound  Goal: Optimal Wound Healing  Outcome: Progressing     Problem: Fall Injury Risk  Goal: Absence of Fall and Fall-Related Injury  Outcome: Progressing     Problem: Skin Injury Risk Increased  Goal: Skin Health and Integrity  Outcome: Progressing     Problem: Respiratory Compromise (Pneumonia)  Goal: Effective Oxygenation and Ventilation  Outcome: Progressing     Problem: Hypertension Comorbidity  Goal: Blood Pressure in Desired Range  Outcome: Progressing     Problem: Self-Care Deficit  Goal: Improved Ability to Complete Activities of Daily Living  Outcome: Progressing     Problem: Infection  Goal: Infection Symptom Resolution  Outcome: Progressing

## 2019-03-09 DIAGNOSIS — C9200 Acute myeloblastic leukemia, not having achieved remission: Secondary | ICD-10-CM | POA: Diagnosis not present

## 2019-03-09 DIAGNOSIS — J9621 Acute and chronic respiratory failure with hypoxia: Secondary | ICD-10-CM | POA: Diagnosis not present

## 2019-03-09 DIAGNOSIS — G8929 Other chronic pain: Secondary | ICD-10-CM | POA: Diagnosis not present

## 2019-03-09 DIAGNOSIS — M25562 Pain in left knee: Secondary | ICD-10-CM | POA: Diagnosis not present

## 2019-03-09 LAB — CBC W/ AUTO DIFF
BASOPHILS ABSOLUTE COUNT: 0 10*9/L (ref 0.0–0.1)
BASOPHILS RELATIVE PERCENT: 0 %
EOSINOPHILS ABSOLUTE COUNT: 0 10*9/L (ref 0.0–0.4)
EOSINOPHILS RELATIVE PERCENT: 2.1 %
HEMATOCRIT: 24.1 % — ABNORMAL LOW (ref 41.0–53.0)
HEMOGLOBIN: 7.7 g/dL — ABNORMAL LOW (ref 13.5–17.5)
LYMPHOCYTES ABSOLUTE COUNT: 0.2 10*9/L — ABNORMAL LOW (ref 1.5–5.0)
LYMPHOCYTES RELATIVE PERCENT: 60.5 %
MEAN CORPUSCULAR HEMOGLOBIN: 30.5 pg (ref 26.0–34.0)
MEAN CORPUSCULAR VOLUME: 94.9 fL (ref 80.0–100.0)
MEAN PLATELET VOLUME: 10.4 fL — ABNORMAL HIGH (ref 7.0–10.0)
MONOCYTES RELATIVE PERCENT: 5.7 %
NEUTROPHILS ABSOLUTE COUNT: 0.1 10*9/L — CL (ref 2.0–7.5)
NEUTROPHILS RELATIVE PERCENT: 16.3 %
NUCLEATED RED BLOOD CELLS: 18 /100{WBCs} — ABNORMAL HIGH (ref <=4)
PLATELET COUNT: 99 10*9/L — ABNORMAL LOW (ref 150–440)
RED BLOOD CELL COUNT: 2.54 10*12/L — ABNORMAL LOW (ref 4.50–5.90)
RED CELL DISTRIBUTION WIDTH: 17.8 % — ABNORMAL HIGH (ref 12.0–15.0)

## 2019-03-09 LAB — COMPREHENSIVE METABOLIC PANEL
ALBUMIN: 2.3 g/dL — ABNORMAL LOW (ref 3.5–5.0)
ALKALINE PHOSPHATASE: 118 U/L (ref 38–126)
ALT (SGPT): 67 U/L — ABNORMAL HIGH (ref <50)
AST (SGOT): 64 U/L — ABNORMAL HIGH (ref 19–55)
BLOOD UREA NITROGEN: 19 mg/dL (ref 7–21)
BUN / CREAT RATIO: 27
CALCIUM: 7.2 mg/dL — ABNORMAL LOW (ref 8.5–10.2)
CHLORIDE: 78 mmol/L — ABNORMAL LOW (ref 98–107)
CO2: >40 mmol/L (ref 22.0–30.0)
CREATININE: 0.7 mg/dL (ref 0.70–1.30)
EGFR CKD-EPI AA MALE: >90 mL/min/{1.73_m2} (ref >=60)
EGFR CKD-EPI NON-AA MALE: >90 mL/min/{1.73_m2} (ref >=60)
GLUCOSE RANDOM: 65 mg/dL — ABNORMAL LOW (ref 70–179)
POTASSIUM: 2.9 mmol/L — ABNORMAL LOW (ref 3.5–5.0)
SODIUM: 128 mmol/L — ABNORMAL LOW (ref 135–145)

## 2019-03-09 LAB — MAGNESIUM: Magnesium:MCnc:Pt:Ser/Plas:Qn:: 2

## 2019-03-09 LAB — FUNGITELL ASSAY: FUNGITELL QUALITATIVE: NEGATIVE

## 2019-03-09 LAB — BASIC METABOLIC PANEL
BLOOD UREA NITROGEN: 19 mg/dL (ref 7–21)
BUN / CREAT RATIO: 30
CALCIUM: 7.3 mg/dL — ABNORMAL LOW (ref 8.5–10.2)
CO2: >40 mmol/L (ref 22.0–30.0)
CREATININE: 0.63 mg/dL — ABNORMAL LOW (ref 0.70–1.30)
EGFR CKD-EPI NON-AA MALE: >90 mL/min/{1.73_m2} (ref >=60)
GLUCOSE RANDOM: 127 mg/dL (ref 70–179)
POTASSIUM: 3 mmol/L — ABNORMAL LOW (ref 3.5–5.0)
SODIUM: 123 mmol/L — ABNORMAL LOW (ref 135–145)

## 2019-03-09 LAB — FUNGITELL QUALITATIVE: Lab: NEGATIVE

## 2019-03-09 LAB — HEMATOCRIT: Hematocrit:VFr:Pt:Bld:Qn:: 24.1 — ABNORMAL LOW

## 2019-03-09 LAB — PHOSPHORUS: Phosphate:MCnc:Pt:Ser/Plas:Qn:: 2 — ABNORMAL LOW

## 2019-03-09 LAB — DOHLE BODIES

## 2019-03-09 LAB — SODIUM
Sodium:SCnc:Pt:Ser/Plas:Qn:: 123 — ABNORMAL LOW
Sodium:SCnc:Pt:Ser/Plas:Qn:: 128 — ABNORMAL LOW

## 2019-03-09 NOTE — Unmapped (Signed)
Pt is alert &disoriented to situation. Cognitive delayed.  VSS, O2 sats >90% on BIPAP when arrived from [facility], switched to [facility] 8L. Pt removed his BIPAP and desat to 70s%. Febrile, T max was 38C, trending up. Paged MD and they added PRN tylenol, see the parameter. Reported back pain of 5/10, paged MD and new order added.  UO had a lot of output w/ foley. Given 2X lasix prior to admission. Pt has fluid restriction of .  No BM this shift. Poor PO intake. Q2H turns and standard precautions maintained. No falls/injuries this shift. Family at bedside. All monitors with appropriate alarm settings, call bell within reach, will continue to monitor patient.      Problem: Adult Inpatient Plan of Care  Goal: Plan of Care Review  Outcome: Ongoing - Unchanged  Goal: Patient-Specific Goal (Individualization)  Outcome: Ongoing - Unchanged  Goal: Absence of Hospital-Acquired Illness or Injury  Outcome: Ongoing - Unchanged  Goal: Optimal Comfort and Wellbeing  Outcome: Ongoing - Unchanged  Goal: Readiness for Transition of Care  Outcome: Ongoing - Unchanged  Goal: Rounds/Family Conference  Outcome: Ongoing - Unchanged     Problem: Wound  Goal: Optimal Wound Healing  Outcome: Ongoing - Unchanged     Problem: Fall Injury Risk  Goal: Absence of Fall and Fall-Related Injury  Outcome: Ongoing - Unchanged     Problem: Skin Injury Risk Increased  Goal: Skin Health and Integrity  Outcome: Ongoing - Unchanged     Problem: Respiratory Compromise (Pneumonia)  Goal: Effective Oxygenation and Ventilation  Outcome: Ongoing - Unchanged     Problem: Hypertension Comorbidity  Goal: Blood Pressure in Desired Range  Outcome: Ongoing - Unchanged     Problem: Self-Care Deficit  Goal: Improved Ability to Complete Activities of Daily Living  Outcome: Ongoing - Unchanged     Problem: Infection  Goal: Infection Symptom Resolution  Outcome: Ongoing - Unchanged

## 2019-03-09 NOTE — Unmapped (Signed)
Pt A and O x 4. VSS this shift. O2 stable on BiPap overnight. Pt compliant with BiPap. 1500 ml Fluid restriction. Adequate UOP. No BM. Complaints of back pain controlled with lidocaine patch and tylenol. Febrile at beginning of shift. Controlled with tylenol. Bed alarm on. Falls precautions maintained.   Problem: Adult Inpatient Plan of Care  Goal: Plan of Care Review  Outcome: Ongoing - Unchanged  Goal: Patient-Specific Goal (Individualization)  Outcome: Ongoing - Unchanged  Goal: Absence of Hospital-Acquired Illness or Injury  Outcome: Ongoing - Unchanged  Goal: Optimal Comfort and Wellbeing  Outcome: Ongoing - Unchanged  Goal: Readiness for Transition of Care  Outcome: Ongoing - Unchanged  Goal: Rounds/Family Conference  Outcome: Ongoing - Unchanged     Problem: Wound  Goal: Optimal Wound Healing  Outcome: Ongoing - Unchanged     Problem: Fall Injury Risk  Goal: Absence of Fall and Fall-Related Injury  Outcome: Ongoing - Unchanged     Problem: Skin Injury Risk Increased  Goal: Skin Health and Integrity  Outcome: Ongoing - Unchanged     Problem: Respiratory Compromise (Pneumonia)  Goal: Effective Oxygenation and Ventilation  Outcome: Ongoing - Unchanged     Problem: Hypertension Comorbidity  Goal: Blood Pressure in Desired Range  Outcome: Ongoing - Unchanged     Problem: Self-Care Deficit  Goal: Improved Ability to Complete Activities of Daily Living  Outcome: Ongoing - Unchanged     Problem: Infection  Goal: Infection Symptom Resolution  Outcome: Ongoing - Unchanged

## 2019-03-09 NOTE — Unmapped (Signed)
Med E1 Inpatient Service  Daily Progress Note     Interval/Overnight/Subjective: Cultures re-drawn yesterday due to borderline temps, NGTD. Weaned from Ventimask/BiPAP to 3l [place] this afternoon with fluid restriction. Held 1/2 doses of lasix today d/t -neg ~4l yesterday; re-dosed in afternoon. Multiple bowel movements in afternoon (6+) w/ Miralax/Senna. C/o chronic left knee pain, given Tylenol.     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 man with a history of COPD and recently diagnosed AML with course complicated by treatment related pancytopenia and GI bleed who presents as a transfer from [facility] for management of AHRF and elevated LFTs. Fevers and rash resolving. Increased O2 requirements c/f PNA/volume overload/COPD exacerbation also improving with diuresis, antibiotics and prednisone. Acute on Chronic Hypoxic Respiratory Failure - PNA - Volume Overload:  AHRF likely multifactorial from multifocal pneumonia, COPD, and volume overload.  Per sister, pt presented with 1 day of dyspnea, stopped wearing his home O2, and no pulse ox at home. On 2L supplemental oxygen at b/l. Hypoxic at OSH requiring HFNC. CXR with evidence of pulmonary edema and trace left pleural effusion. Evening of transfer developed fever to 38.C raising concern for PNA. History of COPD having recently completed an extended steroid taper for exacerbation on prior admission and has poor airflow on exam. Also with 3+ pitting edema of the lower extremities suggesting component of volume overload and pro-BNP of 4240 compared to 88-100 on prior labs. Respiratory Pathogen Panel, SARS-CoV-2 PCR negative. CT Chest Noncon (10/26) w/ multifocal nodular and consolidative opacities, compatible with bronchopneumonia of possible viral etiology. Had intermittent returns to home O2 requirements during admission with diuresis, antibiotics, prednisone and nebulizer treatments, however followed by sudden increase in O2 requirements. Fluid restriction started due to excess PO fluid intake to maintain goal of net -1L. Again febrile (10/31), with repeat CT chest w/o contrast notable for interval improvement of patchy bilateral peribronchial nodular and GGO. Weaned to 3l [place] (11/1).  - Continue treatment for CAP (cefepime 10/26-10/29); Zosyn (10/29-), will hold atypical cvg given negative RPP  - Prednisone 40 mg for COPD exacerbation (10/26 - 30)  - Hold Duonebs to see if contributing to upper extremity tremor  - Close monitoring for recurrence of fever  - Nighttime BiPAP and prn daily for naps and O2 support when needed, wean as tolerated  - Cont Zosyn for 7 day course from start of antibiotics at OSH Fevers - Most likely due to PNA, fevered to 39.4 on 10/26 then slowly defervesced throughout day and had largely been afebrile until evening 10/30 with 1 temp of 38.3 UA unremarkable, Blood cultures drawn and cultures from OSH NG x 24 hours after calling micro to confirm  -Cefepime (10/26 - 10/29)  -Continue Zosyn (extended dosing) (10/29)  -Repeat CT chest as above  -F/u OSH BC from 10/24 (NG x 4 days) on 10/29 and in house BCx NGTD 10/29  -F/u new cultures 10/31      Afib w/ RVR - Pt with afib on 10/29 with rates as high as 170s-180s. No previous documentation of afib, was on metoprolol at home but may have been for CAD rather than for afib. Received 2 doses metoprolol 5 mg and 1 dose IV lasix 40  and converted to NSR within about an hour. Suspect afib was instigated by volume overload  -Metoprolol tartrate 12.5 mg BID  -Diuresis as above     Upper extremity tremor - S/p stroke w/u for L facial droop Patient developed acute-onset left-sided facial droop and LUE weakness on day of admission 10/27. Evaluated by Neurology NIHSS 8 initially, CTH negative. 24 hours of video EEG completed and without focal seziures, only showed difuse slowing. Neurology signed off. Droop largely resolved.. Mental status seems at baseline (hx of intellectual disability). However has had pronounced upper extremity tremors new on this admission   - Continue zosyn (cefepime DCed to r/o neurotoxicity)  - HOLD duonebs in case tremor is side effect of albuterol  - Reduce centrally acting meds: hold celexa Elevated LFTs: Characterized by mild hyperbilirubinemia and transaminase elevations.TBili first noted to be elevated on 10/20, AST/ALT/AlkPhos elevation since 10/21. Likely drug induced. Could be related to azacitidine which can cause transient elevation of liver enzymes. Also discharged on amicare which may have exacerbated transaminitis. Hep B/C and HIV were negative in 11/2017. Most recent abdominal imaging (CT) showed hepatosplenomegaly but no evidence of cirrhosis. RUQ [place] without biliary duct dilation, showed mild hepatomegaly, s/p cholecystectomy. Liver function labs downtrending.  -Daily CMP  -Holding amicar, CTM     Ileus: Dilated loops of bowel on KUB concerning for partial SBO vs ileus after transient report of abdominal pain. Pt with history of going through cycles of constipation followed by diarrhea. Sister reports he did have one dark (chronic melena) BM on 10/25 prior to presentation to hospital. Often in house has frequent BMs but with straining so will continue aggressive BM. Intermittent episodes of frequent BM followed by absence of BM.  -Miralax TID  -Senna BID    Diffuse Rash - Facial edema (resolving)- suspect mild drug allergy to vanc vs. Amicar. Swelling and itching improved with topical steroid cream, prednisone for COPD exacerbation, and 1x benadryl. Rash still present with diffuse erythematous macules on trunk but improving. Circumscribed lesions on back likely psoriatic given hx of psoriasis per chart review.  -Avoid vanc for now  -Supportive care with topical cream  -WOCN consulting    L eye swelling/drainage (Stable)- Purulent drainage on admission  -Polymixin eye drops for bacterial conjuncitvitis (7 day course) Adverse Risk AML: Follows with Dr. Vertell Limber. BMBx on 01/15/2019 was hypercellular (70%) with 30% blasts. He has complex cytogenetics and TP53 mutation. He was started on Azacitdine/Venetoclax, C1D1 = 10/6. Course complicated by pancytopenia and GI bleeding. He has a poor prognosis with and without chemo. Began to discuss pt's goals of care with pt and sister- pt with poor understanding of risks vs. Benefits of chemo and only short term outlook I want to go home. Unfortunately it has been difficult to get in touch with pt's HPOA (he designates his sister [name]) to discuss long term plans and she is unable to come to bedside this weekend. Initially planned for BM biopsy on 10/30 but she denied consent when heme fellow called her. She seems to be uncertain as to whether or not it is worth continuing venetoclax vs. Having pt go home with hospice. We will continue to try discussions with her depending on patient stability about pursuing repeat BM biopsy to guide whether or not to continue treatment vs just stopping all aggressive therapy now.   - Continue goals of care discussions re: risks vs. benefits of continuing chemo given multiple recent hospitalizations and poor prognosis as well as often tenuous respiratory  status  - Cont Venetoclax (held on admission, restarted on 10/30)    Chronic Medical Problems:  HTN: Holding amlodipine 10 mg daily  CAD: Stents last placed on 02/2016 with reported EF of 50%. Most recent heart cath on 01/2018 was unremarkable. Holding IMDUR, diuresing as above, metoprolol tartrate 12.5 mg BID in place of home metoprolol succinate 25 mg daily  Chronic Pain: Continue home pain regimen    Daily Checklist:  Access: PIV  Diet:??Regular  Electrolytes:??Replete PRN  DVT PPx: Contraindicated due to thrombocytopenia  GI PPx: Not Indicated  Code Status: Full Code (for now, pt without capacity to change code status - need to discuss with health care proxy [name])  Dispo:??Stepdown, Med E1 _______________________________________________________________    Objective:     Vital Signs:  Temp:  [36.4 ??C-38 ??C] 37.1 ??C  Heart Rate:  [58-83] 75  SpO2 Pulse:  [58-83] 75  Resp:  [14-29] 21  BP: (114-140)/(32-54) 137/52  MAP (mmHg):  [55-82] 82  FiO2 (%):  [40 %] 40 %  SpO2:  [98 %-100 %] 100 %    Physical Exam: (as of AM 10/31)  General: Chronically ill-appearing male, NAD  HEENT: Head: normocephalic, atraumatic. Left eyelid swollen, eye with less drainage and able to open easier compared to admission. Eyelid erythematous.  Chest/Lungs: Mildly tachypneic on 3 L [address], lungs with rhonchi most prominent at the bases, no wheezing   Heart: regular rate rhythm, normal S1, S2, no murmurs, rubs, clicks or gallops  Abdomen: soft, nontender, nondistended, no masses  Extremities: bilateral LE edema 2+ pitting to knee, WWP  Skin: interval improvement in urticarial rash  Psychiatric: At baseline, continues to repeat when can I go home    Diagnostic Studies:  I reviewed all pertinent diagnostic studies.  ______________

## 2019-03-09 NOTE — Unmapped (Signed)
""  Order was placed for a \""PIV by Venous Access Team (VAT)\"".  Patient was assessed at bedside for placement of a PIV. Mask and protective eyewear were donned. Access was obtained. Blood return noted.  Dressing intact and device well secured.  Flushed with normal saline.  Pt advised to inform RN of any s/s of discomfort at the PIV site.    Workup / Procedure Time:  30 minutes            Thank you,     Lorelle Formosa RN Venous Access Team""

## 2019-03-10 ENCOUNTER — Other Ambulatory Visit: Payer: Self-pay | Admitting: Internal Medicine

## 2019-03-10 DIAGNOSIS — K5900 Constipation, unspecified: Secondary | ICD-10-CM | POA: Diagnosis not present

## 2019-03-10 DIAGNOSIS — F329 Major depressive disorder, single episode, unspecified: Secondary | ICD-10-CM

## 2019-03-10 DIAGNOSIS — C9200 Acute myeloblastic leukemia, not having achieved remission: Secondary | ICD-10-CM

## 2019-03-10 DIAGNOSIS — J9621 Acute and chronic respiratory failure with hypoxia: Secondary | ICD-10-CM | POA: Diagnosis not present

## 2019-03-10 DIAGNOSIS — F419 Anxiety disorder, unspecified: Secondary | ICD-10-CM

## 2019-03-10 LAB — COMPREHENSIVE METABOLIC PANEL
ALBUMIN: 2.1 g/dL — ABNORMAL LOW (ref 3.5–5.0)
ALBUMIN: 2.1 g/dL — ABNORMAL LOW (ref 3.5–5.0)
ALKALINE PHOSPHATASE: 160 U/L — ABNORMAL HIGH (ref 38–126)
ALT (SGPT): 69 U/L — ABNORMAL HIGH (ref <50)
ALT (SGPT): 61 U/L — ABNORMAL HIGH (ref <50)
AST (SGOT): 73 U/L — ABNORMAL HIGH (ref 19–55)
AST (SGOT): 55 U/L (ref 19–55)
BILIRUBIN TOTAL: 1.4 mg/dL — ABNORMAL HIGH (ref 0.0–1.2)
BILIRUBIN TOTAL: 1.4 mg/dL — ABNORMAL HIGH (ref 0.0–1.2)
BLOOD UREA NITROGEN: 17 mg/dL (ref 7–21)
BLOOD UREA NITROGEN: 14 mg/dL (ref 7–21)
BUN / CREAT RATIO: 30
BUN / CREAT RATIO: 25
CALCIUM: 6.9 mg/dL — ABNORMAL LOW (ref 8.5–10.2)
CALCIUM: 7.1 mg/dL — ABNORMAL LOW (ref 8.5–10.2)
CHLORIDE: 82 mmol/L — ABNORMAL LOW (ref 98–107)
CHLORIDE: 81 mmol/L — ABNORMAL LOW (ref 98–107)
CO2: >40 mmol/L (ref 22.0–30.0)
CO2: >40 mmol/L (ref 22.0–30.0)
CREATININE: 0.57 mg/dL — ABNORMAL LOW (ref 0.70–1.30)
CREATININE: 0.56 mg/dL — ABNORMAL LOW (ref 0.70–1.30)
EGFR CKD-EPI AA MALE: >90 mL/min/{1.73_m2} (ref >=60)
EGFR CKD-EPI AA MALE: >90 mL/min/{1.73_m2} (ref >=60)
EGFR CKD-EPI NON-AA MALE: >90 mL/min/{1.73_m2} (ref >=60)
EGFR CKD-EPI NON-AA MALE: >90 mL/min/{1.73_m2} (ref >=60)
GLUCOSE RANDOM: 115 mg/dL (ref 70–179)
GLUCOSE RANDOM: 93 mg/dL (ref 70–179)
POTASSIUM: 3.3 mmol/L — ABNORMAL LOW (ref 3.5–5.0)
PROTEIN TOTAL: 4.8 g/dL — ABNORMAL LOW (ref 6.5–8.3)
PROTEIN TOTAL: 4.8 g/dL — ABNORMAL LOW (ref 6.5–8.3)
SODIUM: 126 mmol/L — ABNORMAL LOW (ref 135–145)
SODIUM: 125 mmol/L — ABNORMAL LOW (ref 135–145)

## 2019-03-10 LAB — MAGNESIUM: Magnesium:MCnc:Pt:Ser/Plas:Qn:: 2.3 — ABNORMAL HIGH

## 2019-03-10 LAB — AST (SGOT): Aspartate aminotransferase:CCnc:Pt:Ser/Plas:Qn:: 55

## 2019-03-10 LAB — CBC W/ AUTO DIFF
BASOPHILS ABSOLUTE COUNT: 0 10*9/L (ref 0.0–0.1)
BASOPHILS RELATIVE PERCENT: 1.7 %
EOSINOPHILS ABSOLUTE COUNT: 0 10*9/L (ref 0.0–0.4)
EOSINOPHILS RELATIVE PERCENT: 1.5 %
HEMOGLOBIN: 7.4 g/dL — ABNORMAL LOW (ref 13.5–17.5)
LARGE UNSTAINED CELLS: 22 % — ABNORMAL HIGH (ref 0–4)
LYMPHOCYTES ABSOLUTE COUNT: 0.2 10*9/L — ABNORMAL LOW (ref 1.5–5.0)
LYMPHOCYTES RELATIVE PERCENT: 47.3 %
MEAN CORPUSCULAR HEMOGLOBIN CONC: 32.6 g/dL (ref 31.0–37.0)
MEAN CORPUSCULAR HEMOGLOBIN: 30.8 pg (ref 26.0–34.0)
MEAN CORPUSCULAR VOLUME: 94.4 fL (ref 80.0–100.0)
MEAN PLATELET VOLUME: 10 fL (ref 7.0–10.0)
MONOCYTES ABSOLUTE COUNT: 0.1 10*9/L — ABNORMAL LOW (ref 0.2–0.8)
MONOCYTES RELATIVE PERCENT: 9 %
NEUTROPHILS ABSOLUTE COUNT: 0.1 10*9/L — CL (ref 2.0–7.5)
NEUTROPHILS RELATIVE PERCENT: 19 %
NUCLEATED RED BLOOD CELLS: 13 /100{WBCs} — ABNORMAL HIGH (ref <=4)
PLATELET COUNT: 134 10*9/L — ABNORMAL LOW (ref 150–440)
RED CELL DISTRIBUTION WIDTH: 17.8 % — ABNORMAL HIGH (ref 12.0–15.0)
WBC ADJUSTED: 0.5 10*9/L — ABNORMAL LOW (ref 4.5–11.0)

## 2019-03-10 LAB — SODIUM URINE: Lab: 66

## 2019-03-10 LAB — MONOCYTES ABSOLUTE COUNT: Monocytes:NCnc:Pt:Bld:Qn:Automated count: 0.1 — ABNORMAL LOW

## 2019-03-10 LAB — PROTEIN TOTAL: Protein:MCnc:Pt:Ser/Plas:Qn:: 4.8 — ABNORMAL LOW

## 2019-03-10 LAB — BLASTOMYCES AB: Blastomyces dermatitidis Ab:PrThr:Pt:Ser:Ord:: NEGATIVE

## 2019-03-10 LAB — OSMOLALITY URINE: Lab: 384

## 2019-03-10 LAB — URIC ACID: Urate:MCnc:Pt:Ser/Plas:Qn:: 1.5 — ABNORMAL LOW

## 2019-03-10 LAB — PHOSPHORUS: Phosphate:MCnc:Pt:Ser/Plas:Qn:: 2.5 — ABNORMAL LOW

## 2019-03-10 LAB — SLIDE REVIEW

## 2019-03-10 LAB — SMEAR REVIEW

## 2019-03-10 MED ORDER — POSACONAZOLE 100 MG PO TBEC: 300.0000 mg | DELAYED_RELEASE_TABLET | Freq: Every day | ORAL | Status: AC

## 2019-03-10 MED ORDER — AMINOCAPROIC ACID 1000 MG PO TABS: 1000.0000 mg | ORAL_TABLET | Freq: Three times a day (TID) | ORAL | 0 refills | Status: AC

## 2019-03-10 MED ORDER — CITALOPRAM HYDROBROMIDE 20 MG PO TABS: 20.0000 mg | ORAL_TABLET | Freq: Every day | ORAL | 3 refills | Status: AC

## 2019-03-10 NOTE — Unmapped (Signed)
Service Date :03/10/2019        Performing Service:MEDICINE::HEMATOLOGY/ONCOLOGY    Patient Location: Clinic         Patient   Name: Raymond [name]   MRNO: [id number]   Age:63 y.o.   Sex:M        Indications   Indications : Hx of AML, concern for relapsed/recurrent disease     Procedure   Time Out: Performed immediately prior to the procedure       Name: Bone Marrow Aspiration and Biopsy      Description:   Informed consent was obtained and formed signed on chart.     Site: right posterior superior iliac spine.     Aseptic preparation, draping, and technique was used.     Anesthesia: 2% Lidocaine. Premedications: None    Needle : Renfrac    Core Biopsy was obtained.     Aspirate was obtained.     Pressure applied and hemostasis achieved. Sterile bandage applied. Patientt instructed to keep bandage dry and in place for the next 24 hours.      Complications   None.     Specimen(s)   Bone marrow aspirate and core biopsy.     Isa Rankin, MD  H/O PGY 4

## 2019-03-10 NOTE — Unmapped (Signed)
Med E1 Inpatient Service  Daily Progress Note     Interval/Overnight/Subjective:    Patient remains in [facility]. Diuresed yesterday with lasix 40 mg x 1 and -630cc. Electrolyte repletion K/Phos overnight. O2 requirement near baseline. Continues to be febrile at night to 38.5C. Multiple small BMs yesterday w/ straining and c/o abdominal pain on bowel regimen, KUB obtained w/o e/o stool burden. Bowel regimen held, will plan to test for C.Diff after 48 hr w/o laxative. BMBx today, 4% blasts on Aza/Venetoclax. Ongoing GOC discussion with sister Raymond [name] regarding plan for continuation of chemotherapy versus home home hospice.    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 man with a history of COPD and recently diagnosed AML with course complicated by treatment related pancytopenia and GI bleed who presents as a transfer from [facility] for management of AHRF and elevated LFTs. Fevers and rash resolving. Increased O2 requirements c/f PNA/volume overload/COPD exacerbation also improving with diuresis, antibiotics and prednisone. Acute on Chronic Hypoxic Respiratory Failure - PNA - Volume Overload:  AHRF likely multifactorial from multifocal pneumonia, COPD, and volume overload.  Per sister, pt presented with 1 day of dyspnea, stopped wearing his home O2, and no pulse ox at home. On 2L supplemental oxygen at b/l. Hypoxic at OSH requiring HFNC. CXR with evidence of pulmonary edema and trace left pleural effusion. Evening of transfer developed fever to 38.C raising concern for PNA. History of COPD having recently completed an extended steroid taper for exacerbation on prior admission and has poor airflow on exam. Also with 3+ pitting edema of the lower extremities suggesting component of volume overload and pro-BNP of 4240 compared to 88-100 on prior labs. Respiratory Pathogen Panel, SARS-CoV-2 PCR negative. CT Chest Noncon (10/26) w/ multifocal nodular and consolidative opacities, compatible with bronchopneumonia of possible viral etiology. Had intermittent returns to home O2 requirements during admission with diuresis, antibiotics, prednisone and nebulizer treatments, however followed by sudden increase in O2 requirements. Fluid restriction started due to excess PO fluid intake to maintain goal of net -1L. Again febrile (10/31), with repeat CT chest w/o contrast notable for interval improvement of patchy bilateral peribronchial nodular and GGO. Weaned to 3l  (11/1).  - Continue treatment for CAP (cefepime 10/26-10/29); Zosyn (10/29-), will hold atypical cvg given negative RPP  - Prednisone 40 mg for COPD exacerbation (10/26 - 30)  - Hold Duonebs to see if contributing to upper extremity tremor  - Close monitoring for recurrence of fever  - Nighttime BiPAP and prn daily for naps and O2 support when needed, wean as tolerated  - Cont Zosyn for 7 day course from start of antibiotics at OSH  - switched from IV to PO lasix 11/2 (2 doses 40 mg), continue to assess PO lasix needs for going home (home dose 40 mg daily) - Monitor fever curve, potentially to extend zosyn end date if cont'd fevers  - CT A/P if not improving  - Consider foley removal tomorrow    Hyponatremia:   - f/u repeat urine studies for hypoNa     Fevers - Most likely due to PNA, fevered to 39.4 on 10/26 then slowly defervesced throughout day and had largely been afebrile until evening 10/30 with 1 temp of  38.3 UA unremarkable, Blood cultures drawn and cultures from OSH NG x 24 hours after calling micro to confirm  -Cefepime (10/26 - 10/29)  -Continue Zosyn (extended dosing) (10/29)  -Repeat CT chest as above  -F/u OSH BC from 10/24 (NG x 4 days) on 10/29 and in house BCx NGTD 10/29  -F/u new cultures 10/31      Afib w/ RVR - Pt with afib on 10/29 with rates as high as 170s-180s. No previous documentation of afib, was on metoprolol at home but may have been for CAD rather than for afib. Received 2 doses metoprolol 5 mg and 1 dose IV lasix 40 and converted to NSR within about an hour. Suspect afib was instigated by volume overload  -Metoprolol tartrate 12.5 mg BID  -Diuresis as above     Upper extremity tremor - S/p stroke w/u for L facial droop Patient developed acute-onset left-sided facial droop and LUE weakness on day of admission 10/27. Evaluated by Neurology NIHSS 8 initially, CTH negative. 24 hours of video EEG completed and without focal seziures, only showed difuse slowing. Neurology signed off. Droop largely resolved.. Mental status seems at baseline (hx of intellectual disability). However has had pronounced upper extremity tremors new on this admission   - Continue zosyn (cefepime DCed to r/o neurotoxicity)  - HOLD duonebs in case tremor is side effect of albuterol  - Reduce centrally acting meds: hold celexa Elevated LFTs: Characterized by mild hyperbilirubinemia and transaminase elevations.TBili first noted to be elevated on 10/20, AST/ALT/AlkPhos elevation since 10/21. Likely drug induced. Could be related to azacitidine which can cause transient elevation of liver enzymes. Also discharged on amicare which may have exacerbated transaminitis. Hep B/C and HIV were negative in 11/2017. Most recent abdominal imaging (CT) showed hepatosplenomegaly but no evidence of cirrhosis. RUQ [place] without biliary duct dilation, showed mild hepatomegaly, s/p cholecystectomy. Liver function labs downtrending.  -Daily CMP   -Holding amicar, CTM     Ileus: Dilated loops of bowel on KUB concerning for partial SBO vs ileus after transient report of abdominal pain. Pt with history of going through cycles of constipation followed by diarrhea. Sister reports he did have one dark (chronic melena) BM on 10/25 prior to presentation to hospital. Often in house has frequent BMs but with straining so will continue aggressive BM. Intermittent episodes of frequent BM followed by absence of BM.  -Miralax BID  -Senna BID  - C diff 11/3 after off laxatives x 48h     Diffuse Rash - Facial edema (resolving)- suspect mild drug allergy to vanc vs. Amicar. Swelling and itching improved with topical steroid cream, prednisone for COPD exacerbation, and 1x benadryl. Rash still present with diffuse erythematous macules on trunk but improving. Circumscribed lesions on back likely psoriatic given hx of psoriasis per chart review.  -Avoid vanc for now  -Supportive care with topical cream  -WOCN consulting    L eye swelling/drainage (Stable)- Purulent drainage on admission  -Polymixin eye drops for bacterial conjuncitvitis (7 day course) Adverse Risk AML: Follows with Dr. Vertell Limber. BMBx on 01/15/2019 was hypercellular (70%) with 30% blasts. He has complex cytogenetics and TP53 mutation. He was started on Azacitdine/Venetoclax, C1D1 = 10/6. Course complicated by pancytopenia and GI bleeding. He has a poor prognosis with and without chemo. Began to discuss pt's goals of care with pt and sister- pt with poor understanding of risks vs. Benefits of chemo and only short term outlook I want to go home. Unfortunately it has been difficult to get  in touch with pt's HPOA (he designates his sister [name]) to discuss long term plans and she is unable to come to bedside this weekend. Initially planned for BM biopsy on 10/30 but she denied consent when heme fellow called her. She seems to be uncertain as to whether or not it is worth continuing venetoclax vs. Having pt go home with hospice. We will continue to try discussions with her depending on patient stability about pursuing repeat BM biopsy to guide whether or not to continue treatment vs just stopping all aggressive therapy now.   - Continue goals of care discussions re: risks vs. benefits of continuing chemo given multiple recent hospitalizations and poor prognosis as well as often tenuous respiratory status  - Cont Venetoclax (held on admission, restarted on 10/30)  - GOC -> prelim BM 5% blasts, discussing cancer plan w/ HPOA sister [name]     Chronic Medical Problems:  HTN: Holding amlodipine 10 mg daily  CAD: Stents last placed on 02/2016 with reported EF of 50%. Most recent heart cath on 01/2018 was unremarkable. Holding IMDUR, diuresing as above, metoprolol tartrate 12.5 mg BID in place of home metoprolol succinate 25 mg daily  Chronic Pain: Continue home pain regimen    Daily Checklist:  Access: PIV  Diet:??Regular  Electrolytes:??Replete PRN  DVT PPx: Contraindicated due to thrombocytopenia  GI PPx: Not Indicated Code Status: Full Code (for now, pt without capacity to change code status - need to discuss with health care proxy [name])  Dispo:??Stepdown, Med E1    _______________________________________________________________    Objective:     Vital Signs:  Temp:  [36.9 ??C-38.5 ??C] 38.5 ??C  Heart Rate:  [69-84] 75  SpO2 Pulse:  [72-82] 75  Resp:  [19-30] 28  BP: (108-146)/(36-99) 115/45  MAP (mmHg):  [61-104] 68  FiO2 (%):  [35 %-40 %] 35 %  SpO2:  [91 %-100 %] 95 %    Physical Exam: (as of AM 10/31)  General: Chronically ill-appearing male, NAD  HEENT: Head: normocephalic, atraumatic. Left eyelid swollen, eye with less drainage and able to open easier compared to admission. Eyelid erythematous.  Chest/Lungs: Mildly tachypneic on 3 L [place], lungs with rhonchi most prominent at the bases, no wheezing   Heart: regular rate rhythm, normal S1, S2, no murmurs, rubs, clicks or gallops  Abdomen: soft, nontender, nondistended, no masses  Extremities: bilateral LE edema 2+ pitting to knee, WWP  Skin: interval improvement in urticarial rash  Psychiatric: At baseline, continues to repeat when can I go home    Diagnostic Studies:  I reviewed all pertinent diagnostic studies.  ______________

## 2019-03-10 NOTE — Unmapped (Signed)
Problem: Respiratory Compromise (Pneumonia)  Goal: Effective Oxygenation and Ventilation  Outcome: Progressing  Intervention: Optimize Oxygenation and Ventilation  Flowsheets (Taken 03/10/2019 0522)  Head of Bed (HOB): HOB at 30-45 degrees   Patient continues to use NPPV with good compliance.

## 2019-03-10 NOTE — Unmapped (Addendum)
A&Ox4; VSS on 3L [place]; no complaints of pain; foley maintained; fall precautions in place; SCDs for VTE prophylaxis; will continue to monitor

## 2019-03-11 LAB — CBC W/ AUTO DIFF
BASOPHILS ABSOLUTE COUNT: 0 10*9/L (ref 0.0–0.1)
BASOPHILS RELATIVE PERCENT: 1.2 %
EOSINOPHILS ABSOLUTE COUNT: 0 10*9/L (ref 0.0–0.4)
EOSINOPHILS RELATIVE PERCENT: 0.1 %
HEMATOCRIT: 23.3 % — ABNORMAL LOW (ref 41.0–53.0)
HEMOGLOBIN: 7.5 g/dL — ABNORMAL LOW (ref 13.5–17.5)
LARGE UNSTAINED CELLS: 19 % — ABNORMAL HIGH (ref 0–4)
LYMPHOCYTES ABSOLUTE COUNT: 0.3 10*9/L — ABNORMAL LOW (ref 1.5–5.0)
LYMPHOCYTES RELATIVE PERCENT: 41.7 %
MEAN CORPUSCULAR HEMOGLOBIN CONC: 32.2 g/dL (ref 31.0–37.0)
MEAN PLATELET VOLUME: 9.9 fL (ref 7.0–10.0)
MONOCYTES ABSOLUTE COUNT: 0.1 10*9/L — ABNORMAL LOW (ref 0.2–0.8)
MONOCYTES RELATIVE PERCENT: 11.1 %
NEUTROPHILS ABSOLUTE COUNT: 0.2 10*9/L — CL (ref 2.0–7.5)
NEUTROPHILS RELATIVE PERCENT: 27.3 %
NUCLEATED RED BLOOD CELLS: 8 /100{WBCs} — ABNORMAL HIGH (ref <=4)
PLATELET COUNT: 176 10*9/L (ref 150–440)
RED BLOOD CELL COUNT: 2.47 10*12/L — ABNORMAL LOW (ref 4.50–5.90)
RED CELL DISTRIBUTION WIDTH: 17.8 % — ABNORMAL HIGH (ref 12.0–15.0)
WBC ADJUSTED: 0.7 10*9/L — ABNORMAL LOW (ref 4.5–11.0)

## 2019-03-11 LAB — GIANT PLATELETS

## 2019-03-11 LAB — COMPREHENSIVE METABOLIC PANEL
ALBUMIN: 2.3 g/dL — ABNORMAL LOW (ref 3.5–5.0)
ALKALINE PHOSPHATASE: 161 U/L — ABNORMAL HIGH (ref 38–126)
ALT (SGPT): 67 U/L — ABNORMAL HIGH (ref <50)
ANION GAP: <1 mmol/L — ABNORMAL LOW (ref 7–15)
AST (SGOT): 60 U/L — ABNORMAL HIGH (ref 19–55)
BILIRUBIN TOTAL: 1.6 mg/dL — ABNORMAL HIGH (ref 0.0–1.2)
BUN / CREAT RATIO: 21
CALCIUM: 7 mg/dL — ABNORMAL LOW (ref 8.5–10.2)
CHLORIDE: 86 mmol/L — ABNORMAL LOW (ref 98–107)
CO2: 40 mmol/L (ref 22.0–30.0)
CREATININE: 0.61 mg/dL — ABNORMAL LOW (ref 0.70–1.30)
EGFR CKD-EPI AA MALE: >90 mL/min/{1.73_m2} (ref >=60)
EGFR CKD-EPI NON-AA MALE: >90 mL/min/{1.73_m2} (ref >=60)
GLUCOSE RANDOM: 80 mg/dL (ref 70–179)
POTASSIUM: 3.5 mmol/L (ref 3.5–5.0)
PROTEIN TOTAL: 5.3 g/dL — ABNORMAL LOW (ref 6.5–8.3)
SODIUM: 126 mmol/L — ABNORMAL LOW (ref 135–145)

## 2019-03-11 LAB — MAGNESIUM: Magnesium:MCnc:Pt:Ser/Plas:Qn:: 1.8

## 2019-03-11 LAB — SLIDE REVIEW

## 2019-03-11 LAB — LARGE UNSTAINED CELLS: Lab: 19 — ABNORMAL HIGH

## 2019-03-11 LAB — ASPERGILLUS AG SERUM: Lab: <0.5

## 2019-03-11 LAB — ALBUMIN: Albumin:MCnc:Pt:Ser/Plas:Qn:: 2.3 — ABNORMAL LOW

## 2019-03-11 LAB — PHOSPHORUS: Phosphate:MCnc:Pt:Ser/Plas:Qn:: 3.2

## 2019-03-11 LAB — PATHOLOGIST SMEAR INTERPRETATION

## 2019-03-11 NOTE — Unmapped (Signed)
Pt is Alert & anxious &Ox3. VSS. On 4L [place]. Afebrile. Pt c/o of pain in his back & was controlled with PRN tylenol. UO adequate via foley, 1 small BM this shift. Pt's appetite has been very poor. Q2H turns and standard precautions maintained. Pt had a chest XR & a liver biopsy done at bedside. Pt has been very anxious all day & constantly calling out & requesting to go home/threatening to leave AMA. Providers notified & came to bedside. Pt continued to pull off monitors all day & would not allow [place] to put them on. No falls/injuries this shift. Sister was called & updated. Will continue to monitor & maintain pt safety.         Problem: Adult Inpatient Plan of Care  Goal: Plan of Care Review  Outcome: Ongoing - Unchanged  Goal: Patient-Specific Goal (Individualization)  Outcome: Ongoing - Unchanged  Goal: Absence of Hospital-Acquired Illness or Injury  Outcome: Ongoing - Unchanged  Goal: Optimal Comfort and Wellbeing  Outcome: Ongoing - Unchanged  Goal: Readiness for Transition of Care  Outcome: Ongoing - Unchanged  Goal: Rounds/Family Conference  Outcome: Ongoing - Unchanged     Problem: Wound  Goal: Optimal Wound Healing  Outcome: Ongoing - Unchanged     Problem: Fall Injury Risk  Goal: Absence of Fall and Fall-Related Injury  Outcome: Ongoing - Unchanged     Problem: Skin Injury Risk Increased  Goal: Skin Health and Integrity  Outcome: Ongoing - Unchanged     Problem: Respiratory Compromise (Pneumonia)  Goal: Effective Oxygenation and Ventilation  Outcome: Ongoing - Unchanged     Problem: Hypertension Comorbidity  Goal: Blood Pressure in Desired Range  Outcome: Ongoing - Unchanged     Problem: Self-Care Deficit  Goal: Improved Ability to Complete Activities of Daily Living  Outcome: Ongoing - Unchanged     Problem: Infection  Goal: Infection Symptom Resolution  Outcome: Ongoing - Unchanged     Problem: Noninvasive Ventilation Acute  Goal: Effective Unassisted Ventilation and Oxygenation Outcome: Ongoing - Unchanged

## 2019-03-11 NOTE — Unmapped (Signed)
Pt refused BIPAP and equipment.

## 2019-03-11 NOTE — Unmapped (Signed)
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 *    Raymond [name]??is a 63 y.o.??man with a history of COPD and recently diagnosed AML with course complicated by treatment related pancytopenia and GI bleed who presents as a transfer from [facility] for management of AHRF and elevated LFTs. Fevers and rash resolving. Increased O2 requirements c/f PNA/volume overload/COPD exacerbation also improving with diuresis, antibiotics and prednisone. Acute on Chronic Hypoxic Respiratory Failure - PNA - Volume Overload:  AHRF likely multifactorial from multifocal pneumonia, COPD, and volume overload.  Per sister, pt presented with 1 day of dyspnea, stopped wearing his home O2, and no pulse ox at home. On 2L supplemental oxygen at b/l. Hypoxic at OSH requiring HFNC.??CXR??with evidence of pulmonary edema and trace left pleural effusion.??Evening of transfer developed fever to 38.C raising concern for PNA. History of COPD having recently completed an extended steroid taper for exacerbation on prior admission and has poor airflow on exam. Also with 3+ pitting edema of the lower extremities suggesting component of volume overload and pro-BNP of 4240 compared to 88-100 on prior labs. Respiratory Pathogen Panel, SARS-CoV-2 PCR negative. CT Chest Noncon (10/26) w/ multifocal nodular and consolidative opacities, compatible with bronchopneumonia of possible viral etiology. Had intermittent returns to home O2 requirements during admission with diuresis, antibiotics, prednisone and nebulizer treatments, however followed by sudden increase in O2 requirements. Fluid restriction started due to excess PO fluid intake to maintain goal of net -1L. Again febrile (10/31), with repeat CT chest w/o contrast notable for interval improvement of patchy bilateral peribronchial nodular and GGO. Weaned to 3l [place] (11/1).  - Continue treatment for CAP (cefepime 10/26-10/29); Zosyn (10/29-11/3), will hold atypical cvg given negative RPP  - Augmentin (11/3- )  - Prednisone 40 mg for COPD exacerbation (10/26 - 30)  - Hold Duonebs to see if contributing to upper extremity tremor  - Close monitoring for recurrence of fever  - Nighttime BiPAP and prn daily for naps and O2 support when needed, wean as tolerated  - Cont Zosyn for 7 day course from start of antibiotics at OSH  - 40mg  PO Lasix daily  - Monitor fever curve, potentially to extend zosyn end date if cont'd fevers  - CT A/P if not improving ??  Hyponatremia:   - UOsm 384, Una 66, likely 2/2 volume overload  - Will continue diuresis and fluid restriction  ??  Fevers (resolving) - Most likely due to PNA, fevered to 39.4 on 10/26 then slowly defervesced throughout day and had largely been afebrile until evening 10/30 with 1 temp of 38.3 UA unremarkable, Blood cultures drawn and cultures from OSH NG x 24 hours after calling micro to confirm  -Cefepime (10/26 - 10/29)  -Continue Zosyn (extended dosing) (10/29)  -Repeat CT chest as above  -F/u OSH BC from 10/24 (NG x 4 days) on 10/29 and in house BCx NGTD 10/29  -Blood Cx NGTD x2  ??  Afib w/ RVR (resolved) - Pt with afib on 10/29 with rates as high as 170s-180s. No previous documentation of afib, was on metoprolol at home but may have been for CAD rather than for afib. Received 2 doses metoprolol 5 mg and 1 dose IV lasix 40 and converted to NSR within about an hour. Suspect afib was instigated by volume overload.  -Metoprolol tartrate 12.5 mg BID  -Diuresis as above   ??  Upper extremity tremor - S/p stroke w/u for L facial droop Patient developed acute-onset left-sided facial droop and LUE weakness on day of admission 10/27. Evaluated by Neurology NIHSS 8 initially, CTH negative. 24 hours of video EEG completed and without focal seziures, only showed difuse slowing. Neurology signed off. Droop largely resolved.. Mental status seems at baseline (hx of intellectual disability). However has had pronounced upper extremity tremors new on this admission   - Continue zosyn (cefepime DCed to r/o neurotoxicity)  - HOLD duonebs in case tremor is side effect of albuterol  - Reduce centrally acting meds: hold celexa Elevated LFTs:??Characterized by mild hyperbilirubinemia and transaminase elevations.TBili first noted to be elevated on 10/20, AST/ALT/AlkPhos elevation since 10/21.??Likely drug induced. Could be related to azacitidine which can cause transient elevation of liver enzymes. Also discharged on amicare which may have exacerbated transaminitis. Hep B/C and HIV were negative in 11/2017. Most recent abdominal imaging (CT) showed hepatosplenomegaly but no evidence of cirrhosis. RUQ [place] without biliary duct dilation, showed mild hepatomegaly, s/p cholecystectomy. Liver function labs downtrending.  -Daily CMP   -Holding amicar, CTM   ??  Ileus: Dilated loops of bowel on KUB concerning for partial SBO vs ileus after transient report of abdominal pain. Pt with history of going through cycles of constipation followed by diarrhea. Sister reports he did have one dark (chronic melena) BM on 10/25 prior to presentation to hospital. Often in house has frequent BMs but with straining so will continue aggressive BM. Intermittent episodes of frequent BM followed by absence of BM.  - Holding laxatives  - C diff pending  ??  Diffuse Rash - Facial edema (resolving)- suspect mild drug allergy to vanc vs. Amicar. Swelling and itching improved with topical steroid cream, prednisone for COPD exacerbation, and 1x benadryl. Rash still present with diffuse erythematous macules on trunk but improving. Circumscribed lesions on back likely psoriatic given hx of psoriasis per chart review.  -Avoid vanc for now  -Supportive care with topical cream  -WOCN consulting  ??  L eye swelling/drainage (Stable)- Purulent drainage on admission  -Polymixin eye drops for bacterial conjuncitvitis (7 day course) Adverse Risk AML: Follows with Dr. Vertell Limber. BMBx on 01/15/2019 was hypercellular (70%) with 30% blasts. He has complex cytogenetics and TP53 mutation. He was started on Azacitdine/Venetoclax, C1D1 = 10/6. Course complicated by pancytopenia and GI bleeding. He has a poor prognosis with and without chemo. Began to discuss pt's goals of care with pt and sister- pt with poor understanding of risks vs. Benefits of chemo and only short term outlook I want to go home. Unfortunately it has been difficult to get in touch with pt's HPOA (he designates his sister Raymond [name]) to discuss long term plans and she is unable to come to bedside this weekend. Initially planned for BM biopsy on 10/30 but she denied consent when heme fellow called her. She seems to be uncertain as to whether or not it is worth continuing venetoclax vs. Having pt go home with hospice. We will continue to try discussions with her depending on  patient stability about pursuing repeat BM biopsy to guide whether or not to continue treatment vs just stopping all aggressive therapy now.   - Continue goals of care discussions re: risks vs. benefits of continuing chemo given multiple recent hospitalizations and poor prognosis as well as often tenuous respiratory status  - Cont Venetoclax (held on admission, restarted on 10/30)  - GOC -> prelim BM 5% blasts, discussing cancer plan w/ HPOA sister Raymond [name]    Chronic Medical Problems:  HTN:??Holding amlodipine 10 mg daily  CAD:??Stents last placed on 02/2016 with reported EF of 50%. Most recent heart cath on 01/2018 was unremarkable. Holding IMDUR, diuresing as above, metoprolol tartrate 12.5 mg BID in place of home metoprolol succinate 25 mg daily  Chronic Pain: Continue home pain regimen    Daily Checklist:  Diet: Regular Diet  DVT PPx: Contradindicated - Thrombocytopenic  Electrolytes: Replete PRN  Code Status: DNR and DNI  Dispo: Admit to E1    Objective:   Temp:  [36.6 ??C-37.7 ??C] 36.6 ??C  Heart Rate:  [60-95] 64 SpO2 Pulse:  [79] 79  Resp:  [22-35] 22  BP: (119-168)/(52-139) 126/52  SpO2:  [91 %-100 %] 96 %    Gen: NAD, alert, oriented, answers questions appropriately  HEENT: atraumatic, sclera anicteric, left eyelid swollen, able to open both easily, no drainage observed  Heart: RRR  Lungs: no increased WOB on [place], CTAB  Abdomen: non-tender  Extremities: no edema  Psych: At baseline, continues to repeat when can I go home    Labs/Studies: Labs and Studies from the last 24hrs per EMR and Reviewed

## 2019-03-11 NOTE — Unmapped (Signed)
BP WNL,HR WNL but received report of A-Flutter/tele.Pt appeared tachypneic.Apparent dyspnea noted with even minor turning/repositioning.O2 at 3LPM/[place]/humidified.Refused BiPap set up(MD made aware))2 sats in mid 90s.Had 2 large BMs since arrival to the floor.Completed 3 bag Potassium replacements.Tolerated meds well.Md informed of the worsening tremors which pt described as chronic/normal.Hydrocortisone cream applied on rashes on back area.Turned/repositioned regularly.HOB elevated.Will monitor  Problem: Adult Inpatient Plan of Care  Goal: Plan of Care Review  Outcome: Ongoing - Unchanged  Goal: Patient-Specific Goal (Individualization)  Outcome: Ongoing - Unchanged  Goal: Absence of Hospital-Acquired Illness or Injury  Outcome: Ongoing - Unchanged  Goal: Optimal Comfort and Wellbeing  Outcome: Ongoing - Unchanged  Goal: Readiness for Transition of Care  Outcome: Ongoing - Unchanged  Goal: Rounds/Family Conference  Outcome: Ongoing - Unchanged     Problem: Wound  Goal: Optimal Wound Healing  Outcome: Ongoing - Unchanged     Problem: Fall Injury Risk  Goal: Absence of Fall and Fall-Related Injury  Outcome: Ongoing - Unchanged     Problem: Skin Injury Risk Increased  Goal: Skin Health and Integrity  Outcome: Ongoing - Unchanged     Problem: Respiratory Compromise (Pneumonia)  Goal: Effective Oxygenation and Ventilation  Outcome: Ongoing - Unchanged     Problem: Hypertension Comorbidity  Goal: Blood Pressure in Desired Range  Outcome: Ongoing - Unchanged     Problem: Infection  Goal: Infection Symptom Resolution  Outcome: Ongoing - Unchanged

## 2019-03-12 DIAGNOSIS — C9200 Acute myeloblastic leukemia, not having achieved remission: Secondary | ICD-10-CM | POA: Diagnosis not present

## 2019-03-12 DIAGNOSIS — I4891 Unspecified atrial fibrillation: Secondary | ICD-10-CM | POA: Insufficient documentation

## 2019-03-12 LAB — COMPREHENSIVE METABOLIC PANEL
ALKALINE PHOSPHATASE: 191 U/L — ABNORMAL HIGH (ref 38–126)
ALT (SGPT): 72 U/L — ABNORMAL HIGH (ref <50)
AST (SGOT): 65 U/L — ABNORMAL HIGH (ref 19–55)
BILIRUBIN TOTAL: 1.5 mg/dL — ABNORMAL HIGH (ref 0.0–1.2)
BLOOD UREA NITROGEN: 11 mg/dL (ref 7–21)
BUN / CREAT RATIO: 22
CALCIUM: 7.3 mg/dL — ABNORMAL LOW (ref 8.5–10.2)
CHLORIDE: 83 mmol/L — ABNORMAL LOW (ref 98–107)
CO2: >40 mmol/L (ref 22.0–30.0)
CREATININE: 0.5 mg/dL — ABNORMAL LOW (ref 0.70–1.30)
EGFR CKD-EPI AA MALE: >90 mL/min/{1.73_m2} (ref >=60)
EGFR CKD-EPI NON-AA MALE: >90 mL/min/{1.73_m2} (ref >=60)
GLUCOSE RANDOM: 87 mg/dL (ref 70–179)
POTASSIUM: 3.5 mmol/L (ref 3.5–5.0)
PROTEIN TOTAL: 5.4 g/dL — ABNORMAL LOW (ref 6.5–8.3)
SODIUM: 124 mmol/L — ABNORMAL LOW (ref 135–145)

## 2019-03-12 LAB — CBC W/ AUTO DIFF
BASOPHILS RELATIVE PERCENT: 1.1 %
EOSINOPHILS ABSOLUTE COUNT: 0 10*9/L (ref 0.0–0.4)
EOSINOPHILS RELATIVE PERCENT: 0.9 %
HEMATOCRIT: 25.3 % — ABNORMAL LOW (ref 41.0–53.0)
HEMOGLOBIN: 8.1 g/dL — ABNORMAL LOW (ref 13.5–17.5)
LARGE UNSTAINED CELLS: 12 % — ABNORMAL HIGH (ref 0–4)
LYMPHOCYTES ABSOLUTE COUNT: 0.3 10*9/L — ABNORMAL LOW (ref 1.5–5.0)
LYMPHOCYTES RELATIVE PERCENT: 37.6 %
MEAN CORPUSCULAR HEMOGLOBIN CONC: 32 g/dL (ref 31.0–37.0)
MEAN CORPUSCULAR HEMOGLOBIN: 30.2 pg (ref 26.0–34.0)
MEAN CORPUSCULAR VOLUME: 94.2 fL (ref 80.0–100.0)
MEAN PLATELET VOLUME: 9.3 fL (ref 7.0–10.0)
MONOCYTES ABSOLUTE COUNT: 0.1 10*9/L — ABNORMAL LOW (ref 0.2–0.8)
MONOCYTES RELATIVE PERCENT: 13.7 %
NEUTROPHILS ABSOLUTE COUNT: 0.3 10*9/L — CL (ref 2.0–7.5)
NEUTROPHILS RELATIVE PERCENT: 34.8 %
PLATELET COUNT: 235 10*9/L (ref 150–440)
RED BLOOD CELL COUNT: 2.69 10*12/L — ABNORMAL LOW (ref 4.50–5.90)
RED CELL DISTRIBUTION WIDTH: 17.4 % — ABNORMAL HIGH (ref 12.0–15.0)
WBC ADJUSTED: 0.9 10*9/L — ABNORMAL LOW (ref 4.5–11.0)

## 2019-03-12 LAB — BILIRUBIN TOTAL: Bilirubin:MCnc:Pt:Ser/Plas:Qn:: 1.5 — ABNORMAL HIGH

## 2019-03-12 LAB — MAGNESIUM: Magnesium:MCnc:Pt:Ser/Plas:Qn:: 1.8

## 2019-03-12 LAB — MEAN CORPUSCULAR HEMOGLOBIN CONC: Lab: 32

## 2019-03-12 LAB — ASPR FUMIGAT IGG: Aspergillus fumigatus Ab.IgG:MCnc:Pt:Ser:Qn:: 34.6

## 2019-03-12 LAB — PHOSPHORUS: Phosphate:MCnc:Pt:Ser/Plas:Qn:: 1.8 — ABNORMAL LOW

## 2019-03-12 MED ORDER — PIPERACILLIN-TAZOBACTAM 4.5 G IVPB
4.50 | INTRAVENOUS | Status: DC
Start: 2019-03-12 — End: 2019-03-12

## 2019-03-12 MED ORDER — METOPROLOL TARTRATE 25 MG PO TABS
12.50 | ORAL_TABLET | ORAL | Status: DC
Start: 2019-03-12 — End: 2019-03-12

## 2019-03-12 MED ORDER — HYDROXYZINE HCL 25 MG PO TABS
25.00 | ORAL_TABLET | ORAL | Status: DC
Start: ? — End: 2019-03-12

## 2019-03-12 MED ORDER — LIDOCAINE 5 % EX PTCH
1.00 | MEDICATED_PATCH | CUTANEOUS | Status: DC
Start: 2019-03-12 — End: 2019-03-12

## 2019-03-12 MED ORDER — GENERIC EXTERNAL MEDICATION
Status: DC
Start: ? — End: 2019-03-12

## 2019-03-12 MED ORDER — ACETAMINOPHEN 325 MG PO TABS
650.00 | ORAL_TABLET | ORAL | Status: DC
Start: ? — End: 2019-03-12

## 2019-03-12 MED ORDER — SODIUM CHLORIDE 0.9 % IV SOLN
INTRAVENOUS | Status: DC
Start: ? — End: 2019-03-12

## 2019-03-12 MED ORDER — METOPROLOL SUCCINATE ER 25 MG TABLET,EXTENDED RELEASE 24 HR
ORAL_TABLET | Freq: Every day | ORAL | 2 refills | 90.00000 days | Status: CP
Start: 2019-03-12 — End: 2019-04-11

## 2019-03-12 MED ORDER — AMOXICILLIN 875 MG-POTASSIUM CLAVULANATE 125 MG TABLET
ORAL_TABLET | Freq: Two times a day (BID) | ORAL | 0 refills | 14 days | Status: CP
Start: 2019-03-12 — End: 2019-03-26
  Filled 2019-03-12: qty 28, 14d supply, fill #0

## 2019-03-12 MED ORDER — CITALOPRAM 20 MG TABLET
ORAL_TABLET | Freq: Every day | ORAL | 2 refills | 90.00000 days | Status: CP
Start: 2019-03-12 — End: 2019-04-11

## 2019-03-12 MED ORDER — ONDANSETRON HCL 4 MG TABLET
ORAL_TABLET | Freq: Three times a day (TID) | ORAL | 0 refills | 30.00000 days | Status: CP | PRN
Start: 2019-03-12 — End: ?
  Filled 2019-03-12: qty 90, 30d supply, fill #0

## 2019-03-12 MED FILL — AMOXICILLIN 875 MG-POTASSIUM CLAVULANATE 125 MG TABLET: 14 days supply | Qty: 28 | Fill #0 | Status: AC

## 2019-03-12 MED FILL — ONDANSETRON HCL 4 MG TABLET: 30 days supply | Qty: 90 | Fill #0 | Status: AC

## 2019-03-12 NOTE — Unmapped (Signed)
Physician Discharge Summary [facility]  4 ONC [facility]  [address]  [place] [address]  Dept: [phone]  Loc: [phone]     Identifying Information:   Jahmal Dunavant  06-23-1955  [id number]    Primary Care Physician: Donnald Garre, MD     Referring Physician: Windell Norfolk Zeidner     Code Status: DNR and DNI    Admit Date: 03/03/2019    Discharge Date: 03/12/2019     Discharge To: Home Hospice    Discharge Service: [facility] - MDE - Hematology Teaching     Discharge Attending Physician: Ruthell Rummage, MD    Discharge Diagnoses:  Principal Problem:    Acute hypoxemic respiratory failure (CMS-HCC)  Active Problems:    Diabetes mellitus (CMS-HCC)    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 *      Outpatient Provider Follow Up Issues:   Follow-up Plan after discharge:  1. Issues related to hospitalization: Acute on chronic hypoxic respiratory failure, hyponatremia    Patient's primary oncologist and/or nurse navigator: Dr. Jari Sportsman handoff via Epic message or direct conversation?: Yes.    Hospital Course:   Jony Ladnier Pua??is a 63 y.o.??man with a history of COPD and recently diagnosed AML with course complicated by treatment related pancytopenia and GI bleed who presented as a transfer from [facility] for management of AHRF and elevated LFTs, found to also have neutropenic fevers on arrival. Acute on Chronic Hypoxic Respiratory Failure - PNA - Volume Overload:  AHRF likely multifactorial from multifocal pneumonia, COPD, and volume overload.  Per sister presented with 1 day of dyspnea, stopped wearing his home O2, and no pulse ox at home. At baseline on 2L of supplemental oxygen at home. Found to be hypoxic at OSH requiring HFNC.??CXR??with evidence of pulmonary edema and trace left pleural effusion.??Evening of transfer developed fever to 38.C raising concern for PNA. History of COPD having recently completed an extended steroid taper for exacerbation on prior admission and has poor airflow on exam. Also with 3+ pitting edema of the lower extremities suggesting component of volume overload and pro-BNP of 4240 compared to 88-100 on prior labs. Echo this admission showed newly reduced EF to Respiratory Pathogen Panel, SARS-CoV-2 PCR negative. CT Chest Noncon (10/26) w/ multifocal nodular and consolidative opacities, compatible with bronchopneumonia of possible viral etiology. Had intermittent returns to home O2 requirements during admission with diuresis, antibiotics, prednisone and nebulizer treatments, however followed by sudden increase in O2 requirements with periodic prn bipap use and bipap use at night in setting of chronic respiratory acidosis. Fluid restriction started due to excess PO fluid intake to maintain goal of net -1L. Again febrile (10/31), with repeat CT chest w/o contrast notable for interval improvement of patchy bilateral peribronchial nodular and GGO. Weaned to 3l [place] (11/1). Treated for HAP with cefepime 10/26 - 10/29 followed by zosyn 10/29 - 11/4). Initially planned for 1 week course to complete on 11/2 but extended due to fevers as below. It was decided to discharge patient on augmentin, while on hospice. Neutropenic Fevers - Most likely due to PNA initially, fevered to 39.4 on 10/26 then slowly defervesced throughout day. Treated for HAP as above with cefepime followed by zosyn. Blood cultures from OSH were NG x 5 days and repeat cultures at [facility] were NG on two separate occasions (10/26, 10/31).  and had largely been afebrile until  evening 10/30 he began to have recurrent evening fevers daily in 38 -38.5 C range with other vitals stable.  UA unremarkable, urine culture 10/31 without growth, repeat blood cultures without growth. Zosyn course was extended and transitioned to augmentin. Considered intraabdominal pathology given pt's history of recurrent diarrhea with alternating constipation. Bowel regimen held on 11/1 and C diff test performed on 11/3 is still pending.      Afib w/ RVR - Pt with afib on 10/29 with rates as high as 170s-180s. No previous documentation of afib, was on metoprolol at home but may have been for CAD rather than for afib. Received 2 doses metoprolol 5 mg and 1 dose IV lasix 40 and converted to NSR within about an hour. Suspect afib was instigated by volume overload. Pt was restarted on his home metoprolol in divided doses (metoprolol tartrate 12.5 mg BID in place of home metoprolol succinate 25 mg daily). Consolidated back to home regimen on 11/3.  ??   Upper extremity tremor - S/p stroke w/u for L facial droop Patient developed acute-onset left-sided facial droop and LUE weakness on day of admission 10/27. Evaluated by Neurology NIHSS 8 initially, CTH negative. 24 hours of video EEG completed and without focal seziures, only showed difuse slowing. Neurology signed off. Droop largely resolved.. Mental status seems at baseline (hx of intellectual disability). However has had pronounced upper extremity tremors new on this admission Elevated LFTs:??Characterized by mild hyperbilirubinemia and transaminase elevations.TBili first noted to be elevated on 10/20, AST/ALT/AlkPhos elevation since 10/21.??Likely drug induced. Could be related to azacitidine which can cause transient elevation of liver enzymes. Also discharged on amicare which may have exacerbated transaminitis. Hep B/C and HIV were negative in 11/2017. Most recent abdominal imaging (CT) showed hepatosplenomegaly but no evidence of cirrhosis. RUQ [place] without biliary duct dilation, showed mild hepatomegaly, s/p cholecystectomy.   ??  Ileus: Dilated loops of bowel on early admission KUB concerning for partial SBO vs ileus after transient report of abdominal pain. Pt with history of going through cycles of constipation followed by diarrhea. Sister reports he did have one dark (chronic melena) BM on 10/25 prior to presentation to hospital. Often in house has frequent BMs but with straining so we continued aggressive bowel regimen with miralax TID and senna BID. Held bowel regimen on 11/1 for diarrhea with plan to test for C diff on 11/3 is still pending.  Repeat KUB on 11/2 was without dilated bowels or large stool burden.  ??  Diffuse Rash - Facial edema (resolving)- suspect mild drug allergy to vanc vs. amicar. Swelling and itching improved with topical steroid cream, prednisone for COPD exacerbation, and 1x benadryl. Rash still present with diffuse erythematous macules on trunk but improving. Circumscribed lesions on back likely psoriatic given hx of psoriasis per chart review.    L eye swelling/drainage (Stable)- Purulent drainage on admission, was treated with polymixin eye drops for bacterial conjunctivitis for 1 week course from 10/26 - 11/2 with improvement. Adverse Risk AML: Follows with Dr. Vertell Limber. BMBx on 01/15/2019 was hypercellular (70%) with 30% blasts. He has complex cytogenetics and TP53 mutation. He was started on Azacitdine/Venetoclax, C1D1 = 10/6. Course complicated by pancytopenia and GI bleeding. He has a poor prognosis with and without chemo and has many underlying comorbidities. Began to discuss pt's goals of care with pt and sister- pt with poor understanding of risks vs. Benefits of chemo and only short term outlook I want to go home. Extensive goals of care discussion was had with  pt's three sister's on 10/31. Decision was made to proceed with bone marrow biopsy on 11/2 to assess for response to therapy. Prelim bone marrow biopsy results showed response with 4% blasts. After discussion with HPOA [name] decision made to transition to home hospice.    Hyponatremia: New on this admission with drop from 136 to 126 between 10/27 to 10/28. Initially suspected to be hypervolemic given evidence of volume overload potentially also with component from low solute intake and/or psychogenic polydypsia given large volume PO water intake the day of the acute drop and intermittently thereafter w/ very poor PO food intake. Urine studies with Uosmol 479 and urine sodium < 5 (however urine studies obtained after already being diuresed) potentially consistent with hypervolemia.  Pt eventually placed on a 1.5 L fluid restriction and continued to be diuresed but sodium did not improve. Stable upon discharge to home hospice.  ??  Chronic Medical Problems:  HTN:??Holding amlodipine 10 mg daily  CAD:??Stents last placed on 02/2016 with reported EF of 50%. Most recent heart cath on 01/2018 was unremarkable. Holding IMDUR, diuresing as above, home metoprolol held then resumed  Chronic Pain: Home tramadol held given initial concerns for AMS, resumed at discharge.  ??      Procedures:  None  No admission procedures for hospital encounter. ______________________________________________________________________  Discharge Medications:     Your Medication List      STOP taking these medications    acetaminophen 650 MG CR tablet  Commonly known as: TYLENOL 8 HOUR     aminocaproic acid 500 mg tablet  Commonly known as: AMICAR     amLODIPine 5 MG tablet  Commonly known as: NORVASC     dicyclomine 10 mg capsule  Commonly known as: BENTYL     DOK 100 MG capsule  Generic drug: docusate sodium     furosemide 40 MG tablet  Commonly known as: LASIX     isosorbide mononitrate 30 MG 24 hr tablet  Commonly known as: IMDUR     levoFLOXacin 500 MG tablet  Commonly known as: LEVAQUIN     loperamide 2 mg tablet  Commonly known as: IMODIUM A-D     nystatin 100,000 unit/mL suspension  Commonly known as: MYCOSTATIN     posaconazole 100 mg Tbec delayed released tablet  Commonly known as: NOXAFIL     SENNA LAX 8.6 mg tablet  Generic drug: senna     valACYclovir 500 MG tablet  Commonly known as: VALTREX     venetoclax 100 mg tablet  Commonly known as: VENCLEXTA        START taking these medications    amoxicillin-clavulanate 875-125 mg per tablet  Commonly known as: AUGMENTIN  Take 1 tablet by mouth Two (2) times a day for 14 days.        CONTINUE taking these medications    albuterol 90 mcg/actuation inhaler  Commonly known as: PROVENTIL HFA;VENTOLIN HFA  Inhale 2 puffs every four (4) hours as needed.     citalopram 20 MG tablet  Commonly known as: CeleXA  Take 1 tablet (20 mg total) by mouth daily.     diclofenac sodium 1 % gel  Commonly known as: VOLTAREN  Apply 2 g topically.     FLECTOR 1.3 % Pt12  Generic drug: diclofenac epolamine  Place 1 patch on the skin daily.     metoprolol succinate 25 MG 24 hr tablet  Commonly known as: TOPROL-XL  Take 1 tablet (25 mg total) by mouth daily.  nitroglycerin 0.4 MG SL tablet  Commonly known as: NITROSTAT  Place 1 tablet (0.4 mg total) under the tongue every five (5) minutes as needed for chest pain.     ondansetron 4 MG tablet Commonly known as: ZOFRAN  Take 1 tablet (4 mg total) by mouth every eight (8) hours as needed for nausea for up to 30 doses.     pantoprazole 40 MG tablet  Commonly known as: PROTONIX  Take 1 tablet (40 mg total) by mouth Two (2) times a day.     SYMBICORT 160-4.5 mcg/actuation inhaler  Generic drug: budesonide-formoteroL  INHALE TWO PUFFS BY MOUTH TWICE DAILY     traMADoL 50 mg tablet  Commonly known as: ULTRAM  Take 1 tablet (50 mg total) by mouth every eight (8) hours as needed for pain.            Allergies:  Patient has no known allergies.  ______________________________________________________________________  Pending Test Results (if blank, then none):  Pending Labs     Order Current Status    Aspergillus Antibodies, IgG In process    Coccidioides Antibodies In process    Cytogenetics AP Order In process    FLT3(ITD and FLT3(TKD)DNA Panel Assay In process    Fungal Antibodies In process    Histoplasma Antibody In process    Myeloid Mutation Panel - AML In process    Blood Culture #1 Preliminary result    Blood Culture, Adult Preliminary result    Blood Culture, Adult Preliminary result          Most Recent Labs:  All lab results last 24 hours -   Recent Results (from the past 24 hour(s))   Comprehensive Metabolic Panel    Collection Time: 03/12/19  4:35 AM   Result Value Ref Range    Sodium 124 (L) 135 - 145 mmol/L    Potassium 3.5 3.5 - 5.0 mmol/L    Chloride 83 (L) 98 - 107 mmol/L    Anion Gap      CO2 >40.0 (HH) 22.0 - 30.0 mmol/L    BUN 11 7 - 21 mg/dL    Creatinine 5.78 (L) 0.70 - 1.30 mg/dL    BUN/Creatinine Ratio 22     EGFR CKD-EPI Non-African American, Male >90 >=60 mL/min/1.51m2    EGFR CKD-EPI African American, Male >90 >=60 mL/min/1.16m2    Glucose 87 70 - 179 mg/dL    Calcium 7.3 (L) 8.5 - 10.2 mg/dL    Albumin 2.4 (L) 3.5 - 5.0 g/dL    Total Protein 5.4 (L) 6.5 - 8.3 g/dL    Total Bilirubin 1.5 (H) 0.0 - 1.2 mg/dL    AST 65 (H) 19 - 55 U/L    ALT 72 (H) <50 U/L Alkaline Phosphatase 191 (H) 38 - 126 U/L   Magnesium Level    Collection Time: 03/12/19  4:35 AM   Result Value Ref Range    Magnesium 1.8 1.6 - 2.2 mg/dL   Phosphorus Level    Collection Time: 03/12/19  4:35 AM   Result Value Ref Range    Phosphorus 1.8 (L) 2.9 - 4.7 mg/dL   CBC w/ Differential    Collection Time: 03/12/19  4:35 AM   Result Value Ref Range    WBC 0.9 (L) 4.5 - 11.0 10*9/L    RBC 2.69 (L) 4.50 - 5.90 10*12/L    HGB 8.1 (L) 13.5 - 17.5 g/dL    HCT 46.9 (L) 62.9 - 53.0 %    MCV  94.2 80.0 - 100.0 fL    MCH 30.2 26.0 - 34.0 pg    MCHC 32.0 31.0 - 37.0 g/dL    RDW 59.5 (H) 63.8 - 15.0 %    MPV 9.3 7.0 - 10.0 fL    Platelet 235 150 - 440 10*9/L    Neutrophils % 34.8 %    Lymphocytes % 37.6 %    Monocytes % 13.7 %    Eosinophils % 0.9 %    Basophils % 1.1 %    Absolute Neutrophils 0.3 (LL) 2.0 - 7.5 10*9/L    Absolute Lymphocytes 0.3 (L) 1.5 - 5.0 10*9/L    Absolute Monocytes 0.1 (L) 0.2 - 0.8 10*9/L    Absolute Eosinophils 0.0 0.0 - 0.4 10*9/L    Absolute Basophils 0.0 0.0 - 0.1 10*9/L    Large Unstained Cells 12 (H) 0 - 4 %    Macrocytosis Slight (A) Not Present    Anisocytosis Slight (A) Not Present    Hypochromasia Slight (A) Not Present   ECG 12 Lead    Collection Time: 03/12/19  7:18 AM   Result Value Ref Range    EKG Systolic BP  mmHg    EKG Diastolic BP  mmHg    EKG Ventricular Rate 180 BPM    EKG Atrial Rate 202 BPM    EKG P-R Interval  ms    EKG QRS Duration 90 ms    EKG Q-T Interval 246 ms    EKG QTC Calculation 425 ms    EKG Calculated P Axis  degrees    EKG Calculated R Axis 79 degrees    EKG Calculated T Axis -128 degrees    QTC Fredericia 355 ms       Relevant Studies/Radiology (if blank, then none):  Ecg 12 Lead    Result Date: 03/12/2019  ATRIAL FIBRILLATION WITH RAPID VENTRICULAR RESPONSE ST & T WAVE ABNORMALITY, CONSIDER INFERIOR ISCHEMIA ABNORMAL ECG WHEN COMPARED WITH ECG OF 06-Mar-2019 18:20, NONSPECIFIC T WAVE ABNORMALITY NOW EVIDENT IN LATERAL LEADS    Ecg 12 Lead Result Date: 03/09/2019  ATRIAL FIBRILLATION WITH RAPID VENTRICULAR RESPONSE BASELINE ARTIFACT BASELINE WANDER NON-SPECIFIC ST/T WAVE CHANGES ABNORMAL ECG WHEN COMPARED WITH ECG OF 06-Mar-2019 16:16, ST NO LONGER DEPRESSED IN INFERIOR LEADS ST LESS DEPRESSED IN LATERAL LEADS Confirmed by Christella Noa ([id number]) on 03/09/2019 9:55:27 AM    Ecg 12 Lead    Result Date: 03/08/2019  ATRIAL FIBRILLATION WITH RAPID VENTRICULAR RESPONSE ST-SEGMENT DEPRESSION CONSISTENT WITH SUBENDOCARDIAL ISCHEMIA ABNORMAL ECG WHEN COMPARED WITH ECG OF 14-Feb-2019 01:30, ATRIAL FIBRILLATION HAS REPLACED SINUS RHYTHM ST NOW DEPRESSED IN INFERIOR LEADS ST NOW DEPRESSED IN ANTEROLATERAL LEADS T WAVE INVERSION NOW EVIDENT IN INFERIOR LEADS NONSPECIFIC T WAVE ABNORMALITY NOW EVIDENT IN LATERAL LEADS Confirmed by Pollyann Kennedy ([id number]) on 03/08/2019 1:42:45 PM    Eeg Video Monitoring    Result Date: 03/06/2019 HISTORY Hensley Arihaan Bellucci is 63 y.o. years old male with evaluation of seizure or seizure like activity. PROCDURE Continuous vide-EEG was performed while awake utilizing 21 active electrodes placed according to the international 10-20 system.  The study was recorded digitally with a bandpass of 1-70Hz  and a sampling rate of 200Hz  and was reviewed with the possibility of multiple reformatting. The study was digitally processed with potential spike and seizure events identified for physician analysis and review.  Patient recognized events were identified by a push button marker and reviewed by the physician.  Background activity was reviewed from the continuously recorded data. Simultaneous video  was reviewed for all patient events. TECHNICAL DESCRIPTION DETAIL: Day 1 (03:22 on 03/04/2019 - 06:17 on 03/05/2019) The best waking record shows a good , fair  organization at rest, consisting of a 20-40 uV 8-9 Hz posterior dominant rhythm with fair  reactivity. There is a moderate bilateral beta activity. There are intermittent bilateral theta > occasional delta slow waves. Excessive to nearly continuous drowsiness is characterized by attenuation of the background, vertex waves, and bilateral theta and delta slowing. Stage II sleep is characterized by slowing, vertex waves, K-complexes, and symmetric sleep spindles. Slow wave and REM sleep are recorded. There is no push button clinical event. Day 2 (06:17 on 03/05/2019 - 09:14 on 03/06/2019) The record shows similar findings to the previous day's recording. There is no push button clinical event. IMPRESSION This continuous EEG with video monitoring over 2.5 days  is abnormal due to mild background slowing CLINICAL INTERPRETATION This study is consistent with mild bilateral cerebral dysfunction and encephalopathy.     Xr Chest 1 View    Result Date: 03/08/2019 EXAM: XR CHEST 1 VIEW DATE: 03/08/2019 3:15 AM ACCESSION: [id number] UN DICTATED: 03/08/2019 4:29 AM [facility]PRETATION LOCATION: [facility] CLINICAL INDICATION: 63 years old Male with HYPOXEMIA  COMPARISON: Chest radiograph 03/06/2019, CT chest 03/05/2019 TECHNIQUE: Portable Chest Radiograph. FINDINGS: Mild to moderate bilateral pulmonary disease, similar prior. No pleural effusion or pneumothorax. Stable cardiomediastinal silhouette.     -Findings consistent with multifocal infection as seen on prior CT.    Xr Chest 1 View    Result Date: 03/06/2019  EXAM: XR CHEST 1 VIEW DATE: 03/06/2019 6:28 PM ACCESSION: [id number] UN DICTATED: 03/06/2019 6:40 PM INTERPRETATION LOCATION: [facility] CLINICAL INDICATION: 63 years old Male with afib ; OTHER  COMPARISON: Chest CT dated 03/05/2019, chest radiograph dated 03/03/2019 TECHNIQUE: Portable Chest Radiograph. FINDINGS:                      Unchanged perihilar interstitial and alveolar opacities with bibasilar predominance, findings correlate with nodular consolidative opacities seen on CT scan.    Small pleural effusion in the right lung base. The left lung base is excluded from the field-of-view. Unchanged cardiomediastinal silhouette.     Unchanged bibasilar opacities which correlate with CT findings, most likely representing viral bronchopneumonia.    Ct Chest Wo Contrast    Result Date: 03/08/2019 EXAM: CT CHEST WO CONTRAST DATE: 03/08/2019 1:54 PM ACCESSION: [id number] UN DICTATED: 03/08/2019 2:26 PM INTERPRETATION LOCATION: [facility] CLINICAL INDICATION: 63 years old Male with multifocal PNA in neutropenic pt, ? bacterial vs. fungal  COMPARISON: CT chest 03/05/2019 TECHNIQUE: A helical CT scan was obtained without IV contrast from the thoracic inlet through the hemidiaphragms. Images were reconstructed in the axial plane.  Coronal and sagittal reformatted images of the chest were also provided for further evaluation of the lung parenchyma. FINDINGS: AIRWAYS, LUNGS, PLEURA: Clear central tracheobronchial tree. Interval improvement of patchy bilateral peribronchial nodular and groundglass opacities compared to prior CT. Interval resolution of smooth interlobular septal thickening. Small bilateral pleural effusions, unchanged. MEDIASTINUM: Normal heart size. Trace pericardial effusion. Coronary atherosclerosis. Normal caliber thoracic aorta.  Unchanged prominent mediastinal lymph nodes, likely reactive. IMAGED ABDOMEN: Sequela of cholecystectomy. Unchanged bilateral perinephric stranding. Partially imaged trace ascites. SOFT TISSUES: Bilateral gynecomastia BONES: Multilevel degenerative changes of the spine. No suspicious osseous lesions.     Interval improvement of patchy bilateral peribronchial nodular and groundglass opacities compared to prior CT, compatible with improving bronchopneumonia likely due to atypical organisms including viral and fungal etiology.  Interval resolution of interstitial pulmonary edema. Stable small bilateral pleural effusions.    Xr Abdomen 1 View    Result Date: 03/10/2019 EXAM: XR ABDOMEN 1 VIEW DATE: 03/10/2019 10:15 AM ACCESSION: [id number] UN DICTATED: 03/10/2019 10:17 AM INTERPRETATION LOCATION: [facility] CLINICAL INDICATION: 63 years old Male with CONSTIPATION  COMPARISON: CT abdomen/pelvis 02/14/2019 TECHNIQUE: Supine view of the abdomen. FINDINGS: Nonobstructive bowel gas pattern. No significant stool burden. Punctate calcifications in the expected location of the prostate. Right upper quadrant surgical clips. Right lower quadrant metallic densities, likely sequelae of remote appendectomy. No acute osseous abnormality. Visualized lung bases and cardiomediastinal silhouette are unremarkable.     Nonobstructive bowel gas pattern. No significant stool burden.    ______________________________________________________________________  Discharge Instructions:             Other Instructions     Discharge instructions      It was a pleasure taking care of you!     You will be transitioning to hospice at home, which is basically a team of doctors and nurses who are available to you to help maximize your quality of life. This looks different for every patient but typically includes management of your symptoms including: nausea, vomiting, constipation, pain, anxiety, shortness of breath, fatigue, poor appetite, or anything else that you feel is impacting your quality of life. Each hospice agency is different, but typically agencies have nurses visit your house several times a week and have a phone number you can call should you or your caretakers have any questions between visits.     During this COVID-19 outbreak, please avoid and or minimize close contact with members outside of your immediate family. If you must be in public wear a mask and remain 6 feet from others.  Avoid large crowds or situations with close contact of multiple people. REMEMBER to wash your hands frequently and increase this frequency when in public. You will be sent home with a few medications to help bridge the time between leaving the hospital and when you meet your hospice agency. These medications include:   -Albuterol  -Citalopram  -Diclofenac  -Flector  -Metoprolol succinate  -Nitroglycerin  -Tramadol  -Zofran  -Pantoprazole     Your hospice agency in many ways will now function as your primary care provider. Your nurse will be able to give medications and consult with his/her supervising provider should more meds/resources be needed. The hospice provider is either your primary care provider/oncologist or a provider who works for the agency. ??They will be your primary point of contact for all medical care moving forward.     Of course, your cancer team at [facility] is still available as needed for any questions that may arise. Our contact information is below:     For questions/urgent concerns Monday through Friday 8AM-5PM, please first contact your hospice team. If you need to speak with someone at [facility], please contact your nurse navigator:   Wynona Meals     For appointments Monday through Friday 8 AM- 5 PM   please call [phone] or Toll free [phone].     On Nights, Weekends and Holidays   Call [phone] and ask for the oncologist on call.     [facility]   [address]   [place], [place] [address]   [email or web address]               Follow Up instructions and Outpatient Referrals     Discharge instructions      Referral to  hospice      Facility Type: Home-based    Did you call Hospice before placing this order?: Yes            ______________________________________________________________________  Discharge Day Services:  BP 108/83  - Pulse 148  - Temp 36.4 ??C  - Resp 22  - Ht 170.2 cm (5' 7)  - Wt 95.4 kg (210 lb 4.8 oz)  - SpO2 93% Comment: 4L - BMI 32.94 kg/m??   Pt seen on the day of discharge and determined appropriate for discharge.    Condition at Discharge: stable Length of Discharge: I spent greater than 30 mins in the discharge of this patient.

## 2019-03-12 NOTE — Unmapped (Signed)
Discharged instructions, medication changes and follow-up appointments were reviewed with Patient Raymond [name] and sister.  Sister verbalized understanding.  PIV's removed tip intact, patient tolerated it well.  Foley removed, patient tolerated it well.  Patient left with all belongings via [facility] at [phone].        Problem: Adult Inpatient Plan of Care  Goal: Plan of Care Review  Outcome: Discharged to Home  Goal: Patient-Specific Goal (Individualization)  Outcome: Discharged to Home  Goal: Absence of Hospital-Acquired Illness or Injury  Outcome: Discharged to Home  Goal: Optimal Comfort and Wellbeing  Outcome: Discharged to Home  Goal: Readiness for Transition of Care  Outcome: Discharged to Home  Goal: Rounds/Family Conference  Outcome: Discharged to Home

## 2019-03-12 NOTE — Unmapped (Signed)
Pt remained stable during shift, afebrile, with VSS.  No complaints of nausea during shift.  Pt had complaint of pain.  PRN pain medication given with good relief.  Pt had multipel loose stools this AM.  CDIFF rule/out ordered.  Since order was placed, Pt has not had a bowel movement.  Pt able to turn on his own.  Pt voicing that he would really like to go home.  Medical team made aware.

## 2019-03-12 NOTE — Unmapped (Addendum)
Pt A&Ox4.  VSS, afebrile.  2 calls from telemetry indicating pt was desating into 80s, both cases pt [place] was not positioned properly.  When [place] readjusted, pt O2 sat returned to mid 90s.  Pt c/o severe itching.  Provided atarax 2x and eucerin creme applied 2x w/ some relief.   Pt received a bath.  Pt is w/o BM so stool sample for CDiff r/o not collected.  Foley in place w/ good outpt. Pt reminded to turn Q2, avoids right side.  No falls or acute events during shift.  WCTM.    Problem: Adult Inpatient Plan of Care  Goal: Plan of Care Review  03/12/2019 0511 by Heriberto Antigua, RN  Outcome: Progressing  03/12/2019 0511 by Heriberto Antigua, RN  Outcome: Progressing  Goal: Patient-Specific Goal (Individualization)  03/12/2019 0511 by Heriberto Antigua, RN  Outcome: Progressing  03/12/2019 0511 by Heriberto Antigua, RN  Outcome: Progressing  Goal: Absence of Hospital-Acquired Illness or Injury  03/12/2019 0511 by Heriberto Antigua, RN  Outcome: Progressing  03/12/2019 0511 by Heriberto Antigua, RN  Outcome: Progressing  Goal: Optimal Comfort and Wellbeing  03/12/2019 0511 by Heriberto Antigua, RN  Outcome: Progressing  03/12/2019 0511 by Heriberto Antigua, RN  Outcome: Progressing  Goal: Readiness for Transition of Care  03/12/2019 0511 by Heriberto Antigua, RN  Outcome: Progressing  03/12/2019 0511 by Heriberto Antigua, RN  Outcome: Progressing  Goal: Rounds/Family Conference  03/12/2019 0511 by Heriberto Antigua, RN  Outcome: Progressing  03/12/2019 0511 by Heriberto Antigua, RN  Outcome: Progressing     Problem: Wound  Goal: Optimal Wound Healing  03/12/2019 0511 by Heriberto Antigua, RN  Outcome: Progressing  03/12/2019 0511 by Heriberto Antigua, RN  Outcome: Progressing     Problem: Fall Injury Risk  Goal: Absence of Fall and Fall-Related Injury  03/12/2019 0511 by Heriberto Antigua, RN  Outcome: Progressing  03/12/2019 0511 by Heriberto Antigua, RN  Outcome: Progressing     Problem: Skin Injury Risk Increased Goal: Skin Health and Integrity  03/12/2019 0511 by Heriberto Antigua, RN  Outcome: Progressing  03/12/2019 0511 by Heriberto Antigua, RN  Outcome: Progressing     Problem: Respiratory Compromise (Pneumonia)  Goal: Effective Oxygenation and Ventilation  03/12/2019 0511 by Heriberto Antigua, RN  Outcome: Progressing  03/12/2019 0511 by Heriberto Antigua, RN  Outcome: Progressing     Problem: Hypertension Comorbidity  Goal: Blood Pressure in Desired Range  03/12/2019 0511 by Heriberto Antigua, RN  Outcome: Progressing  03/12/2019 0511 by Heriberto Antigua, RN  Outcome: Progressing     Problem: Self-Care Deficit  Goal: Improved Ability to Complete Activities of Daily Living  03/12/2019 0511 by Heriberto Antigua, RN  Outcome: Progressing  03/12/2019 0511 by Heriberto Antigua, RN  Outcome: Progressing     Problem: Infection  Goal: Infection Symptom Resolution  Outcome: Progressing     Problem: Noninvasive Ventilation Acute  Goal: Effective Unassisted Ventilation and Oxygenation  Outcome: Progressing

## 2019-03-12 NOTE — Unmapped (Signed)
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 *    Raymond [name]??is a 63 y.o.??man with a history of COPD and recently diagnosed AML with course complicated by treatment related pancytopenia and GI bleed who presents as a transfer from [facility] for management of AHRF and elevated LFTs. Fevers and rash resolving. Increased O2 requirements c/f PNA/volume overload/COPD exacerbation also improving with diuresis, antibiotics and prednisone. Acute on Chronic Hypoxic Respiratory Failure - PNA - Volume Overload:  AHRF likely multifactorial from multifocal pneumonia, COPD, and volume overload.  Per sister, pt presented with 1 day of dyspnea, stopped wearing his home O2, and no pulse ox at home. On 2L supplemental oxygen at b/l. Hypoxic at OSH requiring HFNC.??CXR??with evidence of pulmonary edema and trace left pleural effusion.??Evening of transfer developed fever to 38.C raising concern for PNA. History of COPD having recently completed an extended steroid taper for exacerbation on prior admission and has poor airflow on exam. Also with 3+ pitting edema of the lower extremities suggesting component of volume overload and pro-BNP of 4240 compared to 88-100 on prior labs. Respiratory Pathogen Panel, SARS-CoV-2 PCR negative. CT Chest Noncon (10/26) w/ multifocal nodular and consolidative opacities, compatible with bronchopneumonia of possible viral etiology. Had intermittent returns to home O2 requirements during admission with diuresis, antibiotics, prednisone and nebulizer treatments, however followed by sudden increase in O2 requirements. Fluid restriction started due to excess PO fluid intake to maintain goal of net -1L. Again febrile (10/31), with repeat CT chest w/o contrast notable for interval improvement of patchy bilateral peribronchial nodular and GGO. Weaned to 3l  (11/1).  - Continue treatment for CAP (cefepime 10/26-10/29); Zosyn (10/29-11/4), will hold atypical cvg given negative RPP  - Augmentin (11/3-11/4), will discharge on augmentin  - Prednisone 40 mg for COPD exacerbation (10/26 - 30)  - Hold Duonebs to see if contributing to upper extremity tremor  - Close monitoring for recurrence of fever  - Nighttime BiPAP and prn daily for naps and O2 support when needed, wean as tolerated  - Cont Zosyn for 7 day course from start of antibiotics at OSH  - 40mg  PO Lasix daily - Monitor fever curve, potentially to extend zosyn end date if cont'd fevers  - CT A/P if not improving  ??  Hyponatremia:   - UOsm 384, Una 66, likely 2/2 volume overload  - Will continue diuresis and fluid restriction  ??  Fevers (resolving) - Most likely due to PNA, fevered to 39.4 on 10/26 then slowly defervesced throughout day and had largely been afebrile until evening 10/30 with 1 temp of 38.3 UA unremarkable, Blood cultures drawn and cultures from OSH NG x 24 hours after calling micro to confirm  -Cefepime (10/26 - 10/29)  -Continue Zosyn (extended dosing) (10/29)  -Repeat CT chest as above  -F/u OSH BC from 10/24 (NG x 4 days) on 10/29 and in house BCx  NGTD 10/29  -Blood Cx NGTD x2  ??  Afib w/ RVR (resolved) - Pt with afib on 10/29 with rates as high as 170s-180s. No previous documentation of afib, was on metoprolol at home but may have been for CAD rather than for afib. Received 2 doses metoprolol 5 mg and 1 dose IV lasix 40 and converted to NSR within about an hour. Suspect afib was instigated by volume overload.  -Metoprolol tartrate 12.5 mg BID  -Diuresis as above   ??  Upper extremity tremor - S/p stroke w/u for L facial droop Patient developed acute-onset left-sided facial droop and LUE weakness on day of admission 10/27. Evaluated by Neurology NIHSS 8 initially, CTH negative. 24 hours of video EEG completed and without focal seziures, only showed difuse slowing. Neurology signed off. Droop largely resolved.. Mental status seems at baseline (hx of intellectual disability). However has had pronounced upper extremity tremors new on this admission   - Continue zosyn (cefepime DCed to r/o neurotoxicity)  - HOLD duonebs in case tremor is side effect of albuterol  - Reduce centrally acting meds: hold celexa Elevated LFTs:??Characterized by mild hyperbilirubinemia and transaminase elevations.TBili first noted to be elevated on 10/20, AST/ALT/AlkPhos elevation since 10/21.??Likely drug induced. Could be related to azacitidine which can cause transient elevation of liver enzymes. Also discharged on amicare which may have exacerbated transaminitis. Hep B/C and HIV were negative in 11/2017. Most recent abdominal imaging (CT) showed hepatosplenomegaly but no evidence of cirrhosis. RUQ [place] without biliary duct dilation, showed mild hepatomegaly, s/p cholecystectomy. Liver function labs downtrending.  -Daily CMP   -Holding amicar, CTM   ??  Ileus: Dilated loops of bowel on KUB concerning for partial SBO vs ileus after transient report of abdominal pain. Pt with history of going through cycles of constipation followed by diarrhea. Sister reports he did have one dark (chronic melena) BM on 10/25 prior to presentation to hospital. Often in house has frequent BMs but with straining so will continue aggressive BM. Intermittent episodes of frequent BM followed by absence of BM.  - Holding laxatives  - C diff pending  ??  Diffuse Rash - Facial edema (resolving)- suspect mild drug allergy to vanc vs. Amicar. Swelling and itching improved with topical steroid cream, prednisone for COPD exacerbation, and 1x benadryl. Rash still present with diffuse erythematous macules on trunk but improving. Circumscribed lesions on back likely psoriatic given hx of psoriasis per chart review.  -Avoid vanc for now  -Supportive care with topical cream  -WOCN consulting  ??  L eye swelling/drainage (Stable)- Purulent drainage on admission  -Polymixin eye drops for bacterial conjuncitvitis (7 day course) Adverse Risk AML: Follows with Dr. Vertell Limber. BMBx on 01/15/2019 was hypercellular (70%) with 30% blasts. He has complex cytogenetics and TP53 mutation. He was started on Azacitdine/Venetoclax, C1D1 = 10/6. Course complicated by pancytopenia and GI bleeding. He has a poor prognosis with and without chemo. Began to discuss pt's goals of care with pt and sister- pt with poor understanding of risks vs. Benefits of chemo and only short term outlook I want to go home. Unfortunately it has been difficult to get in touch with pt's HPOA (he designates his sister [name]) to discuss long term plans and she is unable to come to bedside this weekend. Initially planned for BM biopsy on 10/30 but she denied consent when heme fellow called her. She seems to be uncertain as to whether or not it is worth continuing venetoclax vs. Having pt go home with hospice.  We will continue to try discussions with her depending on patient stability about pursuing repeat BM biopsy to guide whether or not to continue treatment vs just stopping all aggressive therapy now.   - Continue goals of care discussions re: risks vs. benefits of continuing chemo given multiple recent hospitalizations and poor prognosis as well as often tenuous respiratory status  - Cont Venetoclax (held on admission, restarted on 10/30)  - GOC -> prelim BM 5% blasts, discussing cancer plan w/ HPOA sister [name]    Chronic Medical Problems:  HTN:??Holding amlodipine 10 mg daily  CAD:??Stents last placed on 02/2016 with reported EF of 50%. Most recent heart cath on 01/2018 was unremarkable. Holding IMDUR, diuresing as above, metoprolol tartrate 12.5 mg BID in place of home metoprolol succinate 25 mg daily  Chronic Pain: Continue home pain regimen    Daily Checklist:  Diet: Regular Diet  DVT PPx: Contradindicated - Thrombocytopenic  Electrolytes: Replete PRN  Code Status: DNR and DNI  Dispo: Admit to E1    Objective:   Temp:  [36 ??C-38.1 ??C] 38.1 ??C  Heart Rate:  [59-191] 140 Resp:  [22-24] 22  BP: (80-148)/(46-83) 108/83  SpO2:  [81 %-96 %] 93 %    Gen: NAD, alert, oriented, answers questions appropriately  HEENT: atraumatic, sclera anicteric, left eyelid swollen, able to open both easily, no drainage observed  Heart: RRR  Lungs: no increased WOB on [place], CTAB  Abdomen: non-tender  Extremities: no edema  Psych: At baseline, continues to repeat when can I go home    Labs/Studies: Labs and Studies from the last 24hrs per EMR and Reviewed

## 2019-03-13 LAB — HISTOPLASMA AB, ID: Histoplasma capsulatum Ab:Prid:Pt:Ser/Plas:Nom:Immune diffusion: NEGATIVE

## 2019-03-13 LAB — COCCIDIOIDES ANTIBODIES: COCCIDIOIDES AB,IGM: NEGATIVE

## 2019-03-13 LAB — COCCIDIOIDES AB: Coccidioides immitis Ab:Titr:Pt:Ser:Qn:Comp fix: NEGATIVE

## 2019-03-17 ENCOUNTER — Encounter: Payer: Self-pay | Admitting: Internal Medicine

## 2019-03-18 ENCOUNTER — Other Ambulatory Visit: Payer: Self-pay

## 2019-03-18 NOTE — Patient Outreach (Signed)
[facility]) Care Management  03/18/2019  Nicholas Reyes 1956-01-29 [id number]   Referral Date:02/25/2018 Referral Source:[facility] ED Census Reason for Referral:6 or more ED visits in the past 6 months Insurance:[employer] Medicare & Medicaid   Outreach Attempt:Outreach #9.  No answer.  Unable to leave a message.      Plan: RN CM will attempt again in the month of  December.  [name], RN, MSN [place] Management Care Management Coordinator Direct Line [phone] Cell [phone] Toll Free: [phone]  Fax: [phone]

## 2019-03-20 ENCOUNTER — Ambulatory Visit: Payer: Self-pay

## 2019-04-01 NOTE — Unmapped (Signed)
This patient has been disenrolled from the [facility] specialty pharmacy services due to patient being in hospice.Nanetta Batty  [facility] Specialty Pharmacist

## 2019-04-05 DIAGNOSIS — J449 Chronic obstructive pulmonary disease, unspecified: Secondary | ICD-10-CM | POA: Diagnosis not present

## 2019-04-08 DEATH — deceased

## 2019-04-15 ENCOUNTER — Other Ambulatory Visit: Payer: Self-pay

## 2019-04-15 NOTE — Patient Outreach (Signed)
[facility]) Care Management  04/15/2019  Nicholas Reyes 06-17-1955 [id number]   Patient due for call but noted to be deceased on 21-Mar-2019 from records at [facility].  RN CM will close case.    [name], RN, MSN [place] Management Care Management Coordinator Direct Line [phone] Cell [phone] Toll Free: [phone]  Fax: [phone]

## 2019-07-18 ENCOUNTER — Telehealth: Payer: Self-pay | Admitting: Internal Medicine

## 2019-07-18 NOTE — Telephone Encounter (Signed)
Called pt. No answer/No vm. Pt due to schedule Medicare Annual Wellness Visit (AWV) either virtually or audio only.  No hx of AWV; please schedule at anytime with Denisa O'Brien-Blaney at [facility] in 1991

## 2020-08-16 IMAGING — CT CT ABD-PELV W/ CM
2 of 5 series · 16 of 46 positions shown, 18 images · IV contrast (iopamidol)
Comparison: 09/25/2017

CLINICAL DATA: Abdominal pain.  Right upper quadrant pain.

EXAM:
CT ABDOMEN AND PELVIS WITH CONTRAST
TECHNIQUE: Multidetector CT imaging of the abdomen and pelvis was performed
using the standard protocol following bolus administration of
intravenous contrast.
CONTRAST:  100mL [id number] IOPAMIDOL ([id number]) INJECTION 61%

[Series 2: abd pelvis · axial · 0.85mm/px · z∈[-1643,-1203]mm · 13 of 98 slices shown, 15 images (1 of 2)]
[im 5/98  soft-tissue]
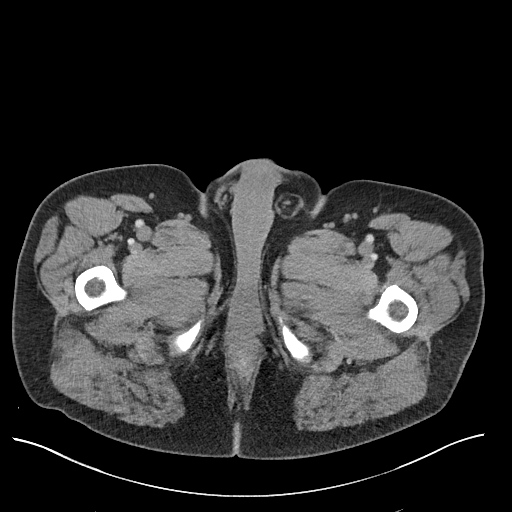
[im 5/98  bone]
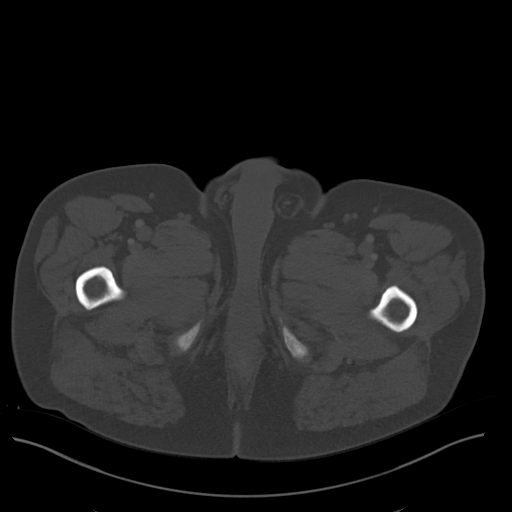
[im 15/98  soft-tissue]
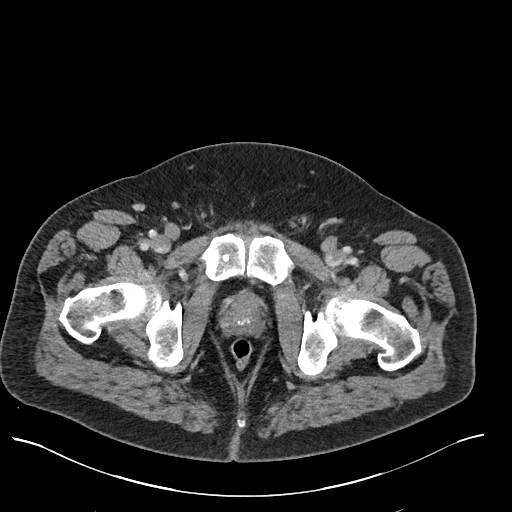
[im 20/98  soft-tissue]
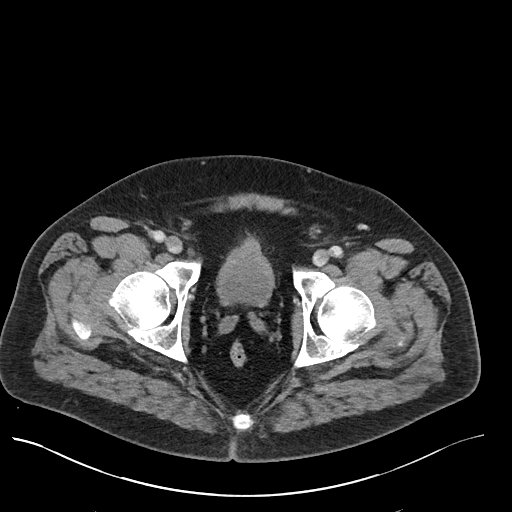
[im 30/98  soft-tissue]
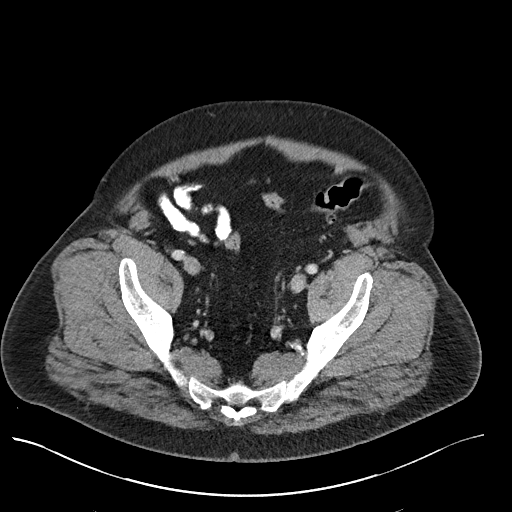
[im 34/98  soft-tissue]
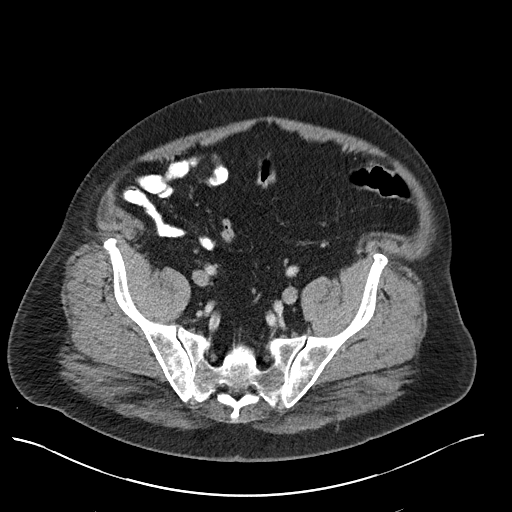
[im 44/98  soft-tissue]
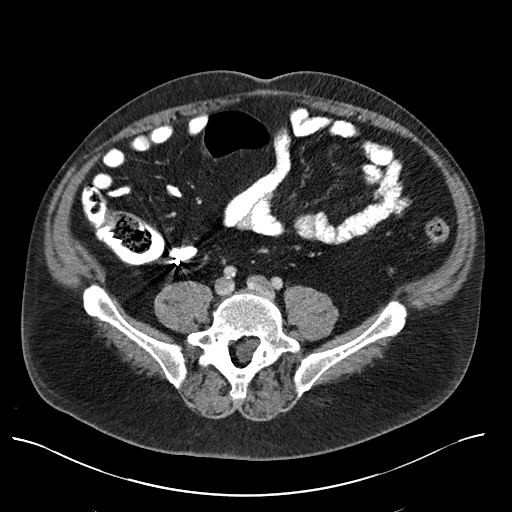
[im 49/98  soft-tissue]
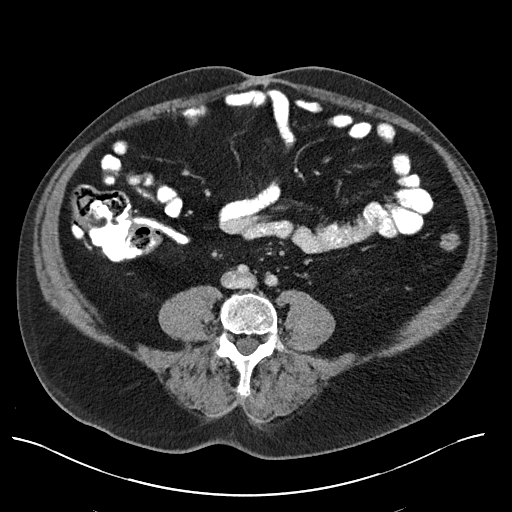
[im 54/98  soft-tissue]
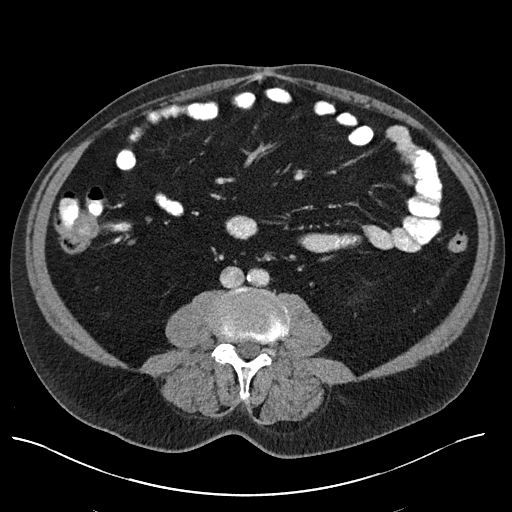
[im 64/98  soft-tissue]
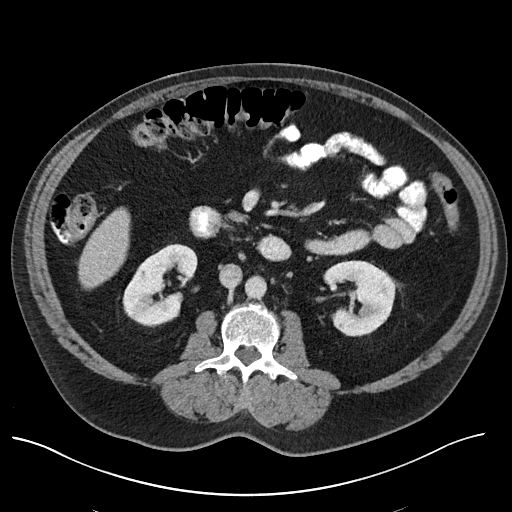
[im 64/98  bone]
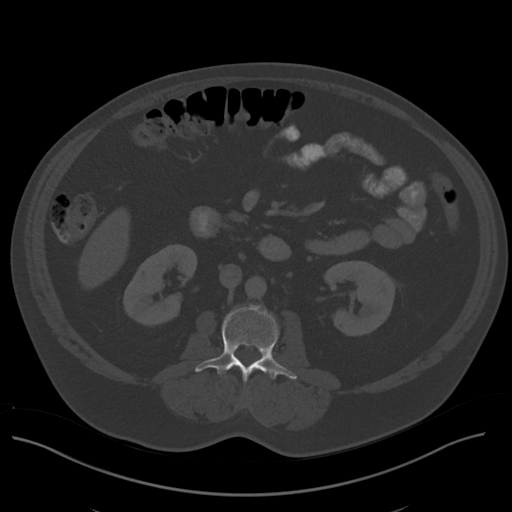
[im 68/98  soft-tissue]
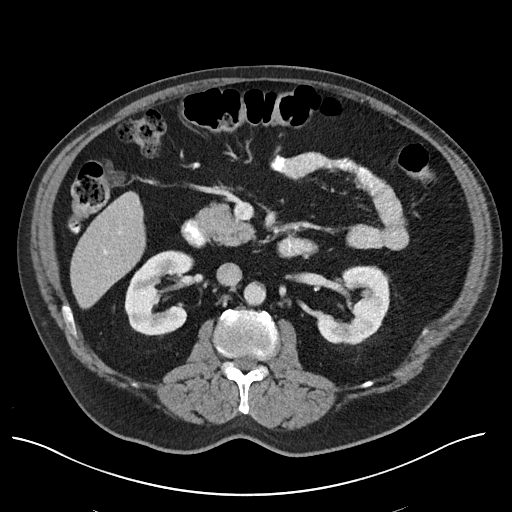
[im 78/98  soft-tissue]
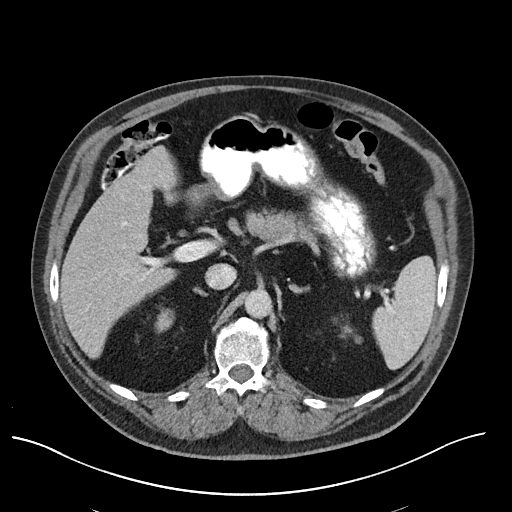
[im 83/98  soft-tissue]
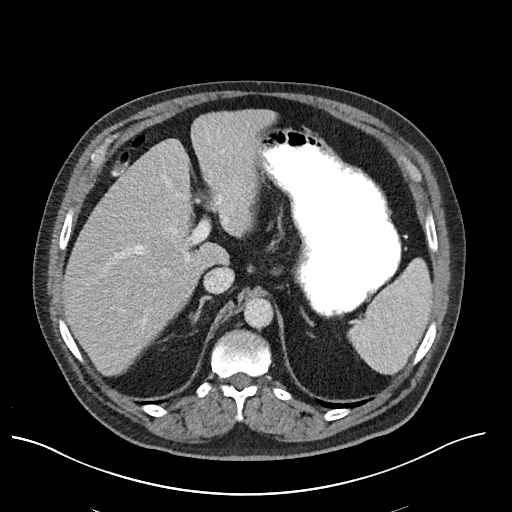
[im 93/98  soft-tissue]
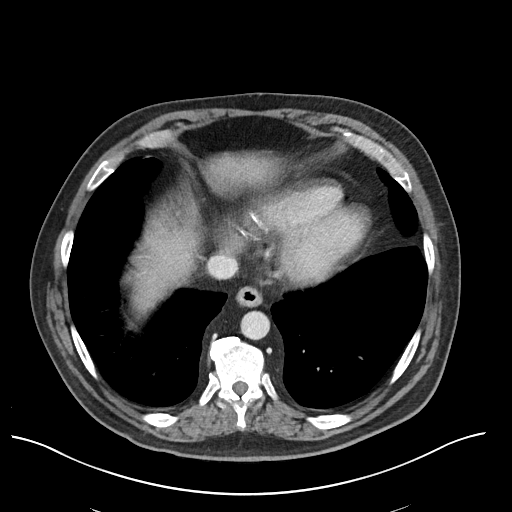

[Series 4: abd pelvis · coronal · 0.82mm/px · 3 of 175 slices shown (2 of 2)]
[im 59/175  soft-tissue]
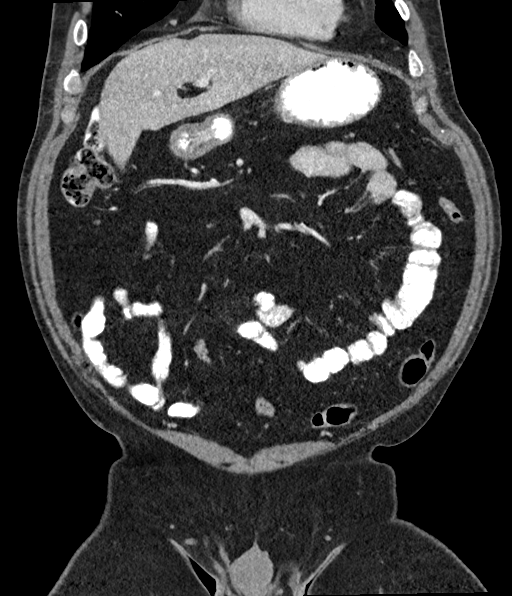
[im 78/175  soft-tissue]
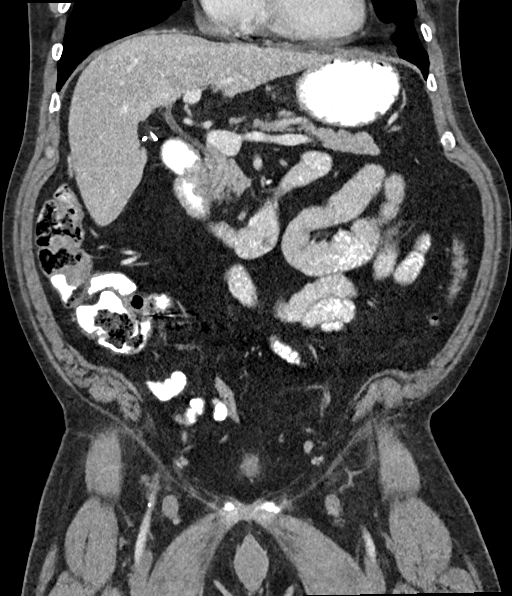
[im 97/175  soft-tissue]
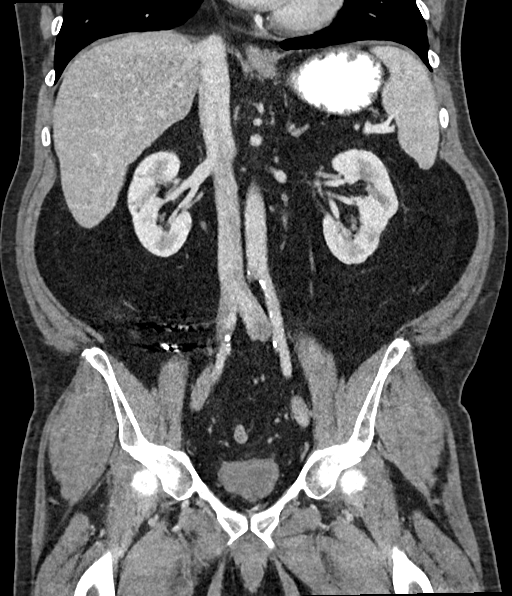

[16 of 46 positions shown; findings below may reference images not displayed]

FINDINGS: Lower chest: No acute abnormality.

Hepatobiliary: No focal liver abnormality is seen. Status post
cholecystectomy. No biliary dilatation.

Pancreas: Unremarkable. No pancreatic ductal dilatation or
surrounding inflammatory changes.

Spleen: Normal in size without focal abnormality.

Adrenals/Urinary Tract: Normal adrenal glands. 12 mm right upper
pole hypodense, fluid attenuating right renal mass most consistent
with a cyst. Small 9 mm hypodense, fluid attenuating left upper pole
renal mass most consistent with a cyst. No urolithiasis or
obstructive uropathy. Normal bladder.

Stomach/Bowel: Stomach is within normal limits. Diverticulosis
without evidence of diverticulitis. Old sequela of prior epiploic
appendagitis involving the distal descending colon. No evidence of
bowel wall thickening, distention, or inflammatory changes.

Vascular/Lymphatic: Normal caliber abdominal aorta. No abdominal
aortic at school mild abdominal aortic atherosclerosis. No
lymphadenopathy.

Reproductive: Prostate is unremarkable.

Other: No abdominal wall hernia or abnormality. No abdominopelvic
ascites.

Musculoskeletal: No acute osseous abnormality. No aggressive osseous
lesion.
IMPRESSION: 1. No acute abdominal or pelvic pathology.
2.  Aortic Atherosclerosis ([id number]).

## 2021-07-03 IMAGING — DX PORTABLE CHEST - 1 VIEW
1 series · 1 of 1 positions shown · non-contrast
Comparison: Radiographs August 05, 2018.

CLINICAL DATA: Upper abdominal pain.

EXAM:
PORTABLE CHEST 1 VIEW

[chest ap]
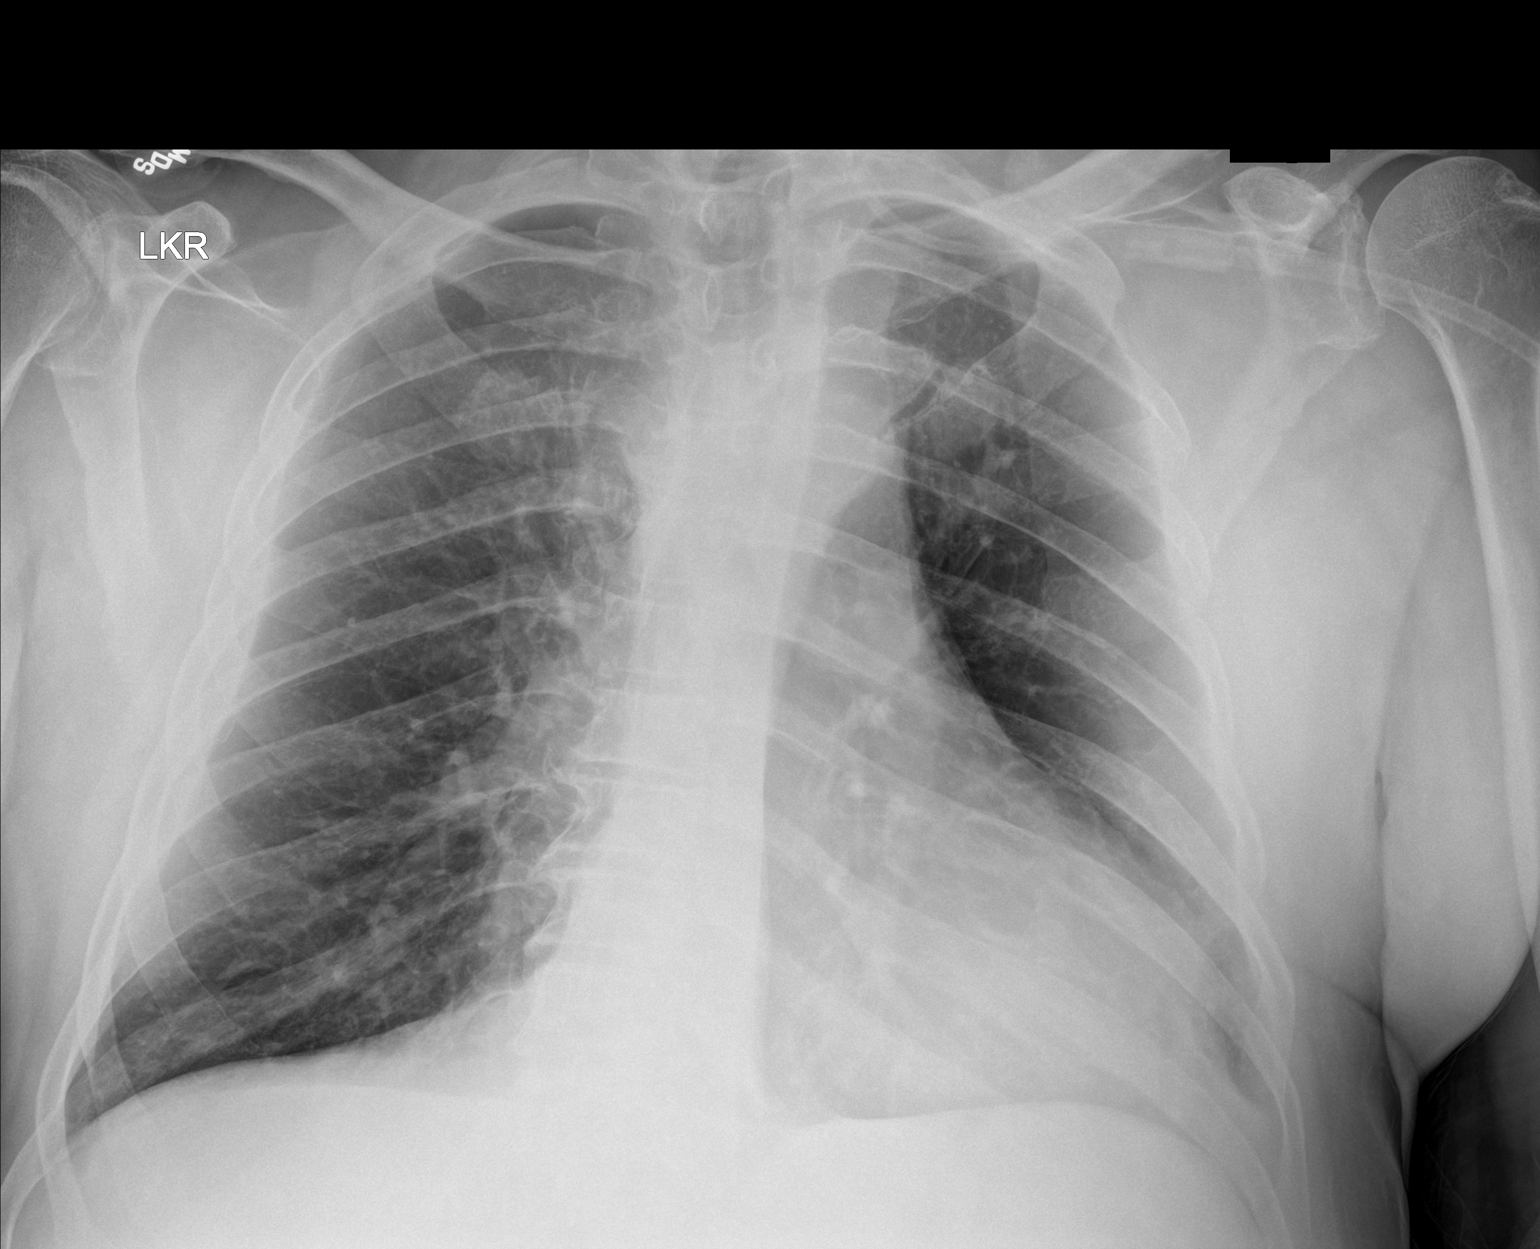

[1 of 1 positions shown; findings below may reference images not displayed]

FINDINGS: Stable cardiomediastinal silhouette. No pneumothorax or pleural
effusion is noted. Both lungs are clear. The visualized skeletal
structures are unremarkable.
IMPRESSION: No active disease.

## 2021-07-18 IMAGING — CT CT BIOPSY AND ASPIRATION BONE MARROW
1 of 2 series · 13 of 16 positions shown, 17 images · non-contrast
Comparison: none

INDICATION: 62-year-old male with thrombocytopenia

[Series 2: i-spiral 5.0 b30f · axial · 0.90mm/px · z∈[-184,-86]mm · 13 of 34 slices shown, 17 images]
[im 3/34  soft-tissue]
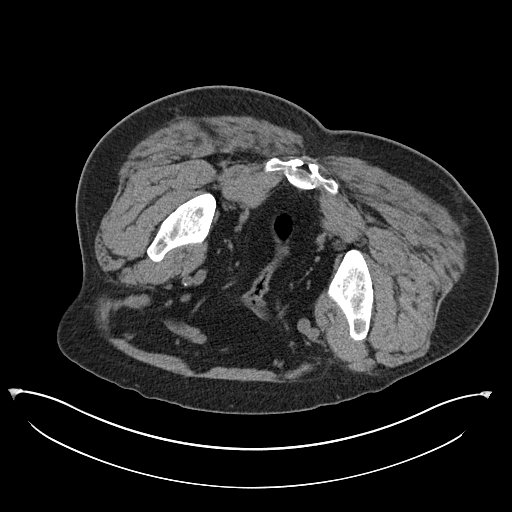
[im 3/34  bone]
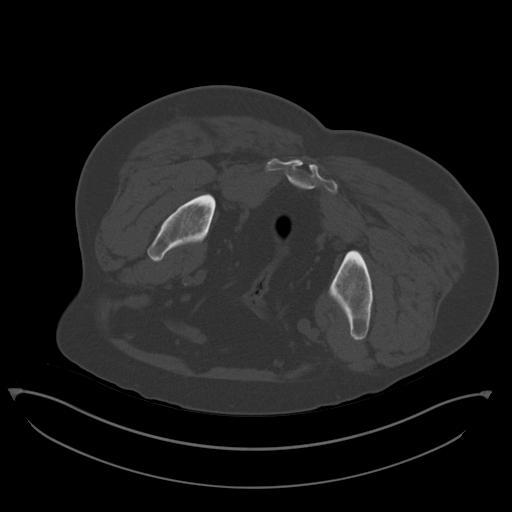
[im 5/34  soft-tissue]
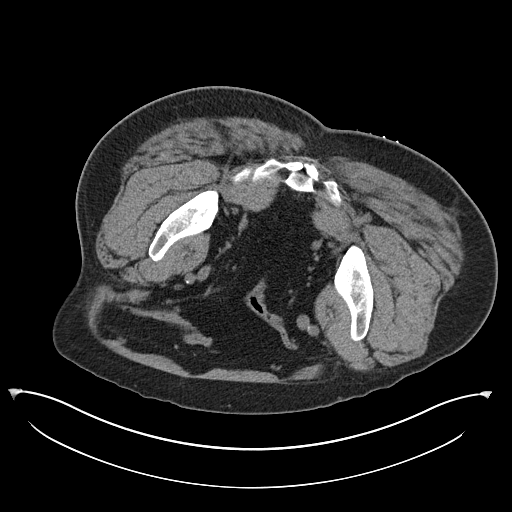
[im 8/34  soft-tissue]
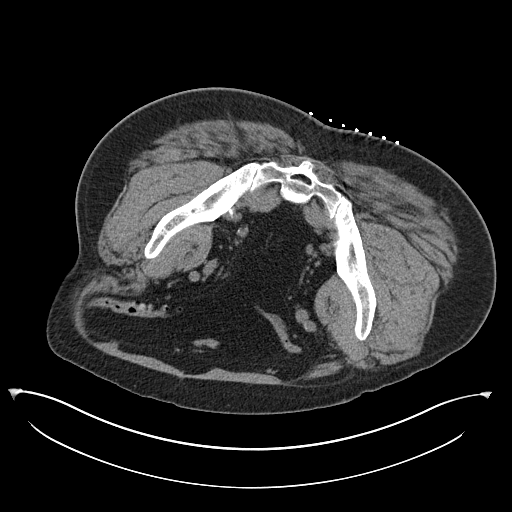
[im 10/34  soft-tissue]
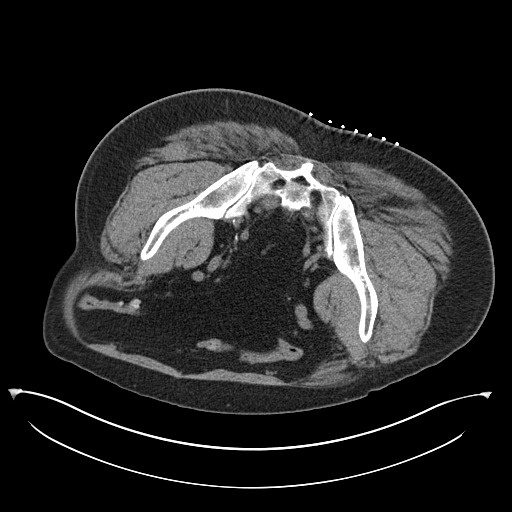
[im 12/34  soft-tissue]
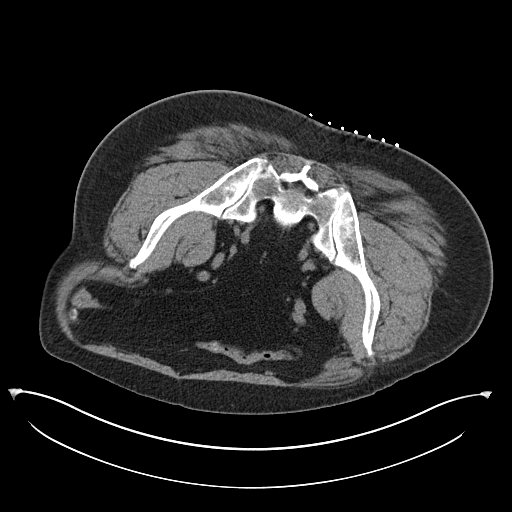
[im 12/34  bone]
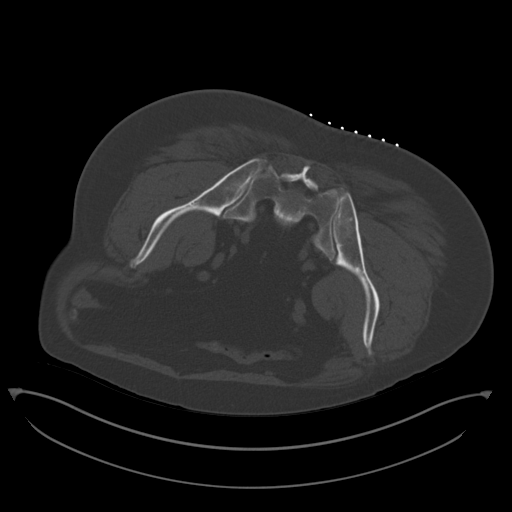
[im 15/34  soft-tissue]
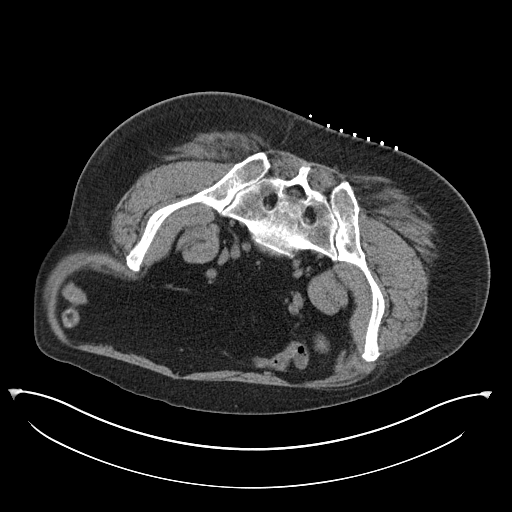
[im 17/34  soft-tissue]
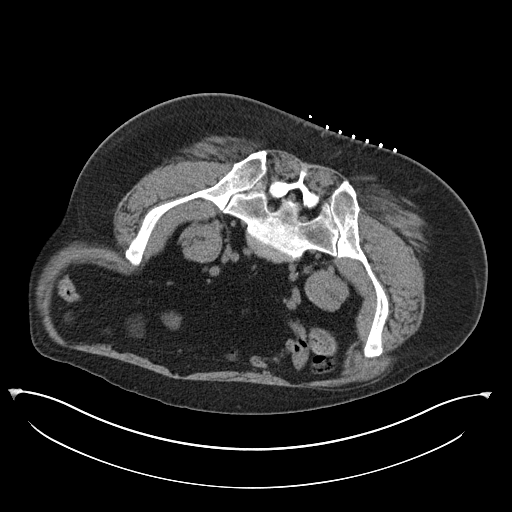
[im 19/34  soft-tissue]
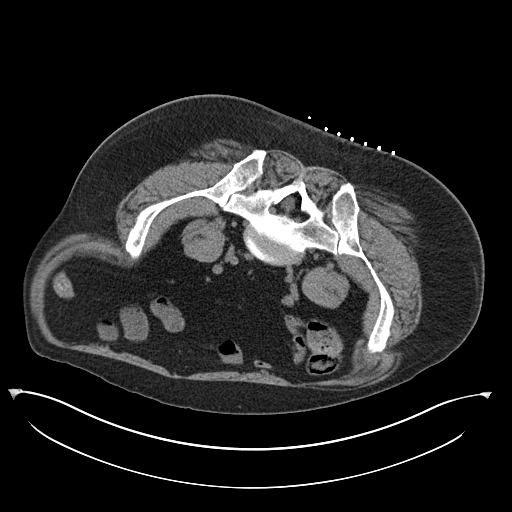
[im 22/34  soft-tissue]
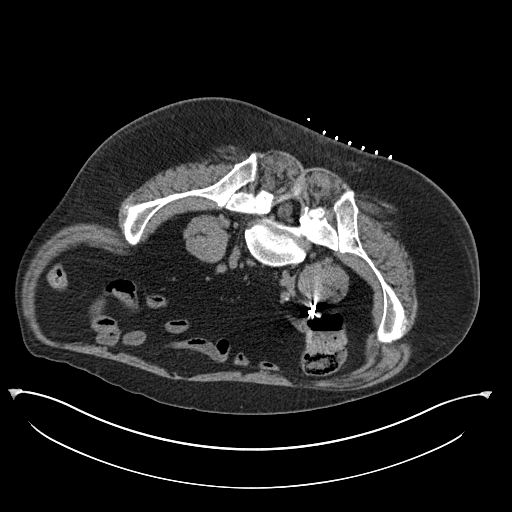
[im 22/34  bone]
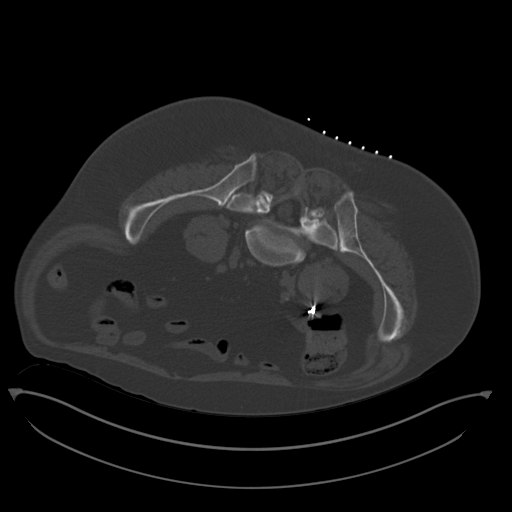
[im 24/34  soft-tissue]
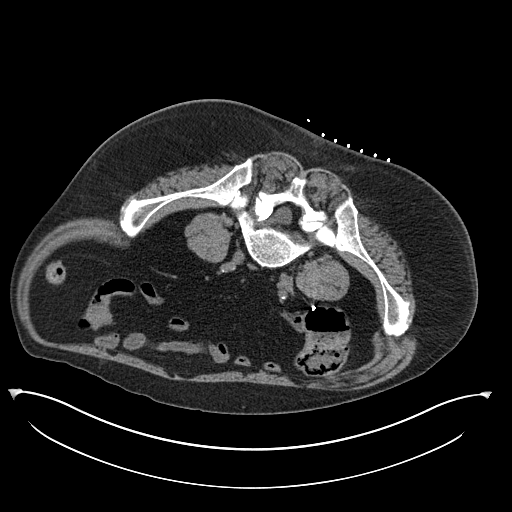
[im 26/34  soft-tissue]
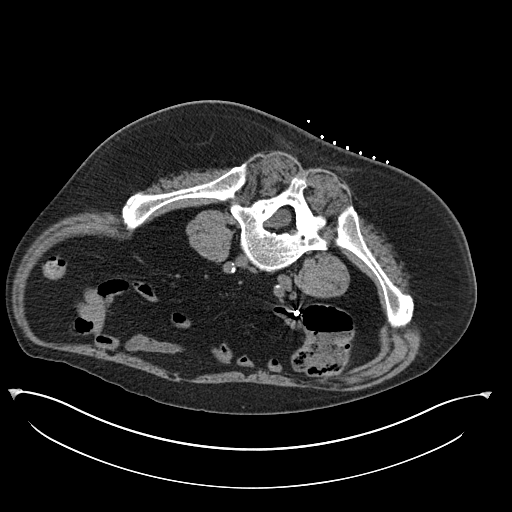
[im 29/34  soft-tissue]
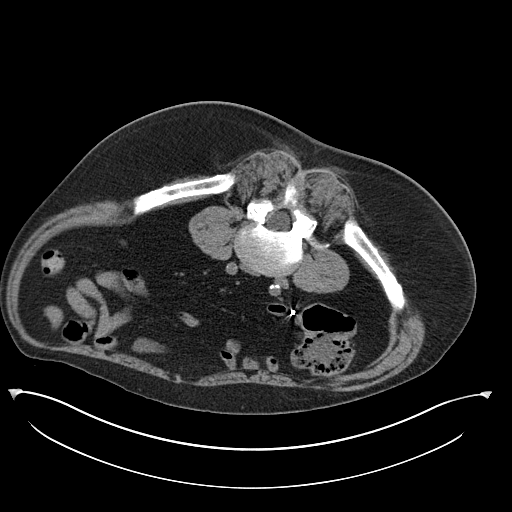
[im 31/34  soft-tissue]
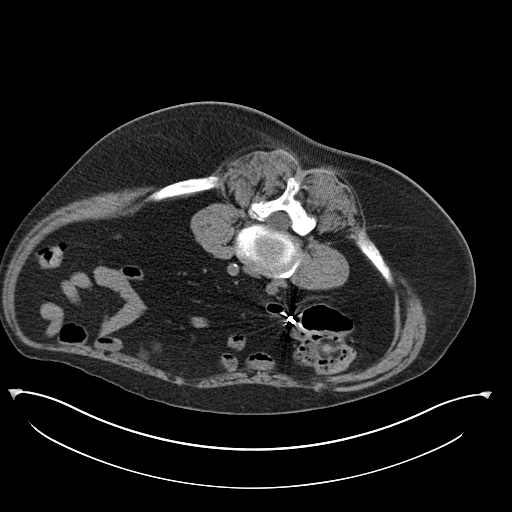
[im 31/34  bone]
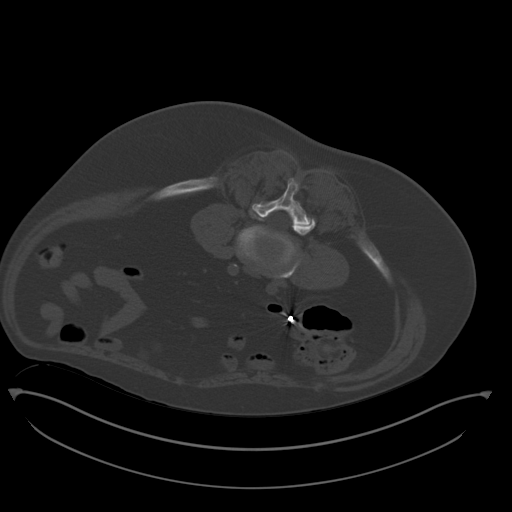

[13 of 16 positions shown; findings below may reference images not displayed]

EXAM:
CT BONE MARROW BIOPSY AND ASPIRATION

MEDICATIONS:
None.

ANESTHESIA/SEDATION:
Moderate (conscious) sedation was employed during this procedure. A
total of Versed 1.0 mg and Fentanyl 75 mcg was administered
intravenously.

Moderate Sedation Time: 11 minutes. The patient's level of
consciousness and vital signs were monitored continuously by
radiology nursing throughout the procedure under my direct
supervision.

FLUOROSCOPY TIME:  CT

COMPLICATIONS:
None

PROCEDURE:
The procedure risks, benefits, and alternatives were explained to
the patient. Questions regarding the procedure were encouraged and
answered. The patient understands and consents to the procedure.

Scout CT of the pelvis was performed for surgical planning purposes.

The right posterior pelvis was prepped with Chlorhexidine in a
sterile fashion, and a sterile drape was applied covering the
operative field. A sterile gown and sterile gloves were used for the
procedure. Local anesthesia was provided with 1% Lidocaine.

Posterior right iliac bone was targeted for biopsy. The skin and
subcutaneous tissues were infiltrated with 1% lidocaine without
epinephrine. A small stab incision was made with an 11 blade
scalpel, and an 11 gauge Hanou needle was advanced with CT guidance
to the posterior cortex. Manual forced was used to advance the
needle through the posterior cortex and the stylet was removed. A
bone marrow aspirate was retrieved and passed to a cytotechnologist
in the room. The Hanou needle was then advanced without the stylet
for a core biopsy. The core biopsy was retrieved and also passed to
a cytotechnologist.

Manual pressure was used for hemostasis and a sterile dressing was
placed.

No complications were encountered no significant blood loss was
encountered.

Patient tolerated the procedure well. Normal hemodynamics, with 1
episode of hypoxia registering 80-85% which resolved immediately
with stimulation from the procedure.
IMPRESSION: Status post CT-guided bone marrow biopsy, with tissue specimen sent
to pathology for complete histopathologic analysis
